# Patient Record
Sex: Male | Born: 1940
Health system: Southern US, Community
[De-identification: ages and names within clinical notes are randomized; demographics above are authoritative.]

## PROBLEM LIST (undated history)

## (undated) DIAGNOSIS — A389 Scarlet fever, uncomplicated: Secondary | ICD-10-CM

## (undated) DIAGNOSIS — I495 Sick sinus syndrome: Secondary | ICD-10-CM

## (undated) DIAGNOSIS — Z87442 Personal history of urinary calculi: Secondary | ICD-10-CM

## (undated) DIAGNOSIS — I4819 Other persistent atrial fibrillation: Secondary | ICD-10-CM

## (undated) DIAGNOSIS — H9190 Unspecified hearing loss, unspecified ear: Secondary | ICD-10-CM

## (undated) DIAGNOSIS — F419 Anxiety disorder, unspecified: Secondary | ICD-10-CM

## (undated) DIAGNOSIS — R413 Other amnesia: Secondary | ICD-10-CM

## (undated) DIAGNOSIS — Z889 Allergy status to unspecified drugs, medicaments and biological substances status: Secondary | ICD-10-CM

## (undated) DIAGNOSIS — K219 Gastro-esophageal reflux disease without esophagitis: Secondary | ICD-10-CM

## (undated) DIAGNOSIS — E0590 Thyrotoxicosis, unspecified without thyrotoxic crisis or storm: Secondary | ICD-10-CM

## (undated) DIAGNOSIS — K449 Diaphragmatic hernia without obstruction or gangrene: Secondary | ICD-10-CM

## (undated) DIAGNOSIS — E785 Hyperlipidemia, unspecified: Secondary | ICD-10-CM

## (undated) DIAGNOSIS — R0602 Shortness of breath: Secondary | ICD-10-CM

## (undated) DIAGNOSIS — Z8679 Personal history of other diseases of the circulatory system: Secondary | ICD-10-CM

## (undated) DIAGNOSIS — R319 Hematuria, unspecified: Secondary | ICD-10-CM

## (undated) DIAGNOSIS — R269 Unspecified abnormalities of gait and mobility: Secondary | ICD-10-CM

## (undated) DIAGNOSIS — J45909 Unspecified asthma, uncomplicated: Secondary | ICD-10-CM

## (undated) DIAGNOSIS — K4090 Unilateral inguinal hernia, without obstruction or gangrene, not specified as recurrent: Secondary | ICD-10-CM

## (undated) DIAGNOSIS — M1990 Unspecified osteoarthritis, unspecified site: Secondary | ICD-10-CM

## (undated) DIAGNOSIS — Z950 Presence of cardiac pacemaker: Secondary | ICD-10-CM

## (undated) DIAGNOSIS — I10 Essential (primary) hypertension: Secondary | ICD-10-CM

## (undated) DIAGNOSIS — I2510 Atherosclerotic heart disease of native coronary artery without angina pectoris: Secondary | ICD-10-CM

## (undated) DIAGNOSIS — I350 Nonrheumatic aortic (valve) stenosis: Secondary | ICD-10-CM

## (undated) HISTORY — DX: Sick sinus syndrome: I49.5

## (undated) HISTORY — PX: INSERT / REPLACE / REMOVE PACEMAKER: SUR710

## (undated) HISTORY — DX: Essential (primary) hypertension: I10

## (undated) HISTORY — DX: Unspecified hearing loss, unspecified ear: H91.90

## (undated) HISTORY — DX: Personal history of other diseases of the circulatory system: Z86.79

## (undated) HISTORY — DX: Unilateral inguinal hernia, without obstruction or gangrene, not specified as recurrent: K40.90

## (undated) HISTORY — DX: Hyperlipidemia, unspecified: E78.5

## (undated) HISTORY — DX: Atherosclerotic heart disease of native coronary artery without angina pectoris: I25.10

## (undated) HISTORY — DX: Nonrheumatic aortic (valve) stenosis: I35.0

## (undated) HISTORY — DX: Other amnesia: R41.3

## (undated) HISTORY — DX: Thyrotoxicosis, unspecified without thyrotoxic crisis or storm: E05.90

## (undated) HISTORY — PX: COLONOSCOPY W/ POLYPECTOMY: SHX1380

## (undated) HISTORY — DX: Other persistent atrial fibrillation: I48.19

## (undated) HISTORY — PX: DENTAL SURGERY: SHX609

## (undated) HISTORY — DX: Hematuria, unspecified: R31.9

## (undated) HISTORY — DX: Diaphragmatic hernia without obstruction or gangrene: K44.9

## (undated) HISTORY — DX: Unspecified abnormalities of gait and mobility: R26.9

---

## 1982-05-18 HISTORY — PX: KIDNEY STONE SURGERY: SHX686

## 2002-05-02 ENCOUNTER — Ambulatory Visit (HOSPITAL_COMMUNITY): Admission: RE | Admit: 2002-05-02 | Discharge: 2002-05-02 | Payer: Self-pay | Admitting: *Deleted

## 2004-04-28 ENCOUNTER — Encounter (INDEPENDENT_AMBULATORY_CARE_PROVIDER_SITE_OTHER): Payer: Self-pay | Admitting: *Deleted

## 2004-04-28 ENCOUNTER — Ambulatory Visit (HOSPITAL_COMMUNITY): Admission: RE | Admit: 2004-04-28 | Discharge: 2004-04-28 | Payer: Self-pay | Admitting: *Deleted

## 2004-05-18 HISTORY — PX: CORONARY ARTERY BYPASS GRAFT: SHX141

## 2004-05-18 HISTORY — PX: AORTIC VALVE REPLACEMENT: SHX41

## 2004-09-19 ENCOUNTER — Ambulatory Visit (HOSPITAL_COMMUNITY): Admission: RE | Admit: 2004-09-19 | Discharge: 2004-09-19 | Payer: Self-pay | Admitting: Cardiovascular Disease

## 2004-10-02 ENCOUNTER — Inpatient Hospital Stay (HOSPITAL_COMMUNITY): Admission: RE | Admit: 2004-10-02 | Discharge: 2004-10-15 | Payer: Self-pay | Admitting: Surgery

## 2004-10-02 ENCOUNTER — Encounter (INDEPENDENT_AMBULATORY_CARE_PROVIDER_SITE_OTHER): Payer: Self-pay | Admitting: Specialist

## 2004-11-03 ENCOUNTER — Encounter (HOSPITAL_COMMUNITY): Admission: RE | Admit: 2004-11-03 | Discharge: 2005-02-01 | Payer: Self-pay | Admitting: Cardiovascular Disease

## 2009-12-14 ENCOUNTER — Emergency Department (HOSPITAL_COMMUNITY): Admission: EM | Admit: 2009-12-14 | Discharge: 2009-12-14 | Payer: Self-pay | Admitting: Emergency Medicine

## 2009-12-23 ENCOUNTER — Ambulatory Visit: Payer: Self-pay | Admitting: Cardiovascular Disease

## 2009-12-23 ENCOUNTER — Emergency Department (HOSPITAL_COMMUNITY): Admission: EM | Admit: 2009-12-23 | Discharge: 2009-12-23 | Payer: Self-pay | Admitting: Emergency Medicine

## 2009-12-26 ENCOUNTER — Ambulatory Visit: Payer: Self-pay | Admitting: Cardiovascular Disease

## 2010-01-06 ENCOUNTER — Ambulatory Visit: Payer: Self-pay | Admitting: Cardiovascular Disease

## 2010-01-17 ENCOUNTER — Ambulatory Visit: Payer: Self-pay | Admitting: Cardiovascular Disease

## 2010-02-17 ENCOUNTER — Ambulatory Visit: Payer: Self-pay | Admitting: Cardiovascular Disease

## 2010-03-01 ENCOUNTER — Encounter: Payer: Self-pay | Admitting: Internal Medicine

## 2010-04-02 ENCOUNTER — Ambulatory Visit: Payer: Self-pay | Admitting: Cardiovascular Disease

## 2010-04-29 ENCOUNTER — Ambulatory Visit: Payer: Self-pay | Admitting: Cardiology

## 2010-06-04 DIAGNOSIS — K449 Diaphragmatic hernia without obstruction or gangrene: Secondary | ICD-10-CM | POA: Insufficient documentation

## 2010-06-04 DIAGNOSIS — E0590 Thyrotoxicosis, unspecified without thyrotoxic crisis or storm: Secondary | ICD-10-CM | POA: Insufficient documentation

## 2010-06-04 DIAGNOSIS — I495 Sick sinus syndrome: Secondary | ICD-10-CM | POA: Insufficient documentation

## 2010-06-04 DIAGNOSIS — R079 Chest pain, unspecified: Secondary | ICD-10-CM | POA: Insufficient documentation

## 2010-06-04 DIAGNOSIS — R0602 Shortness of breath: Secondary | ICD-10-CM | POA: Insufficient documentation

## 2010-06-04 DIAGNOSIS — I2510 Atherosclerotic heart disease of native coronary artery without angina pectoris: Secondary | ICD-10-CM | POA: Insufficient documentation

## 2010-06-04 DIAGNOSIS — B159 Hepatitis A without hepatic coma: Secondary | ICD-10-CM | POA: Insufficient documentation

## 2010-06-04 DIAGNOSIS — I359 Nonrheumatic aortic valve disorder, unspecified: Secondary | ICD-10-CM | POA: Insufficient documentation

## 2010-06-04 DIAGNOSIS — I10 Essential (primary) hypertension: Secondary | ICD-10-CM | POA: Insufficient documentation

## 2010-06-04 DIAGNOSIS — E785 Hyperlipidemia, unspecified: Secondary | ICD-10-CM | POA: Insufficient documentation

## 2010-06-05 ENCOUNTER — Encounter: Payer: Self-pay | Admitting: Internal Medicine

## 2010-06-05 ENCOUNTER — Ambulatory Visit
Admission: RE | Admit: 2010-06-05 | Discharge: 2010-06-05 | Payer: Self-pay | Source: Home / Self Care | Attending: Internal Medicine | Admitting: Internal Medicine

## 2010-06-08 ENCOUNTER — Encounter: Payer: Self-pay | Admitting: Surgery

## 2010-06-10 ENCOUNTER — Ambulatory Visit: Payer: Self-pay | Admitting: Cardiovascular Disease

## 2010-06-17 NOTE — Miscellaneous (Signed)
Summary: Device preload  Clinical Lists Changes  Observations: Added new observation of PPM INDICATN: Sick sinus syndrome (03/01/2010 16:10) Added new observation of MAGNET RTE: BOL 85 ERI 65 (03/01/2010 16:10) Added new observation of PPMLEADSTAT2: active (03/01/2010 16:10) Added new observation of PPMLEADSER2: [id number] (03/01/2010 16:10) Added new observation of PPMLEADMOD2: 5076  (03/01/2010 16:10) Added new observation of PPMLEADDOI2: 08/10/2004  (03/01/2010 16:10) Added new observation of PPMLEADLOC2: RV  (03/01/2010 16:10) Added new observation of PPMLEADSTAT1: active  (03/01/2010 16:10) Added new observation of PPMLEADSER1: [id number]  (03/01/2010 16:10) Added new observation of PPMLEADMOD1: 5076  (03/01/2010 16:10) Added new observation of PPMLEADDOI1: 08/10/2004  (03/01/2010 16:10) Added new observation of PPMLEADLOC1: RA  (03/01/2010 16:10) Added new observation of PPM IMP MD: Rexanne Mano, MD  (03/01/2010 16:10) Added new observation of PPM DOI: 08/10/2004  (03/01/2010 16:10) Added new observation of PPM SERL#: [id number]  (03/01/2010 16:10) Added new observation of PPM MODL#: [id number]  (03/01/2010 16:10) Added new observation of PACEMAKERMFG: Medtronic  (03/01/2010 16:10) Added new observation of PPM REFER MD: Shaune Pascal, MD  (03/01/2010 16:10) Added new observation of PACEMAKER MD: Hillis Range, MD  (03/01/2010 16:10)      PPM Specifications Following MD:  Hillis Range, MD     Referring MD:  Shaune Pascal, MD PPM Vendor:  Medtronic     PPM Model Number:  [id number]     PPM Serial Number:  [id number] PPM DOI:  08/10/2004     PPM Implanting MD:  Rexanne Mano, MD  Lead 1    Location: RA     DOI: 08/10/2004     Model #: [id number]     Serial #: [id number]     Status: active Lead 2    Location: RV     DOI: 08/10/2004     Model #: [id number]     Serial #: [id number]     Status: active  Magnet Response Rate:  BOL 85 ERI 65  Indications:  Sick sinus syndrome

## 2010-06-19 NOTE — Assessment & Plan Note (Signed)
Summary: pacer check/medtronic   Referring Provider:  Dr Elease Hashimoto   History of Present Illness: The patient presents today to establish care in the pacemaker clinic.  He is previously s/p PPM by Dr Laneta Simmers 2006 following aortic valve replacement for sinus node dysfunction at that time.  He reports doing very well recently.  He remains very active and continues to do his own yard work.  He has a R sided tremor chronically which is his primary concern.  The patient denies symptoms of palpitations, chest pain, shortness of breath, orthopnea, PND, lower extremity edema, dizziness, presyncope, syncope, or neurologic sequela. The patient is tolerating medications without difficulties and is otherwise without complaint today.   Current Medications (verified): 1)  Synthroid 125 Mcg Tabs (Levothyroxine Sodium) .Marland Kitchen.. 1 By Mouth Daily 2)  Coumadin 5 Mg Tabs (Warfarin Sodium) .... Uad 3)  Lipitor 20 Mg Tabs (Atorvastatin Calcium) .Marland Kitchen.. 1 By Mouth Daily 4)  Metoprolol Succinate 50 Mg Xr24h-Tab (Metoprolol Succinate) .Marland Kitchen.. 1 By Mouth Daily 5)  Aspirin 81 Mg  Tabs (Aspirin) .Marland Kitchen.. 1 By Mouth Dialy 6)  Primidone 50 Mg Tabs (Primidone) .Marland Kitchen.. 1 By Mouth Daily  Allergies (verified): No Known Drug Allergies  Past History:  Past Medical History: sick sinus syndrome s/p PPM 2006 (MDT) by Dr Laneta Simmers atrial fibrillation HYPERLIPIDEMIA  HYPERTENSION  AORTIC STENOSIS s/p AVR rheumatic fever CAD  HIATAL HERNIA HYPERTHYROIDISM History of right kidney stone. decreased hearing  Past Surgical History: CAD s/p CABG and AVR 2006 by Dr Laneta Simmers  Family History: CAD  Social History:  The patient lives in [place] with his spouse.  He does not smoke and does not drink alcohol.    Review of Systems       All systems are reviewed and negative except as listed in the HPI.   Vital Signs:  Patient profile:   70 year old male Height:      71 inches Weight:      206 pounds BMI:     28.84 Pulse rate:   60 /  minute Resp:     18 per minute BP sitting:   128 / 78  (right arm)  Vitals Entered By: Marrion Coy, CNA (June 05, 2010 9:51 AM)  Physical Exam  General:  Well developed, well nourished, in no acute distress. Head:  normocephalic and atraumatic Eyes:  PERRLA/EOM intact; conjunctiva and lids normal. Mouth:  Teeth, gums and palate normal. Oral mucosa normal. Neck:  supple Chest Wall:  pacemaker pocket well healed (R sided) Lungs:  Clear bilaterally to auscultation and percussion. Heart:  RRR, mechanical S2 Abdomen:  Bowel sounds positive; abdomen soft and non-tender without masses, organomegaly, or hernias noted. No hepatosplenomegaly. Msk:  Back normal, normal gait. Muscle strength and tone normal. Extremities:  No clubbing or cyanosis. Neurologic:  profound resting tremor of R arm > R leg Skin:  Intact without lesions or rashes. Cervical Nodes:  no significant adenopathy Psych:  Normal affect.   PPM Specifications Following MD:  Hillis Range, MD     Referring MD:  Shaune Pascal, MD PPM Vendor:  Medtronic     PPM Model Number:  [id number]     PPM Serial Number:  [id number] PPM DOI:  08/10/2004     PPM Implanting MD:  Rexanne Mano, MD  Lead 1    Location: RA     DOI: 08/10/2004     Model #: 1914     Serial #: [id number]     Status: active Lead 2  Location: RV     DOI: 08/10/2004     Model #: 1610     Serial #: [id number]     Status: active  Magnet Response Rate:  BOL 85 ERI 65  Indications:  Sick sinus syndrome   PPM Follow Up Battery Voltage:  2.99 V       PPM Device Measurements Atrium  Amplitude: 3.0 mV, Impedance: 504 ohms, Threshold: 1.0 V at 0.4 msec Right Ventricle  Amplitude: 8.0 mV, Impedance: 424 ohms, Threshold: 1.0 V at 0.4 msec  Episodes MS Episodes:  0     Ventricular High Rate:  0     Atrial Pacing:  61.8%     Ventricular Pacing:  0.2%  Parameters Mode:  MVP     Lower Rate Limit:  60     Upper Rate Limit:  130 Paced AV Delay:  200     Sensed AV  Delay:  150 Next Remote Date:  09/04/2010     Next Cardiology Appt Due:  05/19/2011 Tech Comments:  NORMAL DEVICE FUNCTION.  NO EPISODES SINCE LAST CHECK.  HISTOGRAM A LITTLE FLAT BUT PT FEELS GOOD W/EXERCISE.  NO FATIGUE OR SOB.  CHANGED RV OUTPUT FROM 2.0 TO 2.5 V.  PT TO BE ENROLLED IN CARELINK.  CARELINK TRANSMISSION 09-04-10 AND ROV W/JA IN 12 MTHS. Kristin Bankston  June 05, 2010 10:00 AM MD Comments:  agree  Impression & Recommendations:  Problem # 1:  SICK SINUS SYNDROME (ICD-427.81)  normal pacemaker function no changes  His updated medication list for this problem includes:    Coumadin 5 Mg Tabs (Warfarin sodium) ..... Uad    Metoprolol Succinate 50 Mg Xr24h-tab (Metoprolol succinate) .Marland Kitchen... 1 by mouth daily    Aspirin 81 Mg Tabs (Aspirin) .Marland Kitchen... 1 by mouth dialy  Problem # 2:  HYPERTENSION (ICD-401.9)  stable  His updated medication list for this problem includes:    Metoprolol Succinate 50 Mg Xr24h-tab (Metoprolol succinate) .Marland Kitchen... 1 by mouth daily    Aspirin 81 Mg Tabs (Aspirin) .Marland Kitchen... 1 by mouth dialy  Problem # 3:  HYPERLIPIDEMIA (ICD-272.4)  stable  His updated medication list for this problem includes:    Lipitor 20 Mg Tabs (Atorvastatin calcium) .Marland Kitchen... 1 by mouth daily  Problem # 4:  CAD (ICD-414.00) no ischemic symptoms no change  Patient Instructions: 1)  Your physician recommends that you schedule a follow-up appointment in: 12 months with Dr. Johney Frame 2)  Your physician recommends that you continue on your current medications as directed. Please refer to the Current Medication list given to you today.

## 2010-07-03 NOTE — Cardiovascular Report (Signed)
Summary: Office Visit   Office Visit   Imported By: Roderic Ovens 06/25/2010 10:17:16  _____________________________________________________________________  External Attachment:    Type:   Image     Comment:   External Document

## 2010-07-11 ENCOUNTER — Other Ambulatory Visit (INDEPENDENT_AMBULATORY_CARE_PROVIDER_SITE_OTHER): Payer: BC Managed Care – PPO

## 2010-07-11 DIAGNOSIS — Z7901 Long term (current) use of anticoagulants: Secondary | ICD-10-CM

## 2010-07-11 DIAGNOSIS — I495 Sick sinus syndrome: Secondary | ICD-10-CM

## 2010-07-21 ENCOUNTER — Telehealth (INDEPENDENT_AMBULATORY_CARE_PROVIDER_SITE_OTHER): Payer: Self-pay | Admitting: *Deleted

## 2010-07-29 NOTE — Progress Notes (Signed)
Summary: question transmitting over the phone  Phone Note Call from Patient Call back at Home Phone [phone]   Caller: Spouse Reason for Call: Talk to Nurse Summary of Call: pt wife has question re transmitting over the phone. Initial call taken by: Roe Coombs,  July 21, 2010 11:02 AM  Follow-up for Phone Call        Patient has not recieved transmitter.  Re-ordered  today. Follow-up by: Altha Harm, LPN,  July 21, 2010 4:59 PM

## 2010-08-08 ENCOUNTER — Other Ambulatory Visit (INDEPENDENT_AMBULATORY_CARE_PROVIDER_SITE_OTHER): Payer: BC Managed Care – PPO | Admitting: *Deleted

## 2010-08-08 DIAGNOSIS — Z7901 Long term (current) use of anticoagulants: Secondary | ICD-10-CM

## 2010-08-08 DIAGNOSIS — I495 Sick sinus syndrome: Secondary | ICD-10-CM

## 2010-08-08 NOTE — Patient Instructions (Signed)
CONT. SAME DOSE; RECK IN 4 WEEKS;  LEFT VM ON PTS HOME #

## 2010-08-11 ENCOUNTER — Encounter: Payer: BC Managed Care – PPO | Admitting: *Deleted

## 2010-09-04 ENCOUNTER — Other Ambulatory Visit: Payer: Self-pay | Admitting: Internal Medicine

## 2010-09-04 ENCOUNTER — Ambulatory Visit (INDEPENDENT_AMBULATORY_CARE_PROVIDER_SITE_OTHER): Payer: BC Managed Care – PPO | Admitting: *Deleted

## 2010-09-04 DIAGNOSIS — I495 Sick sinus syndrome: Secondary | ICD-10-CM

## 2010-09-08 ENCOUNTER — Encounter: Payer: BC Managed Care – PPO | Admitting: *Deleted

## 2010-09-09 ENCOUNTER — Ambulatory Visit (INDEPENDENT_AMBULATORY_CARE_PROVIDER_SITE_OTHER): Payer: BC Managed Care – PPO | Admitting: *Deleted

## 2010-09-09 DIAGNOSIS — I359 Nonrheumatic aortic valve disorder, unspecified: Secondary | ICD-10-CM

## 2010-09-09 DIAGNOSIS — I350 Nonrheumatic aortic (valve) stenosis: Secondary | ICD-10-CM

## 2010-09-12 NOTE — Progress Notes (Signed)
Pacer remote check  

## 2010-09-14 ENCOUNTER — Encounter: Payer: Self-pay | Admitting: *Deleted

## 2010-09-15 ENCOUNTER — Telehealth: Payer: Self-pay | Admitting: Cardiovascular Disease

## 2010-09-15 NOTE — Telephone Encounter (Signed)
Spoke with pt's wife.  Ropinirole (Requip) has the potential to increase INR.  Pt started medication on 4/28.  Appt made to recheck INR in 5/4.

## 2010-09-15 NOTE — Telephone Encounter (Signed)
Went to neurologist on Friday he was diagnosed with parkinson, they have started him on some new meds.  ropinirole hcl .25mg  1 tablet 3 times a day for 2 weeks, 2 tablets  3 times a day. Low dose bendryl 3 times a day When should he come back in for the INR check. He does have appointment on the 22  But they will need to change it.

## 2010-09-18 ENCOUNTER — Other Ambulatory Visit: Payer: Self-pay | Admitting: *Deleted

## 2010-09-18 DIAGNOSIS — I350 Nonrheumatic aortic (valve) stenosis: Secondary | ICD-10-CM

## 2010-09-19 ENCOUNTER — Telehealth: Payer: Self-pay | Admitting: *Deleted

## 2010-09-19 ENCOUNTER — Other Ambulatory Visit: Payer: Self-pay | Admitting: Cardiovascular Disease

## 2010-09-19 ENCOUNTER — Ambulatory Visit (INDEPENDENT_AMBULATORY_CARE_PROVIDER_SITE_OTHER): Payer: BC Managed Care – PPO | Admitting: *Deleted

## 2010-09-19 DIAGNOSIS — I350 Nonrheumatic aortic (valve) stenosis: Secondary | ICD-10-CM

## 2010-09-19 DIAGNOSIS — I359 Nonrheumatic aortic valve disorder, unspecified: Secondary | ICD-10-CM

## 2010-09-19 NOTE — Telephone Encounter (Signed)
LM to call back regarding INR

## 2010-10-02 ENCOUNTER — Ambulatory Visit (INDEPENDENT_AMBULATORY_CARE_PROVIDER_SITE_OTHER): Payer: BC Managed Care – PPO | Admitting: *Deleted

## 2010-10-02 DIAGNOSIS — I359 Nonrheumatic aortic valve disorder, unspecified: Secondary | ICD-10-CM

## 2010-10-02 DIAGNOSIS — I350 Nonrheumatic aortic (valve) stenosis: Secondary | ICD-10-CM

## 2010-10-03 NOTE — Op Note (Signed)
[name], Donald [name] NO.:  [id number]   MEDICAL RECORD NO.:  [id number]          PATIENT TYPE:  INP   LOCATION:  2303                         FACILITY:  [facility]   PHYSICIAN:  Bedelia Person, M.D.        DATE OF BIRTH:  November 22, 1940   DATE OF PROCEDURE:  10/02/2004  DATE OF DISCHARGE:                                 OPERATIVE REPORT   PROCEDURE:  Intraoperative transesophageal echocardiography.   [name] is a 70 year old male with known aortic stenosis and coronary  artery disease scheduled for aortic valve repair or replacement and coronary  artery bypass grafting.  The TEE will be used intraoperatively to assess the  extent of damage to the intrinsic aortic valve as well as the repair or  replacement.  The patient was induced with general anesthesia and oral  endotracheal tube was placed to secure the airway.  The transesophageal  probe was then placed in the sleeve, heavily lubricated and passed down the  oropharynx to the 45 cm mark that remained throughout the case obtaining  various Omniplane views.  The prebypass examination revealed left ventricle  to be slightly thickened.  Contractility was slightly hypokinetic overall.  There were no segmental defects.  Left atrium was normal size.  The  appendage was clean.  The mitral valve had two leaflets, opened and closed  appropriately.  Leaflets were thin.  No calcium was noted.  Color Doppler  showed 1+ central mitral regurgitant flow.  The aortic valve was heavily  calcified.  The noncoronary cusp appeared to be moving freely.  The right  and left coronary cusps appeared fused.  There was heavy annular  calcification.  There was an insignificant amount of aortic insufficiency.  The right heart exam was essentially normal with trace tricuspid  regurgitation with Swan-Ganz catheter across the valve.  Patient was placed  on cardiopulmonary bypass and underwent aortic valve replacement with  Carpentier-Edwards  mechanical valve and coronary artery bypass grafting x2.  At the completion of the bypass procedure, there were numerous air bubbles  which were cleared with the left ventricular vent.  The post bypass  examination revealed the left ventricle to be initially slightly sluggish.  This improved to a normal prebypass examination as time passed after bypass.  There were no segmental defects detected.  The mitral valve was again  exhibiting 1+ central mitral regurgitant flow, otherwise unchanged from  prebypass exam.  The prosthetic aortic valve appeared to be well seated in  the aortic position.  There was no significant aortic insufficiency.  Two  leaflets could be seen opening and closing appropriately.  There were no  paravalvular leaks detected on color Doppler.  The septum was intact.  Right  heart exam was unchanged from prebypass.      LK/MEDQ  D:  10/02/2004  T:  10/02/2004  Job:  [id number]

## 2010-10-03 NOTE — Op Note (Signed)
[name], [name] NO.:  [id number]   MEDICAL RECORD NO.:  [id number]          PATIENT TYPE:  INP   LOCATION:  2029                         FACILITY:  [facility]   PHYSICIAN:  Evelene Croon, M.D.     DATE OF BIRTH:  21-Jul-1940   DATE OF PROCEDURE:  10/10/2004  DATE OF DISCHARGE:                                 OPERATIVE REPORT   POSTOPERATIVE DIAGNOSIS:  Six sinus syndrome.   OPERATIVE PROCEDURE:  Insertion of permanent dual chamber pacemaker.   ATTENDING SURGEON:  Evelene Croon, M.D.   ANESTHESIA:  MAC with local.   CLINICAL HISTORY:  This patient is 70 year old gentleman who is about eight  days status post aortic valve replacement with mechanical valve and coronary  bypass surgery.  He has had sick sinus syndrome postoperatively with  junctional bradycardia as well as some atrial fibrillation, atrial flutter  treated with amiodarone.  We have been watching his rhythm and normal sinus  rhythm has not returned.  After a discussion with Dr. Ninfa Meeker, it was felt  that insertion of permanent dual-chamber pacemaker would be the best  treatment to prevent symptomatic bradycardia.  I discussed the operative  procedure with the patient and his wife including alternatives, benefits,  risks including bleeding, injury the lung or subclavian vessels, infection,  malfunction of the pacemaker, pneumothorax, and they understood and agreed  to proceed.   OPERATIVE PROCEDURE:  The patient was taken off the room, placed on the  table in the supine position. He had been given preoperative intravenous  Ancef.  Then after intravenous sedation by anesthesia the chest was prepped  with Betadine soap and solution and draped in the usual sterile manner.  A  transverse incision was made in the right infraclavicular  region and  carried down through the subcutaneous tissues with electrocautery.  Local  anesthesia we used was 1% lidocaine with epinephrine since the patient has  been on  Coumadin.  I thought this would decrease bleeding.  Then a  subcutaneous pocket was developed below this incision for the pacemaker  generator.  Then the right subclavian vein was cannulated with a needle.  The subclavian vein was somewhat difficult to localize.  Then a guidewire  was advanced to the through the needle under fluoroscopic guidance into the  right side the heart.  Then the subclavian vein was cannulated a second time  with a needle and another guidewire vein from the right side of the heart  under fluoroscopic guidance.  Then over the first wire a 7-French introducer  was inserted under fluoroscopic guidance.  Through this a Medtronic  ventricular pacing lead, model number 5076 -58, serial number [id number] was  inserted.  This was a 58 cm lead with an active fixation set.  It was  positioned with the tip right ventricular apex.  It was tested and had an  impedence of 72 ohms.  HMS. Voltage was 0.5 and a current of 1 mA.  R-wave  sensing was 12.1 mV.  This lead was then screwed into place and tested again  and was stable.  It  was fixed to the pectoralis fascia using the suture  sleeve. Then the second guidewire, another 7-French introducer was inserted  under fluoroscopic guidance and through this a Medtronic active fixation  atrial lead was inserted.  This had model number [id number], serial number  [id number] as in victor  This lead was positioned with the tip against the  lateral right temperature against the lateral right atrial wall. The patient  had done an atrial flutter wire while we were putting this lead in and  therefore we could not pace were previously an and therefore we could not  pace but the P-wave sensing was 3.0 mV and impedance was 638 also HMS. T his  lead was felt to satisfactory and was affixed to the pectoralis six the  pectoralis fascia using the suture sleeve.  Then both leads were connected  to a Medtronic pacemaker generator model number [id number],  serial number  [id number] and Renae Fickle and an ACT is a pulse [id number].  The generator was placed  in the subcutaneous pocket. This complete hemostasis.  The subcutaneous C  fascia was closed with continuous 2-0 Vicryl and skin with 3-0 Vicryl.   CUTICULAR:  Closure. The sponge, needles and counts correct corresponds. Dry  sterile dressing applied and the incisions , around the chest tubes which  were Pleur-Evac suction. The patient remained hemodynamically stable and was  transfer to post anesthesia care unit in good condition. The sling in the  fascia.       BB/MEDQ  D:  10/10/2004  T:  10/11/2004  Job:  [id number]   cc:   CVTS Office   Vesta Mixer, M.D.  [address]  [place]  [place] [address]  Fax: [phone]   CVTS office

## 2010-10-03 NOTE — Cardiovascular Report (Signed)
NAME:  Donald [name], Donald [name] NO.:  [id number]   MEDICAL RECORD NO.:  [id number]          PATIENT TYPE:  OIB   LOCATION:  2899                         FACILITY:  [facility]   PHYSICIAN:  Vesta Mixer, M.D. DATE OF BIRTH:  01/13/1941   DATE OF PROCEDURE:  09/19/2004  DATE OF DISCHARGE:                              CARDIAC CATHETERIZATION   Donald [name] is a 70 year old gentleman with a history of mild aortic stenosis  in the past.  He was referred to our office for progressive shortness breath  over the past several weeks.  He had a stress Cardiolite study which was  borderline positive.  He had deep ST segment depressions during the stress  test but the cineangiographic images revealed no evidence of ischemia.  He  had a mildly depressed left ventricular systolic function with an ejection  fraction of 49%.  He was referred for heart catheterization based on these  results.   PROCEDURE:  Right left heart catheterization with coronary angiography.   The right femoral artery and right femoral vein were easily cannulated using  a modified Seldinger technique.  A right heart catheter was placed.  We  performed Fick cardiac outputs and thermodilution cardiac outputs in the  standard fashion.   HEMODYNAMICS:  The RA pressure is 6.  RV pressure is 44/10.  Pulmonary  capillary wedge pressure is a mean of 13.  Pulmonary artery pressure is  36/10 with a mean of 24.  His LV pressure was 183/21 with an aortic pressure  of 140/69.  During the aortic valve pullback the LV pressure was 190/24 with  an aortic pressure of 141/71.   Cardiac output by Fick is 5.5 L/minute.  The cardiac output by  thermodilution is 7 L/minute.  The aortic valve area by Fick cardiac output  is 0.76.  Aortic valve area by thermodilution is 0.97.   ANGIOGRAPHY:   LEFT VENTRICULOGRAM:  The left ventriculogram was performed in the 30 [name]  position.  It reveals a the left ventricular systolic function that is  at  the lower limits of normal.  The EF is approximately 50%.  The aortic valve  is seen to be very heavily calcified.  It was fairly difficult to cross the  valve and the valve was crossed using an Amplatz left 2 catheter.   Left main:  The left main coronary artery is fairly normal.   Left anterior descending artery:  There is a 50% shelf-like stenosis in the  proximal LAD.  The remainder of the LAD has only minor luminal  irregularities.  There is several moderate sized diagonal diagonal branches  which are unremarkable.   The left circumflex artery is a large and dominant vessel.  There is a  napkin ring stenosis of about 60% in the mid/distal left circumflex artery  just after the takeoff of the first obtuse marginal artery.  This stenosis  is somewhat difficult to assess but it is at least 60%.   The posterior descending artery and the posterolateral segment artery have  only minor luminal irregularities.  The first obtuse marginal artery  has  minor stenosis at the origin, but is otherwise unremarkable.   The right coronary artery is small and nondominant.  It supplies RV marginal  branches only.   COMPLICATIONS:  None.   CONCLUSION:  1.  Moderate coronary artery disease.  His coronary arteries have stenoses      primarily in the proximal, left anterior descending, and circumflex      artery.  2.  Severe aortic stenosis with an estimated aortic valve area of between      0.76 and 0.97.  He will quite likely need aortic valve replacement and I      suspect that we will want to go ahead and placed bypass grafts to his      left anterior descending and left circumflex artery.  We will discuss      the case with the CVTS.      PJN/MEDQ  D:  09/19/2004  T:  09/19/2004  Job:  [id number]   cc:   Evelene Croon, M.D.  [address]  [place]  [place] [address]  Fax: [phone]   Gaspar Garbe, M.D.  [address]  [place]  [place] [address]  Fax: [phone]

## 2010-10-03 NOTE — Op Note (Signed)
NAMEDARIS, HARKINS NO.:  [id number]   MEDICAL RECORD NO.:  [id number]          PATIENT TYPE:  INP   LOCATION:  2303                         FACILITY:  [facility]   PHYSICIAN:  Evelene Croon, M.D.     DATE OF BIRTH:  1940-08-26   DATE OF PROCEDURE:  10/02/2004  DATE OF DISCHARGE:                                 OPERATIVE REPORT   PREOPERATIVE DIAGNOSES:  1.  Severe aortic stenosis.  2.  Two-vessel coronary disease.   POSTOPERATIVE DIAGNOSES:  1.  Severe aortic stenosis.  2.  Two-vessel coronary disease.   PROCEDURE:  Median sternotomy, extracorporeal circulation, coronary bypass  graft surgery x3 using a left internal mammary artery graft to left anterior  descending coronary, with a sequential saphenous vein graft to the obtuse  marginal and posterior descending branch of the left circumflex coronary.  Aortic valve replacement using a 25-mm St. Jude mechanical valve. Endoscopic  vein harvesting from the right leg.   ATTENDING SURGEON:  Evelene Croon, M.D.   ASSISTANT:  Salvatore Decent. Dorris Fetch, M.D.   SECOND ASSISTANT:  Ammie Ferrier, P.A.-C.   ANESTHESIA:  General endotracheal.   CLINICAL HISTORY:  This patient is a 70 year old gentleman who has been in  previously good health and recently presented with new onset of substernal  chest discomfort and shortness of breath. This typically occurred with heavy  exertion. He underwent Cardiolite stress test which showed 2-3 mm of ST  depression in the inferior and lateral leads with peak exercise. The  Cardiolite images showed attenuation of the anterior wall with normal uptake  in the remaining areas. Ejection fraction was 49%. He also had a known  history of mild aortic stenosis. He underwent cardiac catheterization on Sep 19, 2004 which showed severe aortic stenosis with an aortic valve area 0.76  to 0.97 cm2 and an aortic valve gradient of 49. Coronary angiography showed  a 50% shelf-like stenosis in the  proximal LAD. Left circumflex was a large  dominant vessel that had 60% mid vessel stenosis at the takeoff of the first  obtuse marginal branch. The right coronary was a small nondominant vessel.  Ejection fraction was 50% with a heavily calcified aortic valve. After  review of the angiogram and examination the patient, it was felt that  coronary bypass graft surgery and aortic valve replacement was the best  treatment. I discussed the operative procedure with the patient and his wife  including alternatives, benefits, and risks including bleeding, blood  transfusion, infection, stroke, myocardial infarction, graft failure, and  death. We also discussed the options for valve replacement including  mechanical and tissue valve. I have discussed the pros and cons of both. The  patient decided that he would rather have a mechanical valve which I think  is a good choice at his age. He understood the need for lifelong  anticoagulation with Coumadin and the slightly increased risk of bleeding  and thromboembolism with mechanical valve over his lifetime. He understood  all this and agreed to proceed.   OPERATIVE PROCEDURE:  The patient was taken to the operating room  and placed  on the table in supine position. After induction of general endotracheal  anesthesia, a Foley catheter was placed in the bladder using sterile  technique. Then, the chest, abdomen and both lower extremities were prepped  and draped in usual sterile manner. Transesophageal echocardiogram was  performed showed a functionally bicuspid valve. There was marked  calcification of the aortic valve and severe stenosis. There was no  significant mitral regurgitation. Left ventricular function appeared well-  preserved.   Then, the chest was opened through a median sternotomy incision and the  pericardial opened in midline. Examination of the heart showed good  ventricular contractility. The ascending aorta had no palpable plaques  in  it.   The left internal mammary artery was harvested from the chest wall as a  pedicle graft. This is a medium caliber vessel with excellent blood flow  through it. At the same time, a segment of greater saphenous vein was  harvested from the right leg using endoscopic vein harvest technique. This  vein was of large caliber and good quality.   Then the patient was heparinized, and when an adequate __________ was  achieved, the distal ascending aorta was cannulated using 20-French aortic  cannula for arterial inflow. Venous outflow was achieved using a two-stage  venous cannula through the right atrial appendage. Antegrade cardioplegia  and vent cannula was inserted in the aortic root. A left ventricular vent  was placed through the right superior pulmonary vein and a retrograde  cardioplegia cannula inserted through the right atrium.   The patient placed on cardiopulmonary bypass and distal coronaries  identified. The LAD was a large vessel. The first marginal branch was a  large vessel. The left circumflex terminated to two distal branches, one  being a posterolateral branch and the most distal one being the posterior  descending artery. The posterior descending was a medium size vessel. Both  of these branches had no significant disease in them. The right coronary  artery was a small nondominant vessel that really only supplied the right  ventricle and into the acute margin.   Then, the aorta was cross clamped and 500 cc of cold blood antegrade  cardioplegia was administered in the aortic root with quick arrest of the  heart. Systemic hypothermia to 20 degrees centigrade and topical __________  was used. Temperature probe placed in the septum and inside the pad in the  pericardium. The first distal anastomosis was performed to the obtuse  marginal branch. The internal diameter is 1.75 mm. Conduit used was a  segment of greater saphenous vein. Anastomosis performed in a  sequential side-to-side manner using continuous 7-0 Prolene suture. Flow was noted  through the graft and was excellent.   The second distal anastomosis was performed of the posterior descending  branch of the left circumflex coronary artery. The internal diameter was  about 1.6 mm. Conduit used was the same segment of greater saphenous vein,  anastomosis performed in a sequential end-to-side manner using continuous 7-  0 Prolene suture. Flow was noted through the graft and was excellent.  Another dose of cardioplegia given down the vein graft and aortic root.   The third distal anastomosis was performed in the mid portion of left  anterior descending coronary artery. The internal diameter was about 2 mm.  Conduit used was the left internal mammary graft and was brought through an  opening in the left pericardium and anterior to the phrenic nerve.  Anastomosed the LAD in end-to-side manner using  a continuous 8-0 Prolene  suture. The pedicle was sutured to the epicardium with 6-0 Prolene sutures.   Then, attention was turned to the aortic valve replacement. Throughout the  remainder of the procedure, doses of retrograde cardioplegia were given in  about 30-minute intervals to maintain myocardial temperature around 10  degrees centigrade. The aorta was then opened transversely valve about 1 cm  above the takeoff of the right coronary artery. Examination of the native  valve showed that there were three leaflets with complete fusion of the  commissure between the right and left cusp and partial fusion of the  commissure between the right and noncoronary cusp. There was severe  stenosis. The native valve was excised, taking care to remove all  particulate debris. Then, the annulus was decalcified using rongeurs. The  left ventricle and aortic root were irrigated with iced saline solution.  Care was taken to remove all particulate debris. Annulus was sized, and a 25-  mm St. Jude mechanical  valve was chosen. This had model #25AJ-501, serial  [id number]. Then a series of pledgeted 2-0 Ethibond horizontal mattress  sutures were placed around the annulus with the pledgets in a subannular  position. The sutures were placed through the sewing ring and the valve  lowered into place. The sutures were tied sequentially. The valve seated  nicely and in the leaflets moved normally without obstruction. Then the  patient rewarmed to 37 degrees centigrade. The right and left coronary ostia  were not obstructed. The aorta was then closed in two layers using  continuous 4-0 Prolene suture. Then the single proximal vein graft  anastomosis was performed of the aortic root in end-to-side manner using  continuous 6-0 Prolene suture. After de-airing maneuvers were performed on  the left heart, the head was placed in Trendelenburg position and the  crossclamp removed at a time of 112 minutes. There was spontaneous return of  sinus rhythm. Proximal and distal anastomosis appeared hemostatic while the graft satisfactory. Graft markers placed around the proximal anastomosis.  Two temporary right ventricular and right atrial pacing wires placed and  brought out through the skin.   When the patient had rewarmed to 37 degrees centigrade, he was weaned from  cardiopulmonary bypass __________. Total bypass time was 137 minutes.  Cardiac function appeared excellent with a cardiac output of 6 liters per  minute. A transesophageal echocardiogram showed normal functioning  mechanical aortic valve prosthesis without regurgitation or perivalvular  leak. There was no mitral regurgitation. Left ventricular function appeared  well-preserved. Protamine was given and then the venous and aortic cannulas  were removed without difficulty. Hemostasis was achieved. Three chest tubes  were placed, with tube in the post pericardium, one left pleural space, and  one in the anterior mediastinum. The pericardium was  reapproximated over the  heart. Sternum was closed with #6 stainless steel wires. Fascia was closed  with continuous #1 Vicryl suture. The subcutaneous tissue was closed with  continuous 2-0 Vicryl and the skin with 3-0 Vicryl subcuticular closure. The  lower extremity vein harvest site was closed in layers in a similar manner.  The sponge, needles and instrument counts were correct according to the  scrub nurse. Dry sterile dressing applied over the incision and around the  chest tubes which were hooked to Pleur-Evac suction. The patient remained  hemodynamically stable and transferred to the SICU in guarded but stable  condition.       BB/MEDQ  D:  10/02/2004  T:  10/02/2004  Job:  [id number]   cc:   Vesta Mixer, M.D.  [address]  [place]  [place] [address]  Fax: [phone]   Cardiac cath lab  [facility]

## 2010-10-03 NOTE — H&P (Signed)
NAME:  Donald [name], Donald [name] NO.:  [id number]   MEDICAL RECORD NO.:  [id number]          PATIENT TYPE:   LOCATION:                                 FACILITY:   PHYSICIAN:  Vesta Mixer, M.D. DATE OF BIRTH:  24-Dec-1940   DATE OF ADMISSION:  09/19/2004  DATE OF DISCHARGE:                                HISTORY & PHYSICAL   HISTORY OF PRESENT ILLNESS:  Donald [name] is a middle-aged gentleman with a  history of some chest pain and some shortness of breath. He has multiple  risk factors for coronary artery disease and he was referred for a stress  Cardiolite study. He is now referred for a heart catheterization because of  an abnormal stress Cardiolite study.   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 He has had some mild  episodes of chest pain and shortness of breath. He has a history of  hyperlipidemia and also has a history of hypertension.   Because of some mild symptoms of chest pain and shortness of breath, he was  referred for stress Cardiolite study. The Cardiolite revealed a mildly  depressed left ventricular systolic function with an ejection fraction of  49%. During the stress portion of the stress test, he had 2 mm of ST segment  depression. These ST segment changes resolved after several minutes into the  recovery phase. He had no clear cut evidence of ischemia but he had some  homogenous changes on his sinographic images.   He denies any syncope or presyncope. He denies any real chest pain-like  angina but he does have some shortness of breath.   CURRENT MEDICATIONS:  1.  Aspirin 81 mg daily.  2.  Ziac 2.5 mg/6.25 mg daily.  3.  Hydrochlorothiazide 1 daily.  4.  Synthroid 0.125 mg daily.  5.  Lipitor 10 mg daily.  6.  Doxycycline 100 mg daily.  7.  Nitroglycerin p.r.n.   ALLERGIES:  None.   PAST MEDICAL HISTORY:  1.  Hypertension.  2.  Hypothyroidism.  3.  Hyperlipidemia.   SOCIAL HISTORY:  The patient does not smoke and does  not drink alcohol. 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   FAMILY HISTORY:  Noncontributory.   REVIEW OF SYMPTOMS:  Reviewed and is essentially negative.   PHYSICAL EXAMINATION:  GENERAL:  He is a middle-aged gentleman in no acute  distress. He is alert and oriented x 3 and his mood and affect are normal.  VITAL SIGNS:  His weight is 226. His blood pressure is 120/90 with a heart  rate of 72.  HEENT:  There are 2+ carotids. He has no bruits. No JVD. No thyromegaly.  LUNGS:  Clear to auscultation.  HEART:  Regular rate. S1 and S2. He has no murmurs, gallops, or rubs.  ABDOMEN:  Good bowel sounds and is nontender.  EXTREMITIES:  No clubbing, cyanosis, or edema.  NEUROLOGICAL:  Nonfocal.   IMPRESSION:  Donald [name] is now admitted to the hospital for heart  catheterization. He has significant ST segment depression in the  inferolateral leads consistent with  ischemia. The fact that he does not have  any ischemia on the sinographic images does not exclude coronary artery  disease necessarily. He could have balance disease. This is also supported  by the fact that his left ventricular systolic function is globally  depressed. We discussed, the risks, benefits, and options of heart  catheterization. He understands and agrees to proceed.                                        ___________________________________________  Vesta Mixer, M.D.    PJN/MEDQ  D:  09/18/2004  T:  09/18/2004  Job:  [id number]   cc:   Donald [name], M.D.  [address]  [place]  [place] [address]  Fax: [phone]   Cardiac Catheter Lab at [facility]

## 2010-10-03 NOTE — Discharge Summary (Signed)
NAMEVERNARD, GRAM NO.:  [id number]   MEDICAL RECORD NO.:  [id number]          PATIENT TYPE:  INP   LOCATION:  2029                         FACILITY:  [facility]   PHYSICIAN:  Evelene Croon, M.D.     DATE OF BIRTH:  [birth year]   DATE OF ADMISSION:  10/02/2004  DATE OF DISCHARGE:                                 DISCHARGE SUMMARY   ANTICIPATED DATE OF DISCHARGE:  Oct 15, 2004.   ADMITTING DIAGNOSES:  1.  Coronary artery disease.  2.  Severe aortic stenosis.   DISCHARGE DIAGNOSES:  1.  Severe two-vessel coronary artery disease.  2.  Severe aortic stenosis.  3.  Hypertension.  4.  Hyperlipidemia.  5.  Hyperthyroidism.  6.  History of hiatal hernia.  7.  History of right kidney stone.  8.  Postoperative sick sinus syndrome.  9.  Postoperative atrial fibrillation.   OPERATION PERFORMED:  1.  Coronary artery bypass grafting times three (left internal mammary      artery to the left anterior descending, and sequential saphenous vein      graft to the obtuse marginal and posterior descending artery off the      left circumflex).  2.  Aortic valve replacement with a 25 millimeter St. Jude mechanical aortic      valve.  3.  Endoscopic vein harvest, right leg.  4.  Insertion of dual chamber pacemaker.   HISTORY:  The patient is a 70 year old white male who recently presented  with new onset substernal chest pain and shortness of breath usually  occurring with heavy exertion.  He was seen by Dr. Elease Hashimoto and underwent a  Cardiolite stress test on September 12, 2004, which showed 2-3 mm ST depression  in the inferior and lateral leads with exercise.  His ejection fraction was  49%.  Also, he has a newly diagnosed heart murmur and apparently has been  told he has mild aortic stenosis.  Because of his recent onset of symptoms  and his findings on Cardiolite he underwent subsequent cardiac  catheterization on Sep 19, 2004, which showed severe aorta stenosis with an  aortic  valve area of 0.76-0.97.  He also was noted to have a 50% stenosis in  the proximal LAD, a 60% midvessel stenosis of the left circumflex at the  takeoff of the first obtuse marginal branch and the right coronary was a  nondominant small vessel.  The ejection fraction was about 50% and his  aortic valve was noted to be heavily calcified.  Because of all these  findings he was referred to Dr. Evelene Croon for consideration of surgical  intervention at this time.   Dr. Laneta Simmers saw the patient as an outpatient, reviewed his films and agreed  that the best course of action would be to proceed with aortic valve  replacement and coronary artery bypass surgery at this time.  After  explanation of the risks, benefits and alternatives of the procedure the  patient agreed to proceed.   HOSPITAL COURSE:  Mr. Curci was brought in for outpatient admission to  [facility] on  Oct 02, 2004.  He was taken to the operating room and  underwent coronary artery bypass grafting times three and aortic valve  replacement as described in detail above.  He tolerated the procedures well  and was transferred to the SICU in stable condition postoperatively.  He was  able to be extubated shortly after surgery.  He was hemodynamically stable,  but required AV pacing initially due a junctional rhythm in the 50s.  He had  also had some hypertension, it was basilar, and he was not started on a beta  blocker.  He was started on Coumadin and his chest tubes and hemodynamic  monitoring devices were removed, and he was able to be transferred to the  floor late in the day on postop day one.   The patient was slowly mobilized with cardiac rehab phase I.  Postoperatively his course was complicated by rhythm issues.  He initially  had a junctional rhythm, which required AV pacing, but this was subsequently  weaned and discontinued.  He then developed rapid atrial fibrillation and  was started on IV amiodarone.  He  converted to sinus rhythm, but  subsequently went back into junctional rhythm requiring external pacing.  He  continued to have alternating sinus bradycardia and junctional rhythm, and  paced intermittently.  It was ultimately felt necessary to place a permanent  pacemaker.  This was performed in the operating room by Dr. Laneta Simmers on Oct 10, 2004.  He tolerated the procedure well and was returned to the floor in  stable condition.   Since that time is pacer has been functioning appropriately and his rhythm  has remained stable.  Otherwise postoperatively he has done very well.  He  has been ambulating in the halls without difficulty with cardiac rehab phase  I as well as independently with his family, and is doing very well.  He has  remained afebrile and all vital signs have been stable.  His surgical  incision sites were all healing well.   The patient had been somewhat volume overloaded and was started on Lasix,  and was diuresing well.  He was tolerating a regular diet, and has normal  bowel and bladder function.  He has been anticoagulated and his INR had been  somewhat low to reach therapeutic range.  At present he has a PT of 17.7  with an INR of 1.7.  The remainder of his recent labs showed a hemoglobin of  10.3, hematocrit 30.9, platelets 378,000 and white count 10.2.  Sodium 139,  potassium 4.0, BUN 16, and creatinine 1.0.  It is anticipated that his INR  will reach therapeutic range by Oct 15, 2004.  Hopefully, if that is the  case and he is otherwise doing well he will be ready for discharge home at  that time pending evaluation on morning rounds.   DISCHARGE MEDICATIONS:  The discharge medications were as follows:  1.  Enteric coated aspirin 81 mg daily.  2.  Coumadin home dose will be determined by prothrombin time and INR drawn      on the day of discharge.  3.  Lipitor 10 mg q.h.s.  4.  Synthroid 137 mcg daily. 5.  Lasix 40 mg daily times five days.  6.  Potassium 20  mEq daily times five days.  7.  Amiodarone 200 mg b.i.d.  8.  Oxycodone 5 mg 1-2 q.4-6 hours p.r.n. for pain.   DISCHARGE INSTRUCTIONS:  The patient is asked to refrain from driving, heavy  lifting and strenuous activity.  He may continue ambulating daily and using  his incentive spirometer.  He will continue a low-fat, low-sodium diet.  He  may shower daily and clean his incisions with soap and water.   FOLLOW UP:  Discharge follow up:  The patient is asked to make an  appointment to see Dr. Elease Hashimoto in two weeks.  He will have a chest x-ray at  that visit.  He will then see Dr. Laneta Simmers on November 04, 2004 at 12:15 P.M.  He  was asked to  bring his x-ray to that appointment for Dr. Laneta Simmers to review.  He was also  asked to have a PT and INR drawn on Friday, June 02nd, in Dr. Harvie Bridge  office for further management of his Coumadin.  If he experiences any  problems or has questions following his discharge he is asked to contact our  office immediately.       GC/MEDQ  D:  10/14/2004  T:  10/15/2004  Job:  [id number]   cc:   Vesta Mixer, M.D.  [address]  [place]  [place] [address]  Fax: [phone]   Gaspar Garbe, M.D.  [address]  [place]  [place] [address]  Fax: [phone]

## 2010-10-03 NOTE — Discharge Summary (Signed)
NAMETEDD, COTTRILL NO.:  [id number]   MEDICAL RECORD NO.:  [id number]          PATIENT TYPE:  INP   LOCATION:  2029                         FACILITY:  [facility]   PHYSICIAN:  Evelene Croon, M.D.     DATE OF BIRTH:  02-14-1941   DATE OF ADMISSION:  10/02/2004  DATE OF DISCHARGE:  10/10/2004                                 DISCHARGE SUMMARY   PRIMARY DIAGNOSES:  1.  Coronary artery disease.  2.  Aortic stenosis.   IN-HOSPITAL DIAGNOSES:  1.  Postoperative atrial fibrillation/junctional rhythm.  2.  Sick sinus syndrome.  3.  Bilateral forearm IV infiltrates.   SECONDARY DIAGNOSES:  1.  Hypertension.  2.  Hypothyroidism.  3.  Hyperlipidemia.  4.  History of hepatitis A after eating at a contaminated food restaurant.   IN-HOSPITAL OPERATIONS AND PROCEDURES:  1.  Coronary artery bypass grafting x3 with a left internal mammary artery      to the left anterior descending, saphenous vein graft to the obtuse      marginal and posterior descending branch of the left circumflex coronary      artery.  2.  Aortic valve replacement using a 25-mm St. Jude mechanical valve.  Noted      endoscopic vein harvesting from the right leg was done.  3.  Intraoperative transesophageal echocardiogram.   Dictation ended at this point.       KMD/MEDQ  D:  10/10/2004  T:  10/10/2004  Job:  [id number]

## 2010-10-07 ENCOUNTER — Encounter: Payer: BC Managed Care – PPO | Admitting: *Deleted

## 2010-10-17 ENCOUNTER — Ambulatory Visit (INDEPENDENT_AMBULATORY_CARE_PROVIDER_SITE_OTHER): Payer: BC Managed Care – PPO | Admitting: *Deleted

## 2010-10-17 ENCOUNTER — Encounter: Payer: BC Managed Care – PPO | Admitting: *Deleted

## 2010-10-17 DIAGNOSIS — R0989 Other specified symptoms and signs involving the circulatory and respiratory systems: Secondary | ICD-10-CM

## 2010-10-17 DIAGNOSIS — I359 Nonrheumatic aortic valve disorder, unspecified: Secondary | ICD-10-CM

## 2010-10-17 DIAGNOSIS — I350 Nonrheumatic aortic (valve) stenosis: Secondary | ICD-10-CM

## 2010-10-17 LAB — POCT INR: INR: 3.1

## 2010-11-18 ENCOUNTER — Ambulatory Visit (INDEPENDENT_AMBULATORY_CARE_PROVIDER_SITE_OTHER): Payer: BC Managed Care – PPO | Admitting: *Deleted

## 2010-11-18 DIAGNOSIS — I350 Nonrheumatic aortic (valve) stenosis: Secondary | ICD-10-CM

## 2010-11-18 DIAGNOSIS — I359 Nonrheumatic aortic valve disorder, unspecified: Secondary | ICD-10-CM

## 2010-11-18 LAB — POCT INR: INR: 3.1

## 2010-12-04 ENCOUNTER — Ambulatory Visit (INDEPENDENT_AMBULATORY_CARE_PROVIDER_SITE_OTHER): Payer: Medicare Other | Admitting: *Deleted

## 2010-12-04 ENCOUNTER — Other Ambulatory Visit: Payer: Self-pay

## 2010-12-04 DIAGNOSIS — I495 Sick sinus syndrome: Secondary | ICD-10-CM

## 2010-12-05 LAB — REMOTE PACEMAKER DEVICE
AL AMPLITUDE: 4.3 mv
AL IMPEDENCE PM: 552 Ohm
ATRIAL PACING PM: 32.69
RV LEAD IMPEDENCE PM: 416 Ohm
VENTRICULAR PACING PM: 16.78

## 2010-12-10 ENCOUNTER — Encounter: Payer: Self-pay | Admitting: Internal Medicine

## 2010-12-10 NOTE — Progress Notes (Signed)
Pacer remote check  

## 2010-12-11 ENCOUNTER — Encounter: Payer: Self-pay | Admitting: Internal Medicine

## 2010-12-11 ENCOUNTER — Encounter: Payer: Self-pay | Admitting: *Deleted

## 2010-12-11 ENCOUNTER — Ambulatory Visit (INDEPENDENT_AMBULATORY_CARE_PROVIDER_SITE_OTHER): Payer: Medicare Other | Admitting: Internal Medicine

## 2010-12-11 ENCOUNTER — Ambulatory Visit (INDEPENDENT_AMBULATORY_CARE_PROVIDER_SITE_OTHER): Payer: Medicare Other | Admitting: *Deleted

## 2010-12-11 DIAGNOSIS — I359 Nonrheumatic aortic valve disorder, unspecified: Secondary | ICD-10-CM

## 2010-12-11 DIAGNOSIS — I495 Sick sinus syndrome: Secondary | ICD-10-CM

## 2010-12-11 DIAGNOSIS — I10 Essential (primary) hypertension: Secondary | ICD-10-CM

## 2010-12-11 DIAGNOSIS — I350 Nonrheumatic aortic (valve) stenosis: Secondary | ICD-10-CM

## 2010-12-11 DIAGNOSIS — I2510 Atherosclerotic heart disease of native coronary artery without angina pectoris: Secondary | ICD-10-CM

## 2010-12-11 NOTE — Assessment & Plan Note (Signed)
Doing well No complaints   On coumadin for AVR.  No changes today

## 2010-12-11 NOTE — Assessment & Plan Note (Signed)
Stable No change required today  

## 2010-12-11 NOTE — Assessment & Plan Note (Signed)
He has reached ERI battery status and reverted to VVI He is reprogrammed to DDD today. See Arita Miss Art report  R/B/A to pacemaker pulse generator replacement were discussed at length today.  He understands the risks and wishes to proceed.  We will therefore plan PPM generator change at the next available time.

## 2010-12-11 NOTE — Progress Notes (Signed)
The patient presents today for routine electrophysiology followup.  Since last being seen in our clinic, the patient reports doing very well.  Recent TTM reveals that he has reached ERI battery status.  He continues to do well.  Today, he denies symptoms of palpitations, chest pain, shortness of breath, orthopnea, PND, lower extremity edema, dizziness, presyncope, syncope, or neurologic sequela.  The patient feels that he is tolerating medications without difficulties and is otherwise without complaint today.   Past Medical History  Diagnosis Date  . Hyperlipidemia   . Hypertension   . Aortic stenosis     s/p AVR by Dr Laneta Simmers  . Hyperthyroidism   . Hiatal hernia   . Right kidney stone     hx of  . Sick sinus syndrome     s/p PPM (MDT) 2006  . Atrial fibrillation   . H/O: rheumatic fever   . CAD (coronary artery disease)     s/p CABG 2006   Past Surgical History  Procedure Date  . Insert / replace / remove pacemaker 2006    MDT for sick sinus syndrome  . Coronary artery bypass graft 2006  . Aortic valve replacement 2006    Current Outpatient Prescriptions  Medication Sig Dispense Refill  . aspirin 81 MG tablet Take 81 mg by mouth daily.        Marland Kitchen atorvastatin (LIPITOR) 20 MG tablet Take 20 mg by mouth daily.        Marland Kitchen levothyroxine (SYNTHROID, LEVOTHROID) 125 MCG tablet Take 125 mcg by mouth daily.        . metoprolol (TOPROL-XL) 50 MG 24 hr tablet Take 50 mg by mouth daily.        Marland Kitchen rOPINIRole (REQUIP) 0.25 MG tablet Take 2 tabs 3 times a day  90 tablet  11  . trihexyphenidyl (ARTANE) 2 MG tablet Take 1 tablet by mouth Twice daily.      Marland Kitchen warfarin (COUMADIN) 5 MG tablet Take 5 mg by mouth daily.          No Known Allergies  History   Social History  . Marital Status: Single    Spouse Name: N/A    Number of Children: N/A  . Years of Education: N/A   Occupational History  . Not on file.   Social History Main Topics  . Smoking status: Never Smoker   . Smokeless  tobacco: Not on file  . Alcohol Use: No  . Drug Use: No  . Sexually Active: Not on file   Other Topics Concern  . Not on file   Social History Narrative  . No narrative on file    Family History  Problem Relation Age of Onset  . Coronary artery disease      family hx of    ROS-  All systems are reviewed and are negative except as outlined in the HPI above    Physical Exam: Filed Vitals:   12/11/10 1640  BP: 130/68  Height: 5\' 11"  (1.803 m)  Weight: 201 lb (91.173 kg)    GEN- The patient is well appearing, alert and oriented x 3 today.   Head- normocephalic, atraumatic Eyes-  Sclera clear, conjunctiva pink Ears- hearing intact Oropharynx- clear Neck- supple, no JVP Lymph- no cervical lymphadenopathy Lungs- Clear to ausculation bilaterally, normal work of breathing Chest- R sided pacemaker pocket is well healed Heart- Regular rate and rhythm, mechanical S2 GI- soft, NT, ND, + BS Extremities- no clubbing, cyanosis, or edema MS- no significant deformity  or atrophy Skin- no rash or lesion Psych- euthymic mood, full affect Neuro- strength and sensation are intact  Pacemaker interrogation- reviewed in detail today,  See PACEART report  Assessment and Plan:

## 2010-12-23 ENCOUNTER — Encounter: Payer: BC Managed Care – PPO | Admitting: *Deleted

## 2010-12-24 ENCOUNTER — Telehealth: Payer: Self-pay | Admitting: Internal Medicine

## 2010-12-24 DIAGNOSIS — I495 Sick sinus syndrome: Secondary | ICD-10-CM

## 2010-12-24 NOTE — Telephone Encounter (Signed)
I talked with pt's wife. Pt to come for lab 12/26/10, generator change scheduled for 01/02/11.

## 2010-12-24 NOTE — Telephone Encounter (Signed)
Pt needs to know what time to come in for blood work and when dose he need to be at the hospital.

## 2010-12-26 ENCOUNTER — Ambulatory Visit (INDEPENDENT_AMBULATORY_CARE_PROVIDER_SITE_OTHER): Payer: Self-pay

## 2010-12-26 ENCOUNTER — Other Ambulatory Visit (INDEPENDENT_AMBULATORY_CARE_PROVIDER_SITE_OTHER): Payer: Medicare Other | Admitting: *Deleted

## 2010-12-26 DIAGNOSIS — R0989 Other specified symptoms and signs involving the circulatory and respiratory systems: Secondary | ICD-10-CM

## 2010-12-26 DIAGNOSIS — I495 Sick sinus syndrome: Secondary | ICD-10-CM

## 2010-12-26 LAB — CBC WITH DIFFERENTIAL/PLATELET
Eosinophils Relative: 1.4 % (ref 0.0–5.0)
HCT: 42.2 % (ref 39.0–52.0)
Hemoglobin: 13.6 g/dL (ref 13.0–17.0)
MCHC: 32.2 g/dL (ref 30.0–36.0)
MCV: 84.2 fl (ref 78.0–100.0)
WBC: 7.2 10*3/uL (ref 4.5–10.5)

## 2010-12-26 LAB — BASIC METABOLIC PANEL
Calcium: 9 mg/dL (ref 8.4–10.5)
Creatinine, Ser: 0.7 mg/dL (ref 0.4–1.5)
GFR: 116.77 mL/min (ref >60.00)
Glucose, Bld: 90 mg/dL (ref 70–99)
Potassium: 4.1 mEq/L (ref 3.5–5.1)

## 2010-12-26 LAB — PROTIME-INR: INR: 3.2 ratio — ABNORMAL HIGH (ref 0.8–1.0)

## 2011-01-02 ENCOUNTER — Ambulatory Visit (HOSPITAL_COMMUNITY)
Admission: RE | Admit: 2011-01-02 | Discharge: 2011-01-02 | Disposition: A | Discharge status: Home / Self Care | Payer: Medicare Other | Source: Ambulatory Visit | Attending: Internal Medicine | Admitting: Internal Medicine

## 2011-01-02 ENCOUNTER — Ambulatory Visit (HOSPITAL_COMMUNITY): Payer: Medicare Other

## 2011-01-02 DIAGNOSIS — I10 Essential (primary) hypertension: Secondary | ICD-10-CM | POA: Insufficient documentation

## 2011-01-02 DIAGNOSIS — Z954 Presence of other heart-valve replacement: Secondary | ICD-10-CM | POA: Insufficient documentation

## 2011-01-02 DIAGNOSIS — Z01818 Encounter for other preprocedural examination: Secondary | ICD-10-CM | POA: Insufficient documentation

## 2011-01-02 DIAGNOSIS — I2510 Atherosclerotic heart disease of native coronary artery without angina pectoris: Secondary | ICD-10-CM | POA: Insufficient documentation

## 2011-01-02 DIAGNOSIS — I495 Sick sinus syndrome: Secondary | ICD-10-CM | POA: Insufficient documentation

## 2011-01-02 DIAGNOSIS — Z45018 Encounter for adjustment and management of other part of cardiac pacemaker: Secondary | ICD-10-CM | POA: Insufficient documentation

## 2011-01-02 DIAGNOSIS — Z951 Presence of aortocoronary bypass graft: Secondary | ICD-10-CM | POA: Insufficient documentation

## 2011-01-02 DIAGNOSIS — E785 Hyperlipidemia, unspecified: Secondary | ICD-10-CM | POA: Insufficient documentation

## 2011-01-02 DIAGNOSIS — K449 Diaphragmatic hernia without obstruction or gangrene: Secondary | ICD-10-CM | POA: Insufficient documentation

## 2011-01-02 LAB — SURGICAL PCR SCREEN: MRSA, PCR: NEGATIVE

## 2011-01-02 LAB — PROTIME-INR: INR: 1.77 — ABNORMAL HIGH (ref 0.00–1.49)

## 2011-01-05 ENCOUNTER — Ambulatory Visit (INDEPENDENT_AMBULATORY_CARE_PROVIDER_SITE_OTHER): Payer: Medicare Other | Admitting: *Deleted

## 2011-01-05 DIAGNOSIS — I350 Nonrheumatic aortic (valve) stenosis: Secondary | ICD-10-CM

## 2011-01-05 DIAGNOSIS — I359 Nonrheumatic aortic valve disorder, unspecified: Secondary | ICD-10-CM

## 2011-01-06 ENCOUNTER — Encounter: Payer: Medicare Other | Admitting: *Deleted

## 2011-01-08 NOTE — Op Note (Signed)
  NAME:  YANI, LAL NO.:  [id number]  MEDICAL RECORD NO.:  [id number]  LOCATION:  [facility]                         FACILITY:  [facility]  PHYSICIAN:  Hillis Range, MD       DATE OF BIRTH:  05-02-1941  DATE OF PROCEDURE: DATE OF DISCHARGE:  01/02/2011                              OPERATIVE REPORT   PREPROCEDURE DIAGNOSES: 1. Sick sinus syndrome. 2. Pacemaker and elective replacement indicator.  POSTPROCEDURE DIAGNOSES: 1. Sick sinus syndrome. 2. Pacemaker and elective replacement indicator.  PROCEDURES:  Pacemaker pulse generator replacement with skin pocket revision.  INTRODUCTION:  Mr. Doering is a pleasant 70 year old gentleman with a history of sick sinus syndrome status post prior pacemaker implantation in 2006.  He now presents with ERI battery status.  He presents today for pacemaker pulse generator replacement.  DESCRIPTION OF PROCEDURE:  Informed written consent was obtained and the patient was brought to the Electrophysiology Lab in the fasting state. He was adequately sedated with intravenous Versed as outlined in the nursing report.  The patient's right chest was prepped and draped in the usual sterile fashion by the EP lab staff.  The skin overlying his existing pacemaker was infiltrated with lidocaine for local analgesia. A 4-cm incision was made over the pacemaker pocket.  Using a combination of sharp and blunt dissection, the pacemaker was exposed and carefully removed from the body.  There was no foreign matter or debris within the pocket.  The pacemaker was disconnected from the leads.  The atrial and ventricular leads were examined and their integrity was confirmed to be intact.  The atrial lead was confirmed to be a Medtronic model [id number] (serial number [id number]) lead implanted Oct 10, 2004.  The right ventricular lead was confirmed to be a Medtronic model [id number] (serial number [id number]) lead implanted Oct 10, 2004.  Atrial lead  P-waves measured 7.2 mV with impedance of 467 ohms and a threshold of 0.3 volts at 0.5 milliseconds.  Right ventricular lead R-waves measured 12 mV with impedance of 451 ohms and a threshold of 0.3 volts at 0.5 milliseconds. Both leads were connected to a Medtronic Adapta  model [id number] (serial number [id number]) pacemaker.  The pocket was then revised to accommodate this new and bigger device.  The pocket was then irrigated with copious gentamicin solution.  The pacemaker was placed into the pocket.  The pocket was then closed in 2 layers with 2.0 Vicryl suture for the subcutaneous and subcuticular layers.  Steri-Strips and a sterile dressing were then applied.  There were no early apparent complications.  CONCLUSIONS: 1. Successful pacemaker pulse generator replacement with an Adapta     dual-chamber pacemaker for ERI battery status. 2. No early apparent complications.     Hillis Range, MD     JA/MEDQ  D:  01/02/2011  T:  01/02/2011  Job:  [id number]  cc:   Gaspar Garbe, M.D.  Electronically Signed by Hillis Range MD on 01/08/2011 09:44:42 AM

## 2011-01-12 ENCOUNTER — Encounter: Payer: Self-pay | Admitting: Internal Medicine

## 2011-01-12 ENCOUNTER — Ambulatory Visit (INDEPENDENT_AMBULATORY_CARE_PROVIDER_SITE_OTHER): Payer: Medicare Other | Admitting: *Deleted

## 2011-01-12 DIAGNOSIS — I495 Sick sinus syndrome: Secondary | ICD-10-CM

## 2011-01-12 LAB — PACEMAKER DEVICE OBSERVATION
ATRIAL PACING PM: 84
BAMS-0001: 175 {beats}/min
BATTERY VOLTAGE: 2.79 V
RV LEAD THRESHOLD: 0.75 V
VENTRICULAR PACING PM: 2

## 2011-01-12 NOTE — Progress Notes (Signed)
Pacer check in clinic  

## 2011-01-23 ENCOUNTER — Encounter: Payer: Medicare Other | Admitting: *Deleted

## 2011-01-30 ENCOUNTER — Ambulatory Visit (INDEPENDENT_AMBULATORY_CARE_PROVIDER_SITE_OTHER): Payer: Medicare Other | Admitting: *Deleted

## 2011-01-30 DIAGNOSIS — I350 Nonrheumatic aortic (valve) stenosis: Secondary | ICD-10-CM

## 2011-01-30 DIAGNOSIS — I359 Nonrheumatic aortic valve disorder, unspecified: Secondary | ICD-10-CM

## 2011-01-30 LAB — POCT INR: INR: 3.2

## 2011-02-27 ENCOUNTER — Ambulatory Visit (INDEPENDENT_AMBULATORY_CARE_PROVIDER_SITE_OTHER): Payer: Medicare Other | Admitting: *Deleted

## 2011-02-27 DIAGNOSIS — I350 Nonrheumatic aortic (valve) stenosis: Secondary | ICD-10-CM

## 2011-02-27 DIAGNOSIS — I359 Nonrheumatic aortic valve disorder, unspecified: Secondary | ICD-10-CM

## 2011-02-27 LAB — POCT INR: INR: 3.2

## 2011-03-12 ENCOUNTER — Encounter: Payer: BC Managed Care – PPO | Admitting: *Deleted

## 2011-03-27 ENCOUNTER — Ambulatory Visit (INDEPENDENT_AMBULATORY_CARE_PROVIDER_SITE_OTHER): Payer: Medicare Other | Admitting: *Deleted

## 2011-03-27 DIAGNOSIS — I359 Nonrheumatic aortic valve disorder, unspecified: Secondary | ICD-10-CM

## 2011-03-27 DIAGNOSIS — I350 Nonrheumatic aortic (valve) stenosis: Secondary | ICD-10-CM

## 2011-04-02 ENCOUNTER — Encounter: Payer: Self-pay | Admitting: Internal Medicine

## 2011-04-02 ENCOUNTER — Ambulatory Visit (INDEPENDENT_AMBULATORY_CARE_PROVIDER_SITE_OTHER): Payer: Medicare Other | Admitting: Internal Medicine

## 2011-04-02 DIAGNOSIS — I359 Nonrheumatic aortic valve disorder, unspecified: Secondary | ICD-10-CM

## 2011-04-02 DIAGNOSIS — I495 Sick sinus syndrome: Secondary | ICD-10-CM

## 2011-04-02 DIAGNOSIS — I10 Essential (primary) hypertension: Secondary | ICD-10-CM

## 2011-04-02 LAB — PACEMAKER DEVICE OBSERVATION
AL AMPLITUDE: 4 mv
AL IMPEDENCE PM: 478 Ohm
BATTERY VOLTAGE: 2.79 V
RV LEAD IMPEDENCE PM: 438 Ohm
VENTRICULAR PACING PM: 1

## 2011-04-02 NOTE — Assessment & Plan Note (Signed)
Doing well s/p AVR Continue coumadin

## 2011-04-02 NOTE — Assessment & Plan Note (Signed)
Stable No change required today  

## 2011-04-02 NOTE — Assessment & Plan Note (Signed)
Normal pacemaker function See Arita Miss Art report No changes today   TTMs every 3 months, return in 12 months

## 2011-04-02 NOTE — Progress Notes (Signed)
The patient presents today for routine electrophysiology followup.  Since last being seen in our clinic, the patient reports doing very well. He continues to do well.  He has had no issues sp PPM gen change.  Today, he denies symptoms of palpitations, chest pain, shortness of breath, orthopnea, PND, lower extremity edema, dizziness, presyncope, syncope, or neurologic sequela.  The patient feels that he is tolerating medications without difficulties and is otherwise without complaint today.   Past Medical History  Diagnosis Date  . Hyperlipidemia   . Hypertension   . Aortic stenosis     s/p AVR by Dr Laneta Simmers  . Hyperthyroidism   . Hiatal hernia   . Right kidney stone     hx of  . Sick sinus syndrome     s/p PPM (MDT) 2006  . Atrial fibrillation     paroxysmal  . H/O: rheumatic fever   . CAD (coronary artery disease)     s/p CABG 2006  . Parkinson's disease    Past Surgical History  Procedure Date  . Insert / replace / remove pacemaker 2006, 2012    MDT for sick sinus syndrome, gen change 8/12 by JA  . Coronary artery bypass graft 2006  . Aortic valve replacement 2006    Current Outpatient Prescriptions  Medication Sig Dispense Refill  . aspirin 81 MG tablet Take 81 mg by mouth daily.        Marland Kitchen atorvastatin (LIPITOR) 20 MG tablet Take 20 mg by mouth daily.        Marland Kitchen levothyroxine (SYNTHROID, LEVOTHROID) 125 MCG tablet Take 125 mcg by mouth daily.        . metoprolol (TOPROL-XL) 50 MG 24 hr tablet Take 25 mg by mouth daily.       Marland Kitchen rOPINIRole (REQUIP) 0.25 MG tablet 1 mg 3 (three) times daily.   90 tablet  11  . trihexyphenidyl (ARTANE) 2 MG tablet Take 1 tablet by mouth Twice daily.      Marland Kitchen warfarin (COUMADIN) 5 MG tablet Take 5 mg by mouth daily.          No Known Allergies  History   Social History  . Marital Status: Single    Spouse Name: N/A    Number of Children: N/A  . Years of Education: N/A   Occupational History  . Not on file.   Social History Main Topics  .  Smoking status: Never Smoker   . Smokeless tobacco: Not on file  . Alcohol Use: No  . Drug Use: No  . Sexually Active: Not on file   Other Topics Concern  . Not on file   Social History Narrative  . No narrative on file    Family History  Problem Relation Age of Onset  . Coronary artery disease      family hx of   Physical Exam: Filed Vitals:   04/02/11 0909  BP: 139/81  Pulse: 71  Height: 5\' 11"  (1.803 m)  Weight: 200 lb 12.8 oz (91.082 kg)    GEN- The patient is well appearing, alert and oriented x 3 today.   Head- normocephalic, atraumatic Eyes-  Sclera clear, conjunctiva pink Ears- hearing intact Oropharynx- clear Neck- supple, no JVP Lymph- no cervical lymphadenopathy Lungs- Clear to ausculation bilaterally, normal work of breathing Chest- R sided pacemaker pocket is well healed Heart- Regular rate and rhythm, mechanical S2 GI- soft, NT, ND, + BS Extremities- no clubbing, cyanosis, or edema MS- no significant deformity or atrophy Skin-  no rash or lesion Psych- euthymic mood, full affect Neuro- strength and sensation are intact, significant resting tremor of upper extremities  Pacemaker interrogation- reviewed in detail today,  See PACEART report  Assessment and Plan:

## 2011-04-28 ENCOUNTER — Telehealth: Payer: Self-pay | Admitting: Internal Medicine

## 2011-04-28 NOTE — Telephone Encounter (Signed)
Pt has colonoscopy on 12/20 and he is on warfarin and they want to know if pt needs to stop he warfarin and when should he stop it. Does he need lab work prior to procedure or is the appt he has after is ok

## 2011-04-29 NOTE — Telephone Encounter (Signed)
Dr Johney Frame spoke with Kennon Rounds and patient will need a Lovenox bridge

## 2011-04-29 NOTE — Telephone Encounter (Signed)
New problem:  Per [name] , colonoscopy on 1/03/ 2013 . Stop coumadin 5 days prior. pls advise on lovenox.

## 2011-04-30 NOTE — Telephone Encounter (Signed)
Pt has Coumadin clinic appt on 12/21.  Will discuss Lovenox bridge instructions at that time.  Pt's wife aware.

## 2011-05-06 ENCOUNTER — Telehealth: Payer: Self-pay | Admitting: Internal Medicine

## 2011-05-06 NOTE — Telephone Encounter (Signed)
New problem:  Procedure on 05-27-11. Pt need to stop coumadin 5 days prior.

## 2011-05-06 NOTE — Telephone Encounter (Signed)
I spoke with Donald [name], the patient will be having a colonoscopy for history of polyps. He does have a history of a-fib and AVR. I explained he will probably need lovenox bridging. Per Donald [name], she thinks he did that last time. I advised we will review with Dr. Johney [name] and let her know. She will be out of town most of next week and was trying to find out prior to leaving town. I explained if we need to start lovenox, then we could manage that for the patient. I will forward to [facility] and Dr. Johney [name].

## 2011-05-07 NOTE — Telephone Encounter (Signed)
He will need lovenox bridge. I will ask our coumadin clinic to arrange.

## 2011-05-08 ENCOUNTER — Ambulatory Visit (INDEPENDENT_AMBULATORY_CARE_PROVIDER_SITE_OTHER): Payer: Medicare Other | Admitting: *Deleted

## 2011-05-08 DIAGNOSIS — I350 Nonrheumatic aortic (valve) stenosis: Secondary | ICD-10-CM

## 2011-05-08 DIAGNOSIS — I359 Nonrheumatic aortic valve disorder, unspecified: Secondary | ICD-10-CM

## 2011-05-08 MED ORDER — ENOXAPARIN SODIUM 100 MG/ML ~~LOC~~ SOLN: 100.0000 mg | Freq: Two times a day (BID) | SUBCUTANEOUS | Status: DC

## 2011-05-08 NOTE — Telephone Encounter (Signed)
Set up Lovenox bridge for patient at visit this morning.

## 2011-05-08 NOTE — Patient Instructions (Signed)
Continue same dose of 1 1/2 tablets every day except 1 tablet on Sunday, Tuesday and Thursday until 05/22/11.   05/22/11- Take last dose of Coumadin (1 1/2 tablets) 05/23/11- No coumadin or Lovenox 05/24/11- Start Lovenox 100mg  in AM and PM 05/25/11- Lovenox 100mg  in AM and PM 05/26/11- Lovenox in AM only 05/27/11- Day of procedure.  When okay with MD, restart Coumadin and Lovenox.  Take 2 tablets of Coumadin for the first 2 days then resume previous dose of 1 1/2 tablets every day except 1 tablet on Sunday, Tuesday and Thursday.  Continue Lovenox 100mg  in AM and PM until INR >2 06/01/11- Recheck INR.

## 2011-05-11 ENCOUNTER — Other Ambulatory Visit: Payer: Self-pay | Admitting: Internal Medicine

## 2011-05-11 NOTE — Telephone Encounter (Signed)
Coumadin clinic has order but not placed since doesn't start until Jan 6. Lm for wife to call coumadin clinic with drug store.

## 2011-05-11 NOTE — Telephone Encounter (Signed)
New msg Pt's wife wants to know about levonox prescription She says pharmacy doesn't have it

## 2011-05-13 MED ORDER — ENOXAPARIN SODIUM 100 MG/ML ~~LOC~~ SOLN: 100.0000 mg | Freq: Two times a day (BID) | SUBCUTANEOUS | Status: DC

## 2011-05-13 NOTE — Telephone Encounter (Signed)
Spoke to wife today she is requesting Lovenox be sent to [facility] in [place].

## 2011-05-15 ENCOUNTER — Other Ambulatory Visit: Payer: Self-pay | Admitting: Cardiovascular Disease

## 2011-05-22 ENCOUNTER — Telehealth: Payer: Self-pay | Admitting: Internal Medicine

## 2011-05-22 MED ORDER — METOPROLOL SUCCINATE ER 50 MG PO TB24: 50.0000 mg | ORAL_TABLET | Freq: Every day | ORAL | Status: DC

## 2011-05-22 MED ORDER — WARFARIN SODIUM 5 MG PO TABS: 5.0000 mg | ORAL_TABLET | Freq: Every day | ORAL | Status: DC

## 2011-05-22 MED ORDER — ATORVASTATIN CALCIUM 20 MG PO TABS: 20.0000 mg | ORAL_TABLET | Freq: Every day | ORAL | Status: DC

## 2011-05-22 NOTE — Telephone Encounter (Signed)
New msg Pt needs new written rx of Metoprolol, warfarin, atorvastatin 90 day supply Please mail to pt so they can send to a their pharmacy. He has new ins and it requires new written rx

## 2011-05-22 NOTE — Telephone Encounter (Signed)
escribed to new pharmacy, [facility]. Pt aware. Coumadin checks been kept.

## 2011-06-01 ENCOUNTER — Ambulatory Visit (INDEPENDENT_AMBULATORY_CARE_PROVIDER_SITE_OTHER): Payer: Medicare Other | Admitting: *Deleted

## 2011-06-01 DIAGNOSIS — I350 Nonrheumatic aortic (valve) stenosis: Secondary | ICD-10-CM

## 2011-06-01 DIAGNOSIS — I359 Nonrheumatic aortic valve disorder, unspecified: Secondary | ICD-10-CM

## 2011-06-01 LAB — POCT INR: INR: 2

## 2011-06-19 ENCOUNTER — Ambulatory Visit (INDEPENDENT_AMBULATORY_CARE_PROVIDER_SITE_OTHER): Payer: Medicare Other | Admitting: *Deleted

## 2011-06-19 DIAGNOSIS — I350 Nonrheumatic aortic (valve) stenosis: Secondary | ICD-10-CM

## 2011-06-19 DIAGNOSIS — I359 Nonrheumatic aortic valve disorder, unspecified: Secondary | ICD-10-CM

## 2011-06-19 LAB — POCT INR: INR: 2.2

## 2011-07-02 ENCOUNTER — Encounter: Payer: Medicare Other | Admitting: *Deleted

## 2011-07-03 ENCOUNTER — Ambulatory Visit (INDEPENDENT_AMBULATORY_CARE_PROVIDER_SITE_OTHER): Payer: Medicare Other | Admitting: *Deleted

## 2011-07-03 DIAGNOSIS — I359 Nonrheumatic aortic valve disorder, unspecified: Secondary | ICD-10-CM

## 2011-07-03 DIAGNOSIS — I350 Nonrheumatic aortic (valve) stenosis: Secondary | ICD-10-CM

## 2011-07-03 LAB — POCT INR: INR: 2.5

## 2011-07-06 ENCOUNTER — Encounter: Payer: Self-pay | Admitting: *Deleted

## 2011-07-09 ENCOUNTER — Encounter: Payer: Self-pay | Admitting: Cardiology

## 2011-07-09 ENCOUNTER — Ambulatory Visit (INDEPENDENT_AMBULATORY_CARE_PROVIDER_SITE_OTHER): Payer: Medicare Other | Admitting: *Deleted

## 2011-07-09 DIAGNOSIS — I495 Sick sinus syndrome: Secondary | ICD-10-CM

## 2011-07-13 LAB — REMOTE PACEMAKER DEVICE
AL IMPEDENCE PM: 479 Ohm
AL THRESHOLD: 0.625 V
ATRIAL PACING PM: 67
RV LEAD AMPLITUDE: 22.4 mv
RV LEAD IMPEDENCE PM: 434 Ohm

## 2011-07-16 NOTE — Progress Notes (Signed)
Remote pacer check  

## 2011-07-21 ENCOUNTER — Encounter: Payer: Self-pay | Admitting: *Deleted

## 2011-07-24 ENCOUNTER — Ambulatory Visit (INDEPENDENT_AMBULATORY_CARE_PROVIDER_SITE_OTHER): Payer: Medicare Other

## 2011-07-24 DIAGNOSIS — I350 Nonrheumatic aortic (valve) stenosis: Secondary | ICD-10-CM

## 2011-07-24 DIAGNOSIS — I359 Nonrheumatic aortic valve disorder, unspecified: Secondary | ICD-10-CM

## 2011-07-24 LAB — POCT INR: INR: 2.7

## 2011-08-18 ENCOUNTER — Ambulatory Visit (INDEPENDENT_AMBULATORY_CARE_PROVIDER_SITE_OTHER): Payer: Medicare Other

## 2011-08-18 DIAGNOSIS — I359 Nonrheumatic aortic valve disorder, unspecified: Secondary | ICD-10-CM

## 2011-08-18 DIAGNOSIS — I350 Nonrheumatic aortic (valve) stenosis: Secondary | ICD-10-CM

## 2011-09-01 ENCOUNTER — Other Ambulatory Visit: Payer: Self-pay

## 2011-09-01 MED ORDER — WARFARIN SODIUM 5 MG PO TABS: ORAL_TABLET | ORAL | Status: DC

## 2011-09-15 ENCOUNTER — Ambulatory Visit (INDEPENDENT_AMBULATORY_CARE_PROVIDER_SITE_OTHER): Payer: Medicare Other | Admitting: Pharmacist

## 2011-09-15 DIAGNOSIS — I359 Nonrheumatic aortic valve disorder, unspecified: Secondary | ICD-10-CM

## 2011-09-15 DIAGNOSIS — I350 Nonrheumatic aortic (valve) stenosis: Secondary | ICD-10-CM

## 2011-10-13 ENCOUNTER — Ambulatory Visit (INDEPENDENT_AMBULATORY_CARE_PROVIDER_SITE_OTHER): Payer: Medicare Other | Admitting: *Deleted

## 2011-10-13 ENCOUNTER — Ambulatory Visit (INDEPENDENT_AMBULATORY_CARE_PROVIDER_SITE_OTHER): Payer: Medicare Other

## 2011-10-13 ENCOUNTER — Encounter: Payer: Self-pay | Admitting: Internal Medicine

## 2011-10-13 DIAGNOSIS — I350 Nonrheumatic aortic (valve) stenosis: Secondary | ICD-10-CM

## 2011-10-13 DIAGNOSIS — I359 Nonrheumatic aortic valve disorder, unspecified: Secondary | ICD-10-CM

## 2011-10-13 DIAGNOSIS — I495 Sick sinus syndrome: Secondary | ICD-10-CM

## 2011-10-13 LAB — POCT INR: INR: 3.6

## 2011-10-14 LAB — REMOTE PACEMAKER DEVICE
AL AMPLITUDE: 2.8 mv
BAMS-0001: 175 {beats}/min
BATTERY VOLTAGE: 2.79 V
RV LEAD AMPLITUDE: 11.2 mv

## 2011-10-15 NOTE — Progress Notes (Signed)
PPM remote 

## 2011-10-28 ENCOUNTER — Encounter: Payer: Self-pay | Admitting: *Deleted

## 2011-11-10 ENCOUNTER — Ambulatory Visit (INDEPENDENT_AMBULATORY_CARE_PROVIDER_SITE_OTHER): Payer: Medicare Other | Admitting: *Deleted

## 2011-11-10 DIAGNOSIS — I359 Nonrheumatic aortic valve disorder, unspecified: Secondary | ICD-10-CM

## 2011-11-10 DIAGNOSIS — I350 Nonrheumatic aortic (valve) stenosis: Secondary | ICD-10-CM

## 2011-11-25 ENCOUNTER — Other Ambulatory Visit: Payer: Self-pay

## 2011-11-25 MED ORDER — ATORVASTATIN CALCIUM 20 MG PO TABS: 20.0000 mg | ORAL_TABLET | Freq: Every day | ORAL | Status: DC

## 2011-12-08 ENCOUNTER — Ambulatory Visit (INDEPENDENT_AMBULATORY_CARE_PROVIDER_SITE_OTHER): Payer: Medicare Other | Admitting: *Deleted

## 2011-12-08 DIAGNOSIS — I359 Nonrheumatic aortic valve disorder, unspecified: Secondary | ICD-10-CM

## 2011-12-08 DIAGNOSIS — I350 Nonrheumatic aortic (valve) stenosis: Secondary | ICD-10-CM

## 2012-01-05 ENCOUNTER — Ambulatory Visit (INDEPENDENT_AMBULATORY_CARE_PROVIDER_SITE_OTHER): Payer: Medicare Other

## 2012-01-05 DIAGNOSIS — I350 Nonrheumatic aortic (valve) stenosis: Secondary | ICD-10-CM

## 2012-01-05 DIAGNOSIS — I359 Nonrheumatic aortic valve disorder, unspecified: Secondary | ICD-10-CM

## 2012-01-05 LAB — POCT INR: INR: 2.8

## 2012-01-21 ENCOUNTER — Encounter: Payer: Self-pay | Admitting: Internal Medicine

## 2012-01-21 ENCOUNTER — Ambulatory Visit (INDEPENDENT_AMBULATORY_CARE_PROVIDER_SITE_OTHER): Payer: Medicare Other | Admitting: *Deleted

## 2012-01-21 DIAGNOSIS — I495 Sick sinus syndrome: Secondary | ICD-10-CM

## 2012-01-26 LAB — REMOTE PACEMAKER DEVICE
AL AMPLITUDE: 2.8 mv
AL THRESHOLD: 0.75 V
BAMS-0001: 175 {beats}/min
RV LEAD AMPLITUDE: 22.4 mv
RV LEAD THRESHOLD: 0.625 V

## 2012-02-02 ENCOUNTER — Ambulatory Visit (INDEPENDENT_AMBULATORY_CARE_PROVIDER_SITE_OTHER): Payer: Medicare Other | Admitting: *Deleted

## 2012-02-02 DIAGNOSIS — I359 Nonrheumatic aortic valve disorder, unspecified: Secondary | ICD-10-CM

## 2012-02-02 DIAGNOSIS — I350 Nonrheumatic aortic (valve) stenosis: Secondary | ICD-10-CM

## 2012-02-03 ENCOUNTER — Other Ambulatory Visit: Payer: Self-pay | Admitting: Cardiovascular Disease

## 2012-02-03 NOTE — Telephone Encounter (Signed)
Fax Received. Refill Completed. Donald [name] (R.M.A)   

## 2012-02-19 ENCOUNTER — Encounter: Payer: Self-pay | Admitting: *Deleted

## 2012-03-15 ENCOUNTER — Ambulatory Visit (INDEPENDENT_AMBULATORY_CARE_PROVIDER_SITE_OTHER): Payer: Medicare Other | Admitting: *Deleted

## 2012-03-15 DIAGNOSIS — I359 Nonrheumatic aortic valve disorder, unspecified: Secondary | ICD-10-CM

## 2012-03-15 DIAGNOSIS — I350 Nonrheumatic aortic (valve) stenosis: Secondary | ICD-10-CM

## 2012-03-23 ENCOUNTER — Other Ambulatory Visit: Payer: Self-pay | Admitting: *Deleted

## 2012-03-23 MED ORDER — WARFARIN SODIUM 5 MG PO TABS: ORAL_TABLET | ORAL | Status: DC

## 2012-03-29 ENCOUNTER — Encounter: Payer: Self-pay | Admitting: *Deleted

## 2012-03-29 DIAGNOSIS — Z950 Presence of cardiac pacemaker: Secondary | ICD-10-CM | POA: Insufficient documentation

## 2012-04-01 DIAGNOSIS — I951 Orthostatic hypotension: Secondary | ICD-10-CM | POA: Insufficient documentation

## 2012-04-07 ENCOUNTER — Encounter: Payer: Self-pay | Admitting: Internal Medicine

## 2012-04-07 ENCOUNTER — Ambulatory Visit (INDEPENDENT_AMBULATORY_CARE_PROVIDER_SITE_OTHER): Payer: Medicare Other | Admitting: Internal Medicine

## 2012-04-07 VITALS — BP 133/80 | HR 65 | Ht 70.0 in | Wt 181.0 lb

## 2012-04-07 DIAGNOSIS — Z950 Presence of cardiac pacemaker: Secondary | ICD-10-CM

## 2012-04-07 DIAGNOSIS — I495 Sick sinus syndrome: Secondary | ICD-10-CM

## 2012-04-07 DIAGNOSIS — I10 Essential (primary) hypertension: Secondary | ICD-10-CM

## 2012-04-07 LAB — PACEMAKER DEVICE OBSERVATION
AL IMPEDENCE PM: 486 Ohm
ATRIAL PACING PM: 66
BAMS-0001: 175 {beats}/min
BATTERY VOLTAGE: 2.79 V

## 2012-04-07 NOTE — Progress Notes (Signed)
PCP: Gaspar Garbe, MD  Donald [name] is a 71 y.o. male who presents today for routine electrophysiology followup.  Since last being seen in our clinic, the patient reports doing very well.  Today, he denies symptoms of palpitations, chest pain, shortness of breath,  lower extremity edema, dizziness, presyncope, or syncope. He feels that his Parkinsons is stable.  The patient is otherwise without complaint today.   Past Medical History  Diagnosis Date  . Hyperlipidemia   . Hypertension   . Aortic stenosis     s/p AVR by Dr Laneta Simmers  . Hyperthyroidism   . Hiatal hernia   . Right kidney stone     hx of  . Sick sinus syndrome     s/p PPM (MDT) 2006  . Atrial fibrillation     paroxysmal  . H/O: rheumatic fever   . CAD (coronary artery disease)     s/p CABG 2006  . Parkinson's disease    Past Surgical History  Procedure Date  . Insert / replace / remove pacemaker 2006, 2012    MDT for sick sinus syndrome, gen change 8/12 by JA  . Coronary artery bypass graft 2006  . Aortic valve replacement 2006    Current Outpatient Prescriptions  Medication Sig Dispense Refill  . aspirin 81 MG tablet Take 81 mg by mouth daily.        Marland Kitchen atorvastatin (LIPITOR) 20 MG tablet Take 1 tablet (20 mg total) by mouth daily.  90 tablet  1  . levothyroxine (SYNTHROID, LEVOTHROID) 125 MCG tablet Take 125 mcg by mouth daily.        . metoprolol (TOPROL-XL) 50 MG 24 hr tablet Take 1 tablet (50 mg total) by mouth daily.  90 tablet  1  . NITROSTAT 0.4 MG SL tablet PLACE 1 TABLET UNDER THE TONGUE EVERY 5 MINUTES UP TO 3 DOSES AS NEEDED FOR CHEST PAIN.  25 each  4  . rOPINIRole (REQUIP) 0.25 MG tablet 1 mg 3 (three) times daily.   90 tablet  11  . trihexyphenidyl (ARTANE) 5 MG tablet Take 5 mg by mouth 2 (two) times daily with a meal.        . warfarin (COUMADIN) 5 MG tablet Take as directed by anticoagulation clinic  135 tablet  1    Physical Exam: Filed Vitals:   04/07/12 0948  BP: 133/80  Pulse: 65    Height: 5\' 10"  (1.778 m)  Weight: 181 lb (82.101 kg)    GEN- The patient is well appearing, alert and oriented x 3 today.   Head- normocephalic, atraumatic Eyes-  Sclera clear, conjunctiva pink Ears- hearing intact Oropharynx- clear Lungs- Clear to ausculation bilaterally, normal work of breathing Chest- pacemaker pocket is well healed Heart- Regular rate and rhythm, mechanical S2 GI- soft, NT, ND, + BS Extremities- no clubbing, cyanosis, or edema  Pacemaker interrogation- reviewed in detail today,  See PACEART report  Assessment and Plan: SICK SINUS SYNDROME  Normal pacemaker function  See Pace Art report  No changes today  TTMs every 3 months, return in 12 months  AORTIC STENOSIS  Doing well s/p AVR  Continue coumadin  HYPERTENSION   Stable  No change required today   Follow-up with Donald [name] in 6 months I will see again in 1year

## 2012-04-07 NOTE — Patient Instructions (Addendum)
Remote monitoring is used to monitor your Pacemaker of ICD from home. This monitoring reduces the number of office visits required to check your device to one time per year. It allows [place] to keep an eye on the functioning of your device to ensure it is working properly. You are scheduled for a device check from home on July 11, 2012. You may send your transmission at any time that day. If you have a wireless device, the transmission will be sent automatically. After your physician reviews your transmission, you will receive a postcard with your next transmission date.  Your physician wants you to follow-up in: 6 months with [name] and 1 year with Dr Johney Frame.  You will receive a reminder letter in the mail two months in advance. If you don't receive a letter, please call our office to schedule the follow-up appointment.

## 2012-04-26 ENCOUNTER — Ambulatory Visit (INDEPENDENT_AMBULATORY_CARE_PROVIDER_SITE_OTHER): Payer: Medicare Other | Admitting: *Deleted

## 2012-04-26 DIAGNOSIS — I359 Nonrheumatic aortic valve disorder, unspecified: Secondary | ICD-10-CM

## 2012-04-26 DIAGNOSIS — I350 Nonrheumatic aortic (valve) stenosis: Secondary | ICD-10-CM

## 2012-04-26 LAB — POCT INR: INR: 3

## 2012-05-26 ENCOUNTER — Other Ambulatory Visit: Payer: Self-pay | Admitting: Cardiology

## 2012-05-26 MED ORDER — METOPROLOL SUCCINATE ER 50 MG PO TB24: 50.0000 mg | ORAL_TABLET | Freq: Every day | ORAL | Status: DC

## 2012-05-27 ENCOUNTER — Other Ambulatory Visit: Payer: Self-pay

## 2012-05-27 MED ORDER — ATORVASTATIN CALCIUM 20 MG PO TABS: 20.0000 mg | ORAL_TABLET | Freq: Every day | ORAL | Status: DC

## 2012-06-01 ENCOUNTER — Encounter: Payer: Self-pay | Admitting: Internal Medicine

## 2012-06-07 ENCOUNTER — Ambulatory Visit (INDEPENDENT_AMBULATORY_CARE_PROVIDER_SITE_OTHER): Payer: Medicare Other

## 2012-06-07 DIAGNOSIS — I350 Nonrheumatic aortic (valve) stenosis: Secondary | ICD-10-CM

## 2012-06-07 DIAGNOSIS — I359 Nonrheumatic aortic valve disorder, unspecified: Secondary | ICD-10-CM

## 2012-07-11 ENCOUNTER — Other Ambulatory Visit: Payer: Self-pay | Admitting: Internal Medicine

## 2012-07-11 ENCOUNTER — Ambulatory Visit (INDEPENDENT_AMBULATORY_CARE_PROVIDER_SITE_OTHER): Payer: Medicare Other | Admitting: *Deleted

## 2012-07-11 DIAGNOSIS — Z950 Presence of cardiac pacemaker: Secondary | ICD-10-CM

## 2012-07-11 DIAGNOSIS — I495 Sick sinus syndrome: Secondary | ICD-10-CM

## 2012-07-19 LAB — REMOTE PACEMAKER DEVICE
AL AMPLITUDE: 2.8 mv
AL IMPEDENCE PM: 486 Ohm
AL THRESHOLD: 0.625 v
ATRIAL PACING PM: 70
BAMS-0001: 175 {beats}/min
BATTERY VOLTAGE: 2.79 v
RV LEAD AMPLITUDE: 22.4 mv
RV LEAD IMPEDENCE PM: 437 Ohm
RV LEAD THRESHOLD: 0.625 v
VENTRICULAR PACING PM: 2

## 2012-07-21 ENCOUNTER — Ambulatory Visit (INDEPENDENT_AMBULATORY_CARE_PROVIDER_SITE_OTHER): Payer: Medicare Other

## 2012-07-21 DIAGNOSIS — I359 Nonrheumatic aortic valve disorder, unspecified: Secondary | ICD-10-CM

## 2012-07-21 DIAGNOSIS — I350 Nonrheumatic aortic (valve) stenosis: Secondary | ICD-10-CM

## 2012-07-29 ENCOUNTER — Encounter: Payer: Self-pay | Admitting: *Deleted

## 2012-08-01 ENCOUNTER — Encounter: Payer: Self-pay | Admitting: Internal Medicine

## 2012-08-30 ENCOUNTER — Ambulatory Visit (INDEPENDENT_AMBULATORY_CARE_PROVIDER_SITE_OTHER): Payer: Medicare Other | Admitting: *Deleted

## 2012-08-30 DIAGNOSIS — I350 Nonrheumatic aortic (valve) stenosis: Secondary | ICD-10-CM

## 2012-08-30 DIAGNOSIS — I359 Nonrheumatic aortic valve disorder, unspecified: Secondary | ICD-10-CM

## 2012-09-20 ENCOUNTER — Other Ambulatory Visit: Payer: Self-pay | Admitting: *Deleted

## 2012-09-20 MED ORDER — WARFARIN SODIUM 5 MG PO TABS: ORAL_TABLET | ORAL | Status: DC

## 2012-09-26 ENCOUNTER — Encounter: Payer: Self-pay | Admitting: Neurology

## 2012-09-26 DIAGNOSIS — I951 Orthostatic hypotension: Secondary | ICD-10-CM

## 2012-09-27 ENCOUNTER — Ambulatory Visit (INDEPENDENT_AMBULATORY_CARE_PROVIDER_SITE_OTHER): Payer: Medicare Other | Admitting: Neurology

## 2012-09-27 ENCOUNTER — Encounter: Payer: Self-pay | Admitting: Neurology

## 2012-09-27 VITALS — BP 121/72 | HR 66 | Ht 68.5 in | Wt 182.0 lb

## 2012-09-27 DIAGNOSIS — G20A1 Parkinson's disease without dyskinesia, without mention of fluctuations: Secondary | ICD-10-CM

## 2012-09-27 DIAGNOSIS — G20 Parkinson's disease: Secondary | ICD-10-CM

## 2012-09-27 NOTE — Progress Notes (Signed)
Reason for visit: Parkinson's disease  Donald [name] is an 72 y.o. male  History of present illness:  Donald [name] is a 72 year old left-handed white male with a history of Parkinson's disease primarily with right-sided features. The patient has a very prominent tremor that affects the right upper extremity, jaw, and right leg. The patient is remaining quite active, and he walks on a regular basis. The patient denies any falls. The patient is not having problems with chewing or swallowing. The patient denies any new medical issues that have come up since last seen. The patient is on a maximum dose of Artane, without benefit with the tremor. The patient is on low-dose Requip, taking 1 mg 3 times daily. The patient is tolerating these medications well.  Past Medical History  Diagnosis Date  . Hyperlipidemia   . Hypertension   . Aortic stenosis     s/p AVR by Dr Laneta Simmers  . Hyperthyroidism   . Hiatal hernia   . Right kidney stone     hx of  . Sick sinus syndrome     s/p PPM (MDT) 2006  . Atrial fibrillation     paroxysmal  . H/O: rheumatic fever   . CAD (coronary artery disease)     s/p CABG 2006  . Parkinson's disease   . Hearing deficit     Bilateral hearing aids    Past Surgical History  Procedure Laterality Date  . Insert / replace / remove pacemaker  2006, 2012    MDT for sick sinus syndrome, gen change 8/12 by Donald [name]  . Coronary artery bypass graft  2006  . Aortic valve replacement  2006    Family History  Problem Relation Age of Onset  . Coronary artery disease      family hx of  . Dementia Mother   . Parkinsonism Mother   . Heart disease Father   . Heart disease Sister   . Heart disease Brother   . Tremor Brother     Unilateral tremor    Social history:  reports that he has never smoked. He does not have any smokeless tobacco history on file. He reports that he does not drink alcohol or use illicit drugs.  Allergies: No Known Allergies  Medications:  Current  Outpatient Prescriptions on File Prior to Visit  Medication Sig Dispense Refill  . aspirin 81 MG tablet Take 81 mg by mouth daily.        Marland Kitchen atorvastatin (LIPITOR) 20 MG tablet Take 1 tablet (20 mg total) by mouth daily.  90 tablet  1  . levothyroxine (SYNTHROID, LEVOTHROID) 125 MCG tablet Take 125 mcg by mouth daily.        Marland Kitchen NITROSTAT 0.4 MG SL tablet PLACE 1 TABLET UNDER THE TONGUE EVERY 5 MINUTES UP TO 3 DOSES AS NEEDED FOR CHEST PAIN.  25 each  4  . trihexyphenidyl (ARTANE) 5 MG tablet Take 5 mg by mouth 3 (three) times daily with meals.       . warfarin (COUMADIN) 5 MG tablet Take as directed by anticoagulation clinic  135 tablet  1   No current facility-administered medications on file prior to visit.    ROS:  Out of a complete 14 system review of symptoms, the patient complains only of the following symptoms, and all other reviewed systems are negative.  Heart murmur Hearing loss Snoring Tremor  Blood pressure 121/72, pulse 66, height 5' 8.5" (1.74 m), weight 182 lb (82.555 kg).  Physical Exam  General: The patient is alert and cooperative at the time of the examination. Mild masking of the face is seen.  Skin: No significant peripheral edema is noted.   Neurologic Exam  Cranial nerves: Facial symmetry is present. Speech is normal, no aphasia or dysarthria is noted. Extraocular movements are full, with the exception of mild restriction of superior gaze. Visual fields are full. A jaw tremor is seen.  Motor: The patient has good strength in all 4 extremities.  Coordination: The patient has good finger-nose-finger and heel-to-shin bilaterally. The patient has prominent resting tremors involving the right arm, right leg.  Gait and station: The patient has a normal gait. With ambulation, the patient has decreased arm swing on the right, and a prominent resting tremor on the right. The patient does not use a cane or a walker for ambulation. Tandem gait is normal. Romberg is  negative. No drift is seen.  Reflexes: Deep tendon reflexes are symmetric.   Assessment/Plan:  One. Parkinson's disease with right-sided features  The patient is doing fairly well with his mobility, and he is on a very low-dose Requip. The patient mainly has a very prominent resting tremor involving the entire right side of the body. I discussed the possibility of a deep brain stimulator, as I suspect this is the only treatment option that would significantly impact the tremor. The patient is on a maximum dose of Artane. The patient will contact me if he desires a referral for evaluation of a deep brain stimulator. The patient will followup in 6 months.  Donald Palau MD 09/27/2012 7:58 PM  [facility] [address] [place], [place] [address]  Phone [phone] Fax [phone]

## 2012-10-03 ENCOUNTER — Encounter: Payer: Self-pay | Admitting: Internal Medicine

## 2012-10-11 ENCOUNTER — Ambulatory Visit (INDEPENDENT_AMBULATORY_CARE_PROVIDER_SITE_OTHER): Payer: Medicare Other | Admitting: *Deleted

## 2012-10-11 DIAGNOSIS — I359 Nonrheumatic aortic valve disorder, unspecified: Secondary | ICD-10-CM

## 2012-10-11 DIAGNOSIS — I495 Sick sinus syndrome: Secondary | ICD-10-CM

## 2012-10-11 DIAGNOSIS — Z950 Presence of cardiac pacemaker: Secondary | ICD-10-CM

## 2012-10-11 DIAGNOSIS — I350 Nonrheumatic aortic (valve) stenosis: Secondary | ICD-10-CM

## 2012-10-11 LAB — POCT INR: INR: 3.4

## 2012-10-17 LAB — REMOTE PACEMAKER DEVICE
AL IMPEDENCE PM: 472 Ohm
AL THRESHOLD: 0.625 V
RV LEAD AMPLITUDE: 22.4 mv
RV LEAD IMPEDENCE PM: 448 Ohm
RV LEAD THRESHOLD: 0.625 V

## 2012-10-26 ENCOUNTER — Encounter: Payer: Self-pay | Admitting: *Deleted

## 2012-11-22 ENCOUNTER — Ambulatory Visit (INDEPENDENT_AMBULATORY_CARE_PROVIDER_SITE_OTHER): Payer: Medicare Other | Admitting: *Deleted

## 2012-11-22 DIAGNOSIS — I359 Nonrheumatic aortic valve disorder, unspecified: Secondary | ICD-10-CM

## 2012-11-22 DIAGNOSIS — I350 Nonrheumatic aortic (valve) stenosis: Secondary | ICD-10-CM

## 2012-12-28 ENCOUNTER — Other Ambulatory Visit: Payer: Self-pay

## 2012-12-28 MED ORDER — ATORVASTATIN CALCIUM 20 MG PO TABS: 20.0000 mg | ORAL_TABLET | Freq: Every day | ORAL | Status: DC

## 2013-01-03 ENCOUNTER — Ambulatory Visit (INDEPENDENT_AMBULATORY_CARE_PROVIDER_SITE_OTHER): Payer: Medicare Other | Admitting: *Deleted

## 2013-01-03 DIAGNOSIS — I359 Nonrheumatic aortic valve disorder, unspecified: Secondary | ICD-10-CM

## 2013-01-03 DIAGNOSIS — I350 Nonrheumatic aortic (valve) stenosis: Secondary | ICD-10-CM

## 2013-01-09 ENCOUNTER — Other Ambulatory Visit: Payer: Self-pay

## 2013-01-09 MED ORDER — ATORVASTATIN CALCIUM 20 MG PO TABS: 20.0000 mg | ORAL_TABLET | Freq: Every day | ORAL | Status: DC

## 2013-01-17 ENCOUNTER — Ambulatory Visit (INDEPENDENT_AMBULATORY_CARE_PROVIDER_SITE_OTHER): Payer: Medicare Other | Admitting: *Deleted

## 2013-01-17 DIAGNOSIS — I495 Sick sinus syndrome: Secondary | ICD-10-CM

## 2013-01-17 DIAGNOSIS — Z950 Presence of cardiac pacemaker: Secondary | ICD-10-CM

## 2013-01-20 ENCOUNTER — Ambulatory Visit (INDEPENDENT_AMBULATORY_CARE_PROVIDER_SITE_OTHER): Payer: Medicare Other | Admitting: *Deleted

## 2013-01-20 DIAGNOSIS — I350 Nonrheumatic aortic (valve) stenosis: Secondary | ICD-10-CM

## 2013-01-20 DIAGNOSIS — I359 Nonrheumatic aortic valve disorder, unspecified: Secondary | ICD-10-CM

## 2013-01-24 ENCOUNTER — Telehealth: Payer: Self-pay | Admitting: Internal Medicine

## 2013-01-24 NOTE — Telephone Encounter (Signed)
error 

## 2013-01-26 ENCOUNTER — Telehealth: Payer: Self-pay | Admitting: Internal Medicine

## 2013-01-26 NOTE — Telephone Encounter (Signed)
Spoke w/Tamar at Dr. Theora Gianotti office.  Advised that Dr. Johney Frame will not be back in office till next week and will fax form at that time. Will forward note to [facility]

## 2013-01-26 NOTE — Telephone Encounter (Signed)
New problem    Status of form that was fax over on  9/9 .

## 2013-01-27 LAB — REMOTE PACEMAKER DEVICE
AL THRESHOLD: 0.625 V
BAMS-0001: 175 {beats}/min
RV LEAD AMPLITUDE: 22.4 mv
RV LEAD THRESHOLD: 0.75 V

## 2013-01-30 NOTE — Telephone Encounter (Signed)
Was originally faxed on 01/24/13 and will refax today

## 2013-02-03 ENCOUNTER — Ambulatory Visit (INDEPENDENT_AMBULATORY_CARE_PROVIDER_SITE_OTHER): Payer: Medicare Other | Admitting: *Deleted

## 2013-02-03 DIAGNOSIS — I359 Nonrheumatic aortic valve disorder, unspecified: Secondary | ICD-10-CM

## 2013-02-03 DIAGNOSIS — I350 Nonrheumatic aortic (valve) stenosis: Secondary | ICD-10-CM

## 2013-02-07 ENCOUNTER — Ambulatory Visit (INDEPENDENT_AMBULATORY_CARE_PROVIDER_SITE_OTHER): Payer: Medicare Other

## 2013-02-07 DIAGNOSIS — I359 Nonrheumatic aortic valve disorder, unspecified: Secondary | ICD-10-CM

## 2013-02-07 DIAGNOSIS — I350 Nonrheumatic aortic (valve) stenosis: Secondary | ICD-10-CM

## 2013-02-08 ENCOUNTER — Encounter: Payer: Self-pay | Admitting: *Deleted

## 2013-02-15 ENCOUNTER — Other Ambulatory Visit: Payer: Self-pay | Admitting: Cardiovascular Disease

## 2013-02-17 ENCOUNTER — Encounter: Payer: Self-pay | Admitting: Internal Medicine

## 2013-02-21 ENCOUNTER — Ambulatory Visit (INDEPENDENT_AMBULATORY_CARE_PROVIDER_SITE_OTHER): Payer: Medicare Other | Admitting: General Practice

## 2013-02-21 DIAGNOSIS — I359 Nonrheumatic aortic valve disorder, unspecified: Secondary | ICD-10-CM

## 2013-02-21 DIAGNOSIS — I350 Nonrheumatic aortic (valve) stenosis: Secondary | ICD-10-CM

## 2013-02-21 LAB — POCT INR: INR: 3.7

## 2013-03-10 ENCOUNTER — Ambulatory Visit (INDEPENDENT_AMBULATORY_CARE_PROVIDER_SITE_OTHER): Payer: Medicare Other | Admitting: *Deleted

## 2013-03-10 DIAGNOSIS — I350 Nonrheumatic aortic (valve) stenosis: Secondary | ICD-10-CM

## 2013-03-10 DIAGNOSIS — I359 Nonrheumatic aortic valve disorder, unspecified: Secondary | ICD-10-CM

## 2013-03-10 LAB — POCT INR: INR: 3.5

## 2013-03-30 ENCOUNTER — Encounter (INDEPENDENT_AMBULATORY_CARE_PROVIDER_SITE_OTHER): Payer: Self-pay

## 2013-03-30 ENCOUNTER — Encounter: Payer: Self-pay | Admitting: Neurology

## 2013-03-30 ENCOUNTER — Ambulatory Visit (INDEPENDENT_AMBULATORY_CARE_PROVIDER_SITE_OTHER): Payer: Medicare Other | Admitting: Neurology

## 2013-03-30 VITALS — BP 133/76 | HR 69 | Ht 68.0 in | Wt 183.0 lb

## 2013-03-30 DIAGNOSIS — G20 Parkinson's disease: Secondary | ICD-10-CM

## 2013-03-30 NOTE — Patient Instructions (Signed)
Will consider Azilect in the future.   Cut back on the Trihexyphenidyl to 5 mg twice a day for 3 weeks, if no change in the tremor, cut back to one tablet a day.    Parkinson Disease Parkinson disease is a disorder of the central nervous system, which includes the brain and spinal cord. A person with this disease slowly loses the ability to completely control body movements. Within the brain, there is a group of nerve cells (basal ganglia) that help control movement. The basal ganglia are damaged and do not work properly in a person with Parkinson disease. In addition, the basal ganglia produce and use a brain chemical called dopamine. The dopamine chemical sends messages to other parts of the body to control and coordinate body movements. Dopamine levels are low in a person with Parkinson disease. If the dopamine levels are low, then the body does not receive the correct messages it needs to move normally.  CAUSES  The exact reason why the basal ganglia get damaged is not known. Some medical researchers have thought that infection, genes, environment, and certain medicines may contribute to the cause.  SYMPTOMS   An early symptom of Parkinson disease is often an uncontrolled shaking (tremor) of the hands. The tremor will often disappear when the affected hand is consciously used.  As the disease progresses, walking, talking, getting out of a chair, and new movements become more difficult.  Muscles get stiff and movements become slower.  Balance and coordination become harder.  Depression, trouble swallowing, urinary problems, constipation, and sleep problems can occur.  Later in the disease, memory and thought processes may deteriorate. DIAGNOSIS  There are no specific tests to diagnose Parkinson disease. You may be referred to a neurologist for evaluation. Your caregiver will ask about your medical history, symptoms, and perform a physical exam. Blood tests and imaging tests of your brain  may be performed to rule out other diseases. The imaging tests may include an MRI or a CT scan. TREATMENT  The goal of treatment is to relieve symptoms. Medicines may be prescribed once the symptoms become troublesome. Medicine will not stop the progression of the disease, but medicine can make movement and balance better and help control tremors. Speech and occupational therapy may also be prescribed. Sometimes, surgical treatment of the brain can be done in young people. HOME CARE INSTRUCTIONS  Get regular exercise and rest periods during the day to help prevent exhaustion and depression.  If getting dressed becomes difficult, replace buttons and zippers with Velcro and elastic on your clothing.  Take all medicine as directed by your caregiver.  Install grab bars or railings in your home to prevent falls.  Go to speech or occupational therapy as directed.  Keep all follow-up visits as directed by your caregiver. SEEK MEDICAL CARE IF:  Your symptoms are not controlled with your medicine.  You fall.  You have trouble swallowing or choke on your food. MAKE SURE YOU:  Understand these instructions.  Will watch your condition.  Will get help right away if you are not doing well or get worse. Document Released: 05/01/2000 Document Revised: 08/29/2012 Document Reviewed: 06/03/2011 [facility] Patient Information 2014 [place], [place].

## 2013-03-30 NOTE — Progress Notes (Signed)
Reason for visit: Parkinson's disease  Donald [name] is an 72 y.o. male  History of present illness:  Donald [name] is a 72 year old left-handed white male with a history of Parkinson's disease primarily with right-sided features. The patient indicates that he remains active, walking 5-6 miles a day, and he does yard work on a regular basis. The patient has been on maximum dose of Artane, taking 5 mg 3 times daily. The patient has not sure that the tremor has been benefited whatsoever with this medication. The patient is having some issues with memory on the medication. The patient sleeps well at night, and he denies vivid dreams or hallucinations. The patient has not had any falls. His mobility remains excellent. The patient denies any problems with swallowing or choking. No other new medical issues have come up since last seen.  Past Medical History  Diagnosis Date  . Hyperlipidemia   . Hypertension   . Aortic stenosis     s/p AVR by Dr Laneta Simmers  . Hyperthyroidism   . Hiatal hernia   . Right kidney stone     hx of  . Sick sinus syndrome     s/p PPM (MDT) 2006  . Atrial fibrillation     paroxysmal  . H/O: rheumatic fever   . CAD (coronary artery disease)     s/p CABG 2006  . Parkinson's disease   . Hearing deficit     Bilateral hearing aids    Past Surgical History  Procedure Laterality Date  . Insert / replace / remove pacemaker  2006, 2012    MDT for sick sinus syndrome, gen change 8/12 by JA  . Coronary artery bypass graft  2006  . Aortic valve replacement  2006    Family History  Problem Relation Age of Onset  . Coronary artery disease      family hx of  . Dementia Mother   . Parkinsonism Mother   . Heart disease Father   . Heart disease Sister   . Heart disease Brother   . Tremor Brother     Unilateral tremor    Social history:  reports that he has never smoked. He does not have any smokeless tobacco history on file. He reports that he does not drink alcohol  or use illicit drugs.   No Known Allergies  Medications:  Current Outpatient Prescriptions on File Prior to Visit  Medication Sig Dispense Refill  . aspirin 81 MG tablet Take 81 mg by mouth daily.        Marland Kitchen atorvastatin (LIPITOR) 20 MG tablet Take 1 tablet (20 mg total) by mouth daily.  90 tablet  3  . levothyroxine (SYNTHROID, LEVOTHROID) 125 MCG tablet Take 125 mcg by mouth daily.        Marland Kitchen NITROSTAT 0.4 MG SL tablet PLACE 1 TABLET UNDER THE TONGUE EVERY 5 MINUTES UP TO 3 DOSES AS NEEDED FOR CHEST PAIN  25 tablet  1  . rOPINIRole (REQUIP) 1 MG tablet Take 1 mg by mouth 3 (three) times daily.      . trihexyphenidyl (ARTANE) 5 MG tablet Take 5 mg by mouth 3 (three) times daily with meals.       . warfarin (COUMADIN) 5 MG tablet Take as directed by anticoagulation clinic  135 tablet  1   No current facility-administered medications on file prior to visit.    ROS:  Out of a complete 14 system review of symptoms, the patient complains only of the following  symptoms, and all other reviewed systems are negative.  Heart murmur Hearing loss Snoring Easy bleeding Memory loss, tremor  Blood pressure 133/76, pulse 69, height 5\' 8"  (1.727 m), weight 183 lb (83.008 kg).  Physical Exam  General: The patient is alert and cooperative at the time of the examination.  Skin: No significant peripheral edema is noted.   Neurologic Exam  Mental status: The Mini-Mental status examination done today shows a total score 24/30.  Cranial nerves: Facial symmetry is present. Speech is normal, no aphasia or dysarthria is noted. Extraocular movements are full, with the exception that there is some restriction of superior gaze. Visual fields are full. Masking of the face is seen.  Motor: The patient has good strength in all 4 extremities.  Sensory examination: Soft touch sensation on the face, arms, and legs is normal.  Coordination: The patient has good finger-nose-finger and heel-to-shin bilaterally.  A prominent resting tremor seen on the right upper and right lower extremities.  Gait and station: The patient has a normal gait. The patient is able to arise from a seated position with arms crossed. The patient has good arm swing on the left side. While walking, the patient has decreased arm swing on the right, with tremor. Tandem gait is slightly unsteady. Romberg is negative. No drift is seen.  Reflexes: Deep tendon reflexes are symmetric.   Assessment/Plan:  One. Parkinson's disease, right-sided features  The patient is on Requip in low dose taking 1 mg 3 times daily. I discussed the possibility of adding Azilect, but the patient may not be able to afford this medication. The patient is not definitely gaining any benefit from the Artane, and we may reduce the dose to 5 mg twice daily to see if there is any worsening of tremor. If not, the patient will go down to 1 tablet a day for 3 weeks, and then stop the medication. The patient will followup in 5 months. I have once again discussed the possibility of a deep brain stimulator, as the patient clearly has unilateral symptoms with a prominent tremor, and he likely would benefit from this procedure significantly. The memory issues will need to be followed, hopefully they will improve off of the Artane.  Marlan Palau MD 03/30/2013 8:38 AM  [facility] [address] [place], [place] [address]  Phone [phone] Fax [phone]

## 2013-04-05 ENCOUNTER — Encounter: Payer: Self-pay | Admitting: Internal Medicine

## 2013-04-05 ENCOUNTER — Ambulatory Visit (INDEPENDENT_AMBULATORY_CARE_PROVIDER_SITE_OTHER): Payer: Medicare Other | Admitting: Internal Medicine

## 2013-04-05 ENCOUNTER — Ambulatory Visit (INDEPENDENT_AMBULATORY_CARE_PROVIDER_SITE_OTHER): Payer: Medicare Other | Admitting: *Deleted

## 2013-04-05 VITALS — BP 138/78 | HR 62 | Ht 68.0 in | Wt 184.0 lb

## 2013-04-05 DIAGNOSIS — Z950 Presence of cardiac pacemaker: Secondary | ICD-10-CM

## 2013-04-05 DIAGNOSIS — I10 Essential (primary) hypertension: Secondary | ICD-10-CM

## 2013-04-05 DIAGNOSIS — I350 Nonrheumatic aortic (valve) stenosis: Secondary | ICD-10-CM

## 2013-04-05 DIAGNOSIS — I2510 Atherosclerotic heart disease of native coronary artery without angina pectoris: Secondary | ICD-10-CM

## 2013-04-05 DIAGNOSIS — I495 Sick sinus syndrome: Secondary | ICD-10-CM

## 2013-04-05 DIAGNOSIS — Z954 Presence of other heart-valve replacement: Secondary | ICD-10-CM

## 2013-04-05 DIAGNOSIS — Z952 Presence of prosthetic heart valve: Secondary | ICD-10-CM | POA: Insufficient documentation

## 2013-04-05 DIAGNOSIS — I359 Nonrheumatic aortic valve disorder, unspecified: Secondary | ICD-10-CM

## 2013-04-05 LAB — MDC_IDC_ENUM_SESS_TYPE_INCLINIC
Battery Voltage: 2.78 V
Brady Statistic AP VS Percent: 70.4 %
Brady Statistic AS VS Percent: 28 %
Lead Channel Impedance Value: 457 Ohm
Lead Channel Pacing Threshold Amplitude: 0.625 V
Lead Channel Pacing Threshold Amplitude: 0.625 V
Lead Channel Pacing Threshold Pulse Width: 0.4 ms
Lead Channel Sensing Intrinsic Amplitude: 11.2 mV
Lead Channel Sensing Intrinsic Amplitude: 2.8 mV
Lead Channel Setting Pacing Amplitude: 2 V
Lead Channel Setting Pacing Pulse Width: 0.4 ms

## 2013-04-05 LAB — POCT INR: INR: 3.7

## 2013-04-05 NOTE — Progress Notes (Signed)
PCP: Gaspar Garbe, MD Neurologist: Brydon Spahr is a 72 y.o. male who presents today for routine electrophysiology followup.  Since last being seen in our clinic, the patient reports doing very well.  The patient is followed by Dr Anne Hahn for his Parkinson's disease. There have been discussions about deep brain stimulator. He walks 3 miles per day.  Today, he denies symptoms of palpitations, chest pain, shortness of breath,  lower extremity edema, dizziness, presyncope, or syncope.  The patient is otherwise without complaint today.   Past Medical History  Diagnosis Date  . Hyperlipidemia   . Hypertension   . Aortic stenosis     s/p AVR by Dr Laneta Simmers  . Hyperthyroidism   . Hiatal hernia   . Right kidney stone     hx of  . Sick sinus syndrome     s/p PPM (MDT) 2006  . Atrial fibrillation     paroxysmal  . H/O: rheumatic fever   . CAD (coronary artery disease)     s/p CABG 2006  . Parkinson's disease   . Hearing deficit     Bilateral hearing aids   Past Surgical History  Procedure Laterality Date  . Insert / replace / remove pacemaker  2006, 2012    MDT for sick sinus syndrome, gen change 8/12 by JA  . Coronary artery bypass graft  2006  . Aortic valve replacement  2006    Current Outpatient Prescriptions  Medication Sig Dispense Refill  . aspirin 81 MG tablet Take 81 mg by mouth daily.        Marland Kitchen atorvastatin (LIPITOR) 20 MG tablet Take 1 tablet (20 mg total) by mouth daily.  90 tablet  3  . levothyroxine (SYNTHROID, LEVOTHROID) 125 MCG tablet Take 125 mcg by mouth daily.        . metoprolol succinate (TOPROL-XL) 50 MG 24 hr tablet Take 50 mg by mouth daily.      Marland Kitchen NITROSTAT 0.4 MG SL tablet PLACE 1 TABLET UNDER THE TONGUE EVERY 5 MINUTES UP TO 3 DOSES AS NEEDED FOR CHEST PAIN  25 tablet  1  . rOPINIRole (REQUIP) 1 MG tablet Take 1 mg by mouth 3 (three) times daily.      . trihexyphenidyl (ARTANE) 5 MG tablet Take 5 mg by mouth 2 (two) times daily with a meal.        . warfarin (COUMADIN) 5 MG tablet Take as directed by anticoagulation clinic  135 tablet  1   No current facility-administered medications for this visit.    Physical Exam: Filed Vitals:   04/05/13 0844  BP: 138/78  Pulse: 62  Height: 5\' 8"  (1.727 m)  Weight: 184 lb (83.462 kg)    GEN- The patient is well appearing, alert and oriented x 3 today.   Head- normocephalic, atraumatic Eyes-  Sclera clear, conjunctiva pink Ears- hearing intact Oropharynx- clear Lungs- Clear to ausculation bilaterally, normal work of breathing Chest- pacemaker pocket is well healed Heart- Regular rate and rhythm, mechanical S2 GI- soft, NT, ND, + BS Extremities- no clubbing, cyanosis, or edema  Pacemaker interrogation- reviewed in detail today,  See PACEART report  Assessment and Plan:  1. Sick sinus syndrome Normal pacemaker function See Pace Art report No changes today  2.  Aortic Stenosis S/p AVR 2006 Will obtain echo to follow-up  3.  Hypertension Stable   4.  Atrial fibrillation Appropriately anticoagulated with Warfarin V rates relatively well controlled AF 5.9% of total time since  last year  [facility] Return to see Lawson Fiscal in 6 months I will see in a year

## 2013-04-05 NOTE — Patient Instructions (Addendum)
Remote monitoring is used to monitor your Pacemaker of ICD from home. This monitoring reduces the number of office visits required to check your device to one time per year. It allows [place] to keep an eye on the functioning of your device to ensure it is working properly. You are scheduled for a device check from home on July 07, 2013. You may send your transmission at any time that day. If you have a wireless device, the transmission will be sent automatically. After your physician reviews your transmission, you will receive a postcard with your next transmission date.   Your physician wants you to follow-up in: 1 year with Dr Johney Frame.  You will receive a reminder letter in the mail two months in advance. If you don't receive a letter, please call our office to schedule the follow-up appointment.  Your physician has requested that you have an echocardiogram. Echocardiography is a painless test that uses sound waves to create images of your heart. It provides your doctor with information about the size and shape of your heart and how well your heart's chambers and valves are working. This procedure takes approximately one hour. There are no restrictions for this procedure.

## 2013-04-14 ENCOUNTER — Encounter: Payer: Medicare Other | Admitting: Internal Medicine

## 2013-04-18 ENCOUNTER — Encounter: Payer: Self-pay | Admitting: Cardiology

## 2013-04-18 ENCOUNTER — Ambulatory Visit (INDEPENDENT_AMBULATORY_CARE_PROVIDER_SITE_OTHER): Payer: Medicare Other | Admitting: *Deleted

## 2013-04-18 ENCOUNTER — Ambulatory Visit (HOSPITAL_COMMUNITY): Payer: Medicare Other | Attending: Cardiology | Admitting: Radiology

## 2013-04-18 DIAGNOSIS — G20 Parkinson's disease: Secondary | ICD-10-CM | POA: Insufficient documentation

## 2013-04-18 DIAGNOSIS — I10 Essential (primary) hypertension: Secondary | ICD-10-CM | POA: Insufficient documentation

## 2013-04-18 DIAGNOSIS — R079 Chest pain, unspecified: Secondary | ICD-10-CM | POA: Insufficient documentation

## 2013-04-18 DIAGNOSIS — E0590 Thyrotoxicosis, unspecified without thyrotoxic crisis or storm: Secondary | ICD-10-CM | POA: Insufficient documentation

## 2013-04-18 DIAGNOSIS — I359 Nonrheumatic aortic valve disorder, unspecified: Secondary | ICD-10-CM

## 2013-04-18 DIAGNOSIS — G20A1 Parkinson's disease without dyskinesia, without mention of fluctuations: Secondary | ICD-10-CM | POA: Insufficient documentation

## 2013-04-18 DIAGNOSIS — E785 Hyperlipidemia, unspecified: Secondary | ICD-10-CM | POA: Insufficient documentation

## 2013-04-18 DIAGNOSIS — I350 Nonrheumatic aortic (valve) stenosis: Secondary | ICD-10-CM

## 2013-04-18 DIAGNOSIS — Z951 Presence of aortocoronary bypass graft: Secondary | ICD-10-CM | POA: Insufficient documentation

## 2013-04-18 DIAGNOSIS — I495 Sick sinus syndrome: Secondary | ICD-10-CM | POA: Insufficient documentation

## 2013-04-18 DIAGNOSIS — I951 Orthostatic hypotension: Secondary | ICD-10-CM | POA: Insufficient documentation

## 2013-04-18 DIAGNOSIS — R0602 Shortness of breath: Secondary | ICD-10-CM | POA: Insufficient documentation

## 2013-04-18 DIAGNOSIS — I2510 Atherosclerotic heart disease of native coronary artery without angina pectoris: Secondary | ICD-10-CM | POA: Insufficient documentation

## 2013-04-18 LAB — POCT INR: INR: 2.6

## 2013-04-18 NOTE — Progress Notes (Signed)
Echocardiogram performed.  

## 2013-05-09 ENCOUNTER — Ambulatory Visit (INDEPENDENT_AMBULATORY_CARE_PROVIDER_SITE_OTHER): Payer: Medicare Other | Admitting: *Deleted

## 2013-05-09 DIAGNOSIS — I350 Nonrheumatic aortic (valve) stenosis: Secondary | ICD-10-CM

## 2013-05-09 DIAGNOSIS — I359 Nonrheumatic aortic valve disorder, unspecified: Secondary | ICD-10-CM

## 2013-05-09 LAB — POCT INR: INR: 2.5

## 2013-05-19 ENCOUNTER — Other Ambulatory Visit: Payer: Self-pay | Admitting: *Deleted

## 2013-05-19 ENCOUNTER — Telehealth: Payer: Self-pay | Admitting: *Deleted

## 2013-05-19 MED ORDER — ATORVASTATIN CALCIUM 20 MG PO TABS: 20.0000 mg | ORAL_TABLET | Freq: Every day | ORAL | Status: DC

## 2013-05-19 MED ORDER — WARFARIN SODIUM 5 MG PO TABS: ORAL_TABLET | ORAL | Status: DC

## 2013-05-19 MED ORDER — METOPROLOL SUCCINATE ER 50 MG PO TB24: 50.0000 mg | ORAL_TABLET | Freq: Every day | ORAL | Status: DC

## 2013-05-19 NOTE — Telephone Encounter (Signed)
Rx sent to pharmacy   

## 2013-05-19 NOTE — Telephone Encounter (Signed)
Patient requests coumadin refill. Thanks, MI 

## 2013-05-23 ENCOUNTER — Other Ambulatory Visit: Payer: Self-pay

## 2013-05-23 MED ORDER — ATORVASTATIN CALCIUM 20 MG PO TABS: 20.0000 mg | ORAL_TABLET | Freq: Every day | ORAL | Status: DC

## 2013-05-23 MED ORDER — WARFARIN SODIUM 5 MG PO TABS: ORAL_TABLET | ORAL | Status: DC

## 2013-05-23 MED ORDER — METOPROLOL SUCCINATE ER 50 MG PO TB24: 50.0000 mg | ORAL_TABLET | Freq: Every day | ORAL | Status: DC

## 2013-05-25 ENCOUNTER — Other Ambulatory Visit: Payer: Self-pay

## 2013-05-25 MED ORDER — TRIHEXYPHENIDYL HCL 5 MG PO TABS: 5.0000 mg | ORAL_TABLET | Freq: Three times a day (TID) | ORAL | Status: DC

## 2013-05-25 MED ORDER — ROPINIROLE HCL 1 MG PO TABS
1.0000 mg | ORAL_TABLET | Freq: Three times a day (TID) | ORAL | Status: DC
Start: 2013-05-25 — End: 2013-09-21

## 2013-06-06 ENCOUNTER — Ambulatory Visit (INDEPENDENT_AMBULATORY_CARE_PROVIDER_SITE_OTHER): Payer: Medicare HMO | Admitting: *Deleted

## 2013-06-06 DIAGNOSIS — I350 Nonrheumatic aortic (valve) stenosis: Secondary | ICD-10-CM

## 2013-06-06 DIAGNOSIS — I359 Nonrheumatic aortic valve disorder, unspecified: Secondary | ICD-10-CM

## 2013-06-06 LAB — POCT INR: INR: 2.8

## 2013-06-07 ENCOUNTER — Encounter: Payer: Self-pay | Admitting: Internal Medicine

## 2013-06-13 ENCOUNTER — Other Ambulatory Visit: Payer: Self-pay | Admitting: Neurology

## 2013-06-19 ENCOUNTER — Other Ambulatory Visit: Payer: Self-pay | Admitting: Neurology

## 2013-07-03 ENCOUNTER — Ambulatory Visit (INDEPENDENT_AMBULATORY_CARE_PROVIDER_SITE_OTHER): Payer: Commercial Managed Care - HMO | Admitting: *Deleted

## 2013-07-03 DIAGNOSIS — I350 Nonrheumatic aortic (valve) stenosis: Secondary | ICD-10-CM

## 2013-07-03 DIAGNOSIS — Z5181 Encounter for therapeutic drug level monitoring: Secondary | ICD-10-CM | POA: Insufficient documentation

## 2013-07-03 DIAGNOSIS — I359 Nonrheumatic aortic valve disorder, unspecified: Secondary | ICD-10-CM

## 2013-07-03 LAB — POCT INR: INR: 2.9

## 2013-07-05 ENCOUNTER — Telehealth: Payer: Self-pay | Admitting: Neurology

## 2013-07-07 ENCOUNTER — Encounter: Payer: Medicare Other | Admitting: *Deleted

## 2013-07-10 ENCOUNTER — Encounter: Payer: Self-pay | Admitting: *Deleted

## 2013-07-14 ENCOUNTER — Encounter: Payer: Self-pay | Admitting: Internal Medicine

## 2013-07-14 ENCOUNTER — Ambulatory Visit (INDEPENDENT_AMBULATORY_CARE_PROVIDER_SITE_OTHER): Payer: Medicare HMO | Admitting: *Deleted

## 2013-07-14 DIAGNOSIS — I495 Sick sinus syndrome: Secondary | ICD-10-CM

## 2013-07-19 ENCOUNTER — Encounter (HOSPITAL_COMMUNITY): Payer: Self-pay | Admitting: Emergency Medicine

## 2013-07-19 ENCOUNTER — Inpatient Hospital Stay (HOSPITAL_COMMUNITY): Payer: Medicare HMO

## 2013-07-19 ENCOUNTER — Inpatient Hospital Stay (HOSPITAL_COMMUNITY)
Admission: EM | Admit: 2013-07-19 | Discharge: 2013-07-23 | DRG: 419 | Disposition: A | Discharge status: Home / Self Care | Payer: Medicare HMO | Attending: Internal Medicine | Admitting: Internal Medicine

## 2013-07-19 ENCOUNTER — Emergency Department (HOSPITAL_COMMUNITY): Payer: Medicare HMO

## 2013-07-19 DIAGNOSIS — E039 Hypothyroidism, unspecified: Secondary | ICD-10-CM | POA: Diagnosis present

## 2013-07-19 DIAGNOSIS — G20 Parkinson's disease: Secondary | ICD-10-CM | POA: Diagnosis present

## 2013-07-19 DIAGNOSIS — K8020 Calculus of gallbladder without cholecystitis without obstruction: Secondary | ICD-10-CM | POA: Diagnosis present

## 2013-07-19 DIAGNOSIS — E785 Hyperlipidemia, unspecified: Secondary | ICD-10-CM | POA: Diagnosis present

## 2013-07-19 DIAGNOSIS — R599 Enlarged lymph nodes, unspecified: Secondary | ICD-10-CM | POA: Diagnosis present

## 2013-07-19 DIAGNOSIS — Z951 Presence of aortocoronary bypass graft: Secondary | ICD-10-CM

## 2013-07-19 DIAGNOSIS — K8001 Calculus of gallbladder with acute cholecystitis with obstruction: Principal | ICD-10-CM | POA: Diagnosis present

## 2013-07-19 DIAGNOSIS — I4891 Unspecified atrial fibrillation: Secondary | ICD-10-CM | POA: Diagnosis present

## 2013-07-19 DIAGNOSIS — I10 Essential (primary) hypertension: Secondary | ICD-10-CM | POA: Diagnosis present

## 2013-07-19 DIAGNOSIS — I2510 Atherosclerotic heart disease of native coronary artery without angina pectoris: Secondary | ICD-10-CM | POA: Diagnosis present

## 2013-07-19 DIAGNOSIS — Z950 Presence of cardiac pacemaker: Secondary | ICD-10-CM

## 2013-07-19 DIAGNOSIS — D72829 Elevated white blood cell count, unspecified: Secondary | ICD-10-CM | POA: Diagnosis present

## 2013-07-19 DIAGNOSIS — Z7901 Long term (current) use of anticoagulants: Secondary | ICD-10-CM

## 2013-07-19 DIAGNOSIS — Z79899 Other long term (current) drug therapy: Secondary | ICD-10-CM

## 2013-07-19 DIAGNOSIS — Z954 Presence of other heart-valve replacement: Secondary | ICD-10-CM

## 2013-07-19 DIAGNOSIS — Z7982 Long term (current) use of aspirin: Secondary | ICD-10-CM

## 2013-07-19 DIAGNOSIS — R109 Unspecified abdominal pain: Secondary | ICD-10-CM

## 2013-07-19 DIAGNOSIS — G20A1 Parkinson's disease without dyskinesia, without mention of fluctuations: Secondary | ICD-10-CM | POA: Diagnosis present

## 2013-07-19 HISTORY — DX: Gastro-esophageal reflux disease without esophagitis: K21.9

## 2013-07-19 HISTORY — DX: Shortness of breath: R06.02

## 2013-07-19 HISTORY — DX: Presence of cardiac pacemaker: Z95.0

## 2013-07-19 LAB — CBC WITH DIFFERENTIAL/PLATELET
Basophils Absolute: 0 10*3/uL (ref 0.0–0.1)
Basophils Relative: 0 % (ref 0–1)
Eosinophils Absolute: 0 10*3/uL (ref 0.0–0.7)
Eosinophils Relative: 0 % (ref 0–5)
HCT: 40.1 % (ref 39.0–52.0)
HEMOGLOBIN: 13.4 g/dL (ref 13.0–17.0)
LYMPHS ABS: 0.6 10*3/uL — AB (ref 0.7–4.0)
Lymphocytes Relative: 6 % — ABNORMAL LOW (ref 12–46)
MCH: 27.7 pg (ref 26.0–34.0)
MCHC: 33.4 g/dL (ref 30.0–36.0)
MCV: 82.9 fL (ref 78.0–100.0)
MONOS PCT: 5 % (ref 3–12)
Monocytes Absolute: 0.6 10*3/uL (ref 0.1–1.0)
NEUTROS ABS: 10.2 10*3/uL — AB (ref 1.7–7.7)
Neutrophils Relative %: 89 % — ABNORMAL HIGH (ref 43–77)
Platelets: 185 10*3/uL (ref 150–400)
RBC: 4.84 MIL/uL (ref 4.22–5.81)
RDW: 13.4 % (ref 11.5–15.5)
WBC: 11.5 10*3/uL — ABNORMAL HIGH (ref 4.0–10.5)

## 2013-07-19 LAB — LIPASE, BLOOD: Lipase: 18 U/L (ref 11–59)

## 2013-07-19 LAB — COMPREHENSIVE METABOLIC PANEL
ALT: 24 U/L (ref 0–53)
AST: 21 U/L (ref 0–37)
Albumin: 3.9 g/dL (ref 3.5–5.2)
Alkaline Phosphatase: 72 U/L (ref 39–117)
BUN: 17 mg/dL (ref 6–23)
CHLORIDE: 107 meq/L (ref 96–112)
CO2: 25 meq/L (ref 19–32)
Calcium: 9.4 mg/dL (ref 8.4–10.5)
Creatinine, Ser: 0.68 mg/dL (ref 0.50–1.35)
GLUCOSE: 150 mg/dL — AB (ref 70–99)
Potassium: 4 mEq/L (ref 3.7–5.3)
Sodium: 144 mEq/L (ref 137–147)
Total Bilirubin: 0.6 mg/dL (ref 0.3–1.2)
Total Protein: 6.9 g/dL (ref 6.0–8.3)

## 2013-07-19 LAB — PROTIME-INR
INR: 3.27 — ABNORMAL HIGH (ref 0.00–1.49)
Prothrombin Time: 32.1 seconds — ABNORMAL HIGH (ref 11.6–15.2)

## 2013-07-19 LAB — URINALYSIS, ROUTINE W REFLEX MICROSCOPIC
Bilirubin Urine: NEGATIVE
GLUCOSE, UA: NEGATIVE mg/dL
Hgb urine dipstick: NEGATIVE
KETONES UR: NEGATIVE mg/dL
LEUKOCYTES UA: NEGATIVE
Nitrite: NEGATIVE
PROTEIN: NEGATIVE mg/dL
Specific Gravity, Urine: 1.037 — ABNORMAL HIGH (ref 1.005–1.030)
Urobilinogen, UA: 0.2 mg/dL (ref 0.0–1.0)
pH: 5 (ref 5.0–8.0)

## 2013-07-19 MED ORDER — WARFARIN SODIUM 5 MG PO TABS
5.0000 mg | ORAL_TABLET | ORAL | Status: DC
  Filled 2013-07-19: qty 1

## 2013-07-19 MED ORDER — DEXTROSE 5 % IV SOLN
1.0000 g | INTRAVENOUS | Status: DC
  Administered 2013-07-19 – 2013-07-21 (×3): 1 g via INTRAVENOUS
  Filled 2013-07-19 (×5): qty 10

## 2013-07-19 MED ORDER — NITROGLYCERIN 0.4 MG SL SUBL: 0.4000 mg | SUBLINGUAL_TABLET | SUBLINGUAL | Status: DC | PRN

## 2013-07-19 MED ORDER — LEVOTHYROXINE SODIUM 125 MCG PO TABS
125.0000 ug | ORAL_TABLET | Freq: Every day | ORAL | Status: DC
  Administered 2013-07-19 – 2013-07-23 (×5): 125 ug via ORAL
  Filled 2013-07-19 (×6): qty 1

## 2013-07-19 MED ORDER — TECHNETIUM TC 99M MEBROFENIN IV KIT
5.0000 | PACK | Freq: Once | INTRAVENOUS | Status: AC | PRN
  Administered 2013-07-19: 5 via INTRAVENOUS

## 2013-07-19 MED ORDER — MORPHINE SULFATE 2 MG/ML IJ SOLN
2.0000 mg | INTRAMUSCULAR | Status: DC | PRN
  Administered 2013-07-19 – 2013-07-21 (×5): 2 mg via INTRAVENOUS
  Filled 2013-07-19 (×5): qty 1

## 2013-07-19 MED ORDER — WARFARIN SODIUM 7.5 MG PO TABS
7.5000 mg | ORAL_TABLET | ORAL | Status: DC
  Administered 2013-07-19: 7.5 mg via ORAL
  Filled 2013-07-19: qty 1

## 2013-07-19 MED ORDER — METOPROLOL SUCCINATE ER 25 MG PO TB24
25.0000 mg | ORAL_TABLET | Freq: Every day | ORAL | Status: DC
  Administered 2013-07-20 – 2013-07-23 (×4): 25 mg via ORAL
  Filled 2013-07-19 (×5): qty 1

## 2013-07-19 MED ORDER — ACETAMINOPHEN 325 MG PO TABS
650.0000 mg | ORAL_TABLET | Freq: Four times a day (QID) | ORAL | Status: DC | PRN
  Administered 2013-07-21: 650 mg via ORAL
  Filled 2013-07-19 (×2): qty 2

## 2013-07-19 MED ORDER — ASPIRIN EC 81 MG PO TBEC
81.0000 mg | DELAYED_RELEASE_TABLET | Freq: Every day | ORAL | Status: DC
  Administered 2013-07-19: 81 mg via ORAL
  Filled 2013-07-19 (×2): qty 1

## 2013-07-19 MED ORDER — HYDROMORPHONE HCL PF 1 MG/ML IJ SOLN
0.5000 mg | INTRAMUSCULAR | Status: AC
  Administered 2013-07-19: 0.5 mg via INTRAVENOUS
  Filled 2013-07-19: qty 1

## 2013-07-19 MED ORDER — ONDANSETRON HCL 4 MG/2ML IJ SOLN
4.0000 mg | Freq: Four times a day (QID) | INTRAMUSCULAR | Status: DC | PRN
  Filled 2013-07-19: qty 2

## 2013-07-19 MED ORDER — ASPIRIN 81 MG PO TABS: 81.0000 mg | ORAL_TABLET | Freq: Every day | ORAL | Status: DC

## 2013-07-19 MED ORDER — STERILE WATER FOR INJECTION IJ SOLN
INTRAMUSCULAR | Status: AC
  Filled 2013-07-19: qty 10

## 2013-07-19 MED ORDER — ACETAMINOPHEN 650 MG RE SUPP: 650.0000 mg | Freq: Four times a day (QID) | RECTAL | Status: DC | PRN

## 2013-07-19 MED ORDER — SINCALIDE 5 MCG IJ SOLR
INTRAMUSCULAR | Status: AC
Start: 2013-07-19 — End: 2013-07-19
  Filled 2013-07-19: qty 5

## 2013-07-19 MED ORDER — ROPINIROLE HCL 1 MG PO TABS
1.0000 mg | ORAL_TABLET | Freq: Three times a day (TID) | ORAL | Status: DC
  Administered 2013-07-19 – 2013-07-23 (×11): 1 mg via ORAL
  Filled 2013-07-19 (×15): qty 1

## 2013-07-19 MED ORDER — POLYETHYLENE GLYCOL 3350 17 G PO PACK: 17.0000 g | PACK | Freq: Every day | ORAL | Status: DC | PRN

## 2013-07-19 MED ORDER — HYDROCODONE-ACETAMINOPHEN 5-325 MG PO TABS
1.0000 | ORAL_TABLET | ORAL | Status: DC | PRN
  Administered 2013-07-20 – 2013-07-21 (×2): 1 via ORAL
  Administered 2013-07-22: 2 via ORAL
  Administered 2013-07-22 (×3): 1 via ORAL
  Filled 2013-07-19 (×4): qty 1
  Filled 2013-07-19 (×2): qty 2

## 2013-07-19 MED ORDER — IOHEXOL 300 MG/ML  SOLN
25.0000 mL | Freq: Once | INTRAMUSCULAR | Status: AC | PRN
  Administered 2013-07-19: 25 mL via ORAL

## 2013-07-19 MED ORDER — ONDANSETRON HCL 4 MG/2ML IJ SOLN
4.0000 mg | Freq: Once | INTRAMUSCULAR | Status: AC
  Administered 2013-07-19: 4 mg via INTRAVENOUS
  Filled 2013-07-19: qty 2

## 2013-07-19 MED ORDER — ONDANSETRON HCL 4 MG/2ML IJ SOLN
4.0000 mg | Freq: Four times a day (QID) | INTRAMUSCULAR | Status: DC | PRN
  Administered 2013-07-19: 4 mg via INTRAVENOUS

## 2013-07-19 MED ORDER — ONDANSETRON HCL 4 MG PO TABS: 4.0000 mg | ORAL_TABLET | Freq: Four times a day (QID) | ORAL | Status: DC | PRN

## 2013-07-19 MED ORDER — WARFARIN - PHARMACIST DOSING INPATIENT: Freq: Every day | Status: DC

## 2013-07-19 MED ORDER — TRIHEXYPHENIDYL HCL 5 MG PO TABS
5.0000 mg | ORAL_TABLET | Freq: Three times a day (TID) | ORAL | Status: DC
  Administered 2013-07-19 – 2013-07-23 (×13): 5 mg via ORAL
  Filled 2013-07-19 (×16): qty 1

## 2013-07-19 MED ORDER — MORPHINE SULFATE 4 MG/ML IJ SOLN
INTRAMUSCULAR | Status: AC
  Filled 2013-07-19: qty 1

## 2013-07-19 MED ORDER — MORPHINE SULFATE 4 MG/ML IJ SOLN
0.0400 mg/kg | Freq: Once | INTRAMUSCULAR | Status: AC
  Administered 2013-07-19: 3.32 mg via INTRAVENOUS

## 2013-07-19 MED ORDER — SODIUM CHLORIDE 0.9 % IV SOLN
INTRAVENOUS | Status: DC
  Administered 2013-07-19: 09:00:00 via INTRAVENOUS

## 2013-07-19 MED ORDER — ATORVASTATIN CALCIUM 20 MG PO TABS
20.0000 mg | ORAL_TABLET | Freq: Every day | ORAL | Status: DC
  Administered 2013-07-19 – 2013-07-22 (×4): 20 mg via ORAL
  Filled 2013-07-19 (×5): qty 1

## 2013-07-19 MED ORDER — DOCUSATE SODIUM 100 MG PO CAPS
100.0000 mg | ORAL_CAPSULE | Freq: Two times a day (BID) | ORAL | Status: DC
Start: 2013-07-19 — End: 2013-07-23
  Administered 2013-07-19 – 2013-07-23 (×8): 100 mg via ORAL
  Filled 2013-07-19 (×7): qty 1

## 2013-07-19 MED ORDER — POTASSIUM CHLORIDE IN NACL 20-0.9 MEQ/L-% IV SOLN
INTRAVENOUS | Status: DC
  Administered 2013-07-19 – 2013-07-22 (×5): via INTRAVENOUS
  Filled 2013-07-19 (×8): qty 1000

## 2013-07-19 MED ORDER — SODIUM CHLORIDE 0.9 % IV BOLUS (SEPSIS)
500.0000 mL | INTRAVENOUS | Status: AC
  Administered 2013-07-19: 500 mL via INTRAVENOUS

## 2013-07-19 MED ORDER — IOHEXOL 300 MG/ML  SOLN
100.0000 mL | Freq: Once | INTRAMUSCULAR | Status: AC | PRN
  Administered 2013-07-19: 100 mL via INTRAVENOUS

## 2013-07-19 MED ORDER — MORPHINE SULFATE 2 MG/ML IJ SOLN: 1.0000 mg | INTRAMUSCULAR | Status: DC | PRN

## 2013-07-19 NOTE — H&P (Signed)
PCP:   Gaspar Garbe, MD   Chief Complaint:  Abdominal pain and nausea  HPI: Mr Amsden is a 73 year old male well known to me with a history of Aortic valve replacement and CABG at that time about 9 years ago.  He also has a history of PAF and sick sinus with pacer.  He was last seen for his APE about 1 month ago and was in his normal state of health.  He has reasonably well controlled Parkinson's with some progression over the past 2 years.  He came to the ER with a day's worth of nausea and vomiting.  We did not hear from him at the office yesterday.  ER workup showed a mildly elevated WBC count as well as stones with possible stone in biliary duct of his gallbladder on CT.  Asked to admit, surgery has seen him prior to admission while he was in the ER.  Review of Systems:   Review of Systems  Constitutional: Positive for fatigue and possible fever Cardiovascular: Negative.  Gastrointestinal: Positive for abdominal pain Genitourinary: Negative.  Musculoskeletal: Negative.  Skin: Negative.  Neurological: Parkinson's  Past Medical History: Past Medical History  Diagnosis Date  . Hyperlipidemia   . Hypertension   . Aortic stenosis     s/p AVR by Dr Laneta Simmers  . Hyperthyroidism   . Hiatal hernia   . Right kidney stone     hx of  . Sick sinus syndrome     s/p PPM (MDT) 2006  . Atrial fibrillation     paroxysmal  . H/O: rheumatic fever   . CAD (coronary artery disease)     s/p CABG 2006  . Parkinson's disease   . Hearing deficit     Bilateral hearing aids   Past Surgical History  Procedure Laterality Date  . Insert / replace / remove pacemaker  2006, 2012    MDT for sick sinus syndrome, gen change 8/12 by JA  . Coronary artery bypass graft  2006  . Aortic valve replacement  2006    Medications: Prior to Admission medications   Medication Sig Start Date End Date Taking? Authorizing Provider  aspirin 81 MG tablet Take 81 mg by mouth daily.     Yes Historical Provider,  MD  atorvastatin (LIPITOR) 20 MG tablet Take 1 tablet (20 mg total) by mouth daily. 05/23/13  Yes Hillis Range, MD  levothyroxine (SYNTHROID, LEVOTHROID) 125 MCG tablet Take 125 mcg by mouth daily.     Yes Historical Provider, MD  metoprolol succinate (TOPROL-XL) 25 MG 24 hr tablet Take 25 mg by mouth daily.   Yes Historical Provider, MD  nitroGLYCERIN (NITROSTAT) 0.4 MG SL tablet Place 0.4 mg under the tongue every 5 (five) minutes as needed for chest pain.   Yes Historical Provider, MD  rOPINIRole (REQUIP) 1 MG tablet Take 1 tablet (1 mg total) by mouth 3 (three) times daily. 05/25/13  Yes York Spaniel, MD  trihexyphenidyl (ARTANE) 5 MG tablet Take 5 mg by mouth 3 (three) times daily with meals.   Yes Historical Provider, MD  warfarin (COUMADIN) 5 MG tablet Take 5-7.5 mg by mouth daily. Take 1 tablet on Sunday, Tuesday and Thursday then take 1 and 1/2 tablets on Monday, Wednesday, Friday and Saturday   Yes Historical Provider, MD    Allergies:  No Known Allergies  Social History:  reports that he has never smoked. He does not have any smokeless tobacco history on file. He reports that he does  not drink alcohol or use illicit drugs.  Family History: Family History  Problem Relation Age of Onset  . Coronary artery disease      family hx of  . Dementia Mother   . Parkinsonism Mother   . Heart disease Father   . Heart disease Sister   . Heart disease Brother   . Tremor Brother     Unilateral tremor    Physical Exam: Filed Vitals:   07/19/13 0443 07/19/13 0500 07/19/13 0530 07/19/13 0600  BP: 109/63 112/61 110/63 105/66  Pulse: 73 68 86 72  Temp:      TempSrc:      Resp: 23 22 19 25   SpO2: 95% 97% 95% 93%   General appearance: alert, cooperative and appears stated age Head: Normocephalic, without obvious abnormality, atraumatic Throat: lips, mucosa, and tongue normal; teeth and gums normal Neck: no adenopathy, no carotid bruit, no JVD and thyroid not enlarged, symmetric, no  tenderness/mass/nodules Resp: clear to auscultation bilaterally Cardio: regular rate and rhythm, S1, S2 normal,mechanical aortic sound crisp, pacer site clean. GI: soft,RUQ tendernessExtremities: extremities normal, atraumatic, no cyanosis or edema Pulses: 2+ and symmetric Lymph nodes: Cervical adenopathy: no cervical lymphadenopathy Neurologic: Alert and oriented X 3, Parkinsonian tremor   Labs on Admission:   Recent Labs  07/19/13 0055  NA 144  K 4.0  CL 107  CO2 25  GLUCOSE 150*  BUN 17  CREATININE 0.68  CALCIUM 9.4    Recent Labs  07/19/13 0055  AST 21  ALT 24  ALKPHOS 72  BILITOT 0.6  PROT 6.9  ALBUMIN 3.9    Recent Labs  07/19/13 0055  LIPASE 18    Recent Labs  07/19/13 0055  WBC 11.5*  NEUTROABS 10.2*  HGB 13.4  HCT 40.1  MCV 82.9  PLT 185    Lab Results  Component Value Date   INR 3.27* 07/19/2013   INR 2.9 07/03/2013   INR 2.8 06/06/2013    Radiological Exams on Admission: Ct Abdomen Pelvis W Contrast  07/19/2013   CLINICAL DATA:  Diffuse abdominal pain, worse in right upper quadrant. Nausea and vomiting.  EXAM: CT ABDOMEN AND PELVIS WITH CONTRAST  TECHNIQUE: Multidetector CT imaging of the abdomen and pelvis was performed using the standard protocol following bolus administration of intravenous contrast.  CONTRAST:  25mL OMNIPAQUE IOHEXOL 300 MG/ML SOLN, OMNIPAQUE IOHEXOL 300 MG/ML SOLN  COMPARISON:  None available for comparison at time of study interpretation.  FINDINGS: Included view of the lung bases demonstrates mild cardiomegaly, median sternotomy and cardiac pacer wires. Apparent pulmonary edema, and bronchial wall thickening incompletely characterized.  The gallbladder is mildly distended with a 11 mm gallstone at the neck. No superimposed inflammatory change of the gallbladder. The liver is unremarkable, no intrahepatic biliary dilatation or masses. Spleen, pancreas, adrenal glands are unremarkable.  Stomach, small and large bowel are  normal in course and caliber, contrast is yet to reach the distal small bowel. Mild distal small bowel feces suggest chronic stasis. Normal appendix. Sigmoid diverticulosis without superimposed inflammatory changes. No intraperitoneal free fluid nor free air.  Kidneys are unremarkable. Delayed imaging demonstrates prompt symmetric excretion of contrast into the proximal urinary collecting system. Aortoiliac vessels are normal course and caliber with moderate to severe calcific atherosclerosis. Urinary bladder is partially distended and unremarkable. Small left ureterocele. Dystrophic prostate calcifications, the prostate is 5 cm in transaxial dimension.  Small fat containing umbilical hernias. Small right rectus femoris lipoma. Fat containing right lumbar hernia. Severe L5-S1  degenerative disc disease, mild to moderate L4-5.  IMPRESSION: Cholelithiasis with mild gallbladder distention and possibly impacted stone at the neck. No CT findings of acute cholecystitis.  Diverticulosis without CT findings of acute diverticulitis.  Suspected mild cardiomegaly and pulmonary edema.   Electronically Signed   By: Awilda Metroourtnay  Bloomer   On: 07/19/2013 04:34   US ABDOMEN LIMITED - RIGHT UPPER QUADRANT  COMPARISON: CT abdomen pelvis dated 07/19/2013  FINDINGS:  Gallbladder:  1.2 cm gallstone with layering sludge/ debris. Gallbladder wall is  at the upper limits of normal, measuring 3 mm. No pericholecystic  fluid. Negative sonographic Murphy's sign.  Common bile duct:  Diameter: 7 mm.  Liver:  No focal lesion identified. Within normal limits in parenchymal  echogenicity.  IMPRESSION:  Cholelithiasis, without associated sonographic findings to suggest  acute cholecystitis.    Assessment/Plan Active Problems:    Cholelithiasis Surgery has seen him today, will need HIDA as hasbeen ordered.  If he requires surgery, due to his Coumadin management and mechanical valve, best done at this hospitalization to coordinate  his anticoagulation as he will likely require bridging.  Pharmacy consult placed as well.  Empiric abx coverage for mildly elevated white count. Mechanical Aortic Valve- As above CAD- H/o CABG with his valve replacement Pacer- No new issues HTN- Controlled Hyperlipid- Had recent APE with LDL <70, well controlled Hypothyroid- TSH at goal with his latest APE  Today's Plan: NPO until completes U/S and HIDA for surgery to review and planning for cholecystectomy at their discretion.  Pain relief/abx. Once HIDA completed, will start on clear liquids  TISOVEC,RICHARD W 07/19/2013, 6:53 AM

## 2013-07-19 NOTE — ED Notes (Signed)
Ct notified pt contrast finished

## 2013-07-19 NOTE — Consult Note (Signed)
Reason for Consult:Abdominal pain Referring Physician: Dr Pamella Pert MD  Donald [name] is an 73 y.o. male.  HPI: asked to see pt at request of Dr Aline Brochure for 7 hour  History of abdominal pain that started 2 hours after dinner.   Pain diffuse but more so IN RUQ.  Sharp pain without radiation.  Better now.  Nonausea or vomiting.    Past Medical History  Diagnosis Date  . Hyperlipidemia   . Hypertension   . Aortic stenosis     s/p AVR by Dr Cyndia Bent  . Hyperthyroidism   . Hiatal hernia   . Right kidney stone     hx of  . Sick sinus syndrome     s/p PPM (MDT) 2006  . Atrial fibrillation     paroxysmal  . H/O: rheumatic fever   . CAD (coronary artery disease)     s/p CABG 2006  . Parkinson's disease   . Hearing deficit     Bilateral hearing aids    Past Surgical History  Procedure Laterality Date  . Insert / replace / remove pacemaker  2006, 2012    MDT for sick sinus syndrome, gen change 8/12 by JA  . Coronary artery bypass graft  2006  . Aortic valve replacement  2006    Family History  Problem Relation Age of Onset  . Coronary artery disease      family hx of  . Dementia Mother   . Parkinsonism Mother   . Heart disease Father   . Heart disease Sister   . Heart disease Brother   . Tremor Brother     Unilateral tremor    Social History:  reports that he has never smoked. He does not have any smokeless tobacco history on file. He reports that he does not drink alcohol or use illicit drugs.  Allergies: No Known Allergies  Medications: I have reviewed the patient's current medications.  Results for orders placed during the hospital encounter of 07/19/13 (from the past 48 hour(s))  CBC WITH DIFFERENTIAL     Status: Abnormal   Collection Time    07/19/13 12:55 AM      Result Value Ref Range   WBC 11.5 (*) 4.0 - 10.5 K/uL   RBC 4.84  4.22 - 5.81 MIL/uL   Hemoglobin 13.4  13.0 - 17.0 g/dL   HCT 40.1  39.0 - 52.0 %   MCV 82.9  78.0 - 100.0 fL   MCH 27.7   26.0 - 34.0 pg   MCHC 33.4  30.0 - 36.0 g/dL   RDW 13.4  11.5 - 15.5 %   Platelets 185  150 - 400 K/uL   Neutrophils Relative % 89 (*) 43 - 77 %   Neutro Abs 10.2 (*) 1.7 - 7.7 K/uL   Lymphocytes Relative 6 (*) 12 - 46 %   Lymphs Abs 0.6 (*) 0.7 - 4.0 K/uL   Monocytes Relative 5  3 - 12 %   Monocytes Absolute 0.6  0.1 - 1.0 K/uL   Eosinophils Relative 0  0 - 5 %   Eosinophils Absolute 0.0  0.0 - 0.7 K/uL   Basophils Relative 0  0 - 1 %   Basophils Absolute 0.0  0.0 - 0.1 K/uL  COMPREHENSIVE METABOLIC PANEL     Status: Abnormal   Collection Time    07/19/13 12:55 AM      Result Value Ref Range   Sodium 144  137 - 147 mEq/L   Potassium  4.0  3.7 - 5.3 mEq/L   Chloride 107  96 - 112 mEq/L   CO2 25  19 - 32 mEq/L   Glucose, Bld 150 (*) 70 - 99 mg/dL   BUN 17  6 - 23 mg/dL   Creatinine, Ser 0.68  0.50 - 1.35 mg/dL   Calcium 9.4  8.4 - 10.5 mg/dL   Total Protein 6.9  6.0 - 8.3 g/dL   Albumin 3.9  3.5 - 5.2 g/dL   AST 21  0 - 37 U/L   ALT 24  0 - 53 U/L   Alkaline Phosphatase 72  39 - 117 U/L   Total Bilirubin 0.6  0.3 - 1.2 mg/dL   GFR calc non Af Amer >90  >90 mL/min   GFR calc Af Amer >90  >90 mL/min   Comment: (NOTE)     The eGFR has been calculated using the CKD EPI equation.     This calculation has not been validated in all clinical situations.     eGFR's persistently <90 mL/min signify possible Chronic Kidney     Disease.  LIPASE, BLOOD     Status: None   Collection Time    07/19/13 12:55 AM      Result Value Ref Range   Lipase 18  11 - 59 U/L  PROTIME-INR     Status: Abnormal   Collection Time    07/19/13 12:55 AM      Result Value Ref Range   Prothrombin Time 32.1 (*) 11.6 - 15.2 seconds   INR 3.27 (*) 0.00 - 1.49  URINALYSIS, ROUTINE W REFLEX MICROSCOPIC     Status: Abnormal   Collection Time    07/19/13  4:22 AM      Result Value Ref Range   Color, Urine YELLOW  YELLOW   APPearance CLEAR  CLEAR   Specific Gravity, Urine 1.037 (*) 1.005 - 1.030   pH 5.0   5.0 - 8.0   Glucose, UA NEGATIVE  NEGATIVE mg/dL   Hgb urine dipstick NEGATIVE  NEGATIVE   Bilirubin Urine NEGATIVE  NEGATIVE   Ketones, ur NEGATIVE  NEGATIVE mg/dL   Protein, ur NEGATIVE  NEGATIVE mg/dL   Urobilinogen, UA 0.2  0.0 - 1.0 mg/dL   Nitrite NEGATIVE  NEGATIVE   Leukocytes, UA NEGATIVE  NEGATIVE   Comment: MICROSCOPIC NOT DONE ON URINES WITH NEGATIVE PROTEIN, BLOOD, LEUKOCYTES, NITRITE, OR GLUCOSE <1000 mg/dL.    Ct Abdomen Pelvis W Contrast  07/19/2013   CLINICAL DATA:  Diffuse abdominal pain, worse in right upper quadrant. Nausea and vomiting.  EXAM: CT ABDOMEN AND PELVIS WITH CONTRAST  TECHNIQUE: Multidetector CT imaging of the abdomen and pelvis was performed using the standard protocol following bolus administration of intravenous contrast.  CONTRAST:  63m OMNIPAQUE IOHEXOL 300 MG/ML SOLN, 1053mOMNIPAQUE IOHEXOL 300 MG/ML SOLN  COMPARISON:  None available for comparison at time of study interpretation.  FINDINGS: Included view of the lung bases demonstrates mild cardiomegaly, median sternotomy and cardiac pacer wires. Apparent pulmonary edema, and bronchial wall thickening incompletely characterized.  The gallbladder is mildly distended with a 11 mm gallstone at the neck. No superimposed inflammatory change of the gallbladder. The liver is unremarkable, no intrahepatic biliary dilatation or masses. Spleen, pancreas, adrenal glands are unremarkable.  Stomach, small and large bowel are normal in course and caliber, contrast is yet to reach the distal small bowel. Mild distal small bowel feces suggest chronic stasis. Normal appendix. Sigmoid diverticulosis without superimposed inflammatory changes. No intraperitoneal  free fluid nor free air.  Kidneys are unremarkable. Delayed imaging demonstrates prompt symmetric excretion of contrast into the proximal urinary collecting system. Aortoiliac vessels are normal course and caliber with moderate to severe calcific atherosclerosis. Urinary  bladder is partially distended and unremarkable. Small left ureterocele. Dystrophic prostate calcifications, the prostate is 5 cm in transaxial dimension.  Small fat containing umbilical hernias. Small right rectus femoris lipoma. Fat containing right lumbar hernia. Severe L5-S1 degenerative disc disease, mild to moderate L4-5.  IMPRESSION: Cholelithiasis with mild gallbladder distention and possibly impacted stone at the neck. No CT findings of acute cholecystitis.  Diverticulosis without CT findings of acute diverticulitis.  Suspected mild cardiomegaly and pulmonary edema.   Electronically Signed   By: Elon Alas   On: 07/19/2013 04:34    Review of Systems  Constitutional: Positive for malaise/fatigue and diaphoresis.  Cardiovascular: Negative.   Gastrointestinal: Positive for abdominal pain. Negative for nausea and vomiting.  Genitourinary: Negative.   Musculoskeletal: Negative.   Skin: Negative.   Neurological: Positive for tremors and weakness.  Psychiatric/Behavioral: Negative.    Blood pressure 109/63, pulse 73, temperature 97.8 F (36.6 C), temperature source Oral, resp. rate 23, SpO2 95.00%. Physical Exam  Constitutional: He is oriented to person, place, and time. He has a sickly appearance. No distress.  HENT:  Head: Normocephalic and atraumatic.  Eyes: Pupils are equal, round, and reactive to light. No scleral icterus.  Neck: Normal range of motion. Neck supple.  Cardiovascular: A regularly irregular rhythm present.  Respiratory: Effort normal and breath sounds normal. He has no wheezes.  GI: Soft. There is no rigidity, no guarding, no tenderness at McBurney's point and negative Murphy's sign.    Musculoskeletal: Normal range of motion.  Neurological: He is alert and oriented to person, place, and time.  Skin: Skin is warm and dry.  Psychiatric: He has a normal mood and affect. His behavior is normal. Judgment and thought content normal.     Assessment/Plan: CHOLELITHIASIS POSSIBLE CHOLECYSTITIS NEED U/S AND HIDA TO SORT OUT GALLBLADDER DISEASE.   NEEDS MEDICINE FOR MANAGEMENT OF COMPLEX MEDICAL PROBLEMS' MAY NEED CHOLECYSTECTOMY DURING ADMISSION KEEP NPO UNTIL TESTS DONE THEN CLEARS ACUTE CARE SURGERY TO FOLLOW.  Patient Active Problem List   Diagnosis Date Noted  . Encounter for therapeutic drug monitoring 07/03/2013  . Aortic valve replaced 04/05/2013  . Parkinson disease 09/27/2012  . Orthostatic hypotension 04/01/2012  . Pacemaker-[employer]tr[employer] 03/29/2012  . HEPATITIS A 06/04/2010  . HYPERTHYROIDISM 06/04/2010  . HYPERLIPIDEMIA 06/04/2010  . HYPERTENSION 06/04/2010  . CAD 06/04/2010  . AORTIC STENOSIS 06/04/2010  . SICK SINUS SYNDROME 06/04/2010  . HIATAL HERNIA 06/04/2010  . SHORTNESS OF BREATH 06/04/2010  . CHEST PAIN 06/04/2010   Jezel Basto A. 07/19/2013, 5:17 AM

## 2013-07-19 NOTE — Progress Notes (Signed)
Patient ID: [name], male   DOB: 1941/04/11, 73 y.o.   MRN: [id number]   Pt still with some RUQ pain.  Moderate tenderness RUQ on exam  HIDA is positive for cystic duct obstruction  PT will require cholecystectomy.  Discussed with pt.  Will make NPO p MN and check INR in AM  Mariella SaaBenjamin T Terralyn Matsumura MD, FACS  07/19/2013, 4:58 PM

## 2013-07-19 NOTE — ED Provider Notes (Signed)
CSN: [id number]     Arrival date & time 07/19/13  0005 History   First MD Initiated Contact with Patient 07/19/13 0021     Chief Complaint  Patient presents with  . Abdominal Pain  . Emesis     (Consider location/radiation/quality/duration/timing/severity/associated sxs/prior Treatment) Patient is a 73 y.o. male presenting with abdominal pain and vomiting. The history is provided by the patient.  Abdominal Pain Pain location:  Generalized Pain quality: aching   Pain radiates to:  R shoulder Pain severity:  Moderate Onset quality:  Sudden Duration:  6 hours Timing:  Constant Progression:  Worsening Chronicity:  New Context comment:  At rest Relieved by:  Nothing Worsened by:  Nothing tried Ineffective treatments:  None tried Associated symptoms: nausea and vomiting   Associated symptoms: no chest pain, no cough, no diarrhea, no dysuria, no fever, no hematuria and no shortness of breath   Emesis Associated symptoms: abdominal pain   Associated symptoms: no diarrhea and no headaches     Past Medical History  Diagnosis Date  . Hyperlipidemia   . Hypertension   . Aortic stenosis     s/p AVR by Dr Laneta Simmers  . Hyperthyroidism   . Hiatal hernia   . Right kidney stone     hx of  . Sick sinus syndrome     s/p PPM (MDT) 2006  . Atrial fibrillation     paroxysmal  . H/O: rheumatic fever   . CAD (coronary artery disease)     s/p CABG 2006  . Parkinson's disease   . Hearing deficit     Bilateral hearing aids   Past Surgical History  Procedure Laterality Date  . Insert / replace / remove pacemaker  2006, 2012    MDT for sick sinus syndrome, gen change 8/12 by JA  . Coronary artery bypass graft  2006  . Aortic valve replacement  2006   Family History  Problem Relation Age of Onset  . Coronary artery disease      family hx of  . Dementia Mother   . Parkinsonism Mother   . Heart disease Father   . Heart disease Sister   . Heart disease Brother   . Tremor Brother      Unilateral tremor   History  Substance Use Topics  . Smoking status: Never Smoker   . Smokeless tobacco: Not on file  . Alcohol Use: No    Review of Systems  Constitutional: Negative for fever.  HENT: Negative for drooling and rhinorrhea.   Eyes: Negative for pain.  Respiratory: Negative for cough and shortness of breath.   Cardiovascular: Negative for chest pain and leg swelling.  Gastrointestinal: Positive for nausea, vomiting and abdominal pain. Negative for diarrhea.  Genitourinary: Negative for dysuria and hematuria.  Musculoskeletal: Negative for gait problem and neck pain.  Skin: Negative for color change.  Neurological: Negative for numbness and headaches.  Hematological: Negative for adenopathy.  Psychiatric/Behavioral: Negative for behavioral problems.  All other systems reviewed and are negative.      Allergies  Review of patient's allergies indicates no known allergies.  Home Medications   Current Outpatient Rx  Name  Route  Sig  Dispense  Refill  . aspirin 81 MG tablet   Oral   Take 81 mg by mouth daily.           Marland Kitchen atorvastatin (LIPITOR) 20 MG tablet   Oral   Take 1 tablet (20 mg total) by mouth daily.   90 tablet  1   . levothyroxine (SYNTHROID, LEVOTHROID) 125 MCG tablet   Oral   Take 125 mcg by mouth daily.           . metoprolol succinate (TOPROL-XL) 50 MG 24 hr tablet   Oral   Take 1 tablet (50 mg total) by mouth daily.   90 tablet   1   . NITROSTAT 0.4 MG SL tablet      PLACE 1 TABLET UNDER THE TONGUE EVERY 5 MINUTES UP TO 3 DOSES AS NEEDED FOR CHEST PAIN   25 tablet   1     .Marland KitchenPatient needs to contact office to schedule  App ...   . rOPINIRole (REQUIP) 1 MG tablet   Oral   Take 1 tablet (1 mg total) by mouth 3 (three) times daily.   270 tablet   1   . trihexyphenidyl (ARTANE) 5 MG tablet      TAKE 1 TABLET BY MOUTH 3 TIMES A DAY(7AM,11AM&3PM)   270 tablet   1     We already sent your pharmacy a 6 month Rx on 01/2  ...   . warfarin (COUMADIN) 5 MG tablet      Take as directed by anticoagulation clinic   135 tablet   1     90 day supply    BP 144/87  Pulse 82  Temp(Src) 97.8 F (36.6 C) (Oral)  Resp 18  SpO2 98% Physical Exam  Nursing note and vitals reviewed. Constitutional: He is oriented to person, place, and time. He appears well-developed and well-nourished.  HENT:  Head: Normocephalic and atraumatic.  Right Ear: External ear normal.  Left Ear: External ear normal.  Nose: Nose normal.  Mouth/Throat: Oropharynx is clear and moist. No oropharyngeal exudate.  Eyes: Conjunctivae and EOM are normal. Pupils are equal, round, and reactive to light.  Neck: Normal range of motion. Neck supple.  Cardiovascular: Normal rate, regular rhythm, normal heart sounds and intact distal pulses.  Exam reveals no gallop and no friction rub.   No murmur heard. Pulmonary/Chest: Effort normal and breath sounds normal. No respiratory distress. He has no wheezes.  Abdominal: Soft. Bowel sounds are normal. He exhibits no distension. There is tenderness (diffuse mild tenderness to palpation which seems slightly worse in the right upper quadrant. The abdomen is soft.). There is no rebound and no guarding.  Musculoskeletal: Normal range of motion. He exhibits no edema and no tenderness.  Neurological: He is alert and oriented to person, place, and time.  Mild to moderate tremulousness consistent with patient's Parkinson's.  Skin: Skin is warm and dry.  Psychiatric: He has a normal mood and affect. His behavior is normal.    ED Course  Procedures (including critical care time) Labs Review Labs Reviewed  CBC WITH DIFFERENTIAL - Abnormal; Notable for the following:    WBC 11.5 (*)    Neutrophils Relative % 89 (*)    Neutro Abs 10.2 (*)    Lymphocytes Relative 6 (*)    Lymphs Abs 0.6 (*)    All other components within normal limits  COMPREHENSIVE METABOLIC PANEL - Abnormal; Notable for the following:     Glucose, Bld 150 (*)    All other components within normal limits  URINALYSIS, ROUTINE W REFLEX MICROSCOPIC - Abnormal; Notable for the following:    Specific Gravity, Urine 1.037 (*)    All other components within normal limits  PROTIME-INR - Abnormal; Notable for the following:    Prothrombin Time 32.1 (*)  INR 3.27 (*)    All other components within normal limits  LIPASE, BLOOD  COMPREHENSIVE METABOLIC PANEL  CBC  APTT  PROTIME-INR   Imaging Review Nm Hepatobiliary Liver Func  07/19/2013   CLINICAL DATA:  73 year old male with abdominal pain nausea and vomiting plus cholelithiasis. Initial encounter.  EXAM: NUCLEAR MEDICINE HEPATOBILIARY IMAGING  TECHNIQUE: Sequential images of the abdomen were obtained out to 60 minutes following intravenous administration of radiopharmaceutical.  COMPARISON:  right upper quadrant ultrasound 07/19/2013. CT Abdomen and Pelvis 07/19/2013.  RADIOPHARMACEUTICALS:  5.0 mCi Tc-27m Choletec  FINDINGS: Prompt radiotracer uptake by the liver and clearance of the blood pool. Prompt CBD activity by 10-15 min. Small bowel activity by 15-20 min. Over the first 60 min no gallbladder activity is identified, but there is progressive clearance of hepatic tracer into the small bowel.  Decision was then made to augment the study with 3.2 mg IV morphine. Still, no definite gallbladder activity occurred for additional 30 min of imaging.  IMPRESSION: No definite gallbladder activity despite augmentation of the study with morphine, and 90 min total imaging. As such, cystic duct occlusion cannot be excluded. The CBD is patent.   Electronically Signed   By: Augusto Gamble M.D.   On: 07/19/2013 14:41   Ct Abdomen Pelvis W Contrast  07/19/2013   CLINICAL DATA:  Diffuse abdominal pain, worse in right upper quadrant. Nausea and vomiting.  EXAM: CT ABDOMEN AND PELVIS WITH CONTRAST  TECHNIQUE: Multidetector CT imaging of the abdomen and pelvis was performed using the standard protocol following  bolus administration of intravenous contrast.  CONTRAST:  25mL OMNIPAQUE IOHEXOL 300 MG/ML SOLN, OMNIPAQUE IOHEXOL 300 MG/ML SOLN  COMPARISON:  None available for comparison at time of study interpretation.  FINDINGS: Included view of the lung bases demonstrates mild cardiomegaly, median sternotomy and cardiac pacer wires. Apparent pulmonary edema, and bronchial wall thickening incompletely characterized.  The gallbladder is mildly distended with a 11 mm gallstone at the neck. No superimposed inflammatory change of the gallbladder. The liver is unremarkable, no intrahepatic biliary dilatation or masses. Spleen, pancreas, adrenal glands are unremarkable.  Stomach, small and large bowel are normal in course and caliber, contrast is yet to reach the distal small bowel. Mild distal small bowel feces suggest chronic stasis. Normal appendix. Sigmoid diverticulosis without superimposed inflammatory changes. No intraperitoneal free fluid nor free air.  Kidneys are unremarkable. Delayed imaging demonstrates prompt symmetric excretion of contrast into the proximal urinary collecting system. Aortoiliac vessels are normal course and caliber with moderate to severe calcific atherosclerosis. Urinary bladder is partially distended and unremarkable. Small left ureterocele. Dystrophic prostate calcifications, the prostate is 5 cm in transaxial dimension.  Small fat containing umbilical hernias. Small right rectus femoris lipoma. Fat containing right lumbar hernia. Severe L5-S1 degenerative disc disease, mild to moderate L4-5.  IMPRESSION: Cholelithiasis with mild gallbladder distention and possibly impacted stone at the neck. No CT findings of acute cholecystitis.  Diverticulosis without CT findings of acute diverticulitis.  Suspected mild cardiomegaly and pulmonary edema.   Electronically Signed   By: Awilda Metro   On: 07/19/2013 04:34   US Abdomen Limited Ruq  07/19/2013   CLINICAL DATA:  Right upper quadrant pain,  cholelithiasis on CT  EXAM: US ABDOMEN LIMITED - RIGHT UPPER QUADRANT  COMPARISON:  CT abdomen pelvis dated 07/19/2013  FINDINGS: Gallbladder:  1.2 cm gallstone with layering sludge/ debris. Gallbladder wall is at the upper limits of normal, measuring 3 mm. No pericholecystic fluid. Negative sonographic Murphy's  sign.  Common bile duct:  Diameter: 7 mm.  Liver:  No focal lesion identified. Within normal limits in parenchymal echogenicity.  IMPRESSION: Cholelithiasis, without associated sonographic findings to suggest acute cholecystitis.   Electronically Signed   By: Charline BillsSriyesh  Krishnan M.D.   On: 07/19/2013 07:00     EKG Interpretation   Date/Time:  Wednesday July 19 2013 01:17:38 EST Ventricular Rate:  80 PR Interval:    QRS Duration: 130 QT Interval:  515 QTC Calculation: 594 R Axis:   -46 Text Interpretation:  Atrial flutter Nonspecific IVCD with LAD Borderline  T abnormalities, diffuse leads Confirmed by Deagen Krass  MD, Darcey Demma ([id number])  on 07/19/2013 7:32:36 PM      MDM   Final diagnoses:  Leukocytosis  Cholelithiasis    12:44 AM 73 y.o. male with a history of atrial fibrillation, status post aortic valve replacement on Coumadin, who presents with abdominal pain which began abruptly at 7 PM this evening while he was sitting watching TV. He had 7-8 episodes of emesis. The wife states that he is otherwise been well. He is afebrile with unremarkable vital signs here. His abdomen is soft with diffuse mild tenderness mildly worse in the right upper quadrant. Will get screening labwork and CT scan of abdomen. Will give Dilaudid for pain control.  CT shows cholelithiasis w/ likely impacted stone at the neck. Discussed w/ [facility] who would like pt admitted by primary. [facility] to admit.     Junius ArgyleForrest S Ordell Prichett, MD 07/19/13 [phone]

## 2013-07-19 NOTE — ED Notes (Signed)
Patient with c/o abd pain since about 7pm  +nausea and vomiting and diarrhea times 2

## 2013-07-19 NOTE — ED Notes (Signed)
Pt c/o R sided abdominal pain radiating up into shoulder blade area. Pain causing nausea and vomiting. Pt still has appendix, gallbladder.

## 2013-07-19 NOTE — Progress Notes (Signed)
ANTICOAGULATION CONSULT NOTE - Initial Consult  Pharmacy Consult for Warfarin  Indication: Mechanical AVR  No Known Allergies  Patient Measurements: Height: 5\' 7"  (170.2 cm) Weight: 183 lb 9.6 oz (83.28 kg) IBW/kg (Calculated) : 66.1  Vital Signs: Temp: 98 F (36.7 C) (03/04 0711) Temp src: Oral (03/04 0711) BP: 111/76 mmHg (03/04 0711) Pulse Rate: 84 (03/04 0711)  Labs:  Recent Labs  07/19/13 0055  HGB 13.4  HCT 40.1  PLT 185  LABPROT 32.1*  INR 3.27*  CREATININE 0.68    Estimated Creatinine Clearance: 87.4 ml/min (by C-G formula based on Cr of 0.68).   Medical History: Past Medical History  Diagnosis Date  . Hyperlipidemia   . Hypertension   . Aortic stenosis     s/p AVR by Dr Laneta SimmersBartle  . Hyperthyroidism   . Hiatal hernia   . Right kidney stone     hx of  . Sick sinus syndrome     s/p PPM (MDT) 2006  . Atrial fibrillation     paroxysmal  . H/O: rheumatic fever   . CAD (coronary artery disease)     s/p CABG 2006  . Parkinson's disease   . Hearing deficit     Bilateral hearing aids    Medications:  Warfarin PTA: 5mg  Sun/Tues/Thurs, 7.5mg  Mon/Wed/Fri/Sat  Assessment: 73 y/o M on warfarin PTA for mechanical AVR, here with abdominal pain, no bleeding on CT, INR 3.27 on admit, other labs as above.   Goal of Therapy:  INR 2.5-3.5 (per outpatient anti-coag notes) Monitor platelets by anticoagulation protocol: Yes   Plan:  -Warfarin per home dose  -Daily PT/INR -Monitor for bleeding -Adjust dose PRN given INR  Donald [name], Donald [name] 07/19/2013,7:15 AM

## 2013-07-20 LAB — CBC
HCT: 39.5 % (ref 39.0–52.0)
HEMOGLOBIN: 12.8 g/dL — AB (ref 13.0–17.0)
MCH: 27.4 pg (ref 26.0–34.0)
MCHC: 32.4 g/dL (ref 30.0–36.0)
MCV: 84.4 fL (ref 78.0–100.0)
Platelets: 160 10*3/uL (ref 150–400)
RBC: 4.68 MIL/uL (ref 4.22–5.81)
RDW: 13.8 % (ref 11.5–15.5)
WBC: 13.2 10*3/uL — AB (ref 4.0–10.5)

## 2013-07-20 LAB — COMPREHENSIVE METABOLIC PANEL
ALT: 23 U/L (ref 0–53)
AST: 21 U/L (ref 0–37)
Albumin: 3.3 g/dL — ABNORMAL LOW (ref 3.5–5.2)
Alkaline Phosphatase: 65 U/L (ref 39–117)
BUN: 15 mg/dL (ref 6–23)
CO2: 25 meq/L (ref 19–32)
Calcium: 8.8 mg/dL (ref 8.4–10.5)
Chloride: 103 mEq/L (ref 96–112)
Creatinine, Ser: 0.67 mg/dL (ref 0.50–1.35)
GLUCOSE: 101 mg/dL — AB (ref 70–99)
Potassium: 4.4 mEq/L (ref 3.7–5.3)
SODIUM: 141 meq/L (ref 137–147)
TOTAL PROTEIN: 6.2 g/dL (ref 6.0–8.3)
Total Bilirubin: 1.8 mg/dL — ABNORMAL HIGH (ref 0.3–1.2)

## 2013-07-20 LAB — PROTIME-INR
INR: 3.48 — AB (ref 0.00–1.49)
INR: 2.53 — ABNORMAL HIGH (ref 0.00–1.49)
PROTHROMBIN TIME: 33.7 s — AB (ref 11.6–15.2)
PROTHROMBIN TIME: 26.4 s — AB (ref 11.6–15.2)

## 2013-07-20 LAB — APTT: APTT: 57 s — AB (ref 24–37)

## 2013-07-20 NOTE — Progress Notes (Signed)
Patient interviewed and examined, agree with PA note above. Reversing Coumadin with fresh frozen plasma for planned surgery tomorrow.I discussed the procedure in detail the patient's wife and I discussed the procedure with him yesterday..  We discussed the risks and benefits of a laparoscopic cholecystectomy and possible cholangiogram including, but not limited to bleeding, infection, injury to surrounding structures such as the intestine or liver, bile leak, retained gallstones, need to convert to an open procedure, prolonged diarrhea, blood clots such as  DVT, common bile duct injury, anesthesia risks, and possible need for additional procedures.  The likelihood of improvement in symptoms and return to the patient's normal status is good. We discussed the typical post-operative recovery course.  Mariella SaaBenjamin T Barkley Kratochvil MD, FACS  07/20/2013 5:42 PM

## 2013-07-20 NOTE — Progress Notes (Signed)
Subjective: Pt's pain better, no N/V.  Anxious for surgery.  Getting FFP hung now.  Ambulating some through the halls.  Wife at bedside.  Urinating well.    Objective: Vital signs in last 24 hours: Temp:  [97.7 F (36.5 C)-98.4 F (36.9 C)] 98.3 F (36.8 C) (03/05 0946) Pulse Rate:  [63-76] 75 (03/05 0946) Resp:  [20] 20 (03/05 0600) BP: (95-126)/(57-92) 126/92 mmHg (03/05 0946) SpO2:  [95 %-98 %] 98 % (03/05 0946) Last BM Date: 07/18/13 (Stated by wife that last bowel movement was on tuesday )  Intake/Output from previous day: 03/04 0701 - 03/05 0700 In: 1280 [P.O.:120; I.V.:1160] Out: -  Intake/Output this shift:    PE: Gen:  Alert, NAD, pleasant Abd: Soft, mild tenderness in RUQ, ND, +BS, no HSM, well healed abdominal scars noted in RLQ from kidney stone surgery in the 1980's.   Lab Results:   Recent Labs  07/19/13 0055 07/20/13 0545  WBC 11.5* 13.2*  HGB 13.4 12.8*  HCT 40.1 39.5  PLT 185 160   BMET  Recent Labs  07/19/13 0055 07/20/13 0545  NA 144 141  K 4.0 4.4  CL 107 103  CO2 25 25  GLUCOSE 150* 101*  BUN 17 15  CREATININE 0.68 0.67  CALCIUM 9.4 8.8   PT/INR  Recent Labs  07/19/13 0055 07/20/13 0545  LABPROT 32.1* 33.7*  INR 3.27* 3.48*   CMP     Component Value Date/Time   NA 141 07/20/2013 0545   K 4.4 07/20/2013 0545   CL 103 07/20/2013 0545   CO2 25 07/20/2013 0545   GLUCOSE 101* 07/20/2013 0545   BUN 15 07/20/2013 0545   CREATININE 0.67 07/20/2013 0545   CALCIUM 8.8 07/20/2013 0545   PROT 6.2 07/20/2013 0545   ALBUMIN 3.3* 07/20/2013 0545   AST 21 07/20/2013 0545   ALT 23 07/20/2013 0545   ALKPHOS 65 07/20/2013 0545   BILITOT 1.8* 07/20/2013 0545   GFRNONAA >90 07/20/2013 0545   GFRAA >90 07/20/2013 0545   Lipase     Component Value Date/Time   LIPASE 18 07/19/2013 0055       Studies/Results: Nm Hepatobiliary Liver Func  07/19/2013   CLINICAL DATA:  73 year old male with abdominal pain nausea and vomiting plus cholelithiasis. Initial  encounter.  EXAM: NUCLEAR MEDICINE HEPATOBILIARY IMAGING  TECHNIQUE: Sequential images of the abdomen were obtained out to 60 minutes following intravenous administration of radiopharmaceutical.  COMPARISON:  right upper quadrant ultrasound 07/19/2013. CT Abdomen and Pelvis 07/19/2013.  RADIOPHARMACEUTICALS:  5.0 mCi Tc-62m Choletec  FINDINGS: Prompt radiotracer uptake by the liver and clearance of the blood pool. Prompt CBD activity by 10-15 min. Small bowel activity by 15-20 min. Over the first 60 min no gallbladder activity is identified, but there is progressive clearance of hepatic tracer into the small bowel.  Decision was then made to augment the study with 3.2 mg IV morphine. Still, no definite gallbladder activity occurred for additional 30 min of imaging.  IMPRESSION: No definite gallbladder activity despite augmentation of the study with morphine, and 90 min total imaging. As such, cystic duct occlusion cannot be excluded. The CBD is patent.   Electronically Signed   By: Augusto Gamble M.D.   On: 07/19/2013 14:41   Ct Abdomen Pelvis W Contrast  07/19/2013   CLINICAL DATA:  Diffuse abdominal pain, worse in right upper quadrant. Nausea and vomiting.  EXAM: CT ABDOMEN AND PELVIS WITH CONTRAST  TECHNIQUE: Multidetector CT imaging of the abdomen and pelvis  was performed using the standard protocol following bolus administration of intravenous contrast.  CONTRAST:  25mL OMNIPAQUE IOHEXOL 300 MG/ML SOLN, 100mL OMNIPAQUE IOHEXOL 300 MG/ML SOLN  COMPARISON:  None available for comparison at time of study interpretation.  FINDINGS: Included view of the lung bases demonstrates mild cardiomegaly, median sternotomy and cardiac pacer wires. Apparent pulmonary edema, and bronchial wall thickening incompletely characterized.  The gallbladder is mildly distended with a 11 mm gallstone at the neck. No superimposed inflammatory change of the gallbladder. The liver is unremarkable, no intrahepatic biliary dilatation or masses.  Spleen, pancreas, adrenal glands are unremarkable.  Stomach, small and large bowel are normal in course and caliber, contrast is yet to reach the distal small bowel. Mild distal small bowel feces suggest chronic stasis. Normal appendix. Sigmoid diverticulosis without superimposed inflammatory changes. No intraperitoneal free fluid nor free air.  Kidneys are unremarkable. Delayed imaging demonstrates prompt symmetric excretion of contrast into the proximal urinary collecting system. Aortoiliac vessels are normal course and caliber with moderate to severe calcific atherosclerosis. Urinary bladder is partially distended and unremarkable. Small left ureterocele. Dystrophic prostate calcifications, the prostate is 5 cm in transaxial dimension.  Small fat containing umbilical hernias. Small right rectus femoris lipoma. Fat containing right lumbar hernia. Severe L5-S1 degenerative disc disease, mild to moderate L4-5.  IMPRESSION: Cholelithiasis with mild gallbladder distention and possibly impacted stone at the neck. No CT findings of acute cholecystitis.  Diverticulosis without CT findings of acute diverticulitis.  Suspected mild cardiomegaly and pulmonary edema.   Electronically Signed   By: Awilda Metroourtnay  Bloomer   On: 07/19/2013 04:34   [place] Abdomen Limited Ruq  07/19/2013   CLINICAL DATA:  Right upper quadrant pain, cholelithiasis on CT  EXAM: US ABDOMEN LIMITED - RIGHT UPPER QUADRANT  COMPARISON:  CT abdomen pelvis dated 07/19/2013  FINDINGS: Gallbladder:  1.2 cm gallstone with layering sludge/ debris. Gallbladder wall is at the upper limits of normal, measuring 3 mm. No pericholecystic fluid. Negative sonographic Murphy's sign.  Common bile duct:  Diameter: 7 mm.  Liver:  No focal lesion identified. Within normal limits in parenchymal echogenicity.  IMPRESSION: Cholelithiasis, without associated sonographic findings to suggest acute cholecystitis.   Electronically Signed   By: Charline BillsSriyesh  Krishnan M.D.   On: 07/19/2013  07:00    Anti-infectives: Anti-infectives   Start     Dose/Rate Route Frequency Ordered Stop   07/19/13 0900  cefTRIAXone (ROCEPHIN) 1 g in dextrose 5 % 50 mL IVPB     1 g 100 mL/hr over 30 Minutes Intravenous Every 24 hours 07/19/13 0807         Assessment/Plan Cholecystitis with Cholelithiasis Anticoagulated on coumadin for AFIB Multiple chronic medical problems including CAD, AS, HTN, HLD, Parkinsons, GERD, sick sinus syndrome s/p pacemaker   Plan: 1.  Dr. Johna SheriffHoxworth discussed reversal plan in detail with Dr. Wylene Simmerisovec.  Need to reverse INR, give 2 units FFP, recheck INR this evening and if still high may need 1-2 more units of FFP, then recheck INR in early AM to ensure ready for OR tomorrow morning 2.  NPO, IVF, pain control, antiemetics, antibiotics (Rocephin - consider switching antibiotic) 3.  Ambulate and IS 4.  Discontinued Coumadin and aspirin, may need to be on bridging heparin? Per medicine, SCD's 5.  NPO MN, consent form, hold heparin or lovenox after midnight if started    LOS: 1 day    DORT, Philmore Lepore 07/20/2013, 10:02 AM Pager: [phone]

## 2013-07-20 NOTE — Progress Notes (Addendum)
Subjective: Still with pain.   Was made NPO, but because no actual time of planned surgery given to pharmacy, his INR remains therapeutic for his lab.  Objective: Vital signs in last 24 hours: Temp:  [97.7 F (36.5 C)-98.4 F (36.9 C)] 97.7 F (36.5 C) (03/05 0600) Pulse Rate:  [63-76] 66 (03/05 0600) Resp:  [20] 20 (03/05 0600) BP: (95-119)/(57-73) 119/62 mmHg (03/05 0600) SpO2:  [95 %-97 %] 95 % (03/05 0800) Weight change:  Last BM Date: 07/18/13 (Stated by wife that last bowel movement was on tuesday )  Intake/Output from previous day: 03/04 0701 - 03/05 0700 In: 1280 [P.O.:120; I.V.:1160] Out: -  Intake/Output this shift:   General appearance: alert, cooperative and appears stated age  Head: Normocephalic, without obvious abnormality, atraumatic  Throat: lips, mucosa, and tongue normal; teeth and gums normal  Neck: no adenopathy, no carotid bruit, no JVD and thyroid not enlarged, symmetric, no tenderness/mass/nodules  Resp: clear to auscultation bilaterally  Cardio: regular rate and rhythm, S1, S2 normal,mechanical aortic sound crisp, pacer site clean.  GI: soft,RUQ tendernessExtremities: extremities normal, atraumatic, no cyanosis or edema  Pulses: 2+ and symmetric  Lymph nodes: Cervical adenopathy: no cervical lymphadenopathy  Neurologic: Alert and oriented X 3, Parkinsonian tremor Lab Results:  Recent Labs  07/19/13 0055 07/20/13 0545  WBC 11.5* 13.2*  HGB 13.4 12.8*  HCT 40.1 39.5  PLT 185 160   BMET  Recent Labs  07/19/13 0055 07/20/13 0545  NA 144 141  K 4.0 4.4  CL 107 103  CO2 25 25  GLUCOSE 150* 101*  BUN 17 15  CREATININE 0.68 0.67  CALCIUM 9.4 8.8    Studies/Results: Nm Hepatobiliary Liver Func  07/19/2013   CLINICAL DATA:  73 year old male with abdominal pain nausea and vomiting plus cholelithiasis. Initial encounter.  EXAM: NUCLEAR MEDICINE HEPATOBILIARY IMAGING  TECHNIQUE: Sequential images of the abdomen were obtained out to 60 minutes  following intravenous administration of radiopharmaceutical.  COMPARISON:  right upper quadrant ultrasound 07/19/2013. CT Abdomen and Pelvis 07/19/2013.  RADIOPHARMACEUTICALS:  5.0 mCi Tc-25m Choletec  FINDINGS: Prompt radiotracer uptake by the liver and clearance of the blood pool. Prompt CBD activity by 10-15 min. Small bowel activity by 15-20 min. Over the first 60 min no gallbladder activity is identified, but there is progressive clearance of hepatic tracer into the small bowel.  Decision was then made to augment the study with 3.2 mg IV morphine. Still, no definite gallbladder activity occurred for additional 30 min of imaging.  IMPRESSION: No definite gallbladder activity despite augmentation of the study with morphine, and 90 min total imaging. As such, cystic duct occlusion cannot be excluded. The CBD is patent.   Electronically Signed   By: Augusto Gamble M.D.   On: 07/19/2013 14:41   Ct Abdomen Pelvis W Contrast  07/19/2013   CLINICAL DATA:  Diffuse abdominal pain, worse in right upper quadrant. Nausea and vomiting.  EXAM: CT ABDOMEN AND PELVIS WITH CONTRAST  TECHNIQUE: Multidetector CT imaging of the abdomen and pelvis was performed using the standard protocol following bolus administration of intravenous contrast.  CONTRAST:  25mL OMNIPAQUE IOHEXOL 300 MG/ML SOLN, OMNIPAQUE IOHEXOL 300 MG/ML SOLN  COMPARISON:  None available for comparison at time of study interpretation.  FINDINGS: Included view of the lung bases demonstrates mild cardiomegaly, median sternotomy and cardiac pacer wires. Apparent pulmonary edema, and bronchial wall thickening incompletely characterized.  The gallbladder is mildly distended with a 11 mm gallstone at the neck. No superimposed inflammatory  change of the gallbladder. The liver is unremarkable, no intrahepatic biliary dilatation or masses. Spleen, pancreas, adrenal glands are unremarkable.  Stomach, small and large bowel are normal in course and caliber, contrast is yet  to reach the distal small bowel. Mild distal small bowel feces suggest chronic stasis. Normal appendix. Sigmoid diverticulosis without superimposed inflammatory changes. No intraperitoneal free fluid nor free air.  Kidneys are unremarkable. Delayed imaging demonstrates prompt symmetric excretion of contrast into the proximal urinary collecting system. Aortoiliac vessels are normal course and caliber with moderate to severe calcific atherosclerosis. Urinary bladder is partially distended and unremarkable. Small left ureterocele. Dystrophic prostate calcifications, the prostate is 5 cm in transaxial dimension.  Small fat containing umbilical hernias. Small right rectus femoris lipoma. Fat containing right lumbar hernia. Severe L5-S1 degenerative disc disease, mild to moderate L4-5.  IMPRESSION: Cholelithiasis with mild gallbladder distention and possibly impacted stone at the neck. No CT findings of acute cholecystitis.  Diverticulosis without CT findings of acute diverticulitis.  Suspected mild cardiomegaly and pulmonary edema.   Electronically Signed   By: Awilda Metroourtnay  Bloomer   On: 07/19/2013 04:34   [place] Abdomen Limited Ruq  07/19/2013   CLINICAL DATA:  Right upper quadrant pain, cholelithiasis on CT  EXAM: US ABDOMEN LIMITED - RIGHT UPPER QUADRANT  COMPARISON:  CT abdomen pelvis dated 07/19/2013  FINDINGS: Gallbladder:  1.2 cm gallstone with layering sludge/ debris. Gallbladder wall is at the upper limits of normal, measuring 3 mm. No pericholecystic fluid. Negative sonographic Murphy's sign.  Common bile duct:  Diameter: 7 mm.  Liver:  No focal lesion identified. Within normal limits in parenchymal echogenicity.  IMPRESSION: Cholelithiasis, without associated sonographic findings to suggest acute cholecystitis.   Electronically Signed   By: Charline BillsSriyesh  Krishnan M.D.   On: 07/19/2013 07:00    Medications:  I have reviewed the patient's current medications. Scheduled: . aspirin EC  81 mg Oral Daily  .  atorvastatin  20 mg Oral q1800  . cefTRIAXone (ROCEPHIN)  IV  1 g Intravenous Q24H  . docusate sodium  100 mg Oral BID  . levothyroxine  125 mcg Oral QAC breakfast  . metoprolol succinate  25 mg Oral Daily  . rOPINIRole  1 mg Oral TID  . trihexyphenidyl  5 mg Oral TID WC  . warfarin  5 mg Oral Once per day on Sun Tue Thu  . warfarin  7.5 mg Oral Once per day on Mon Wed Fri Sat  . Warfarin - Pharmacist Dosing Inpatient   Does not apply q1800   Continuous: . sodium chloride 75 mL/hr at 07/19/13 0905  . 0.9 % NaCl with KCl 20 mEq / L 75 mL/hr at 07/20/13 0345   GMW:[id number]:acetaminophen, acetaminophen, HYDROcodone-acetaminophen, morphine injection, morphine injection, nitroGLYCERIN, ondansetron (ZOFRAN) IV, ondansetron (ZOFRAN) IV, ondansetron, ondansetron, polyethylene glycol, polyethylene glycol  Assessment/Plan: Active Problems:  Cholelithiasis Seen on hepatobiliary scan./  Has been made NPO in anticipation of surgery.  Will need to give us a time of planned surgery to coordinate reversal with Vit K.  THis can be done either for later this afternoon, or tomorrow morning.  I have put in a page to Dr. Johna SheriffHoxworth since his last note lacks this information.  Postop, will need to go on Heprain and resume Coumadin with bridging until INR 2.5 or greater.  Continue Rocephin as WBC is up and also provides valve SBE coverage. Mechanical Aortic Valve- As above  CAD- H/o CABG with his valve replacement  Pacer- No new issues  HTN-  Controlled  Hyperlipid- Had recent APE with LDL <70, well controlled  Hypothyroid- TSH at goal with his latest APE  Today's Plan: NPO  Currently, attempting to get ahold of surgery for timing so we can deal with his anticoagulation appropriately.  Addendum:  Spoke with Hoxworth, goal INR 1.6 or lower by tomorrow AM.  LOS: 1 day   Zimir Kittleson W 07/20/2013, 8:24 AM

## 2013-07-21 ENCOUNTER — Inpatient Hospital Stay (HOSPITAL_COMMUNITY): Payer: Medicare HMO | Admitting: Certified Registered Nurse Anesthetist

## 2013-07-21 ENCOUNTER — Encounter (HOSPITAL_COMMUNITY)
Admission: EM | Disposition: A | Discharge status: Home / Self Care | Payer: Self-pay | Source: Home / Self Care | Attending: Internal Medicine

## 2013-07-21 ENCOUNTER — Encounter (HOSPITAL_COMMUNITY): Payer: Medicare HMO | Admitting: Certified Registered Nurse Anesthetist

## 2013-07-21 DIAGNOSIS — K750 Abscess of liver: Secondary | ICD-10-CM

## 2013-07-21 DIAGNOSIS — K8000 Calculus of gallbladder with acute cholecystitis without obstruction: Secondary | ICD-10-CM

## 2013-07-21 HISTORY — PX: CHOLECYSTECTOMY: SHX55

## 2013-07-21 LAB — COMPREHENSIVE METABOLIC PANEL
ALBUMIN: 3.3 g/dL — AB (ref 3.5–5.2)
ALT: 20 U/L (ref 0–53)
AST: 21 U/L (ref 0–37)
Alkaline Phosphatase: 78 U/L (ref 39–117)
BUN: 14 mg/dL (ref 6–23)
CO2: 27 meq/L (ref 19–32)
CREATININE: 0.72 mg/dL (ref 0.50–1.35)
Calcium: 9.2 mg/dL (ref 8.4–10.5)
Chloride: 100 mEq/L (ref 96–112)
GFR calc Af Amer: >90 mL/min (ref >90)
Glucose, Bld: 103 mg/dL — ABNORMAL HIGH (ref 70–99)
Potassium: 4.5 mEq/L (ref 3.7–5.3)
Sodium: 139 mEq/L (ref 137–147)
Total Bilirubin: 2.1 mg/dL — ABNORMAL HIGH (ref 0.3–1.2)
Total Protein: 6.9 g/dL (ref 6.0–8.3)

## 2013-07-21 LAB — PREPARE FRESH FROZEN PLASMA
UNIT DIVISION: 0
Unit division: 0
Unit division: 0
Unit division: 0
Unit division: 0

## 2013-07-21 LAB — MDC_IDC_ENUM_SESS_TYPE_REMOTE
Brady Statistic AP VS Percent: 61 %
Brady Statistic AS VS Percent: 38 %
Lead Channel Impedance Value: 431 Ohm
Lead Channel Pacing Threshold Amplitude: 0.625 V
Lead Channel Pacing Threshold Pulse Width: 0.4 ms
Lead Channel Sensing Intrinsic Amplitude: 1.4 mV
Lead Channel Sensing Intrinsic Amplitude: 11.2 mV
Lead Channel Setting Pacing Amplitude: 2 V
Lead Channel Setting Pacing Pulse Width: 0.4 ms
MDC IDC MSMT BATTERY IMPEDANCE: 160 Ohm
MDC IDC MSMT BATTERY REMAINING LONGEVITY: 127 mo
MDC IDC MSMT BATTERY VOLTAGE: 2.78 V
MDC IDC MSMT LEADCHNL RA IMPEDANCE VALUE: 486 Ohm
MDC IDC SESS DTM: 20150227131703
MDC IDC SET LEADCHNL RV PACING AMPLITUDE: 2.5 V
MDC IDC SET LEADCHNL RV SENSING SENSITIVITY: 4 mV
MDC IDC STAT BRADY AP VP PERCENT: 1 %
MDC IDC STAT BRADY AS VP PERCENT: 0 %

## 2013-07-21 LAB — CBC
HCT: 36.2 % — ABNORMAL LOW (ref 39.0–52.0)
Hemoglobin: 11.9 g/dL — ABNORMAL LOW (ref 13.0–17.0)
MCH: 27.5 pg (ref 26.0–34.0)
MCHC: 32.9 g/dL (ref 30.0–36.0)
MCV: 83.8 fL (ref 78.0–100.0)
PLATELETS: 161 10*3/uL (ref 150–400)
RBC: 4.32 MIL/uL (ref 4.22–5.81)
RDW: 13.4 % (ref 11.5–15.5)
WBC: 13 10*3/uL — ABNORMAL HIGH (ref 4.0–10.5)

## 2013-07-21 LAB — PROTIME-INR
INR: 2.2 — ABNORMAL HIGH (ref 0.00–1.49)
INR: 1.94 — AB (ref 0.00–1.49)
Prothrombin Time: 23.7 seconds — ABNORMAL HIGH (ref 11.6–15.2)
Prothrombin Time: 21.6 seconds — ABNORMAL HIGH (ref 11.6–15.2)

## 2013-07-21 LAB — SURGICAL PCR SCREEN
MRSA, PCR: NEGATIVE
Staphylococcus aureus: NEGATIVE

## 2013-07-21 SURGERY — LAPAROSCOPIC CHOLECYSTECTOMY
Anesthesia: General | Site: Abdomen

## 2013-07-21 MED ORDER — FENTANYL CITRATE 0.05 MG/ML IJ SOLN: 25.0000 ug | INTRAMUSCULAR | Status: DC | PRN

## 2013-07-21 MED ORDER — MORPHINE SULFATE 2 MG/ML IJ SOLN: 1.0000 mg | INTRAMUSCULAR | Status: DC | PRN

## 2013-07-21 MED ORDER — ROCURONIUM BROMIDE 100 MG/10ML IV SOLN
INTRAVENOUS | Status: DC | PRN
  Administered 2013-07-21: 20 mg via INTRAVENOUS
  Administered 2013-07-21: 30 mg via INTRAVENOUS

## 2013-07-21 MED ORDER — HEMOSTATIC AGENTS (NO CHARGE) OPTIME
TOPICAL | Status: DC | PRN
  Administered 2013-07-21: 1 via TOPICAL

## 2013-07-21 MED ORDER — 0.9 % SODIUM CHLORIDE (POUR BTL) OPTIME
TOPICAL | Status: DC | PRN
  Administered 2013-07-21: 1000 mL

## 2013-07-21 MED ORDER — GLYCOPYRROLATE 0.2 MG/ML IJ SOLN
INTRAMUSCULAR | Status: AC
  Filled 2013-07-21: qty 3

## 2013-07-21 MED ORDER — FENTANYL CITRATE 0.05 MG/ML IJ SOLN
INTRAMUSCULAR | Status: AC
  Filled 2013-07-21: qty 5

## 2013-07-21 MED ORDER — GLYCOPYRROLATE 0.2 MG/ML IJ SOLN
INTRAMUSCULAR | Status: DC | PRN
  Administered 2013-07-21: .8 mg via INTRAVENOUS

## 2013-07-21 MED ORDER — NEOSTIGMINE METHYLSULFATE 1 MG/ML IJ SOLN
INTRAMUSCULAR | Status: DC | PRN
  Administered 2013-07-21: 5 mg via INTRAVENOUS

## 2013-07-21 MED ORDER — FENTANYL CITRATE 0.05 MG/ML IJ SOLN
INTRAMUSCULAR | Status: DC | PRN
  Administered 2013-07-21: 50 ug via INTRAVENOUS
  Administered 2013-07-21: 150 ug via INTRAVENOUS

## 2013-07-21 MED ORDER — PHENYLEPHRINE HCL 10 MG/ML IJ SOLN
10.0000 mg | INTRAMUSCULAR | Status: DC | PRN
  Administered 2013-07-21: 50 ug/min via INTRAVENOUS

## 2013-07-21 MED ORDER — LACTATED RINGERS IV SOLN
INTRAVENOUS | Status: DC | PRN
  Administered 2013-07-21: 14:00:00 via INTRAVENOUS

## 2013-07-21 MED ORDER — LIDOCAINE HCL (CARDIAC) 20 MG/ML IV SOLN
INTRAVENOUS | Status: DC | PRN
  Administered 2013-07-21: 50 mg via INTRAVENOUS

## 2013-07-21 MED ORDER — PROPOFOL 10 MG/ML IV BOLUS
INTRAVENOUS | Status: DC | PRN
  Administered 2013-07-21: 160 mg via INTRAVENOUS

## 2013-07-21 MED ORDER — LACTATED RINGERS IV SOLN
INTRAVENOUS | Status: DC
  Administered 2013-07-21: 14:00:00 via INTRAVENOUS

## 2013-07-21 MED ORDER — PROPOFOL 10 MG/ML IV BOLUS
INTRAVENOUS | Status: AC
  Filled 2013-07-21: qty 20

## 2013-07-21 MED ORDER — SUCCINYLCHOLINE CHLORIDE 20 MG/ML IJ SOLN
INTRAMUSCULAR | Status: DC | PRN
  Administered 2013-07-21: 100 mg via INTRAVENOUS

## 2013-07-21 MED ORDER — ONDANSETRON HCL 4 MG/2ML IJ SOLN
INTRAMUSCULAR | Status: DC | PRN
  Administered 2013-07-21: 4 mg via INTRAVENOUS

## 2013-07-21 MED ORDER — PHENYLEPHRINE HCL 10 MG/ML IJ SOLN
INTRAMUSCULAR | Status: DC | PRN
  Administered 2013-07-21: 200 ug via INTRAVENOUS

## 2013-07-21 MED ORDER — MIDAZOLAM HCL 2 MG/2ML IJ SOLN
INTRAMUSCULAR | Status: AC
  Filled 2013-07-21: qty 2

## 2013-07-21 MED ORDER — LIDOCAINE HCL (CARDIAC) 20 MG/ML IV SOLN
INTRAVENOUS | Status: AC
  Filled 2013-07-21: qty 5

## 2013-07-21 SURGICAL SUPPLY — 55 items
APPLIER CLIP ROT 10 11.4 M/L (STAPLE) ×3
BLADE SURG ROTATE 9660 (MISCELLANEOUS) ×3 IMPLANT
CANISTER SUCTION 2500CC (MISCELLANEOUS) ×3 IMPLANT
CHLORAPREP W/TINT 26ML (MISCELLANEOUS) ×3 IMPLANT
CLIP APPLIE ROT 10 11.4 M/L (STAPLE) ×2 IMPLANT
COVER MAYO STAND STRL (DRAPES) ×3 IMPLANT
COVER SURGICAL LIGHT HANDLE (MISCELLANEOUS) ×3 IMPLANT
DECANTER SPIKE VIAL GLASS SM (MISCELLANEOUS) IMPLANT
DERMABOND ADVANCED (GAUZE/BANDAGES/DRESSINGS) ×1
DERMABOND ADVANCED .7 DNX12 (GAUZE/BANDAGES/DRESSINGS) ×2 IMPLANT
DRAIN CHANNEL 19F RND (DRAIN) ×3 IMPLANT
DRAPE C-ARM 42X72 X-RAY (DRAPES) ×3 IMPLANT
DRAPE UTILITY 15X26 W/TAPE STR (DRAPE) ×6 IMPLANT
ELECT REM PT RETURN 9FT ADLT (ELECTROSURGICAL) ×3
ELECTRODE REM PT RTRN 9FT ADLT (ELECTROSURGICAL) ×2 IMPLANT
ENDOLOOP SUT PDS II  0 18 (SUTURE) ×1
ENDOLOOP SUT PDS II 0 18 (SUTURE) ×2 IMPLANT
EVACUATOR SILICONE 100CC (DRAIN) ×3 IMPLANT
GLOVE BIO SURGEON STRL SZ7.5 (GLOVE) ×3 IMPLANT
GLOVE BIO SURGEON STRL SZ8.5 (GLOVE) ×3 IMPLANT
GLOVE BIOGEL PI IND STRL 7.0 (GLOVE) ×2 IMPLANT
GLOVE BIOGEL PI IND STRL 7.5 (GLOVE) ×4 IMPLANT
GLOVE BIOGEL PI IND STRL 8 (GLOVE) ×4 IMPLANT
GLOVE BIOGEL PI INDICATOR 7.0 (GLOVE) ×1
GLOVE BIOGEL PI INDICATOR 7.5 (GLOVE) ×2
GLOVE BIOGEL PI INDICATOR 8 (GLOVE) ×2
GLOVE ECLIPSE 6.5 STRL STRAW (GLOVE) ×3 IMPLANT
GLOVE ECLIPSE 7.5 STRL STRAW (GLOVE) ×3 IMPLANT
GLOVE SS BIOGEL STRL SZ 7.5 (GLOVE) ×2 IMPLANT
GLOVE SUPERSENSE BIOGEL SZ 7.5 (GLOVE) ×1
GLOVE SURG SS PI 6.5 STRL IVOR (GLOVE) ×3 IMPLANT
GLOVE SURG SS PI 7.0 STRL IVOR (GLOVE) ×3 IMPLANT
GOWN STRL NON-REIN LRG LVL3 (GOWN DISPOSABLE) ×6 IMPLANT
GOWN STRL REIN XL XLG (GOWN DISPOSABLE) ×6 IMPLANT
HEMOSTAT SNOW SURGICEL 2X4 (HEMOSTASIS) ×3 IMPLANT
KIT BASIN OR (CUSTOM PROCEDURE TRAY) ×3 IMPLANT
KIT ROOM TURNOVER OR (KITS) ×3 IMPLANT
NS IRRIG 1000ML POUR BTL (IV SOLUTION) ×3 IMPLANT
PAD ARMBOARD 7.5X6 YLW CONV (MISCELLANEOUS) ×3 IMPLANT
POUCH RETRIEVAL ECOSAC 10 (ENDOMECHANICALS) ×2 IMPLANT
POUCH RETRIEVAL ECOSAC 10MM (ENDOMECHANICALS) ×1
SCISSORS LAP 5X35 DISP (ENDOMECHANICALS) ×3 IMPLANT
SET CHOLANGIOGRAPH 5 50 .035 (SET/KITS/TRAYS/PACK) ×3 IMPLANT
SET IRRIG TUBING LAPAROSCOPIC (IRRIGATION / IRRIGATOR) ×3 IMPLANT
SLEEVE ENDOPATH XCEL 5M (ENDOMECHANICALS) ×3 IMPLANT
SPECIMEN JAR SMALL (MISCELLANEOUS) ×3 IMPLANT
SUT ETHILON 3 0 FSL (SUTURE) ×3 IMPLANT
SUT MON AB 5-0 PS2 18 (SUTURE) ×3 IMPLANT
TOWEL OR 17X24 6PK STRL BLUE (TOWEL DISPOSABLE) ×3 IMPLANT
TOWEL OR 17X26 10 PK STRL BLUE (TOWEL DISPOSABLE) ×3 IMPLANT
TOWEL OR NON WOVEN STRL DISP B (DISPOSABLE) ×3 IMPLANT
TRAY LAPAROSCOPIC (CUSTOM PROCEDURE TRAY) ×3 IMPLANT
TROCAR XCEL BLUNT TIP 100MML (ENDOMECHANICALS) ×3 IMPLANT
TROCAR XCEL NON-BLD 11X100MML (ENDOMECHANICALS) ×3 IMPLANT
TROCAR XCEL NON-BLD 5MMX100MML (ENDOMECHANICALS) ×3 IMPLANT

## 2013-07-21 NOTE — Anesthesia Procedure Notes (Signed)
Procedure Name: Intubation Date/Time: 07/21/2013 2:45 PM Performed by: Orvilla FusATO, Milee Qualls A Pre-anesthesia Checklist: Patient identified, Timeout performed, Emergency Drugs available, Suction available and Patient being monitored Patient Re-evaluated:Patient Re-evaluated prior to inductionOxygen Delivery Method: Circle system utilized Preoxygenation: Pre-oxygenation with 100% oxygen Intubation Type: IV induction Ventilation: Mask ventilation without difficulty Laryngoscope Size: Mac and 4 Grade View: Grade I Tube type: Oral Tube size: 7.5 mm Number of attempts: 1 Airway Equipment and Method: Stylet Placement Confirmation: ETT inserted through vocal cords under direct vision,  breath sounds checked- equal and bilateral and positive ETCO2 Secured at: 23 cm Tube secured with: Tape Dental Injury: Teeth and Oropharynx as per pre-operative assessment

## 2013-07-21 NOTE — Anesthesia Postprocedure Evaluation (Signed)
  Anesthesia Post-op Note  Patient: Wonda HornerFrank R Najarian  Procedure(s) Performed: Procedure(s): LAPAROSCOPIC CHOLECYSTECTOMY (N/A)  Patient Location: PACU  Anesthesia Type:General  Level of Consciousness: awake  Airway and Oxygen Therapy: Patient Spontanous Breathing  Post-op Pain: mild  Post-op Assessment: Post-op Vital signs reviewed  Post-op Vital Signs: Reviewed  Complications: No apparent anesthesia complications

## 2013-07-21 NOTE — Preoperative (Signed)
Beta Blockers   Reason not to administer Beta Blockers:Not Applicable 

## 2013-07-21 NOTE — Progress Notes (Signed)
Subjective: Despite my orders, they neglected to draw his INR with his other labs this AM, so I have added the order on for a STAT draw this AM.  Received 3 units FFP total, INR dropped from 3.4 to 2.5 after 2 units, so his level should be adequate.  Uneventful 24hrs otherwise.  Objective: Vital signs in last 24 hours: Temp:  [97.8 F (36.6 C)-100 F (37.8 C)] 100 F (37.8 C) (03/06 0534) Pulse Rate:  [61-94] 71 (03/06 0534) Resp:  [17-24] 18 (03/06 0534) BP: (101-141)/(60-94) 101/62 mmHg (03/06 0534) SpO2:  [90 %-98 %] 90 % (03/06 0534) Weight change:  Last BM Date: 07/18/13  Intake/Output from previous day: 03/05 0701 - 03/06 0700 In: 2266.8 [P.O.:120; I.V.:618.8; ZOXWR:6045Blood:1478; IV Piggyback:50] Out: -  Intake/Output this shift:   General appearance: alert, cooperative and appears stated age  Resp: clear to auscultation bilaterally  Cardio: regular rate and rhythm, S1, S2 normal,mechanical aortic sound crisp, pacer site clean.  GI: soft,RUQ tenderness, unchanged Extremities: extremities normal, atraumatic, no cyanosis or edema  Pulses: 2+ and symmetric  Lymph nodes: Cervical adenopathy: no cervical lymphadenopathy  Neurologic: Alert and oriented X 3, Parkinsonian tremor   Lab Results:  Recent Labs  07/20/13 0545 07/21/13 0635  WBC 13.2* 13.0*  HGB 12.8* 11.9*  HCT 39.5 36.2*  PLT 160 161   BMET  Recent Labs  07/19/13 0055 07/20/13 0545  NA 144 141  K 4.0 4.4  CL 107 103  CO2 25 25  GLUCOSE 150* 101*  BUN 17 15  CREATININE 0.68 0.67  CALCIUM 9.4 8.8    Studies/Results: Nm Hepatobiliary Liver Func  07/19/2013   CLINICAL DATA:  73 year old male with abdominal pain nausea and vomiting plus cholelithiasis. Initial encounter.  EXAM: NUCLEAR MEDICINE HEPATOBILIARY IMAGING  TECHNIQUE: Sequential images of the abdomen were obtained out to 60 minutes following intravenous administration of radiopharmaceutical.  COMPARISON:  right upper quadrant ultrasound 07/19/2013.  CT Abdomen and Pelvis 07/19/2013.  RADIOPHARMACEUTICALS:  5.0 mCi Tc-3452m Choletec  FINDINGS: Prompt radiotracer uptake by the liver and clearance of the blood pool. Prompt CBD activity by 10-15 min. Small bowel activity by 15-20 min. Over the first 60 min no gallbladder activity is identified, but there is progressive clearance of hepatic tracer into the small bowel.  Decision was then made to augment the study with 3.2 mg IV morphine. Still, no definite gallbladder activity occurred for additional 30 min of imaging.  IMPRESSION: No definite gallbladder activity despite augmentation of the study with morphine, and 90 min total imaging. As such, cystic duct occlusion cannot be excluded. The CBD is patent.   Electronically Signed   By: Augusto GambleLee  Hall M.D.   On: 07/19/2013 14:41    Medications:  I have reviewed the patient's current medications. Scheduled: . atorvastatin  20 mg Oral q1800  . cefTRIAXone (ROCEPHIN)  IV  1 g Intravenous Q24H  . docusate sodium  100 mg Oral BID  . levothyroxine  125 mcg Oral QAC breakfast  . metoprolol succinate  25 mg Oral Daily  . rOPINIRole  1 mg Oral TID  . trihexyphenidyl  5 mg Oral TID WC   Continuous: . sodium chloride 75 mL/hr at 07/19/13 0905  . 0.9 % NaCl with KCl 20 mEq / L 75 mL/hr at 07/21/13 [id number]   JXB:JYNWGNFAOZHYQPRN:acetaminophen, acetaminophen, HYDROcodone-acetaminophen, morphine injection, nitroGLYCERIN, ondansetron (ZOFRAN) IV, ondansetron (ZOFRAN) IV, ondansetron, ondansetron, polyethylene glycol, polyethylene glycol  Assessment/Plan: Active Problems:  Cholelithiasis Seen on hepatobiliary scan./ Has been made NPO  in anticipation of surgery at 11 today, Stat INR drawn as needs result preop since was missed this AM.  Will give FFP if still a bit high, per conversation, 1.6 ideal, but below 2 is acceptable.  Will need to resume Coumadin with Lovenox bridge postoperatively as long as his surgery is uneventful. SBE coverage with Rocephin.  Mechanical Aortic Valve- As  above  CAD- H/o CABG with his valve replacement  Pacer- No new issues  HTN- Controlled  Hyperlipid- Had recent APE with LDL <70, well controlled  Hypothyroid- TSH at goal with his latest APE    LOS: 2 days   Donald [name] W 07/21/2013, 7:52 AM

## 2013-07-21 NOTE — Transfer of Care (Signed)
Immediate Anesthesia Transfer of Care Note  Patient: Donald HornerFrank R [name]  Procedure(s) Performed: Procedure(s): LAPAROSCOPIC CHOLECYSTECTOMY (N/A)  Patient Location: PACU  Anesthesia Type:General  Level of Consciousness: awake  Airway & Oxygen Therapy: Patient Spontanous Breathing and Patient connected to nasal cannula oxygen  Post-op Assessment: Report given to PACU RN and Post -op Vital signs reviewed and stable  Post vital signs: Reviewed and stable  Complications: No apparent anesthesia complications

## 2013-07-21 NOTE — Progress Notes (Signed)
Subjective: Pt still c/o abdominal pain.  No N/V.  NPO.  Anxious for surgery.  PT/INR not drawn with the rest of the labs for some reason even though ordered.  They came back up to re-stick him so its now pending.  Given 5 units of FFP so far.  Objective: Vital signs in last 24 hours: Temp:  [97.8 F (36.6 C)-100 F (37.8 C)] 100 F (37.8 C) (03/06 0534) Pulse Rate:  [61-94] 71 (03/06 0534) Resp:  [17-24] 18 (03/06 0534) BP: (101-141)/(60-94) 101/62 mmHg (03/06 0534) SpO2:  [90 %-98 %] 90 % (03/06 0534) Last BM Date: 07/18/13  Intake/Output from previous day: 03/05 0701 - 03/06 0700 In: 2266.8 [P.O.:120; I.V.:618.8; ZOXWR:6045Blood:1478; IV Piggyback:50] Out: -  Intake/Output this shift:    PE: Gen:  Alert, NAD, pleasant Abd: Soft, mild tenderness in RUQ, mild distension, +BS, no HSM, well healed abdominal scars noted in RLQ from kidney stone surgery in the 1980's.   Lab Results:   Recent Labs  07/20/13 0545 07/21/13 0635  WBC 13.2* 13.0*  HGB 12.8* 11.9*  HCT 39.5 36.2*  PLT 160 161   BMET  Recent Labs  07/20/13 0545 07/21/13 0635  NA 141 139  K 4.4 4.5  CL 103 100  CO2 25 27  GLUCOSE 101* 103*  BUN 15 14  CREATININE 0.67 0.72  CALCIUM 8.8 9.2   PT/INR  Recent Labs  07/20/13 0545 07/20/13 1440  LABPROT 33.7* 26.4*  INR 3.48* 2.53*   CMP     Component Value Date/Time   NA 139 07/21/2013 0635   K 4.5 07/21/2013 0635   CL 100 07/21/2013 0635   CO2 27 07/21/2013 0635   GLUCOSE 103* 07/21/2013 0635   BUN 14 07/21/2013 0635   CREATININE 0.72 07/21/2013 0635   CALCIUM 9.2 07/21/2013 0635   PROT 6.9 07/21/2013 0635   ALBUMIN 3.3* 07/21/2013 0635   AST 21 07/21/2013 0635   ALT 20 07/21/2013 0635   ALKPHOS 78 07/21/2013 0635   BILITOT 2.1* 07/21/2013 0635   GFRNONAA >90 07/21/2013 0635   GFRAA >90 07/21/2013 0635   Lipase     Component Value Date/Time   LIPASE 18 07/19/2013 0055       Studies/Results: Nm Hepatobiliary Liver Func  07/19/2013   CLINICAL DATA:  73 year old  male with abdominal pain nausea and vomiting plus cholelithiasis. Initial encounter.  EXAM: NUCLEAR MEDICINE HEPATOBILIARY IMAGING  TECHNIQUE: Sequential images of the abdomen were obtained out to 60 minutes following intravenous administration of radiopharmaceutical.  COMPARISON:  right upper quadrant ultrasound 07/19/2013. CT Abdomen and Pelvis 07/19/2013.  RADIOPHARMACEUTICALS:  5.0 mCi Tc-1976m Choletec  FINDINGS: Prompt radiotracer uptake by the liver and clearance of the blood pool. Prompt CBD activity by 10-15 min. Small bowel activity by 15-20 min. Over the first 60 min no gallbladder activity is identified, but there is progressive clearance of hepatic tracer into the small bowel.  Decision was then made to augment the study with 3.2 mg IV morphine. Still, no definite gallbladder activity occurred for additional 30 min of imaging.  IMPRESSION: No definite gallbladder activity despite augmentation of the study with morphine, and 90 min total imaging. As such, cystic duct occlusion cannot be excluded. The CBD is patent.   Electronically Signed   By: Augusto GambleLee  Hall M.D.   On: 07/19/2013 14:41    Anti-infectives: Anti-infectives   Start     Dose/Rate Route Frequency Ordered Stop   07/19/13 0900  cefTRIAXone (ROCEPHIN) 1 g in dextrose 5 %  50 mL IVPB     1 g 100 mL/hr over 30 Minutes Intravenous Every 24 hours 07/19/13 0807         Assessment/Plan Cholecystitis with Cholelithiasis  Anticoagulated on coumadin for AFIB  Multiple chronic medical problems including CAD, AS, HTN, HLD, Parkinsons, GERD, sick sinus syndrome s/p pacemaker   Plan:  1. Still attempting to reverse INR, has been given 5 units of FFP, PT/INR pending, INR at 1400 was 2.53 2. NPO, IVF, pain control, antiemetics, antibiotics (Rocephin - consider switching antibiotic)  3. Ambulate and IS  4. Discontinued Coumadin and aspirin,  SCD's  5. NPO, consent form 6. Scheduled for 1100 if INR better     LOS: 2 days    DORT,  Fionnuala Hemmerich 07/21/2013, 8:51 AM Pager: [phone]

## 2013-07-21 NOTE — Anesthesia Preprocedure Evaluation (Addendum)
Anesthesia Evaluation  Patient identified by MRN, date of birth, ID band Patient awake  General Assessment Comment:Hearing aid on right   History of Anesthesia Complications Negative for: history of anesthetic complications  Airway Mallampati: II TM Distance: >3 FB Neck ROM: Full    Dental  (+) Dental Advisory Given, Teeth Intact,    Pulmonary shortness of breath,          Cardiovascular hypertension, Pt. on home beta blockers + CAD + dysrhythmias + pacemaker Rhythm:Regular Rate:Normal     Neuro/Psych Parkinson's Disease  Neuromuscular disease    GI/Hepatic hiatal hernia, GERD-  Medicated and Controlled,(+) Hepatitis -, A  Endo/Other  Hyperthyroidism   Renal/GU Renal disease     Musculoskeletal   Abdominal   Peds  Hematology   Anesthesia Other Findings   Reproductive/Obstetrics                          Anesthesia Physical Anesthesia Plan  ASA: III  Anesthesia Plan: General   Post-op Pain Management:    Induction: Intravenous  Airway Management Planned: Oral ETT  Additional Equipment:   Intra-op Plan:   Post-operative Plan: Possible Post-op intubation/ventilation  Informed Consent: I have reviewed the patients History and Physical, chart, labs and discussed the procedure including the risks, benefits and alternatives for the proposed anesthesia with the patient or authorized representative who has indicated his/her understanding and acceptance.   Dental advisory given  Plan Discussed with: CRNA, Anesthesiologist and Surgeon  Anesthesia Plan Comments:         Anesthesia Quick Evaluation

## 2013-07-21 NOTE — Op Note (Signed)
Preoperative Diagnosis:  Cholelithiasis and acute cholecystitis  Postoprative Diagnosis:   Same  Procedure: Procedure(s): LAPAROSCOPIC CHOLECYSTECTOMY   Surgeon: Glenna FellowsHoxworth, Gaelle Adriance T   Assistants: Barnetta ChapelKelly Osborne PA  Anesthesia:  General endotracheal anesthesia  Indications: patient is a 73 year old male with multiple medical problems including severe Parkinson's who presents with upper abdominal pain. Workup has included a gallbladder ultrasound showing cholelithiasis and a HIHA scan showing nonvisualization of the gallbladder.  He has persistent tenderness over his gallbladder. Preoperatively his Coumadin anticoagulation has been reversed with fresh frozen plasma and we have elected to proceed with laparoscopic cholecystectomy. I discussed the surgery and risks with the patient and his wife detailed elsewhere  Procedure Detail:  Patient was brought to the operating room, placed in the supine position on the operating table, and general endotracheal anesthesia induced. The abdomen was widely sterilely prepped and draped. He was already on broad-spectrum IV antibiotics.  PAS port in place. Patient timeout was performed and correct procedure verified. Trocar sites were infiltrated with local anesthesia. Access was obtained with a 1 cm incision at the umbilicus to just carried down through the midline fascia and the peritoneum entered under direct vision and the Hassan trocar placed through a mattress suture of 0 Vicryl. Under direct vision a 11 mm trocar is placed in the subxiphoid area. There were some omental adhesions to the anterior abdominal wall in the right upper quadrant which were chronic of uncertain etiology and these were taken down sharply with scissor dissection. There was extensive inflammatory adhesions of the omentum up to the gallbladder which were taken down with blunt dissection exposing a tensely distended acutely inflamed gallbladder with gangrenous changes. The gallbladder was  aspirated and decompressed. The fundus was then grasped and elevated above the liver. The infundibulum was exposed and grasped and elevated. Using mostly blunt and some cautery dissection staying on the gallbladder wall the dissection was carried down toward the porta hepatis. The cystic artery was clearly seen coursing up onto the gallbladder wall and was divided between 2 proximal one distal clip. Further dissection of the gallbladder wall progressed until it tapered down to the cystic duct. The cystic duct gallbladder junction was dissected 360 and the cystic duct dissected out over about a centimeter. A good critical view was obtained. I elected not to do a cholangiogram to 2 a fairly short cystic duct and the quality of the tissue and good anatomic view and normal liver function tests except for mildly elevated bilirubin. The cystic duct was then doubly clipped proximally, clipped distally and divided. The gallbladder was dissected out of its bed using cautery and blunt dissection and placed in a Endo Catch bag and brought up to the umbilicus. There was some mild bleeding from the gallbladder bed controlled with cautery. The right upper quadrant was thoroughly irrigated. A Surgicel pack was left in the gallbladder bed. A 19 Blake closed suction drain was left in the gallbladder bed and brought out through the lateral trocar site. All CO2 was evacuated and trochars removed. The mattress suture was secured the umbilicus. Sponge needle and instrument counts were correct.  Findings: Acute gangrenous cholecystitis  Estimated Blood Loss:  less than 100 mL         Drains: 19 Blake drain in subhepatic space  Blood Given: none          Specimens: gallbladder and contents        Complications:  * No complications entered in OR log *  Disposition: PACU - hemodynamically stable.         Condition: stable

## 2013-07-22 LAB — COMPREHENSIVE METABOLIC PANEL
ALT: 25 U/L (ref 0–53)
AST: 29 U/L (ref 0–37)
Albumin: 2.8 g/dL — ABNORMAL LOW (ref 3.5–5.2)
Alkaline Phosphatase: 70 U/L (ref 39–117)
BILIRUBIN TOTAL: 1.5 mg/dL — AB (ref 0.3–1.2)
BUN: 17 mg/dL (ref 6–23)
CO2: 26 meq/L (ref 19–32)
CREATININE: 0.68 mg/dL (ref 0.50–1.35)
Calcium: 8.6 mg/dL (ref 8.4–10.5)
Chloride: 101 mEq/L (ref 96–112)
GFR calc Af Amer: >90 mL/min (ref >90)
Glucose, Bld: 109 mg/dL — ABNORMAL HIGH (ref 70–99)
Potassium: 4 mEq/L (ref 3.7–5.3)
Sodium: 138 mEq/L (ref 137–147)
Total Protein: 6.3 g/dL (ref 6.0–8.3)

## 2013-07-22 LAB — CBC
HCT: 33.3 % — ABNORMAL LOW (ref 39.0–52.0)
Hemoglobin: 11.1 g/dL — ABNORMAL LOW (ref 13.0–17.0)
MCH: 28 pg (ref 26.0–34.0)
MCHC: 33.3 g/dL (ref 30.0–36.0)
MCV: 84.1 fL (ref 78.0–100.0)
Platelets: 158 10*3/uL (ref 150–400)
RBC: 3.96 MIL/uL — ABNORMAL LOW (ref 4.22–5.81)
RDW: 13.5 % (ref 11.5–15.5)
WBC: 7.7 10*3/uL (ref 4.0–10.5)

## 2013-07-22 LAB — PREPARE FRESH FROZEN PLASMA
UNIT DIVISION: 0
UNIT DIVISION: 0
Unit division: 0
Unit division: 0

## 2013-07-22 LAB — PROTIME-INR
INR: 2.23 — ABNORMAL HIGH (ref 0.00–1.49)
Prothrombin Time: 24 seconds — ABNORMAL HIGH (ref 11.6–15.2)

## 2013-07-22 MED ORDER — ENOXAPARIN SODIUM 80 MG/0.8ML ~~LOC~~ SOLN
80.0000 mg | Freq: Two times a day (BID) | SUBCUTANEOUS | Status: DC
  Administered 2013-07-22 – 2013-07-23 (×3): 80 mg via SUBCUTANEOUS
  Filled 2013-07-22 (×4): qty 0.8

## 2013-07-22 MED ORDER — DEXTROSE 5 % IV SOLN
1.0000 g | INTRAVENOUS | Status: DC
  Administered 2013-07-22: 1 g via INTRAVENOUS
  Filled 2013-07-22 (×2): qty 10

## 2013-07-22 MED ORDER — METRONIDAZOLE IN NACL 5-0.79 MG/ML-% IV SOLN
500.0000 mg | Freq: Three times a day (TID) | INTRAVENOUS | Status: DC
  Administered 2013-07-22 – 2013-07-23 (×3): 500 mg via INTRAVENOUS
  Filled 2013-07-22 (×5): qty 100

## 2013-07-22 MED ORDER — WARFARIN SODIUM 7.5 MG PO TABS
7.5000 mg | ORAL_TABLET | Freq: Once | ORAL | Status: DC
  Filled 2013-07-22: qty 1

## 2013-07-22 MED ORDER — WARFARIN - PHARMACIST DOSING INPATIENT: Freq: Every day | Status: DC

## 2013-07-22 NOTE — Progress Notes (Addendum)
1 Day Post-Op  Subjective: Feeling better s/p lap chole.  Passing regular gas but has not had a BM yet.  Advanced to regular diet for breakfast, but hasn't eaten yet.  Able to walk around regularly and overall feeling better.   Objective: Vital signs in last 24 hours: Temp:  [97.4 F (36.3 C)-99.2 F (37.3 C)] 98 F (36.7 C) (03/07 1021) Pulse Rate:  [87-110] 110 (03/07 1021) Resp:  [19-24] 20 (03/07 0542) BP: (90-174)/(60-156) 114/72 mmHg (03/07 1021) SpO2:  [92 %-100 %] 93 % (03/07 1021) Last BM Date: 07/18/13  Intake/Output from previous day: 03/06 0701 - 03/07 0700 In: 1799 [P.O.:360; I.V.:1230; Blood:209] Out: 265 [Urine:200; Drains:65] Intake/Output this shift:    PE: Gen:  Alert, NAD, pleasant, seated in recliner Card:  RRR, no M/G/R heard  To me he has an irregular rythm (DN) Pulm:  CTA, no W/R/R. IS to 1000 with minimal coaching.  Abd: Soft, Tender to RLQ. Non distended. Lap incisions C/D/I sealed with dermabond.  JP drain with minimal amount of serosanguinous drainage.  Slight brown tinge, but most likely due to surgical snow placed on liver.  7765ml/24hr. Ext:  No erythema, edema, or tenderness. Resting tremors.  Lab Results:   Recent Labs  07/21/13 0635 07/22/13 0350  WBC 13.0* 7.7  HGB 11.9* 11.1*  HCT 36.2* 33.3*  PLT 161 158   BMET  Recent Labs  07/21/13 0635 07/22/13 0350  NA 139 138  K 4.5 4.0  CL 100 101  CO2 27 26  GLUCOSE 103* 109*  BUN 14 17  CREATININE 0.72 0.68  CALCIUM 9.2 8.6   PT/INR  Recent Labs  07/21/13 1230 07/22/13 0350  LABPROT 21.6* 24.0*  INR 1.94* 2.23*   CMP     Component Value Date/Time   NA 138 07/22/2013 0350   K 4.0 07/22/2013 0350   CL 101 07/22/2013 0350   CO2 26 07/22/2013 0350   GLUCOSE 109* 07/22/2013 0350   BUN 17 07/22/2013 0350   CREATININE 0.68 07/22/2013 0350   CALCIUM 8.6 07/22/2013 0350   PROT 6.3 07/22/2013 0350   ALBUMIN 2.8* 07/22/2013 0350   AST 29 07/22/2013 0350   ALT 25 07/22/2013 0350   ALKPHOS 70  07/22/2013 0350   BILITOT 1.5* 07/22/2013 0350   GFRNONAA >90 07/22/2013 0350   GFRAA >90 07/22/2013 0350   Lipase     Component Value Date/Time   LIPASE 18 07/19/2013 0055       Studies/Results: No results found.  Anti-infectives: Anti-infectives   Start     Dose/Rate Route Frequency Ordered Stop   07/22/13 1200  cefTRIAXone (ROCEPHIN) 1 g in dextrose 5 % 50 mL IVPB     1 g 100 mL/hr over 30 Minutes Intravenous Every 24 hours 07/22/13 1039     07/22/13 1200  metroNIDAZOLE (FLAGYL) IVPB 500 mg     500 mg 100 mL/hr over 60 Minutes Intravenous Every 8 hours 07/22/13 1039     07/19/13 0900  cefTRIAXone (ROCEPHIN) 1 g in dextrose 5 % 50 mL IVPB  Status:  Discontinued     1 g 100 mL/hr over 30 Minutes Intravenous Every 24 hours 07/19/13 0807 07/22/13 0829     Flagyl 07/22/13 >>> Rocephin 07/19/13 >>> Present  Assessment/Plan Cholecystitis with Cholelithiasis  Anticoagulated on coumadin for AFIB  Multiple chronic medical problems including CAD, AS, HTN, HLD, Parkinsons, GERD, sick sinus syndrome s/p pacemaker  History of aortic valve replacement/CABG 2006  Plan:  1. Regular Diet, pain  control 2. Ambulate and IS  3. Lovenox and SCDs for VTE proph.  OK to resume coumadin morning of 07/23/13  Note - INR already - 2.4 on 07/22/2013. 4. Will discuss discharge time frame with surgeon 5. Re-started on Rocephin and flagyl added.   LOS: 3 days   [name] 07/22/2013, 10:48 AM [facility] Phone #: [phone]  Agree with above. Has tremor. Wife in room.  Ovidio Kin, MD, [facility] Pager: [phone] Office phone:  [phone]

## 2013-07-22 NOTE — Progress Notes (Signed)
Subjective: Feels good this AM.  Feels like he is ready for more calories and wants to get out of bed and move around.  Objective: Vital signs in last 24 hours: Temp:  [97.4 F (36.3 C)-101.2 F (38.4 C)] 97.9 F (36.6 C) (03/07 0542) Pulse Rate:  [87-106] 87 (03/07 0542) Resp:  [18-24] 20 (03/07 0542) BP: (90-174)/(60-156) 90/60 mmHg (03/07 0542) SpO2:  [92 %-100 %] 96 % (03/07 0542) Weight change:  Last BM Date: 07/18/13  Intake/Output from previous day: 03/06 0701 - 03/07 0700 In: 1799 [P.O.:360; I.V.:1230; Blood:209] Out: 265 [Urine:200; Drains:65] Intake/Output this shift:   General appearance: alert, cooperative and appears stated age  Resp: clear to auscultation bilaterally  Cardio: regular rate and rhythm, S1, S2 normal,mechanical aortic sound crisp, pacer site clean.  GI: soft,mild tenderness without rebound, trocar sites okExtremities: extremities normal, atraumatic, no cyanosis or edema  Pulses: 2+ and symmetric  Lymph nodes: Cervical adenopathy: no cervical lymphadenopathy  Neurologic: Alert and oriented X 3, Parkinsonian tremor  Lab Results:  Recent Labs  07/21/13 0635 07/22/13 0350  WBC 13.0* 7.7  HGB 11.9* 11.1*  HCT 36.2* 33.3*  PLT 161 158   BMET  Recent Labs  07/21/13 0635 07/22/13 0350  NA 139 138  K 4.5 4.0  CL 100 101  CO2 27 26  GLUCOSE 103* 109*  BUN 14 17  CREATININE 0.72 0.68  CALCIUM 9.2 8.6    Studies/Results: No results found.  Medications:  I have reviewed the patient's current medications. Scheduled: . atorvastatin  20 mg Oral q1800  . cefTRIAXone (ROCEPHIN)  IV  1 g Intravenous Q24H  . docusate sodium  100 mg Oral BID  . levothyroxine  125 mcg Oral QAC breakfast  . metoprolol succinate  25 mg Oral Daily  . rOPINIRole  1 mg Oral TID  . trihexyphenidyl  5 mg Oral TID WC   Continuous: . 0.9 % NaCl with KCl 20 mEq / L 75 mL/hr at 07/22/13 0544   AVW:[id number]:acetaminophen, acetaminophen, HYDROcodone-acetaminophen, morphine  injection, nitroGLYCERIN, ondansetron (ZOFRAN) IV, ondansetron (ZOFRAN) IV, ondansetron, ondansetron, polyethylene glycol  Assessment/Plan: Active Problems:  Cholelithiasis S/P Laparoscopic cholecystectomy.  Will complete liquids this AM and get on regular food by lunch.  Ambulate, D/C IVF.  SBE coverage with Rocephin ok to d/c postop Mechanical Aortic Valve- Coumadin reinitiation with what will likely be a day or 2 of Lovenox bridging as he is only down to 2.2 today with goal >2.5.  Ifgoing home tomorrow and not at goal can get Lovenox pre d/c to carry him through Monday. CAD- H/o CABG with his valve replacement  Pacer- No new issues  HTN- Controlled  Hyperlipid- Had recent APE with LDL <70, well controlled  Hypothyroid- TSH at goal with his latest APE   Plan today:  Mobilize, refeed, laxative if needed with goal of home tomorrow.   LOS: 3 days   Keidra Withers W 07/22/2013, 8:24 AM

## 2013-07-22 NOTE — Progress Notes (Addendum)
ANTICOAGULATION CONSULT NOTE - Follow Up Consult  Pharmacy Consult:  Lovenox / Coumadin Indication:  History of mechanical AVR  No Known Allergies  Patient Measurements: Height: 5\' 7"  (170.2 cm) Weight: 183 lb 9.6 oz (83.28 kg) IBW/kg (Calculated) : 66.1  Vital Signs: Temp: 97.9 F (36.6 C) (03/07 0542) Temp src: Oral (03/07 0542) BP: 90/60 mmHg (03/07 0542) Pulse Rate: 87 (03/07 0542)  Labs:  Recent Labs  07/20/13 0545  07/21/13 0635 07/21/13 0825 07/21/13 1230 07/22/13 0350  HGB 12.8*  --  11.9*  --   --  11.1*  HCT 39.5  --  36.2*  --   --  33.3*  PLT 160  --  161  --   --  158  APTT 57*  --   --   --   --   --   LABPROT 33.7*  < >  --  23.7* 21.6* 24.0*  INR 3.48*  < >  --  2.20* 1.94* 2.23*  CREATININE 0.67  --  0.72  --   --  0.68  < > = values in this interval not displayed.  Estimated Creatinine Clearance: 87.4 ml/min (by C-G formula based on Cr of 0.68).     Assessment: 72 YOM on Coumadin PTA for history of mechanical AVR.  Last Coumadin dose was on 07/19/13 and INR was reversed with FFP for cholecystectomy, now to resume Coumadin and add Lovenox bridge post-op.  INR slightly below goal and patient's renal function is appropriate for BID dosing of Lovenox.  No bleeding reported.   Goal of Therapy:  Anti-Xa level 0.6-1 units/ml 4hrs after LMWH dose given INR 2.5 - 3.5 per outpatient record Monitor platelets by anticoagulation protocol: Yes    Plan:  - Lovenox 80mg  SQ Q12H.  D/C when INR >/= 2.5 per MD order.  If need to go home on Lovenox, consider changing dose to 120mg  SQ Q24H for ease of administration. - Coumadin 7.5mg  PO today - Daily PT / INR - CBC daily per MD    Chelsea Aushuy D. Laney Potashang, PharmD, BCPS Pager:  [phone] 07/22/2013, 8:35 AM   =====================================  Addendum: - resume Coumadin tomorrow per Surgery.    Lilibeth Opie D. Laney Potashang, PharmD, BCPS Pager:  [phone] 07/22/2013, 10:07 AM

## 2013-07-23 LAB — CBC
HEMATOCRIT: 33.5 % — AB (ref 39.0–52.0)
Hemoglobin: 10.8 g/dL — ABNORMAL LOW (ref 13.0–17.0)
MCH: 26.9 pg (ref 26.0–34.0)
MCHC: 32.2 g/dL (ref 30.0–36.0)
MCV: 83.3 fL (ref 78.0–100.0)
Platelets: 184 10*3/uL (ref 150–400)
RBC: 4.02 MIL/uL — ABNORMAL LOW (ref 4.22–5.81)
RDW: 13.5 % (ref 11.5–15.5)
WBC: 7.5 10*3/uL (ref 4.0–10.5)

## 2013-07-23 LAB — PROTIME-INR
INR: 2.03 — ABNORMAL HIGH (ref 0.00–1.49)
Prothrombin Time: 22.3 seconds — ABNORMAL HIGH (ref 11.6–15.2)

## 2013-07-23 MED ORDER — DOCUSATE SODIUM 100 MG PO CAPS: 100.0000 mg | ORAL_CAPSULE | Freq: Two times a day (BID) | ORAL | Status: DC

## 2013-07-23 MED ORDER — AMOXICILLIN-POT CLAVULANATE 875-125 MG PO TABS: 1.0000 | ORAL_TABLET | Freq: Two times a day (BID) | ORAL | Status: DC

## 2013-07-23 MED ORDER — HYDROCODONE-ACETAMINOPHEN 5-325 MG PO TABS: 1.0000 | ORAL_TABLET | Freq: Four times a day (QID) | ORAL | Status: DC | PRN

## 2013-07-23 MED ORDER — ENOXAPARIN SODIUM 80 MG/0.8ML ~~LOC~~ SOLN: SUBCUTANEOUS | Status: DC

## 2013-07-23 MED ORDER — ENOXAPARIN SODIUM 40 MG/0.4ML ~~LOC~~ SOLN
40.0000 mg | Freq: Once | SUBCUTANEOUS | Status: AC
  Administered 2013-07-23: 40 mg via SUBCUTANEOUS
  Filled 2013-07-23: qty 0.4

## 2013-07-23 MED ORDER — WARFARIN SODIUM 3 MG PO TABS
3.0000 mg | ORAL_TABLET | Freq: Once | ORAL | Status: DC
  Filled 2013-07-23: qty 1

## 2013-07-23 NOTE — Progress Notes (Signed)
General Surgery Note  LOS: 4 days  POD -  2 Days Post-Op  Assessment/Plan: 1.   LAPAROSCOPIC CHOLECYSTECTOMY  Drainage is thin serous fluid - I removed drain  Discussed with Dr. Wylene Simmerisovec - he will plan to send him home on Augmentin  [name] will see Dr. Johna SheriffHoxworth in 2 or 3 weeks.  2.  Anticoagulated on coumadin for AFIB    Will bridge Lovenox for this 3.  Multiple chronic medical problems including CAD, AS, HTN, HLD, Parkinsons, GERD, sick sinus syndrome s/p pacemaker  4.  History of aortic valve replacement/CABG 2006  5.  DVT prophylaxis - Lovenox  Active Problems:   Cholelithiases   Cholelithiasis  Subjective:  Doing well.  Wife in room.  Discussed discharge plan. Objective:   Filed Vitals:   07/23/13 0529  BP: 108/70  Pulse: 85  Temp: 98 F (36.7 C)  Resp: 16     Intake/Output from previous day:  03/07 0701 - 03/08 0700 In: 630 [P.O.:480; IV Piggyback:150] Out: 75 [Drains:75]  Intake/Output this shift:  Total I/O In: 240 [P.O.:240] Out: 10 [Drains:10]   Physical Exam:   General: Older WM who is alert and oriented.  Has tremor   HEENT: Normal. Pupils equal. .   Lungs: Clear.   Abdomen: Soft.  BS present   Wound: Clean. Drain - fluid straw colored.  I removed the drain.   Lab Results:    Recent Labs  07/22/13 0350 07/23/13 0645  WBC 7.7 7.5  HGB 11.1* 10.8*  HCT 33.3* 33.5*  PLT 158 184    BMET   Recent Labs  07/21/13 0635 07/22/13 0350  NA 139 138  K 4.5 4.0  CL 100 101  CO2 27 26  GLUCOSE 103* 109*  BUN 14 17  CREATININE 0.72 0.68  CALCIUM 9.2 8.6    PT/INR   Recent Labs  07/22/13 0350 07/23/13 0645  LABPROT 24.0* 22.3*  INR 2.23* 2.03*    ABG  No results found for this basename: PHART, PCO2, PO2, HCO3,  in the last 72 hours   Studies/Results:  No results found.   Anti-infectives:   Anti-infectives   Start     Dose/Rate Route Frequency Ordered Stop   07/22/13 1200  cefTRIAXone (ROCEPHIN) 1 g in dextrose 5 % 50 mL IVPB      1 g 100 mL/hr over 30 Minutes Intravenous Every 24 hours 07/22/13 1039     07/22/13 1200  metroNIDAZOLE (FLAGYL) IVPB 500 mg     500 mg 100 mL/hr over 60 Minutes Intravenous Every 8 hours 07/22/13 1039     07/19/13 0900  cefTRIAXone (ROCEPHIN) 1 g in dextrose 5 % 50 mL IVPB  Status:  Discontinued     1 g 100 mL/hr over 30 Minutes Intravenous Every 24 hours 07/19/13 0807 07/22/13 0829      Ovidio Kinavid Karita Dralle, MD, FACS Pager: [phone] [facility] Office: [phone] 07/23/2013

## 2013-07-23 NOTE — Progress Notes (Signed)
ANTICOAGULATION CONSULT NOTE - Follow Up Consult  Pharmacy Consult:  Lovenox / Coumadin Indication:  History of mechanical AVR  No Known Allergies  Patient Measurements: Height: 5\' 7"  (170.2 cm) Weight: 183 lb 9.6 oz (83.28 kg) IBW/kg (Calculated) : 66.1  Vital Signs: Temp: 98 F (36.7 C) (03/08 0529) Temp src: Axillary (03/08 0529) BP: 108/70 mmHg (03/08 0529) Pulse Rate: 85 (03/08 0529)  Labs:  Recent Labs  07/21/13 0635  07/21/13 1230 07/22/13 0350 07/23/13 0645  HGB 11.9*  --   --  11.1* 10.8*  HCT 36.2*  --   --  33.3* 33.5*  PLT 161  --   --  158 184  LABPROT  --   < > 21.6* 24.0* 22.3*  INR  --   < > 1.94* 2.23* 2.03*  CREATININE 0.72  --   --  0.68  --   < > = values in this interval not displayed.  Estimated Creatinine Clearance: 87.4 ml/min (by C-G formula based on Cr of 0.68).     Assessment: 72 YOM on Coumadin PTA for history of mechanical AVR.  Last Coumadin dose was on 07/19/13 and INR was reversed with FFP for cholecystectomy.  Patient started on Lovenox 07/22/13 and to resume Coumadin today 07/23/13.  INR decreased to 2.03 and his renal function remains stable.  Noted hemoglobin drifted down but platelets remain WNL.  No bleeding reported.  Aware patient is on Flagyl and could increase INR; therefore, will reduce Coumadin dose instead of resuming patient's home dose.   Goal of Therapy:  Anti-Xa level 0.6-1 units/ml 4hrs after LMWH dose given INR 2.5 - 3.5 per outpatient record Monitor platelets by anticoagulation protocol: Yes    Plan:  - Lovenox 80mg  SQ Q12H.  D/C when INR >/= 2.5 per MD order.  If need to go home on Lovenox, consider changing dose to 120mg  SQ Q24H for ease of administration. - Coumadin 3mg  PO today - Daily PT / INR - CBC daily per MD - F/U abx LOT, ?change Rocephin/Flagyl to Zosyn    Karia Ehresman D. Laney Potashang, PharmD, BCPS Pager:  [phone] 07/23/2013, 8:29 AM

## 2013-07-23 NOTE — Progress Notes (Signed)
2 Days Post-Op  Subjective: Pt doing well, having some soreness.  Ambulating well.  Tolerating diet, no BM yet, but has flatus.  Wants to go home.  Objective: Vital signs in last 24 hours: Temp:  [98 F (36.7 C)-98.5 F (36.9 C)] 98 F (36.7 C) (03/08 0529) Pulse Rate:  [85-110] 85 (03/08 0529) Resp:  [16-18] 16 (03/08 0529) BP: (100-114)/(70-75) 108/70 mmHg (03/08 0529) SpO2:  [90 %-94 %] 93 % (03/08 0529) Last BM Date: 07/19/13  Intake/Output from previous day: 03/07 0701 - 03/08 0700 In: 630 [P.O.:480; IV Piggyback:150] Out: 75 [Drains:75] Intake/Output this shift:    PE: Gen:  Alert, NAD, pleasant Abd: Soft, mild tenderness, +BS, no HSM, incisions C/D/I, drain with minimal serosanguinous drainage (65ml/24hours).  I removed drain at bedside.   Lab Results:   Recent Labs  07/22/13 0350 07/23/13 0645  WBC 7.7 7.5  HGB 11.1* 10.8*  HCT 33.3* 33.5*  PLT 158 184   BMET  Recent Labs  07/21/13 0635 07/22/13 0350  NA 139 138  K 4.5 4.0  CL 100 101  CO2 27 26  GLUCOSE 103* 109*  BUN 14 17  CREATININE 0.72 0.68  CALCIUM 9.2 8.6   PT/INR  Recent Labs  07/22/13 0350 07/23/13 0645  LABPROT 24.0* 22.3*  INR 2.23* 2.03*   CMP     Component Value Date/Time   NA 138 07/22/2013 0350   K 4.0 07/22/2013 0350   CL 101 07/22/2013 0350   CO2 26 07/22/2013 0350   GLUCOSE 109* 07/22/2013 0350   BUN 17 07/22/2013 0350   CREATININE 0.68 07/22/2013 0350   CALCIUM 8.6 07/22/2013 0350   PROT 6.3 07/22/2013 0350   ALBUMIN 2.8* 07/22/2013 0350   AST 29 07/22/2013 0350   ALT 25 07/22/2013 0350   ALKPHOS 70 07/22/2013 0350   BILITOT 1.5* 07/22/2013 0350   GFRNONAA >90 07/22/2013 0350   GFRAA >90 07/22/2013 0350   Lipase     Component Value Date/Time   LIPASE 18 07/19/2013 0055    Studies/Results: No results found.  Anti-infectives: Anti-infectives   Start     Dose/Rate Route Frequency Ordered Stop   07/22/13 1200  cefTRIAXone (ROCEPHIN) 1 g in dextrose 5 % 50 mL IVPB     1 g 100 mL/hr  over 30 Minutes Intravenous Every 24 hours 07/22/13 1039     07/22/13 1200  metroNIDAZOLE (FLAGYL) IVPB 500 mg     500 mg 100 mL/hr over 60 Minutes Intravenous Every 8 hours 07/22/13 1039     07/19/13 0900  cefTRIAXone (ROCEPHIN) 1 g in dextrose 5 % 50 mL IVPB  Status:  Discontinued     1 g 100 mL/hr over 30 Minutes Intravenous Every 24 hours 07/19/13 0807 07/22/13 0829     Assessment/Plan Cholecystitis with Cholelithiasis  Anticoagulated on coumadin for AFIB  Multiple chronic medical problems including CAD, AS, HTN, HLD, Parkinsons, GERD, sick sinus syndrome s/p pacemaker, aortic valve replacement/CABG 2006   Plan: 1. Regular Diet, pain control  2. Ambulate and IS  3. Lovenox and SCDs for VTE proph. OK to resume coumadin today (07/23/13) Note - INR already - 2.03 on 07/23/2013.  4. Okay to d/c from surgical perspective when medically stable.  JP drain removed. ,   Follow up appointment with Dr. Johna Sheriff in 2-3 weeks. 5. Continue Rocephin and flagyl for 7 days post-op   LOS: 4 days    DORT, MEGAN 07/23/2013, 9:32 AM Pager: [phone]  Agree with above. Wife in  room during discharge instruction. I spoke with Dr. Wylene Simmerisovec regarding plans.  Ovidio Kinavid Anaiz Qazi, MD, [facility] Pager: [phone] Office phone:  [phone]

## 2013-07-23 NOTE — Discharge Summary (Signed)
DISCHARGE SUMMARY  Donald [name]  MR#: [id number]  DOB:08-20-1940  Date of Admission: 07/19/2013 Date of Discharge: 07/23/2013  Attending Physician:Naseem Adler W  Patient's WUJ:WJXBJYN,WGNFAOZ W, MD  Consults:Treatment Team:  Md Ccs, MD  Discharge Diagnoses: Active Problems:   Cholelithiases   Cholelithiasis AFib on Coumadin Mechanical Aortic Valve, Coumadin Parkinson's Hypothyroidism Hyperlipidemia HTN  Discharge Medications:   Medication List         amoxicillin-clavulanate 875-125 MG per tablet  Commonly known as:  AUGMENTIN  Take 1 tablet by mouth 2 (two) times daily.     aspirin 81 MG tablet  Take 81 mg by mouth daily.     atorvastatin 20 MG tablet  Commonly known as:  LIPITOR  Take 1 tablet (20 mg total) by mouth daily.     docusate sodium 100 MG capsule  Commonly known as:  COLACE  Take 1 capsule (100 mg total) by mouth 2 (two) times daily.     enoxaparin 80 MG/0.8ML injection  Commonly known as:  LOVENOX  120mg  subq daily     HYDROcodone-acetaminophen 5-325 MG per tablet  Commonly known as:  NORCO/VICODIN  Take 1-2 tablets by mouth every 6 (six) hours as needed for moderate pain.     levothyroxine 125 MCG tablet  Commonly known as:  SYNTHROID, LEVOTHROID  Take 125 mcg by mouth daily.     metoprolol succinate 25 MG 24 hr tablet  Commonly known as:  TOPROL-XL  Take 25 mg by mouth daily.     nitroGLYCERIN 0.4 MG SL tablet  Commonly known as:  NITROSTAT  Place 0.4 mg under the tongue every 5 (five) minutes as needed for chest pain.     rOPINIRole 1 MG tablet  Commonly known as:  REQUIP  Take 1 tablet (1 mg total) by mouth 3 (three) times daily.     trihexyphenidyl 5 MG tablet  Commonly known as:  ARTANE  Take 5 mg by mouth 3 (three) times daily with meals.     warfarin 5 MG tablet  Commonly known as:  COUMADIN  Take 5-7.5 mg by mouth daily. Take 1 tablet on Sunday, Tuesday and Thursday then take 1 and 1/2 tablets on Monday, Wednesday,  Friday and Saturday        Hospital Procedures: Nm Hepatobiliary Liver Func  07/19/2013   CLINICAL DATA:  73 year old male with abdominal pain nausea and vomiting plus cholelithiasis. Initial encounter.  EXAM: NUCLEAR MEDICINE HEPATOBILIARY IMAGING  TECHNIQUE: Sequential images of the abdomen were obtained out to 60 minutes following intravenous administration of radiopharmaceutical.  COMPARISON:  right upper quadrant ultrasound 07/19/2013. CT Abdomen and Pelvis 07/19/2013.  RADIOPHARMACEUTICALS:  5.0 mCi Tc-39m Choletec  FINDINGS: Prompt radiotracer uptake by the liver and clearance of the blood pool. Prompt CBD activity by 10-15 min. Small bowel activity by 15-20 min. Over the first 60 min no gallbladder activity is identified, but there is progressive clearance of hepatic tracer into the small bowel.  Decision was then made to augment the study with 3.2 mg IV morphine. Still, no definite gallbladder activity occurred for additional 30 min of imaging.  IMPRESSION: No definite gallbladder activity despite augmentation of the study with morphine, and 90 min total imaging. As such, cystic duct occlusion cannot be excluded. The CBD is patent.   Electronically Signed   By: Augusto Gamble M.D.   On: 07/19/2013 14:41   Ct Abdomen Pelvis W Contrast  07/19/2013   CLINICAL DATA:  Diffuse abdominal pain, worse in right upper quadrant. Nausea  and vomiting.  EXAM: CT ABDOMEN AND PELVIS WITH CONTRAST  TECHNIQUE: Multidetector CT imaging of the abdomen and pelvis was performed using the standard protocol following bolus administration of intravenous contrast.  CONTRAST:  25mL OMNIPAQUE IOHEXOL 300 MG/ML SOLN, OMNIPAQUE IOHEXOL 300 MG/ML SOLN  COMPARISON:  None available for comparison at time of study interpretation.  FINDINGS: Included view of the lung bases demonstrates mild cardiomegaly, median sternotomy and cardiac pacer wires. Apparent pulmonary edema, and bronchial wall thickening incompletely characterized.  The  gallbladder is mildly distended with a 11 mm gallstone at the neck. No superimposed inflammatory change of the gallbladder. The liver is unremarkable, no intrahepatic biliary dilatation or masses. Spleen, pancreas, adrenal glands are unremarkable.  Stomach, small and large bowel are normal in course and caliber, contrast is yet to reach the distal small bowel. Mild distal small bowel feces suggest chronic stasis. Normal appendix. Sigmoid diverticulosis without superimposed inflammatory changes. No intraperitoneal free fluid nor free air.  Kidneys are unremarkable. Delayed imaging demonstrates prompt symmetric excretion of contrast into the proximal urinary collecting system. Aortoiliac vessels are normal course and caliber with moderate to severe calcific atherosclerosis. Urinary bladder is partially distended and unremarkable. Small left ureterocele. Dystrophic prostate calcifications, the prostate is 5 cm in transaxial dimension.  Small fat containing umbilical hernias. Small right rectus femoris lipoma. Fat containing right lumbar hernia. Severe L5-S1 degenerative disc disease, mild to moderate L4-5.  IMPRESSION: Cholelithiasis with mild gallbladder distention and possibly impacted stone at the neck. No CT findings of acute cholecystitis.  Diverticulosis without CT findings of acute diverticulitis.  Suspected mild cardiomegaly and pulmonary edema.   Electronically Signed   By: Awilda Metro   On: 07/19/2013 04:34   US Abdomen Limited Ruq  07/19/2013   CLINICAL DATA:  Right upper quadrant pain, cholelithiasis on CT  EXAM: US ABDOMEN LIMITED - RIGHT UPPER QUADRANT  COMPARISON:  CT abdomen pelvis dated 07/19/2013  FINDINGS: Gallbladder:  1.2 cm gallstone with layering sludge/ debris. Gallbladder wall is at the upper limits of normal, measuring 3 mm. No pericholecystic fluid. Negative sonographic Murphy's sign.  Common bile duct:  Diameter: 7 mm.  Liver:  No focal lesion identified. Within normal limits in  parenchymal echogenicity.  IMPRESSION: Cholelithiasis, without associated sonographic findings to suggest acute cholecystitis.   Electronically Signed   By: Charline Bills M.D.   On: 07/19/2013 07:00    History of Present Illness: Patient is a 73 year old male with mechanical aortic valve, afib and pacer who came to the ER with RUQ pain, N/V following his dinner.  Hospital Course: Donald [name] was admitted and had a CT in the ER and labs suggesting biliary pathology.  He underwent U.S and HIDA scan sowing blockage of the cystic duct and some inflammation.  [facility]S was consulted for surgical need.  He was given 5 units FFP preop and his INR was <2, allowing surgery.  He underwent laparoscopic cholecystectomy which was uncomplicated.  He remained on Rocephin during his hospitalization for valve prophylaxis and Flagyl was added postop by the surgical team.  He had a JP drain in place but by day 2 postop, it had minimal drainage and was pulled prior to discharge.  He remained on Lovenox postoperatively and will use as bridging until his anticoagulation with Coumadin INR is 2.5 or higher.  Discharged home with his wife in good condition with minimal pain, but was eating and ambulating prior to discharge.  Day of Discharge Exam BP 108/70  Pulse  85  Temp(Src) 98 F (36.7 C) (Axillary)  Resp 16  Ht 5\' 7"  (1.702 m)  Wt 83.28 kg (183 lb 9.6 oz)  BMI 28.75 kg/m2  SpO2 93%  Physical Exam: General appearance: alert, cooperative and appears stated age  Resp: clear to auscultation bilaterally  Cardio: regular rate and rhythm, S1, S2 normal,mechanical aortic sound crisp, pacer site clean.  GI: soft,mild tenderness without rebound, trocar sites okExtremities: extremities normal, atraumatic, no cyanosis or edema  Pulses: 2+ and symmetric  Lymph nodes: Cervical adenopathy: no cervical lymphadenopathy  Neurologic: Alert and oriented X 3, Parkinsonian tremor   Discharge Labs:  Recent Labs  07/21/13 0635  07/22/13 0350  NA 139 138  K 4.5 4.0  CL 100 101  CO2 27 26  GLUCOSE 103* 109*  BUN 14 17  CREATININE 0.72 0.68  CALCIUM 9.2 8.6    Recent Labs  07/21/13 0635 07/22/13 0350  AST 21 29  ALT 20 25  ALKPHOS 78 70  BILITOT 2.1* 1.5*  PROT 6.9 6.3  ALBUMIN 3.3* 2.8*    Recent Labs  07/22/13 0350 07/23/13 0645  WBC 7.7 7.5  HGB 11.1* 10.8*  HCT 33.3* 33.5*  MCV 84.1 83.3  PLT 158 184   Lab Results  Component Value Date   INR 2.03* 07/23/2013   INR 2.23* 07/22/2013   INR 1.94* 07/21/2013    Discharge instructions:      Future Appointments Provider Department Dept Phone   08/01/2013 7:45 AM [facility] [address] Office [phone]   09/21/2013 8:00 AM [place] Spanielharles K Willis, MD [facility] [phone]   10/16/2013 10:20 AM [facility] [address] Office [phone]      Disposition: Home  Follow-up Appts: [facility], 2013 at 8:30am for transition of care visit and PT/INR check.  Shanon will call on Monday to check on him and confirm appt with he and his wife. Condition on Discharge: Improved  Tests Needing Follow-up: PT/INR  Total time of Discharge: 60 minutes  Signed: Tritia Endo W 07/23/2013, 11:09 AM

## 2013-07-25 ENCOUNTER — Telehealth (INDEPENDENT_AMBULATORY_CARE_PROVIDER_SITE_OTHER): Payer: Self-pay

## 2013-07-25 ENCOUNTER — Encounter (HOSPITAL_COMMUNITY): Payer: Self-pay | Admitting: General Surgery

## 2013-07-25 NOTE — Telephone Encounter (Signed)
Called and spoke to patient, at his request he ask that I call back and leave a message on the voice mail since he's hard of hearing.  I called and left a message with follow up appointment date & time for 07/31/13 @ 2:30pm with Dr. Johna SheriffHoxworth.  Also, advised to arrive @ 2:15 for registration.

## 2013-07-25 NOTE — Telephone Encounter (Signed)
Message copied by Maryan PulsMOORE, Truth Barot on Tue Jul 25, 2013  9:57 AM ------      Message from: Ancil BoozerKEELER, [name] B      Created: Mon Jul 24, 2013  1:13 PM      Regarding: 1st po gb follow-up       Contact: [phone]       Veverly FellsHey, Chris:      This gentleman needs his first po gb recheck around 3/20 & nothing is available until 4/10. Could you please call his wife, Britta Mccreedybarbara, and work him into Dr. Jamse MeadHoxworth's schedule.      Her numbers are [phone] or [phone].            Thanks Asher Muirbunches, [name] ------

## 2013-07-26 NOTE — Telephone Encounter (Signed)
Called patient and spoke with patient's wife Britta MccreedyBarbara to schedule an appt on the 07/28/13 with Dr. Anne HahnWillis. I advised the patient that if he has any other problems, questions or concerns to call the office. Patient's wife verbalized understanding.

## 2013-07-28 ENCOUNTER — Ambulatory Visit: Payer: Self-pay | Admitting: Neurology

## 2013-07-31 ENCOUNTER — Telehealth: Payer: Self-pay | Admitting: *Deleted

## 2013-07-31 ENCOUNTER — Ambulatory Visit (INDEPENDENT_AMBULATORY_CARE_PROVIDER_SITE_OTHER): Payer: Medicare HMO | Admitting: General Surgery

## 2013-07-31 ENCOUNTER — Encounter (INDEPENDENT_AMBULATORY_CARE_PROVIDER_SITE_OTHER): Payer: Self-pay | Admitting: General Surgery

## 2013-07-31 VITALS — BP 162/78 | HR 80 | Temp 98.6°F | Resp 14 | Ht 67.0 in | Wt 181.4 lb

## 2013-07-31 DIAGNOSIS — Z09 Encounter for follow-up examination after completed treatment for conditions other than malignant neoplasm: Secondary | ICD-10-CM

## 2013-07-31 NOTE — Progress Notes (Signed)
History: Patient returns 10 days following emergency laparoscopic cholecystectomy for severe acute cholecystitis and cholelithiasis. He has been unremarkable he well postoperatively. He has no complaints in the office today. 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 Denies abdominal pain or fever or nausea.  Exam:. BP 162/78  Pulse 80  Temp(Src) 98.6 F (37 C) (Temporal)  Resp 14  Ht 5\' 7"  (1.702 m)  Wt 181 lb 6.4 oz (82.283 kg)  BMI 28.40 kg/m2 General: Alert and appears well Abdomen: Moderate ecchymosis across his lower abdomen. Incisions healed without evidence of infection. Soft and nontender.  Assessment and plan:

## 2013-07-31 NOTE — Telephone Encounter (Signed)
Spoke with wife, [name] is asymptomatic with his A-fib.  Appointment scheduled 4/24 with Dr. Johney FrameAllred.

## 2013-08-01 ENCOUNTER — Ambulatory Visit (INDEPENDENT_AMBULATORY_CARE_PROVIDER_SITE_OTHER): Payer: Commercial Managed Care - HMO | Admitting: *Deleted

## 2013-08-01 DIAGNOSIS — Z5181 Encounter for therapeutic drug level monitoring: Secondary | ICD-10-CM

## 2013-08-01 DIAGNOSIS — I350 Nonrheumatic aortic (valve) stenosis: Secondary | ICD-10-CM

## 2013-08-01 DIAGNOSIS — I359 Nonrheumatic aortic valve disorder, unspecified: Secondary | ICD-10-CM

## 2013-08-01 LAB — POCT INR: INR: 2.3

## 2013-08-15 ENCOUNTER — Ambulatory Visit (INDEPENDENT_AMBULATORY_CARE_PROVIDER_SITE_OTHER): Payer: Commercial Managed Care - HMO | Admitting: *Deleted

## 2013-08-15 DIAGNOSIS — I350 Nonrheumatic aortic (valve) stenosis: Secondary | ICD-10-CM

## 2013-08-15 DIAGNOSIS — I359 Nonrheumatic aortic valve disorder, unspecified: Secondary | ICD-10-CM

## 2013-08-15 DIAGNOSIS — Z5181 Encounter for therapeutic drug level monitoring: Secondary | ICD-10-CM

## 2013-08-15 LAB — POCT INR: INR: 2.8

## 2013-08-24 ENCOUNTER — Encounter (INDEPENDENT_AMBULATORY_CARE_PROVIDER_SITE_OTHER): Payer: Medicare HMO | Admitting: General Surgery

## 2013-09-08 ENCOUNTER — Ambulatory Visit (INDEPENDENT_AMBULATORY_CARE_PROVIDER_SITE_OTHER): Payer: Medicare HMO | Admitting: Internal Medicine

## 2013-09-08 ENCOUNTER — Encounter: Payer: Self-pay | Admitting: Internal Medicine

## 2013-09-08 ENCOUNTER — Ambulatory Visit (INDEPENDENT_AMBULATORY_CARE_PROVIDER_SITE_OTHER): Payer: Medicare HMO | Admitting: *Deleted

## 2013-09-08 VITALS — BP 125/78 | HR 68 | Ht 71.0 in | Wt 181.0 lb

## 2013-09-08 DIAGNOSIS — I495 Sick sinus syndrome: Secondary | ICD-10-CM

## 2013-09-08 DIAGNOSIS — Z950 Presence of cardiac pacemaker: Secondary | ICD-10-CM

## 2013-09-08 DIAGNOSIS — I350 Nonrheumatic aortic (valve) stenosis: Secondary | ICD-10-CM

## 2013-09-08 DIAGNOSIS — I10 Essential (primary) hypertension: Secondary | ICD-10-CM

## 2013-09-08 DIAGNOSIS — Z952 Presence of prosthetic heart valve: Secondary | ICD-10-CM

## 2013-09-08 DIAGNOSIS — I359 Nonrheumatic aortic valve disorder, unspecified: Secondary | ICD-10-CM

## 2013-09-08 DIAGNOSIS — I4821 Permanent atrial fibrillation: Secondary | ICD-10-CM | POA: Insufficient documentation

## 2013-09-08 DIAGNOSIS — Z01812 Encounter for preprocedural laboratory examination: Secondary | ICD-10-CM

## 2013-09-08 DIAGNOSIS — Z5181 Encounter for therapeutic drug level monitoring: Secondary | ICD-10-CM

## 2013-09-08 DIAGNOSIS — I4891 Unspecified atrial fibrillation: Secondary | ICD-10-CM | POA: Insufficient documentation

## 2013-09-08 DIAGNOSIS — Z954 Presence of other heart-valve replacement: Secondary | ICD-10-CM

## 2013-09-08 LAB — CBC WITH DIFFERENTIAL/PLATELET
BASOS ABS: 0 10*3/uL (ref 0.0–0.1)
Basophils Relative: 0.4 % (ref 0.0–3.0)
EOS ABS: 0.1 10*3/uL (ref 0.0–0.7)
EOS PCT: 1.6 % (ref 0.0–5.0)
HEMATOCRIT: 39.6 % (ref 39.0–52.0)
Hemoglobin: 12.9 g/dL — ABNORMAL LOW (ref 13.0–17.0)
LYMPHS ABS: 1.4 10*3/uL (ref 0.7–4.0)
Lymphocytes Relative: 34 % (ref 12.0–46.0)
MCHC: 32.7 g/dL (ref 30.0–36.0)
MCV: 82.8 fl (ref 78.0–100.0)
Monocytes Absolute: 0.3 10*3/uL (ref 0.1–1.0)
Monocytes Relative: 8.5 % (ref 3.0–12.0)
NEUTROS PCT: 55.5 % (ref 43.0–77.0)
Neutro Abs: 2.3 10*3/uL (ref 1.4–7.7)
PLATELETS: 181 10*3/uL (ref 150.0–400.0)
RBC: 4.78 Mil/uL (ref 4.22–5.81)
RDW: 14.8 % — AB (ref 11.5–14.6)
WBC: 4.1 10*3/uL — AB (ref 4.5–10.5)

## 2013-09-08 LAB — BASIC METABOLIC PANEL
BUN: 19 mg/dL (ref 6–23)
CHLORIDE: 108 meq/L (ref 96–112)
CO2: 26 mEq/L (ref 19–32)
CREATININE: 0.7 mg/dL (ref 0.4–1.5)
Calcium: 9.5 mg/dL (ref 8.4–10.5)
GFR: 115.87 mL/min (ref >60.00)
GLUCOSE: 89 mg/dL (ref 70–99)
Potassium: 3.9 mEq/L (ref 3.5–5.1)
Sodium: 140 mEq/L (ref 135–145)

## 2013-09-08 LAB — MDC_IDC_ENUM_SESS_TYPE_INCLINIC
Battery Remaining Longevity: 130 mo
Battery Voltage: 2.78 V
Brady Statistic AP VP Percent: 1 %
Brady Statistic AP VS Percent: 48 %
Brady Statistic AS VP Percent: 0 %
Date Time Interrogation Session: 20150424100632
Lead Channel Setting Pacing Amplitude: 2 V
Lead Channel Setting Pacing Pulse Width: 0.4 ms
Lead Channel Setting Sensing Sensitivity: 5.6 mV
MDC IDC MSMT BATTERY IMPEDANCE: 136 Ohm
MDC IDC MSMT LEADCHNL RA IMPEDANCE VALUE: 473 Ohm
MDC IDC MSMT LEADCHNL RV IMPEDANCE VALUE: 423 Ohm
MDC IDC SET LEADCHNL RV PACING AMPLITUDE: 2.5 V
MDC IDC STAT BRADY AS VS PERCENT: 51 %

## 2013-09-08 LAB — POCT INR: INR: 4.5

## 2013-09-08 LAB — PROTIME-INR
INR: 4.6 ratio — ABNORMAL HIGH (ref 0.8–1.0)
PROTHROMBIN TIME: 49 s — AB (ref 9.6–13.1)

## 2013-09-08 NOTE — Addendum Note (Signed)
Addended by: Baird LyonsPRICE, Levi Crass L on: 09/08/2013 10:12 AM   Modules accepted: Orders

## 2013-09-08 NOTE — Patient Instructions (Addendum)
Your physician recommends that you continue on your current medications as directed. Please refer to the Current Medication list given to you today.  Your physician has recommended that you have a Cardioversion (DCCV). Electrical Cardioversion uses a jolt of electricity to your heart either through paddles or wired patches attached to your chest. This is a controlled, usually prescheduled, procedure. Defibrillation is done under light anesthesia in the hospital, and you usually go home the day of the procedure. This is done to get your heart back into a normal rhythm. You are not awake for the procedure. Please see the instruction sheet given to you today.  We will call you to schedule this procedure  Your physician recommends that you return for pre-procedure lab work today: BMET/CBCD/INR  Your physician recommends that you schedule a follow-up appointment in: 6 weeks with Norma FredricksonLori Gerhardt, NP.

## 2013-09-08 NOTE — Progress Notes (Signed)
PCP: Gaspar GarbeISOVEC,RICHARD W, MD Neurologist: Jenetta DownerWillis   Donald [name] is a 73 y.o. male who presents today for electrophysiology followup. Recent pacemaker interrogation revealed that Donald [name] has been in afib since early January.  Donald [name] is unaware of his afib.  Donald [name] had gall bladder surgery in early march and is recovering from this.  Donald [name] does have some fatigue.  Donald [name] was unaware of afib.  INRs have been therapeutic for more than 4 weeks.  Today, Donald [name] denies symptoms of palpitations, chest pain, shortness of breath,  lower extremity edema, dizziness, presyncope, or syncope.  The patient is otherwise without complaint today.   Past Medical History  Diagnosis Date  . Hyperlipidemia   . Hypertension   . Aortic stenosis     s/p AVR by Dr Laneta SimmersBartle  . Hyperthyroidism   . Hiatal hernia   . Right kidney stone     hx of  . Sick sinus syndrome     s/p PPM (MDT) 2006  . Atrial fibrillation     paroxysmal  . H/O: rheumatic fever   . CAD (coronary artery disease)     s/p CABG 2006  . Parkinson's disease   . Hearing deficit     Bilateral hearing aids  . Shortness of breath   . Dysrhythmia     ATRIAL FIBRILATION  . Pacemaker   . GERD (gastroesophageal reflux disease)    Past Surgical History  Procedure Laterality Date  . Insert / replace / remove pacemaker  2006, 2012    MDT for sick sinus syndrome, gen change 8/12 by JA  . Coronary artery bypass graft  2006  . Aortic valve replacement  2006  . Kidney stone surgery    . Cholecystectomy N/A 07/21/2013    Procedure: LAPAROSCOPIC CHOLECYSTECTOMY;  Surgeon: Mariella SaaBenjamin T Hoxworth, MD;  Location: [facility] OR;  Service: General;  Laterality: N/A;    Current Outpatient Prescriptions  Medication Sig Dispense Refill  . aspirin 81 MG tablet Take 81 mg by mouth daily.        Marland Kitchen. atorvastatin (LIPITOR) 20 MG tablet Take 1 tablet (20 mg total) by mouth daily.  90 tablet  1  . levothyroxine (SYNTHROID, LEVOTHROID) 125 MCG tablet Take 125 mcg by mouth daily.        . metoprolol  succinate (TOPROL-XL) 25 MG 24 hr tablet Take 25 mg by mouth daily.      . nitroGLYCERIN (NITROSTAT) 0.4 MG SL tablet Place 0.4 mg under the tongue every 5 (five) minutes as needed for chest pain.      Marland Kitchen. rOPINIRole (REQUIP) 1 MG tablet Take 1 tablet (1 mg total) by mouth 3 (three) times daily.  270 tablet  1  . trihexyphenidyl (ARTANE) 5 MG tablet Take 5 mg by mouth 3 (three) times daily with meals.      . warfarin (COUMADIN) 5 MG tablet Take 5-7.5 mg by mouth daily. Take 1 tablet on Sunday, Tuesday and Thursday then take 1 and 1/2 tablets on Monday, Wednesday, Friday and Saturday       No current facility-administered medications for this visit.    Physical Exam: Filed Vitals:   09/08/13 0916  BP: 125/78  Pulse: 68  Height: 5\' 11"  (1.803 m)  Weight: 181 lb (82.101 kg)    GEN- The patient is well appearing, alert and oriented x 3 today.   Head- normocephalic, atraumatic Eyes-  Sclera clear, conjunctiva pink Ears- hearing intact Oropharynx- clear Lungs- Clear to ausculation bilaterally, normal work of breathing Chest-  pacemaker pocket is well healed Heart- irregular rate and rhythm, mechanical S2 GI- soft, NT, ND, + BS Extremities- no clubbing, cyanosis, or edema  Pacemaker interrogation- reviewed in detail today,  See PACEART report Echo 2014 is reviewed  Assessment and Plan:  1.   Increased afib burden Donald [name] has been in persistent afib since early January.  Though symptoms are mild, I think that we should try to achieve sinus rhythm and then re-evaluate his symptoms.  If Donald [name] does not improve clinically with cardioversion then I would anticipate rate control longterm as a reasonable option.  If Donald [name] feels much better then Donald [name] may require AAD down the road. Continue coumadin  2. Sick sinus syndrome Normal pacemaker function See Pace Art report No changes today  3.  Aortic Stenosis S/p AVR 2006 2014 echo is reviewed  3.  Hypertension Stable   Cardioversion next  week Follow-up with [name] in 6 weeks to assess state post cardioversion  Carelink I will see in a year

## 2013-09-11 ENCOUNTER — Telehealth: Payer: Self-pay | Admitting: Internal Medicine

## 2013-09-11 NOTE — Telephone Encounter (Signed)
Spoke with patient's wife and he is scheduled for a DCCV.  He will need pre-certification.  Do not eat or drink after midnight and okay to take medications with a sip of water.

## 2013-09-11 NOTE — Telephone Encounter (Signed)
New Message:  Pt's wife states she is calling with questions about her husbands cardioversion. Pt is requesting to speak to a nurse.

## 2013-09-14 ENCOUNTER — Encounter (HOSPITAL_COMMUNITY): Payer: Self-pay | Admitting: *Deleted

## 2013-09-14 ENCOUNTER — Encounter (HOSPITAL_COMMUNITY)
Admission: RE | Disposition: A | Discharge status: Home / Self Care | Payer: Self-pay | Source: Ambulatory Visit | Attending: Cardiovascular Disease

## 2013-09-14 ENCOUNTER — Encounter (HOSPITAL_COMMUNITY): Payer: Medicare HMO | Admitting: Certified Registered"

## 2013-09-14 ENCOUNTER — Ambulatory Visit (HOSPITAL_COMMUNITY): Payer: Medicare HMO | Admitting: Certified Registered"

## 2013-09-14 ENCOUNTER — Ambulatory Visit (HOSPITAL_COMMUNITY)
Admission: RE | Admit: 2013-09-14 | Discharge: 2013-09-14 | Disposition: A | Discharge status: Home / Self Care | Payer: Medicare HMO | Source: Ambulatory Visit | Attending: Cardiovascular Disease | Admitting: Cardiovascular Disease

## 2013-09-14 DIAGNOSIS — G20 Parkinson's disease: Secondary | ICD-10-CM | POA: Insufficient documentation

## 2013-09-14 DIAGNOSIS — Z951 Presence of aortocoronary bypass graft: Secondary | ICD-10-CM | POA: Insufficient documentation

## 2013-09-14 DIAGNOSIS — I4891 Unspecified atrial fibrillation: Secondary | ICD-10-CM | POA: Insufficient documentation

## 2013-09-14 DIAGNOSIS — K219 Gastro-esophageal reflux disease without esophagitis: Secondary | ICD-10-CM | POA: Insufficient documentation

## 2013-09-14 DIAGNOSIS — I2510 Atherosclerotic heart disease of native coronary artery without angina pectoris: Secondary | ICD-10-CM | POA: Insufficient documentation

## 2013-09-14 DIAGNOSIS — I10 Essential (primary) hypertension: Secondary | ICD-10-CM | POA: Insufficient documentation

## 2013-09-14 DIAGNOSIS — G20A1 Parkinson's disease without dyskinesia, without mention of fluctuations: Secondary | ICD-10-CM | POA: Insufficient documentation

## 2013-09-14 DIAGNOSIS — E785 Hyperlipidemia, unspecified: Secondary | ICD-10-CM | POA: Insufficient documentation

## 2013-09-14 DIAGNOSIS — Z950 Presence of cardiac pacemaker: Secondary | ICD-10-CM | POA: Insufficient documentation

## 2013-09-14 DIAGNOSIS — E0590 Thyrotoxicosis, unspecified without thyrotoxic crisis or storm: Secondary | ICD-10-CM | POA: Insufficient documentation

## 2013-09-14 DIAGNOSIS — Z7901 Long term (current) use of anticoagulants: Secondary | ICD-10-CM | POA: Insufficient documentation

## 2013-09-14 DIAGNOSIS — H9190 Unspecified hearing loss, unspecified ear: Secondary | ICD-10-CM | POA: Insufficient documentation

## 2013-09-14 DIAGNOSIS — Z7982 Long term (current) use of aspirin: Secondary | ICD-10-CM | POA: Insufficient documentation

## 2013-09-14 DIAGNOSIS — Z954 Presence of other heart-valve replacement: Secondary | ICD-10-CM | POA: Insufficient documentation

## 2013-09-14 DIAGNOSIS — K449 Diaphragmatic hernia without obstruction or gangrene: Secondary | ICD-10-CM | POA: Insufficient documentation

## 2013-09-14 HISTORY — PX: CARDIOVERSION: SHX1299

## 2013-09-14 SURGERY — CARDIOVERSION
Anesthesia: General

## 2013-09-14 MED ORDER — PROPOFOL 10 MG/ML IV BOLUS
INTRAVENOUS | Status: DC | PRN
  Administered 2013-09-14: 80 mg via INTRAVENOUS

## 2013-09-14 MED ORDER — SODIUM CHLORIDE 0.9 % IV SOLN
INTRAVENOUS | Status: DC
  Administered 2013-09-14: 500 mL via INTRAVENOUS

## 2013-09-14 MED ORDER — LIDOCAINE HCL (CARDIAC) 20 MG/ML IV SOLN
INTRAVENOUS | Status: DC | PRN
Start: 2013-09-14 — End: 2013-09-14
  Administered 2013-09-14: 40 mg via INTRAVENOUS

## 2013-09-14 NOTE — Anesthesia Preprocedure Evaluation (Addendum)
Anesthesia Evaluation  Patient identified by MRN, date of birth, ID band Patient awake    Reviewed: Allergy & Precautions, H&P , Patient's Chart, lab work & pertinent test results  Airway Mallampati: I TM Distance: <3 FB   Mouth opening: Limited Mouth Opening  Dental  (+) Dental Advisory Given   Pulmonary shortness of breath,  breath sounds clear to auscultation        Cardiovascular hypertension, + CAD + dysrhythmias + pacemaker + Valvular Problems/Murmurs Rhythm:Irregular Rate:Normal     Neuro/Psych  Neuromuscular disease    GI/Hepatic hiatal hernia, GERD-  ,(+) Hepatitis -  Endo/Other  Hyperthyroidism   Renal/GU      Musculoskeletal   Abdominal   Peds  Hematology   Anesthesia Other Findings   Reproductive/Obstetrics                         Anesthesia Physical Anesthesia Plan  ASA: III  Anesthesia Plan: General   Post-op Pain Management:    Induction: Intravenous  Airway Management Planned: Mask  Additional Equipment:   Intra-op Plan:   Post-operative Plan:   Informed Consent: I have reviewed the patients History and Physical, chart, labs and discussed the procedure including the risks, benefits and alternatives for the proposed anesthesia with the patient or authorized representative who has indicated his/her understanding and acceptance.     Plan Discussed with: CRNA and Surgeon  Anesthesia Plan Comments:         Anesthesia Quick Evaluation

## 2013-09-14 NOTE — H&P (View-Only) (Signed)
PCP: TISOVEC,RICHARD W, MD Neurologist: Willis   Donald [name] is a 73 y.o. male who presents today for electrophysiology followup. Recent pacemaker interrogation revealed that he has been in afib since early January.  He is unaware of his afib.  He had gall bladder surgery in early march and is recovering from this.  He does have some fatigue.  He was unaware of afib.  INRs have been therapeutic for more than 4 weeks.  Today, he denies symptoms of palpitations, chest pain, shortness of breath,  lower extremity edema, dizziness, presyncope, or syncope.  The patient is otherwise without complaint today.   Past Medical History  Diagnosis Date  . Hyperlipidemia   . Hypertension   . Aortic stenosis     s/p AVR by Dr Bartle  . Hyperthyroidism   . Hiatal hernia   . Right kidney stone     hx of  . Sick sinus syndrome     s/p PPM (MDT) 2006  . Atrial fibrillation     paroxysmal  . H/O: rheumatic fever   . CAD (coronary artery disease)     s/p CABG 2006  . Parkinson's disease   . Hearing deficit     Bilateral hearing aids  . Shortness of breath   . Dysrhythmia     ATRIAL FIBRILATION  . Pacemaker   . GERD (gastroesophageal reflux disease)    Past Surgical History  Procedure Laterality Date  . Insert / replace / remove pacemaker  2006, 2012    MDT for sick sinus syndrome, gen change 8/12 by JA  . Coronary artery bypass graft  2006  . Aortic valve replacement  2006  . Kidney stone surgery    . Cholecystectomy N/A 07/21/2013    Procedure: LAPAROSCOPIC CHOLECYSTECTOMY;  Surgeon: Benjamin T Hoxworth, MD;  Location: [facility] OR;  Service: General;  Laterality: N/A;    Current Outpatient Prescriptions  Medication Sig Dispense Refill  . aspirin 81 MG tablet Take 81 mg by mouth daily.        . atorvastatin (LIPITOR) 20 MG tablet Take 1 tablet (20 mg total) by mouth daily.  90 tablet  1  . levothyroxine (SYNTHROID, LEVOTHROID) 125 MCG tablet Take 125 mcg by mouth daily.        . metoprolol  succinate (TOPROL-XL) 25 MG 24 hr tablet Take 25 mg by mouth daily.      . nitroGLYCERIN (NITROSTAT) 0.4 MG SL tablet Place 0.4 mg under the tongue every 5 (five) minutes as needed for chest pain.      . rOPINIRole (REQUIP) 1 MG tablet Take 1 tablet (1 mg total) by mouth 3 (three) times daily.  270 tablet  1  . trihexyphenidyl (ARTANE) 5 MG tablet Take 5 mg by mouth 3 (three) times daily with meals.      . warfarin (COUMADIN) 5 MG tablet Take 5-7.5 mg by mouth daily. Take 1 tablet on Sunday, Tuesday and Thursday then take 1 and 1/2 tablets on Monday, Wednesday, Friday and Saturday       No current facility-administered medications for this visit.    Physical Exam: Filed Vitals:   09/08/13 0916  BP: 125/78  Pulse: 68  Height: 5' 11" (1.803 m)  Weight: 181 lb (82.101 kg)    GEN- The patient is well appearing, alert and oriented x 3 today.   Head- normocephalic, atraumatic Eyes-  Sclera clear, conjunctiva pink Ears- hearing intact Oropharynx- clear Lungs- Clear to ausculation bilaterally, normal work of breathing Chest-   pacemaker pocket is well healed Heart- irregular rate and rhythm, mechanical S2 GI- soft, NT, ND, + BS Extremities- no clubbing, cyanosis, or edema  Pacemaker interrogation- reviewed in detail today,  See PACEART report Echo 2014 is reviewed  Assessment and Plan:  1.   Increased afib burden He has been in persistent afib since early January.  Though symptoms are mild, I think that we should try to achieve sinus rhythm and then re-evaluate his symptoms.  If he does not improve clinically with cardioversion then I would anticipate rate control longterm as a reasonable option.  If he feels much better then he may require AAD down the road. Continue coumadin  2. Sick sinus syndrome Normal pacemaker function See Pace Art report No changes today  3.  Aortic Stenosis S/p AVR 2006 2014 echo is reviewed  3.  Hypertension Stable   Cardioversion next  week Follow-up with [name] in 6 weeks to assess state post cardioversion  [facility] I will see in a year

## 2013-09-14 NOTE — Anesthesia Postprocedure Evaluation (Signed)
  Anesthesia Post-op Note  Patient: Donald [name]  Procedure(s) Performed: Procedure(s): CARDIOVERSION (N/A)  Patient Location: PACU and Endoscopy Unit  Anesthesia Type:MAC  Level of Consciousness: awake  Airway and Oxygen Therapy: Patient Spontanous Breathing and Patient connected to nasal cannula oxygen  Post-op Pain: none  Post-op Assessment: Post-op Vital signs reviewed and Patient's Cardiovascular Status Stable  Post-op Vital Signs: Reviewed and stable  Last Vitals:  Filed Vitals:   09/14/13 0954  BP: 138/74  Pulse:   Temp:   Resp: 19    Complications: No apparent anesthesia complications

## 2013-09-14 NOTE — Transfer of Care (Signed)
Immediate Anesthesia Transfer of Care Note  Patient: Wonda HornerFrank R Scholze  Procedure(s) Performed: Procedure(s): CARDIOVERSION (N/A)  Patient Location: PACU and Endoscopy Unit  Anesthesia Type:MAC  Level of Consciousness: sedated  Airway & Oxygen Therapy: Patient Spontanous Breathing and Patient connected to nasal cannula oxygen  Post-op Assessment: Report given to PACU RN and Post -op Vital signs reviewed and stable  Post vital signs: Reviewed and stable  Complications: No apparent anesthesia complications

## 2013-09-14 NOTE — Op Note (Signed)
Procedure: Electrical Cardioversion Indications:  Atrial Fibrillation  Procedure Details:  Consent: Risks of procedure as well as the alternatives and risks of each were explained to the (patient/caregiver).  Consent for procedure obtained.  Time Out: Verified patient identification, verified procedure, site/side was marked, verified correct patient position, special equipment/implants available, medications/allergies/relevent history reviewed, required imaging and test results available.  Performed  Patient placed on cardiac monitor, pulse oximetry, supplemental oxygen as necessary.  Sedation given: Propofol 80 mg IV Dr. Jacklynn BueMassagee Pacer pads placed anterior and posterior chest.  Cardioverted 1 time(s).  Cardioverted at 120J.  Evaluation: Findings: Post procedure EKG shows: Atrial paced-ventricular sensed with long AV delay Complications: None Patient did tolerate procedure well.  Post procedure pacemaker check shows normal function. Post mode switch overdrive pacing and atrial preference pacing turned "on".  Time Spent Directly with the Patient:  45 minutes   Donald [name] 09/14/2013, 10:13 AM

## 2013-09-14 NOTE — Discharge Instructions (Signed)
Electrical Cardioversion, Care After °Refer to this sheet in the next few weeks. These instructions provide you with information on caring for yourself after your procedure. Your health care provider may also give you more specific instructions. Your treatment has been planned according to current medical practices, but problems sometimes occur. Call your health care provider if you have any problems or questions after your procedure. °WHAT TO EXPECT AFTER THE PROCEDURE °After your procedure, it is typical to have the following sensations: °· Some redness on the skin where the shocks were delivered. If this is tender, a sunburn lotion or hydrocortisone cream may help. °· Possible return of an abnormal heart rhythm within hours or days after the procedure. °HOME CARE INSTRUCTIONS °· Only take medicine as directed by your health care provider. Be sure you understand how and when to take your medicine. °· Learn how to feel your pulse and check it often. °· Limit your activity for 48 hours after the procedure or as directed. °· Avoid or minimize caffeine and other stimulants as directed. °SEEK MEDICAL CARE IF: °· You feel like your heart is beating too fast or your pulse is not regular. °· You have any questions about your medicines. °· You have bleeding that will not stop. °SEEK IMMEDIATE MEDICAL CARE IF: °· You are dizzy or feel faint. °· It is hard to breathe or you feel short of breath. °· There is a change in discomfort in your chest. °· Your speech is slurred or you have trouble moving an arm or leg on one side of your body. °· You get a serious muscle cramp that does not go away. °· Your fingers or toes turn cold or blue. °MAKE SURE YOU:  °· Understand these instructions.   °· Will watch your condition.   °· Will get help right away if you are not doing well or get worse. °Document Released: 02/22/2013 Document Reviewed: 11/16/2012 °ExitCare® Patient Information ©2014 [employer]. ° °

## 2013-09-14 NOTE — Interval H&P Note (Signed)
History and Physical Interval Note:  09/14/2013 9:22 AM  Donald [name]  has presented today for surgery, with the diagnosis of afib  The various methods of treatment have been discussed with the patient and family. After consideration of risks, benefits and other options for treatment, the patient has consented to  Procedure(s): CARDIOVERSION (N/A) as a surgical intervention .  The patient's history has been reviewed, patient examined, no change in status, stable for surgery.  I have reviewed the patient's chart and labs.  Questions were answered to the patient's satisfaction.     [name]

## 2013-09-15 ENCOUNTER — Encounter (HOSPITAL_COMMUNITY): Payer: Self-pay | Admitting: Cardiovascular Disease

## 2013-09-21 ENCOUNTER — Ambulatory Visit (INDEPENDENT_AMBULATORY_CARE_PROVIDER_SITE_OTHER): Payer: Commercial Managed Care - HMO | Admitting: Neurology

## 2013-09-21 ENCOUNTER — Encounter: Payer: Self-pay | Admitting: Neurology

## 2013-09-21 VITALS — BP 142/87 | HR 67 | Temp 97.8°F | Ht 68.0 in | Wt 180.0 lb

## 2013-09-21 DIAGNOSIS — G20 Parkinson's disease: Secondary | ICD-10-CM

## 2013-09-21 MED ORDER — TRIHEXYPHENIDYL HCL 5 MG PO TABS: 5.0000 mg | ORAL_TABLET | Freq: Two times a day (BID) | ORAL | Status: DC

## 2013-09-21 MED ORDER — ROPINIROLE HCL 1 MG PO TABS: 1.0000 mg | ORAL_TABLET | Freq: Three times a day (TID) | ORAL | Status: DC

## 2013-09-21 NOTE — Progress Notes (Addendum)
PATIENT: Donald HornerFrank R Langille DOB: February 14, 1941  REASON FOR VISIT: follow up HISTORY FROM: patient  HISTORY OF PRESENT ILLNESS: [name] is a 73 year old left-handed white male with a history of Parkinson's disease primarily with right-sided features. He returns today for following. The tremor is still very prominent. The patient tried to wean off the Artane but tremor became worse. The patient was able to decrease it from 3 times a day to 2 times a day. His wife reports that his memory improve a little with the decrease in the dose. The patient continues to stay active. He walks daily. He denies any falls. He denies any trouble swallowing. Since the last visit patient had his gall bladder removed. He also had a cardioversion for atrial fibrillation.   REVIEW OF SYSTEMS: Full 14 system review of systems performed and notable only for:  Constitutional: N/A  Eyes: N/A Ear/Nose/Throat: hearing loss, drooling Skin: N/A  Cardiovascular: murmur  Respiratory: N/A  Gastrointestinal: N/A  Genitourinary: N/A Hematology/Lymphatic: bruise/bleed easily Endocrine: N/A Musculoskeletal:N/A  Allergy/Immunology: N/A  Neurological: tremors Psychiatric: N/A Sleep: snoring   ALLERGIES: No Known Allergies  HOME MEDICATIONS: Outpatient Prescriptions Prior to Visit  Medication Sig Dispense Refill  . aspirin 81 MG tablet Take 81 mg by mouth daily.        Marland Kitchen. atorvastatin (LIPITOR) 20 MG tablet Take 1 tablet (20 mg total) by mouth daily.  90 tablet  1  . levothyroxine (SYNTHROID, LEVOTHROID) 125 MCG tablet Take 125 mcg by mouth daily.        . metoprolol succinate (TOPROL-XL) 25 MG 24 hr tablet Take 25 mg by mouth daily.      . nitroGLYCERIN (NITROSTAT) 0.4 MG SL tablet Place 0.4 mg under the tongue every 5 (five) minutes as needed for chest pain.      Marland Kitchen. rOPINIRole (REQUIP) 1 MG tablet Take 1 tablet (1 mg total) by mouth 3 (three) times daily.  270 tablet  1  . trihexyphenidyl (ARTANE) 5 MG tablet Take 5  mg by mouth 3 (three) times daily with meals.      . warfarin (COUMADIN) 5 MG tablet Take 5-7.5 mg by mouth daily. Take 1 tablet on Sunday, Tuesday and Thursday then take 1 and 1/2 tablets on Monday, Wednesday, Friday and Saturday       No facility-administered medications prior to visit.    PAST MEDICAL HISTORY: Past Medical History  Diagnosis Date  . Hyperlipidemia   . Hypertension   . Aortic stenosis     s/p AVR by Dr Laneta SimmersBartle  . Hyperthyroidism   . Hiatal hernia   . Right kidney stone     hx of  . Sick sinus syndrome     s/p PPM (MDT) 2006  . Atrial fibrillation     paroxysmal  . H/O: rheumatic fever   . CAD (coronary artery disease)     s/p CABG 2006  . Parkinson's disease   . Hearing deficit     Bilateral hearing aids  . Shortness of breath   . Dysrhythmia     ATRIAL FIBRILATION  . Pacemaker   . GERD (gastroesophageal reflux disease)     PAST SURGICAL HISTORY: Past Surgical History  Procedure Laterality Date  . Insert / replace / remove pacemaker  2006, 2012    MDT for sick sinus syndrome, gen change 8/12 by JA  . Coronary artery bypass graft  2006  . Aortic valve replacement  2006  . Kidney stone surgery    .  Cholecystectomy N/A 07/21/2013    Procedure: LAPAROSCOPIC CHOLECYSTECTOMY;  Surgeon: Mariella Saa, MD;  Location: [facility] OR;  Service: General;  Laterality: N/A;  . Cardioversion N/A 09/14/2013    Procedure: CARDIOVERSION;  Surgeon: Thurmon Fair, MD;  Location: [facility] ENDOSCOPY;  Service: Cardiovascular;  Laterality: N/A;  . Cardioversion  09-14-13  . Cholescystectomy  09-14-13    FAMILY HISTORY: Family History  Problem Relation Age of Onset  . Coronary artery disease      family hx of  . Dementia Mother   . Parkinsonism Mother   . Heart disease Father   . Heart disease Sister   . Heart disease Brother   . Tremor Brother     Unilateral tremor    SOCIAL HISTORY: History   Social History  . Marital Status: Married    Spouse Name: barbars     Number of Children: 2  . Years of Education: HS   Occupational History  . Not on file.   Social History Main Topics  . Smoking status: Never Smoker   . Smokeless tobacco: Never Used  . Alcohol Use: No  . Drug Use: No  . Sexual Activity: Yes   Other Topics Concern  . Not on file   Social History Narrative  . No narrative on file    PHYSICAL EXAM  Filed Vitals:   09/21/13 0802  BP: 142/87  Pulse: 67  Temp: 97.8 F (36.6 C)  TempSrc: Oral  Height: 5\' 8"  (1.727 m)  Weight: 180 lb (81.647 kg)   Body mass index is 27.38 kg/(m^2).  Generalized: Well developed, in no acute distress   Neurological examination  Mentation: Alert oriented to time, place, history taking. Follows all commands speech and language fluent. Masking of face. MMSE 28/30. Cranial nerve II-XII:  Extraocular movements were full, visual field were full on confrontational test.  Motor: The motor testing reveals 5 over 5 strength of all 4 extremities. Good symmetric motor tone is noted throughout. Rigidity with ROM exercises noted in the right arm Sensory: Sensory testing is intact to soft touch on all 4 extremities. No evidence of extinction is noted.  Coordination: Cerebellar testing reveals good finger-nose-finger and heel-to-shin bilaterally.  Gait and station: Able to stand from a sitting position with arms crossed. Gait is normal. Decreased arm swing, with tremor in the right arm while ambulating. Tandem gait is normal. Romberg is negative. No drift is seen.  Reflexes: Deep tendon reflexes are symmetric and normal bilaterally.    DIAGNOSTIC DATA (LABS, IMAGING, TESTING) - I reviewed patient records, labs, notes, testing and imaging myself where available.  Lab Results  Component Value Date   WBC 4.1* 09/08/2013   HGB 12.9* 09/08/2013   HCT 39.6 09/08/2013   MCV 82.8 09/08/2013   PLT 181.0 09/08/2013      Component Value Date/Time   NA 140 09/08/2013 1008   K 3.9 09/08/2013 1008   CL 108 09/08/2013  1008   CO2 26 09/08/2013 1008   GLUCOSE 89 09/08/2013 1008   BUN 19 09/08/2013 1008   CREATININE 0.7 09/08/2013 1008   CALCIUM 9.5 09/08/2013 1008   PROT 6.3 07/22/2013 0350   ALBUMIN 2.8* 07/22/2013 0350   AST 29 07/22/2013 0350   ALT 25 07/22/2013 0350   ALKPHOS 70 07/22/2013 0350   BILITOT 1.5* 07/22/2013 0350   GFRNONAA >90 07/22/2013 0350   GFRAA >90 07/22/2013 0350    ASSESSMENT AND PLAN 73 y.o. year old male  has a past medical history  of Hyperlipidemia; Hypertension; Aortic stenosis; Hyperthyroidism; Hiatal hernia; Right kidney stone; Sick sinus syndrome; Atrial fibrillation; H/O: rheumatic fever; CAD (coronary artery disease); Parkinson's disease; Hearing deficit; Shortness of breath; Dysrhythmia; Pacemaker; and GERD (gastroesophageal reflux disease). here with  1. Parkinson disease  The patient was not able to wean off the artane as the tremor got worse. He was able to reduce the dose to twice a day and his memory improved a little. At this point patient is not interested in a deep brain stimulator.  Continue to remain active and exercise daily.  Continue Artane 5 mg BID Continue Requip 1 mg TID  Follow-up in 6 months or sooner if needed.    Butch PennyMegan Deven Furia, MSN, NP-C 09/21/2013, 8:07 AM [facility] [address], [address] [place], [place] [address] [phone]  Note: This document was prepared with digital dictation and possible smart phrase technology. Any transcriptional errors that result from this process are unintentional. York Spanielharles K Willis

## 2013-09-21 NOTE — Patient Instructions (Signed)
Parkinson Disease Parkinson disease is a disorder of the brain and spinal cord (central nervous system). The person will slowly lose the ability to control his or her body movements. This happens due to:  Damaged nerve cells.  Low levels of a certain brain chemical. HOME CARE  Exercise often.  Make time to rest during the day.  Take all medicine as told by your doctor.  Replace buttons and zippers with elastic and Velcro if getting dressed is difficult.  Put grab bars or rails in your home. This helps you to not fall.  Go to speech therapy or therapy to help you with daily activities (occupational therapy). Do this as told by your doctor.  Keep all doctor visits as told. GET HELP IF:  Your medicine does not help your symptoms.  You fall.  You have trouble swallowing or choke on your food. MAKE SURE YOU:  Understand these instructions.  Will watch your condition.  Will get help right away if you are not doing well or get worse. Document Released: 07/27/2011 Document Revised: 08/29/2012 Document Reviewed: 07/27/2011 ExitCare Patient Information 2014 [employer].  

## 2013-09-22 ENCOUNTER — Telehealth: Payer: Self-pay | Admitting: Nurse Practitioner

## 2013-09-22 ENCOUNTER — Ambulatory Visit (INDEPENDENT_AMBULATORY_CARE_PROVIDER_SITE_OTHER): Payer: Medicare HMO

## 2013-09-22 DIAGNOSIS — I359 Nonrheumatic aortic valve disorder, unspecified: Secondary | ICD-10-CM

## 2013-09-22 DIAGNOSIS — I350 Nonrheumatic aortic (valve) stenosis: Secondary | ICD-10-CM

## 2013-09-22 DIAGNOSIS — Z5181 Encounter for therapeutic drug level monitoring: Secondary | ICD-10-CM

## 2013-09-22 LAB — POCT INR: INR: 3.1

## 2013-09-22 NOTE — Telephone Encounter (Signed)
Patients wife has questions about his coumadin level. Please call and advise.

## 2013-09-25 NOTE — Telephone Encounter (Signed)
Spoke with pt's wife pt has been taking 7.5mg  MWFSa/5mg  daily.  Advised pt was in range last visit so to have pt continue on same dosage.

## 2013-10-06 ENCOUNTER — Ambulatory Visit (INDEPENDENT_AMBULATORY_CARE_PROVIDER_SITE_OTHER): Payer: Commercial Managed Care - HMO | Admitting: *Deleted

## 2013-10-06 DIAGNOSIS — I359 Nonrheumatic aortic valve disorder, unspecified: Secondary | ICD-10-CM

## 2013-10-06 DIAGNOSIS — I350 Nonrheumatic aortic (valve) stenosis: Secondary | ICD-10-CM

## 2013-10-06 DIAGNOSIS — Z5181 Encounter for therapeutic drug level monitoring: Secondary | ICD-10-CM

## 2013-10-06 LAB — POCT INR: INR: 3

## 2013-10-16 ENCOUNTER — Ambulatory Visit (INDEPENDENT_AMBULATORY_CARE_PROVIDER_SITE_OTHER): Payer: Commercial Managed Care - HMO | Admitting: *Deleted

## 2013-10-16 ENCOUNTER — Encounter: Payer: Self-pay | Admitting: Internal Medicine

## 2013-10-16 ENCOUNTER — Other Ambulatory Visit: Payer: Self-pay

## 2013-10-16 DIAGNOSIS — I495 Sick sinus syndrome: Secondary | ICD-10-CM

## 2013-10-16 DIAGNOSIS — I4891 Unspecified atrial fibrillation: Secondary | ICD-10-CM | POA: Diagnosis not present

## 2013-10-16 LAB — MDC_IDC_ENUM_SESS_TYPE_REMOTE
Battery Voltage: 2.78 V
Brady Statistic AP VP Percent: 33 %
Brady Statistic AP VS Percent: 58 %
Brady Statistic AS VP Percent: 2 %
Brady Statistic AS VS Percent: 7 %
Date Time Interrogation Session: 20150601094431
Lead Channel Impedance Value: 425 Ohm
Lead Channel Pacing Threshold Amplitude: 0.625 V
Lead Channel Pacing Threshold Amplitude: 0.625 V
Lead Channel Pacing Threshold Pulse Width: 0.4 ms
Lead Channel Pacing Threshold Pulse Width: 0.4 ms
Lead Channel Setting Pacing Pulse Width: 0.4 ms
MDC IDC MSMT BATTERY IMPEDANCE: 161 Ohm
MDC IDC MSMT BATTERY REMAINING LONGEVITY: 119 mo
MDC IDC MSMT LEADCHNL RA IMPEDANCE VALUE: 515 Ohm
MDC IDC MSMT LEADCHNL RV SENSING INTR AMPL: 8 mV
MDC IDC SET LEADCHNL RA PACING AMPLITUDE: 2 V
MDC IDC SET LEADCHNL RV PACING AMPLITUDE: 2.5 V
MDC IDC SET LEADCHNL RV SENSING SENSITIVITY: 4 mV

## 2013-10-16 MED ORDER — TRIHEXYPHENIDYL HCL 5 MG PO TABS: 5.0000 mg | ORAL_TABLET | Freq: Two times a day (BID) | ORAL | Status: DC

## 2013-10-16 NOTE — Progress Notes (Signed)
Remote pacemaker transmission.   

## 2013-10-17 ENCOUNTER — Telehealth: Payer: Self-pay | Admitting: *Deleted

## 2013-10-17 NOTE — Telephone Encounter (Signed)
Rx was sent yesterday.  It usually takes them 24-48 hours to enter the info in their system once it has been sent.

## 2013-10-24 ENCOUNTER — Ambulatory Visit (INDEPENDENT_AMBULATORY_CARE_PROVIDER_SITE_OTHER): Payer: Commercial Managed Care - HMO | Admitting: Nurse Practitioner

## 2013-10-24 ENCOUNTER — Encounter: Payer: Self-pay | Admitting: Nurse Practitioner

## 2013-10-24 ENCOUNTER — Ambulatory Visit (INDEPENDENT_AMBULATORY_CARE_PROVIDER_SITE_OTHER): Payer: Commercial Managed Care - HMO | Admitting: Pharmacist

## 2013-10-24 VITALS — BP 130/80 | HR 61 | Ht 71.0 in | Wt 176.8 lb

## 2013-10-24 DIAGNOSIS — Z5181 Encounter for therapeutic drug level monitoring: Secondary | ICD-10-CM

## 2013-10-24 DIAGNOSIS — I359 Nonrheumatic aortic valve disorder, unspecified: Secondary | ICD-10-CM

## 2013-10-24 DIAGNOSIS — I4891 Unspecified atrial fibrillation: Secondary | ICD-10-CM | POA: Diagnosis not present

## 2013-10-24 DIAGNOSIS — Z950 Presence of cardiac pacemaker: Secondary | ICD-10-CM

## 2013-10-24 DIAGNOSIS — I350 Nonrheumatic aortic (valve) stenosis: Secondary | ICD-10-CM

## 2013-10-24 LAB — POCT INR: INR: 2.7

## 2013-10-24 NOTE — Patient Instructions (Signed)
Stay on your current medicines  See Dr. Johney Frame back as planned  We will check your Coumadin level today  Call the [facility] office at [phone] if you have any questions, problems or concerns.

## 2013-10-24 NOTE — Progress Notes (Signed)
Donald [name] Date of Birth: 1941/05/04 Medical Record #[id number]  History of Present Illness: Donald [name] comes in today for a six-week followup visit.  He is for Dr. Johney FrameAllred. He has hypertension, hyperlipidemia, aortic stenosis with prior aortic valvel replacement in 2006, hyperthyroidism, sick sinus syndrome with pacemaker implant in 2006, paroxysmal atrial fibrillation, history of rheumatic fever, coronary disease with previous bypass surgery in 2006, Parkinson's, and GERD.   He was seen here 6 weeks. He had had a pacemaker interrogation in January which showed that he was atrial fibrillation. He was unaware and with mild symptoms at best. He was referred for cardioversion.  He comes in today. He is here alone. He feels good. Very active. Walking 3 miles each day - cutting grass - fixing cars, etc. Very happy with how he is doing. No chest pain, SOB, palpitations. Had a remote pacer check earlier this month - 28% AF noted.  Current Outpatient Prescriptions  Medication Sig Dispense Refill  . aspirin 81 MG tablet Take 81 mg by mouth daily.        Marland Kitchen. atorvastatin (LIPITOR) 20 MG tablet Take 1 tablet (20 mg total) by mouth daily.  90 tablet  1  . levothyroxine (SYNTHROID, LEVOTHROID) 125 MCG tablet Take 125 mcg by mouth daily.        . metoprolol succinate (TOPROL-XL) 25 MG 24 hr tablet Take 25 mg by mouth daily.      . nitroGLYCERIN (NITROSTAT) 0.4 MG SL tablet Place 0.4 mg under the tongue every 5 (five) minutes as needed for chest pain.      Marland Kitchen. rOPINIRole (REQUIP) 1 MG tablet Take 1 tablet (1 mg total) by mouth 3 (three) times daily.  270 tablet  1  . trihexyphenidyl (ARTANE) 5 MG tablet Take 1 tablet (5 mg total) by mouth 2 (two) times daily with a meal.  180 tablet  2  . warfarin (COUMADIN) 5 MG tablet Take 5-7.5 mg by mouth daily. Take 1 tablet on Sunday, Tuesday and Thursday then take 1 and 1/2 tablets on Monday, Wednesday, Friday and Saturday       No current facility-administered  medications for this visit.    No Known Allergies  Past Medical History  Diagnosis Date  . Hyperlipidemia   . Hypertension   . Aortic stenosis     s/p AVR by Dr Laneta SimmersBartle  . Hyperthyroidism   . Hiatal hernia   . Right kidney stone     hx of  . Sick sinus syndrome     s/p PPM (MDT) 2006  . Atrial fibrillation     paroxysmal  . H/O: rheumatic fever   . CAD (coronary artery disease)     s/p CABG 2006  . Parkinson's disease   . Hearing deficit     Bilateral hearing aids  . Shortness of breath   . Dysrhythmia     ATRIAL FIBRILATION  . Pacemaker   . GERD (gastroesophageal reflux disease)     Past Surgical History  Procedure Laterality Date  . Insert / replace / remove pacemaker  2006, 2012    MDT for sick sinus syndrome, gen change 8/12 by JA  . Coronary artery bypass graft  2006  . Aortic valve replacement  2006  . Kidney stone surgery    . Cholecystectomy N/A 07/21/2013    Procedure: LAPAROSCOPIC CHOLECYSTECTOMY;  Surgeon: Mariella SaaBenjamin T Hoxworth, MD;  Location: [facility];  Service: General;  Laterality: N/A;  . Cardioversion N/A 09/14/2013  Procedure: CARDIOVERSION;  Surgeon: Thurmon Fair, MD;  Location: [facility];  Service: Cardiovascular;  Laterality: N/A;  . Cardioversion  09-14-13  . Cholescystectomy  09-14-13    History  Smoking status  . Never Smoker   Smokeless tobacco  . Never Used    History  Alcohol Use No    Family History  Problem Relation Age of Onset  . Coronary artery disease      family hx of  . Dementia Mother   . Parkinsonism Mother   . Heart disease Father   . Heart disease Sister   . Heart disease Brother   . Tremor Brother     Unilateral tremor    Review of Systems: The review of systems is per the HPI.  All other systems were reviewed and are negative.  Physical Exam: BP 130/80  Pulse 61  Ht 5\' 11"  (1.803 m)  Wt 176 lb 12.8 oz (80.196 kg)  BMI 24.67 kg/m2 Patient is very pleasant and in no acute distress. Skin is warm and dry.  Color is normal.  HEENT is unremarkable. Normocephalic/atraumatic. PERRL. Sclera are nonicteric. Neck is supple. No masses. No JVD. Lungs are clear. Cardiac exam shows a fairly regular rate and rhythm. Valve crisp.  Abdomen is soft. Extremities are without edema. Gait and ROM are intact. No gross neurologic deficits noted.  LABORATORY DATA: Lab Results  Component Value Date   WBC 4.1* 09/08/2013   HGB 12.9* 09/08/2013   HCT 39.6 09/08/2013   PLT 181.0 09/08/2013   GLUCOSE 89 09/08/2013   ALT 25 07/22/2013   AST 29 07/22/2013   NA 140 09/08/2013   K 3.9 09/08/2013   CL 108 09/08/2013   CREATININE 0.7 09/08/2013   BUN 19 09/08/2013   CO2 26 09/08/2013   INR 3.0 10/06/2013   Procedure: Electrical Cardioversion  Indications: Atrial Fibrillation  Procedure Details:  Consent: Risks of procedure as well as the alternatives and risks of each were explained to the (patient/caregiver). Consent for procedure obtained.  Time Out: Verified patient identification, verified procedure, site/side was marked, verified correct patient position, special equipment/implants available, medications/allergies/relevent history reviewed, required imaging and test results available. Performed  Patient placed on cardiac monitor, pulse oximetry, supplemental oxygen as necessary.  Sedation given: Propofol 80 mg IV Dr. Jacklynn Bue  Pacer pads placed anterior and posterior chest.  Cardioverted 1 time(s).  Cardioverted at 120J.  Evaluation:  Findings: Post procedure EKG shows: Atrial paced-ventricular sensed with long AV delay  Complications: None  Patient did tolerate procedure well.  Post procedure pacemaker check shows normal function. Post mode switch overdrive pacing and atrial preference pacing turned "on".  Time Spent Directly with the Patient:  45 minutes  Mihai Croitoru  09/14/2013, 10:13 AM    Echo Study Conclusions from December 2014  - Left ventricle: The cavity size was normal. Wall thickness was increased in a  pattern of mild LVH. Systolic function was normal. The estimated ejection fraction was in the range of 55% to 60%. Wall motion was normal; there were no regional wall motion abnormalities. Doppler parameters are consistent with abnormal left ventricular relaxation (grade 1 diastolic dysfunction). - Aortic valve: A mechanical prosthesis was present. - Mitral valve: Calcified annulus. Mildly thickened leaflets . - Left atrium: The atrium was mildly dilated. - Right ventricle: The cavity size was mildly dilated. - Right atrium: The atrium was mildly dilated. Impressions:  - Mechanical aortic valve with mildly elevated mean gradient of 25 mmHg; no old study for comparison.  Assessment / Plan:  #1. Atrial fibrillation status post recent cardioversion - EKG hard to discern - pacemaker is checked today - he has had some AF but in sinus today - nothing sustained - he is totally asymptomatic - I have left him on his current regimen - see back as planned.  #2. CAD with remote coronary artery bypass grafting - no symptoms  #3. History of aortic valve placement  #4. Chronic anti-coagulation - checking today  #5. Underlying pacemaker - followed by Dr. Johney Frame     Patient is agreeable to this plan and will call if any problems develop in the interim.   Rosalio Macadamia, RN, ANP-C [facility] [address] [place], [place]  [address] [phone]

## 2013-10-27 ENCOUNTER — Encounter: Payer: Self-pay | Admitting: Cardiology

## 2013-11-21 ENCOUNTER — Ambulatory Visit (INDEPENDENT_AMBULATORY_CARE_PROVIDER_SITE_OTHER): Payer: Commercial Managed Care - HMO | Admitting: *Deleted

## 2013-11-21 DIAGNOSIS — Z5181 Encounter for therapeutic drug level monitoring: Secondary | ICD-10-CM

## 2013-11-21 DIAGNOSIS — I359 Nonrheumatic aortic valve disorder, unspecified: Secondary | ICD-10-CM

## 2013-11-21 DIAGNOSIS — I350 Nonrheumatic aortic (valve) stenosis: Secondary | ICD-10-CM

## 2013-11-21 LAB — POCT INR: INR: 2.5

## 2013-11-28 ENCOUNTER — Other Ambulatory Visit: Payer: Self-pay | Admitting: Cardiovascular Disease

## 2013-11-28 ENCOUNTER — Other Ambulatory Visit: Payer: Self-pay

## 2013-11-28 ENCOUNTER — Other Ambulatory Visit: Payer: Self-pay | Admitting: Internal Medicine

## 2013-11-28 MED ORDER — ROPINIROLE HCL 1 MG PO TABS: 1.0000 mg | ORAL_TABLET | Freq: Three times a day (TID) | ORAL | Status: DC

## 2013-12-26 ENCOUNTER — Ambulatory Visit (INDEPENDENT_AMBULATORY_CARE_PROVIDER_SITE_OTHER): Payer: Commercial Managed Care - HMO | Admitting: *Deleted

## 2013-12-26 DIAGNOSIS — Z5181 Encounter for therapeutic drug level monitoring: Secondary | ICD-10-CM

## 2013-12-26 DIAGNOSIS — I359 Nonrheumatic aortic valve disorder, unspecified: Secondary | ICD-10-CM

## 2013-12-26 DIAGNOSIS — I350 Nonrheumatic aortic (valve) stenosis: Secondary | ICD-10-CM

## 2013-12-26 LAB — POCT INR: INR: 2.8

## 2014-01-17 ENCOUNTER — Ambulatory Visit (INDEPENDENT_AMBULATORY_CARE_PROVIDER_SITE_OTHER): Payer: Commercial Managed Care - HMO | Admitting: *Deleted

## 2014-01-17 DIAGNOSIS — I495 Sick sinus syndrome: Secondary | ICD-10-CM

## 2014-01-17 NOTE — Progress Notes (Signed)
Remote pacemaker transmission.   

## 2014-01-19 LAB — MDC_IDC_ENUM_SESS_TYPE_REMOTE
Battery Impedance: 185 Ohm
Battery Remaining Longevity: 122 mo
Battery Voltage: 2.79 V
Brady Statistic AP VP Percent: 3 %
Brady Statistic AS VP Percent: 1 %
Date Time Interrogation Session: 20150902104334
Lead Channel Setting Pacing Amplitude: 2 V
Lead Channel Setting Pacing Amplitude: 2.5 V
Lead Channel Setting Sensing Sensitivity: 5.6 mV
MDC IDC MSMT LEADCHNL RA IMPEDANCE VALUE: 546 Ohm
MDC IDC MSMT LEADCHNL RV IMPEDANCE VALUE: 443 Ohm
MDC IDC SET LEADCHNL RV PACING PULSEWIDTH: 0.4 ms
MDC IDC STAT BRADY AP VS PERCENT: 86 %
MDC IDC STAT BRADY AS VS PERCENT: 9 %

## 2014-01-25 ENCOUNTER — Encounter: Payer: Self-pay | Admitting: Cardiology

## 2014-02-06 ENCOUNTER — Ambulatory Visit (INDEPENDENT_AMBULATORY_CARE_PROVIDER_SITE_OTHER): Payer: Commercial Managed Care - HMO | Admitting: *Deleted

## 2014-02-06 DIAGNOSIS — I350 Nonrheumatic aortic (valve) stenosis: Secondary | ICD-10-CM

## 2014-02-06 DIAGNOSIS — Z5181 Encounter for therapeutic drug level monitoring: Secondary | ICD-10-CM

## 2014-02-06 DIAGNOSIS — I359 Nonrheumatic aortic valve disorder, unspecified: Secondary | ICD-10-CM

## 2014-02-06 LAB — POCT INR: INR: 3

## 2014-02-07 ENCOUNTER — Encounter: Payer: Self-pay | Admitting: Internal Medicine

## 2014-02-17 ENCOUNTER — Other Ambulatory Visit: Payer: Self-pay | Admitting: Cardiovascular Disease

## 2014-02-17 ENCOUNTER — Other Ambulatory Visit: Payer: Self-pay | Admitting: Internal Medicine

## 2014-02-19 ENCOUNTER — Other Ambulatory Visit: Payer: Self-pay

## 2014-02-19 MED ORDER — ROPINIROLE HCL 1 MG PO TABS: 1.0000 mg | ORAL_TABLET | Freq: Three times a day (TID) | ORAL | Status: DC

## 2014-03-22 ENCOUNTER — Other Ambulatory Visit: Payer: Self-pay | Admitting: Cardiovascular Disease

## 2014-03-22 ENCOUNTER — Other Ambulatory Visit: Payer: Self-pay | Admitting: Internal Medicine

## 2014-03-23 ENCOUNTER — Ambulatory Visit (INDEPENDENT_AMBULATORY_CARE_PROVIDER_SITE_OTHER): Payer: Commercial Managed Care - HMO | Admitting: *Deleted

## 2014-03-23 DIAGNOSIS — Z5181 Encounter for therapeutic drug level monitoring: Secondary | ICD-10-CM

## 2014-03-23 DIAGNOSIS — I359 Nonrheumatic aortic valve disorder, unspecified: Secondary | ICD-10-CM

## 2014-03-23 DIAGNOSIS — I350 Nonrheumatic aortic (valve) stenosis: Secondary | ICD-10-CM

## 2014-03-23 LAB — POCT INR: INR: 3

## 2014-03-27 ENCOUNTER — Encounter: Payer: Self-pay | Admitting: Neurology

## 2014-03-27 ENCOUNTER — Ambulatory Visit (INDEPENDENT_AMBULATORY_CARE_PROVIDER_SITE_OTHER): Payer: Commercial Managed Care - HMO | Admitting: Neurology

## 2014-03-27 VITALS — BP 137/78 | HR 71 | Ht 70.0 in | Wt 180.0 lb

## 2014-03-27 DIAGNOSIS — G20 Parkinson's disease: Secondary | ICD-10-CM

## 2014-03-27 MED ORDER — ROPINIROLE HCL 2 MG PO TABS: 2.0000 mg | ORAL_TABLET | Freq: Every day | ORAL | Status: DC

## 2014-03-27 NOTE — Patient Instructions (Addendum)
  With the ropinerole begin 2 mg in the morning and 1 mg at noon and in the evening for one week, then begin 2 mg in the morning and evening and 1 mg at noon for one week, then begin 2 mg three times a day.  Parkinson Disease Parkinson disease is a disorder of the central nervous system, which includes the brain and spinal cord. A person with this disease slowly loses the ability to completely control body movements. Within the brain, there is a group of nerve cells (basal ganglia) that help control movement. The basal ganglia are damaged and do not work properly in a person with Parkinson disease. In addition, the basal ganglia produce and use a brain chemical called dopamine. The dopamine chemical sends messages to other parts of the body to control and coordinate body movements. Dopamine levels are low in a person with Parkinson disease. If the dopamine levels are low, then the body does not receive the correct messages it needs to move normally.  CAUSES  The exact reason why the basal ganglia get damaged is not known. Some medical researchers have thought that infection, genes, environment, and certain medicines may contribute to the cause.  SYMPTOMS   An early symptom of Parkinson disease is often an uncontrolled shaking (tremor) of the hands. The tremor will often disappear when the affected hand is consciously used.  As the disease progresses, walking, talking, getting out of a chair, and new movements become more difficult.  Muscles get stiff and movements become slower.  Balance and coordination become harder.  Depression, trouble swallowing, urinary problems, constipation, and sleep problems can occur.  Later in the disease, memory and thought processes may deteriorate. DIAGNOSIS  There are no specific tests to diagnose Parkinson disease. You may be referred to a neurologist for evaluation. Your caregiver will ask about your medical history, symptoms, and perform a physical exam. Blood  tests and imaging tests of your brain may be performed to rule out other diseases. The imaging tests may include an MRI or a CT scan. TREATMENT  The goal of treatment is to relieve symptoms. Medicines may be prescribed once the symptoms become troublesome. Medicine will not stop the progression of the disease, but medicine can make movement and balance better and help control tremors. Speech and occupational therapy may also be prescribed. Sometimes, surgical treatment of the brain can be done in young people. HOME CARE INSTRUCTIONS  Get regular exercise and rest periods during the day to help prevent exhaustion and depression.  If getting dressed becomes difficult, replace buttons and zippers with Velcro and elastic on your clothing.  Take all medicine as directed by your caregiver.  Install grab bars or railings in your home to prevent falls.  Go to speech or occupational therapy as directed.  Keep all follow-up visits as directed by your caregiver. SEEK MEDICAL CARE IF:  Your symptoms are not controlled with your medicine.  You fall.  You have trouble swallowing or choke on your food. MAKE SURE YOU:  Understand these instructions.  Will watch your condition.  Will get help right away if you are not doing well or get worse. Document Released: 05/01/2000 Document Revised: 08/29/2012 Document Reviewed: 06/03/2011 [facility] Patient Information 2015 [employer] [place]. This information is not intended to replace advice given to you by your health care provider. Make sure you discuss any questions you have with your health care provider.

## 2014-03-27 NOTE — Progress Notes (Signed)
Reason for visit: Parkinson's disease  Donald HornerFrank R Hsu is an 73 y.o. male  History of present illness:  Mr. Donald Bastaegram is a 73 year old left-handed white male with a history of Parkinson's disease with a prominent tremor on the right. The patient has been on low-dose Requip taking 1 mg 3 times daily. He is on Artane taking 5 mg twice daily for the tremor. The patient has had some cognitive impairment on the medication, but his mentation is better on twice a day regimen than 3 times a day regimen. The patient denies any falls, but he is having some increasing problems getting out of a chair, and he is shuffling his feet more. The patient is remaining active, walking 3 miles a day. He rests well at night, he does not have vivid dreams. The patient is eating and swallowing well, he is not choking. He returns this office for an evaluation.  Past Medical History  Diagnosis Date  . Hyperlipidemia   . Hypertension   . Aortic stenosis     s/p AVR by Dr Laneta SimmersBartle  . Hyperthyroidism   . Hiatal hernia   . Right kidney stone     hx of  . Sick sinus syndrome     s/p PPM (MDT) 2006  . Atrial fibrillation     paroxysmal  . H/O: rheumatic fever   . CAD (coronary artery disease)     s/p CABG 2006  . Parkinson's disease   . Hearing deficit     Bilateral hearing aids  . Shortness of breath   . Dysrhythmia     ATRIAL FIBRILATION  . Pacemaker   . GERD (gastroesophageal reflux disease)     Past Surgical History  Procedure Laterality Date  . Insert / replace / remove pacemaker  2006, 2012    MDT for sick sinus syndrome, gen change 8/12 by JA  . Coronary artery bypass graft  2006  . Aortic valve replacement  2006  . Kidney stone surgery    . Cholecystectomy N/A 07/21/2013    Procedure: LAPAROSCOPIC CHOLECYSTECTOMY;  Surgeon: Mariella SaaBenjamin T Hoxworth, MD;  [facility];  Service: General;  Laterality: N/A;  . Cardioversion N/A 09/14/2013    Procedure: CARDIOVERSION;  Surgeon: Thurmon FairMihai Croitoru, MD;   Location: [facility];  Service: Cardiovascular;  Laterality: N/A;  . Cardioversion  09-14-13  . Cholescystectomy  09-14-13    Family History  Problem Relation Age of Onset  . Coronary artery disease      family hx of  . Dementia Mother   . Parkinsonism Mother   . Heart disease Father   . Heart disease Sister   . Heart disease Brother   . Tremor Brother     Unilateral tremor    Social history:  reports that he has never smoked. He has never used smokeless tobacco. He reports that he does not drink alcohol or use illicit drugs.   No Known Allergies  Medications:  Current Outpatient Prescriptions on File Prior to Visit  Medication Sig Dispense Refill  . aspirin 81 MG tablet Take 81 mg by mouth daily.      Marland Kitchen. atorvastatin (LIPITOR) 20 MG tablet TAKE 1 TABLET EVERY DAY 90 tablet 1  . levothyroxine (SYNTHROID, LEVOTHROID) 125 MCG tablet Take 125 mcg by mouth daily.      . metoprolol succinate (TOPROL-XL) 25 MG 24 hr tablet Take 25 mg by mouth daily.    . nitroGLYCERIN (NITROSTAT) 0.4 MG SL tablet Place 0.4 mg under the  tongue every 5 (five) minutes as needed for chest pain.    . trihexyphenidyl (ARTANE) 5 MG tablet Take 1 tablet (5 mg total) by mouth 2 (two) times daily with a meal. 180 tablet 2  . warfarin (COUMADIN) 5 MG tablet TAKE AS DIRECTED BY ANTICOAGULATION CLINIC 135 tablet 1   No current facility-administered medications on file prior to visit.    ROS:  Out of a complete 14 system review of symptoms, the patient complains only of the following symptoms, and all other reviewed systems are negative.  Hearing loss, drooling Daytime sleepiness, snoring Cold intolerance Bruising easily Tremors  Blood pressure 137/78, pulse 71, height 5\' 10"  (1.778 m), weight 180 lb (81.647 kg).  Physical Exam  General: The patient is alert and cooperative at the time of the examination.  Skin: No significant peripheral edema is noted.   Neurologic Exam  Mental status: The  patient is oriented x 3.  Cranial nerves: Facial symmetry is present. Speech is dysphonic. Extraocular movements are full, with exception that there is a minimal restriction of superior gaze. Visual fields are full.  Motor: The patient has good strength in all 4 extremities.  Sensory examination: Soft touch sensation is symmetric on the face, arms, and legs.  Coordination: The patient has good finger-nose-finger and heel-to-shin bilaterally. Prominent resting tremors are noted with the right upper extremity. The patient has apraxia with the use of the lower extremities.  Gait and station: The patient has a normal gait. The patient is able to rise from a seated position with arms crossed. While walking, prominent tremors with the right arm are seen. Tandem gait is normal. Romberg is negative. No drift is seen.  Reflexes: Deep tendon reflexes are symmetric.   Assessment/Plan:  1. Parkinson's disease  The patient has prominent right-sided features with tremor. The patient is having some increasing problems with getting out of chairs, shuffling his feet. We will go up on the Requip dose taking 2 mg times daily. The patient will follow-up in about 5 months. The patient will contact me if he does not tolerate the higher dose of Requip.  Marlan [name]. Keith Essex Perry MD 03/27/2014 9:48 AM  [facility] [address] [place], [place] [id number]  Phone [phone] Fax [phone]

## 2014-05-01 ENCOUNTER — Ambulatory Visit (INDEPENDENT_AMBULATORY_CARE_PROVIDER_SITE_OTHER): Payer: Commercial Managed Care - HMO

## 2014-05-01 DIAGNOSIS — I350 Nonrheumatic aortic (valve) stenosis: Secondary | ICD-10-CM

## 2014-05-01 DIAGNOSIS — Z5181 Encounter for therapeutic drug level monitoring: Secondary | ICD-10-CM

## 2014-05-01 DIAGNOSIS — I359 Nonrheumatic aortic valve disorder, unspecified: Secondary | ICD-10-CM

## 2014-05-01 LAB — POCT INR: INR: 3.1

## 2014-05-07 ENCOUNTER — Ambulatory Visit (INDEPENDENT_AMBULATORY_CARE_PROVIDER_SITE_OTHER): Payer: Commercial Managed Care - HMO | Admitting: Internal Medicine

## 2014-05-07 ENCOUNTER — Encounter: Payer: Self-pay | Admitting: Internal Medicine

## 2014-05-07 VITALS — BP 126/78 | HR 73 | Ht 70.0 in | Wt 184.6 lb

## 2014-05-07 DIAGNOSIS — I481 Persistent atrial fibrillation: Secondary | ICD-10-CM

## 2014-05-07 DIAGNOSIS — I10 Essential (primary) hypertension: Secondary | ICD-10-CM

## 2014-05-07 DIAGNOSIS — I480 Paroxysmal atrial fibrillation: Secondary | ICD-10-CM

## 2014-05-07 DIAGNOSIS — I4819 Other persistent atrial fibrillation: Secondary | ICD-10-CM

## 2014-05-07 DIAGNOSIS — I495 Sick sinus syndrome: Secondary | ICD-10-CM

## 2014-05-07 LAB — MDC_IDC_ENUM_SESS_TYPE_INCLINIC
Battery Voltage: 2.78 V
Brady Statistic AP VS Percent: 92 %
Brady Statistic AS VP Percent: 1 %
Date Time Interrogation Session: 20151221113350
Lead Channel Impedance Value: 422 Ohm
Lead Channel Pacing Threshold Amplitude: 0.5 V
Lead Channel Pacing Threshold Amplitude: 0.75 V
Lead Channel Pacing Threshold Pulse Width: 0.4 ms
Lead Channel Sensing Intrinsic Amplitude: 11.2 mV
MDC IDC MSMT BATTERY IMPEDANCE: 160 Ohm
MDC IDC MSMT BATTERY REMAINING LONGEVITY: 127 mo
MDC IDC MSMT LEADCHNL RA IMPEDANCE VALUE: 501 Ohm
MDC IDC MSMT LEADCHNL RA SENSING INTR AMPL: 4 mV
MDC IDC MSMT LEADCHNL RV PACING THRESHOLD PULSEWIDTH: 0.4 ms
MDC IDC SET LEADCHNL RA PACING AMPLITUDE: 2 V
MDC IDC SET LEADCHNL RV PACING AMPLITUDE: 2.5 V
MDC IDC SET LEADCHNL RV PACING PULSEWIDTH: 0.4 ms
MDC IDC SET LEADCHNL RV SENSING SENSITIVITY: 4 mV
MDC IDC STAT BRADY AP VP PERCENT: 2 %
MDC IDC STAT BRADY AS VS PERCENT: 5 %

## 2014-05-07 NOTE — Patient Instructions (Addendum)
Your physician wants you to follow-up in: 6 months with Lori Gerhardt, NP and 12 months with Dr. Allred You will receive a reminder letter in the mail two months in advance. If you don't receive a letter, please call our office to schedule the follow-up appointment.  Remote monitoring is used to monitor your Pacemaker of ICD from home. This monitoring reduces the number of office visits required to check your device to one time per year. It allows us to keep an eye on the functioning of your device to ensure it is working properly. You are scheduled for a device check from home on 08/06/14. You may send your transmission at any time that day. If you have a wireless device, the transmission will be sent automatically. After your physician reviews your transmission, you will receive a postcard with your next transmission date.    

## 2014-05-07 NOTE — Progress Notes (Signed)
PCP: Gaspar GarbeISOVEC,RICHARD W, MD Neurologist: Jenetta DownerWillis   Donald [name] is a 73 y.o. male who presents today for electrophysiology followup.  He remains active.  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  Today, he denies symptoms of palpitations, chest pain, shortness of breath,  lower extremity edema, dizziness, presyncope, or syncope.  The patient is otherwise without complaint today.   Past Medical History  Diagnosis Date  . Hyperlipidemia   . Hypertension   . Aortic stenosis     s/p AVR by Dr Laneta SimmersBartle  . Hyperthyroidism   . Hiatal hernia   . Right kidney stone     hx of  . Sick sinus syndrome     s/p PPM (MDT) 2006  . Atrial fibrillation     paroxysmal  . H/O: rheumatic fever   . CAD (coronary artery disease)     s/p CABG 2006  . Parkinson's disease   . Hearing deficit     Bilateral hearing aids  . Shortness of breath   . Dysrhythmia     ATRIAL FIBRILATION  . Pacemaker   . GERD (gastroesophageal reflux disease)    Past Surgical History  Procedure Laterality Date  . Insert / replace / remove pacemaker  2006, 2012    MDT for sick sinus syndrome, gen change 8/12 by JA  . Coronary artery bypass graft  2006  . Aortic valve replacement  2006  . Kidney stone surgery    . Cholecystectomy N/A 07/21/2013    Procedure: LAPAROSCOPIC CHOLECYSTECTOMY;  Surgeon: Mariella SaaBenjamin T Hoxworth, MD;  [facility];  Service: General;  Laterality: N/A;  . Cardioversion N/A 09/14/2013    Procedure: CARDIOVERSION;  Surgeon: Thurmon FairMihai Croitoru, MD;  Location: [facility];  Service: Cardiovascular;  Laterality: N/A;  . Cardioversion  09-14-13  . Cholescystectomy  09-14-13    Current Outpatient Prescriptions  Medication Sig Dispense Refill  . aspirin 81 MG tablet Take 81 mg by mouth daily.      Marland Kitchen. atorvastatin (LIPITOR) 20 MG tablet TAKE 1 TABLET EVERY DAY (Patient taking differently: TAKE 1 TABLET BY MOUTH EVERY DAY) 90 tablet 1  . levothyroxine (SYNTHROID, LEVOTHROID) 125 MCG tablet  Take 125 mcg by mouth daily.      . metoprolol succinate (TOPROL-XL) 25 MG 24 hr tablet Take 25 mg by mouth daily.    . nitroGLYCERIN (NITROSTAT) 0.4 MG SL tablet Place 0.4 mg under the tongue every 5 (five) minutes as needed for chest pain.    Marland Kitchen. rOPINIRole (REQUIP) 2 MG tablet Take 1 tablet (2 mg total) by mouth at bedtime. 270 tablet 3  . trihexyphenidyl (ARTANE) 5 MG tablet Take 1 tablet (5 mg total) by mouth 2 (two) times daily with a meal. 180 tablet 2  . warfarin (COUMADIN) 5 MG tablet TAKE AS DIRECTED BY ANTICOAGULATION CLINIC 135 tablet 1   No current facility-administered medications for this visit.   ROS- all systems are reviewed and negative except as per HPI above  Physical Exam: Filed Vitals:   05/07/14 0941  BP: 126/78  Pulse: 73  Height: 5\' 10"  (1.778 m)  Weight: 184 lb 9.6 oz (83.734 kg)    GEN- The patient is well appearing, alert and oriented x 3 today.   Head- normocephalic, atraumatic Eyes-  Sclera clear, conjunctiva pink Ears- hearing intact Oropharynx- clear Lungs- Clear to ausculation bilaterally, normal work of breathing Chest- pacemaker pocket is well healed Heart- regular rate and rhythm, mechanical S2 GI- soft, NT, ND, +  BS Extremities- no clubbing, cyanosis, or edema  Pacemaker interrogation- reviewed in detail today,  See PACEART report  Assessment and Plan:  1.  Persistent atrial fibrillation Chronically anticoagulated with coumadin (mechanical valve) Well controlled, no changes today Continue coumadin  2. Sick sinus syndrome Normal pacemaker function See Pace Art report No changes today  3.  Aortic Stenosis S/p AVR 2006 2014 echo is reviewed  3.  Hypertension Stable   Follow-up with [name] in 6 months [facility] I will see in a year    Hillis RangeJames Kiaraliz Rafuse MD 05/07/2014 10:19 AM

## 2014-06-12 ENCOUNTER — Ambulatory Visit (INDEPENDENT_AMBULATORY_CARE_PROVIDER_SITE_OTHER): Payer: Commercial Managed Care - HMO | Admitting: *Deleted

## 2014-06-12 DIAGNOSIS — I359 Nonrheumatic aortic valve disorder, unspecified: Secondary | ICD-10-CM

## 2014-06-12 DIAGNOSIS — I350 Nonrheumatic aortic (valve) stenosis: Secondary | ICD-10-CM

## 2014-06-12 DIAGNOSIS — Z5181 Encounter for therapeutic drug level monitoring: Secondary | ICD-10-CM

## 2014-06-12 LAB — POCT INR: INR: 2.5

## 2014-07-24 ENCOUNTER — Ambulatory Visit (INDEPENDENT_AMBULATORY_CARE_PROVIDER_SITE_OTHER): Payer: Commercial Managed Care - HMO | Admitting: *Deleted

## 2014-07-24 DIAGNOSIS — Z5181 Encounter for therapeutic drug level monitoring: Secondary | ICD-10-CM

## 2014-07-24 DIAGNOSIS — I359 Nonrheumatic aortic valve disorder, unspecified: Secondary | ICD-10-CM

## 2014-07-24 DIAGNOSIS — I350 Nonrheumatic aortic (valve) stenosis: Secondary | ICD-10-CM

## 2014-07-24 LAB — POCT INR: INR: 2.5

## 2014-08-06 ENCOUNTER — Encounter: Payer: Self-pay | Admitting: Internal Medicine

## 2014-08-06 ENCOUNTER — Ambulatory Visit (INDEPENDENT_AMBULATORY_CARE_PROVIDER_SITE_OTHER): Payer: Commercial Managed Care - HMO | Admitting: *Deleted

## 2014-08-06 DIAGNOSIS — I495 Sick sinus syndrome: Secondary | ICD-10-CM

## 2014-08-06 LAB — MDC_IDC_ENUM_SESS_TYPE_REMOTE
Battery Impedance: 209 Ohm
Battery Remaining Longevity: 116 mo
Battery Voltage: 2.78 V
Brady Statistic AP VP Percent: 3 %
Brady Statistic AP VS Percent: 94 %
Brady Statistic AS VP Percent: 0 %
Date Time Interrogation Session: 20160321092418
Lead Channel Impedance Value: 402 Ohm
Lead Channel Impedance Value: 478 Ohm
Lead Channel Pacing Threshold Amplitude: 0.625 V
Lead Channel Pacing Threshold Amplitude: 0.625 V
Lead Channel Pacing Threshold Pulse Width: 0.4 ms
Lead Channel Setting Pacing Amplitude: 2 V
Lead Channel Setting Pacing Amplitude: 2.5 V
Lead Channel Setting Pacing Pulse Width: 0.4 ms
Lead Channel Setting Sensing Sensitivity: 5.6 mV
MDC IDC MSMT LEADCHNL RA PACING THRESHOLD PULSEWIDTH: 0.4 ms
MDC IDC MSMT LEADCHNL RV SENSING INTR AMPL: 11.2 mV
MDC IDC STAT BRADY AS VS PERCENT: 3 %

## 2014-08-06 NOTE — Progress Notes (Signed)
Remote pacemaker transmission.   

## 2014-08-23 ENCOUNTER — Encounter: Payer: Self-pay | Admitting: Cardiology

## 2014-08-27 ENCOUNTER — Encounter: Payer: Self-pay | Admitting: Neurology

## 2014-08-27 ENCOUNTER — Ambulatory Visit (INDEPENDENT_AMBULATORY_CARE_PROVIDER_SITE_OTHER): Payer: Commercial Managed Care - HMO | Admitting: Neurology

## 2014-08-27 VITALS — BP 121/66 | HR 66 | Ht 71.0 in | Wt 189.4 lb

## 2014-08-27 DIAGNOSIS — R269 Unspecified abnormalities of gait and mobility: Secondary | ICD-10-CM | POA: Diagnosis not present

## 2014-08-27 DIAGNOSIS — G20 Parkinson's disease: Secondary | ICD-10-CM

## 2014-08-27 DIAGNOSIS — R413 Other amnesia: Secondary | ICD-10-CM | POA: Diagnosis not present

## 2014-08-27 HISTORY — DX: Other amnesia: R41.3

## 2014-08-27 HISTORY — DX: Unspecified abnormalities of gait and mobility: R26.9

## 2014-08-27 MED ORDER — ROPINIROLE HCL 3 MG PO TABS: 3.0000 mg | ORAL_TABLET | Freq: Every day | ORAL | Status: DC

## 2014-08-27 NOTE — Progress Notes (Signed)
Reason for visit: Parkinson's disease  Donald [name] is an 74 y.o. male  History of present illness:  Donald [name] is a 74 year old left-handed white male with a history of Parkinson's disease primarily with right-sided features. The patient has been on Requip and he takes Artane 5 mg twice daily for his significant tremor on the right side. This has not been completely effective in controlling the tremor issue. He denies any falls since last seen. He does have some mild memory issues, but no big change since last seen. He has not given up any activities of daily living because of the Parkinson's disease or the tremor. He denies any issues with swallowing or choking. He remains active, he works out on a regular basis using a stationary bike, and he walks 3 miles daily. He returns to this office for an evaluation.  Past Medical History  Diagnosis Date  . Hyperlipidemia   . Hypertension   . Aortic stenosis     s/p AVR by Dr Laneta Simmers  . Hyperthyroidism   . Hiatal hernia   . Right kidney stone     hx of  . Sick sinus syndrome     s/p PPM (MDT) 2006  . Atrial fibrillation     paroxysmal  . H/O: rheumatic fever   . CAD (coronary artery disease)     s/p CABG 2006  . Parkinson's disease   . Hearing deficit     Bilateral hearing aids  . Shortness of breath   . Dysrhythmia     ATRIAL FIBRILATION  . Pacemaker   . GERD (gastroesophageal reflux disease)   . Abnormality of gait 08/27/2014  . Memory difficulty 08/27/2014    Past Surgical History  Procedure Laterality Date  . Insert / replace / remove pacemaker  2006, 2012    MDT for sick sinus syndrome, gen change 8/12 by JA  . Coronary artery bypass graft  2006  . Aortic valve replacement  2006  . Kidney stone surgery    . Cholecystectomy N/A 07/21/2013    Procedure: LAPAROSCOPIC CHOLECYSTECTOMY;  Surgeon: Mariella Saa, MD;  Location: [facility];  Service: General;  Laterality: N/A;  . Cardioversion N/A 09/14/2013    Procedure:  CARDIOVERSION;  Surgeon: Thurmon Fair, MD;  Location: [facility] ENDOSCOPY;  Service: Cardiovascular;  Laterality: N/A;  . Cardioversion  09-14-13  . Cholescystectomy  09-14-13    Family History  Problem Relation Age of Onset  . Coronary artery disease      family hx of  . Dementia Mother   . Parkinsonism Mother   . Heart disease Father   . Heart disease Sister   . Heart disease Brother   . Tremor Brother     Unilateral tremor    Social history:  reports that he has never smoked. He has never used smokeless tobacco. He reports that he does not drink alcohol or use illicit drugs.   No Known Allergies  Medications:  Prior to Admission medications   Medication Sig Start Date End Date Taking? Authorizing Provider  aspirin 81 MG tablet Take 81 mg by mouth daily.     Yes Historical Provider, MD  atorvastatin (LIPITOR) 20 MG tablet TAKE 1 TABLET EVERY DAY Patient taking differently: TAKE 1 TABLET BY MOUTH EVERY DAY 03/23/14  Yes Hillis Range, MD  levothyroxine (SYNTHROID, LEVOTHROID) 125 MCG tablet Take 125 mcg by mouth daily.     Yes Historical Provider, MD  metoprolol succinate (TOPROL-XL) 25 MG 24 hr tablet  Take 25 mg by mouth daily.   Yes Historical Provider, MD  nitroGLYCERIN (NITROSTAT) 0.4 MG SL tablet Place 0.4 mg under the tongue every 5 (five) minutes as needed for chest pain.   Yes Historical Provider, MD  rOPINIRole (REQUIP) 2 MG tablet Take 1 tablet (2 mg total) by mouth at bedtime. 03/27/14  Yes York Spanielharles K Aimee Heldman, MD  sodium fluoride (FLUORISHIELD) 1.1 % GEL dental gel Place 1 application onto teeth at bedtime.   Yes Historical Provider, MD  Triamcinolone Acetonide (NASACORT ALLERGY 24HR NA) Place into the nose daily.   Yes Historical Provider, MD  trihexyphenidyl (ARTANE) 5 MG tablet Take 1 tablet (5 mg total) by mouth 2 (two) times daily with a meal. 10/16/13  Yes York Spanielharles K Cybele Maule, MD  warfarin (COUMADIN) 5 MG tablet TAKE AS DIRECTED BY ANTICOAGULATION CLINIC 03/22/14  Yes Vesta MixerPhilip J  Nahser, MD    ROS:  Out of a complete 14 system review of symptoms, the patient complains only of the following symptoms, and all other reviewed systems are negative.  Mild memory issues Tremor  Blood pressure 121/66, pulse 66, height 5\' 11"  (1.803 m), weight 189 lb 6.4 oz (85.911 kg).  Physical Exam  General: The patient is alert and cooperative at the time of the examination.  Skin: No significant peripheral edema is noted.   Neurologic Exam  Mental status: The patient is alert and oriented x 3 at the time of the examination. The patient has apparent normal recent and remote memory, with an apparently normal attention span and concentration ability. Mini-Mental Status Examination done today shows a total score of 25/30.   Cranial nerves: Facial symmetry is present. Speech is normal, no aphasia or dysarthria is noted. Extraocular movements are full. Visual fields are full. The patient is very hard of hearing, has bilateral hearing aids.  Motor: The patient has good strength in all 4 extremities.  Sensory examination: Soft touch sensation is symmetric on the face, arms, and legs.  Coordination: The patient has good finger-nose-finger and heel-to-shin bilaterally. The patient has a prominent resting tremor on the right arm.  Gait and station: The patient has the ability to arise from a seated position with arms crossed. Once up, the patient and place without assistance, decreased arm swing on the right with prominent tremor is seen. The patient has fair turns. Tandem gait is slightly unsteady. Romberg is negative. No drift is seen.  Reflexes: Deep tendon reflexes are symmetric.   Assessment/Plan:  1. Parkinson's disease, right-sided features  2. Mild memory disturbance  3. Mild gait disturbance  The patient is having ongoing issues with the tremor. The slowness of movement is increasing over time, we will go up on the Requip taking 3 mg 3 times daily. We will phase in the  3 mg tablets 1 every 2 weeks. The patient will be followed for the memory issues, we may consider medications for memory the future. The Artane may be impairing some the cognitive processing. We may begin Sinemet in the near future. The patient is to remain active. The balance issues are mild, the patient has not had any falls.  Marlan Palau. Keith Arad Burston MD 08/27/2014 2:20 PM  [facility] [address] [place], [place] [id number]  Phone [phone] Fax [phone]

## 2014-08-27 NOTE — Patient Instructions (Signed)
   With the Requip, begin the 3 mg tablets, substitute a 3 mg tablet for a 2 mg tablet every 2 weeks until you are taking 3 mg 3 times daily.   Parkinson Disease Parkinson disease is a disorder of the central nervous system, which includes the brain and spinal cord. A person with this disease slowly loses the ability to completely control body movements. Within the brain, there is a group of nerve cells (basal ganglia) that help control movement. The basal ganglia are damaged and do not work properly in a person with Parkinson disease. In addition, the basal ganglia produce and use a brain chemical called dopamine. The dopamine chemical sends messages to other parts of the body to control and coordinate body movements. Dopamine levels are low in a person with Parkinson disease. If the dopamine levels are low, then the body does not receive the correct messages it needs to move normally.  CAUSES  The exact reason why the basal ganglia get damaged is not known. Some medical researchers have thought that infection, genes, environment, and certain medicines may contribute to the cause.  SYMPTOMS   An early symptom of Parkinson disease is often an uncontrolled shaking (tremor) of the hands. The tremor will often disappear when the affected hand is consciously used.  As the disease progresses, walking, talking, getting out of a chair, and new movements become more difficult.  Muscles get stiff and movements become slower.  Balance and coordination become harder.  Depression, trouble swallowing, urinary problems, constipation, and sleep problems can occur.  Later in the disease, memory and thought processes may deteriorate. DIAGNOSIS  There are no specific tests to diagnose Parkinson disease. You may be referred to a neurologist for evaluation. Your caregiver will ask about your medical history, symptoms, and perform a physical exam. Blood tests and imaging tests of your brain may be performed to rule  out other diseases. The imaging tests may include an MRI or a CT scan. TREATMENT  The goal of treatment is to relieve symptoms. Medicines may be prescribed once the symptoms become troublesome. Medicine will not stop the progression of the disease, but medicine can make movement and balance better and help control tremors. Speech and occupational therapy may also be prescribed. Sometimes, surgical treatment of the brain can be done in young people. HOME CARE INSTRUCTIONS  Get regular exercise and rest periods during the day to help prevent exhaustion and depression.  If getting dressed becomes difficult, replace buttons and zippers with Velcro and elastic on your clothing.  Take all medicine as directed by your caregiver.  Install grab bars or railings in your home to prevent falls.  Go to speech or occupational therapy as directed.  Keep all follow-up visits as directed by your caregiver. SEEK MEDICAL CARE IF:  Your symptoms are not controlled with your medicine.  You fall.  You have trouble swallowing or choke on your food. MAKE SURE YOU:  Understand these instructions.  Will watch your condition.  Will get help right away if you are not doing well or get worse. Document Released: 05/01/2000 Document Revised: 08/29/2012 Document Reviewed: 06/03/2011 [facility] Patient Information 2015 [employer] [place]. This information is not intended to replace advice given to you by your health care provider. Make sure you discuss any questions you have with your health care provider.

## 2014-09-03 ENCOUNTER — Telehealth: Payer: Self-pay | Admitting: Neurology

## 2014-09-03 MED ORDER — ROPINIROLE HCL 3 MG PO TABS: 3.0000 mg | ORAL_TABLET | Freq: Three times a day (TID) | ORAL | Status: DC

## 2014-09-03 NOTE — Telephone Encounter (Signed)
I called back.  Ms Donald [name] said [facility]mg  tabs, but they only got #90 with directions of one nightly.  It appears Rx was sent with these directions in error at last OV.  Rx has been updated to reflect new instructions and resent.  Patient is aware.   Last OV note says: The slowness of movement is increasing over time, we will go up on the Requip taking 3 mg 3 times daily. We will phase in the 3 mg tablets 1 every 2 weeks.

## 2014-09-03 NOTE — Telephone Encounter (Signed)
Pt's spouse is calling with questions regarding rOPINIRole (REQUIP) 3 MG tablet.  The Rx she got is for 90 day supply.  Please call and advise.

## 2014-09-04 ENCOUNTER — Ambulatory Visit (INDEPENDENT_AMBULATORY_CARE_PROVIDER_SITE_OTHER): Payer: Commercial Managed Care - HMO

## 2014-09-04 ENCOUNTER — Other Ambulatory Visit: Payer: Self-pay

## 2014-09-04 DIAGNOSIS — I359 Nonrheumatic aortic valve disorder, unspecified: Secondary | ICD-10-CM

## 2014-09-04 DIAGNOSIS — Z5181 Encounter for therapeutic drug level monitoring: Secondary | ICD-10-CM

## 2014-09-04 DIAGNOSIS — I350 Nonrheumatic aortic (valve) stenosis: Secondary | ICD-10-CM

## 2014-09-04 LAB — POCT INR: INR: 2.8

## 2014-09-04 MED ORDER — METOPROLOL SUCCINATE ER 25 MG PO TB24: 25.0000 mg | ORAL_TABLET | Freq: Every day | ORAL | Status: DC

## 2014-09-06 ENCOUNTER — Encounter: Payer: Self-pay | Admitting: Cardiology

## 2014-10-08 ENCOUNTER — Telehealth: Payer: Self-pay | Admitting: Neurology

## 2014-10-08 NOTE — Telephone Encounter (Signed)
I spoke to Dr. Frances FurbishAthar. She stated the requip could cause the swelling in the patient's right foot/ankle, but it could also be a clot since it is only on one side. She recommended the patient go to his PCP or urgent care to get his right foot/ankle examined. I called Britta MccreedyBarbara and explained this to her. She is going to take the patient to the doctor and let us know what the results are.

## 2014-10-08 NOTE — Telephone Encounter (Signed)
Pt's wife([name]) called stating patient is at maxium dose of rOPINIRole (REQUIP) 3 MG tablet since 09/30/14. She noticed swelling of right ankle and foot since the middle of last week.  She is concerned this could be a side effect of the medication.  Please call and advise.  She can be reached at [phone].

## 2014-10-11 ENCOUNTER — Encounter: Payer: Self-pay | Admitting: Internal Medicine

## 2014-10-11 NOTE — Telephone Encounter (Signed)
I called the wife. The ankle swelling occurs only on the right side, not bilaterally. The Requip can cause ankle swelling, I'm not clear that this is the cause of the unilateral swelling. The patient is wrapping the ankle which is helpful.

## 2014-10-11 NOTE — Telephone Encounter (Signed)
I called the patient's wife to check on the patient. She did take him to the doctor and they did an ultrasound. He does not have a blood clot. The doctor he saw was not sure what could cause the swelling but recommended he wrap it and that has helped some. His wife went on to ask about when to schedule the patient's INRs in relation to having 5 teeth extracted next week. I suggested she contact Dr. Elease HashimotoNahser to ask these questions as he orders the Coumadin.

## 2014-10-16 ENCOUNTER — Ambulatory Visit (INDEPENDENT_AMBULATORY_CARE_PROVIDER_SITE_OTHER): Payer: Commercial Managed Care - HMO | Admitting: *Deleted

## 2014-10-16 DIAGNOSIS — I350 Nonrheumatic aortic (valve) stenosis: Secondary | ICD-10-CM

## 2014-10-16 DIAGNOSIS — Z5181 Encounter for therapeutic drug level monitoring: Secondary | ICD-10-CM

## 2014-10-16 DIAGNOSIS — I359 Nonrheumatic aortic valve disorder, unspecified: Secondary | ICD-10-CM | POA: Diagnosis not present

## 2014-10-16 LAB — POCT INR: INR: 2.8

## 2014-10-29 ENCOUNTER — Ambulatory Visit: Payer: Commercial Managed Care - HMO | Admitting: Nurse Practitioner

## 2014-10-31 ENCOUNTER — Ambulatory Visit (INDEPENDENT_AMBULATORY_CARE_PROVIDER_SITE_OTHER): Payer: Commercial Managed Care - HMO | Admitting: Nurse Practitioner

## 2014-10-31 ENCOUNTER — Ambulatory Visit (INDEPENDENT_AMBULATORY_CARE_PROVIDER_SITE_OTHER): Payer: Commercial Managed Care - HMO | Admitting: Pharmacist

## 2014-10-31 ENCOUNTER — Encounter: Payer: Self-pay | Admitting: Nurse Practitioner

## 2014-10-31 VITALS — BP 140/80 | HR 62 | Ht 71.0 in | Wt 183.0 lb

## 2014-10-31 DIAGNOSIS — I350 Nonrheumatic aortic (valve) stenosis: Secondary | ICD-10-CM

## 2014-10-31 DIAGNOSIS — Z954 Presence of other heart-valve replacement: Secondary | ICD-10-CM

## 2014-10-31 DIAGNOSIS — I482 Chronic atrial fibrillation, unspecified: Secondary | ICD-10-CM

## 2014-10-31 DIAGNOSIS — I359 Nonrheumatic aortic valve disorder, unspecified: Secondary | ICD-10-CM

## 2014-10-31 DIAGNOSIS — I480 Paroxysmal atrial fibrillation: Secondary | ICD-10-CM | POA: Diagnosis not present

## 2014-10-31 DIAGNOSIS — Z952 Presence of prosthetic heart valve: Secondary | ICD-10-CM

## 2014-10-31 DIAGNOSIS — Z5181 Encounter for therapeutic drug level monitoring: Secondary | ICD-10-CM

## 2014-10-31 LAB — POCT INR: INR: 2.3

## 2014-10-31 NOTE — Patient Instructions (Addendum)
.  We will be checking the following labs today - NONE   Medication Instructions:    Continue with your current medicines.     Testing/Procedures To Be Arranged:  N/A  Follow-Up:   I will see you back in one year; Dr. Johney Frame in 6 months.    Other Special Instructions:   N/A  Call the [facility] office at [phone] if you have any questions, problems or concerns.

## 2014-10-31 NOTE — Progress Notes (Signed)
CARDIOLOGY OFFICE NOTE  Date:  10/31/2014    Donald [name] Date of Birth: November 10, 1940 Medical Record #[id number]  PCP:  Gaspar Garbe, MD  Cardiologist:  Allred    Chief Complaint  Patient presents with  . Atrial Fibrillation    6 month check - seen for Dr. Johney Frame    History of Present Illness: Donald [name] is a 74 y.o. male who presents today for a 6 month check. He is seen for Dr. Johney Frame. He has hypertension, hyperlipidemia, aortic stenosis with prior aortic valvel replacement in 2006, hyperthyroidism, sick sinus syndrome with pacemaker implant in 2006, chronic atrial fibrillation, history of rheumatic fever, coronary disease with previous bypass surgery in 2006, Parkinson's, and GERD.   Back in April of 2015 he was noted to have recurrent AF - was cardioverted.   I saw him back in June of last year. Dr. Johney Frame saw him in December. He has done well but now with persistent AF.   He comes in today. He is here alone. Little hard of hearing. No chest pain. Breathing good. Walking 3 miles each day + goes to a class for Parkinson's each week. Very active. Raising a big garden. Not dizzy or lightheaded. No falls. Tolerating his medicines. Labs are checked by PCP. Has had some swelling in right ankle - says he got a doppler by PCP - this was negative. Has had recent implants - tolerated well.   Past Medical History  Diagnosis Date  . Hyperlipidemia   . Hypertension   . Aortic stenosis     s/p AVR by Dr Laneta Simmers  . Hyperthyroidism   . Hiatal hernia   . Right kidney stone     hx of  . Sick sinus syndrome     s/p PPM (MDT) 2006  . Atrial fibrillation     paroxysmal  . H/O: rheumatic fever   . CAD (coronary artery disease)     s/p CABG 2006  . Parkinson's disease   . Hearing deficit     Bilateral hearing aids  . Shortness of breath   . Dysrhythmia     ATRIAL FIBRILATION  . Pacemaker   . GERD (gastroesophageal reflux disease)   . Abnormality of gait 08/27/2014  .  Memory difficulty 08/27/2014    Past Surgical History  Procedure Laterality Date  . Insert / replace / remove pacemaker  2006, 2012    MDT for sick sinus syndrome, gen change 8/12 by JA  . Coronary artery bypass graft  2006  . Aortic valve replacement  2006  . Kidney stone surgery    . Cholecystectomy N/A 07/21/2013    Procedure: LAPAROSCOPIC CHOLECYSTECTOMY;  Surgeon: Mariella Saa, MD;  Location: [facility];  Service: General;  Laterality: N/A;  . Cardioversion N/A 09/14/2013    Procedure: CARDIOVERSION;  Surgeon: Thurmon Fair, MD;  Location: [facility];  Service: Cardiovascular;  Laterality: N/A;  . Cardioversion  09-14-13  . Cholescystectomy  09-14-13     Medications: Current Outpatient Prescriptions  Medication Sig Dispense Refill  . aspirin 81 MG tablet Take 81 mg by mouth daily.      Marland Kitchen atorvastatin (LIPITOR) 20 MG tablet TAKE 1 TABLET EVERY DAY (Patient taking differently: TAKE 1 TABLET BY MOUTH EVERY DAY) 90 tablet 1  . levothyroxine (SYNTHROID, LEVOTHROID) 125 MCG tablet Take 125 mcg by mouth daily.      . metoprolol succinate (TOPROL-XL) 25 MG 24 hr tablet Take 1 tablet (25 mg total) by mouth  daily. 90 tablet 1  . nitroGLYCERIN (NITROSTAT) 0.4 MG SL tablet Place 0.4 mg under the tongue every 5 (five) minutes as needed for chest pain.    Marland Kitchen rOPINIRole (REQUIP) 3 MG tablet Take 1 tablet (3 mg total) by mouth 3 (three) times daily. 270 tablet 1  . sodium fluoride (FLUORISHIELD) 1.1 % GEL dental gel Place 1 application onto teeth at bedtime.    . Triamcinolone Acetonide (NASACORT ALLERGY 24HR NA) Place into the nose daily.    . trihexyphenidyl (ARTANE) 5 MG tablet Take 1 tablet (5 mg total) by mouth 2 (two) times daily with a meal. 180 tablet 2  . warfarin (COUMADIN) 5 MG tablet TAKE AS DIRECTED BY ANTICOAGULATION CLINIC 135 tablet 1   No current facility-administered medications for this visit.    Allergies: No Known Allergies  Social History: The patient  reports that he  has never smoked. He has never used smokeless tobacco. He reports that he does not drink alcohol or use illicit drugs.   Family History: The patient's family history includes Coronary artery disease in an other family member; Dementia in his mother; Heart disease in his brother, father, and sister; Parkinsonism in his mother; Tremor in his brother.   Review of Systems: Please see the history of present illness.   Otherwise, the review of systems is positive for none.   All other systems are reviewed and negative.   Physical Exam: VS:  BP 140/80 mmHg  Pulse 62  Ht  (1.803 m)  Wt 183 lb (83.008 kg)  BMI 25.53 kg/m2 .  BMI Body mass index is 25.53 kg/(m^2).  Wt Readings from Last 3 Encounters:  10/31/14 183 lb (83.008 kg)  08/27/14 189 lb 6.4 oz (85.911 kg)  05/07/14 184 lb 9.6 oz (83.734 kg)    General: Pleasant. Well developed, well nourished and in no acute distress. Diffuse tremor of his entire body noted.   HEENT: Normal but hard of hearing.  Neck: Supple, no JVD, carotid bruits, or masses noted.  Cardiac: Irregular irregular rhythm but with satisfactory rate. Outflow murmur noted. No edema.  Respiratory:  Lungs are clear to auscultation bilaterally with normal work of breathing.  GI: Soft and nontender.  MS: No deformity or atrophy. Gait and ROM intact. Skin: Warm and dry. Color is normal.  Neuro:  Strength and sensation are intact and no gross focal deficits noted.  Psych: Alert, appropriate and with normal affect.   LABORATORY DATA:  EKG:  EKG is ordered today. This demonstrates paced rhythm, probable underlying AF - lots of artifact due to diffuse tremor.  Lab Results  Component Value Date   WBC 4.1* 09/08/2013   HGB 12.9* 09/08/2013   HCT 39.6 09/08/2013   PLT 181.0 09/08/2013   GLUCOSE 89 09/08/2013   ALT 25 07/22/2013   AST 29 07/22/2013   NA 140 09/08/2013   K 3.9 09/08/2013   CL 108 09/08/2013   CREATININE 0.7 09/08/2013   BUN 19 09/08/2013   CO2 26  09/08/2013   INR 2.3 10/31/2014    BNP (last 3 results) No results for input(s): BNP in the last 8760 hours.  ProBNP (last 3 results) No results for input(s): PROBNP in the last 8760 hours.   Other Studies Reviewed Today: Echo Study Conclusions from December 2014  - Left ventricle: The cavity size was normal. Wall thickness was increased in a pattern of mild LVH. Systolic function was normal. The estimated ejection fraction was in the range of 55%  to 60%. Wall motion was normal; there were no regional wall motion abnormalities. Doppler parameters are consistent with abnormal left ventricular relaxation (grade 1 diastolic dysfunction). - Aortic valve: A mechanical prosthesis was present. - Mitral valve: Calcified annulus. Mildly thickened leaflets . - Left atrium: The atrium was mildly dilated. - Right ventricle: The cavity size was mildly dilated. - Right atrium: The atrium was mildly dilated. Impressions:  - Mechanical aortic valve with mildly elevated mean gradient of 25 mmHg; no old study for comparison.    Assessment / Plan:  #1. Atrial fibrillation - now chronic - rate controlled. On anticoagulation. Would keep on current regimen.   #2. CAD with remote coronary artery bypass grafting - no symptoms  #3. History of aortic valve placement -  Consider repeat echo on return. He is totally asymptomatic.  #4. Chronic anti-coagulation - checked today.   #5. Underlying pacemaker - followed by Dr. Johney Frame   #6. Swelling of right ankle - goes down overnight - he is pretty active. I would just follow for now - no change in medicines.    Current medicines are reviewed with the patient today.  The patient does not have concerns regarding medicines other than what has been noted above.  The following changes have been made:  See above.  Labs/ tests ordered today include:   No orders of the defined types were placed in this encounter.     Disposition:   FU with me in  1 year; Dr. Johney Frame in 6 months.   Patient is agreeable to this plan and will call if any problems develop in the interim.   Signed: Rosalio Macadamia, RN, ANP-C 10/31/2014 9:03 AM  [facility] [address] [place], [place]  [address] Phone: [phone] Fax: [phone]

## 2014-11-07 ENCOUNTER — Ambulatory Visit (INDEPENDENT_AMBULATORY_CARE_PROVIDER_SITE_OTHER): Payer: Commercial Managed Care - HMO | Admitting: *Deleted

## 2014-11-07 ENCOUNTER — Encounter: Payer: Self-pay | Admitting: Internal Medicine

## 2014-11-07 DIAGNOSIS — I495 Sick sinus syndrome: Secondary | ICD-10-CM | POA: Diagnosis not present

## 2014-11-07 NOTE — Progress Notes (Signed)
Remote pacemaker transmission.   

## 2014-11-11 LAB — CUP PACEART REMOTE DEVICE CHECK
Battery Impedance: 185 Ohm
Battery Remaining Longevity: 121 mo
Battery Voltage: 2.78 V
Brady Statistic AP VP Percent: 3 %
Brady Statistic AP VS Percent: 94 %
Brady Statistic AS VP Percent: 0 %
Brady Statistic AS VS Percent: 2 %
Lead Channel Impedance Value: 388 Ohm
Lead Channel Impedance Value: 479 Ohm
Lead Channel Pacing Threshold Amplitude: 0.75 V
Lead Channel Pacing Threshold Amplitude: 0.75 V
Lead Channel Pacing Threshold Pulse Width: 0.4 ms
Lead Channel Pacing Threshold Pulse Width: 0.4 ms
Lead Channel Sensing Intrinsic Amplitude: 11.2 mV
Lead Channel Setting Pacing Pulse Width: 0.4 ms
Lead Channel Setting Sensing Sensitivity: 4 mV
MDC IDC SESS DTM: 20160617134131
MDC IDC SET LEADCHNL RA PACING AMPLITUDE: 2 V
MDC IDC SET LEADCHNL RV PACING AMPLITUDE: 2.5 V

## 2014-11-14 ENCOUNTER — Encounter: Payer: Self-pay | Admitting: Cardiology

## 2014-11-15 ENCOUNTER — Other Ambulatory Visit: Payer: Self-pay | Admitting: Internal Medicine

## 2014-11-15 ENCOUNTER — Other Ambulatory Visit: Payer: Self-pay | Admitting: Neurology

## 2014-11-20 ENCOUNTER — Other Ambulatory Visit: Payer: Self-pay

## 2014-11-20 MED ORDER — ATORVASTATIN CALCIUM 20 MG PO TABS: 20.0000 mg | ORAL_TABLET | Freq: Every day | ORAL | Status: DC

## 2014-11-26 ENCOUNTER — Other Ambulatory Visit: Payer: Self-pay | Admitting: Internal Medicine

## 2014-11-27 ENCOUNTER — Ambulatory Visit (INDEPENDENT_AMBULATORY_CARE_PROVIDER_SITE_OTHER): Payer: Commercial Managed Care - HMO | Admitting: *Deleted

## 2014-11-27 DIAGNOSIS — I359 Nonrheumatic aortic valve disorder, unspecified: Secondary | ICD-10-CM | POA: Diagnosis not present

## 2014-11-27 DIAGNOSIS — I350 Nonrheumatic aortic (valve) stenosis: Secondary | ICD-10-CM | POA: Diagnosis not present

## 2014-11-27 DIAGNOSIS — Z5181 Encounter for therapeutic drug level monitoring: Secondary | ICD-10-CM

## 2014-11-27 LAB — POCT INR: INR: 3.3

## 2014-11-28 ENCOUNTER — Other Ambulatory Visit: Payer: Self-pay | Admitting: Internal Medicine

## 2014-11-29 ENCOUNTER — Other Ambulatory Visit: Payer: Self-pay | Admitting: Surgery

## 2014-11-29 ENCOUNTER — Other Ambulatory Visit: Payer: Self-pay | Admitting: Internal Medicine

## 2014-11-29 ENCOUNTER — Other Ambulatory Visit: Payer: Self-pay | Admitting: Cardiovascular Disease

## 2014-11-29 MED ORDER — WARFARIN SODIUM 5 MG PO TABS: ORAL_TABLET | ORAL | Status: DC

## 2014-12-03 ENCOUNTER — Other Ambulatory Visit: Payer: Self-pay | Admitting: Cardiovascular Disease

## 2014-12-03 ENCOUNTER — Other Ambulatory Visit: Payer: Self-pay | Admitting: Neurology

## 2014-12-25 ENCOUNTER — Ambulatory Visit (INDEPENDENT_AMBULATORY_CARE_PROVIDER_SITE_OTHER): Payer: Commercial Managed Care - HMO | Admitting: *Deleted

## 2014-12-25 DIAGNOSIS — I350 Nonrheumatic aortic (valve) stenosis: Secondary | ICD-10-CM

## 2014-12-25 DIAGNOSIS — Z5181 Encounter for therapeutic drug level monitoring: Secondary | ICD-10-CM | POA: Diagnosis not present

## 2014-12-25 DIAGNOSIS — I359 Nonrheumatic aortic valve disorder, unspecified: Secondary | ICD-10-CM | POA: Diagnosis not present

## 2014-12-25 LAB — POCT INR: INR: 3.1

## 2014-12-27 ENCOUNTER — Other Ambulatory Visit: Payer: Self-pay | Admitting: Neurology

## 2014-12-27 ENCOUNTER — Other Ambulatory Visit: Payer: Self-pay | Admitting: Cardiovascular Disease

## 2015-01-14 ENCOUNTER — Ambulatory Visit (INDEPENDENT_AMBULATORY_CARE_PROVIDER_SITE_OTHER): Payer: Commercial Managed Care - HMO | Admitting: Neurology

## 2015-01-14 ENCOUNTER — Encounter: Payer: Self-pay | Admitting: Neurology

## 2015-01-14 VITALS — BP 126/80 | HR 67 | Ht 71.0 in | Wt 182.5 lb

## 2015-01-14 DIAGNOSIS — G20 Parkinson's disease: Secondary | ICD-10-CM

## 2015-01-14 DIAGNOSIS — R413 Other amnesia: Secondary | ICD-10-CM | POA: Diagnosis not present

## 2015-01-14 DIAGNOSIS — R269 Unspecified abnormalities of gait and mobility: Secondary | ICD-10-CM

## 2015-01-14 MED ORDER — TRIHEXYPHENIDYL HCL 2 MG PO TABS: ORAL_TABLET | ORAL | Status: DC

## 2015-01-14 MED ORDER — CARBIDOPA-LEVODOPA 25-100 MG PO TABS: 0.5000 | ORAL_TABLET | Freq: Two times a day (BID) | ORAL | Status: DC

## 2015-01-14 NOTE — Patient Instructions (Addendum)
We will taper off of Artane (trihexyphenidyl), we will convert to the 2 mg tablets, taking 1 tablet 3 times daily for 2 weeks, 1 tablet twice daily for 2 weeks, then one tablet daily for 2 weeks, then stop. 2 weeks from now, start the Sinemet (carbidopa) taking the 25/100 milligrams tablet, one half tablet twice daily. Please call if any side effects occur, look out for drowsiness, confusion, dizziness, or nausea.  Parkinson Disease Parkinson disease is a disorder of the central nervous system, which includes the brain and spinal cord. A person with this disease slowly loses the ability to completely control body movements. Within the brain, there is a group of nerve cells (basal ganglia) that help control movement. The basal ganglia are damaged and do not work properly in a person with Parkinson disease. In addition, the basal ganglia produce and use a brain chemical called dopamine. The dopamine chemical sends messages to other parts of the body to control and coordinate body movements. Dopamine levels are low in a person with Parkinson disease. If the dopamine levels are low, then the body does not receive the correct messages it needs to move normally.  CAUSES  The exact reason why the basal ganglia get damaged is not known. Some medical researchers have thought that infection, genes, environment, and certain medicines may contribute to the cause.  SYMPTOMS   An early symptom of Parkinson disease is often an uncontrolled shaking (tremor) of the hands. The tremor will often disappear when the affected hand is consciously used.  As the disease progresses, walking, talking, getting out of a chair, and new movements become more difficult.  Muscles get stiff and movements become slower.  Balance and coordination become harder.  Depression, trouble swallowing, urinary problems, constipation, and sleep problems can occur.  Later in the disease, memory and thought processes may  deteriorate. DIAGNOSIS  There are no specific tests to diagnose Parkinson disease. You may be referred to a neurologist for evaluation. Your caregiver will ask about your medical history, symptoms, and perform a physical exam. Blood tests and imaging tests of your brain may be performed to rule out other diseases. The imaging tests may include an MRI or a CT scan. TREATMENT  The goal of treatment is to relieve symptoms. Medicines may be prescribed once the symptoms become troublesome. Medicine will not stop the progression of the disease, but medicine can make movement and balance better and help control tremors. Speech and occupational therapy may also be prescribed. Sometimes, surgical treatment of the brain can be done in young people. HOME CARE INSTRUCTIONS  Get regular exercise and rest periods during the day to help prevent exhaustion and depression.  If getting dressed becomes difficult, replace buttons and zippers with Velcro and elastic on your clothing.  Take all medicine as directed by your caregiver.  Install grab bars or railings in your home to prevent falls.  Go to speech or occupational therapy as directed.  Keep all follow-up visits as directed by your caregiver. SEEK MEDICAL CARE IF:  Your symptoms are not controlled with your medicine.  You fall.  You have trouble swallowing or choke on your food. MAKE SURE YOU:  Understand these instructions.  Will watch your condition.  Will get help right away if you are not doing well or get worse. Document Released: 05/01/2000 Document Revised: 08/29/2012 Document Reviewed: 06/03/2011 [facility] Patient Information 2015 [place], [place]. This information is not intended to replace advice given to you by your health care provider.  Make sure you discuss any questions you have with your health care provider.  

## 2015-01-14 NOTE — Progress Notes (Signed)
Reason for visit: Parkinson's disease  Donald [name] is an 74 y.o. male  History of present illness:  Donald [name] is a 74 year old left-handed white male with a history of Parkinson's disease primarily with right-sided features. The patient has also developed a memory issue, and since last seen, the wife indicates that his speech has been somewhat hypophonic, whispery at times. He has had occasional episode of choking when eating. He has been quite mobile, he walks regularly 2 or 3 miles a day. He goes to the [facility] and takes Parkinson's movement classes. He denies any falls. He has no significant issues getting up out of a chair, or walking across a room. The patient denies any dyskinesias or freezing episodes. He sleeps well at night, he denies any vivid dreams. He does remain hard of hearing. He returns this office for an evaluation.  Past Medical History  Diagnosis Date  . Hyperlipidemia   . Hypertension   . Aortic stenosis     s/p AVR by Dr Laneta Simmers  . Hyperthyroidism   . Hiatal hernia   . Right kidney stone     hx of  . Sick sinus syndrome     s/p PPM (MDT) 2006  . Atrial fibrillation     paroxysmal  . H/O: rheumatic fever   . CAD (coronary artery disease)     s/p CABG 2006  . Parkinson's disease   . Hearing deficit     Bilateral hearing aids  . Shortness of breath   . Dysrhythmia     ATRIAL FIBRILATION  . Pacemaker   . GERD (gastroesophageal reflux disease)   . Abnormality of gait 08/27/2014  . Memory difficulty 08/27/2014    Past Surgical History  Procedure Laterality Date  . Insert / replace / remove pacemaker  2006, 2012    MDT for sick sinus syndrome, gen change 8/12 by JA  . Coronary artery bypass graft  2006  . Aortic valve replacement  2006  . Kidney stone surgery    . Cholecystectomy N/A 07/21/2013    Procedure: LAPAROSCOPIC CHOLECYSTECTOMY;  Surgeon: Mariella Saa, MD;  Location: [facility] OR;  Service: General;  Laterality: N/A;  . Cardioversion N/A  09/14/2013    Procedure: CARDIOVERSION;  Surgeon: Thurmon Fair, MD;  Location: [facility] ENDOSCOPY;  Service: Cardiovascular;  Laterality: N/A;  . Cardioversion  09-14-13  . Cholescystectomy  09-14-13  . Dental surgery      bridge    Family History  Problem Relation Age of Onset  . Coronary artery disease      family hx of  . Dementia Mother   . Parkinsonism Mother   . Heart disease Father   . Heart disease Sister   . Heart disease Brother   . Tremor Brother     Unilateral tremor    Social history:  reports that he has never smoked. He has never used smokeless tobacco. He reports that he does not drink alcohol or use illicit drugs.    Allergies  Allergen Reactions  . Sulfa Antibiotics     Medications:  Prior to Admission medications   Medication Sig Start Date End Date Taking? Authorizing Provider  aspirin 81 MG tablet Take 81 mg by mouth daily.     Yes Historical Provider, MD  atorvastatin (LIPITOR) 20 MG tablet TAKE 1 TABLET EVERY DAY 11/28/14  Yes Hillis Range, MD  levothyroxine (SYNTHROID, LEVOTHROID) 125 MCG tablet Take 125 mcg by mouth daily.     Yes Historical Provider,  MD  metoprolol succinate (TOPROL-XL) 25 MG 24 hr tablet TAKE 1 TABLET EVERY DAY 12/28/14  Yes Hillis Range, MD  nitroGLYCERIN (NITROSTAT) 0.4 MG SL tablet Place 0.4 mg under the tongue every 5 (five) minutes as needed for chest pain.   Yes Historical Provider, MD  rOPINIRole (REQUIP) 3 MG tablet TAKE 1 TABLET THREE TIMES DAILY (DOSE INCREASE) 12/27/14  Yes York Spaniel, MD  sodium fluoride (FLUORISHIELD) 1.1 % GEL dental gel Place 1 application onto teeth at bedtime.   Yes Historical Provider, MD  Triamcinolone Acetonide (NASACORT ALLERGY 24HR NA) Place into the nose daily.   Yes Historical Provider, MD  warfarin (COUMADIN) 5 MG tablet TAKE AS DIRECTED BY ANTICOAGULATION CLINIC 12/28/14  Yes Vesta Mixer, MD  carbidopa-levodopa (SINEMET) 25-100 MG per tablet Take 0.5 tablets by mouth 2 (two) times daily.  01/14/15   York Spaniel, MD  trihexyphenidyl (ARTANE) 2 MG tablet One tablet 3 times a day for 2 weeks, then take 1 tablet twice daily for 2 weeks, then take 1 tablet daily for 2 weeks, then stop 01/14/15   York Spaniel, MD    ROS:  Out of a complete 14 system review of symptoms, the patient complains only of the following symptoms, and all other reviewed systems are negative.  Tremor Memory disturbance  Blood pressure 126/80, pulse 67, height 5\' 11"  (1.803 m), weight 182 lb 8 oz (82.781 kg).  Physical Exam  General: The patient is alert and cooperative at the time of the examination.  Skin: No significant peripheral edema is noted.   Neurologic Exam  Mental status: The patient is alert and oriented x 2 at the time of the examination (not oriented to date). The Mini-Mental Status Examination done today shows a total score 23/30. The patient is able to name 14 animals in 30 seconds.   Cranial nerves: Facial symmetry is present. Speech is normal, no aphasia or dysarthria is noted. Extraocular movements are full. Visual fields are full. Masking of the face is seen. The patient is hard of hearing.  Motor: The patient has good strength in all 4 extremities.  Sensory examination: Soft touch sensation is symmetric on the face, arms, and legs.  Coordination: The patient has good finger-nose-finger and heel-to-shin bilaterally. Prominent resting tremors are noted on the right upper extremity.  Gait and station: The patient has the ability to arise from a seated position with arms crossed. Once up, the patient has decreased arm swing on the right, on the tremor with walking on the right. Some arm swing is seen on the left. Tandem gait is normal. Romberg is negative. No drift is seen.  Reflexes: Deep tendon reflexes are symmetric.   Assessment/Plan:  1. Parkinson's disease  2. Right sided resting tremor  3. Memory disturbance  The patient is having some issues with memory.  For this reason, the Artane will be tapered off, we will give a prescription for 2 mg tablet, tapering by 2 mg every 2 weeks until off the medication. We will start with 6 mg daily. He will have Sinemet added to the regimen taking the 25/100 mg tablets taking one half tablet twice daily, he will follow-up in 4 months. The family is to contact me if he is having issues with medication side effects. The tremor may worsen coming off of the Artane.  Marlan Palau MD 01/14/2015 1:24 PM  [facility] [address] [place], [place] [address]  Phone [phone] Fax [phone]

## 2015-01-29 ENCOUNTER — Ambulatory Visit (INDEPENDENT_AMBULATORY_CARE_PROVIDER_SITE_OTHER): Payer: Commercial Managed Care - HMO | Admitting: *Deleted

## 2015-01-29 DIAGNOSIS — I350 Nonrheumatic aortic (valve) stenosis: Secondary | ICD-10-CM | POA: Diagnosis not present

## 2015-01-29 DIAGNOSIS — I359 Nonrheumatic aortic valve disorder, unspecified: Secondary | ICD-10-CM

## 2015-01-29 DIAGNOSIS — Z5181 Encounter for therapeutic drug level monitoring: Secondary | ICD-10-CM

## 2015-01-29 LAB — POCT INR: INR: 3.3

## 2015-02-01 ENCOUNTER — Telehealth: Payer: Self-pay | Admitting: Neurology

## 2015-02-01 NOTE — Telephone Encounter (Signed)
I spoke with patient's wife. Patient started carbidopa/levodopa on past Tuesday and yesterday noticed dark/red colored urine. No pain or lightheadedness. Patient is still on Artane 2 mg twice a day. Dark urine can be a side effect of carbidopa/levodopa, but other causes must be considered as well.   I advised patient to stop carbidopa/levodopa for now and wait until urine color returns to normal. Also encouraged patient to follow-up with PCP or urgent care if dark/red color urine continues or increases as there may be other causes for this.  Patient and wife for monitor through the weekend. On Monday or Tuesday if urine has cleared back to normal, may consider restarting carbidopa/levodopa. I will for this message to Dr. Anne Hahn for review as well.  Suanne Marker, MD 02/01/2015, 1:14 PM Certified in Neurology, Neurophysiology and Neuroimaging  [facility] [address] [place], [place] [address] [phone]

## 2015-02-01 NOTE — Telephone Encounter (Signed)
Spoke with patient's wife and gave her Dr Richrd Humbles recommendations. She has other medication questions regarding a medication Mr Darco is tapering off of. Transferred her call to Dr Marjory Lies.

## 2015-02-01 NOTE — Telephone Encounter (Signed)
Patient's husband is calling regarding medication carbidopa-levodopa (SINEMET) 25-100 MG per tablet for the patient. The patient started taking the medication this past Tuesday and yesterday he noticed his urine is red. Is this normal?  Please call to discuss. Thank you.

## 2015-02-01 NOTE — Telephone Encounter (Signed)
Sometimes urine can get dark with carb/levo. He should stop carb/levo and see if urine clears up. Also, he should go to PCP or urgent care to see if there is a different cause of red urine. -VRP

## 2015-02-05 NOTE — Telephone Encounter (Signed)
I called patient, talk with the wife. The patient indicates that he urine has cleared up. The patient had a brownish discoloration to the urine, likely related to blood in the urine not the medication. The patient is on Coumadin, he is getting his pro time checked which is appropriate. He is to go back on his Parkinson's medications.

## 2015-02-05 NOTE — Telephone Encounter (Signed)
Patients wife called today with update which was requested by Dr Marjory Lies. Urine test on Friday and last dose of carbidopa/levodopa Friday am.Urine has cleared up currently, maybe a tinge of color. Found out yesterday might have a little blood in it, will go back to PCP tomorrow morning for urine test and INR.  Does she keep him off medication the carbidopa/levodopa? Also he is on last tapering of  trihexyphenidyl (ARTANE) 2 MG tablet  (down to 1 tab today)does he continue with that? Any changes at all with that medication. Please call and advise. She can be reached at [phone]

## 2015-02-08 ENCOUNTER — Other Ambulatory Visit: Payer: Self-pay | Admitting: Internal Medicine

## 2015-02-11 ENCOUNTER — Encounter: Payer: Self-pay | Admitting: Internal Medicine

## 2015-02-11 ENCOUNTER — Ambulatory Visit (INDEPENDENT_AMBULATORY_CARE_PROVIDER_SITE_OTHER): Payer: Commercial Managed Care - HMO | Admitting: *Deleted

## 2015-02-11 DIAGNOSIS — I495 Sick sinus syndrome: Secondary | ICD-10-CM

## 2015-02-15 ENCOUNTER — Telehealth: Payer: Self-pay | Admitting: Internal Medicine

## 2015-02-15 ENCOUNTER — Other Ambulatory Visit: Payer: Self-pay | Admitting: Neurology

## 2015-02-15 MED ORDER — CARBIDOPA-LEVODOPA 25-100 MG PO TABS: 0.5000 | ORAL_TABLET | Freq: Two times a day (BID) | ORAL | Status: DC

## 2015-02-15 NOTE — Telephone Encounter (Signed)
New Message  Pt wife calling to speak w/ Device concerning remote taht was sched for 9/26- pt wife stated they received message that transmission was unsuccessful. Please call back and assist

## 2015-02-15 NOTE — Progress Notes (Signed)
Remote pacemaker transmission.   

## 2015-02-15 NOTE — Telephone Encounter (Signed)
Informed pt wife that pt remote transmission was received. Pt wife verbalized understanding.

## 2015-02-19 ENCOUNTER — Other Ambulatory Visit: Payer: Self-pay | Admitting: Internal Medicine

## 2015-02-19 ENCOUNTER — Other Ambulatory Visit: Payer: Self-pay | Admitting: Cardiovascular Disease

## 2015-03-01 ENCOUNTER — Telehealth: Payer: Self-pay | Admitting: Neurology

## 2015-03-01 LAB — CUP PACEART REMOTE DEVICE CHECK
Battery Remaining Longevity: 117 mo
Brady Statistic AS VS Percent: 2 %
Implantable Lead Implant Date: 20060326
Implantable Lead Location: 753859
Implantable Lead Location: 753860
Implantable Lead Model: 5076
Implantable Lead Model: 5076
Lead Channel Pacing Threshold Amplitude: 0.625 V
Lead Channel Pacing Threshold Pulse Width: 0.4 ms
Lead Channel Sensing Intrinsic Amplitude: 11.2 mV
Lead Channel Setting Pacing Amplitude: 2 V
Lead Channel Setting Pacing Pulse Width: 0.4 ms
Lead Channel Setting Sensing Sensitivity: 5.6 mV
MDC IDC LEAD IMPLANT DT: 20060326
MDC IDC MSMT BATTERY IMPEDANCE: 209 Ohm
MDC IDC MSMT BATTERY VOLTAGE: 2.78 V
MDC IDC MSMT LEADCHNL RA IMPEDANCE VALUE: 501 Ohm
MDC IDC MSMT LEADCHNL RA PACING THRESHOLD AMPLITUDE: 0.625 V
MDC IDC MSMT LEADCHNL RA PACING THRESHOLD PULSEWIDTH: 0.4 ms
MDC IDC MSMT LEADCHNL RV IMPEDANCE VALUE: 434 Ohm
MDC IDC SESS DTM: 20160926110803
MDC IDC SET LEADCHNL RV PACING AMPLITUDE: 2.5 V
MDC IDC STAT BRADY AP VP PERCENT: 3 %
MDC IDC STAT BRADY AP VS PERCENT: 94 %
MDC IDC STAT BRADY AS VP PERCENT: 0 %

## 2015-03-01 MED ORDER — CLONAZEPAM 0.5 MG PO TABS
0.5000 mg | ORAL_TABLET | Freq: Every day | ORAL | Status: DC
Start: 2015-03-01 — End: 2015-03-05

## 2015-03-01 NOTE — Telephone Encounter (Signed)
I called the patient, talk with the wife. The tremors have gotten much more severe off of the Artane, but Artane cause confusion. I will try low-dose clonazepam to see if this is helpful, either watch out for drowsiness or increased gait instability.

## 2015-03-01 NOTE — Telephone Encounter (Signed)
Wife called to advise that since Dr. Anne HahnWillis took her husband off of trihexyphenidyl (ARTANE) 2 MG tablet and ever since stopping the medication his tremors have gotten worse and it's affecting his walking. He's having a hard time with it. Is there something else that Dr. Anne HahnWillis can prescribe? Please call to advise.

## 2015-03-05 NOTE — Addendum Note (Signed)
Addended by: Stephanie AcreWILLIS, CHARLES on: 03/05/2015 01:03 PM   Modules accepted: Orders, Medications

## 2015-03-05 NOTE — Telephone Encounter (Signed)
I called the patient's wife. She stated that the patient has been taking Klonopin since it was prescribed on 10/14 and has been staggering. He has not fallen. She would like to know if he can try another medication for his tremors.

## 2015-03-05 NOTE — Telephone Encounter (Signed)
I called patient. The clonazepam is not well tolerated, the patient is to try Benadryl 25 mg tablets, taking one half tablet 3 times daily, look out for drowsiness.

## 2015-03-05 NOTE — Telephone Encounter (Signed)
Wife called regarding medication clonazePAM (KLONOPIN) 0.5 MG tablet that Dr. Anne HahnWillis put him on. Wife states that husband is unable to take this medication. "It's messing with his balance. Is there something else he can try for tremors?" Please call wife on her cell phone [phone].

## 2015-03-12 ENCOUNTER — Ambulatory Visit (INDEPENDENT_AMBULATORY_CARE_PROVIDER_SITE_OTHER): Payer: Commercial Managed Care - HMO | Admitting: *Deleted

## 2015-03-12 DIAGNOSIS — Z5181 Encounter for therapeutic drug level monitoring: Secondary | ICD-10-CM | POA: Diagnosis not present

## 2015-03-12 DIAGNOSIS — I350 Nonrheumatic aortic (valve) stenosis: Secondary | ICD-10-CM

## 2015-03-12 DIAGNOSIS — I359 Nonrheumatic aortic valve disorder, unspecified: Secondary | ICD-10-CM | POA: Diagnosis not present

## 2015-03-12 LAB — POCT INR: INR: 3.2

## 2015-03-16 ENCOUNTER — Other Ambulatory Visit: Payer: Self-pay | Admitting: Internal Medicine

## 2015-03-16 ENCOUNTER — Other Ambulatory Visit: Payer: Self-pay | Admitting: Cardiovascular Disease

## 2015-03-18 NOTE — Telephone Encounter (Signed)
Patient' s wife called in requesting that I call [employer] to correct the Rx on file.  The pharm tech stated that the medication was just shipped out 03/15/15 with the correct dosage.

## 2015-03-28 ENCOUNTER — Other Ambulatory Visit: Payer: Self-pay | Admitting: Internal Medicine

## 2015-03-28 ENCOUNTER — Telehealth: Payer: Self-pay | Admitting: Internal Medicine

## 2015-03-28 MED ORDER — ATORVASTATIN CALCIUM 20 MG PO TABS: 20.0000 mg | ORAL_TABLET | Freq: Every day | ORAL | Status: DC

## 2015-03-28 MED ORDER — METOPROLOL SUCCINATE ER 25 MG PO TB24: 25.0000 mg | ORAL_TABLET | Freq: Every day | ORAL | Status: DC

## 2015-03-28 NOTE — Telephone Encounter (Signed)
Pt's medication for Lipitor was sent to pt's pharmacy, confirmation received.

## 2015-03-28 NOTE — Telephone Encounter (Signed)
Pt's medication for lipitor was sent to his pharmacy confirmation received.

## 2015-03-28 NOTE — Telephone Encounter (Signed)
°*  STAT* If patient is at the pharmacy, call can be transferred to refill team.   1. Which medications need to be refilled? (please list name of each medication and dose if known) Atorvastatin 20 mg  2. Which pharmacy/location (including street and city if local pharmacy) is medication to be sent to? [facility]   3. Do they need a 30 day or 90 day supply? 90 day

## 2015-03-29 ENCOUNTER — Encounter: Payer: Self-pay | Admitting: Cardiology

## 2015-04-01 MED ORDER — CARBIDOPA-LEVODOPA 25-100 MG PO TABS: 1.0000 | ORAL_TABLET | Freq: Two times a day (BID) | ORAL | Status: DC

## 2015-04-01 NOTE — Telephone Encounter (Signed)
I called the patient, talk with the wife. The Benadryl was not helpful. We will try going up on Sinemet taking 25/100 mg tablets twice daily, treating the tremor without an anti-cold medication may be difficult, but this was going against the memory.

## 2015-04-01 NOTE — Telephone Encounter (Signed)
Wife called to advise Benadryl is not helping, wants to know if there is another route to go? Please call [phone].

## 2015-04-01 NOTE — Addendum Note (Signed)
Addended by: Stephanie AcreWILLIS, Jazmyne Beauchesne on: 04/01/2015 05:07 PM   Modules accepted: Orders, Medications

## 2015-04-02 ENCOUNTER — Telehealth: Payer: Self-pay | Admitting: Neurology

## 2015-04-02 NOTE — Telephone Encounter (Signed)
error 

## 2015-04-10 ENCOUNTER — Other Ambulatory Visit: Payer: Self-pay | Admitting: Internal Medicine

## 2015-04-19 ENCOUNTER — Telehealth: Payer: Self-pay | Admitting: Neurology

## 2015-04-19 NOTE — Telephone Encounter (Signed)
Wife called to advise recent increase in carbidopa-levodopa (SINEMET) 25-100 MG tablet has nothelped tremors. Wife is aware Dr. Anne HahnWillis is out of the office today, will return on Monday.

## 2015-04-19 NOTE — Telephone Encounter (Signed)
Wife wanted to cell number as alternate number to reach her at on Monday Cell [phone]

## 2015-04-22 NOTE — Telephone Encounter (Signed)
I called the wife. Unfortunately, Sinemet usually does not improve tremors with Parkinson's disease. He has a memory issue, can no longer take anti-cholinergic medications. He has not responded to Benadryl previously.

## 2015-04-23 ENCOUNTER — Ambulatory Visit (INDEPENDENT_AMBULATORY_CARE_PROVIDER_SITE_OTHER): Payer: Commercial Managed Care - HMO

## 2015-04-23 DIAGNOSIS — Z5181 Encounter for therapeutic drug level monitoring: Secondary | ICD-10-CM | POA: Diagnosis not present

## 2015-04-23 DIAGNOSIS — I359 Nonrheumatic aortic valve disorder, unspecified: Secondary | ICD-10-CM

## 2015-04-23 DIAGNOSIS — I350 Nonrheumatic aortic (valve) stenosis: Secondary | ICD-10-CM

## 2015-04-23 LAB — POCT INR: INR: 3.4

## 2015-04-23 MED ORDER — TRIHEXYPHENIDYL HCL 2 MG PO TABS: 2.0000 mg | ORAL_TABLET | Freq: Two times a day (BID) | ORAL | Status: DC

## 2015-04-23 NOTE — Telephone Encounter (Signed)
Wife returned call.

## 2015-04-23 NOTE — Addendum Note (Signed)
Addended by: Stephanie AcreWILLIS, Medardo Hassing on: 04/23/2015 05:28 PM   Modules accepted: Orders, Medications

## 2015-04-23 NOTE — Telephone Encounter (Signed)
I called the wife. The Sinemet did not help the tremor, this is not unusual. The anti-cholinergic medications as Artane may worsen memory, but may help the tremor. The wife once to try this again, we also discussed potentially getting a deep brain stimulator. The patient is on Coumadin, however. I will see them back in January. I will call in a small prescription for the Artane.

## 2015-05-23 ENCOUNTER — Encounter: Payer: Self-pay | Admitting: Neurology

## 2015-05-23 ENCOUNTER — Ambulatory Visit (INDEPENDENT_AMBULATORY_CARE_PROVIDER_SITE_OTHER): Payer: PPO | Admitting: Neurology

## 2015-05-23 VITALS — BP 121/68 | HR 65 | Resp 16 | Ht 71.0 in | Wt 193.0 lb

## 2015-05-23 DIAGNOSIS — R269 Unspecified abnormalities of gait and mobility: Secondary | ICD-10-CM

## 2015-05-23 DIAGNOSIS — R413 Other amnesia: Secondary | ICD-10-CM | POA: Diagnosis not present

## 2015-05-23 DIAGNOSIS — G20 Parkinson's disease: Secondary | ICD-10-CM

## 2015-05-23 MED ORDER — ROPINIROLE HCL 3 MG PO TABS: ORAL_TABLET | ORAL | Status: DC

## 2015-05-23 MED ORDER — TRIHEXYPHENIDYL HCL 2 MG PO TABS: 2.0000 mg | ORAL_TABLET | Freq: Two times a day (BID) | ORAL | Status: DC

## 2015-05-23 MED ORDER — CARBIDOPA-LEVODOPA 25-100 MG PO TABS: 1.0000 | ORAL_TABLET | Freq: Two times a day (BID) | ORAL | Status: DC

## 2015-05-23 NOTE — Progress Notes (Signed)
Reason for visit: Parkinson's disease  Donald [name] is an 75 y.o. male  History of present illness:  Donald [name] is a 75 year old left-handed white male with a history of Parkinson's disease primarily with right-sided features. The patient has prominent tremors involving the right arm and right leg. He has been on Artane taking 2 mg 3 times daily, but this was impairing his cognitive functioning. He was taken off the medication, but the tremor significantly worsened, and the tremor began affecting the jaw as well. He has been placed back on 2 mg twice daily, he has done better with the tremors and his memory has not been affected. The patient remains quite active, he works out on a regular basis, he denies any falls. He has some dysphonia with speech, he denies any problems with swallowing. He denies any significant balance issues. His memory has been stable. He returns to this office for an evaluation.  Past Medical History  Diagnosis Date  . Hyperlipidemia   . Hypertension   . Aortic stenosis     s/p AVR by Dr Laneta Simmers  . Hyperthyroidism   . Hiatal hernia   . Right kidney stone     hx of  . Sick sinus syndrome (HCC)     s/p PPM (MDT) 2006  . Atrial fibrillation (HCC)     paroxysmal  . H/O: rheumatic fever   . CAD (coronary artery disease)     s/p CABG 2006  . Parkinson's disease   . Hearing deficit     Bilateral hearing aids  . Shortness of breath   . Dysrhythmia     ATRIAL FIBRILATION  . Pacemaker   . GERD (gastroesophageal reflux disease)   . Abnormality of gait 08/27/2014  . Memory difficulty 08/27/2014    Past Surgical History  Procedure Laterality Date  . Insert / replace / remove pacemaker  2006, 2012    MDT for sick sinus syndrome, gen change 8/12 by JA  . Coronary artery bypass graft  2006  . Aortic valve replacement  2006  . Kidney stone surgery    . Cholecystectomy N/A 07/21/2013    Procedure: LAPAROSCOPIC CHOLECYSTECTOMY;  Surgeon: Mariella Saa, MD;   Location: [facility] OR;  Service: General;  Laterality: N/A;  . Cardioversion N/A 09/14/2013    Procedure: CARDIOVERSION;  Surgeon: Thurmon Fair, MD;  Location: [facility] ENDOSCOPY;  Service: Cardiovascular;  Laterality: N/A;  . Cardioversion  09-14-13  . Cholescystectomy  09-14-13  . Dental surgery      bridge    Family History  Problem Relation Age of Onset  . Coronary artery disease      family hx of  . Dementia Mother   . Parkinsonism Mother   . Heart disease Father   . Heart disease Sister   . Heart disease Brother   . Tremor Brother     Unilateral tremor    Social history:  reports that he has never smoked. He has never used smokeless tobacco. He reports that he does not drink alcohol or use illicit drugs.    Allergies  Allergen Reactions  . Sulfa Antibiotics     Medications:  Prior to Admission medications   Medication Sig Start Date End Date Taking? Authorizing Provider  aspirin 81 MG tablet Take 81 mg by mouth daily.      Historical Provider, MD  atorvastatin (LIPITOR) 20 MG tablet TAKE 1 TABLET EVERY DAY 04/10/15   Hillis Range, MD  carbidopa-levodopa (SINEMET) 25-100 MG tablet  Take 1 tablet by mouth 2 (two) times daily. 04/01/15   York Spanielharles K Willis, MD  esomeprazole (NEXIUM) 20 MG capsule Take 20 mg by mouth daily at 12 noon.    Historical Provider, MD  levothyroxine (SYNTHROID, LEVOTHROID) 125 MCG tablet Take 125 mcg by mouth daily.      Historical Provider, MD  metoprolol succinate (TOPROL-XL) 25 MG 24 hr tablet Take 1 tablet (25 mg total) by mouth daily. 03/28/15   Hillis RangeJames Allred, MD  nitroGLYCERIN (NITROSTAT) 0.4 MG SL tablet Place 0.4 mg under the tongue every 5 (five) minutes as needed for chest pain.    Historical Provider, MD  rOPINIRole (REQUIP) 3 MG tablet TAKE 1 TABLET THREE TIMES DAILY (DOSE INCREASE) 12/27/14   York Spanielharles K Willis, MD  sodium fluoride (FLUORISHIELD) 1.1 % GEL dental gel Place 1 application onto teeth at bedtime.    Historical Provider, MD  Triamcinolone  Acetonide (NASACORT ALLERGY 24HR NA) Place into the nose daily.    Historical Provider, MD  trihexyphenidyl (ARTANE) 2 MG tablet Take 1 tablet (2 mg total) by mouth 2 (two) times daily with a meal. 04/23/15   York Spanielharles K Willis, MD  warfarin (COUMADIN) 5 MG tablet TAKE AS DIRECTED BY ANTICOAGULATION CLINIC 12/28/14   Vesta MixerPhilip J Nahser, MD    ROS:  Out of a complete 14 system review of symptoms, the patient complains only of the following symptoms, and all other reviewed systems are negative.  Cough Heart murmur Tremor   Blood pressure 121/68, pulse 65, resp. rate 16, height 5\' 11"  (1.803 m), weight 193 lb (87.544 kg).  Physical Exam  General: The patient is alert and cooperative at the time of the examination.  Skin: No significant peripheral edema is noted.   Neurologic Exam  Mental status: The patient is alert and oriented x 3 at the time of the examination. The patient has apparent normal recent and remote memory, with an apparently normal attention span and concentration ability. Mini-Mental Status Examination done today shows a total score of 30/30. The patient is able to name 16 animals in 30 seconds.    Cranial nerves: Facial symmetry is present. Speech is dysphonic, not aphasic. Extraocular movements are full. The patient appears to have lid lag. Visual fields are full. Masking of the face is noted. The patient is hard of hearing.   Motor: The patient has good strength in all 4 extremities.  Sensory examination: Soft touch sensation is symmetric on the face, arms, and legs.  Coordination: The patient has good finger-nose-finger and heel-to-shin bilaterally. The patient appears to have some apraxia with the use of the extremities.   Gait and station: The patient is able to arise from a seated position with arms crossed. Once up, the patient walks independently, prominent tremors of the right arm are noted while walking, decreased arm swing on the right.  Tandem gait is slightly  unsteady. Romberg is negative. No drift is seen.  Reflexes: Deep tendon reflexes are symmetric.   Assessment/Plan:   1. Parkinson's disease  2. Mild memory disturbance  3. Right sided tremors  The patient is concerned about the ongoing chronic use of the Artane. The patient may be a candidate for a deep brain stimulator for treatment of the tremor and Parkinson's symptoms. The patient is amenable to getting an opinion through Dr. Arbutus Leasat for this purpose. I will make the referral. The patient otherwise will follow-up in 5 months. Prescriptions were sent in for the Requip, Sinemet, and Artane.  Donald Hamman. Keith  Anne Hahn MD 05/23/2015 6:24 PM  [facility] [address] [place], [place] [address]  Phone [phone] Fax [phone]

## 2015-05-23 NOTE — Patient Instructions (Signed)
Parkinson Disease Parkinson disease is a disorder of the central nervous system, which includes the brain and spinal cord. A person with this disease slowly loses the ability to completely control body movements. Within the brain, there is a group of nerve cells (basal ganglia) that help control movement. The basal ganglia are damaged and do not work properly in a person with Parkinson disease. In addition, the basal ganglia produce and use a brain chemical called dopamine. The dopamine chemical sends messages to other parts of the body to control and coordinate body movements. Dopamine levels are low in a person with Parkinson disease. If the dopamine levels are low, then the body does not receive the correct messages it needs to move normally.  CAUSES  The exact reason why the basal ganglia get damaged is not known. Some medical researchers have thought that infection, genes, environment, and certain medicines may contribute to the cause.  SYMPTOMS   An early symptom of Parkinson disease is often an uncontrolled shaking (tremor) of the hands. The tremor will often disappear when the affected hand is consciously used.  As the disease progresses, walking, talking, getting out of a chair, and new movements become more difficult.  Muscles get stiff and movements become slower.  Balance and coordination become harder.  Depression, trouble swallowing, urinary problems, constipation, and sleep problems can occur.  Later in the disease, memory and thought processes may deteriorate. DIAGNOSIS  There are no specific tests to diagnose Parkinson disease. You may be referred to a neurologist for evaluation. Your caregiver will ask about your medical history, symptoms, and perform a physical exam. Blood tests and imaging tests of your brain may be performed to rule out other diseases. The imaging tests may include an MRI or a CT scan. TREATMENT  The goal of treatment is to relieve symptoms. Medicines may be  prescribed once the symptoms become troublesome. Medicine will not stop the progression of the disease, but medicine can make movement and balance better and help control tremors. Speech and occupational therapy may also be prescribed. Sometimes, surgical treatment of the brain can be done in young people. HOME CARE INSTRUCTIONS  Get regular exercise and rest periods during the day to help prevent exhaustion and depression.  If getting dressed becomes difficult, replace buttons and zippers with Velcro and elastic on your clothing.  Take all medicine as directed by your caregiver.  Install grab bars or railings in your home to prevent falls.  Go to speech or occupational therapy as directed.  Keep all follow-up visits as directed by your caregiver. SEEK MEDICAL CARE IF:  Your symptoms are not controlled with your medicine.  You fall.  You have trouble swallowing or choke on your food. MAKE SURE YOU:  Understand these instructions.  Will watch your condition.  Will get help right away if you are not doing well or get worse.   This information is not intended to replace advice given to you by your health care provider. Make sure you discuss any questions you have with your health care provider.   Document Released: 05/01/2000 Document Revised: 08/29/2012 Document Reviewed: 06/03/2011 Elsevier Interactive Patient Education 2016 [employer]  

## 2015-05-29 ENCOUNTER — Encounter: Payer: Self-pay | Admitting: Internal Medicine

## 2015-05-29 ENCOUNTER — Ambulatory Visit (INDEPENDENT_AMBULATORY_CARE_PROVIDER_SITE_OTHER): Payer: PPO | Admitting: *Deleted

## 2015-05-29 ENCOUNTER — Ambulatory Visit (INDEPENDENT_AMBULATORY_CARE_PROVIDER_SITE_OTHER): Payer: PPO | Admitting: Internal Medicine

## 2015-05-29 VITALS — BP 136/82 | HR 82 | Ht 71.0 in | Wt 193.1 lb

## 2015-05-29 DIAGNOSIS — Z5181 Encounter for therapeutic drug level monitoring: Secondary | ICD-10-CM

## 2015-05-29 DIAGNOSIS — I350 Nonrheumatic aortic (valve) stenosis: Secondary | ICD-10-CM | POA: Diagnosis not present

## 2015-05-29 DIAGNOSIS — I359 Nonrheumatic aortic valve disorder, unspecified: Secondary | ICD-10-CM

## 2015-05-29 DIAGNOSIS — I480 Paroxysmal atrial fibrillation: Secondary | ICD-10-CM | POA: Diagnosis not present

## 2015-05-29 DIAGNOSIS — I495 Sick sinus syndrome: Secondary | ICD-10-CM | POA: Diagnosis not present

## 2015-05-29 DIAGNOSIS — Z950 Presence of cardiac pacemaker: Secondary | ICD-10-CM

## 2015-05-29 LAB — CUP PACEART INCLINIC DEVICE CHECK
Battery Remaining Longevity: 113 mo
Brady Statistic AP VS Percent: 95 %
Brady Statistic AS VP Percent: 0 %
Implantable Lead Implant Date: 20060326
Lead Channel Impedance Value: 427 Ohm
Lead Channel Pacing Threshold Amplitude: 0.75 V
Lead Channel Pacing Threshold Pulse Width: 0.4 ms
Lead Channel Pacing Threshold Pulse Width: 0.4 ms
Lead Channel Setting Pacing Amplitude: 2 V
Lead Channel Setting Pacing Amplitude: 2.5 V
Lead Channel Setting Pacing Pulse Width: 0.4 ms
Lead Channel Setting Sensing Sensitivity: 5.6 mV
MDC IDC LEAD IMPLANT DT: 20060326
MDC IDC LEAD LOCATION: 753859
MDC IDC LEAD LOCATION: 753860
MDC IDC MSMT BATTERY IMPEDANCE: 233 Ohm
MDC IDC MSMT BATTERY VOLTAGE: 2.78 V
MDC IDC MSMT LEADCHNL RA IMPEDANCE VALUE: 486 Ohm
MDC IDC MSMT LEADCHNL RA PACING THRESHOLD AMPLITUDE: 0.5 V
MDC IDC MSMT LEADCHNL RA SENSING INTR AMPL: 2.8 mV
MDC IDC MSMT LEADCHNL RV SENSING INTR AMPL: 15.67 mV
MDC IDC SESS DTM: 20170111083742
MDC IDC STAT BRADY AP VP PERCENT: 3 %
MDC IDC STAT BRADY AS VS PERCENT: 2 %

## 2015-05-29 LAB — POCT INR: INR: 4.6

## 2015-05-29 MED ORDER — NITROGLYCERIN 0.4 MG SL SUBL: 0.4000 mg | SUBLINGUAL_TABLET | SUBLINGUAL | Status: DC | PRN

## 2015-05-29 MED ORDER — ATORVASTATIN CALCIUM 20 MG PO TABS: 20.0000 mg | ORAL_TABLET | Freq: Every day | ORAL | Status: DC

## 2015-05-29 NOTE — Patient Instructions (Signed)
Medication Instructions:  Your physician recommends that you continue on your current medications as directed. Please refer to the Current Medication list given to you today.   Labwork: None ordered   Testing/Procedures: None ordered   Follow-Up: Remote monitoring is used to monitor your Pacemaker  from home. This monitoring reduces the number of office visits required to check your device to one time per year. It allows us to keep an eye on the functioning of your device to ensure it is working properly. You are scheduled for a device check from home on 08/28/15. You may send your transmission at any time that day. If you have a wireless device, the transmission will be sent automatically. After your physician reviews your transmission, you will receive a postcard with your next transmission date.   Your physician wants you to follow-up in: 6 months with Norma FredricksonLori Gerhardt, NP and 12 months with Dr Jacquiline DoeAllred You will receive a reminder letter in the mail two months in advance. If you don't receive a letter, please call our office to schedule the follow-up appointment.   Any Other Special Instructions Will Be Listed Below (If Applicable).     If you need a refill on your cardiac medications before your next appointment, please call your pharmacy.

## 2015-05-29 NOTE — Progress Notes (Signed)
PCP: Donald Garbe, MD Neurologist: Donald [name] is a 75 y.o. male who presents today for electrophysiology followup.  He remains active.  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  His parkinson's has advanced some.  He is seeing Donald [name] about a neuro stimulator soon.  Today, he denies symptoms of palpitations, chest pain, shortness of breath,  dizziness, presyncope, or syncope.  + chronic mild R leg swelling.  The patient is otherwise without complaint today.   Past Medical History  Diagnosis Date  . Hyperlipidemia   . Hypertension   . Aortic stenosis     s/p AVR by Donald Donald [name]  . Hyperthyroidism   . Hiatal hernia   . Right kidney stone     hx of  . Sick sinus syndrome (HCC)     s/p PPM (Donald [name]) 2006  . Atrial fibrillation (HCC)     paroxysmal  . H/O: rheumatic fever   . CAD (coronary artery disease)     s/p CABG 2006  . Parkinson's disease   . Hearing deficit     Bilateral hearing aids  . Shortness of breath   . Dysrhythmia     ATRIAL FIBRILATION  . Pacemaker   . GERD (gastroesophageal reflux disease)   . Abnormality of gait 08/27/2014  . Memory difficulty 08/27/2014   Past Surgical History  Procedure Laterality Date  . Insert / replace / remove pacemaker  2006, 2012    Donald [name] for sick sinus syndrome, gen change 8/12 by Donald [name]  . Coronary artery bypass graft  2006  . Aortic valve replacement  2006  . Kidney stone surgery    . Cholecystectomy N/A 07/21/2013    Procedure: LAPAROSCOPIC CHOLECYSTECTOMY;  Surgeon: Donald Saa, MD;  Location: [facility] OR;  Service: General;  Laterality: N/A;  . Cardioversion N/A 09/14/2013    Procedure: CARDIOVERSION;  Surgeon: Donald Fair, MD;  Location: [facility];  Service: Cardiovascular;  Laterality: N/A;  . Cardioversion  09-14-13  . Cholescystectomy  09-14-13  . Dental surgery      bridge    Current Outpatient Prescriptions  Medication Sig Dispense Refill  . aspirin 81 MG tablet Take 81 mg by mouth  daily.      Marland Kitchen atorvastatin (LIPITOR) 20 MG tablet Take 20 mg by mouth daily.    . carbidopa-levodopa (SINEMET) 25-100 MG tablet Take 1 tablet by mouth 2 (two) times daily. 180 tablet 1  . esomeprazole (NEXIUM) 20 MG capsule Take 20 mg by mouth daily at 12 noon.    Marland Kitchen levothyroxine (SYNTHROID, LEVOTHROID) 125 MCG tablet Take 125 mcg by mouth daily.      . metoprolol succinate (TOPROL-XL) 25 MG 24 hr tablet Take 1 tablet (25 mg total) by mouth daily. 90 tablet 1  . nitroGLYCERIN (NITROSTAT) 0.4 MG SL tablet Place 1 tablet (0.4 mg total) under the tongue every 5 (five) minutes x 3 doses as needed for chest pain. 25 tablet 6  . rOPINIRole (REQUIP) 3 MG tablet Take 3 mg by mouth 3 (three) times daily. (DOSE INCREASE)    . Triamcinolone Acetonide (NASACORT ALLERGY 24HR NA) Place 2 sprays into the nose daily.     . trihexyphenidyl (ARTANE) 2 MG tablet Take 1 tablet (2 mg total) by mouth 2 (two) times daily with a meal. 180 tablet 1  . warfarin (COUMADIN) 5 MG tablet TAKE AS DIRECTED BY ANTICOAGULATION CLINIC 135 tablet 1   No current facility-administered medications for this visit.  ROS- all systems are reviewed and negative except as per HPI above  Physical Exam: Filed Vitals:   05/29/15 0745  BP: 136/82  Pulse: 82  Height: 5\' 11"  (1.803 m)  Weight: 193 lb 1.9 oz (87.599 kg)    GEN- The patient is well appearing, alert and oriented x 3 today.   Head- normocephalic, atraumatic Eyes-  Sclera clear, conjunctiva pink Ears- hearing intact Oropharynx- clear Lungs- Clear to ausculation bilaterally, normal work of breathing Chest- pacemaker pocket is well healed Heart- regular rate and rhythm, mechanical S2 GI- soft, NT, ND, + BS Extremities- no clubbing, cyanosis, 1+ R leg edema  Pacemaker interrogation- reviewed in detail today,  See PACEART report  Assessment and Plan:  1.  Paroxysmal atrial fibrillation Chronically anticoagulated with coumadin (mechanical valve) Well controlled, (AF  burden by PPM is 1.7%),  no changes today Continue coumadin  2. Sick sinus syndrome Normal pacemaker function See Pace Art report No changes today  3.  Aortic Stenosis S/p AVR 2006  3.  Hypertension Stable  No changes today  Follow-up with Donald [name] in 6 months [facility] I will see in a year   Donald RangeJames Lilja Soland MD 05/29/2015 8:23 AM

## 2015-05-30 ENCOUNTER — Ambulatory Visit: Payer: Commercial Managed Care - HMO | Admitting: Neurology

## 2015-06-03 ENCOUNTER — Telehealth: Payer: Self-pay | Admitting: Neurology

## 2015-06-03 NOTE — Telephone Encounter (Signed)
Donald LappingBarbara Orzel called to advise patient received [facility] from [facility] notifying patient of temporary 30 day supply of trihexyphenidyl (ARTANE) 2 MG tablet due to needing Prior Authorization.

## 2015-06-03 NOTE — Telephone Encounter (Signed)
Ins has been contacted and provided with clinical info.  Request is under review Ref # [id number]  [employer] [facility]) has approved the request for coverage on Trihexyphenadyl effective until 05/17/2016

## 2015-06-11 ENCOUNTER — Ambulatory Visit (INDEPENDENT_AMBULATORY_CARE_PROVIDER_SITE_OTHER): Payer: PPO | Admitting: *Deleted

## 2015-06-11 DIAGNOSIS — I359 Nonrheumatic aortic valve disorder, unspecified: Secondary | ICD-10-CM | POA: Diagnosis not present

## 2015-06-11 DIAGNOSIS — I350 Nonrheumatic aortic (valve) stenosis: Secondary | ICD-10-CM

## 2015-06-11 DIAGNOSIS — Z5181 Encounter for therapeutic drug level monitoring: Secondary | ICD-10-CM

## 2015-06-11 LAB — POCT INR: INR: 3.9

## 2015-06-12 ENCOUNTER — Ambulatory Visit (INDEPENDENT_AMBULATORY_CARE_PROVIDER_SITE_OTHER): Payer: PPO | Admitting: Neurology

## 2015-06-12 ENCOUNTER — Encounter: Payer: Self-pay | Admitting: Neurology

## 2015-06-12 VITALS — BP 118/74 | HR 54 | Ht 71.0 in | Wt 196.0 lb

## 2015-06-12 DIAGNOSIS — G20 Parkinson's disease: Secondary | ICD-10-CM | POA: Diagnosis not present

## 2015-06-12 DIAGNOSIS — G20A1 Parkinson's disease without dyskinesia, without mention of fluctuations: Secondary | ICD-10-CM

## 2015-06-12 DIAGNOSIS — R413 Other amnesia: Secondary | ICD-10-CM

## 2015-06-12 DIAGNOSIS — H9193 Unspecified hearing loss, bilateral: Secondary | ICD-10-CM

## 2015-06-12 NOTE — Progress Notes (Signed)
Donald [name] was seen today in the movement disorders clinic for neurologic consultation at the request of Dr. Anne Hahn.  His PCP is Gaspar Garbe, MD. The patient presents today for consultation to see if he is a DBS candidate for Parkinson's disease.  I have reviewed numerous records made available to me.  The first neurology records that I have available to me are from May, 2014, but the patient has been seeing neurology since approximately 08/2010.  The patient notes that his first symptom was right sided tremor and tremor has persisted and been very medication resistant.   Tremor initially started in the R hand and then involved the R leg and now the jaw.  Never has tremor been on the L.   He has been on and off and back on Artane for this.  It was felt that Artane affected his memory and that is why it was discontinued, but the patient felt that this was really the only thing that helped his tremor and so he went back on it and is currently on 2 mg bid.  He has also tried clonazepam for tremor, but this caused staggering and he felt "helpless" on it.  This was at the end of 2016.  He called back and was told to try Benadryl but this did not help his tremor.  Ropinirole was started in approximately 2014 and he is currently on 3mg  tid.  Carbidopa/levodopa was added to his medication regimen on 01/14/2015.  The patient does not think that this medication has helped his tremor.  He is on 1 tablet bid (6-7am, 2pm).  He has never been on a higher dosage than this.  The patient has a long and extensive medical history.  In addition to Parkinson's disease, the patient has a history of aortic stenosis with aortic valve replacement, sick sinus syndrome with permanent pacemaker placement, atrial fibrillation, status post coronary artery bypass graft and history of rheumatic fever.  He is currently anticoagulated with Coumadin.  His wife does state that they talk to his cardiologist in preparation for his  appointment today.  He was told that his pacemaker wires are MRI compatible, although the pacemaker itself is not.  They were told that it could be replaced with a MRI compatible pacemaker, if we felt that that was absolutely necessary.   Specific Symptoms:  Tremor: Yes.   Voice: hypophonic Sleep: sleeps well  Vivid Dreams:  No.  Acting out dreams:  No. (some mumbling) Wet Pillows: Yes.   Postural symptoms:  Yes.    Falls?  No. Bradykinesia symptoms: shuffling gait, drooling while awake and difficulty getting out of a chair (drooling better with artane) Loss of smell:  No. Loss of taste:  No. Urinary Incontinence:  No. Difficulty Swallowing:  No. (rarely) Handwriting, micrographia: Yes.   (L hand dominant) Trouble with ADL's:  No.  Trouble buttoning clothing: No. Depression:  No. Memory changes:  Yes.   (attributes to medication - better when off artane but they don't think that got worse when recently restarted the medication) Hallucinations:  No.  visual distortions: No. N/V:  No. Lightheaded:  No.  Syncope: No. Diplopia:  No. Dyskinesia:  No.  Neuroimaging has not previously been performed.     ALLERGIES:   Allergies  Allergen Reactions  . Sulfa Antibiotics     unknown    CURRENT MEDICATIONS:  Outpatient Encounter Prescriptions as of 06/12/2015  Medication Sig  . aspirin 81 MG tablet Take 81 mg  by mouth daily.    Marland Kitchen atorvastatin (LIPITOR) 20 MG tablet Take 1 tablet (20 mg total) by mouth daily.  . carbidopa-levodopa (SINEMET) 25-100 MG tablet Take 1 tablet by mouth 2 (two) times daily.  Marland Kitchen esomeprazole (NEXIUM) 20 MG capsule Take 20 mg by mouth daily at 12 noon.  Marland Kitchen levothyroxine (SYNTHROID, LEVOTHROID) 125 MCG tablet Take 125 mcg by mouth daily.    . metoprolol succinate (TOPROL-XL) 25 MG 24 hr tablet Take 1 tablet (25 mg total) by mouth daily.  . nitroGLYCERIN (NITROSTAT) 0.4 MG SL tablet Place 1 tablet (0.4 mg total) under the tongue every 5 (five) minutes x 3 doses  as needed for chest pain.  Marland Kitchen rOPINIRole (REQUIP) 3 MG tablet Take 3 mg by mouth 3 (three) times daily. (DOSE INCREASE)  . Triamcinolone Acetonide (NASACORT ALLERGY 24HR NA) Place 2 sprays into the nose daily.   . trihexyphenidyl (ARTANE) 2 MG tablet Take 1 tablet (2 mg total) by mouth 2 (two) times daily with a meal.  . warfarin (COUMADIN) 5 MG tablet TAKE AS DIRECTED BY ANTICOAGULATION CLINIC   No facility-administered encounter medications on file as of 06/12/2015.    PAST MEDICAL HISTORY:   Past Medical History  Diagnosis Date  . Hyperlipidemia   . Hypertension   . Aortic stenosis     s/p AVR by Dr Laneta Simmers  . Hyperthyroidism   . Hiatal hernia   . Right kidney stone     hx of  . Sick sinus syndrome (HCC)     s/p PPM (MDT) 2006  . Atrial fibrillation (HCC)     paroxysmal  . H/O: rheumatic fever   . CAD (coronary artery disease)     s/p CABG 2006  . Parkinson's disease   . Hearing deficit     Bilateral hearing aids  . Shortness of breath   . Dysrhythmia     ATRIAL FIBRILATION  . Pacemaker   . GERD (gastroesophageal reflux disease)   . Abnormality of gait 08/27/2014  . Memory difficulty 08/27/2014    PAST SURGICAL HISTORY:   Past Surgical History  Procedure Laterality Date  . Insert / replace / remove pacemaker  2006, 2012    MDT for sick sinus syndrome, gen change 8/12 by JA  . Coronary artery bypass graft  2006  . Aortic valve replacement  2006  . Kidney stone surgery    . Cholecystectomy N/A 07/21/2013    Procedure: LAPAROSCOPIC CHOLECYSTECTOMY;  Surgeon: Mariella Saa, MD;  Location: [facility] OR;  Service: General;  Laterality: N/A;  . Cardioversion N/A 09/14/2013    Procedure: CARDIOVERSION;  Surgeon: Thurmon Fair, MD;  Location: [facility] ENDOSCOPY;  Service: Cardiovascular;  Laterality: N/A;  . Cardioversion  09-14-13  . Cholescystectomy  09-14-13  . Dental surgery      bridge    SOCIAL HISTORY:   Social History   Social History  . Marital Status: Married     Spouse Name: barbars  . Number of Children: 2  . Years of Education: HS   Occupational History  . Not on file.   Social History Main Topics  . Smoking status: Never Smoker   . Smokeless tobacco: Never Used  . Alcohol Use: No  . Drug Use: No  . Sexual Activity: Yes   Other Topics Concern  . Not on file   Social History Narrative   Patient is left handed.   Patient does not drink caffeine.    FAMILY HISTORY:   Family Status  Relation Status Death Age  . Mother Deceased 46  . Father Deceased 87  . Sister Deceased   . Brother Deceased   . Sister Alive   . Brother Deceased     Complications of alcoholism  . Brother Alive   . Brother Alive     ROS:  A complete 10 system review of systems was obtained and was unremarkable apart from what is mentioned above.  PHYSICAL EXAMINATION:    VITALS:   Filed Vitals:   06/12/15 1232  BP: 118/74  Pulse: 54  Height:  (1.803 m)  Weight: 196 lb (88.905 kg)  SpO2: 96%    GEN:  The patient appears stated age and is in NAD. HEENT:  Normocephalic, atraumatic.  The mucous membranes are moist. The superficial temporal arteries are without ropiness or tenderness. CV:  RRR Lungs:  CTAB Neck/HEME:  There are no carotid bruits bilaterally.  Neurological examination:  Orientation: MoCA done today and pt scored a 19/30.  Didn't even initially attempt clock as he was so frustrated with inability to do cube.  Went back after visit completed and had him draw clock and he was able to do it and that would make his MoCA score 22/30.   Unable to do trail making.  Is oriented to person, place, time. Cranial nerves: There is good facial symmetry.  There is significant facial hypomimia.  Pupils are equal round and reactive to light bilaterally. Fundoscopic exam is attempted but the disc margins are not well visualized bilaterally.  Extraocular muscles are intact. The visual fields are full to confrontational testing. The speech is fluent and  clear but it is hypophonic. Soft palate rises symmetrically and there is no tongue deviation. Hearing is markedly decreased to conversational tone, despite hearing aids. Sensation: Sensation is intact to light and pinprick throughout (facial, trunk, extremities). Vibration is decreased at the bilateral big toe. There is no extinction with double simultaneous stimulation. There is no sensory dermatomal level identified. Motor: Strength is 5/5 in the bilateral upper and lower extremities.   Shoulder shrug is equal and symmetric.  There is no pronator drift. Deep tendon reflexes: Deep tendon reflexes are 1/4 at the bilateral biceps, triceps, brachioradialis, patella and achilles. Plantar response is up on the left and down on the right.  Movement examination: Tone: There is mild to moderate increased tone in the right upper and lower extremity.  Tone on the left is normal. Abnormal movements: There is a moderate to severe right upper extremity resting tremor.  There is a mild intermittent right lower extremity resting tremor.  With distraction procedures only, a mild to moderate left upper extremity resting tremor becomes quite prominent. Coordination:  There is decremation with RAM's, seen with virtually all forms of rapid alternating movements, worse on the right than the left. Gait and Station: The patient has no difficulty arising out of a deep-seated chair without the use of the hands. The patient's stride length is minimally decreased with decreased arm swing on the right and an increase in the right upper extremity resting tremor.  The patient has a negative pull test.      ASSESSMENT/PLAN:  1.  Idiopathic tremor predominant Parkinson's disease, diagnosed in April, 2012.  -Long discussion with the patient and his wife.  I am not sure if he is a DBS candidate and there are several factors that need to be considered.  The first is his memory.  I am not sure if he has the onset of  dementia, or if  there is a significant influence from his medications (trihexyphenidyl and ropinirole) in addition to the fact that his hearing is so poor that this could could also play a role in memory testing.  He and I had a long discussion about this.  In addition, we would need to prove that levodopa was effective at least for rigidity and bradykinesia, even if it was not effective for tremor.  He would need a levodopa challenge test for this.  Finally, he has multiple medical problems that may prevent him from having surgery, primarily the fact that he is on Coumadin and has a permanent pacemaker and has had sick sinus syndrome as well as an aortic valve replacement.  In the end, he decided that he would at least like to proceed with the levodopa challenge, which I think is reasonable.  I will schedule him for that.  I made it clear to the patient that his regular care will continue to come from Dr. Anne Hahn and that this is purely a surgical evaluation.  He and his wife agreed with this.  They asked multiple questions and I answered them to the best of my ability.  I will have him hold his PD meds, including artane, prior to the levodopa challenge.  Much greater than 50% of this visit was spent in counseling with the patient and the family.  Total face to face time:  60 min

## 2015-06-12 NOTE — Patient Instructions (Signed)
1. We have you scheduled for your on/off test on 06/28/2015. Please do not take your Carbidopa Levodopa, Requip or Artane on this day.

## 2015-06-24 DIAGNOSIS — E038 Other specified hypothyroidism: Secondary | ICD-10-CM | POA: Diagnosis not present

## 2015-06-24 DIAGNOSIS — Z125 Encounter for screening for malignant neoplasm of prostate: Secondary | ICD-10-CM | POA: Diagnosis not present

## 2015-06-24 DIAGNOSIS — E7800 Pure hypercholesterolemia, unspecified: Secondary | ICD-10-CM | POA: Diagnosis not present

## 2015-06-25 ENCOUNTER — Ambulatory Visit (INDEPENDENT_AMBULATORY_CARE_PROVIDER_SITE_OTHER): Payer: PPO | Admitting: *Deleted

## 2015-06-25 DIAGNOSIS — I359 Nonrheumatic aortic valve disorder, unspecified: Secondary | ICD-10-CM | POA: Diagnosis not present

## 2015-06-25 DIAGNOSIS — Z5181 Encounter for therapeutic drug level monitoring: Secondary | ICD-10-CM | POA: Diagnosis not present

## 2015-06-25 DIAGNOSIS — I350 Nonrheumatic aortic (valve) stenosis: Secondary | ICD-10-CM | POA: Diagnosis not present

## 2015-06-25 LAB — POCT INR: INR: 3.2

## 2015-06-28 ENCOUNTER — Encounter: Payer: Self-pay | Admitting: Neurology

## 2015-06-28 ENCOUNTER — Ambulatory Visit (INDEPENDENT_AMBULATORY_CARE_PROVIDER_SITE_OTHER): Payer: PPO | Admitting: Neurology

## 2015-06-28 VITALS — BP 128/74 | HR 70 | Ht 71.0 in | Wt 195.0 lb

## 2015-06-28 DIAGNOSIS — G20 Parkinson's disease: Secondary | ICD-10-CM

## 2015-06-28 MED ORDER — CARBIDOPA-LEVODOPA 25-100 MG PO TABS
2.5000 | ORAL_TABLET | Freq: Once | ORAL | Status: AC
  Administered 2015-06-28: 2.5 via ORAL

## 2015-06-28 NOTE — Progress Notes (Signed)
Donald [name] was seen today in the movement disorders clinic for neurologic consultation at the request of Dr. Anne Hahn.  His PCP is Gaspar Garbe, MD. The patient presents today for consultation to see if he is a DBS candidate for Parkinson's disease.  I have reviewed numerous records made available to me.  The first neurology records that I have available to me are from May, 2014, but the patient has been seeing neurology since approximately 08/2010.  The patient notes that his first symptom was right sided tremor and tremor has persisted and been very medication resistant.   Tremor initially started in the R hand and then involved the R leg and now the jaw.  Never has tremor been on the L.   He has been on and off and back on Artane for this.  It was felt that Artane affected his memory and that is why it was discontinued, but the patient felt that this was really the only thing that helped his tremor and so he went back on it and is currently on 2 mg bid.  He has also tried clonazepam for tremor, but this caused staggering and he felt "helpless" on it.  This was at the end of 2016.  He called back and was told to try Benadryl but this did not help his tremor.  Ropinirole was started in approximately 2014 and he is currently on  tid.  Carbidopa/levodopa was added to his medication regimen on 01/14/2015.  The patient does not think that this medication has helped his tremor.  He is on 1 tablet bid (6-7am, 2pm).  He has never been on a higher dosage than this.  The patient has a long and extensive medical history.  In addition to Parkinson's disease, the patient has a history of aortic stenosis with aortic valve replacement, sick sinus syndrome with permanent pacemaker placement, atrial fibrillation, status post coronary artery bypass graft and history of rheumatic fever.  He is currently anticoagulated with Coumadin.  His wife does state that they talk to his cardiologist in preparation for his  appointment today.  He was told that his pacemaker wires are MRI compatible, although the pacemaker itself is not.  They were told that it could be replaced with a MRI compatible pacemaker, if we felt that that was absolutely necessary.   Specific Symptoms:  Tremor: Yes.   Voice: hypophonic Sleep: sleeps well  Vivid Dreams:  No.  Acting out dreams:  No. (some mumbling) Wet Pillows: Yes.   Postural symptoms:  Yes.    Falls?  No. Bradykinesia symptoms: shuffling gait, drooling while awake and difficulty getting out of a chair (drooling better with artane) Loss of smell:  No. Loss of taste:  No. Urinary Incontinence:  No. Difficulty Swallowing:  No. (rarely) Handwriting, micrographia: Yes.   (L hand dominant) Trouble with ADL's:  No.  Trouble buttoning clothing: No. Depression:  No. Memory changes:  Yes.   (attributes to medication - better when off artane but they don't think that got worse when recently restarted the medication) Hallucinations:  No.  visual distortions: No. N/V:  No. Lightheaded:  No.  Syncope: No. Diplopia:  No. Dyskinesia:  No.  Neuroimaging has not previously been performed.    06/28/15 update:  The patient presents today for UPDRS motor on/off testing.  He reports that he has been off of medication since about 9 PM yesterday.  He really feels no different.  He states that he may be just  a little more tremulous, but otherwise feels about the same.   ALLERGIES:   Allergies  Allergen Reactions  . Sulfa Antibiotics     unknown    CURRENT MEDICATIONS:  Outpatient Encounter Prescriptions as of 06/28/2015  Medication Sig  . aspirin 81 MG tablet Take 81 mg by mouth daily.    Marland Kitchen atorvastatin (LIPITOR) 20 MG tablet Take 1 tablet (20 mg total) by mouth daily.  . carbidopa-levodopa (SINEMET) 25-100 MG tablet Take 1 tablet by mouth 2 (two) times daily.  Marland Kitchen esomeprazole (NEXIUM) 20 MG capsule Take 20 mg by mouth daily at 12 noon.  Marland Kitchen levothyroxine (SYNTHROID,  LEVOTHROID) 125 MCG tablet Take 125 mcg by mouth daily.    . metoprolol succinate (TOPROL-XL) 25 MG 24 hr tablet Take 1 tablet (25 mg total) by mouth daily.  Marland Kitchen rOPINIRole (REQUIP) 3 MG tablet Take 3 mg by mouth 3 (three) times daily. (DOSE INCREASE)  . Triamcinolone Acetonide (NASACORT ALLERGY 24HR NA) Place 2 sprays into the nose daily.   . trihexyphenidyl (ARTANE) 2 MG tablet Take 1 tablet (2 mg total) by mouth 2 (two) times daily with a meal.  . warfarin (COUMADIN) 5 MG tablet TAKE AS DIRECTED BY ANTICOAGULATION CLINIC  . nitroGLYCERIN (NITROSTAT) 0.4 MG SL tablet Place 1 tablet (0.4 mg total) under the tongue every 5 (five) minutes x 3 doses as needed for chest pain. (Patient not taking: Reported on 06/28/2015)   No facility-administered encounter medications on file as of 06/28/2015.    PAST MEDICAL HISTORY:   Past Medical History  Diagnosis Date  . Hyperlipidemia   . Hypertension   . Aortic stenosis     s/p AVR by Dr Laneta Simmers  . Hyperthyroidism   . Hiatal hernia   . Right kidney stone     hx of  . Sick sinus syndrome (HCC)     s/p PPM (MDT) 2006  . Atrial fibrillation (HCC)     paroxysmal  . H/O: rheumatic fever   . CAD (coronary artery disease)     s/p CABG 2006  . Parkinson's disease   . Hearing deficit     Bilateral hearing aids  . Shortness of breath   . Dysrhythmia     ATRIAL FIBRILATION  . Pacemaker   . GERD (gastroesophageal reflux disease)   . Abnormality of gait 08/27/2014  . Memory difficulty 08/27/2014    PAST SURGICAL HISTORY:   Past Surgical History  Procedure Laterality Date  . Insert / replace / remove pacemaker  2006, 2012    MDT for sick sinus syndrome, gen change 8/12 by JA  . Coronary artery bypass graft  2006  . Aortic valve replacement  2006  . Kidney stone surgery    . Cholecystectomy N/A 07/21/2013    Procedure: LAPAROSCOPIC CHOLECYSTECTOMY;  Surgeon: Mariella Saa, MD;  Location: [facility] OR;  Service: General;  Laterality: N/A;  .  Cardioversion N/A 09/14/2013    Procedure: CARDIOVERSION;  Surgeon: Thurmon Fair, MD;  Location: [facility];  Service: Cardiovascular;  Laterality: N/A;  . Cardioversion  09-14-13  . Cholescystectomy  09-14-13  . Dental surgery      bridge    SOCIAL HISTORY:   Social History   Social History  . Marital Status: Married    Spouse Name: barbars  . Number of Children: 2  . Years of Education: HS   Occupational History  . Not on file.   Social History Main Topics  . Smoking status: Never Smoker   .  Smokeless tobacco: Never Used  . Alcohol Use: No  . Drug Use: No  . Sexual Activity: Yes   Other Topics Concern  . Not on file   Social History Narrative   Patient is left handed.   Patient does not drink caffeine.    FAMILY HISTORY:   Family Status  Relation Status Death Age  . Mother Deceased 21  . Father Deceased 42  . Sister Deceased   . Brother Deceased   . Sister Alive   . Brother Deceased     Complications of alcoholism  . Brother Alive   . Brother Alive     ROS:  A complete 10 system review of systems was obtained and was unremarkable apart from what is mentioned above.  PHYSICAL EXAMINATION:    VITALS:   Filed Vitals:   06/28/15 1408  BP: 128/74  Pulse: 70  Height: 5\' 11"  (1.803 m)  Weight: 195 lb (88.451 kg)    GEN:  The patient appears stated age and is in NAD. HEENT:  Normocephalic, atraumatic.  The mucous membranes are moist. The superficial temporal arteries are without ropiness or tenderness. CV:  RRR Lungs:  CTAB Neck/HEME:  There are no carotid bruits bilaterally.  Neurological examination:  Orientation: MoCA done last visit and pt scored a 19/30.  Didn't even initially attempt clock as he was so frustrated with inability to do cube.  Went back after visit completed and had him draw clock and he was able to do it and that would make his MoCA score 22/30.   Unable to do trail making.  Is oriented to person, place, time. Cranial nerves:  There is good facial symmetry.  There is significant facial hypomimia.  The visual fields are full to confrontational testing. The speech is fluent and clear but it is hypophonic. Soft palate rises symmetrically and there is no tongue deviation. Hearing is markedly decreased to conversational tone, despite hearing aids. Sensation: Sensation is intact to light touch throughout Motor: Strength is 5/5 in the bilateral upper and lower extremities.   Shoulder shrug is equal and symmetric.  There is no pronator drift.  A complete UPDRS motor off/on this one.  UPDRS motor off score was 22 and UPDRS motor on score was 17.  Tremor did not change.  It is important to note that the patient was not particularly rigid today, even off of medication.  ASSESSMENT/PLAN:  1.  Idiopathic tremor predominant Parkinson's disease, diagnosed in April, 2012.  -Long discussion with the patient and his wife.  The patient's tremor is levodopa nonresponsive.  That alone does not necessarily make him not a candidate for DBS, but the remainder of his symptoms did not improve significantly with levodopa.  This, along with the fact that he has numerous other medical problems, likely preclude him from DBS candidacy.  I talked to him and his wife about this today.  I think that he will be limited to medical therapy, unless focused ultrasound becomes approved in the future for Parkinson's disease, as it has for essential tremor.  They asked me several questions about his current Parkinson's medications, but I told them that I would leave these decisions between them and Dr. Anne Hahn.  Much greater than 50% of this visit was spent in counseling with the patient and the family.  Total face to face time:  60 min which did not include the 30 min "wait time" waiting for the levodopa to kick in.

## 2015-07-01 DIAGNOSIS — I119 Hypertensive heart disease without heart failure: Secondary | ICD-10-CM | POA: Diagnosis not present

## 2015-07-01 DIAGNOSIS — Z0000 Encounter for general adult medical examination without abnormal findings: Secondary | ICD-10-CM | POA: Diagnosis not present

## 2015-07-01 DIAGNOSIS — I25810 Atherosclerosis of coronary artery bypass graft(s) without angina pectoris: Secondary | ICD-10-CM | POA: Diagnosis not present

## 2015-07-01 DIAGNOSIS — R635 Abnormal weight gain: Secondary | ICD-10-CM | POA: Diagnosis not present

## 2015-07-01 DIAGNOSIS — L03039 Cellulitis of unspecified toe: Secondary | ICD-10-CM | POA: Diagnosis not present

## 2015-07-01 DIAGNOSIS — D692 Other nonthrombocytopenic purpura: Secondary | ICD-10-CM | POA: Diagnosis not present

## 2015-07-01 DIAGNOSIS — Z1389 Encounter for screening for other disorder: Secondary | ICD-10-CM | POA: Diagnosis not present

## 2015-07-01 DIAGNOSIS — N401 Enlarged prostate with lower urinary tract symptoms: Secondary | ICD-10-CM | POA: Diagnosis not present

## 2015-07-01 DIAGNOSIS — H900 Conductive hearing loss, bilateral: Secondary | ICD-10-CM | POA: Diagnosis not present

## 2015-07-01 DIAGNOSIS — I482 Chronic atrial fibrillation: Secondary | ICD-10-CM | POA: Diagnosis not present

## 2015-07-01 DIAGNOSIS — Z7901 Long term (current) use of anticoagulants: Secondary | ICD-10-CM | POA: Diagnosis not present

## 2015-07-01 DIAGNOSIS — Z950 Presence of cardiac pacemaker: Secondary | ICD-10-CM | POA: Diagnosis not present

## 2015-07-09 ENCOUNTER — Ambulatory Visit (INDEPENDENT_AMBULATORY_CARE_PROVIDER_SITE_OTHER): Payer: PPO

## 2015-07-09 DIAGNOSIS — Z5181 Encounter for therapeutic drug level monitoring: Secondary | ICD-10-CM

## 2015-07-09 DIAGNOSIS — I350 Nonrheumatic aortic (valve) stenosis: Secondary | ICD-10-CM

## 2015-07-09 DIAGNOSIS — I359 Nonrheumatic aortic valve disorder, unspecified: Secondary | ICD-10-CM | POA: Diagnosis not present

## 2015-07-09 LAB — POCT INR: INR: 2.5

## 2015-07-12 DIAGNOSIS — Z1212 Encounter for screening for malignant neoplasm of rectum: Secondary | ICD-10-CM | POA: Diagnosis not present

## 2015-07-30 ENCOUNTER — Ambulatory Visit (INDEPENDENT_AMBULATORY_CARE_PROVIDER_SITE_OTHER): Payer: PPO

## 2015-07-30 DIAGNOSIS — I350 Nonrheumatic aortic (valve) stenosis: Secondary | ICD-10-CM | POA: Diagnosis not present

## 2015-07-30 DIAGNOSIS — I359 Nonrheumatic aortic valve disorder, unspecified: Secondary | ICD-10-CM | POA: Diagnosis not present

## 2015-07-30 DIAGNOSIS — Z5181 Encounter for therapeutic drug level monitoring: Secondary | ICD-10-CM

## 2015-07-30 LAB — POCT INR: INR: 2.6

## 2015-08-29 ENCOUNTER — Ambulatory Visit (INDEPENDENT_AMBULATORY_CARE_PROVIDER_SITE_OTHER): Payer: PPO | Admitting: *Deleted

## 2015-08-29 DIAGNOSIS — I495 Sick sinus syndrome: Secondary | ICD-10-CM | POA: Diagnosis not present

## 2015-08-30 ENCOUNTER — Ambulatory Visit (INDEPENDENT_AMBULATORY_CARE_PROVIDER_SITE_OTHER): Payer: PPO | Admitting: *Deleted

## 2015-08-30 DIAGNOSIS — Z5181 Encounter for therapeutic drug level monitoring: Secondary | ICD-10-CM | POA: Diagnosis not present

## 2015-08-30 DIAGNOSIS — I350 Nonrheumatic aortic (valve) stenosis: Secondary | ICD-10-CM

## 2015-08-30 DIAGNOSIS — I359 Nonrheumatic aortic valve disorder, unspecified: Secondary | ICD-10-CM

## 2015-08-30 LAB — POCT INR: INR: 4.1

## 2015-09-02 NOTE — Progress Notes (Signed)
Remote pacemaker transmission.   

## 2015-09-03 ENCOUNTER — Telehealth: Payer: Self-pay | Admitting: Neurology

## 2015-09-03 MED ORDER — TRIHEXYPHENIDYL HCL 2 MG PO TABS: 2.0000 mg | ORAL_TABLET | Freq: Two times a day (BID) | ORAL | Status: DC

## 2015-09-03 MED ORDER — ROPINIROLE HCL 3 MG PO TABS: 3.0000 mg | ORAL_TABLET | Freq: Three times a day (TID) | ORAL | Status: DC

## 2015-09-03 MED ORDER — CARBIDOPA-LEVODOPA 25-100 MG PO TABS: 1.0000 | ORAL_TABLET | Freq: Two times a day (BID) | ORAL | Status: DC

## 2015-09-03 NOTE — Telephone Encounter (Signed)
Pt's wife called  requesting refill on trihexyphenidyl (ARTANE) 2 MG tablet, rOPINIRole (REQUIP) 3 MG tablet, carbidopa-levodopa (SINEMET) 25-100 MG tablet. [facility]

## 2015-09-03 NOTE — Telephone Encounter (Signed)
Refills sent in to [facility] as requested.

## 2015-09-13 ENCOUNTER — Ambulatory Visit (INDEPENDENT_AMBULATORY_CARE_PROVIDER_SITE_OTHER): Payer: PPO | Admitting: Pharmacist

## 2015-09-13 DIAGNOSIS — Z5181 Encounter for therapeutic drug level monitoring: Secondary | ICD-10-CM

## 2015-09-13 DIAGNOSIS — I350 Nonrheumatic aortic (valve) stenosis: Secondary | ICD-10-CM

## 2015-09-13 DIAGNOSIS — I359 Nonrheumatic aortic valve disorder, unspecified: Secondary | ICD-10-CM

## 2015-09-13 LAB — POCT INR: INR: 3.1

## 2015-10-08 ENCOUNTER — Encounter: Payer: Self-pay | Admitting: Cardiology

## 2015-10-08 LAB — CUP PACEART REMOTE DEVICE CHECK
Battery Remaining Longevity: 109 mo
Battery Voltage: 2.78 V
Date Time Interrogation Session: 20170413095619
Implantable Lead Implant Date: 20060326
Implantable Lead Model: 5076
Implantable Lead Model: 5076
Lead Channel Pacing Threshold Pulse Width: 0.4 ms
Lead Channel Setting Pacing Amplitude: 2 V
Lead Channel Setting Pacing Amplitude: 2.5 V
Lead Channel Setting Pacing Pulse Width: 0.4 ms
Lead Channel Setting Sensing Sensitivity: 5.6 mV
MDC IDC LEAD IMPLANT DT: 20060326
MDC IDC LEAD LOCATION: 753859
MDC IDC LEAD LOCATION: 753860
MDC IDC MSMT BATTERY IMPEDANCE: 258 Ohm
MDC IDC MSMT LEADCHNL RA IMPEDANCE VALUE: 479 Ohm
MDC IDC MSMT LEADCHNL RA PACING THRESHOLD AMPLITUDE: 0.625 V
MDC IDC MSMT LEADCHNL RA PACING THRESHOLD PULSEWIDTH: 0.4 ms
MDC IDC MSMT LEADCHNL RV IMPEDANCE VALUE: 448 Ohm
MDC IDC MSMT LEADCHNL RV PACING THRESHOLD AMPLITUDE: 0.5 V
MDC IDC MSMT LEADCHNL RV SENSING INTR AMPL: 11.2 mV
MDC IDC STAT BRADY AP VP PERCENT: 2 %
MDC IDC STAT BRADY AP VS PERCENT: 91 %
MDC IDC STAT BRADY AS VP PERCENT: 0 %
MDC IDC STAT BRADY AS VS PERCENT: 7 %

## 2015-10-11 ENCOUNTER — Ambulatory Visit (INDEPENDENT_AMBULATORY_CARE_PROVIDER_SITE_OTHER): Payer: PPO | Admitting: *Deleted

## 2015-10-11 DIAGNOSIS — I359 Nonrheumatic aortic valve disorder, unspecified: Secondary | ICD-10-CM | POA: Diagnosis not present

## 2015-10-11 DIAGNOSIS — I350 Nonrheumatic aortic (valve) stenosis: Secondary | ICD-10-CM

## 2015-10-11 DIAGNOSIS — Z5181 Encounter for therapeutic drug level monitoring: Secondary | ICD-10-CM

## 2015-10-11 LAB — POCT INR: INR: 3.1

## 2015-10-16 ENCOUNTER — Other Ambulatory Visit: Payer: Self-pay | Admitting: *Deleted

## 2015-10-16 MED ORDER — METOPROLOL SUCCINATE ER 25 MG PO TB24: 25.0000 mg | ORAL_TABLET | Freq: Every day | ORAL | Status: DC

## 2015-10-21 ENCOUNTER — Encounter: Payer: Self-pay | Admitting: Neurology

## 2015-10-21 ENCOUNTER — Ambulatory Visit (INDEPENDENT_AMBULATORY_CARE_PROVIDER_SITE_OTHER): Payer: PPO | Admitting: Neurology

## 2015-10-21 VITALS — BP 132/70 | HR 60 | Ht 70.5 in | Wt 198.5 lb

## 2015-10-21 DIAGNOSIS — R413 Other amnesia: Secondary | ICD-10-CM | POA: Diagnosis not present

## 2015-10-21 DIAGNOSIS — R269 Unspecified abnormalities of gait and mobility: Secondary | ICD-10-CM | POA: Diagnosis not present

## 2015-10-21 DIAGNOSIS — G20 Parkinson's disease: Secondary | ICD-10-CM | POA: Diagnosis not present

## 2015-10-21 MED ORDER — ROPINIROLE HCL 1 MG PO TABS: ORAL_TABLET | ORAL | Status: DC

## 2015-10-21 MED ORDER — CARBIDOPA-LEVODOPA 25-100 MG PO TABS: 1.0000 | ORAL_TABLET | Freq: Three times a day (TID) | ORAL | Status: DC

## 2015-10-21 NOTE — Progress Notes (Signed)
Reason for visit: Parkinson's disease  Donald [name] is an 75 y.o. male  History of present illness:  Donald [name] is a 75 year old left-handed white male with a history of Parkinson's disease associated with a tremor that is primarily right-sided. The patient has had some mild confusion on the Artane taking 2 mg 3 times daily, he was cut back to 2 mg twice daily, and this seemed to help the confusion. The tremor significantly worsened off of the medication. The patient is on Sinemet taking the 25/100 mg tablets twice daily, and he is on Requip taking 3 mg 3 times daily. The patient remains active, he has not had any falls his last seen. He was seen and evaluated by Dr. Arbutus Leas for a possible deep brain stimulator. He was not felt to be a candidate as he did not respond with the tremor to a dopamine challenge. He has not had any overt hallucinations. He is sleeping well at night, he denies any issues with swallowing. He returns to the office today for an evaluation.  Past Medical History  Diagnosis Date  . Hyperlipidemia   . Hypertension   . Aortic stenosis     s/p AVR by Dr Laneta Simmers  . Hyperthyroidism   . Hiatal hernia   . Right kidney stone     hx of  . Sick sinus syndrome (HCC)     s/p PPM (MDT) 2006  . Atrial fibrillation (HCC)     paroxysmal  . H/O: rheumatic fever   . CAD (coronary artery disease)     s/p CABG 2006  . Parkinson's disease   . Hearing deficit     Bilateral hearing aids  . Shortness of breath   . Dysrhythmia     ATRIAL FIBRILATION  . Pacemaker   . GERD (gastroesophageal reflux disease)   . Abnormality of gait 08/27/2014  . Memory difficulty 08/27/2014    Past Surgical History  Procedure Laterality Date  . Insert / replace / remove pacemaker  2006, 2012    MDT for sick sinus syndrome, gen change 8/12 by JA  . Coronary artery bypass graft  2006  . Aortic valve replacement  2006  . Kidney stone surgery    . Cholecystectomy N/A 07/21/2013    Procedure:  LAPAROSCOPIC CHOLECYSTECTOMY;  Surgeon: Mariella Saa, MD;  Location: [facility] OR;  Service: General;  Laterality: N/A;  . Cardioversion N/A 09/14/2013    Procedure: CARDIOVERSION;  Surgeon: Thurmon Fair, MD;  Location: [facility] ENDOSCOPY;  Service: Cardiovascular;  Laterality: N/A;  . Cardioversion  09-14-13  . Cholescystectomy  09-14-13  . Dental surgery      bridge    Family History  Problem Relation Age of Onset  . Coronary artery disease      family hx of  . Dementia Mother   . Parkinsonism Mother   . Heart disease Father   . Heart disease Sister   . Heart disease Brother   . Tremor Brother     Unilateral tremor    Social history:  reports that he has never smoked. He has never used smokeless tobacco. He reports that he does not drink alcohol or use illicit drugs.    Allergies  Allergen Reactions  . Sulfa Antibiotics     unknown    Medications:  Prior to Admission medications   Medication Sig Start Date End Date Taking? Authorizing Provider  aspirin 81 MG tablet Take 81 mg by mouth daily.     Yes Historical  Provider, MD  atorvastatin (LIPITOR) 20 MG tablet Take 1 tablet (20 mg total) by mouth daily. 05/29/15  Yes Hillis RangeJames Allred, MD  carbidopa-levodopa (SINEMET) 25-100 MG tablet Take 1 tablet by mouth 3 (three) times daily. 10/21/15  Yes York Spanielharles K Nasia Cannan, MD  esomeprazole (NEXIUM) 20 MG capsule Take 20 mg by mouth daily at 12 noon.   Yes Historical Provider, MD  levothyroxine (SYNTHROID, LEVOTHROID) 125 MCG tablet Take 125 mcg by mouth daily.     Yes Historical Provider, MD  metoprolol succinate (TOPROL-XL) 25 MG 24 hr tablet Take 1 tablet (25 mg total) by mouth daily. 10/16/15  Yes Hillis RangeJames Allred, MD  nitroGLYCERIN (NITROSTAT) 0.4 MG SL tablet Place 1 tablet (0.4 mg total) under the tongue every 5 (five) minutes x 3 doses as needed for chest pain. 05/29/15  Yes Hillis RangeJames Allred, MD  Triamcinolone Acetonide (NASACORT ALLERGY 24HR NA) Place 2 sprays into the nose daily.    Yes Historical  Provider, MD  trihexyphenidyl (ARTANE) 2 MG tablet Take 1 tablet (2 mg total) by mouth 2 (two) times daily with a meal. 09/03/15  Yes York Spanielharles K Tell Rozelle, MD  warfarin (COUMADIN) 5 MG tablet TAKE AS DIRECTED BY ANTICOAGULATION CLINIC 12/28/14  Yes Vesta MixerPhilip J Nahser, MD  rOPINIRole (REQUIP) 1 MG tablet 2 tablets three times a day for 6 weeks, then take 1 tablet three times a day 10/21/15   York Spanielharles K Keylani Perlstein, MD    ROS:  Out of a complete 14 system review of symptoms, the patient complains only of the following symptoms, and all other reviewed systems are negative.  Bruising easily Tremor  Blood pressure 132/70, pulse 60, height 5' 10.5" (1.791 m), weight 198 lb 8 oz (90.039 kg).  Physical Exam  General: The patient is alert and cooperative at the time of the examination.  Skin: No significant peripheral edema is noted.   Neurologic Exam  Mental status: The patient is alert and oriented x 3 at the time of the examination. The patient has apparent normal recent and remote memory, with an apparently normal attention span and concentration ability. Mini-Mental Status Examination reveals a score 29/30.   Cranial nerves: Facial symmetry is present. Speech is normal, no aphasia or dysarthria is noted. Occasional garbling of the speech is noted. Extraocular movements are full. Visual fields are full. Masking of the face is seen.  Motor: The patient has good strength in all 4 extremities.  Sensory examination: Soft touch sensation is symmetric on the face, arms, and legs.  Coordination: The patient has good finger-nose-finger and heel-to-shin bilaterally. A prominent resting tremors noted on the right upper extremity. The patient has significant apraxia with the use of the lower extremities.  Gait and station: The patient is able to arise from a seated position with arms crossed. Onset, he is able to ambulate without assistance, decreased arm swing is seen, prominent tremor is noted on the right upper  extremity with walking. Tandem gait is normal. Romberg is negative. No drift is seen.  Reflexes: Deep tendon reflexes are symmetric.   Assessment/Plan:  1. Parkinson's disease  2. Resting tremor, right upper extremity  The patient likely is developing a mild memory disorder. He has significant apraxia is on clinical examination. We will reduce the ropinirole taking 2 mg 3 times daily for 6 weeks, and then go to 1 mg 3 times daily. The Sinemet will be increased taking the 25/100 mg tablet 3 times daily. He will follow-up in 5 months, sooner if needed. Artane will  be kept at 2 mg twice daily.  Marlan Palau MD 10/21/2015 9:14 PM  [facility] [address] [place], [place] [address]  Phone [phone] Fax [phone]

## 2015-10-21 NOTE — Patient Instructions (Signed)
Parkinson Disease Parkinson disease is a disorder of the central nervous system, which includes the brain and spinal cord. A person with this disease slowly loses the ability to completely control body movements. Within the brain, there is a group of nerve cells (basal ganglia) that help control movement. The basal ganglia are damaged and do not work properly in a person with Parkinson disease. In addition, the basal ganglia produce and use a brain chemical called dopamine. The dopamine chemical sends messages to other parts of the body to control and coordinate body movements. Dopamine levels are low in a person with Parkinson disease. If the dopamine levels are low, then the body does not receive the correct messages it needs to move normally.  CAUSES  The exact reason why the basal ganglia get damaged is not known. Some medical researchers have thought that infection, genes, environment, and certain medicines may contribute to the cause.  SYMPTOMS   An early symptom of Parkinson disease is often an uncontrolled shaking (tremor) of the hands. The tremor will often disappear when the affected hand is consciously used.  As the disease progresses, walking, talking, getting out of a chair, and new movements become more difficult.  Muscles get stiff and movements become slower.  Balance and coordination become harder.  Depression, trouble swallowing, urinary problems, constipation, and sleep problems can occur.  Later in the disease, memory and thought processes may deteriorate. DIAGNOSIS  There are no specific tests to diagnose Parkinson disease. You may be referred to a neurologist for evaluation. Your caregiver will ask about your medical history, symptoms, and perform a physical exam. Blood tests and imaging tests of your brain may be performed to rule out other diseases. The imaging tests may include an MRI or a CT scan. TREATMENT  The goal of treatment is to relieve symptoms. Medicines may be  prescribed once the symptoms become troublesome. Medicine will not stop the progression of the disease, but medicine can make movement and balance better and help control tremors. Speech and occupational therapy may also be prescribed. Sometimes, surgical treatment of the brain can be done in young people. HOME CARE INSTRUCTIONS  Get regular exercise and rest periods during the day to help prevent exhaustion and depression.  If getting dressed becomes difficult, replace buttons and zippers with Velcro and elastic on your clothing.  Take all medicine as directed by your caregiver.  Install grab bars or railings in your home to prevent falls.  Go to speech or occupational therapy as directed.  Keep all follow-up visits as directed by your caregiver. SEEK MEDICAL CARE IF:  Your symptoms are not controlled with your medicine.  You fall.  You have trouble swallowing or choke on your food. MAKE SURE YOU:  Understand these instructions.  Will watch your condition.  Will get help right away if you are not doing well or get worse.   This information is not intended to replace advice given to you by your health care provider. Make sure you discuss any questions you have with your health care provider.   Document Released: 05/01/2000 Document Revised: 08/29/2012 Document Reviewed: 06/03/2011 Elsevier Interactive Patient Education 2016 [employer]  

## 2015-10-25 ENCOUNTER — Encounter: Payer: Self-pay | Admitting: Nurse Practitioner

## 2015-10-25 ENCOUNTER — Ambulatory Visit (INDEPENDENT_AMBULATORY_CARE_PROVIDER_SITE_OTHER): Payer: PPO | Admitting: Nurse Practitioner

## 2015-10-25 VITALS — BP 130/90 | HR 60 | Ht 71.0 in | Wt 199.4 lb

## 2015-10-25 DIAGNOSIS — Z950 Presence of cardiac pacemaker: Secondary | ICD-10-CM

## 2015-10-25 DIAGNOSIS — I495 Sick sinus syndrome: Secondary | ICD-10-CM

## 2015-10-25 DIAGNOSIS — I10 Essential (primary) hypertension: Secondary | ICD-10-CM

## 2015-10-25 DIAGNOSIS — I480 Paroxysmal atrial fibrillation: Secondary | ICD-10-CM | POA: Diagnosis not present

## 2015-10-25 MED ORDER — NITROGLYCERIN 0.4 MG SL SUBL: 0.4000 mg | SUBLINGUAL_TABLET | SUBLINGUAL | Status: DC | PRN

## 2015-10-25 NOTE — Patient Instructions (Addendum)
We will be checking the following labs today - NONE   Medication Instructions:    Continue with your current medicines.   I refilled your NTG today.     Testing/Procedures To Be Arranged:  N/A  Follow-Up:   See Dr. Johney FrameAllred in January    Other Special Instructions:   N/A    If you need a refill on your cardiac medications before your next appointment, please call your pharmacy.   Call the [facility] office at [phone] if you have any questions, problems or concerns.

## 2015-10-25 NOTE — Progress Notes (Signed)
CARDIOLOGY OFFICE NOTE  Date:  10/25/2015    Donald [name] Date of Birth: [birth year] Medical Record #[id number]  PCP:  Gaspar Garbe, MD  Cardiologist:  Allred  Chief Complaint  Patient presents with  . Hypertension  . Hyperlipidemia  . Cardiac Valve Problem  . Atrial Fibrillation    5 month check - seen for Dr. Johney Frame    History of Present Illness: Donald [name] is a 75 y.o. male who presents today for a follow up visit. He is seen for Dr. Johney Frame.   He has hypertension, hyperlipidemia, aortic stenosis with prior aortic valve replacement in 2006, hyperthyroidism, sick sinus syndrome with pacemaker implant in 2006, chronic atrial fibrillation, history of rheumatic fever, coronary disease with previous bypass surgery in 2006, Parkinson's (followed by Dr. Anne Hahn), and GERD.   Back in April of 2015 he was noted to have recurrent AF - was cardioverted but has had some recurrence. He remains on chronic coumadin therapy.   I saw him back in June of last year. Dr. Johney Frame saw him in January. He has done well - AF burden about 1.7%. Labs are checked by PCP.  He comes in today. He is here alone. He says he is doing well. Stays active "from sun up to sun down". No chest pain. Not short of breath. Coumadin going ok. Labs are checked by PCP. He has a one acre garden - he is pretty proud of this. BP looks good. He is happy with how he is doing. His Parkinson's medicines have recently been changed.   Past Medical History  Diagnosis Date  . Hyperlipidemia   . Hypertension   . Aortic stenosis     s/p AVR by Dr Laneta Simmers  . Hyperthyroidism   . Hiatal hernia   . Right kidney stone     hx of  . Sick sinus syndrome (HCC)     s/p PPM (MDT) 2006  . Atrial fibrillation (HCC)     paroxysmal  . H/O: rheumatic fever   . CAD (coronary artery disease)     s/p CABG 2006  . Parkinson's disease   . Hearing deficit     Bilateral hearing aids  . Shortness of breath   . Dysrhythmia    ATRIAL FIBRILATION  . Pacemaker   . GERD (gastroesophageal reflux disease)   . Abnormality of gait 08/27/2014  . Memory difficulty 08/27/2014    Past Surgical History  Procedure Laterality Date  . Insert / replace / remove pacemaker  2006, 2012    MDT for sick sinus syndrome, gen change 8/12 by JA  . Coronary artery bypass graft  2006  . Aortic valve replacement  2006  . Kidney stone surgery    . Cholecystectomy N/A 07/21/2013    Procedure: LAPAROSCOPIC CHOLECYSTECTOMY;  Surgeon: Mariella Saa, MD;  Location: [facility] OR;  Service: General;  Laterality: N/A;  . Cardioversion N/A 09/14/2013    Procedure: CARDIOVERSION;  Surgeon: Thurmon Fair, MD;  Location: [facility];  Service: Cardiovascular;  Laterality: N/A;  . Cardioversion  09-14-13  . Cholescystectomy  09-14-13  . Dental surgery      bridge     Medications: Current Outpatient Prescriptions  Medication Sig Dispense Refill  . aspirin 81 MG tablet Take 81 mg by mouth daily.      Marland Kitchen atorvastatin (LIPITOR) 20 MG tablet Take 1 tablet (20 mg total) by mouth daily. 90 tablet 3  . carbidopa-levodopa (SINEMET) 25-100 MG tablet Take 1 tablet  by mouth 3 (three) times daily. 270 tablet 3  . esomeprazole (NEXIUM) 20 MG capsule Take 20 mg by mouth daily at 12 noon.    Marland Kitchen levothyroxine (SYNTHROID, LEVOTHROID) 125 MCG tablet Take 125 mcg by mouth daily.      . metoprolol succinate (TOPROL-XL) 25 MG 24 hr tablet Take 1 tablet (25 mg total) by mouth daily. 90 tablet 1  . nitroGLYCERIN (NITROSTAT) 0.4 MG SL tablet Place 1 tablet (0.4 mg total) under the tongue every 5 (five) minutes x 3 doses as needed for chest pain. 25 tablet 6  . rOPINIRole (REQUIP) 1 MG tablet 2 tablets three times a day for 6 weeks, then take 1 tablet three times a day 180 tablet 2  . Triamcinolone Acetonide (NASACORT ALLERGY 24HR NA) Place 2 sprays into the nose daily.     . trihexyphenidyl (ARTANE) 2 MG tablet Take 1 tablet (2 mg total) by mouth 2 (two) times daily with a  meal. 180 tablet 3  . warfarin (COUMADIN) 5 MG tablet TAKE AS DIRECTED BY ANTICOAGULATION CLINIC 135 tablet 1   No current facility-administered medications for this visit.    Allergies: Allergies  Allergen Reactions  . Sulfa Antibiotics     unknown    Social History: The patient  reports that he has never smoked. He has never used smokeless tobacco. He reports that he does not drink alcohol or use illicit drugs.   Family History: The patient's family history includes Dementia in his mother; Heart disease in his brother, father, and sister; Parkinsonism in his mother; Tremor in his brother.   Review of Systems: Please see the history of present illness.   Otherwise, the review of systems is positive for none.   All other systems are reviewed and negative.   Physical Exam: VS:  BP 130/90 mmHg  Pulse 60  Ht  (1.803 m)  Wt 199 lb 6.4 oz (90.447 kg)  BMI 27.82 kg/m2 .  BMI Body mass index is 27.82 kg/(m^2).  Wt Readings from Last 3 Encounters:  10/25/15 199 lb 6.4 oz (90.447 kg)  10/21/15 198 lb 8 oz (90.039 kg)  06/28/15 195 lb (88.451 kg)    General: Pleasant. He is hard of hearing. He is alert and in no acute distress. Resting tremor of the right arm noted. He shuffles when he walks.  HEENT: Normal. Neck: Supple, no JVD, carotid bruits, or masses noted.  Cardiac: Regular rate and rhythm. Outflow murmur noted. No edema.  Respiratory:  Lungs are clear to auscultation bilaterally with normal work of breathing.  GI: Soft and nontender.  MS: No deformity or atrophy. Gait and ROM intact. Skin: Warm and dry. Color is normal.  Neuro:  Strength and sensation are intact and no gross focal deficits noted.  Psych: Alert, appropriate and with normal affect.   LABORATORY DATA:  EKG:  EKG is not ordered today.  Lab Results  Component Value Date   WBC 4.1* 09/08/2013   HGB 12.9* 09/08/2013   HCT 39.6 09/08/2013   PLT 181.0 09/08/2013   GLUCOSE 89 09/08/2013   ALT 25  07/22/2013   AST 29 07/22/2013   NA 140 09/08/2013   K 3.9 09/08/2013   CL 108 09/08/2013   CREATININE 0.7 09/08/2013   BUN 19 09/08/2013   CO2 26 09/08/2013   INR 3.1 10/11/2015    Lab Results  Component Value Date   INR 3.1 10/11/2015   INR 3.1 09/13/2015   INR 4.1 08/30/2015  BNP (last 3 results) No results for input(s): BNP in the last 8760 hours.  ProBNP (last 3 results) No results for input(s): PROBNP in the last 8760 hours.   Other Studies Reviewed Today:  ECHO Study Conclusions from 04/2013  - Left ventricle: The cavity size was normal. Wall thickness was increased in a pattern of mild LVH. Systolic function was normal. The estimated ejection fraction was in the range of 55% to 60%. Wall motion was normal; there were no regional wall motion abnormalities. Doppler parameters are consistent with abnormal left ventricular relaxation (grade 1 diastolic dysfunction). - Aortic valve: A mechanical prosthesis was present. - Mitral valve: Calcified annulus. Mildly thickened leaflets . - Left atrium: The atrium was mildly dilated. - Right ventricle: The cavity size was mildly dilated. - Right atrium: The atrium was mildly dilated. Impressions:  - Mechanical aortic valve with mildly elevated mean gradient of 25 mmHg; no old study for comparison.  Assessment/Plan: 1. Paroxysmal atrial fibrillation - managed with underlying PPM, rate control and remains on anticoagulation. He is in sinus by exam today. Labs are followed by his PCP. No problems with his coumadin noted.   2. Sick sinus syndrome - with underlying PPM in place - managed by Dr. Johney FrameAllred.   3. Aortic Stenosis - with prior AVR back in 2006 - doing well without issue.   4. HTN - BP ok on current regimen.  5. CAD with remote CABG - no symptoms noted.   6. Parkinson's  Current medicines are reviewed with the patient today.  The patient does not have concerns regarding medicines other  than what has been noted above.  The following changes have been made:  See above.  Labs/ tests ordered today include:   No orders of the defined types were placed in this encounter.     Disposition:   FU with Dr. Johney FrameAllred as planned in January.   Patient is agreeable to this plan and will call if any problems develop in the interim.   Signed: Rosalio MacadamiaLori C. Glyn Gerads, RN, ANP-C 10/25/2015 8:19 AM  [facility] [address] [place], [place]  [address] Phone: [phone] Fax: [phone]

## 2015-11-08 ENCOUNTER — Ambulatory Visit (INDEPENDENT_AMBULATORY_CARE_PROVIDER_SITE_OTHER): Payer: PPO | Admitting: *Deleted

## 2015-11-08 DIAGNOSIS — I350 Nonrheumatic aortic (valve) stenosis: Secondary | ICD-10-CM | POA: Diagnosis not present

## 2015-11-08 DIAGNOSIS — I359 Nonrheumatic aortic valve disorder, unspecified: Secondary | ICD-10-CM | POA: Diagnosis not present

## 2015-11-08 DIAGNOSIS — Z5181 Encounter for therapeutic drug level monitoring: Secondary | ICD-10-CM

## 2015-11-08 LAB — POCT INR: INR: 3.1

## 2015-11-15 ENCOUNTER — Telehealth: Payer: Self-pay | Admitting: Neurology

## 2015-11-15 MED ORDER — ROPINIROLE HCL 1 MG PO TABS: ORAL_TABLET | ORAL | Status: DC

## 2015-11-15 NOTE — Telephone Encounter (Signed)
Returned call to [facility]. Left mssg on secure voicemail verifying new MD instructions for ropinirole. Pt is to "reduce the ropinirole taking 2 mg 3 times daily for 6 weeks, and then go to 1 mg 3 times daily," please fill 1 mg rx. Qty changed to 90 day supply.

## 2015-11-15 NOTE — Telephone Encounter (Signed)
[facility] called in with a question aboutrOPINIRole (REQUIP) 1 MG tablet. They have another rx with dosage of 3mg  which one do they use. If pt is on requip 1 mg can the rx be adjusted to 90 day supply. Please call [phone]

## 2015-11-28 ENCOUNTER — Telehealth: Payer: Self-pay | Admitting: Cardiology

## 2015-11-28 ENCOUNTER — Ambulatory Visit (INDEPENDENT_AMBULATORY_CARE_PROVIDER_SITE_OTHER): Payer: PPO | Admitting: *Deleted

## 2015-11-28 DIAGNOSIS — I495 Sick sinus syndrome: Secondary | ICD-10-CM

## 2015-11-28 NOTE — Telephone Encounter (Signed)
Confirmed remote transmission w/ pt wife.   

## 2015-11-28 NOTE — Progress Notes (Signed)
Remote pacemaker transmission.   

## 2015-12-03 LAB — CUP PACEART REMOTE DEVICE CHECK
Battery Impedance: 258 Ohm
Brady Statistic AP VP Percent: 3 %
Brady Statistic AP VS Percent: 91 %
Brady Statistic AS VP Percent: 0 %
Brady Statistic AS VS Percent: 6 %
Implantable Lead Implant Date: 20060326
Implantable Lead Implant Date: 20060326
Implantable Lead Location: 753860
Implantable Lead Model: 5076
Implantable Lead Model: 5076
Lead Channel Impedance Value: 390 Ohm
Lead Channel Impedance Value: 480 Ohm
Lead Channel Pacing Threshold Amplitude: 0.5 V
Lead Channel Pacing Threshold Amplitude: 0.625 V
Lead Channel Pacing Threshold Pulse Width: 0.4 ms
Lead Channel Setting Pacing Amplitude: 2 V
Lead Channel Setting Pacing Amplitude: 2.5 V
MDC IDC LEAD LOCATION: 753859
MDC IDC MSMT BATTERY REMAINING LONGEVITY: 109 mo
MDC IDC MSMT BATTERY VOLTAGE: 2.79 V
MDC IDC MSMT LEADCHNL RV PACING THRESHOLD PULSEWIDTH: 0.4 ms
MDC IDC SESS DTM: 20170713152218
MDC IDC SET LEADCHNL RV PACING PULSEWIDTH: 0.4 ms
MDC IDC SET LEADCHNL RV SENSING SENSITIVITY: 4 mV

## 2015-12-04 ENCOUNTER — Encounter: Payer: Self-pay | Admitting: Cardiology

## 2015-12-06 ENCOUNTER — Ambulatory Visit (INDEPENDENT_AMBULATORY_CARE_PROVIDER_SITE_OTHER): Payer: PPO | Admitting: *Deleted

## 2015-12-06 DIAGNOSIS — Z5181 Encounter for therapeutic drug level monitoring: Secondary | ICD-10-CM | POA: Diagnosis not present

## 2015-12-06 DIAGNOSIS — I359 Nonrheumatic aortic valve disorder, unspecified: Secondary | ICD-10-CM

## 2015-12-06 DIAGNOSIS — I350 Nonrheumatic aortic (valve) stenosis: Secondary | ICD-10-CM

## 2015-12-06 LAB — POCT INR: INR: 3.5

## 2015-12-06 MED ORDER — WARFARIN SODIUM 5 MG PO TABS: ORAL_TABLET | ORAL | Status: DC

## 2015-12-11 ENCOUNTER — Telehealth: Payer: Self-pay | Admitting: Internal Medicine

## 2015-12-11 MED ORDER — WARFARIN SODIUM 5 MG PO TABS: ORAL_TABLET | ORAL | 1 refills | Status: DC

## 2015-12-11 NOTE — Telephone Encounter (Signed)
New message    Pharmacy calling to about the warfarin 5. Wants to know how many per day (90 days based on the directions). Insurance won't pay with the current way it is set-up. Please call.

## 2015-12-11 NOTE — Telephone Encounter (Signed)
New Rx sent to [facility] with more detailed instructions.

## 2016-01-01 DIAGNOSIS — H6123 Impacted cerumen, bilateral: Secondary | ICD-10-CM | POA: Diagnosis not present

## 2016-01-01 DIAGNOSIS — Z950 Presence of cardiac pacemaker: Secondary | ICD-10-CM | POA: Diagnosis not present

## 2016-01-01 DIAGNOSIS — E038 Other specified hypothyroidism: Secondary | ICD-10-CM | POA: Diagnosis not present

## 2016-01-01 DIAGNOSIS — Z952 Presence of prosthetic heart valve: Secondary | ICD-10-CM | POA: Diagnosis not present

## 2016-01-01 DIAGNOSIS — N401 Enlarged prostate with lower urinary tract symptoms: Secondary | ICD-10-CM | POA: Diagnosis not present

## 2016-01-01 DIAGNOSIS — D692 Other nonthrombocytopenic purpura: Secondary | ICD-10-CM | POA: Diagnosis not present

## 2016-01-01 DIAGNOSIS — G20 Parkinson's disease: Secondary | ICD-10-CM | POA: Diagnosis not present

## 2016-01-01 DIAGNOSIS — Z23 Encounter for immunization: Secondary | ICD-10-CM | POA: Diagnosis not present

## 2016-01-01 DIAGNOSIS — Z6828 Body mass index (BMI) 28.0-28.9, adult: Secondary | ICD-10-CM | POA: Diagnosis not present

## 2016-01-01 DIAGNOSIS — I482 Chronic atrial fibrillation: Secondary | ICD-10-CM | POA: Diagnosis not present

## 2016-01-01 DIAGNOSIS — I25810 Atherosclerosis of coronary artery bypass graft(s) without angina pectoris: Secondary | ICD-10-CM | POA: Diagnosis not present

## 2016-01-01 DIAGNOSIS — E7800 Pure hypercholesterolemia, unspecified: Secondary | ICD-10-CM | POA: Diagnosis not present

## 2016-01-10 ENCOUNTER — Ambulatory Visit (INDEPENDENT_AMBULATORY_CARE_PROVIDER_SITE_OTHER): Payer: PPO | Admitting: Pharmacist

## 2016-01-10 DIAGNOSIS — Z5181 Encounter for therapeutic drug level monitoring: Secondary | ICD-10-CM

## 2016-01-10 DIAGNOSIS — I350 Nonrheumatic aortic (valve) stenosis: Secondary | ICD-10-CM

## 2016-01-10 DIAGNOSIS — I359 Nonrheumatic aortic valve disorder, unspecified: Secondary | ICD-10-CM

## 2016-01-10 LAB — POCT INR: INR: 4.2

## 2016-01-21 ENCOUNTER — Ambulatory Visit (INDEPENDENT_AMBULATORY_CARE_PROVIDER_SITE_OTHER): Payer: PPO | Admitting: *Deleted

## 2016-01-21 DIAGNOSIS — I359 Nonrheumatic aortic valve disorder, unspecified: Secondary | ICD-10-CM

## 2016-01-21 DIAGNOSIS — I350 Nonrheumatic aortic (valve) stenosis: Secondary | ICD-10-CM

## 2016-01-21 DIAGNOSIS — Z5181 Encounter for therapeutic drug level monitoring: Secondary | ICD-10-CM | POA: Diagnosis not present

## 2016-01-21 LAB — POCT INR: INR: 3

## 2016-02-03 ENCOUNTER — Telehealth: Payer: Self-pay | Admitting: Neurology

## 2016-02-03 NOTE — Telephone Encounter (Signed)
Patient's wife is calling. She states the patient has memory problems, acting differently. He takes carbidopa-levodopa (SINEMET) 25-100 MG tablet and rOPINIRole (REQUIP) 1 MG tablet. Please call to discuss.

## 2016-02-03 NOTE — Telephone Encounter (Signed)
Returned call to pt's wife. She reports that pt seems worse since Sinemet was increased (25-100 TID) and Requip was decreased (1 mg TID). Has noticed a little more memory difficulties and a difference in pt's facial movements/expressions. Says that he seems to be holding his mouth open more but feels that his walking is about the same. Sooner appt scheduled for Thursday morning.

## 2016-02-03 NOTE — Telephone Encounter (Signed)
I called the patient. I talk with the wife. The patient has had some alteration in memory, he is now holding his mouth open a bit. I'm not clear that there has been a change in his mobility per se, the patient likely has underlying dementia, we will need to follow this over time. He had significant apraxia on examination in June. We will see him Thursday of this week.

## 2016-02-06 ENCOUNTER — Encounter: Payer: Self-pay | Admitting: Neurology

## 2016-02-06 ENCOUNTER — Ambulatory Visit (INDEPENDENT_AMBULATORY_CARE_PROVIDER_SITE_OTHER): Payer: PPO | Admitting: Neurology

## 2016-02-06 VITALS — BP 114/72 | HR 58 | Ht 71.0 in | Wt 192.5 lb

## 2016-02-06 DIAGNOSIS — G20 Parkinson's disease: Secondary | ICD-10-CM

## 2016-02-06 DIAGNOSIS — R269 Unspecified abnormalities of gait and mobility: Secondary | ICD-10-CM

## 2016-02-06 DIAGNOSIS — R413 Other amnesia: Secondary | ICD-10-CM

## 2016-02-06 MED ORDER — ROPINIROLE HCL 1 MG PO TABS: ORAL_TABLET | ORAL | Status: DC

## 2016-02-06 MED ORDER — CARBIDOPA-LEVODOPA 25-100 MG PO TABS: ORAL_TABLET | ORAL | 3 refills | Status: DC

## 2016-02-06 NOTE — Patient Instructions (Signed)
   We will go up on the Sinemet (carbidopa) 25/100 mg tablet to 1.5 in the morning and evening and one at midday.

## 2016-02-06 NOTE — Progress Notes (Signed)
Reason for visit: Parkinson's disease  Donald HornerFrank R [name] is an 75 y.o. male  History of present illness:  [name] is a 75 year old left-handed white male with a history of Parkinson's disease. The patient has prominent tremors on the right upper extremity, he is now developing some tremors as well the left side. Both legs are involved with tremors. The patient is on Sinemet, the patient may be developing a mild memory disorder. He has been reduced on the Requip going from 3 mg 3 times daily down to 1 mg 3 times daily. The patient is on Artane taking 2 mg twice daily for the tremor. Higher doses have resulted in increased confusion. The patient is now having some increased shuffling, he is holding his mouth open, he is drooling. The patient is having some mild progression of memory according to the wife. The patient is trying to remain active, he denies any falls, he does not use a cane for ambulation.  Past Medical History:  Diagnosis Date  . Abnormality of gait 08/27/2014  . Aortic stenosis    s/p AVR by Dr Laneta SimmersBartle  . Atrial fibrillation (HCC)    paroxysmal  . CAD (coronary artery disease)    s/p CABG 2006  . Dysrhythmia    ATRIAL FIBRILATION  . GERD (gastroesophageal reflux disease)   . H/O: rheumatic fever   . Hearing deficit    Bilateral hearing aids  . Hiatal hernia   . Hyperlipidemia   . Hypertension   . Hyperthyroidism   . Memory difficulty 08/27/2014  . Pacemaker   . Parkinson's disease   . Right kidney stone    hx of  . Shortness of breath   . Sick sinus syndrome (HCC)    s/p PPM (MDT) 2006    Past Surgical History:  Procedure Laterality Date  . AORTIC VALVE REPLACEMENT  2006  . CARDIOVERSION N/A 09/14/2013   Procedure: CARDIOVERSION;  Surgeon: Thurmon FairMihai Croitoru, MD;  Location: [facility];  Service: Cardiovascular;  Laterality: N/A;  . CARDIOVERSION  09-14-13  . CHOLECYSTECTOMY N/A 07/21/2013   Procedure: LAPAROSCOPIC CHOLECYSTECTOMY;  Surgeon: Mariella SaaBenjamin T Hoxworth,  MD;  Location: [facility];  Service: General;  Laterality: N/A;  . cholescystectomy  09-14-13  . CORONARY ARTERY BYPASS GRAFT  2006  . DENTAL SURGERY     [facility]  . INSERT / REPLACE / REMOVE PACEMAKER  2006, 2012   MDT for sick sinus syndrome, gen change 8/12 by JA  . KIDNEY STONE SURGERY      Family History  Problem Relation Age of Onset  . Dementia Mother   . Parkinsonism Mother   . Heart disease Father   . Heart disease Sister   . Heart disease Brother   . Tremor Brother     Unilateral tremor  . Coronary artery disease      family hx of    Social history:  reports that he has never smoked. He has never used smokeless tobacco. He reports that he does not drink alcohol or use drugs.    Allergies  Allergen Reactions  . Sulfa Antibiotics     unknown    Medications:  Prior to Admission medications   Medication Sig Start Date End Date Taking? Authorizing Provider  aspirin 81 MG tablet Take 81 mg by mouth daily.     Yes Historical Provider, MD  atorvastatin (LIPITOR) 20 MG tablet Take 1 tablet (20 mg total) by mouth daily. 05/29/15  Yes Hillis RangeJames Allred, MD  carbidopa-levodopa (SINEMET) 25-100  MG tablet Take 1 tablet by mouth 3 (three) times daily. 10/21/15  Yes York Spaniel, MD  esomeprazole (NEXIUM) 20 MG capsule Take 20 mg by mouth daily at 12 noon.   Yes Historical Provider, MD  levothyroxine (SYNTHROID, LEVOTHROID) 125 MCG tablet Take 125 mcg by mouth daily.     Yes Historical Provider, MD  metoprolol succinate (TOPROL-XL) 25 MG 24 hr tablet Take 1 tablet (25 mg total) by mouth daily. 10/16/15  Yes Hillis Range, MD  nitroGLYCERIN (NITROSTAT) 0.4 MG SL tablet Place 1 tablet (0.4 mg total) under the tongue every 5 (five) minutes x 3 doses as needed for chest pain. 10/25/15  Yes Rosalio Macadamia, NP  rOPINIRole (REQUIP) 1 MG tablet 2 tablets three times a day for 6 weeks, then take 1 tablet three times a day Patient taking differently: Take 1 mg by mouth 3 (three) times daily. 2 tablets  three times a day for 6 weeks, then take 1 tablet three times a day 11/15/15  Yes York Spaniel, MD  Triamcinolone Acetonide (NASACORT ALLERGY 24HR NA) Place 2 sprays into the nose daily.    Yes Historical Provider, MD  trihexyphenidyl (ARTANE) 2 MG tablet Take 1 tablet (2 mg total) by mouth 2 (two) times daily with a meal. 09/03/15  Yes York Spaniel, MD  warfarin (COUMADIN) 5 MG tablet TAKE 1- 1 1/2 TABLETS BY MOUTH DAILY AS DIRECTED BY ANTICOAGULATION CLINIC 12/11/15  Yes Vesta Mixer, MD    ROS:  Out of a complete 14 system review of symptoms, the patient complains only of the following symptoms, and all other reviewed systems are negative.  Drooling Memory loss Tremors  Blood pressure 114/72, pulse (!) 58, height 5\' 11"  (1.803 m), weight 192 lb 8 oz (87.3 kg).  Physical Exam  General: The patient is alert and cooperative at the time of the examination.  Skin: No significant peripheral edema is noted.   Neurologic Exam  Mental status: The patient is alert and oriented x 3 at the time of the examination. The patient has apparent normal recent and remote memory, with an apparently normal attention span and concentration ability. Mini-Mental Status Examination done today shows a total score 27/29. The patient is hard of hearing.   Cranial nerves: Facial symmetry is present. Speech is normal, no aphasia or dysarthria is noted. Extraocular movements are full, with exception of some restriction of superior gaze. Visual fields are full. Masking of the face is seen.  Motor: The patient has good strength in all 4 extremities.  Sensory examination: Soft touch sensation is symmetric on the face, arms, and legs.  Coordination: The patient has good finger-nose-finger and heel-to-shin bilaterally. There appears to be some apraxia with the use of the lower extremities. Tremors are seen on both legs, right arm.  Gait and station: The patient has some difficulty arising from a seated  position to a standing position. The patient is able to and leg independently, prominent tremors in the right arm are noted with walking. Romberg is negative. Tandem gait is slightly unsteady.  Reflexes: Deep tendon reflexes are symmetric.   Assessment/Plan:  1. Parkinson's disease  2. Mild memory disorder  3. Mild gait disorder  The patient will be increased on Sinemet taking 1.5 tablets in the morning and evening and 1 tablet at midday. The Requip will remain at 1 mg 3 times daily. At some point in the future we may need to reduce the Artane given the memory issues. He  will follow-up in 4 months.  Marlan Palau MD 02/06/2016 9:53 AM  [facility] [address] [place], [place] [address]  Phone [phone] Fax [phone]

## 2016-02-10 ENCOUNTER — Telehealth: Payer: Self-pay | Admitting: Neurology

## 2016-02-10 NOTE — Telephone Encounter (Signed)
Returned call and left rx clarification on secured VM for [facility]. May call back w/ additional questions.

## 2016-02-10 NOTE — Telephone Encounter (Signed)
[facility] needs clarification on medication carbidopa-levodopa (SINEMET) 25-100 MG tablet. Please call [phone]

## 2016-02-11 ENCOUNTER — Ambulatory Visit (INDEPENDENT_AMBULATORY_CARE_PROVIDER_SITE_OTHER): Payer: PPO | Admitting: *Deleted

## 2016-02-11 DIAGNOSIS — I350 Nonrheumatic aortic (valve) stenosis: Secondary | ICD-10-CM

## 2016-02-11 DIAGNOSIS — I359 Nonrheumatic aortic valve disorder, unspecified: Secondary | ICD-10-CM

## 2016-02-11 DIAGNOSIS — Z5181 Encounter for therapeutic drug level monitoring: Secondary | ICD-10-CM | POA: Diagnosis not present

## 2016-02-11 LAB — POCT INR: INR: 2.8

## 2016-02-27 ENCOUNTER — Encounter: Payer: PPO | Admitting: *Deleted

## 2016-02-28 ENCOUNTER — Encounter: Payer: Self-pay | Admitting: Cardiology

## 2016-03-03 ENCOUNTER — Ambulatory Visit (INDEPENDENT_AMBULATORY_CARE_PROVIDER_SITE_OTHER): Payer: PPO | Admitting: *Deleted

## 2016-03-03 DIAGNOSIS — I350 Nonrheumatic aortic (valve) stenosis: Secondary | ICD-10-CM | POA: Diagnosis not present

## 2016-03-03 DIAGNOSIS — Z5181 Encounter for therapeutic drug level monitoring: Secondary | ICD-10-CM | POA: Diagnosis not present

## 2016-03-03 DIAGNOSIS — I359 Nonrheumatic aortic valve disorder, unspecified: Secondary | ICD-10-CM | POA: Diagnosis not present

## 2016-03-03 LAB — POCT INR: INR: 3.4

## 2016-03-12 ENCOUNTER — Other Ambulatory Visit: Payer: Self-pay | Admitting: Internal Medicine

## 2016-03-16 ENCOUNTER — Other Ambulatory Visit: Payer: Self-pay | Admitting: Cardiology

## 2016-03-16 NOTE — Progress Notes (Signed)
Opened in error

## 2016-03-24 ENCOUNTER — Encounter: Payer: Self-pay | Admitting: Neurology

## 2016-03-24 ENCOUNTER — Ambulatory Visit (INDEPENDENT_AMBULATORY_CARE_PROVIDER_SITE_OTHER): Payer: PPO | Admitting: Neurology

## 2016-03-24 VITALS — BP 127/75 | HR 65 | Ht 71.0 in | Wt 193.0 lb

## 2016-03-24 DIAGNOSIS — G20 Parkinson's disease: Secondary | ICD-10-CM

## 2016-03-24 DIAGNOSIS — R269 Unspecified abnormalities of gait and mobility: Secondary | ICD-10-CM | POA: Diagnosis not present

## 2016-03-24 DIAGNOSIS — R413 Other amnesia: Secondary | ICD-10-CM | POA: Diagnosis not present

## 2016-03-24 MED ORDER — TRIHEXYPHENIDYL HCL 2 MG PO TABS: 1.0000 mg | ORAL_TABLET | Freq: Two times a day (BID) | ORAL | Status: DC

## 2016-03-24 MED ORDER — CARBIDOPA-LEVODOPA 25-100 MG PO TABS: ORAL_TABLET | ORAL | Status: DC

## 2016-03-24 NOTE — Patient Instructions (Signed)
   We will reduce the Artane 2 mg tablet to 1/2 tablet twice a day and increase the Sinemet (carbidopa) to 1.5 tablets three times a day.

## 2016-03-24 NOTE — Progress Notes (Signed)
Reason for visit: Parkinson's disease  Donald [name] is an [age] y.o. male  History of present illness:  Donald [name] is a 75 year old left-handed white male with a history of Parkinson's disease and a mild memory disturbance. The patient has had no falls since last seen. He has had some increase in the Sinemet which seemed to help transiently for several weeks, but the patient has not gotten any long-lasting benefit from the medication. The patient continues to have significant tremors involving the right upper extremity. He is on Artane taking 2 mg twice daily. The patient reports no significant change in memory since last seen. 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 He indicates that he is sleeping well at night. He returns for an evaluation.  Past Medical History:  Diagnosis Date  . Abnormality of gait 08/27/2014  . Aortic stenosis    s/p AVR by Dr Laneta SimmersBartle  . Atrial fibrillation (HCC)    paroxysmal  . CAD (coronary artery disease)    s/p CABG 2006  . Dysrhythmia    ATRIAL FIBRILATION  . GERD (gastroesophageal reflux disease)   . H/O: rheumatic fever   . Hearing deficit    Bilateral hearing aids  . Hiatal hernia   . Hyperlipidemia   . Hypertension   . Hyperthyroidism   . Memory difficulty 08/27/2014  . Pacemaker   . Parkinson's disease   . Right kidney stone    hx of  . Shortness of breath   . Sick sinus syndrome (HCC)    s/p PPM (MDT) 2006    Past Surgical History:  Procedure Laterality Date  . AORTIC VALVE REPLACEMENT  2006  . CARDIOVERSION N/A 09/14/2013   Procedure: CARDIOVERSION;  Surgeon: Thurmon FairMihai Croitoru, MD;  Location: [facility];  Service: Cardiovascular;  Laterality: N/A;  . CARDIOVERSION  09-14-13  . CHOLECYSTECTOMY N/A 07/21/2013   Procedure: LAPAROSCOPIC CHOLECYSTECTOMY;  Surgeon: Mariella SaaBenjamin T Hoxworth, MD;  Location: [facility];  Service: General;  Laterality: N/A;  . cholescystectomy  09-14-13  . CORONARY ARTERY BYPASS GRAFT  2006    . DENTAL SURGERY     [facility]  . INSERT / REPLACE / REMOVE PACEMAKER  2006, 2012   MDT for sick sinus syndrome, gen change 8/12 by JA  . KIDNEY STONE SURGERY      Family History  Problem Relation Age of Onset  . Dementia Mother   . Parkinsonism Mother   . Heart disease Father   . Heart disease Sister   . Heart disease Brother   . Tremor Brother     Unilateral tremor  . Coronary artery disease      family hx of    Social history:  reports that he has never smoked. He has never used smokeless tobacco. He reports that he does not drink alcohol or use drugs.    Allergies  Allergen Reactions  . Sulfa Antibiotics     unknown    Medications:  Prior to Admission medications   Medication Sig Start Date End Date Taking? Authorizing Provider  aspirin 81 MG tablet Take 81 mg by mouth daily.     Yes Historical Provider, MD  atorvastatin (LIPITOR) 20 MG tablet Take 1 tablet (20 mg total) by mouth daily. 05/29/15  Yes Hillis RangeJames Allred, MD  carbidopa-levodopa (SINEMET) 25-100 MG tablet 1.5 tablets in the morning, midday, and evening 03/24/16  Yes York Spanielharles K Ilyse Tremain, MD  esomeprazole (NEXIUM) 20 MG capsule Take 20 mg by mouth daily  at 12 noon.   Yes Historical Provider, MD  levothyroxine (SYNTHROID, LEVOTHROID) 125 MCG tablet Take 125 mcg by mouth daily.     Yes Historical Provider, MD  metoprolol succinate (TOPROL-XL) 25 MG 24 hr tablet Take 1 tablet by mouth daily 03/12/16  Yes Hillis RangeJames Allred, MD  nitroGLYCERIN (NITROSTAT) 0.4 MG SL tablet Place 1 tablet (0.4 mg total) under the tongue every 5 (five) minutes x 3 doses as needed for chest pain. 10/25/15  Yes Rosalio MacadamiaLori C Gerhardt, NP  rOPINIRole (REQUIP) 1 MG tablet take 1 tablet three times a day 02/06/16  Yes York Spanielharles K Diella Gillingham, MD  Triamcinolone Acetonide (NASACORT ALLERGY 24HR NA) Place 2 sprays into the nose daily.    Yes Historical Provider, MD  warfarin (COUMADIN) 5 MG tablet TAKE 1- 1 1/2 TABLETS BY MOUTH DAILY AS DIRECTED BY ANTICOAGULATION CLINIC  12/11/15  Yes Vesta MixerPhilip J Nahser, MD  trihexyphenidyl (ARTANE) 2 MG tablet Take 0.5 tablets (1 mg total) by mouth 2 (two) times daily with a meal. 03/24/16   York Spanielharles K Mclane Arora, MD    ROS:  Out of a complete 14 system review of symptoms, the patient complains only of the following symptoms, and all other reviewed systems are negative.  Tremor  Blood pressure 127/75, pulse 65, height 5\' 11"  (1.803 m), weight 193 lb (87.5 kg).  Physical Exam  General: The patient is alert and cooperative at the time of the examination. The patient is hard of hearing.  Skin: No significant peripheral edema is noted.   Neurologic Exam  Mental status: The patient is alert and oriented x 3 at the time of the examination. The Mini-Mental Status Examination done today shows a total score of 25/29.   Cranial nerves: Facial symmetry is present. Speech is normal, no aphasia or dysarthria is noted. Extraocular movements are full. Visual fields are full. Masking of the face is seen.  Motor: The patient has good strength in all 4 extremities.  Sensory examination: Soft touch sensation is symmetric on the face, arms, and legs.  Coordination: The patient has good finger-nose-finger and heel-to-shin bilaterally. The patient has a prominent resting tremor of the right upper extremity.  Gait and station: The patient has the ability to stand from a seated position with arms crossed. Once up, the patient ambulates relatively well with good stride, good turns. The patient has a prominent tremor in the right arm with walking. Tandem gait was not attempted. Romberg is negative. No drift is seen.  Reflexes: Deep tendon reflexes are symmetric.   Assessment/Plan:  1. Parkinson's disease  2. Right upper extremity tremor  3. Memory disturbance  The patient has had some decline on the Mini-Mental Status Examination. He is on Artane, we will need to reduce the dose to help preserve cognitive functioning. The patient will go  to 1 mg of Artane twice daily, we will eventually discontinue this medication. The Sinemet will be increased taking 1.5 tablets of the 25/100 mg tablet 3 times daily. He will follow-up for his next scheduled revisit in January.  Marlan Palau. Keith Makaia Rappa MD 03/24/2016 8:39 AM  [facility] [address] [place], [place] [id number]  Phone [phone] Fax [phone]

## 2016-03-31 ENCOUNTER — Ambulatory Visit (INDEPENDENT_AMBULATORY_CARE_PROVIDER_SITE_OTHER): Payer: PPO | Admitting: *Deleted

## 2016-03-31 DIAGNOSIS — I359 Nonrheumatic aortic valve disorder, unspecified: Secondary | ICD-10-CM | POA: Diagnosis not present

## 2016-03-31 DIAGNOSIS — I350 Nonrheumatic aortic (valve) stenosis: Secondary | ICD-10-CM

## 2016-03-31 DIAGNOSIS — Z5181 Encounter for therapeutic drug level monitoring: Secondary | ICD-10-CM

## 2016-03-31 LAB — POCT INR: INR: 4.5

## 2016-04-14 ENCOUNTER — Ambulatory Visit (INDEPENDENT_AMBULATORY_CARE_PROVIDER_SITE_OTHER): Payer: PPO

## 2016-04-14 DIAGNOSIS — I350 Nonrheumatic aortic (valve) stenosis: Secondary | ICD-10-CM

## 2016-04-14 DIAGNOSIS — I359 Nonrheumatic aortic valve disorder, unspecified: Secondary | ICD-10-CM | POA: Diagnosis not present

## 2016-04-14 DIAGNOSIS — Z5181 Encounter for therapeutic drug level monitoring: Secondary | ICD-10-CM

## 2016-04-14 LAB — POCT INR: INR: 3.5

## 2016-04-16 ENCOUNTER — Other Ambulatory Visit: Payer: Self-pay | Admitting: Neurology

## 2016-05-05 ENCOUNTER — Ambulatory Visit (INDEPENDENT_AMBULATORY_CARE_PROVIDER_SITE_OTHER): Payer: PPO | Admitting: *Deleted

## 2016-05-05 DIAGNOSIS — I350 Nonrheumatic aortic (valve) stenosis: Secondary | ICD-10-CM

## 2016-05-05 DIAGNOSIS — I359 Nonrheumatic aortic valve disorder, unspecified: Secondary | ICD-10-CM

## 2016-05-05 DIAGNOSIS — Z5181 Encounter for therapeutic drug level monitoring: Secondary | ICD-10-CM

## 2016-05-05 LAB — POCT INR: INR: 3.6

## 2016-05-08 ENCOUNTER — Encounter: Payer: Self-pay | Admitting: Internal Medicine

## 2016-05-29 ENCOUNTER — Encounter: Payer: PPO | Admitting: Internal Medicine

## 2016-05-29 ENCOUNTER — Ambulatory Visit (INDEPENDENT_AMBULATORY_CARE_PROVIDER_SITE_OTHER): Payer: PPO

## 2016-05-29 DIAGNOSIS — I350 Nonrheumatic aortic (valve) stenosis: Secondary | ICD-10-CM

## 2016-05-29 DIAGNOSIS — Z5181 Encounter for therapeutic drug level monitoring: Secondary | ICD-10-CM

## 2016-05-29 DIAGNOSIS — I359 Nonrheumatic aortic valve disorder, unspecified: Secondary | ICD-10-CM

## 2016-05-29 LAB — POCT INR: INR: 4.3

## 2016-06-01 ENCOUNTER — Encounter: Payer: Self-pay | Admitting: *Deleted

## 2016-06-08 ENCOUNTER — Encounter: Payer: Self-pay | Admitting: Neurology

## 2016-06-08 ENCOUNTER — Ambulatory Visit (INDEPENDENT_AMBULATORY_CARE_PROVIDER_SITE_OTHER): Payer: PPO | Admitting: Neurology

## 2016-06-08 VITALS — BP 126/80 | HR 61 | Ht 71.0 in | Wt 192.0 lb

## 2016-06-08 DIAGNOSIS — R269 Unspecified abnormalities of gait and mobility: Secondary | ICD-10-CM | POA: Diagnosis not present

## 2016-06-08 DIAGNOSIS — G20 Parkinson's disease: Secondary | ICD-10-CM

## 2016-06-08 DIAGNOSIS — R413 Other amnesia: Secondary | ICD-10-CM | POA: Diagnosis not present

## 2016-06-08 DIAGNOSIS — G20A1 Parkinson's disease without dyskinesia, without mention of fluctuations: Secondary | ICD-10-CM

## 2016-06-08 NOTE — Progress Notes (Signed)
Reason for visit: Parkinson's disease  Donald HornerFrank R [name] is an 76 y.o. male  History of present illness:  Donald [name] is a 76 year old left-handed white male with a history of Parkinson's disease primarily with right-sided features. The patient has prominent tremors of the right arm, but the tremors are now affecting the left arm as well. The patient has tremors as well on the right leg and with the jaw. The patient has a mild memory disturbance, he is on Artane for the tremor, but the patient does not believe that the memory has changed much since last seen. He is drooling some more, he is more active in his sleep. The patient denies any falls, he remains active in the day. If he is inactive such as watching TV, he will fall sleep. The patient denies any choking with swallowing. He returns for an evaluation.  Past Medical History:  Diagnosis Date  . Abnormality of gait 08/27/2014  . Aortic stenosis    s/p AVR by Dr Laneta SimmersBartle  . Atrial fibrillation (HCC)    paroxysmal  . CAD (coronary artery disease)    s/p CABG 2006  . Dysrhythmia    ATRIAL FIBRILATION  . GERD (gastroesophageal reflux disease)   . H/O: rheumatic fever   . Hearing deficit    Bilateral hearing aids  . Hiatal hernia   . Hyperlipidemia   . Hypertension   . Hyperthyroidism   . Memory difficulty 08/27/2014  . Pacemaker   . Parkinson's disease   . Right kidney stone    hx of  . Shortness of breath   . Sick sinus syndrome (HCC)    s/p PPM (MDT) 2006    Past Surgical History:  Procedure Laterality Date  . AORTIC VALVE REPLACEMENT  2006  . CARDIOVERSION N/A 09/14/2013   Procedure: CARDIOVERSION;  Surgeon: Thurmon FairMihai Croitoru, MD;  Location: [facility];  Service: Cardiovascular;  Laterality: N/A;  . CARDIOVERSION  09-14-13  . CHOLECYSTECTOMY N/A 07/21/2013   Procedure: LAPAROSCOPIC CHOLECYSTECTOMY;  Surgeon: Mariella SaaBenjamin T Hoxworth, MD;  Location: [facility];  Service: General;  Laterality: N/A;  . cholescystectomy  09-14-13  .  CORONARY ARTERY BYPASS GRAFT  2006  . DENTAL SURGERY     [facility]  . INSERT / REPLACE / REMOVE PACEMAKER  2006, 2012   MDT for sick sinus syndrome, gen change 8/12 by JA  . KIDNEY STONE SURGERY      Family History  Problem Relation Age of Onset  . Dementia Mother   . Parkinsonism Mother   . Heart disease Father   . Heart disease Sister   . Heart disease Brother   . Tremor Brother     Unilateral tremor  . Coronary artery disease      family hx of    Social history:  reports that he has never smoked. He has never used smokeless tobacco. He reports that he does not drink alcohol or use drugs.    Allergies  Allergen Reactions  . Sulfa Antibiotics     unknown    Medications:  Prior to Admission medications   Medication Sig Start Date End Date Taking? Authorizing Provider  aspirin 81 MG tablet Take 81 mg by mouth daily.     Yes Historical Provider, MD  atorvastatin (LIPITOR) 20 MG tablet Take 1 tablet (20 mg total) by mouth daily. 05/29/15  Yes Hillis RangeJames Allred, MD  carbidopa-levodopa (SINEMET) 25-100 MG tablet 1.5 tablets in the morning, midday, and evening 03/24/16  Yes York Spanielharles K Willis, MD  esomeprazole (NEXIUM) 20 MG capsule Take 20 mg by mouth daily at 12 noon.   Yes Historical Provider, MD  levothyroxine (SYNTHROID, LEVOTHROID) 125 MCG tablet Take 125 mcg by mouth daily.     Yes Historical Provider, MD  metoprolol succinate (TOPROL-XL) 25 MG 24 hr tablet Take 1 tablet by mouth daily 03/12/16  Yes Hillis Range, MD  nitroGLYCERIN (NITROSTAT) 0.4 MG SL tablet Place 1 tablet (0.4 mg total) under the tongue every 5 (five) minutes x 3 doses as needed for chest pain. 10/25/15  Yes Rosalio Macadamia, NP  rOPINIRole (REQUIP) 1 MG tablet Take 1 tablet by mouth 3 times a day 04/16/16  Yes York Spaniel, MD  Triamcinolone Acetonide (NASACORT ALLERGY 24HR NA) Place 2 sprays into the nose daily.    Yes Historical Provider, MD  trihexyphenidyl (ARTANE) 2 MG tablet Take 0.5 tablets (1 mg total) by  mouth 2 (two) times daily with a meal. 03/24/16  Yes York Spaniel, MD  warfarin (COUMADIN) 5 MG tablet TAKE 1- 1 1/2 TABLETS BY MOUTH DAILY AS DIRECTED BY ANTICOAGULATION CLINIC 12/11/15  Yes Vesta Mixer, MD    ROS:  Out of a complete 14 system review of symptoms, the patient complains only of the following symptoms, and all other reviewed systems are negative.  Drooling Snoring, sleep talking Passing out  Blood pressure 126/80, pulse 61, height 5\' 11"  (1.803 m), weight 192 lb (87.1 kg).  Physical Exam  General: The patient is alert and cooperative at the time of the examination.  Skin: No significant peripheral edema is noted.   Neurologic Exam  Mental status: The patient is alert and oriented x 3 at the time of the examination. The patient has apparent normal recent and remote memory, with an apparently normal attention span and concentration ability. Mini-Mental Status Examination done today shows a total score 28/30.   Cranial nerves: Facial symmetry is present. Speech is normal, no aphasia or dysarthria is noted. Extraocular movements are full. Visual fields are full. The patient is hard of hearing. Masking of the face is seen. A jaw tremor is noted.  Motor: The patient has good strength in all 4 extremities.  Sensory examination: Soft touch sensation is symmetric on the face, arms, and legs.  Coordination: The patient has good finger-nose-finger and heel-to-shin bilaterally. Prominent resting tremors are noted with the right arm, slightly with the left arm, and with the right leg.  Gait and station: The patient has a normal gait. The patient is able to arise from a seated position with arms crossed. The patient takes good stride, has good turns, with walking there is a prominent tremor on the right arm, the right arm has decreased arm swing, slightly in flexion. Tandem gait is normal. Romberg is negative. No drift is seen.  Reflexes: Deep tendon reflexes are  symmetric.   Assessment/Plan:  1. Parkinson's disease  2. Mild memory disturbance  The patient is doing well with his mobility, the patient seems to exercise on a regular basis by going to the [facility]. The patient is on Artane for the tremor, this will need to be reduced and discontinued if the memory worsens in the future. This will need to be followed closely. No change in Sinemet dosing or Requip today. He will follow-up in 4 or 5 months.  Marlan Palau MD 06/08/2016 10:49 AM  [facility] [address] [place], [place] [address]  Phone [phone] Fax [phone]

## 2016-06-12 ENCOUNTER — Encounter: Payer: PPO | Admitting: Internal Medicine

## 2016-06-15 ENCOUNTER — Ambulatory Visit (INDEPENDENT_AMBULATORY_CARE_PROVIDER_SITE_OTHER): Payer: PPO | Admitting: Internal Medicine

## 2016-06-15 ENCOUNTER — Encounter: Payer: Self-pay | Admitting: Internal Medicine

## 2016-06-15 ENCOUNTER — Ambulatory Visit (INDEPENDENT_AMBULATORY_CARE_PROVIDER_SITE_OTHER): Payer: PPO

## 2016-06-15 VITALS — BP 148/82 | HR 76 | Ht 71.0 in | Wt 197.6 lb

## 2016-06-15 DIAGNOSIS — Z950 Presence of cardiac pacemaker: Secondary | ICD-10-CM

## 2016-06-15 DIAGNOSIS — I481 Persistent atrial fibrillation: Secondary | ICD-10-CM | POA: Diagnosis not present

## 2016-06-15 DIAGNOSIS — I495 Sick sinus syndrome: Secondary | ICD-10-CM | POA: Diagnosis not present

## 2016-06-15 DIAGNOSIS — Z5181 Encounter for therapeutic drug level monitoring: Secondary | ICD-10-CM

## 2016-06-15 DIAGNOSIS — I359 Nonrheumatic aortic valve disorder, unspecified: Secondary | ICD-10-CM | POA: Diagnosis not present

## 2016-06-15 DIAGNOSIS — I350 Nonrheumatic aortic (valve) stenosis: Secondary | ICD-10-CM | POA: Diagnosis not present

## 2016-06-15 DIAGNOSIS — I4819 Other persistent atrial fibrillation: Secondary | ICD-10-CM

## 2016-06-15 LAB — POCT INR: INR: 3.5

## 2016-06-15 NOTE — Progress Notes (Signed)
PCP: Gaspar Garbeichard W Tisovec, MD Neurologist: Jenetta DownerWillis  Donald [name] is a 76 y.o. male who presents today for electrophysiology followup.  He remains active.  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  His parkinson's has advanced some.  He did not qualify for neuro stimulator.  He has been in afib since mid September but is unaware.  Today, he denies symptoms of palpitations, chest pain, shortness of breath,  dizziness, presyncope, or syncope.  + chronic mild R leg swelling.  The patient is otherwise without complaint today.   Past Medical History:  Diagnosis Date  . Abnormality of gait 08/27/2014  . Aortic stenosis    s/p AVR by Dr Laneta SimmersBartle  . Atrial fibrillation (HCC)    paroxysmal  . CAD (coronary artery disease)    s/p CABG 2006  . Dysrhythmia    ATRIAL FIBRILATION  . GERD (gastroesophageal reflux disease)   . H/O: rheumatic fever   . Hearing deficit    Bilateral hearing aids  . Hiatal hernia   . Hyperlipidemia   . Hypertension   . Hyperthyroidism   . Memory difficulty 08/27/2014  . Pacemaker   . Parkinson's disease   . Right kidney stone    hx of  . Shortness of breath   . Sick sinus syndrome (HCC)    s/p PPM (MDT) 2006   Past Surgical History:  Procedure Laterality Date  . AORTIC VALVE REPLACEMENT  2006  . CARDIOVERSION N/A 09/14/2013   Procedure: CARDIOVERSION;  Surgeon: Thurmon FairMihai Croitoru, MD;  Location: [facility];  Service: Cardiovascular;  Laterality: N/A;  . CARDIOVERSION  09-14-13  . CHOLECYSTECTOMY N/A 07/21/2013   Procedure: LAPAROSCOPIC CHOLECYSTECTOMY;  Surgeon: Mariella SaaBenjamin T Hoxworth, MD;  Location: [facility];  Service: General;  Laterality: N/A;  . cholescystectomy  09-14-13  . CORONARY ARTERY BYPASS GRAFT  2006  . DENTAL SURGERY     bridge  . INSERT / REPLACE / REMOVE PACEMAKER  2006, 2012   MDT for sick sinus syndrome, gen change 8/12 by JA  . KIDNEY STONE SURGERY      Current Outpatient Prescriptions  Medication Sig Dispense Refill  .  aspirin 81 MG tablet Take 81 mg by mouth daily.      Marland Kitchen. atorvastatin (LIPITOR) 20 MG tablet Take 1 tablet (20 mg total) by mouth daily. 90 tablet 3  . carbidopa-levodopa (SINEMET) 25-100 MG tablet 1.5 tablets in the morning, midday, and evening    . esomeprazole (NEXIUM) 20 MG capsule Take 20 mg by mouth daily at 12 noon.    Marland Kitchen. levothyroxine (SYNTHROID, LEVOTHROID) 125 MCG tablet Take 125 mcg by mouth daily.      . metoprolol succinate (TOPROL-XL) 25 MG 24 hr tablet Take 1 tablet by mouth daily 90 tablet 1  . nitroGLYCERIN (NITROSTAT) 0.4 MG SL tablet Place 1 tablet (0.4 mg total) under the tongue every 5 (five) minutes x 3 doses as needed for chest pain. 25 tablet 6  . rOPINIRole (REQUIP) 1 MG tablet Take 1 tablet by mouth 3 times a day 270 tablet 3  . Triamcinolone Acetonide (NASACORT ALLERGY 24HR NA) Place 2 sprays into the nose daily.     . trihexyphenidyl (ARTANE) 2 MG tablet Take 0.5 tablets (1 mg total) by mouth 2 (two) times daily with a meal.    . warfarin (COUMADIN) 5 MG tablet TAKE 1- 1 1/2 TABLETS BY MOUTH DAILY AS DIRECTED BY ANTICOAGULATION CLINIC 135 tablet 1   No current facility-administered medications for  this visit.    ROS- all systems are reviewed and negative except as per HPI above  Physical Exam: Vitals:   06/15/16 1449  BP: (!) 148/82  Pulse: 76  Weight: 197 lb 9.6 oz (89.6 kg)  Height: 5\' 11"  (1.803 m)    GEN- The patient is well appearing, alert and oriented x 3 today.   Head- normocephalic, atraumatic Eyes-  Sclera clear, conjunctiva pink Ears- hearing intact Oropharynx- clear Lungs- Clear to ausculation bilaterally, normal work of breathing Chest- pacemaker pocket is well healed Heart- regular rate and rhythm, mechanical S2 GI- soft, NT, ND, + BS Extremities- no clubbing, cyanosis, 1+ R leg edema Tremor is stable  Pacemaker interrogation- reviewed in detail today,  See PACEART report  Assessment and Plan:  1.  Persistent atrial  fibrillation Chronically anticoagulated with coumadin (mechanical valve) Previously well controlled afi 1.7% but now has been in persistent afib since September. He is unaware of any symptoms.  Not currently interested in cardioversion Continue coumadin  2. Sick sinus syndrome Normal pacemaker function See Pace Art report No changes today  3.  Aortic Stenosis S/p AVR 2006  3.  Hypertension Stable  No changes today  [facility] Return to see EP NP in 3 months.  Will consider cardioversion if more symptoms then.  If he remains asymptomatic, rate control will be a reasonable long term strategy I will see in a year   Hillis Range MD 06/15/2016 3:11 PM

## 2016-06-15 NOTE — Patient Instructions (Signed)
Medication Instructions:  Your physician recommends that you continue on your current medications as directed. Please refer to the Current Medication list given to you today.   Labwork: None ordered   Testing/Procedures: None ordered   Follow-Up: Your physician recommends that you schedule a follow-up appointment in: 3 months with Penne LashAmber Seiler,NP   Your physician wants you to follow-up in: 12 months with Dr Johney FrameAllred Bonita QuinYou will receive a reminder letter in the mail two months in advance. If you don't receive a letter, please call our office to schedule the follow-up appointment.    Any Other Special Instructions Will Be Listed Below (If Applicable).     If you need a refill on your cardiac medications before your next appointment, please call your pharmacy.

## 2016-06-16 LAB — CUP PACEART INCLINIC DEVICE CHECK
Battery Impedance: 306 Ohm
Battery Remaining Longevity: 100 mo
Battery Voltage: 2.78 V
Brady Statistic AP VP Percent: 4 %
Date Time Interrogation Session: 20180129210731
Implantable Lead Implant Date: 20060326
Implantable Lead Location: 753860
Implantable Lead Model: 5076
Implantable Pulse Generator Implant Date: 20120817
Lead Channel Impedance Value: 465 Ohm
Lead Channel Sensing Intrinsic Amplitude: 15.67 mV
Lead Channel Setting Pacing Amplitude: 2 V
Lead Channel Setting Pacing Amplitude: 2.5 V
Lead Channel Setting Pacing Pulse Width: 0.4 ms
Lead Channel Setting Sensing Sensitivity: 5.6 mV
MDC IDC LEAD IMPLANT DT: 20060326
MDC IDC LEAD LOCATION: 753859
MDC IDC MSMT LEADCHNL RA SENSING INTR AMPL: 0.7 mV
MDC IDC MSMT LEADCHNL RV IMPEDANCE VALUE: 394 Ohm
MDC IDC MSMT LEADCHNL RV PACING THRESHOLD AMPLITUDE: 0.5 V
MDC IDC MSMT LEADCHNL RV PACING THRESHOLD PULSEWIDTH: 0.4 ms
MDC IDC STAT BRADY AP VS PERCENT: 84 %
MDC IDC STAT BRADY AS VP PERCENT: 0 %
MDC IDC STAT BRADY AS VS PERCENT: 12 %

## 2016-06-19 DIAGNOSIS — H1013 Acute atopic conjunctivitis, bilateral: Secondary | ICD-10-CM | POA: Diagnosis not present

## 2016-06-30 DIAGNOSIS — Z0000 Encounter for general adult medical examination without abnormal findings: Secondary | ICD-10-CM | POA: Diagnosis not present

## 2016-06-30 DIAGNOSIS — E7800 Pure hypercholesterolemia, unspecified: Secondary | ICD-10-CM | POA: Diagnosis not present

## 2016-06-30 DIAGNOSIS — E038 Other specified hypothyroidism: Secondary | ICD-10-CM | POA: Diagnosis not present

## 2016-06-30 DIAGNOSIS — Z125 Encounter for screening for malignant neoplasm of prostate: Secondary | ICD-10-CM | POA: Diagnosis not present

## 2016-07-06 ENCOUNTER — Telehealth: Payer: Self-pay | Admitting: Internal Medicine

## 2016-07-06 NOTE — Telephone Encounter (Signed)
I reviewed with Dr Katrinka BlazingSmith- No new recommendations at this time, observe, call if any more symptoms.  I discussed with pt's wife, she was good with this plan.

## 2016-07-06 NOTE — Telephone Encounter (Signed)
Pt states he woke up about midnight last night with a sharp chest pain that lasted about 2 minutes.  Pt denies any other symptoms at that time including lightheadedness, shortness of breath, nausea, tachycardia, pr palpitations.  Pt states he feel fine this morning, no symptoms.  Pt states he has never had chest pain like this in the past.  Pt states he ate about 5 PM yesterday and does not have a history of reflux. Pt is going to send in a remote transmission, device will review transmission. I will review with Dr Katrinka BlazingSmith (DOD) once transmission has been received and reviewed.

## 2016-07-06 NOTE — Telephone Encounter (Signed)
New message  Pt c/o of Chest Pain: STAT if CP now or developed within 24 hours  1. Are you having CP right now? No   2. Are you experiencing any other symptoms (ex. SOB, nausea, vomiting, sweating)? No   3. How long have you been experiencing CP? Last night   4. Is your CP continuous or coming and going? Coming and going   5. Have you taken Nitroglycerin? No  ?

## 2016-07-06 NOTE — Telephone Encounter (Signed)
I spoke with Lanora ManisElizabeth in [facility], remote transmission okay.  I will review with Dr Katrinka BlazingSmith.

## 2016-07-07 ENCOUNTER — Ambulatory Visit (INDEPENDENT_AMBULATORY_CARE_PROVIDER_SITE_OTHER): Payer: PPO | Admitting: *Deleted

## 2016-07-07 DIAGNOSIS — I119 Hypertensive heart disease without heart failure: Secondary | ICD-10-CM | POA: Diagnosis not present

## 2016-07-07 DIAGNOSIS — I359 Nonrheumatic aortic valve disorder, unspecified: Secondary | ICD-10-CM

## 2016-07-07 DIAGNOSIS — Z0000 Encounter for general adult medical examination without abnormal findings: Secondary | ICD-10-CM | POA: Diagnosis not present

## 2016-07-07 DIAGNOSIS — I25810 Atherosclerosis of coronary artery bypass graft(s) without angina pectoris: Secondary | ICD-10-CM | POA: Diagnosis not present

## 2016-07-07 DIAGNOSIS — E038 Other specified hypothyroidism: Secondary | ICD-10-CM | POA: Diagnosis not present

## 2016-07-07 DIAGNOSIS — Z6829 Body mass index (BMI) 29.0-29.9, adult: Secondary | ICD-10-CM | POA: Diagnosis not present

## 2016-07-07 DIAGNOSIS — N401 Enlarged prostate with lower urinary tract symptoms: Secondary | ICD-10-CM | POA: Diagnosis not present

## 2016-07-07 DIAGNOSIS — Z5181 Encounter for therapeutic drug level monitoring: Secondary | ICD-10-CM | POA: Diagnosis not present

## 2016-07-07 DIAGNOSIS — I350 Nonrheumatic aortic (valve) stenosis: Secondary | ICD-10-CM

## 2016-07-07 DIAGNOSIS — G20 Parkinson's disease: Secondary | ICD-10-CM | POA: Diagnosis not present

## 2016-07-07 DIAGNOSIS — E7800 Pure hypercholesterolemia, unspecified: Secondary | ICD-10-CM | POA: Diagnosis not present

## 2016-07-07 DIAGNOSIS — Z1389 Encounter for screening for other disorder: Secondary | ICD-10-CM | POA: Diagnosis not present

## 2016-07-07 DIAGNOSIS — D692 Other nonthrombocytopenic purpura: Secondary | ICD-10-CM | POA: Diagnosis not present

## 2016-07-07 DIAGNOSIS — I482 Chronic atrial fibrillation: Secondary | ICD-10-CM | POA: Diagnosis not present

## 2016-07-07 DIAGNOSIS — Z950 Presence of cardiac pacemaker: Secondary | ICD-10-CM | POA: Diagnosis not present

## 2016-07-07 LAB — POCT INR: INR: 3.9

## 2016-07-14 ENCOUNTER — Other Ambulatory Visit: Payer: Self-pay | Admitting: Internal Medicine

## 2016-07-14 ENCOUNTER — Other Ambulatory Visit: Payer: Self-pay | Admitting: Cardiovascular Disease

## 2016-07-16 ENCOUNTER — Encounter: Payer: Self-pay | Admitting: Nurse Practitioner

## 2016-07-20 DIAGNOSIS — Z86010 Personal history of colonic polyps: Secondary | ICD-10-CM | POA: Diagnosis not present

## 2016-07-21 ENCOUNTER — Ambulatory Visit (INDEPENDENT_AMBULATORY_CARE_PROVIDER_SITE_OTHER): Payer: PPO | Admitting: *Deleted

## 2016-07-21 DIAGNOSIS — I359 Nonrheumatic aortic valve disorder, unspecified: Secondary | ICD-10-CM | POA: Diagnosis not present

## 2016-07-21 DIAGNOSIS — I350 Nonrheumatic aortic (valve) stenosis: Secondary | ICD-10-CM | POA: Diagnosis not present

## 2016-07-21 DIAGNOSIS — Z5181 Encounter for therapeutic drug level monitoring: Secondary | ICD-10-CM

## 2016-07-21 LAB — POCT INR: INR: 4

## 2016-07-30 ENCOUNTER — Telehealth: Payer: Self-pay | Admitting: Pharmacist

## 2016-07-30 NOTE — Telephone Encounter (Signed)
Received fax from [facility] that patient is scheduled for a colonoscopy on 08/31/2016. Patient on Coumadin for atrial fibrillation with mechanical valve. Recommended to hold Coumadin 5 days prior to procedure with Lovenox bridge. Note added to upcoming INR check to coordinate bridge.   Faxed back to [facility] - [phone].

## 2016-08-04 ENCOUNTER — Ambulatory Visit (INDEPENDENT_AMBULATORY_CARE_PROVIDER_SITE_OTHER): Payer: PPO | Admitting: *Deleted

## 2016-08-04 DIAGNOSIS — Z5181 Encounter for therapeutic drug level monitoring: Secondary | ICD-10-CM | POA: Diagnosis not present

## 2016-08-04 DIAGNOSIS — I350 Nonrheumatic aortic (valve) stenosis: Secondary | ICD-10-CM | POA: Diagnosis not present

## 2016-08-04 DIAGNOSIS — I359 Nonrheumatic aortic valve disorder, unspecified: Secondary | ICD-10-CM

## 2016-08-04 LAB — POCT INR: INR: 3.7

## 2016-08-23 NOTE — Progress Notes (Signed)
Cardiology Office Note Date:  08/25/2016  Patient ID:  Donald [name], Donald [name] 10-Jun-1940, MRN [id number] PCP:  Haywood Pao, MD  Electrophysiologist: Dr. Rayann Heman   Chief Complaint:  pre-colonoscopy   History of Present Illness: Donald [name] is a 76 y.o. male with history of persistent AFib, SSSx w/PPM, VHD w/mechanical AVR, CAD s/p CABG 2006, Parkinson's, GERD, HTN   He comes to the office today to be seen for Allred, last seen by him in January, at that time unaware of his AF and rate control strategy planned going forward unless he became symptomatic.  He is planned for colonoscopy, coumadin clinic has arranged 5 days off coumadin with lovenox bridging via [place].    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  He denies any kind of CP, palpitations, no dizziness, near syncope or syncope.  He follows with the coumadin clinic, denies any bleeding or signs of bleeding.  He is pending a colonoscopy, had his lovenox bridge kit and has been instructed/educated already with the clinic on how to bridge off/back on coumadin.  This is also nt his first time doing this.  The patient/wife are copfortable with the process.  This is a routine 10 year colonoscopy.   DEVICE information: MDT dual chamber PPM, implanted 08/10/04, gen change 01/02/11, Dr. Rayann Heman.  SIck sInus syndrome   Past Medical History:  Diagnosis Date  . Abnormality of gait 08/27/2014  . Aortic stenosis    s/p AVR by Dr Cyndia Bent  . CAD (coronary artery disease)    s/p CABG 2006  . GERD (gastroesophageal reflux disease)   . H/O: rheumatic fever   . Hearing deficit    Bilateral hearing aids  . Hiatal hernia   . Hyperlipidemia   . Hypertension   .  Hyperthyroidism   . Memory difficulty 08/27/2014  . Pacemaker   . Parkinson's disease   . Persistent atrial fibrillation ([place])   . Right kidney stone    hx of  . Shortness of breath   . Sick sinus syndrome ([place])    s/p PPM (MDT) 2006    Past Surgical History:  Procedure Laterality Date  . AORTIC VALVE REPLACEMENT  2006  . CARDIOVERSION N/A 09/14/2013   Procedure: CARDIOVERSION;  Surgeon: Sanda Klein, MD;  Location: [facility];  Service: Cardiovascular;  Laterality: N/A;  . CARDIOVERSION  09-14-13  . CHOLECYSTECTOMY N/A 07/21/2013   Procedure: LAPAROSCOPIC CHOLECYSTECTOMY;  Surgeon: Edward Jolly, MD;  Location: [facility];  Service: General;  Laterality: N/A;  . cholescystectomy  09-14-13  . CORONARY ARTERY BYPASS GRAFT  2006  . DENTAL SURGERY     bridge  . INSERT / REPLACE / REMOVE PACEMAKER  2006, 2012   MDT for sick sinus syndrome, gen change 8/12 by JA  . KIDNEY STONE SURGERY      Current Outpatient Prescriptions  Medication Sig Dispense Refill  . aspirin 81 MG tablet Take 81 mg by mouth daily.      Marland Kitchen atorvastatin (LIPITOR) 20 MG tablet Take 1 tablet by mouth daily 90 tablet 3  . carbidopa-levodopa (SINEMET) 25-100 MG tablet 1.5 tablets in the morning, midday, and evening    .  esomeprazole (NEXIUM) 20 MG capsule Take 20 mg by mouth daily at 12 noon.    Marland Kitchen levothyroxine (SYNTHROID, LEVOTHROID) 125 MCG tablet Take 125 mcg by mouth daily.      . metoprolol succinate (TOPROL-XL) 25 MG 24 hr tablet Take 1 tablet by mouth once daily 90 tablet 3  . nitroGLYCERIN (NITROSTAT) 0.4 MG SL tablet Place 1 tablet (0.4 mg total) under the tongue every 5 (five) minutes x 3 doses as needed for chest pain. 25 tablet 6  . rOPINIRole (REQUIP) 1 MG tablet Take 1 tablet by mouth 3 times a day 270 tablet 3  . Triamcinolone Acetonide (NASACORT ALLERGY 24HR NA) Place 2 sprays into the nose daily.     . trihexyphenidyl (ARTANE) 2 MG tablet Take 0.5 tablets (1 mg total) by mouth 2 (two) times daily  with a meal.    . warfarin (COUMADIN) 5 MG tablet Take as directed by coumadin clinic 135 tablet 0  . enoxaparin (LOVENOX) 80 MG/0.8ML injection Inject 0.8 mLs (80 mg total) into the skin every 12 (twelve) hours. 20 Syringe 1   No current facility-administered medications for this visit.     Allergies:   Sulfa antibiotics   Social History:  The patient  reports that he has never smoked. He has never used smokeless tobacco. He reports that he does not drink alcohol or use drugs.   Family History:  The patient's family history includes Dementia in his mother; Heart disease in his brother, father, and sister; Parkinsonism in his mother; Tremor in his brother.  ROS:  Please see the history of present illness.    All other systems are reviewed and otherwise negative.   PHYSICAL EXAM:  VS:  BP 120/72   Pulse 71   Ht _0  (1.803 m)   Wt 196 lb (88.9 kg)   BMI 27.34 kg/m  BMI: Body mass index is 27.34 kg/m. Well nourished, well developed, in no acute distress  HEENT: normocephalic, atraumatic  Neck: no JVD, carotid bruits or masses Cardiac:  RRR; 2/6 SM, valve is appreciated,  no rubs, or gallops Lungs:  CTA b/l, no wheezing, rhonchi or rales  Abd: soft, nontender MS: no deformity or atrophy Ext: no edema  Skin: warm and dry, no rash Neuro:  No gross deficits appreciated, resting tremor R>L hand Psych: euthymic mood, full affect  PPM site is stable, no tethering or discomfort   EKG:  Done today shows AF w/V paced rhythm Pacemaker interrogation done today with industry, reviewed by myself: stable lead and battery measurements, he has been in AF since his last interrogation, no other observations  04/18/13: TTE Study Conclusions - Left ventricle: The cavity size was normal. Wall thickness was increased in a pattern of mild LVH. Systolic function was normal. The estimated ejection fraction was in the range of 55% to 60%. Wall motion was normal; there were no regional  wall motion abnormalities. Doppler parameters are consistent with abnormal left ventricular relaxation (grade 1 diastolic dysfunction). - Aortic valve: A mechanical prosthesis was present. - Mitral valve: Calcified annulus. Mildly thickened leaflets - Left atrium: The atrium was mildly dilated. - Right ventricle: The cavity size was mildly dilated. - Right atrium: The atrium was mildly dilated. Impressions: - Mechanical aortic valve with mildly elevated mean gradient of 25 mmHg; no old study for comparison.   Recent Labs: No results found for requested labs within last 8760 hours.  No results found for requested labs within last 8760 hours.   CrCl  cannot be calculated (Patient's most recent lab result is older than the maximum 21 days allowed.).   Wt Readings from Last 3 Encounters:  08/25/16 196 lb (88.9 kg)  06/15/16 197 lb 9.6 oz (89.6 kg)  06/08/16 192 lb (87.1 kg)     Other studies reviewed: Additional studies/records reviewed today include: summarized above  ASSESSMENT AND PLAN:  1. SSSx w/PPM     normal device function, no changes made  2. VHD w/mechanical AVR     Chronically on coumadin, monitored and managed by coumadin clinic     He They are aware of SBE prophylaxis, (not needed for GI procedure)  3. Persistent AFib     CHA2DS2Vasc is 4, on warfarin     He is unaware of his AF, no physical limitations or c/o, will not plan for rhythm control strategy, especially given his upcoming GI procedure     They are aware of procedure for bridge process, coumadin clinic has provided lovenox bridge kit, and instructions  4. CAD     He is very active physically and without any kind iof symptomatology or intolerances.     On ASA, statin, BB  5. HTN     Stable, no need for change   Disposition: I have reviewed the case and EKG with dr. Tamala Julian (DOD), given low risk procedure and physically active patient without symptoms, no need for ischemic w/u or further  evaluation prior to colonoscopy.  F/u with 3 month remote pacer check, 6 months with Dr. Rayann Heman, sooner if needed.  they are instructed to reach out to GI and ask for instructions regarding his ASA pre-procedure as well.    Current medicines are reviewed at length with the patient today.  The patient did not have any concerns regarding medicines.  Haywood Lasso, PA-C 08/25/2016 8:14 AM     [facility] [address] [place] [address] [phone] (office)  [phone] (fax)

## 2016-08-24 ENCOUNTER — Encounter: Payer: Self-pay | Admitting: Physician Assistant

## 2016-08-25 ENCOUNTER — Ambulatory Visit (INDEPENDENT_AMBULATORY_CARE_PROVIDER_SITE_OTHER): Payer: PPO | Admitting: *Deleted

## 2016-08-25 ENCOUNTER — Ambulatory Visit (INDEPENDENT_AMBULATORY_CARE_PROVIDER_SITE_OTHER): Payer: PPO | Admitting: Physician Assistant

## 2016-08-25 VITALS — BP 120/72 | HR 71 | Ht 71.0 in | Wt 196.0 lb

## 2016-08-25 DIAGNOSIS — I350 Nonrheumatic aortic (valve) stenosis: Secondary | ICD-10-CM | POA: Diagnosis not present

## 2016-08-25 DIAGNOSIS — Z5181 Encounter for therapeutic drug level monitoring: Secondary | ICD-10-CM

## 2016-08-25 DIAGNOSIS — I2510 Atherosclerotic heart disease of native coronary artery without angina pectoris: Secondary | ICD-10-CM | POA: Diagnosis not present

## 2016-08-25 DIAGNOSIS — Z952 Presence of prosthetic heart valve: Secondary | ICD-10-CM | POA: Diagnosis not present

## 2016-08-25 DIAGNOSIS — I481 Persistent atrial fibrillation: Secondary | ICD-10-CM

## 2016-08-25 DIAGNOSIS — I359 Nonrheumatic aortic valve disorder, unspecified: Secondary | ICD-10-CM

## 2016-08-25 DIAGNOSIS — I10 Essential (primary) hypertension: Secondary | ICD-10-CM | POA: Diagnosis not present

## 2016-08-25 DIAGNOSIS — Z950 Presence of cardiac pacemaker: Secondary | ICD-10-CM

## 2016-08-25 DIAGNOSIS — I4819 Other persistent atrial fibrillation: Secondary | ICD-10-CM

## 2016-08-25 LAB — POCT INR: INR: 2.8

## 2016-08-25 MED ORDER — ENOXAPARIN SODIUM 80 MG/0.8ML ~~LOC~~ SOLN: 80.0000 mg | Freq: Two times a day (BID) | SUBCUTANEOUS | 1 refills | Status: DC

## 2016-08-25 NOTE — Patient Instructions (Addendum)
08/25/16: Last dose of Coumadin.  08/26/16: No Coumadin or Lovenox.  08/27/16: Inject Lovenox  in the fatty abdominal tissue at least 2 inches from the belly button twice a day about 12 hours apart, 8am and 8pm rotate sites. No Coumadin.  08/28/16: Inject Lovenox in the fatty tissue every 12 hours, 8am and 8pm. No Coumadin.  08/29/16: Inject Lovenox in the fatty tissue every 12 hours, 8am and 8pm. No Coumadin.  08/30/16: Inject Lovenox in the fatty tissue in the morning at 8 am (No PM dose). No Coumadin.  08/31/16: Procedure Day - No Lovenox - Resume Coumadin in the evening or as directed by doctor (take an extra half tablet with usual dose for 2 days then resume normal dose).  09/01/16: Resume Lovenox inject in the fatty tissue every 12 hours and take Coumadin.  09/02/16: Inject Lovenox in the fatty tissue every 12 hours and take Coumadin.  09/03/16: Inject Lovenox in the fatty tissue every 12 hours and take Coumadin.  09/04/16: Inject Lovenox in the fatty tissue every 12 hours and take Coumadin.  09/05/16: Inject Lovenox in the fatty tissue every 12 hours and take Coumadin.  09/06/16: Inject Lovenox in the fatty tissue every 12 hours and take Coumadin.  09/07/16: Coumadin appt to check INR.

## 2016-08-25 NOTE — Patient Instructions (Addendum)
Medication Instructions:   Your physician recommends that you continue on your current medications as directed. Please refer to the Current Medication list given to you today.   If you need a refill on your cardiac medications before your next appointment, please call your pharmacy.  Labwork: NONE ORDERED  TODAY    Testing/Procedures: NONE ORDERED  TODAY    Follow-Up:  Your physician wants you to follow-up in:  IN  6  MONTHS WITH DR Johney Frame  You will receive a reminder letter in the mail two months in advance. If you don't receive a letter, please call our office to schedule the follow-up appointment.  Remote monitoring is used to monitor your Pacemaker of ICD from home. This monitoring reduces the number of office visits required to check your device to one time per year. It allows [place] to keep an eye on the functioning of your device to ensure it is working properly. You are scheduled for a device check from home on 7*10*18 . You may send your transmission at any time that day. If you have a wireless device, the transmission will be sent automatically. After your physician reviews your transmission, you will receive a postcard with your next transmission date.      Any Other Special Instructions Will Be Listed Below (If Applicable).

## 2016-08-27 ENCOUNTER — Encounter (HOSPITAL_COMMUNITY): Payer: Self-pay | Admitting: *Deleted

## 2016-08-28 ENCOUNTER — Other Ambulatory Visit: Payer: Self-pay | Admitting: Gastroenterology

## 2016-08-31 ENCOUNTER — Ambulatory Visit (HOSPITAL_COMMUNITY): Payer: PPO | Admitting: Certified Registered Nurse Anesthetist

## 2016-08-31 ENCOUNTER — Encounter (HOSPITAL_COMMUNITY)
Admission: RE | Disposition: A | Discharge status: Home / Self Care | Payer: Self-pay | Source: Ambulatory Visit | Attending: Gastroenterology

## 2016-08-31 ENCOUNTER — Encounter (HOSPITAL_COMMUNITY): Payer: Self-pay | Admitting: Certified Registered Nurse Anesthetist

## 2016-08-31 ENCOUNTER — Ambulatory Visit (HOSPITAL_COMMUNITY)
Admission: RE | Admit: 2016-08-31 | Discharge: 2016-08-31 | Disposition: A | Discharge status: Home / Self Care | Payer: PPO | Source: Ambulatory Visit | Attending: Gastroenterology | Admitting: Gastroenterology

## 2016-08-31 DIAGNOSIS — Z860100 Personal history of colon polyps, unspecified: Secondary | ICD-10-CM

## 2016-08-31 DIAGNOSIS — K648 Other hemorrhoids: Secondary | ICD-10-CM | POA: Insufficient documentation

## 2016-08-31 DIAGNOSIS — E785 Hyperlipidemia, unspecified: Secondary | ICD-10-CM | POA: Diagnosis not present

## 2016-08-31 DIAGNOSIS — K5730 Diverticulosis of large intestine without perforation or abscess without bleeding: Secondary | ICD-10-CM | POA: Diagnosis not present

## 2016-08-31 DIAGNOSIS — Z1211 Encounter for screening for malignant neoplasm of colon: Secondary | ICD-10-CM | POA: Insufficient documentation

## 2016-08-31 DIAGNOSIS — K640 First degree hemorrhoids: Secondary | ICD-10-CM | POA: Diagnosis not present

## 2016-08-31 HISTORY — DX: Unspecified asthma, uncomplicated: J45.909

## 2016-08-31 HISTORY — DX: Anxiety disorder, unspecified: F41.9

## 2016-08-31 HISTORY — DX: Unspecified hearing loss, unspecified ear: H91.90

## 2016-08-31 HISTORY — PX: COLONOSCOPY WITH PROPOFOL: SHX5780

## 2016-08-31 HISTORY — DX: Allergy status to unspecified drugs, medicaments and biological substances: Z88.9

## 2016-08-31 HISTORY — DX: Personal history of urinary calculi: Z87.442

## 2016-08-31 HISTORY — DX: Unspecified osteoarthritis, unspecified site: M19.90

## 2016-08-31 HISTORY — DX: Scarlet fever, uncomplicated: A38.9

## 2016-08-31 LAB — POCT I-STAT, CHEM 8
BUN: 15 mg/dL (ref 6–20)
CALCIUM ION: 1.19 mmol/L (ref 1.15–1.40)
CREATININE: 0.7 mg/dL (ref 0.61–1.24)
Chloride: 105 mmol/L (ref 101–111)
GLUCOSE: 87 mg/dL (ref 65–99)
HCT: 39 % (ref 39.0–52.0)
HEMOGLOBIN: 13.3 g/dL (ref 13.0–17.0)
Potassium: 4.1 mmol/L (ref 3.5–5.1)
Sodium: 140 mmol/L (ref 135–145)
TCO2: 27 mmol/L (ref 0–100)

## 2016-08-31 LAB — PROTIME-INR
INR: 1.17
PROTHROMBIN TIME: 15 s (ref 11.4–15.2)

## 2016-08-31 SURGERY — COLONOSCOPY WITH PROPOFOL
Anesthesia: Monitor Anesthesia Care

## 2016-08-31 MED ORDER — LACTATED RINGERS IV SOLN
INTRAVENOUS | Status: DC
  Administered 2016-08-31: 1000 mL via INTRAVENOUS

## 2016-08-31 MED ORDER — SODIUM CHLORIDE 0.9 % IV SOLN: INTRAVENOUS | Status: DC

## 2016-08-31 MED ORDER — SODIUM CHLORIDE 0.9 % IV SOLN
INTRAVENOUS | Status: DC | PRN
  Administered 2016-08-31: 09:00:00 via INTRAVENOUS

## 2016-08-31 MED ORDER — PROPOFOL 500 MG/50ML IV EMUL
INTRAVENOUS | Status: DC | PRN
  Administered 2016-08-31: 50 ug/kg/min via INTRAVENOUS

## 2016-08-31 SURGICAL SUPPLY — 21 items

## 2016-08-31 NOTE — Anesthesia Procedure Notes (Signed)
Procedure Name: MAC Date/Time: 08/31/2016 8:31 AM Performed by: Tressia Miners LEFFEW Pre-anesthesia Checklist: Patient identified, Emergency Drugs available, Suction available, Timeout performed and Patient being monitored Patient Re-evaluated:Patient Re-evaluated prior to inductionOxygen Delivery Method: Simple face mask Placement Confirmation: positive ETCO2

## 2016-08-31 NOTE — Discharge Instructions (Signed)
Resume Coumadin as previously directed.

## 2016-08-31 NOTE — Transfer of Care (Signed)
Immediate Anesthesia Transfer of Care Note  Patient: Donald [name]  Procedure(s) Performed: Procedure(s): COLONOSCOPY WITH PROPOFOL (N/A)  Patient Location: Endoscopy Unit  Anesthesia Type:MAC  Level of Consciousness: awake, patient cooperative and responds to stimulation  Airway & Oxygen Therapy: Patient Spontanous Breathing and Patient connected to face mask oxygen  Post-op Assessment: Report given to RN, Post -op Vital signs reviewed and stable and Patient moving all extremities X 4  Post vital signs: Reviewed and stable  Last Vitals:  Vitals:   08/31/16 0727  BP: 131/69  Pulse: 64  Resp: 12  Temp: 36.6 C    Last Pain:  Vitals:   08/31/16 0727  TempSrc: Oral         Complications: No apparent anesthesia complications

## 2016-08-31 NOTE — Anesthesia Preprocedure Evaluation (Signed)
Anesthesia Evaluation  Patient identified by MRN, date of birth, ID band  Reviewed: Allergy & Precautions, NPO status , Patient's Chart, lab work & pertinent test results  Airway Mallampati: II   Neck ROM: Full    Dental  (+) Teeth Intact, Caps   Pulmonary shortness of breath, asthma ,    breath sounds clear to auscultation       Cardiovascular hypertension, + CAD and + CABG  + pacemaker + Valvular Problems/Murmurs  Rhythm:Irregular Rate:Normal + Systolic murmurs    Neuro/Psych  Neuromuscular disease    GI/Hepatic hiatal hernia, GERD  ,(+) Hepatitis -  Endo/Other    Renal/GU      Musculoskeletal   Abdominal   Peds  Hematology   Anesthesia Other Findings   Reproductive/Obstetrics                             Anesthesia Physical Anesthesia Plan  ASA: III  Anesthesia Plan: MAC   Post-op Pain Management:    Induction: Intravenous  Airway Management Planned: Natural Airway and Simple Face Mask  Additional Equipment:   Intra-op Plan:   Post-operative Plan:   Informed Consent: I have reviewed the patients History and Physical, chart, labs and discussed the procedure including the risks, benefits and alternatives for the proposed anesthesia with the patient or authorized representative who has indicated his/her understanding and acceptance.   Dental advisory given  Plan Discussed with: CRNA  Anesthesia Plan Comments:         Anesthesia Quick Evaluation

## 2016-08-31 NOTE — Addendum Note (Signed)
Addendum  created 08/31/16 1303 by Margarita Rana, CRNA   Charge Capture section accepted

## 2016-08-31 NOTE — Anesthesia Postprocedure Evaluation (Addendum)
Anesthesia Post Note  Patient: Donald [name]  Procedure(s) Performed: Procedure(s) (LRB): COLONOSCOPY WITH PROPOFOL (N/A)  Patient location during evaluation: Endoscopy Anesthesia Type: MAC Level of consciousness: awake and alert Pain management: pain level controlled Vital Signs Assessment: post-procedure vital signs reviewed and stable Respiratory status: spontaneous breathing, nonlabored ventilation, respiratory function stable and patient connected to nasal cannula oxygen Cardiovascular status: stable and blood pressure returned to baseline Anesthetic complications: no       Last Vitals:  Vitals:   08/31/16 0920 08/31/16 0923  BP: (!) 143/111 (!) 143/111  Pulse: 78 70  Resp: (!) 25 (!) 23  Temp:      Last Pain:  Vitals:   08/31/16 0901  TempSrc: Oral                 Jayline Kilburg,JAMES TERRILL

## 2016-08-31 NOTE — Interval H&P Note (Signed)
History and Physical Interval Note:  08/31/2016 8:30 AM  Donald [name]  has presented today for surgery, with the diagnosis of polyps  The various methods of treatment have been discussed with the patient and family. After consideration of risks, benefits and other options for treatment, the patient has consented to  Procedure(s): COLONOSCOPY WITH PROPOFOL (N/A) as a surgical intervention .  The patient's history has been reviewed, patient examined, no change in status, stable for surgery.  I have reviewed the patient's chart and labs.  Questions were answered to the patient's satisfaction.     Belicia Difatta C.

## 2016-08-31 NOTE — H&P (Signed)
Date of Initial H&P: 08/21/16  History reviewed, patient examined, no change in status, stable for surgery.   

## 2016-08-31 NOTE — Op Note (Signed)
[facility] Patient Name: Donald [name] Procedure Date : 08/31/2016 MRN: [id number] Attending MD: Lear Ng , MD Date of Birth: 1941-01-21 CSN: [id number] Age: 76 Admit Type: Outpatient Procedure:                Colonoscopy Indications:              High risk colon cancer surveillance: Personal                            history of colonic polyps, Last colonoscopy:                            January 2013 Providers:                Lear Ng, MD, Jobe Igo, RN,                            Corliss Parish, Technician Referring MD:             Haywood Pao Medicines:                Propofol per Anesthesia, Monitored Anesthesia Care Complications:            No immediate complications. Estimated Blood Loss:     Estimated blood loss: none. Procedure:                Pre-Anesthesia Assessment:                           - Prior to the procedure, a History and Physical                            was performed, and patient medications and                            allergies were reviewed. The patient's tolerance of                            previous anesthesia was also reviewed. The risks                            and benefits of the procedure and the sedation                            options and risks were discussed with the patient.                            All questions were answered, and informed consent                            was obtained. Prior Anticoagulants: The patient has                            taken Coumadin (warfarin), last dose was 5 days  prior to procedure. ASA Grade Assessment: III - A                            patient with severe systemic disease. After                            reviewing the risks and benefits, the patient was                            deemed in satisfactory condition to undergo the                            procedure.                           After obtaining informed  consent, the colonoscope                            was passed under direct vision. Throughout the                            procedure, the patient's blood pressure, pulse, and                            oxygen saturations were monitored continuously. The                            EC-[id number] ([id number]) scope was introduced through                            the anus and advanced to the the cecum, identified                            by appendiceal orifice and ileocecal valve. The                            colonoscopy was performed without difficulty. The                            patient tolerated the procedure well. The quality                            of the bowel preparation was adequate and good. The                            ileocecal valve, appendiceal orifice, and rectum                            were photographed. Scope In: 8:45:47 AM Scope Out: 8:54:08 AM Scope Withdrawal Time: 0 hours 6 minutes 7 seconds  Total Procedure Duration: 0 hours 8 minutes 21 seconds  Findings:      The perianal and digital rectal examinations were normal.      Multiple small and large-mouthed diverticula were found in the entire  colon.      Internal hemorrhoids were found during retroflexion. The hemorrhoids       were medium-sized and Grade I (internal hemorrhoids that do not       prolapse). Impression:               - Diverticulosis in the entire examined colon.                           - Internal hemorrhoids.                           - No specimens collected. Moderate Sedation:      N/A - MAC procedure Recommendation:           - Patient has a contact number available for                            emergencies. The signs and symptoms of potential                            delayed complications were discussed with the                            patient. Return to normal activities tomorrow.                            Written discharge instructions were provided to the                             patient.                           - High fiber diet.                           - Repeat colonoscopy is not recommended for                            surveillance.                           - Resume Coumadin (warfarin) at prior dose today.                            Refer to managing physician for further adjustment                            of therapy. Procedure Code(s):        --- Professional ---                           [phone], Colonoscopy, flexible; diagnostic, including                            collection of specimen(s) by brushing or washing,  when performed (separate procedure) Diagnosis Code(s):        --- Professional ---                           Z86.010, Personal history of colonic polyps                           K64.0, First degree hemorrhoids                           K57.30, Diverticulosis of large intestine without                            perforation or abscess without bleeding CPT copyright 2016 American Medical Association. All rights reserved. The codes documented in this report are preliminary and upon coder review may  be revised to meet current compliance requirements. Lear Ng, MD 08/31/2016 9:03:19 AM This report has been signed electronically. Number of Addenda: 0

## 2016-09-03 ENCOUNTER — Encounter: Payer: PPO | Admitting: Nurse Practitioner

## 2016-09-07 ENCOUNTER — Ambulatory Visit (INDEPENDENT_AMBULATORY_CARE_PROVIDER_SITE_OTHER): Payer: PPO | Admitting: *Deleted

## 2016-09-07 DIAGNOSIS — I359 Nonrheumatic aortic valve disorder, unspecified: Secondary | ICD-10-CM | POA: Diagnosis not present

## 2016-09-07 DIAGNOSIS — I350 Nonrheumatic aortic (valve) stenosis: Secondary | ICD-10-CM | POA: Diagnosis not present

## 2016-09-07 DIAGNOSIS — Z5181 Encounter for therapeutic drug level monitoring: Secondary | ICD-10-CM | POA: Diagnosis not present

## 2016-09-07 LAB — POCT INR: INR: 2.2

## 2016-09-14 DIAGNOSIS — H6123 Impacted cerumen, bilateral: Secondary | ICD-10-CM | POA: Diagnosis not present

## 2016-09-14 DIAGNOSIS — Z6829 Body mass index (BMI) 29.0-29.9, adult: Secondary | ICD-10-CM | POA: Diagnosis not present

## 2016-09-14 DIAGNOSIS — I482 Chronic atrial fibrillation: Secondary | ICD-10-CM | POA: Diagnosis not present

## 2016-09-14 DIAGNOSIS — H900 Conductive hearing loss, bilateral: Secondary | ICD-10-CM | POA: Diagnosis not present

## 2016-09-14 DIAGNOSIS — L729 Follicular cyst of the skin and subcutaneous tissue, unspecified: Secondary | ICD-10-CM | POA: Diagnosis not present

## 2016-09-16 ENCOUNTER — Encounter: Payer: PPO | Admitting: Nurse Practitioner

## 2016-09-18 ENCOUNTER — Ambulatory Visit (INDEPENDENT_AMBULATORY_CARE_PROVIDER_SITE_OTHER): Payer: PPO | Admitting: Pharmacist

## 2016-09-18 DIAGNOSIS — I350 Nonrheumatic aortic (valve) stenosis: Secondary | ICD-10-CM

## 2016-09-18 DIAGNOSIS — I359 Nonrheumatic aortic valve disorder, unspecified: Secondary | ICD-10-CM | POA: Diagnosis not present

## 2016-09-18 DIAGNOSIS — Z5181 Encounter for therapeutic drug level monitoring: Secondary | ICD-10-CM | POA: Diagnosis not present

## 2016-09-18 LAB — POCT INR: INR: 3.2

## 2016-09-22 ENCOUNTER — Telehealth: Payer: Self-pay | Admitting: Neurology

## 2016-09-22 MED ORDER — ALPRAZOLAM 0.25 MG PO TABS: 0.2500 mg | ORAL_TABLET | Freq: Two times a day (BID) | ORAL | 1 refills | Status: DC

## 2016-09-22 NOTE — Telephone Encounter (Signed)
Faxed printed/signed rx alprazolam to pt pharmacy. Fax: [phone]. Received confirmation.  

## 2016-09-22 NOTE — Addendum Note (Signed)
Addended by: York SpanielWILLIS, CHARLES K on: 09/22/2016 11:38 AM   Modules accepted: Orders

## 2016-09-22 NOTE — Telephone Encounter (Signed)
Pt wife called stating pt asked her to call to request an increase in his medication for tremors  trihexyphenidyl (ARTANE) 2 MG tablet.  They have worsen to a point that he has complained and pt is not a complainer, please contact.

## 2016-09-22 NOTE — Telephone Encounter (Signed)
I called the patient, talk with the wife. There has been some increase in the tremors recently. He is on Artane taking one half of a 2 mg tablet twice daily. He has developed mild memory issues, I do not wish to go up on Artane for this reason. We may have to stop Artane in the future.  The patient is already on a beta blocker, and may try a low dose of alprazolam, 0.25 mg twice daily.

## 2016-09-24 DIAGNOSIS — M79661 Pain in right lower leg: Secondary | ICD-10-CM | POA: Diagnosis not present

## 2016-09-24 DIAGNOSIS — R2241 Localized swelling, mass and lump, right lower limb: Secondary | ICD-10-CM | POA: Diagnosis not present

## 2016-09-28 ENCOUNTER — Telehealth: Payer: Self-pay | Admitting: Neurology

## 2016-09-28 NOTE — Addendum Note (Signed)
Addended by: York SpanielWILLIS, Tela Kotecki K on: 09/28/2016 01:32 PM   Modules accepted: Orders

## 2016-09-28 NOTE — Telephone Encounter (Signed)
Patients wife called in reference to ALPRAZolam Prudy Feeler(XANAX) 0.25 MG tablet been taking medication since Thursday.  Seems medication has slowed his body down patients wife states patient made the comment yesterday "he just wants to crawl in a hole".  Wife would like to speak with Dr. Anne HahnWillis' about medication and if she should stop it.  Please call

## 2016-09-28 NOTE — Telephone Encounter (Signed)
The patient does not feel good on the alprazolam, he will stop the drug. The wife asked about CBD oil, okay to use this if the patient desires. This is an over-the-counter product.

## 2016-10-06 ENCOUNTER — Ambulatory Visit (INDEPENDENT_AMBULATORY_CARE_PROVIDER_SITE_OTHER): Payer: PPO | Admitting: *Deleted

## 2016-10-06 DIAGNOSIS — I350 Nonrheumatic aortic (valve) stenosis: Secondary | ICD-10-CM

## 2016-10-06 DIAGNOSIS — Z5181 Encounter for therapeutic drug level monitoring: Secondary | ICD-10-CM

## 2016-10-06 DIAGNOSIS — I359 Nonrheumatic aortic valve disorder, unspecified: Secondary | ICD-10-CM | POA: Diagnosis not present

## 2016-10-06 LAB — POCT INR: INR: 2.7

## 2016-10-07 DIAGNOSIS — R2241 Localized swelling, mass and lump, right lower limb: Secondary | ICD-10-CM | POA: Diagnosis not present

## 2016-10-16 NOTE — Addendum Note (Signed)
Addendum  created 10/16/16 1352 by Keriana Sarsfield, MD   Sign clinical note    

## 2016-10-22 ENCOUNTER — Telehealth: Payer: Self-pay | Admitting: *Deleted

## 2016-10-22 NOTE — Telephone Encounter (Signed)
Gave completed/signed DM[employer] parking placard application to medical records to process.

## 2016-10-27 ENCOUNTER — Ambulatory Visit (INDEPENDENT_AMBULATORY_CARE_PROVIDER_SITE_OTHER): Payer: PPO | Admitting: *Deleted

## 2016-10-27 DIAGNOSIS — I359 Nonrheumatic aortic valve disorder, unspecified: Secondary | ICD-10-CM | POA: Diagnosis not present

## 2016-10-27 DIAGNOSIS — I350 Nonrheumatic aortic (valve) stenosis: Secondary | ICD-10-CM | POA: Diagnosis not present

## 2016-10-27 DIAGNOSIS — Z5181 Encounter for therapeutic drug level monitoring: Secondary | ICD-10-CM

## 2016-10-27 LAB — POCT INR: INR: 3.5

## 2016-11-02 ENCOUNTER — Ambulatory Visit (INDEPENDENT_AMBULATORY_CARE_PROVIDER_SITE_OTHER): Payer: PPO | Admitting: Neurology

## 2016-11-02 ENCOUNTER — Encounter: Payer: Self-pay | Admitting: Neurology

## 2016-11-02 VITALS — BP 135/70 | HR 71 | Wt 190.5 lb

## 2016-11-02 DIAGNOSIS — G20 Parkinson's disease: Secondary | ICD-10-CM | POA: Diagnosis not present

## 2016-11-02 MED ORDER — CARBIDOPA-LEVODOPA 25-100 MG PO TABS: ORAL_TABLET | ORAL | 2 refills | Status: DC

## 2016-11-02 NOTE — Patient Instructions (Signed)
   We will go up on the Sinemet 25/100 taking 2 tablets in the morning and 1.5 tablets at midday and evening.  Sinemet (carbidopa) may result in confusion or hallucinations, drowsiness, nausea, or dizziness. If any significant side effects are noted, please contact our office. Sinemet may not be well absorbed when taken with high protein meals, if tolerated it is best to take 30-45 minutes before you eat.

## 2016-11-02 NOTE — Progress Notes (Signed)
Reason for visit: Parkinson's disease Donald [name] is an 76 y.o. male  History of present illness:  Donald [name] is a 76 year old left-handed white male with a history of Parkinson's disease associated with prominent right greater than left upper extremity tremors. The patient also has tremors involving the lower extremities. Since last seen the wife indicates that the patient has become a bit less mobile, he is having some trouble getting up out of chairs and he is slowing down with his activities of daily living. He tries to remain active, he walks about 2 miles a day. The patient has worsening tremors, he is on Artane taking 1 mg twice daily, but he also reports some troubles with memory. He is on ropinirole taking 1 mg 3 times daily. He returns to this office for an evaluation. He has not had any falls, he denies any problems with swallowing or drooling. The wife believes that the memory is about the same as it was on the last revisit.  Past Medical History:  Diagnosis Date  . Abnormality of gait 08/27/2014  . Anxiety   . Aortic stenosis    s/p AVR by Dr Laneta SimmersBartle  . Arthritis    left wrist- probable- not diagonised  . Asthma    in the past  . CAD (coronary artery disease)    s/p CABG 2006  . GERD (gastroesophageal reflux disease)   . H/O seasonal allergies   . H/O: rheumatic fever   . Hearing deficit    Bilateral hearing aids  . Hiatal hernia   . History of kidney stones   . HOH (hard of hearing)   . Hyperlipidemia   . Hypertension   . Hyperthyroidism   . Memory difficulty 08/27/2014  . Pacemaker   . Parkinson's disease   . Persistent atrial fibrillation (HCC)   . Scarlet fever    as a child  . Shortness of breath   . Sick sinus syndrome (HCC)    s/p PPM (MDT) 2006    Past Surgical History:  Procedure Laterality Date  . AORTIC VALVE REPLACEMENT  2006  . CARDIOVERSION N/A 09/14/2013   Procedure: CARDIOVERSION;  Surgeon: Thurmon FairMihai Croitoru, MD;  Location: [facility] ENDOSCOPY;   Service: Cardiovascular;  Laterality: N/A;  . CHOLECYSTECTOMY N/A 07/21/2013   Procedure: LAPAROSCOPIC CHOLECYSTECTOMY;  Surgeon: Mariella SaaBenjamin T Hoxworth, MD;  Location: [facility] OR;  Service: General;  Laterality: N/A;  . COLONOSCOPY W/ POLYPECTOMY    . COLONOSCOPY WITH PROPOFOL N/A 08/31/2016   Procedure: COLONOSCOPY WITH PROPOFOL;  Surgeon: Charlott RakesVincent Schooler, MD;  [facility];  Service: Endoscopy;  Laterality: N/A;  . CORONARY ARTERY BYPASS GRAFT  2006  . DENTAL SURGERY     implants  . INSERT / REPLACE / REMOVE PACEMAKER  2006, 2012   MDT for sick sinus syndrome, gen change 8/12 by JA  . KIDNEY STONE SURGERY  1984    Family History  Problem Relation Age of Onset  . Dementia Mother   . Parkinsonism Mother   . Heart disease Father   . Heart disease Sister   . Heart disease Brother   . Tremor Brother        Unilateral tremor  . Coronary artery disease Unknown        family hx of    Social history:  reports that he has never smoked. He has never used smokeless tobacco. He reports that he does not drink alcohol or use drugs.    Allergies  Allergen Reactions  .  Sulfa Antibiotics     unknown    Medications:  Prior to Admission medications   Medication Sig Start Date End Date Taking? Authorizing Provider  aspirin 81 MG tablet Take 81 mg by mouth daily.     Yes [provider]  atorvastatin (LIPITOR) 20 MG tablet Take 1 tablet by mouth daily 07/15/16  Yes Allred, Fayrene Fearing, MD  carbidopa-levodopa (SINEMET) 25-100 MG tablet 1.5 tablets in the morning, midday, and evening Patient taking differently: 1.5 tablets in the morning, and evening.  1 tablet at midday 03/24/16  Yes York Spaniel, MD  esomeprazole (NEXIUM) 20 MG capsule Take 20 mg by mouth daily at 12 noon.   Yes [provider]  levothyroxine (SYNTHROID, LEVOTHROID) 125 MCG tablet Take 125 mcg by mouth daily.     Yes [provider]  metoprolol succinate (TOPROL-XL) 25 MG 24 hr tablet Take 1 tablet  by mouth once daily 07/15/16  Yes Allred, Fayrene Fearing, MD  nitroGLYCERIN (NITROSTAT) 0.4 MG SL tablet Place 1 tablet (0.4 mg total) under the tongue every 5 (five) minutes x 3 doses as needed for chest pain. 10/25/15  Yes Rosalio Macadamia, NP  rOPINIRole (REQUIP) 1 MG tablet Take 1 tablet by mouth 3 times a day 04/16/16  Yes York Spaniel, MD  Triamcinolone Acetonide (NASACORT ALLERGY 24HR NA) Place 2 sprays into the nose daily.    Yes [provider]  trihexyphenidyl (ARTANE) 2 MG tablet Take 0.5 tablets (1 mg total) by mouth 2 (two) times daily with a meal. 03/24/16  Yes York Spaniel, MD  warfarin (COUMADIN) 5 MG tablet Take as directed by coumadin clinic 07/14/16  Yes Nahser, Deloris Ping, MD    ROS:  Out of a complete 14 system review of symptoms, the patient complains only of the following symptoms, and all other reviewed systems are negative.  Activity change Leg swelling, heart murmur Walking difficulty Tremors  Blood pressure 135/70, pulse 71, weight 190 lb 8 oz (86.4 kg).  Physical Exam  General: The patient is alert and cooperative at the time of the examination.  Skin: No significant peripheral edema is noted.   Neurologic Exam  Mental status: The patient is alert and oriented x 3 at the time of the examination. The patient has apparent normal recent and remote memory, with an apparently normal attention span and concentration ability. Mini-Mental Status Examination done today shows a total score 28/30.   Cranial nerves: Facial symmetry is present. Speech is normal, no aphasia or dysarthria is noted. Extraocular movements are full. Visual fields are full. Masking of the face is seen. The patient is quite hard of hearing, bilateral hearing aids are in.  Motor: The patient has good strength in all 4 extremities.  Sensory examination: Soft touch sensation is symmetric on the face, arms, and legs.  Coordination: The patient has good finger-nose-finger and heel-to-shin  bilaterally. Prominent resting tremors are seen with the right greater than left upper extremities and with both lower extremities.  Gait and station: The patient is able to arise from a seated position with arms crossed. Once up, he is able to ambulated without assistance, he has fairly good stride and good turns. Prominent resting tremors are seen with the right greater than left upper extremities. Romberg is negative.  Reflexes: Deep tendon reflexes are symmetric.    Assessment/Plan:   1. Parkinson's disease  2. Mild gait disorder  3. Mild memory disorder  4. Tremor  The patient is having prominent resting tremors,  I do not wish to go up on the Artane secondary to the memory issue. The Artane likely will need to be discontinued in the future. The patient will go up on the Sinemet taking 2 of the 25/100 mg tablets in the morning, 1.5 tablets at midday and evening. The patient was given a prescription for the Sinemet. He will remain on the same dose of the Requip. He will follow-up in 5 months.    Marlan Palau MD 11/02/2016 12:05 PM  [facility] [address] [place], [place] [address]  Phone [phone] Fax [phone]

## 2016-11-04 ENCOUNTER — Telehealth: Payer: Self-pay | Admitting: Neurology

## 2016-11-04 NOTE — Telephone Encounter (Signed)
Called [facility] back. [facility]. Advised CW,MD just saw patient in office.  Per CW,MD last note "The patient will go up on the Sinemet taking 2 of the 25/100 mg tablets in the morning, 1.5 tablets at midday and evening. The patient was given a prescription for the Sinemet."  Gave my first and last name per request of automated system. Asked her to call back if she has further questions.

## 2016-11-04 NOTE — Telephone Encounter (Signed)
Marquila/[facility] [phone] needs to verify dose change for carbidopa-levodopa (SINEMET) 25-100 MG tablet

## 2016-11-19 ENCOUNTER — Other Ambulatory Visit: Payer: Self-pay | Admitting: Cardiovascular Disease

## 2016-11-24 ENCOUNTER — Ambulatory Visit (INDEPENDENT_AMBULATORY_CARE_PROVIDER_SITE_OTHER): Payer: PPO

## 2016-11-24 DIAGNOSIS — Z5181 Encounter for therapeutic drug level monitoring: Secondary | ICD-10-CM

## 2016-11-24 DIAGNOSIS — I359 Nonrheumatic aortic valve disorder, unspecified: Secondary | ICD-10-CM | POA: Diagnosis not present

## 2016-11-24 DIAGNOSIS — I350 Nonrheumatic aortic (valve) stenosis: Secondary | ICD-10-CM

## 2016-11-24 LAB — POCT INR: INR: 3.4

## 2016-12-07 ENCOUNTER — Ambulatory Visit: Payer: PPO | Admitting: Neurology

## 2016-12-22 ENCOUNTER — Ambulatory Visit (INDEPENDENT_AMBULATORY_CARE_PROVIDER_SITE_OTHER): Payer: PPO | Admitting: *Deleted

## 2016-12-22 DIAGNOSIS — I359 Nonrheumatic aortic valve disorder, unspecified: Secondary | ICD-10-CM | POA: Diagnosis not present

## 2016-12-22 DIAGNOSIS — I350 Nonrheumatic aortic (valve) stenosis: Secondary | ICD-10-CM | POA: Diagnosis not present

## 2016-12-22 DIAGNOSIS — Z5181 Encounter for therapeutic drug level monitoring: Secondary | ICD-10-CM

## 2016-12-22 LAB — POCT INR: INR: 3.8

## 2017-01-12 ENCOUNTER — Ambulatory Visit (INDEPENDENT_AMBULATORY_CARE_PROVIDER_SITE_OTHER): Payer: PPO | Admitting: *Deleted

## 2017-01-12 DIAGNOSIS — I359 Nonrheumatic aortic valve disorder, unspecified: Secondary | ICD-10-CM | POA: Diagnosis not present

## 2017-01-12 DIAGNOSIS — Z5181 Encounter for therapeutic drug level monitoring: Secondary | ICD-10-CM

## 2017-01-12 DIAGNOSIS — I350 Nonrheumatic aortic (valve) stenosis: Secondary | ICD-10-CM

## 2017-01-12 LAB — POCT INR: INR: 4.3

## 2017-01-14 DIAGNOSIS — H900 Conductive hearing loss, bilateral: Secondary | ICD-10-CM | POA: Diagnosis not present

## 2017-01-14 DIAGNOSIS — Z6828 Body mass index (BMI) 28.0-28.9, adult: Secondary | ICD-10-CM | POA: Diagnosis not present

## 2017-01-14 DIAGNOSIS — Z23 Encounter for immunization: Secondary | ICD-10-CM | POA: Diagnosis not present

## 2017-01-14 DIAGNOSIS — Z950 Presence of cardiac pacemaker: Secondary | ICD-10-CM | POA: Diagnosis not present

## 2017-01-14 DIAGNOSIS — Z7901 Long term (current) use of anticoagulants: Secondary | ICD-10-CM | POA: Diagnosis not present

## 2017-01-14 DIAGNOSIS — E7800 Pure hypercholesterolemia, unspecified: Secondary | ICD-10-CM | POA: Diagnosis not present

## 2017-01-14 DIAGNOSIS — E038 Other specified hypothyroidism: Secondary | ICD-10-CM | POA: Diagnosis not present

## 2017-01-14 DIAGNOSIS — I119 Hypertensive heart disease without heart failure: Secondary | ICD-10-CM | POA: Diagnosis not present

## 2017-01-14 DIAGNOSIS — G20 Parkinson's disease: Secondary | ICD-10-CM | POA: Diagnosis not present

## 2017-01-14 DIAGNOSIS — I25810 Atherosclerosis of coronary artery bypass graft(s) without angina pectoris: Secondary | ICD-10-CM | POA: Diagnosis not present

## 2017-01-14 DIAGNOSIS — Z952 Presence of prosthetic heart valve: Secondary | ICD-10-CM | POA: Diagnosis not present

## 2017-01-14 DIAGNOSIS — I482 Chronic atrial fibrillation: Secondary | ICD-10-CM | POA: Diagnosis not present

## 2017-01-16 ENCOUNTER — Other Ambulatory Visit: Payer: Self-pay | Admitting: Neurology

## 2017-01-16 ENCOUNTER — Other Ambulatory Visit: Payer: Self-pay | Admitting: Nurse Practitioner

## 2017-01-26 ENCOUNTER — Ambulatory Visit (INDEPENDENT_AMBULATORY_CARE_PROVIDER_SITE_OTHER): Payer: PPO | Admitting: *Deleted

## 2017-01-26 DIAGNOSIS — I481 Persistent atrial fibrillation: Secondary | ICD-10-CM | POA: Diagnosis not present

## 2017-01-26 DIAGNOSIS — I359 Nonrheumatic aortic valve disorder, unspecified: Secondary | ICD-10-CM | POA: Diagnosis not present

## 2017-01-26 DIAGNOSIS — I350 Nonrheumatic aortic (valve) stenosis: Secondary | ICD-10-CM | POA: Diagnosis not present

## 2017-01-26 DIAGNOSIS — Z5181 Encounter for therapeutic drug level monitoring: Secondary | ICD-10-CM

## 2017-01-26 DIAGNOSIS — I4819 Other persistent atrial fibrillation: Secondary | ICD-10-CM

## 2017-01-26 LAB — POCT INR: INR: 3.8

## 2017-01-31 DIAGNOSIS — H60391 Other infective otitis externa, right ear: Secondary | ICD-10-CM | POA: Diagnosis not present

## 2017-02-09 ENCOUNTER — Ambulatory Visit (INDEPENDENT_AMBULATORY_CARE_PROVIDER_SITE_OTHER): Payer: PPO | Admitting: *Deleted

## 2017-02-09 DIAGNOSIS — I350 Nonrheumatic aortic (valve) stenosis: Secondary | ICD-10-CM | POA: Diagnosis not present

## 2017-02-09 DIAGNOSIS — I359 Nonrheumatic aortic valve disorder, unspecified: Secondary | ICD-10-CM | POA: Diagnosis not present

## 2017-02-09 DIAGNOSIS — Z5181 Encounter for therapeutic drug level monitoring: Secondary | ICD-10-CM

## 2017-02-09 LAB — POCT INR: INR: 3.2

## 2017-03-02 ENCOUNTER — Ambulatory Visit (INDEPENDENT_AMBULATORY_CARE_PROVIDER_SITE_OTHER): Payer: PPO

## 2017-03-02 DIAGNOSIS — I350 Nonrheumatic aortic (valve) stenosis: Secondary | ICD-10-CM | POA: Diagnosis not present

## 2017-03-02 DIAGNOSIS — Z5181 Encounter for therapeutic drug level monitoring: Secondary | ICD-10-CM | POA: Diagnosis not present

## 2017-03-02 DIAGNOSIS — I359 Nonrheumatic aortic valve disorder, unspecified: Secondary | ICD-10-CM | POA: Diagnosis not present

## 2017-03-02 LAB — POCT INR: INR: 2.8

## 2017-03-12 DIAGNOSIS — G20 Parkinson's disease: Secondary | ICD-10-CM | POA: Diagnosis not present

## 2017-03-12 DIAGNOSIS — Z6828 Body mass index (BMI) 28.0-28.9, adult: Secondary | ICD-10-CM | POA: Diagnosis not present

## 2017-03-12 DIAGNOSIS — H6123 Impacted cerumen, bilateral: Secondary | ICD-10-CM | POA: Diagnosis not present

## 2017-03-12 DIAGNOSIS — H9193 Unspecified hearing loss, bilateral: Secondary | ICD-10-CM | POA: Diagnosis not present

## 2017-03-23 ENCOUNTER — Other Ambulatory Visit: Payer: Self-pay | Admitting: Cardiovascular Disease

## 2017-03-30 ENCOUNTER — Ambulatory Visit (INDEPENDENT_AMBULATORY_CARE_PROVIDER_SITE_OTHER): Payer: PPO | Admitting: *Deleted

## 2017-03-30 DIAGNOSIS — I359 Nonrheumatic aortic valve disorder, unspecified: Secondary | ICD-10-CM

## 2017-03-30 DIAGNOSIS — I350 Nonrheumatic aortic (valve) stenosis: Secondary | ICD-10-CM

## 2017-03-30 DIAGNOSIS — Z5181 Encounter for therapeutic drug level monitoring: Secondary | ICD-10-CM | POA: Diagnosis not present

## 2017-03-30 LAB — POCT INR: INR: 2.6

## 2017-03-30 NOTE — Patient Instructions (Addendum)
Continue taking 1 tablet daily except 1/2 tablet on Sundays and Thursdays   Recheck INR in 4 weeks.  Call [phone] call with any new medications or if scheduled for any other procedures

## 2017-04-01 ENCOUNTER — Ambulatory Visit: Payer: PPO | Admitting: Neurology

## 2017-04-01 ENCOUNTER — Telehealth: Payer: Self-pay | Admitting: Neurology

## 2017-04-01 ENCOUNTER — Encounter: Payer: Self-pay | Admitting: Neurology

## 2017-04-01 VITALS — BP 130/78 | HR 81 | Ht 71.0 in | Wt 190.0 lb

## 2017-04-01 DIAGNOSIS — G20 Parkinson's disease: Secondary | ICD-10-CM | POA: Diagnosis not present

## 2017-04-01 DIAGNOSIS — R269 Unspecified abnormalities of gait and mobility: Secondary | ICD-10-CM | POA: Diagnosis not present

## 2017-04-01 DIAGNOSIS — R413 Other amnesia: Secondary | ICD-10-CM | POA: Diagnosis not present

## 2017-04-01 MED ORDER — ROPINIROLE HCL 2 MG PO TABS: 2.0000 mg | ORAL_TABLET | Freq: Three times a day (TID) | ORAL | 3 refills | Status: DC

## 2017-04-01 MED ORDER — ROPINIROLE HCL 2 MG PO TABS: 2.0000 mg | ORAL_TABLET | Freq: Every day | ORAL | 3 refills | Status: DC

## 2017-04-01 NOTE — Telephone Encounter (Signed)
Right now, pt. Is taking ropinrole 2 mg.  Pt. Wife has a question about how he should be taking his medicine. Medicine should be increased. Please call wife with directions.  Pt. Saw Dr. Anne HahnWillis today. Contact: [phone]

## 2017-04-01 NOTE — Telephone Encounter (Signed)
I called the patient.  The prescription for Requip was written improperly, he had indicated to take it one at night, the prescription should say take 1 tablet 3 times daily, I corrected the prescription and we sent it to the pharmacy.

## 2017-04-01 NOTE — Patient Instructions (Signed)
   With the ropinerole, we will change to a 2 mg tablet.    Week 1-2:  1mg / 1mg / 2mg   Week 3-4: 2mg / 1mg / 2mg   Then begin 2 mg three times a day.  Requip (ropinirole) may result in confusion or hallucinations, drowsiness, dizziness, or nausea. If any significant side effects are noted, please contact our office.

## 2017-04-01 NOTE — Progress Notes (Signed)
Reason for visit: Parkinson's disease  Donald [name] is an 76 y.o. male  History of present illness:  Donald [name] is a 76 year old left-handed white male with a history of Parkinson's disease.  The patient has prominent tremors on both upper extremities, he takes Artane for this.  He has noted some mild trouble with memory, but he has not had any progression since last seen.  The patient is able to operate a motor vehicle well without any issues, but he cannot drive at night secondary to vision problems.  The patient is staying active, he [facility].  He sleeps well at night, he denies any vivid dreams.  The patient is eating and swallowing well, he denies any significant drowsiness or sleepiness on the medication during the day.  The patient returns to the office today for an evaluation.  He is on Sinemet, Artane, and Requip for his Parkinson's.  Past Medical History:  Diagnosis Date  . Abnormality of gait 08/27/2014  . Anxiety   . Aortic stenosis    s/p AVR by Dr Laneta SimmersBartle  . Arthritis    left wrist- probable- not diagonised  . Asthma    in the past  . CAD (coronary artery disease)    s/p CABG 2006  . GERD (gastroesophageal reflux disease)   . H/O seasonal allergies   . H/O: rheumatic fever   . Hearing deficit    Bilateral hearing aids  . Hiatal hernia   . History of kidney stones   . HOH (hard of hearing)   . Hyperlipidemia   . Hypertension   . Hyperthyroidism   . Memory difficulty 08/27/2014  . Pacemaker   . Parkinson's disease   . Persistent atrial fibrillation (HCC)   . Scarlet fever    as a child  . Shortness of breath   . Sick sinus syndrome (HCC)    s/p PPM (MDT) 2006    Past Surgical History:  Procedure Laterality Date  . AORTIC VALVE REPLACEMENT  2006  . CARDIOVERSION N/A 09/14/2013   Procedure: CARDIOVERSION;  Surgeon: Thurmon FairMihai Croitoru, MD;  Location: [facility] ENDOSCOPY;  Service: Cardiovascular;  Laterality: N/A;  . CHOLECYSTECTOMY N/A  07/21/2013   Procedure: LAPAROSCOPIC CHOLECYSTECTOMY;  Surgeon: Mariella SaaBenjamin T Hoxworth, MD;  Location: [facility] OR;  Service: General;  Laterality: N/A;  . COLONOSCOPY W/ POLYPECTOMY    . COLONOSCOPY WITH PROPOFOL N/A 08/31/2016   Procedure: COLONOSCOPY WITH PROPOFOL;  Surgeon: Charlott RakesVincent Schooler, MD;  [facility];  Service: Endoscopy;  Laterality: N/A;  . CORONARY ARTERY BYPASS GRAFT  2006  . DENTAL SURGERY     implants  . INSERT / REPLACE / REMOVE PACEMAKER  2006, 2012   MDT for sick sinus syndrome, gen change 8/12 by JA  . KIDNEY STONE SURGERY  1984    Family History  Problem Relation Age of Onset  . Dementia Mother   . Parkinsonism Mother   . Heart disease Father   . Heart disease Sister   . Heart disease Brother   . Tremor Brother        Unilateral tremor  . Coronary artery disease Unknown        family hx of    Social history:  reports that  has never smoked. he has never used smokeless tobacco. He reports that he does not drink alcohol or use drugs.    Allergies  Allergen Reactions  . Sulfa Antibiotics     unknown  Medications:  Prior to Admission medications   Medication Sig Start Date End Date Taking? Authorizing Provider  aspirin 81 MG tablet Take 81 mg by mouth daily.     Yes [provider]  atorvastatin (LIPITOR) 20 MG tablet Take 1 tablet by mouth daily 07/15/16  Yes Allred, Fayrene Fearing, MD  carbidopa-levodopa (SINEMET) 25-100 MG tablet 2 tablets in the morning and 1.5 tablets at midday and evening 11/02/16  Yes York Spaniel, MD  esomeprazole (NEXIUM) 20 MG capsule Take 20 mg by mouth daily at 12 noon.   Yes [provider]  levothyroxine (SYNTHROID, LEVOTHROID) 125 MCG tablet Take 125 mcg by mouth daily.     Yes [provider]  metoprolol succinate (TOPROL-XL) 25 MG 24 hr tablet Take 1 tablet by mouth once daily 07/15/16  Yes Allred, Fayrene Fearing, MD  nitroGLYCERIN (NITROSTAT) 0.4 MG SL tablet PLACE 1 TABLET UNDER TONGUE EVERY 5 MINUTES FOR 3  DOSES AS NEEDED FOR CHEST PAIN 01/19/17  Yes Rosalio Macadamia, NP  rOPINIRole (REQUIP) 1 MG tablet Take 1 tablet by mouth 3 times a day 04/16/16  Yes York Spaniel, MD  Triamcinolone Acetonide (NASACORT ALLERGY 24HR NA) Place 2 sprays into the nose daily.    Yes [provider]  trihexyphenidyl (ARTANE) 2 MG tablet Take 0.5 tablets (1 mg total) by mouth 2 (two) times daily with a meal. 03/24/16  Yes York Spaniel, MD  warfarin (COUMADIN) 5 MG tablet Take as directed by coumadin clinic 03/23/17  Yes Nahser, Deloris Ping, MD    ROS:  Out of a complete 14 system review of symptoms, the patient complains only of the following symptoms, and all other reviewed systems are negative.  Tremors  Blood pressure 130/78, pulse 81, height 5\' 11"  (1.803 m), weight 190 lb (86.2 kg).  Physical Exam  General: The patient is alert and cooperative at the time of the examination.  Skin: No significant peripheral edema is noted.   Neurologic Exam  Mental status: The patient is alert and oriented x 3 at the time of the examination. The patient has apparent normal recent and remote memory, with an apparently normal attention span and concentration ability.  Mini-Mental status examination done today shows a total score 30/30.   Cranial nerves: Facial symmetry is present. Speech is normal, no aphasia or dysarthria is noted. Extraocular movements are full. Visual fields are full.  Masking of the face is seen.  Motor: The patient has good strength in all 4 extremities.  Sensory examination: Soft touch sensation is symmetric on the face, arms, and legs.  Coordination: The patient has good finger-nose-finger and heel-to-shin bilaterally.  The patient has prominent resting tremors on the right greater than left upper extremity.  Gait and station: The patient is able to rise from a seated position with arms crossed.  Once up, he is able to ambulate independently, he has decreased arm swing on the right  as compared to the left.  Tremors are seen with walking.  Tandem gait is slightly unsteady, the patient is able to perform.. Romberg is negative. No drift is seen.  Reflexes: Deep tendon reflexes are symmetric.   Assessment/Plan:  1.  Parkinson's disease  2.  Mild memory disturbance  3.  Mild gait disturbance  The patient is doing relatively well currently on his medications, we will go up on the Requip as he is on very low dose currently.  We will phase in a 2 mg dose over the next 1 month.  He will stay on the current dose of Sinemet and Artane, he will follow-up in 5 months.  Marlan Palau. Keith Jezreel Justiniano MD 04/01/2017 12:31 PM  [facility] [address] [place], [place] [address]  Phone [phone] Fax [phone]

## 2017-04-01 NOTE — Addendum Note (Signed)
Addended by: York SpanielWILLIS, CHARLES K on: 04/01/2017 06:41 PM   Modules accepted: Orders

## 2017-04-05 ENCOUNTER — Telehealth: Payer: Self-pay | Admitting: *Deleted

## 2017-04-05 NOTE — Telephone Encounter (Signed)
Took call from phone staff. Spoke with Donald [name] from [facility]. Verified rx requip 1mg  on file old rx, should be d/c'd. Requip 2mg  at bedtime incorrect, should also be d/c'd. Correct dosage per CW,MD phone note on 04/01/17 is Requip 2mg  1 tablet 3x/day. She verbalized understanding. Took my first and last name per [place] state law. Nothing further needed.

## 2017-04-23 ENCOUNTER — Ambulatory Visit (INDEPENDENT_AMBULATORY_CARE_PROVIDER_SITE_OTHER): Payer: PPO | Admitting: *Deleted

## 2017-04-23 DIAGNOSIS — I359 Nonrheumatic aortic valve disorder, unspecified: Secondary | ICD-10-CM | POA: Diagnosis not present

## 2017-04-23 DIAGNOSIS — I350 Nonrheumatic aortic (valve) stenosis: Secondary | ICD-10-CM | POA: Diagnosis not present

## 2017-04-23 DIAGNOSIS — Z5181 Encounter for therapeutic drug level monitoring: Secondary | ICD-10-CM

## 2017-04-23 LAB — POCT INR: INR: 3.2

## 2017-04-23 NOTE — Patient Instructions (Signed)
Continue taking 1 tablet daily except 1/2 tablet on Sundays and Thursdays   Recheck INR in 6 weeks.  Call [phone] call with any new medications or if scheduled for any other procedures.

## 2017-04-30 ENCOUNTER — Other Ambulatory Visit: Payer: Self-pay | Admitting: Cardiovascular Disease

## 2017-04-30 ENCOUNTER — Other Ambulatory Visit: Payer: Self-pay | Admitting: *Deleted

## 2017-04-30 MED ORDER — WARFARIN SODIUM 5 MG PO TABS: ORAL_TABLET | ORAL | 1 refills | Status: DC

## 2017-05-03 ENCOUNTER — Other Ambulatory Visit: Payer: Self-pay | Admitting: Cardiovascular Disease

## 2017-05-17 ENCOUNTER — Encounter: Payer: Self-pay | Admitting: *Deleted

## 2017-05-30 ENCOUNTER — Other Ambulatory Visit: Payer: Self-pay | Admitting: Internal Medicine

## 2017-05-30 ENCOUNTER — Other Ambulatory Visit: Payer: Self-pay | Admitting: Neurology

## 2017-06-01 ENCOUNTER — Ambulatory Visit (INDEPENDENT_AMBULATORY_CARE_PROVIDER_SITE_OTHER): Payer: PPO

## 2017-06-01 DIAGNOSIS — I350 Nonrheumatic aortic (valve) stenosis: Secondary | ICD-10-CM | POA: Diagnosis not present

## 2017-06-01 DIAGNOSIS — Z5181 Encounter for therapeutic drug level monitoring: Secondary | ICD-10-CM

## 2017-06-01 DIAGNOSIS — I359 Nonrheumatic aortic valve disorder, unspecified: Secondary | ICD-10-CM

## 2017-06-01 LAB — POCT INR: INR: 2.5

## 2017-06-01 NOTE — Patient Instructions (Signed)
Description   Continue taking 1 tablet daily except 1/2 tablet on Sundays and Thursdays. Recheck INR in 6 weeks. Call [phone] call with any new medications or if scheduled for any other procedures.       

## 2017-06-14 ENCOUNTER — Encounter: Payer: Self-pay | Admitting: Internal Medicine

## 2017-06-14 ENCOUNTER — Ambulatory Visit (INDEPENDENT_AMBULATORY_CARE_PROVIDER_SITE_OTHER): Payer: PPO | Admitting: Internal Medicine

## 2017-06-14 VITALS — BP 122/62 | HR 69 | Ht 71.0 in | Wt 192.0 lb

## 2017-06-14 DIAGNOSIS — I10 Essential (primary) hypertension: Secondary | ICD-10-CM

## 2017-06-14 DIAGNOSIS — Z950 Presence of cardiac pacemaker: Secondary | ICD-10-CM

## 2017-06-14 DIAGNOSIS — I359 Nonrheumatic aortic valve disorder, unspecified: Secondary | ICD-10-CM | POA: Diagnosis not present

## 2017-06-14 DIAGNOSIS — I4821 Permanent atrial fibrillation: Secondary | ICD-10-CM

## 2017-06-14 DIAGNOSIS — I495 Sick sinus syndrome: Secondary | ICD-10-CM

## 2017-06-14 DIAGNOSIS — Z952 Presence of prosthetic heart valve: Secondary | ICD-10-CM | POA: Diagnosis not present

## 2017-06-14 DIAGNOSIS — I482 Chronic atrial fibrillation: Secondary | ICD-10-CM

## 2017-06-14 LAB — CUP PACEART INCLINIC DEVICE CHECK
Battery Impedance: 479 Ohm
Brady Statistic RV Percent Paced: 59 %
Date Time Interrogation Session: 20190128112249
Implantable Lead Implant Date: 20060326
Implantable Lead Location: 753859
Implantable Lead Model: 5076
Lead Channel Impedance Value: 486 Ohm
Lead Channel Pacing Threshold Amplitude: 0.5 V
Lead Channel Pacing Threshold Amplitude: 2.5 V
Lead Channel Pacing Threshold Pulse Width: 0.09 ms
Lead Channel Sensing Intrinsic Amplitude: 15.67 mV
Lead Channel Setting Sensing Sensitivity: 5.6 mV
MDC IDC LEAD IMPLANT DT: 20060326
MDC IDC LEAD LOCATION: 753860
MDC IDC MSMT BATTERY REMAINING LONGEVITY: 80 mo
MDC IDC MSMT BATTERY VOLTAGE: 2.77 V
MDC IDC MSMT LEADCHNL RA SENSING INTR AMPL: 1 mV
MDC IDC MSMT LEADCHNL RV IMPEDANCE VALUE: 433 Ohm
MDC IDC MSMT LEADCHNL RV PACING THRESHOLD AMPLITUDE: 0.5 V
MDC IDC MSMT LEADCHNL RV PACING THRESHOLD PULSEWIDTH: 0.4 ms
MDC IDC MSMT LEADCHNL RV PACING THRESHOLD PULSEWIDTH: 0.4 ms
MDC IDC PG IMPLANT DT: 20120817
MDC IDC SET LEADCHNL RV PACING AMPLITUDE: 2.5 V
MDC IDC SET LEADCHNL RV PACING PULSEWIDTH: 0.4 ms

## 2017-06-14 NOTE — Patient Instructions (Signed)
Medication Instructions:  Your physician recommends that you continue on your current medications as directed. Please refer to the Current Medication list given to you today.   Labwork: None ordered   Testing/Procedures: None ordered   Follow-Up: Remote monitoring is used to monitor your Pacemaker  from home. This monitoring reduces the number of office visits required to check your device to one time per year. It allows us to keep an eye on the functioning of your device to ensure it is working properly. You are scheduled for a device check from home on 09/13/17. You may send your transmission at any time that day. If you have a wireless device, the transmission will be sent automatically. After your physician reviews your transmission, you will receive a postcard with your next transmission date.    Your physician wants you to follow-up in: 12 months with Dr Johney FrameAllred.   Please call our office to schedule the follow-up appointment.   Any Other Special Instructions Will Be Listed Below (If Applicable).     If you need a refill on your cardiac medications before your next appointment, please call your pharmacy.

## 2017-06-14 NOTE — Progress Notes (Signed)
PCP: Gaspar Garbeisovec, Richard W, MD Primary Neurologist:  Dr Anne HahnWillis Primary EP:  Dr Johney FrameAllred  Donald [name] Donald [name] is a [age] y.o. male who presents today for routine electrophysiology followup.  Since last being seen in our clinic, the patient reports doing very well.  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  His Parkinson's is advanced but stable. Today, he denies symptoms of palpitations, chest pain, shortness of breath,   dizziness, presyncope, or syncope.  Donald leg swelling is improved.  The patient is otherwise without complaint today.   Past Medical History:  Diagnosis Date  . Abnormality of gait 08/27/2014  . Anxiety   . Aortic stenosis    s/p AVR by Dr Laneta SimmersBartle  . Arthritis    left wrist- probable- not diagonised  . Asthma    in the past  . CAD (coronary artery disease)    s/p CABG 2006  . GERD (gastroesophageal reflux disease)   . H/O seasonal allergies   . H/O: rheumatic fever   . Hearing deficit    Bilateral hearing aids  . Hiatal hernia   . History of kidney stones   . HOH (hard of hearing)   . Hyperlipidemia   . Hypertension   . Hyperthyroidism   . Memory difficulty 08/27/2014  . Pacemaker   . Parkinson's disease   . Persistent atrial fibrillation (HCC)   . Scarlet fever    as a child  . Shortness of breath   . Sick sinus syndrome (HCC)    s/p PPM (MDT) 2006   Past Surgical History:  Procedure Laterality Date  . AORTIC VALVE REPLACEMENT  2006  . CARDIOVERSION N/A 09/14/2013   Procedure: CARDIOVERSION;  Surgeon: Thurmon FairMihai Croitoru, MD;  [facility];  Service: Cardiovascular;  Laterality: N/A;  . CHOLECYSTECTOMY N/A 07/21/2013   Procedure: LAPAROSCOPIC CHOLECYSTECTOMY;  Surgeon: Mariella SaaBenjamin T Hoxworth, MD;  Location: [facility] OR;  Service: General;  Laterality: N/A;  . COLONOSCOPY W/ POLYPECTOMY    . COLONOSCOPY WITH PROPOFOL N/A 08/31/2016   Procedure: COLONOSCOPY WITH PROPOFOL;  Surgeon: Charlott RakesVincent Schooler, MD;  [facility];  Service: Endoscopy;  Laterality: N/A;  .  CORONARY ARTERY BYPASS GRAFT  2006  . DENTAL SURGERY     implants  . INSERT / REPLACE / REMOVE PACEMAKER  2006, 2012   MDT for sick sinus syndrome, gen change 8/12 by JA  . KIDNEY STONE SURGERY  1984    ROS- all systems are reviewed and negative except as per HPI above  Current Outpatient Medications  Medication Sig Dispense Refill  . aspirin 81 MG tablet Take 81 mg by mouth daily.      Marland Kitchen. atorvastatin (LIPITOR) 20 MG tablet Take 1 tablet by mouth daily 90 tablet 0  . carbidopa-levodopa (SINEMET IR) 25-100 MG tablet Take 2 tablets by mouth in the morning & 1 and 1/2 tablets at midday & evening 450 tablet 1  . esomeprazole (NEXIUM) 20 MG capsule Take 20 mg by mouth daily at 12 noon.    Marland Kitchen. levothyroxine (SYNTHROID, LEVOTHROID) 125 MCG tablet Take 125 mcg by mouth daily.      . metoprolol succinate (TOPROL-XL) 25 MG 24 hr tablet Take 1 tablet by mouth once daily 90 tablet 0  . nitroGLYCERIN (NITROSTAT) 0.4 MG SL tablet PLACE 1 TABLET UNDER TONGUE EVERY 5 MINUTES FOR 3 DOSES AS NEEDED FOR CHEST PAIN 25 tablet 3  . rOPINIRole (REQUIP) 2 MG tablet Take 1 tablet (2 mg total) 3 (three) times daily by mouth.  270 tablet 3  . Triamcinolone Acetonide (NASACORT ALLERGY 24HR NA) Place 2 sprays into the nose daily.     . trihexyphenidyl (ARTANE) 2 MG tablet Take 0.5 tablets (1 mg total) by mouth 2 (two) times daily with a meal.    . warfarin (COUMADIN) 5 MG tablet Take as directed by coumadin clinic 90 tablet 1   No current facility-administered medications for this visit.     Physical Exam: Vitals:   06/14/17 1021  BP: 122/62  Pulse: 69  SpO2: 98%  Weight: 192 lb (87.1 kg)  Height: 5\' 11"  (1.803 m)    GEN- The patient is well appearing, alert and oriented x 3 today.   Head- normocephalic, atraumatic Eyes-  Sclera clear, conjunctiva pink Ears- hearing intact Oropharynx- clear Lungs- Clear to ausculation bilaterally, normal work of breathing Chest- pacemaker pocket is well healed Heart-  irregular rate and rhythm, mechanical S2 GI- soft, NT, ND, + BS Extremities- no clubbing, cyanosis, no edema  + stable tremor  Pacemaker interrogation- reviewed in detail today,  See PACEART report   Assessment and Plan:  1. Symptomatic sinus bradycardia  Normal pacemaker function See Pace Art report Reprogrammed VVIR today (see below) V paces 10%,  May need to reproam to VVIR 50 bpm if his V pacing increases  2. Permanent afib Asymptomatic Rate controlled On coumadin Will reprogram PPM VVIR today  3. Aortic stenosis S/p AVR 2006 On coumadin and ASA  4. HTN Stable No change required today  [facility] Return to see me in a year  Hillis Range MD, [facility] 06/14/2017 10:34 AM

## 2017-07-06 DIAGNOSIS — R82998 Other abnormal findings in urine: Secondary | ICD-10-CM | POA: Diagnosis not present

## 2017-07-06 DIAGNOSIS — E7800 Pure hypercholesterolemia, unspecified: Secondary | ICD-10-CM | POA: Diagnosis not present

## 2017-07-06 DIAGNOSIS — Z125 Encounter for screening for malignant neoplasm of prostate: Secondary | ICD-10-CM | POA: Diagnosis not present

## 2017-07-06 DIAGNOSIS — E038 Other specified hypothyroidism: Secondary | ICD-10-CM | POA: Diagnosis not present

## 2017-07-13 ENCOUNTER — Ambulatory Visit (INDEPENDENT_AMBULATORY_CARE_PROVIDER_SITE_OTHER): Payer: PPO | Admitting: Pharmacist

## 2017-07-13 DIAGNOSIS — I350 Nonrheumatic aortic (valve) stenosis: Secondary | ICD-10-CM

## 2017-07-13 DIAGNOSIS — Z5181 Encounter for therapeutic drug level monitoring: Secondary | ICD-10-CM

## 2017-07-13 DIAGNOSIS — D692 Other nonthrombocytopenic purpura: Secondary | ICD-10-CM | POA: Diagnosis not present

## 2017-07-13 DIAGNOSIS — I119 Hypertensive heart disease without heart failure: Secondary | ICD-10-CM | POA: Diagnosis not present

## 2017-07-13 DIAGNOSIS — H9193 Unspecified hearing loss, bilateral: Secondary | ICD-10-CM | POA: Diagnosis not present

## 2017-07-13 DIAGNOSIS — G20 Parkinson's disease: Secondary | ICD-10-CM | POA: Diagnosis not present

## 2017-07-13 DIAGNOSIS — I25810 Atherosclerosis of coronary artery bypass graft(s) without angina pectoris: Secondary | ICD-10-CM | POA: Diagnosis not present

## 2017-07-13 DIAGNOSIS — Z950 Presence of cardiac pacemaker: Secondary | ICD-10-CM | POA: Diagnosis not present

## 2017-07-13 DIAGNOSIS — Z1389 Encounter for screening for other disorder: Secondary | ICD-10-CM | POA: Diagnosis not present

## 2017-07-13 DIAGNOSIS — N401 Enlarged prostate with lower urinary tract symptoms: Secondary | ICD-10-CM | POA: Diagnosis not present

## 2017-07-13 DIAGNOSIS — Z7901 Long term (current) use of anticoagulants: Secondary | ICD-10-CM | POA: Diagnosis not present

## 2017-07-13 DIAGNOSIS — I359 Nonrheumatic aortic valve disorder, unspecified: Secondary | ICD-10-CM | POA: Diagnosis not present

## 2017-07-13 DIAGNOSIS — Z6829 Body mass index (BMI) 29.0-29.9, adult: Secondary | ICD-10-CM | POA: Diagnosis not present

## 2017-07-13 DIAGNOSIS — I482 Chronic atrial fibrillation: Secondary | ICD-10-CM | POA: Diagnosis not present

## 2017-07-13 DIAGNOSIS — Z0000 Encounter for general adult medical examination without abnormal findings: Secondary | ICD-10-CM | POA: Diagnosis not present

## 2017-07-13 LAB — POCT INR: INR: 2.3

## 2017-07-13 NOTE — Patient Instructions (Signed)
Description   Take an extra 1/2 tablet today when you get home, then continue taking 1 tablet daily except 1/2 tablet on Sundays and Thursdays   Recheck INR in 4 weeks.  Call [phone] call with any new medications or if scheduled for any other procedures.

## 2017-08-13 ENCOUNTER — Ambulatory Visit (INDEPENDENT_AMBULATORY_CARE_PROVIDER_SITE_OTHER): Payer: PPO | Admitting: Pharmacist

## 2017-08-13 DIAGNOSIS — Z5181 Encounter for therapeutic drug level monitoring: Secondary | ICD-10-CM | POA: Diagnosis not present

## 2017-08-13 DIAGNOSIS — I350 Nonrheumatic aortic (valve) stenosis: Secondary | ICD-10-CM

## 2017-08-13 DIAGNOSIS — I359 Nonrheumatic aortic valve disorder, unspecified: Secondary | ICD-10-CM

## 2017-08-13 LAB — POCT INR: INR: 2.3

## 2017-08-13 NOTE — Patient Instructions (Signed)
Description   Take 1.5 tablets today, then start taking 1 tablet daily except 1/2 tablet on Sundays.  Recheck INR in 3 weeks.  Call [phone] call with any new medications or if scheduled for any other procedures.

## 2017-09-01 ENCOUNTER — Ambulatory Visit (INDEPENDENT_AMBULATORY_CARE_PROVIDER_SITE_OTHER): Payer: PPO | Admitting: Neurology

## 2017-09-01 ENCOUNTER — Encounter: Payer: Self-pay | Admitting: Neurology

## 2017-09-01 VITALS — BP 118/74 | HR 80 | Ht 71.0 in | Wt 199.0 lb

## 2017-09-01 DIAGNOSIS — G20 Parkinson's disease: Secondary | ICD-10-CM

## 2017-09-01 MED ORDER — CARBIDOPA-LEVODOPA 25-100 MG PO TABS: 2.0000 | ORAL_TABLET | Freq: Three times a day (TID) | ORAL | 1 refills | Status: DC

## 2017-09-01 MED ORDER — TRIHEXYPHENIDYL HCL 2 MG PO TABS: 1.0000 mg | ORAL_TABLET | Freq: Every day | ORAL | Status: DC

## 2017-09-01 NOTE — Progress Notes (Signed)
Reason for visit: Parkinson's disease  Donald [name] is an [age] y.o. male  History of present illness:  Donald [name] is a 77 year old left-handed white male with a history of Parkinson's disease associated with mild gait instability.  The patient has not had any falls, he is shuffling his feet with walking, he has noted some increased stiffness with walking.  He remains on Sinemet taking 2 tablets in the morning and 1.5 tablets at midday and evening.  He is also on Requip 2 mg 3 times daily, he takes Artane 2 mg, 1/2 tablet daily.  The patient has had some mild memory problems but cutting back on the Artane has helped some.  The patient denies any problems with swallowing or choking.  He does exercise on a regular basis, he cuts grass regularly, and he works out at [employer].  The patient is hard of hearing, he has bilateral hearing aids.  He returns to this office for an evaluation.  Past Medical History:  Diagnosis Date  . Abnormality of gait 08/27/2014  . Anxiety   . Aortic stenosis    s/p AVR by Dr Laneta SimmersBartle  . Arthritis    left wrist- probable- not diagonised  . Asthma    in the past  . CAD (coronary artery disease)    s/p CABG 2006  . GERD (gastroesophageal reflux disease)   . H/O seasonal allergies   . H/O: rheumatic fever   . Hearing deficit    Bilateral hearing aids  . Hiatal hernia   . History of kidney stones   . HOH (hard of hearing)   . Hyperlipidemia   . Hypertension   . Hyperthyroidism   . Memory difficulty 08/27/2014  . Pacemaker   . Parkinson's disease   . Persistent atrial fibrillation (HCC)   . Scarlet fever    as a child  . Shortness of breath   . Sick sinus syndrome (HCC)    s/p PPM (MDT) 2006    Past Surgical History:  Procedure Laterality Date  . AORTIC VALVE REPLACEMENT  2006  . CARDIOVERSION N/A 09/14/2013   Procedure: CARDIOVERSION;  Surgeon: Thurmon FairMihai Croitoru, MD;  [facility];  Service: Cardiovascular;  Laterality: N/A;  . CHOLECYSTECTOMY  N/A 07/21/2013   Procedure: LAPAROSCOPIC CHOLECYSTECTOMY;  Surgeon: Mariella SaaBenjamin T Hoxworth, MD;  Location: [facility] OR;  Service: General;  Laterality: N/A;  . COLONOSCOPY W/ POLYPECTOMY    . COLONOSCOPY WITH PROPOFOL N/A 08/31/2016   Procedure: COLONOSCOPY WITH PROPOFOL;  Surgeon: Charlott RakesVincent Schooler, MD;  [facility];  Service: Endoscopy;  Laterality: N/A;  . CORONARY ARTERY BYPASS GRAFT  2006  . DENTAL SURGERY     implants  . INSERT / REPLACE / REMOVE PACEMAKER  2006, 2012   MDT for sick sinus syndrome, gen change 8/12 by JA  . KIDNEY STONE SURGERY  1984    Family History  Problem Relation Age of Onset  . Dementia Mother   . Parkinsonism Mother   . Heart disease Father   . Heart disease Sister   . Heart disease Brother   . Tremor Brother        Unilateral tremor  . Coronary artery disease Unknown        family hx of    Social history:  reports that he has never smoked. He has never used smokeless tobacco. He reports that he does not drink alcohol or use drugs.    Allergies  Allergen Reactions  . Sulfa Antibiotics  unknown    Medications:  Prior to Admission medications   Medication Sig Start Date End Date Taking? Authorizing Provider  aspirin 81 MG tablet Take 81 mg by mouth daily.     Yes [provider]  atorvastatin (LIPITOR) 20 MG tablet Take 1 tablet by mouth daily 05/31/17  Yes Allred, Fayrene Fearing, MD  carbidopa-levodopa (SINEMET IR) 25-100 MG tablet Take 2 tablets by mouth 3 (three) times daily. 09/01/17  Yes York Spaniel, MD  esomeprazole (NEXIUM) 20 MG capsule Take 20 mg by mouth daily at 12 noon.   Yes [provider]  levothyroxine (SYNTHROID, LEVOTHROID) 125 MCG tablet Take 125 mcg by mouth daily.     Yes [provider]  metoprolol succinate (TOPROL-XL) 25 MG 24 hr tablet Take 1 tablet by mouth once daily 05/31/17  Yes Allred, Fayrene Fearing, MD  nitroGLYCERIN (NITROSTAT) 0.4 MG SL tablet PLACE 1 TABLET UNDER TONGUE EVERY 5 MINUTES FOR 3 DOSES  AS NEEDED FOR CHEST PAIN 01/19/17  Yes Rosalio Macadamia, NP  rOPINIRole (REQUIP) 2 MG tablet Take 1 tablet (2 mg total) 3 (three) times daily by mouth. 04/01/17  Yes York Spaniel, MD  Triamcinolone Acetonide (NASACORT ALLERGY 24HR NA) Place 2 sprays into the nose daily.    Yes [provider]  trihexyphenidyl (ARTANE) 2 MG tablet Take 0.5 tablets (1 mg total) by mouth daily. 09/01/17  Yes York Spaniel, MD  UNABLE TO FIND Med Name: Hemp oil   Yes [provider]  warfarin (COUMADIN) 5 MG tablet Take as directed by coumadin clinic 05/03/17  Yes Nahser, Deloris Ping, MD    ROS:  Out of a complete 14 system review of symptoms, the patient complains only of the following symptoms, and all other reviewed systems are negative.  Mild memory problems Tremors  Blood pressure 118/74, pulse 80, height 5\' 11"  (1.803 m), weight 199 lb (90.3 kg).  Physical Exam  General: The patient is alert and cooperative at the time of the examination.  Skin: No significant peripheral edema is noted.   Neurologic Exam  Mental status: The patient is alert and oriented x 3 at the time of the examination. The patient has apparent normal recent and remote memory, with an apparently normal attention span and concentration ability.   Cranial nerves: Facial symmetry is present. Speech is normal, no aphasia or dysarthria is noted. Extraocular movements are full. Visual fields are full.  Masking of the face is seen.  Motor: The patient has good strength in all 4 extremities.  Sensory examination: Soft touch sensation is symmetric on the face, arms, and legs.  Coordination: The patient has good finger-nose-finger and heel-to-shin bilaterally.  A resting tremor is seen on the left upper extremity.  Gait and station: The patient is able to arise from a seated position with some difficulty, once up, he can walk independently, gait is somewhat shuffling, he has decreased arm swing, tremor on the left  arm. Tandem gait is normal. Romberg is negative. No drift is seen.  Reflexes: Deep tendon reflexes are symmetric.   Assessment/Plan:  1.  Parkinson's disease  2.  Gait disorder  3.  Mild memory disorder  The patient is having some increased stiffness, shuffling of the gait.  We will go up on the Sinemet taking 2 tablets 3 times daily.  A prescription was sent in for this.  He will remain on the same dose of Artane and Requip.  He will follow-up in 5 months.  Frederico Hamman  Anne Hahn MD 09/01/2017 8:21 AM  [facility] [address] [place], [place] [address]  Phone [phone] Fax [phone]

## 2017-09-01 NOTE — Patient Instructions (Signed)
  We will go up on the Sinement 25/100 mg tablet taking 2 tablets three times a day.

## 2017-09-02 ENCOUNTER — Telehealth: Payer: Self-pay | Admitting: Neurology

## 2017-09-02 NOTE — Telephone Encounter (Signed)
Lorene DyChristie wanted it added that should RN Kara Meadmma get her voicemail it is okay to leave a full detailed message in addition to RN Kara Meadmma just stating her 1st and last name

## 2017-09-02 NOTE — Telephone Encounter (Signed)
Christie from [facility] is asking if it was intentional that they received an order for carbidopa-levodopa (SINEMET IR) 25-100 MG tablet  for 2 tablets 3 times a day.  Please call Lorene Dyhristie

## 2017-09-02 NOTE — Telephone Encounter (Signed)
Called and LVM for Cadotthristie. Gave my first/last name per request. Advised we saw patient in office yesterday and verified Dr. Anne HahnWillis increased his Sinemet 25-100mg  tab dose to 2 tablets TID. This was a new rx sent in. Any other rx's on file should be cx for Sinemet. Gave [facility] phone number if she has further questions/concerns.

## 2017-09-03 ENCOUNTER — Ambulatory Visit: Payer: PPO

## 2017-09-03 DIAGNOSIS — I359 Nonrheumatic aortic valve disorder, unspecified: Secondary | ICD-10-CM

## 2017-09-03 DIAGNOSIS — I350 Nonrheumatic aortic (valve) stenosis: Secondary | ICD-10-CM | POA: Diagnosis not present

## 2017-09-03 DIAGNOSIS — Z5181 Encounter for therapeutic drug level monitoring: Secondary | ICD-10-CM

## 2017-09-03 LAB — POCT INR: INR: 3.1

## 2017-09-03 NOTE — Patient Instructions (Signed)
Description   Continue on same dosage 1 tablet daily except 1/2 tablet on Sundays.  Recheck INR in 4 weeks.  Call [phone] call with any new medications or if scheduled for any other procedures.

## 2017-09-13 ENCOUNTER — Ambulatory Visit (INDEPENDENT_AMBULATORY_CARE_PROVIDER_SITE_OTHER): Payer: PPO | Admitting: *Deleted

## 2017-09-13 DIAGNOSIS — I495 Sick sinus syndrome: Secondary | ICD-10-CM

## 2017-09-13 NOTE — Progress Notes (Signed)
Remote pacemaker transmission.   

## 2017-09-14 ENCOUNTER — Encounter: Payer: Self-pay | Admitting: Cardiology

## 2017-09-15 LAB — CUP PACEART REMOTE DEVICE CHECK
Battery Impedance: 580 Ohm
Battery Remaining Longevity: 85 mo
Brady Statistic RV Percent Paced: 69 %
Implantable Lead Model: 5076
Implantable Lead Model: 5076
Lead Channel Impedance Value: 387 Ohm
Lead Channel Impedance Value: 67 Ohm
Lead Channel Pacing Threshold Amplitude: 0.5 V
Lead Channel Setting Pacing Amplitude: 2.5 V
Lead Channel Setting Pacing Pulse Width: 0.4 ms
Lead Channel Setting Sensing Sensitivity: 5.6 mV
MDC IDC LEAD IMPLANT DT: 20060326
MDC IDC LEAD IMPLANT DT: 20060326
MDC IDC LEAD LOCATION: 753859
MDC IDC LEAD LOCATION: 753860
MDC IDC MSMT BATTERY VOLTAGE: 2.77 V
MDC IDC MSMT LEADCHNL RV PACING THRESHOLD PULSEWIDTH: 0.4 ms
MDC IDC PG IMPLANT DT: 20120817
MDC IDC SESS DTM: 20190429142131

## 2017-10-01 ENCOUNTER — Ambulatory Visit: Payer: PPO | Admitting: Pharmacist

## 2017-10-01 DIAGNOSIS — Z5181 Encounter for therapeutic drug level monitoring: Secondary | ICD-10-CM

## 2017-10-01 DIAGNOSIS — I350 Nonrheumatic aortic (valve) stenosis: Secondary | ICD-10-CM | POA: Diagnosis not present

## 2017-10-01 DIAGNOSIS — I359 Nonrheumatic aortic valve disorder, unspecified: Secondary | ICD-10-CM

## 2017-10-01 LAB — POCT INR: INR: 2.7

## 2017-10-29 ENCOUNTER — Other Ambulatory Visit: Payer: Self-pay | Admitting: Internal Medicine

## 2017-10-29 ENCOUNTER — Ambulatory Visit (INDEPENDENT_AMBULATORY_CARE_PROVIDER_SITE_OTHER): Payer: PPO | Admitting: *Deleted

## 2017-10-29 DIAGNOSIS — I359 Nonrheumatic aortic valve disorder, unspecified: Secondary | ICD-10-CM | POA: Diagnosis not present

## 2017-10-29 DIAGNOSIS — Z5181 Encounter for therapeutic drug level monitoring: Secondary | ICD-10-CM

## 2017-10-29 DIAGNOSIS — I350 Nonrheumatic aortic (valve) stenosis: Secondary | ICD-10-CM

## 2017-10-29 LAB — POCT INR: INR: 4.3 — AB (ref 2.0–3.0)

## 2017-10-29 NOTE — Patient Instructions (Signed)
Description   Skip today's dose, then Continue on same dosage 1 tablet daily except 1/2 tablet on Sundays.  Recheck INR in 2 weeks.  Call [phone] call with any new medications or if scheduled for any other procedures.

## 2017-11-12 ENCOUNTER — Ambulatory Visit: Payer: PPO | Admitting: *Deleted

## 2017-11-12 DIAGNOSIS — Z5181 Encounter for therapeutic drug level monitoring: Secondary | ICD-10-CM | POA: Diagnosis not present

## 2017-11-12 DIAGNOSIS — I350 Nonrheumatic aortic (valve) stenosis: Secondary | ICD-10-CM

## 2017-11-12 DIAGNOSIS — I359 Nonrheumatic aortic valve disorder, unspecified: Secondary | ICD-10-CM

## 2017-11-12 LAB — POCT INR: INR: 3.9 — AB (ref 2.0–3.0)

## 2017-11-12 NOTE — Patient Instructions (Signed)
Description   Skip today's dose, then Change your dosage to  1 tablet daily except 1/2 tablet on Sundays and Thursdays.   Recheck INR in 2 weeks.  Call [phone] call with any new medications or if scheduled for any other procedures.

## 2017-11-26 ENCOUNTER — Ambulatory Visit: Payer: PPO | Admitting: *Deleted

## 2017-11-26 DIAGNOSIS — I359 Nonrheumatic aortic valve disorder, unspecified: Secondary | ICD-10-CM | POA: Diagnosis not present

## 2017-11-26 DIAGNOSIS — Z5181 Encounter for therapeutic drug level monitoring: Secondary | ICD-10-CM

## 2017-11-26 DIAGNOSIS — I350 Nonrheumatic aortic (valve) stenosis: Secondary | ICD-10-CM

## 2017-11-26 LAB — POCT INR: INR: 3 (ref 2.0–3.0)

## 2017-11-26 NOTE — Patient Instructions (Signed)
Description   Continue same dose of coumadin   1 tablet daily except 1/2 tablet on Sundays and Thursdays.   Recheck INR in 3 weeks.  Call [phone] call with any new medications or if scheduled for any other procedures.

## 2017-12-09 DIAGNOSIS — L723 Sebaceous cyst: Secondary | ICD-10-CM | POA: Diagnosis not present

## 2017-12-13 ENCOUNTER — Ambulatory Visit (INDEPENDENT_AMBULATORY_CARE_PROVIDER_SITE_OTHER): Payer: PPO | Admitting: *Deleted

## 2017-12-13 DIAGNOSIS — I495 Sick sinus syndrome: Secondary | ICD-10-CM

## 2017-12-13 NOTE — Progress Notes (Signed)
Remote pacemaker transmission.   

## 2017-12-14 ENCOUNTER — Encounter: Payer: Self-pay | Admitting: Cardiology

## 2017-12-17 LAB — CUP PACEART REMOTE DEVICE CHECK
Battery Impedance: 631 Ohm
Battery Voltage: 2.78 V
Date Time Interrogation Session: 20190726114424
Implantable Lead Implant Date: 20060326
Implantable Lead Location: 753860
Implantable Lead Model: 5076
Lead Channel Impedance Value: 393 Ohm
Lead Channel Pacing Threshold Pulse Width: 0.4 ms
Lead Channel Setting Pacing Pulse Width: 0.4 ms
Lead Channel Setting Sensing Sensitivity: 5.6 mV
MDC IDC LEAD IMPLANT DT: 20060326
MDC IDC LEAD LOCATION: 753859
MDC IDC MSMT BATTERY REMAINING LONGEVITY: 82 mo
MDC IDC MSMT LEADCHNL RA IMPEDANCE VALUE: 67 Ohm
MDC IDC MSMT LEADCHNL RV PACING THRESHOLD AMPLITUDE: 0.625 V
MDC IDC MSMT LEADCHNL RV SENSING INTR AMPL: 16 mV
MDC IDC PG IMPLANT DT: 20120817
MDC IDC SET LEADCHNL RV PACING AMPLITUDE: 2.5 V
MDC IDC STAT BRADY RV PERCENT PACED: 66 %

## 2017-12-21 ENCOUNTER — Ambulatory Visit: Payer: PPO

## 2017-12-21 DIAGNOSIS — Z5181 Encounter for therapeutic drug level monitoring: Secondary | ICD-10-CM | POA: Diagnosis not present

## 2017-12-21 DIAGNOSIS — I350 Nonrheumatic aortic (valve) stenosis: Secondary | ICD-10-CM | POA: Diagnosis not present

## 2017-12-21 DIAGNOSIS — I359 Nonrheumatic aortic valve disorder, unspecified: Secondary | ICD-10-CM

## 2017-12-21 LAB — POCT INR: INR: 2 (ref 2.0–3.0)

## 2017-12-21 NOTE — Patient Instructions (Signed)
Description   Take 1.5 tablets today, then resume same dosage 1 tablet daily except 1/2 tablet on Sundays and Thursdays.   Recheck INR in 2 weeks.  Call [phone] call with any new medications or if scheduled for any other procedures.

## 2018-01-04 ENCOUNTER — Ambulatory Visit: Payer: PPO

## 2018-01-04 DIAGNOSIS — Z5181 Encounter for therapeutic drug level monitoring: Secondary | ICD-10-CM | POA: Diagnosis not present

## 2018-01-04 DIAGNOSIS — I359 Nonrheumatic aortic valve disorder, unspecified: Secondary | ICD-10-CM | POA: Diagnosis not present

## 2018-01-04 DIAGNOSIS — I350 Nonrheumatic aortic (valve) stenosis: Secondary | ICD-10-CM

## 2018-01-04 LAB — POCT INR: INR: 2.8 (ref 2.0–3.0)

## 2018-01-04 NOTE — Patient Instructions (Signed)
Please continue dosage 1 tablet daily except 1/2 tablet on Sundays and Thursdays.   Recheck INR in 3 weeks.  Call [phone] call with any new medications or if scheduled for any other procedures.

## 2018-02-01 ENCOUNTER — Ambulatory Visit: Payer: PPO

## 2018-02-01 DIAGNOSIS — I350 Nonrheumatic aortic (valve) stenosis: Secondary | ICD-10-CM | POA: Diagnosis not present

## 2018-02-01 DIAGNOSIS — Z5181 Encounter for therapeutic drug level monitoring: Secondary | ICD-10-CM | POA: Diagnosis not present

## 2018-02-01 DIAGNOSIS — I359 Nonrheumatic aortic valve disorder, unspecified: Secondary | ICD-10-CM

## 2018-02-01 LAB — POCT INR: INR: 2.9 (ref 2.0–3.0)

## 2018-02-01 NOTE — Patient Instructions (Signed)
Please continue dosage 1 tablet daily except 1/2 tablet on Sundays and Thursdays.   Recheck INR in 5 weeks.  Call [phone] call with any new medications or if scheduled for any other procedures.

## 2018-02-02 ENCOUNTER — Encounter: Payer: Self-pay | Admitting: Neurology

## 2018-02-02 ENCOUNTER — Ambulatory Visit: Payer: PPO | Admitting: Neurology

## 2018-02-02 VITALS — BP 125/85 | HR 71 | Ht 71.0 in | Wt 188.5 lb

## 2018-02-02 DIAGNOSIS — R413 Other amnesia: Secondary | ICD-10-CM

## 2018-02-02 DIAGNOSIS — G20 Parkinson's disease: Secondary | ICD-10-CM

## 2018-02-02 DIAGNOSIS — R269 Unspecified abnormalities of gait and mobility: Secondary | ICD-10-CM

## 2018-02-02 MED ORDER — CARBIDOPA-LEVODOPA 25-100 MG PO TABS: 2.0000 | ORAL_TABLET | Freq: Three times a day (TID) | ORAL | 1 refills | Status: DC

## 2018-02-02 MED ORDER — ENTACAPONE 200 MG PO TABS: 200.0000 mg | ORAL_TABLET | Freq: Three times a day (TID) | ORAL | 1 refills | Status: DC

## 2018-02-02 MED ORDER — TRIHEXYPHENIDYL HCL 2 MG PO TABS: 1.0000 mg | ORAL_TABLET | Freq: Every day | ORAL | 3 refills | Status: DC

## 2018-02-02 MED ORDER — ROPINIROLE HCL 2 MG PO TABS: 2.0000 mg | ORAL_TABLET | Freq: Three times a day (TID) | ORAL | 3 refills | Status: DC

## 2018-02-02 NOTE — Patient Instructions (Signed)
We will add Comtan 200 mg three times a day to be taken with the Sinemet (carbidopa).

## 2018-02-02 NOTE — Progress Notes (Signed)
Reason for visit: Parkinson's disease  Donald [name] is an 77 y.o. male  History of present illness:  [name] is a 77 year old left-handed white male with a history of Parkinson's disease associated with prominent tremors on all 4 extremities.  The patient also has a jaw tremor.  He is now noticing that his medications are wearing off.  He takes his first dose around 7 AM, and around 11 AM he begins to have increased tremors and more trouble with walking.  The same thing happens after his 12 noon dose, by 4 or 5 PM the symptoms worsen.  The patient has not had any falls, he is trying to stay active.  He denies issues with swallowing or choking.  He claims that he is sleeping fairly well at night.  He does have some mild memory problems, he has had to cut back on his Artane because of this.  The patient remains on the 25/100 mg Sinemet tablets taking 2 tablets 3 times daily.  He tolerates the drug fairly well.  He remains on Requip 2 mg 3 times daily.  Past Medical History:  Diagnosis Date  . Abnormality of gait 08/27/2014  . Anxiety   . Aortic stenosis    s/p AVR by Dr Laneta Simmers  . Arthritis    left wrist- probable- not diagonised  . Asthma    in the past  . CAD (coronary artery disease)    s/p CABG 2006  . GERD (gastroesophageal reflux disease)   . H/O seasonal allergies   . H/O: rheumatic fever   . Hearing deficit    Bilateral hearing aids  . Hiatal hernia   . History of kidney stones   . HOH (hard of hearing)   . Hyperlipidemia   . Hypertension   . Hyperthyroidism   . Memory difficulty 08/27/2014  . Pacemaker   . Parkinson's disease   . Persistent atrial fibrillation (HCC)   . Scarlet fever    as a child  . Shortness of breath   . Sick sinus syndrome (HCC)    s/p PPM (MDT) 2006    Past Surgical History:  Procedure Laterality Date  . AORTIC VALVE REPLACEMENT  2006  . CARDIOVERSION N/A 09/14/2013   Procedure: CARDIOVERSION;  Surgeon: Thurmon Fair, MD;  [facility];  Service: Cardiovascular;  Laterality: N/A;  . CHOLECYSTECTOMY N/A 07/21/2013   Procedure: LAPAROSCOPIC CHOLECYSTECTOMY;  Surgeon: Mariella Saa, MD;  Location: [facility] OR;  Service: General;  Laterality: N/A;  . COLONOSCOPY W/ POLYPECTOMY    . COLONOSCOPY WITH PROPOFOL N/A 08/31/2016   Procedure: COLONOSCOPY WITH PROPOFOL;  Surgeon: Charlott Rakes, MD;  Location: [facility]COPY;  Service: Endoscopy;  Laterality: N/A;  . CORONARY ARTERY BYPASS GRAFT  2006  . DENTAL SURGERY     implants  . INSERT / REPLACE / REMOVE PACEMAKER  2006, 2012   MDT for sick sinus syndrome, gen change 8/12 by JA  . KIDNEY STONE SURGERY  1984    Family History  Problem Relation Age of Onset  . Dementia Mother   . Parkinsonism Mother   . Heart disease Father   . Heart disease Sister   . Heart disease Brother   . Tremor Brother        Unilateral tremor  . Coronary artery disease Unknown        family hx of    Social history:  reports that he has never smoked. He has never used smokeless tobacco. He reports  that he does not drink alcohol or use drugs.    Allergies  Allergen Reactions  . Sulfa Antibiotics     unknown    Medications:  Prior to Admission medications   Medication Sig Start Date End Date Taking? Authorizing Provider  aspirin 81 MG tablet Take 81 mg by mouth daily.     Yes [provider]  atorvastatin (LIPITOR) 20 MG tablet Take 1 tablet by mouth daily 10/29/17  Yes Allred, Fayrene Fearing, MD  carbidopa-levodopa (SINEMET IR) 25-100 MG tablet Take 2 tablets by mouth 3 (three) times daily. 02/02/18  Yes York Spaniel, MD  esomeprazole (NEXIUM) 20 MG capsule Take 20 mg by mouth daily at 12 noon.   Yes [provider]  levothyroxine (SYNTHROID, LEVOTHROID) 125 MCG tablet Take 125 mcg by mouth daily.     Yes [provider]  metoprolol succinate (TOPROL-XL) 25 MG 24 hr tablet Take 1 tablet by mouth once daily 10/29/17  Yes Allred, Fayrene Fearing, MD  nitroGLYCERIN  (NITROSTAT) 0.4 MG SL tablet PLACE 1 TABLET UNDER TONGUE EVERY 5 MINUTES FOR 3 DOSES AS NEEDED FOR CHEST PAIN 01/19/17  Yes Rosalio Macadamia, NP  rOPINIRole (REQUIP) 2 MG tablet Take 1 tablet (2 mg total) by mouth 3 (three) times daily. 02/02/18  Yes York Spaniel, MD  Triamcinolone Acetonide (NASACORT ALLERGY 24HR NA) Place 2 sprays into the nose daily.    Yes [provider]  trihexyphenidyl (ARTANE) 2 MG tablet Take 0.5 tablets (1 mg total) by mouth daily. 02/02/18  Yes York Spaniel, MD  UNABLE TO FIND Med Name: Hemp oil   Yes [provider]  warfarin (COUMADIN) 5 MG tablet Take as directed by coumadin clinic 05/03/17  Yes Nahser, Deloris Ping, MD  entacapone (COMTAN) 200 MG tablet Take 1 tablet (200 mg total) by mouth 3 (three) times daily. 02/02/18   York Spaniel, MD    ROS:  Out of a complete 14 system review of symptoms, the patient complains only of the following symptoms, and all other reviewed systems are negative.  Tremors Walking problems  Blood pressure 125/85, pulse 71, height 5\' 11"  (1.803 m), weight 188 lb 8 oz (85.5 kg).  Physical Exam  General: The patient is alert and cooperative at the time of the examination.  Skin: No significant peripheral edema is noted.   Neurologic Exam  Mental status: The patient is alert and oriented x 3 at the time of the examination. The patient has apparent normal recent and remote memory, with an apparently normal attention span and concentration ability.   Cranial nerves: Facial symmetry is present. Speech is normal, no aphasia or dysarthria is noted. Extraocular movements are full. Visual fields are full.  Masking of the face is seen.  The patient is somewhat hard of hearing.  Motor: The patient has good strength in all 4 extremities.  Sensory examination: Soft touch sensation is symmetric on the face, arms, and legs.  Coordination: The patient has good finger-nose-finger and heel-to-shin bilaterally.   Prominent resting tremor seen with both upper and lower extremities.  Gait and station: The patient has the ability to arise from a seated position with arms crossed with some difficulty.  Once up, the patient walk independently, he has some decreased arm swing of the right arm more than the left, prominent tremors are seen in the arms with walking.  Tandem gait is slightly unsteady. Romberg is negative. No drift is seen.  Reflexes: Deep tendon reflexes are symmetric.  Assessment/Plan:  1.  Parkinson's disease  2.  Mild memory disturbance  The patient is wearing off from his medications about 4 hours after he takes his dose.  The patient will have Comtan added to the medication regimen, prescriptions were given for Comtan, Artane, Sinemet, and Requip.  He will follow-up in 5 months.  Marlan Palau. Keith Willis MD 02/02/2018 11:30 AM  [facility] [address] [place], [place] [address]  Phone [phone] Fax [phone]

## 2018-02-07 DIAGNOSIS — G20 Parkinson's disease: Secondary | ICD-10-CM | POA: Diagnosis not present

## 2018-02-07 DIAGNOSIS — I119 Hypertensive heart disease without heart failure: Secondary | ICD-10-CM | POA: Diagnosis not present

## 2018-02-07 DIAGNOSIS — Z952 Presence of prosthetic heart valve: Secondary | ICD-10-CM | POA: Diagnosis not present

## 2018-02-07 DIAGNOSIS — H900 Conductive hearing loss, bilateral: Secondary | ICD-10-CM | POA: Diagnosis not present

## 2018-02-07 DIAGNOSIS — N390 Urinary tract infection, site not specified: Secondary | ICD-10-CM | POA: Diagnosis not present

## 2018-02-07 DIAGNOSIS — R413 Other amnesia: Secondary | ICD-10-CM | POA: Diagnosis not present

## 2018-02-07 DIAGNOSIS — E038 Other specified hypothyroidism: Secondary | ICD-10-CM | POA: Diagnosis not present

## 2018-02-07 DIAGNOSIS — Z23 Encounter for immunization: Secondary | ICD-10-CM | POA: Diagnosis not present

## 2018-02-07 DIAGNOSIS — Z7901 Long term (current) use of anticoagulants: Secondary | ICD-10-CM | POA: Diagnosis not present

## 2018-02-07 DIAGNOSIS — R3129 Other microscopic hematuria: Secondary | ICD-10-CM | POA: Diagnosis not present

## 2018-02-07 DIAGNOSIS — Z6828 Body mass index (BMI) 28.0-28.9, adult: Secondary | ICD-10-CM | POA: Diagnosis not present

## 2018-02-07 DIAGNOSIS — E7800 Pure hypercholesterolemia, unspecified: Secondary | ICD-10-CM | POA: Diagnosis not present

## 2018-02-07 DIAGNOSIS — I25810 Atherosclerosis of coronary artery bypass graft(s) without angina pectoris: Secondary | ICD-10-CM | POA: Diagnosis not present

## 2018-02-07 DIAGNOSIS — Z950 Presence of cardiac pacemaker: Secondary | ICD-10-CM | POA: Diagnosis not present

## 2018-02-15 ENCOUNTER — Other Ambulatory Visit: Payer: Self-pay | Admitting: *Deleted

## 2018-02-15 MED ORDER — ROPINIROLE HCL 2 MG PO TABS: 2.0000 mg | ORAL_TABLET | Freq: Three times a day (TID) | ORAL | 1 refills | Status: DC

## 2018-02-15 MED ORDER — TRIHEXYPHENIDYL HCL 2 MG PO TABS: 1.0000 mg | ORAL_TABLET | Freq: Every day | ORAL | 1 refills | Status: DC

## 2018-02-15 MED ORDER — ENTACAPONE 200 MG PO TABS: 200.0000 mg | ORAL_TABLET | Freq: Three times a day (TID) | ORAL | 1 refills | Status: DC

## 2018-02-28 ENCOUNTER — Other Ambulatory Visit: Payer: Self-pay | Admitting: Cardiovascular Disease

## 2018-03-08 ENCOUNTER — Ambulatory Visit (INDEPENDENT_AMBULATORY_CARE_PROVIDER_SITE_OTHER): Payer: PPO

## 2018-03-08 DIAGNOSIS — I350 Nonrheumatic aortic (valve) stenosis: Secondary | ICD-10-CM | POA: Diagnosis not present

## 2018-03-08 DIAGNOSIS — I359 Nonrheumatic aortic valve disorder, unspecified: Secondary | ICD-10-CM | POA: Diagnosis not present

## 2018-03-08 DIAGNOSIS — Z5181 Encounter for therapeutic drug level monitoring: Secondary | ICD-10-CM

## 2018-03-08 LAB — POCT INR: INR: 3.6 — AB (ref 2.0–3.0)

## 2018-03-08 NOTE — Patient Instructions (Signed)
Please have a large serving of greens today & tomorrow, continue dosage 1 tablet daily except 1/2 tablet on Sundays and Thursdays.   Recheck INR in 4 weeks.  Call [phone] call with any new medications or if scheduled for any other procedures.

## 2018-03-14 ENCOUNTER — Ambulatory Visit (INDEPENDENT_AMBULATORY_CARE_PROVIDER_SITE_OTHER): Payer: PPO | Admitting: *Deleted

## 2018-03-14 DIAGNOSIS — I495 Sick sinus syndrome: Secondary | ICD-10-CM | POA: Diagnosis not present

## 2018-03-14 DIAGNOSIS — I10 Essential (primary) hypertension: Secondary | ICD-10-CM

## 2018-03-14 NOTE — Progress Notes (Signed)
Remote pacemaker transmission.   

## 2018-03-23 DIAGNOSIS — Z6828 Body mass index (BMI) 28.0-28.9, adult: Secondary | ICD-10-CM | POA: Diagnosis not present

## 2018-03-23 DIAGNOSIS — G20 Parkinson's disease: Secondary | ICD-10-CM | POA: Diagnosis not present

## 2018-03-23 DIAGNOSIS — I482 Chronic atrial fibrillation, unspecified: Secondary | ICD-10-CM | POA: Diagnosis not present

## 2018-03-23 DIAGNOSIS — H6122 Impacted cerumen, left ear: Secondary | ICD-10-CM | POA: Diagnosis not present

## 2018-03-31 ENCOUNTER — Other Ambulatory Visit: Payer: Self-pay | Admitting: Neurology

## 2018-04-05 ENCOUNTER — Ambulatory Visit: Payer: PPO | Admitting: Pharmacist

## 2018-04-05 DIAGNOSIS — Z5181 Encounter for therapeutic drug level monitoring: Secondary | ICD-10-CM | POA: Diagnosis not present

## 2018-04-05 DIAGNOSIS — I350 Nonrheumatic aortic (valve) stenosis: Secondary | ICD-10-CM

## 2018-04-05 DIAGNOSIS — I359 Nonrheumatic aortic valve disorder, unspecified: Secondary | ICD-10-CM | POA: Diagnosis not present

## 2018-04-05 LAB — POCT INR: INR: 4.1 — AB (ref 2.0–3.0)

## 2018-04-05 NOTE — Patient Instructions (Signed)
Description   Skip your Coumadin tomorrow, then continue dosage 1 tablet daily except 1/2 tablet on Sundays and Thursdays.   Recheck INR in 3 weeks.  Call [phone] call with any new medications or if scheduled for any other procedures.

## 2018-04-25 DIAGNOSIS — H2513 Age-related nuclear cataract, bilateral: Secondary | ICD-10-CM | POA: Diagnosis not present

## 2018-04-26 ENCOUNTER — Ambulatory Visit: Payer: PPO | Admitting: *Deleted

## 2018-04-26 DIAGNOSIS — I350 Nonrheumatic aortic (valve) stenosis: Secondary | ICD-10-CM | POA: Diagnosis not present

## 2018-04-26 DIAGNOSIS — I359 Nonrheumatic aortic valve disorder, unspecified: Secondary | ICD-10-CM

## 2018-04-26 DIAGNOSIS — Z5181 Encounter for therapeutic drug level monitoring: Secondary | ICD-10-CM

## 2018-04-26 LAB — POCT INR: INR: 4 — AB (ref 2.0–3.0)

## 2018-04-26 NOTE — Patient Instructions (Signed)
Description   Skip your Coumadin today then start taking 1 tablet daily except 1/2 tablet on Sundays, Tuesdays and Thursdays.   Recheck INR in 2 weeks.  Call [phone] call with any new medications or if scheduled for any other procedures.

## 2018-05-02 ENCOUNTER — Other Ambulatory Visit: Payer: Self-pay | Admitting: Nurse Practitioner

## 2018-05-09 LAB — CUP PACEART REMOTE DEVICE CHECK
Battery Impedance: 707 Ohm
Battery Remaining Longevity: 78 mo
Battery Voltage: 2.77 V
Brady Statistic RV Percent Paced: 66 %
Date Time Interrogation Session: 20191028131250
Implantable Lead Implant Date: 20060326
Implantable Lead Implant Date: 20060326
Implantable Lead Location: 753859
Implantable Lead Location: 753860
Implantable Lead Model: 5076
Implantable Lead Model: 5076
Implantable Pulse Generator Implant Date: 20120817
Lead Channel Impedance Value: 67 Ohm
Lead Channel Pacing Threshold Amplitude: 0.625 V
Lead Channel Pacing Threshold Pulse Width: 0.4 ms
Lead Channel Setting Pacing Amplitude: 2.5 V
Lead Channel Setting Pacing Pulse Width: 0.4 ms
Lead Channel Setting Sensing Sensitivity: 5.6 mV
MDC IDC MSMT LEADCHNL RV IMPEDANCE VALUE: 410 Ohm

## 2018-05-10 ENCOUNTER — Ambulatory Visit: Payer: PPO

## 2018-05-10 DIAGNOSIS — Z5181 Encounter for therapeutic drug level monitoring: Secondary | ICD-10-CM | POA: Diagnosis not present

## 2018-05-10 DIAGNOSIS — I350 Nonrheumatic aortic (valve) stenosis: Secondary | ICD-10-CM | POA: Diagnosis not present

## 2018-05-10 DIAGNOSIS — I359 Nonrheumatic aortic valve disorder, unspecified: Secondary | ICD-10-CM | POA: Diagnosis not present

## 2018-05-10 LAB — POCT INR: INR: 2.6 (ref 2.0–3.0)

## 2018-05-10 NOTE — Patient Instructions (Signed)
Description   Continue on same dosage 1 tablet daily except 1/2 tablet on Sundays, Tuesdays and Thursdays.   Recheck INR in 3 weeks.  Call [phone] call with any new medications or if scheduled for any other procedures.

## 2018-05-20 DIAGNOSIS — H25013 Cortical age-related cataract, bilateral: Secondary | ICD-10-CM | POA: Diagnosis not present

## 2018-05-20 DIAGNOSIS — H2513 Age-related nuclear cataract, bilateral: Secondary | ICD-10-CM | POA: Diagnosis not present

## 2018-05-20 DIAGNOSIS — H5203 Hypermetropia, bilateral: Secondary | ICD-10-CM | POA: Diagnosis not present

## 2018-05-24 ENCOUNTER — Other Ambulatory Visit: Payer: Self-pay | Admitting: Internal Medicine

## 2018-05-31 ENCOUNTER — Ambulatory Visit: Payer: PPO | Admitting: *Deleted

## 2018-05-31 DIAGNOSIS — I359 Nonrheumatic aortic valve disorder, unspecified: Secondary | ICD-10-CM | POA: Diagnosis not present

## 2018-05-31 DIAGNOSIS — I350 Nonrheumatic aortic (valve) stenosis: Secondary | ICD-10-CM | POA: Diagnosis not present

## 2018-05-31 DIAGNOSIS — Z5181 Encounter for therapeutic drug level monitoring: Secondary | ICD-10-CM | POA: Diagnosis not present

## 2018-05-31 LAB — POCT INR: INR: 2.7 (ref 2.0–3.0)

## 2018-05-31 NOTE — Patient Instructions (Signed)
Description   Continue on same dosage 1 tablet daily except 1/2 tablet on Sundays, Tuesdays and Thursdays. Recheck INR in 4 weeks. Call [phone] call with any new medications or if scheduled for any other procedures.      

## 2018-06-02 ENCOUNTER — Encounter: Payer: Self-pay | Admitting: Internal Medicine

## 2018-06-07 DIAGNOSIS — H25011 Cortical age-related cataract, right eye: Secondary | ICD-10-CM | POA: Diagnosis not present

## 2018-06-07 DIAGNOSIS — H2511 Age-related nuclear cataract, right eye: Secondary | ICD-10-CM | POA: Diagnosis not present

## 2018-06-07 DIAGNOSIS — H25811 Combined forms of age-related cataract, right eye: Secondary | ICD-10-CM | POA: Diagnosis not present

## 2018-06-09 ENCOUNTER — Other Ambulatory Visit: Payer: Self-pay | Admitting: Internal Medicine

## 2018-06-13 ENCOUNTER — Ambulatory Visit (INDEPENDENT_AMBULATORY_CARE_PROVIDER_SITE_OTHER): Payer: PPO

## 2018-06-13 DIAGNOSIS — I495 Sick sinus syndrome: Secondary | ICD-10-CM | POA: Diagnosis not present

## 2018-06-14 NOTE — Progress Notes (Signed)
Remote pacemaker transmission.   

## 2018-06-15 ENCOUNTER — Encounter: Payer: Self-pay | Admitting: Internal Medicine

## 2018-06-15 ENCOUNTER — Ambulatory Visit: Payer: PPO | Admitting: Internal Medicine

## 2018-06-15 VITALS — BP 122/78 | HR 78 | Ht 71.0 in | Wt 186.0 lb

## 2018-06-15 DIAGNOSIS — I495 Sick sinus syndrome: Secondary | ICD-10-CM

## 2018-06-15 DIAGNOSIS — I10 Essential (primary) hypertension: Secondary | ICD-10-CM | POA: Diagnosis not present

## 2018-06-15 DIAGNOSIS — Z950 Presence of cardiac pacemaker: Secondary | ICD-10-CM | POA: Diagnosis not present

## 2018-06-15 DIAGNOSIS — I4821 Permanent atrial fibrillation: Secondary | ICD-10-CM

## 2018-06-15 DIAGNOSIS — I350 Nonrheumatic aortic (valve) stenosis: Secondary | ICD-10-CM

## 2018-06-15 NOTE — Patient Instructions (Signed)
Medication Instructions:  Your physician recommends that you continue on your current medications as directed. Please refer to the Current Medication list given to you today.  Labwork: None ordered.  Testing/Procedures: None ordered.  Follow-Up: Your physician wants you to follow-up in: one year with Dr. Allred.   You will receive a reminder letter in the mail two months in advance. If you don't receive a letter, please call our office to schedule the follow-up appointment.  Remote monitoring is used to monitor your Pacemaker from home. This monitoring reduces the number of office visits required to check your device to one time per year. It allows us to keep an eye on the functioning of your device to ensure it is working properly. You are scheduled for a device check from home on 09/12/2018. You may send your transmission at any time that day. If you have a wireless device, the transmission will be sent automatically. After your physician reviews your transmission, you will receive a postcard with your next transmission date.  Any Other Special Instructions Will Be Listed Below (If Applicable).  If you need a refill on your cardiac medications before your next appointment, please call your pharmacy.   

## 2018-06-15 NOTE — Progress Notes (Signed)
PCP: Gaspar Garbe, MD   Primary EP:  Donald [name] is a 78 y.o. male who presents today for routine electrophysiology followup.  Since last being seen in our clinic, the patient reports doing very well.  Today, he denies symptoms of palpitations, chest pain, shortness of breath,  lower extremity edema, dizziness, presyncope, or syncope.  The patient is otherwise without complaint today.   Past Medical History:  Diagnosis Date  . Abnormality of gait 08/27/2014  . Anxiety   . Aortic stenosis    s/p AVR by Donald Laneta Simmers  . Arthritis    left wrist- probable- not diagonised  . Asthma    in the past  . CAD (coronary artery disease)    s/p CABG 2006  . GERD (gastroesophageal reflux disease)   . H/O seasonal allergies   . H/O: rheumatic fever   . Hearing deficit    Bilateral hearing aids  . Hiatal hernia   . History of kidney stones   . HOH (hard of hearing)   . Hyperlipidemia   . Hypertension   . Hyperthyroidism   . Memory difficulty 08/27/2014  . Pacemaker   . Parkinson's disease   . Persistent atrial fibrillation   . Scarlet fever    as a child  . Shortness of breath   . Sick sinus syndrome (HCC)    s/p PPM (MDT) 2006   Past Surgical History:  Procedure Laterality Date  . AORTIC VALVE REPLACEMENT  2006  . CARDIOVERSION N/A 09/14/2013   Procedure: CARDIOVERSION;  Surgeon: Thurmon Fair, MD;  Location: [facility] END[facility]COPY;  Service: Cardiovascular;  Laterality: N/A;  . CHOLECYSTECTOMY N/A 07/21/2013   Procedure: LAPAROSCOPIC CHOLECYSTECTOMY;  Surgeon: Mariella Saa, MD;  Location: [facility] OR;  Service: General;  Laterality: N/A;  . COLONOSCOPY W/ POLYPECTOMY    . COLONOSCOPY WITH PROPOFOL N/A 08/31/2016   Procedure: COLONOSCOPY WITH PROPOFOL;  Surgeon: Charlott Rakes, MD;  Location: [facility] ENDOSCOPY;  Service: Endoscopy;  Laterality: N/A;  . CORONARY ARTERY BYPASS GRAFT  2006  . DENTAL SURGERY     implants  . INSERT / REPLACE / REMOVE PACEMAKER  2006, 2012   MDT  for sick sinus syndrome, gen change 8/12 by JA  . KIDNEY STONE SURGERY  1984    ROS- all systems are reviewed and negative except as per HPI above  Current Outpatient Medications  Medication Sig Dispense Refill  . aspirin 81 MG tablet Take 81 mg by mouth daily.      Marland Kitchen atorvastatin (LIPITOR) 20 MG tablet Take 1 tablet by mouth daily 90 tablet 2  . carbidopa-levodopa (SINEMET IR) 25-100 MG tablet Take 2 tablets by mouth 3 times a day 540 tablet 1  . entacapone (COMTAN) 200 MG tablet Take 1 tablet (200 mg total) by mouth 3 (three) times daily. 270 tablet 1  . esomeprazole (NEXIUM) 20 MG capsule Take 20 mg by mouth daily at 12 noon.    Marland Kitchen levothyroxine (SYNTHROID, LEVOTHROID) 125 MCG tablet Take 125 mcg by mouth daily.      . metoprolol succinate (TOPROL-XL) 25 MG 24 hr tablet Take 1 tablet (25 mg total) by mouth daily. Please keep upcoming appt for future refills. Thank you 90 tablet 0  . nitroGLYCERIN (NITROSTAT) 0.4 MG SL tablet PLACE 1 TABLET UNDER TONGUE EVERY 5 MINUTES FOR 3 DOSES AS NEEDED FOR CHEST PAIN 25 tablet 3  . rOPINIRole (REQUIP) 2 MG tablet Take 1 tablet (2 mg total) by mouth 3 (  three) times daily. 270 tablet 1  . Triamcinolone Acetonide (NASACORT ALLERGY 24HR NA) Place 2 sprays into the nose daily.     . trihexyphenidyl (ARTANE) 2 MG tablet Take 0.5 tablets (1 mg total) by mouth daily. 45 tablet 1  . UNABLE TO FIND Med Name: Hemp oil    . warfarin (COUMADIN) 5 MG tablet Take as directed by coumadin clinic 90 tablet 1   No current facility-administered medications for this visit.     Physical Exam: Vitals:   06/15/18 1505  BP: 122/78  Pulse: 78  SpO2: 96%  Weight: 186 lb (84.4 kg)  Height: 5\' 11"  (1.803 m)    GEN- The patient is well appearing, alert and oriented x 3 today.   Head- normocephalic, atraumatic, wearing a [employer] hat Eyes-  Sclera clear, conjunctiva pink Ears- hearing intact Oropharynx- clear Lungs- Clear to ausculation bilaterally, normal work of  breathing Chest- pacemaker pocket is well healed Heart- Regular rate and rhythm (V paced) GI- soft, NT, ND, + BS Extremities- no clubbing, cyanosis, or edema Stable tremor  Pacemaker interrogation- reviewed in detail today,  See PACEART report  ekg tracing ordered today is personally reviewed and shows afib with V pacing  Assessment and Plan:  1. Symptomatic bradycardia  Normal pacemaker function See Pace Art report Reduce lower rate to 50 bpm to minimize V pacing (currently V pacing 66%)  2. Permanent afib Rate controlled Asymptomatic On coumadin  3. HTN Stable No change required today  4. S/p AVR 2006 On coumadin and ASA  [facility] Return in a year  Donald RangeJames Leonard Feigel MD, [facility] Systems[facility]ACC 06/15/2018 3:12 PM

## 2018-06-16 LAB — CUP PACEART REMOTE DEVICE CHECK
Battery Impedance: 784 Ohm
Battery Remaining Longevity: 74 mo
Brady Statistic RV Percent Paced: 66 %
Date Time Interrogation Session: 20200127133933
Implantable Lead Implant Date: 20060326
Implantable Lead Implant Date: 20060326
Implantable Lead Location: 753860
Implantable Lead Model: 5076
Implantable Lead Model: 5076
Lead Channel Impedance Value: 419 Ohm
Lead Channel Impedance Value: 67 Ohm
Lead Channel Pacing Threshold Pulse Width: 0.4 ms
Lead Channel Setting Pacing Amplitude: 2.5 V
Lead Channel Setting Pacing Pulse Width: 0.4 ms
Lead Channel Setting Sensing Sensitivity: 5.6 mV
MDC IDC LEAD LOCATION: 753859
MDC IDC MSMT BATTERY VOLTAGE: 2.77 V
MDC IDC MSMT LEADCHNL RV PACING THRESHOLD AMPLITUDE: 0.625 V
MDC IDC PG IMPLANT DT: 20120817

## 2018-06-16 LAB — CUP PACEART INCLINIC DEVICE CHECK
Battery Impedance: 732 Ohm
Battery Remaining Longevity: 76 mo
Battery Voltage: 2.77 V
Brady Statistic RV Percent Paced: 66 %
Date Time Interrogation Session: 20200129202000
Implantable Lead Implant Date: 20060326
Implantable Lead Implant Date: 20060326
Implantable Lead Location: 753859
Implantable Lead Location: 753860
Implantable Lead Model: 5076
Implantable Lead Model: 5076
Implantable Pulse Generator Implant Date: 20120817
Lead Channel Impedance Value: 395 Ohm
Lead Channel Impedance Value: 67 Ohm
Lead Channel Pacing Threshold Amplitude: 0.625 V
Lead Channel Pacing Threshold Pulse Width: 0.4 ms
Lead Channel Setting Pacing Amplitude: 2.5 V
Lead Channel Setting Pacing Pulse Width: 0.4 ms
Lead Channel Setting Sensing Sensitivity: 5.6 mV

## 2018-06-24 ENCOUNTER — Ambulatory Visit (INDEPENDENT_AMBULATORY_CARE_PROVIDER_SITE_OTHER): Payer: PPO | Admitting: *Deleted

## 2018-06-24 DIAGNOSIS — I359 Nonrheumatic aortic valve disorder, unspecified: Secondary | ICD-10-CM

## 2018-06-24 DIAGNOSIS — I350 Nonrheumatic aortic (valve) stenosis: Secondary | ICD-10-CM | POA: Diagnosis not present

## 2018-06-24 DIAGNOSIS — Z5181 Encounter for therapeutic drug level monitoring: Secondary | ICD-10-CM

## 2018-06-24 LAB — POCT INR: INR: 2.9 (ref 2.0–3.0)

## 2018-06-24 NOTE — Patient Instructions (Signed)
Description   Continue on same dosage 1 tablet daily except 1/2 tablet on Sundays, Tuesdays and Thursdays. Recheck INR in 6 weeks. Call [phone] call with any new medications or if scheduled for any other procedures.      

## 2018-07-13 DIAGNOSIS — E038 Other specified hypothyroidism: Secondary | ICD-10-CM | POA: Diagnosis not present

## 2018-07-13 DIAGNOSIS — Z125 Encounter for screening for malignant neoplasm of prostate: Secondary | ICD-10-CM | POA: Diagnosis not present

## 2018-07-13 DIAGNOSIS — E7800 Pure hypercholesterolemia, unspecified: Secondary | ICD-10-CM | POA: Diagnosis not present

## 2018-07-13 DIAGNOSIS — I119 Hypertensive heart disease without heart failure: Secondary | ICD-10-CM | POA: Diagnosis not present

## 2018-07-13 DIAGNOSIS — R82998 Other abnormal findings in urine: Secondary | ICD-10-CM | POA: Diagnosis not present

## 2018-07-18 DIAGNOSIS — Z1212 Encounter for screening for malignant neoplasm of rectum: Secondary | ICD-10-CM | POA: Diagnosis not present

## 2018-07-19 ENCOUNTER — Ambulatory Visit: Payer: PPO | Admitting: Neurology

## 2018-07-19 DIAGNOSIS — H2512 Age-related nuclear cataract, left eye: Secondary | ICD-10-CM | POA: Diagnosis not present

## 2018-07-19 DIAGNOSIS — H25012 Cortical age-related cataract, left eye: Secondary | ICD-10-CM | POA: Diagnosis not present

## 2018-07-19 DIAGNOSIS — H25812 Combined forms of age-related cataract, left eye: Secondary | ICD-10-CM | POA: Diagnosis not present

## 2018-07-20 DIAGNOSIS — Z952 Presence of prosthetic heart valve: Secondary | ICD-10-CM | POA: Diagnosis not present

## 2018-07-20 DIAGNOSIS — G20 Parkinson's disease: Secondary | ICD-10-CM | POA: Diagnosis not present

## 2018-07-20 DIAGNOSIS — I4821 Permanent atrial fibrillation: Secondary | ICD-10-CM | POA: Diagnosis not present

## 2018-07-20 DIAGNOSIS — Z7901 Long term (current) use of anticoagulants: Secondary | ICD-10-CM | POA: Diagnosis not present

## 2018-07-20 DIAGNOSIS — Z0000 Encounter for general adult medical examination without abnormal findings: Secondary | ICD-10-CM | POA: Diagnosis not present

## 2018-07-20 DIAGNOSIS — E7800 Pure hypercholesterolemia, unspecified: Secondary | ICD-10-CM | POA: Diagnosis not present

## 2018-07-20 DIAGNOSIS — E038 Other specified hypothyroidism: Secondary | ICD-10-CM | POA: Diagnosis not present

## 2018-07-20 DIAGNOSIS — Z1331 Encounter for screening for depression: Secondary | ICD-10-CM | POA: Diagnosis not present

## 2018-07-20 DIAGNOSIS — I25810 Atherosclerosis of coronary artery bypass graft(s) without angina pectoris: Secondary | ICD-10-CM | POA: Diagnosis not present

## 2018-07-20 DIAGNOSIS — R413 Other amnesia: Secondary | ICD-10-CM | POA: Diagnosis not present

## 2018-07-20 DIAGNOSIS — K5730 Diverticulosis of large intestine without perforation or abscess without bleeding: Secondary | ICD-10-CM | POA: Diagnosis not present

## 2018-07-20 DIAGNOSIS — I119 Hypertensive heart disease without heart failure: Secondary | ICD-10-CM | POA: Diagnosis not present

## 2018-08-04 ENCOUNTER — Other Ambulatory Visit: Payer: Self-pay | Admitting: Internal Medicine

## 2018-08-08 ENCOUNTER — Telehealth: Payer: Self-pay

## 2018-08-08 NOTE — Telephone Encounter (Signed)

## 2018-08-09 ENCOUNTER — Other Ambulatory Visit: Payer: Self-pay

## 2018-08-09 ENCOUNTER — Ambulatory Visit (INDEPENDENT_AMBULATORY_CARE_PROVIDER_SITE_OTHER): Payer: PPO | Admitting: Pharmacist

## 2018-08-09 DIAGNOSIS — I359 Nonrheumatic aortic valve disorder, unspecified: Secondary | ICD-10-CM

## 2018-08-09 DIAGNOSIS — Z5181 Encounter for therapeutic drug level monitoring: Secondary | ICD-10-CM | POA: Diagnosis not present

## 2018-08-09 DIAGNOSIS — I350 Nonrheumatic aortic (valve) stenosis: Secondary | ICD-10-CM | POA: Diagnosis not present

## 2018-08-09 LAB — POCT INR: INR: 3.6 — AB (ref 2.0–3.0)

## 2018-08-29 ENCOUNTER — Other Ambulatory Visit: Payer: Self-pay | Admitting: Internal Medicine

## 2018-08-29 ENCOUNTER — Other Ambulatory Visit: Payer: Self-pay | Admitting: Neurology

## 2018-08-29 ENCOUNTER — Other Ambulatory Visit: Payer: Self-pay | Admitting: Cardiovascular Disease

## 2018-09-12 ENCOUNTER — Other Ambulatory Visit: Payer: Self-pay

## 2018-09-12 ENCOUNTER — Ambulatory Visit (INDEPENDENT_AMBULATORY_CARE_PROVIDER_SITE_OTHER): Payer: PPO | Admitting: *Deleted

## 2018-09-12 DIAGNOSIS — I495 Sick sinus syndrome: Secondary | ICD-10-CM

## 2018-09-12 DIAGNOSIS — I4821 Permanent atrial fibrillation: Secondary | ICD-10-CM

## 2018-09-13 LAB — CUP PACEART REMOTE DEVICE CHECK
Battery Impedance: 887 Ohm
Battery Remaining Longevity: 74 mo
Battery Voltage: 2.77 V
Brady Statistic RV Percent Paced: 46 %
Date Time Interrogation Session: 20200428084415
Implantable Lead Implant Date: 20060326
Implantable Lead Implant Date: 20060326
Implantable Lead Location: 753859
Implantable Lead Location: 753860
Implantable Lead Model: 5076
Implantable Lead Model: 5076
Implantable Pulse Generator Implant Date: 20120817
Lead Channel Impedance Value: 440 Ohm
Lead Channel Impedance Value: 67 Ohm
Lead Channel Pacing Threshold Amplitude: 0.5 V
Lead Channel Pacing Threshold Pulse Width: 0.4 ms
Lead Channel Setting Pacing Amplitude: 2.5 V
Lead Channel Setting Pacing Pulse Width: 0.4 ms
Lead Channel Setting Sensing Sensitivity: 5.6 mV

## 2018-09-20 NOTE — Progress Notes (Signed)
Remote pacemaker transmission.   

## 2018-09-30 ENCOUNTER — Telehealth: Payer: Self-pay

## 2018-09-30 ENCOUNTER — Telehealth: Payer: Self-pay | Admitting: Pharmacist

## 2018-09-30 NOTE — Telephone Encounter (Signed)

## 2018-09-30 NOTE — Telephone Encounter (Signed)
LMOM FOR PRESCREEN  

## 2018-10-03 ENCOUNTER — Ambulatory Visit (INDEPENDENT_AMBULATORY_CARE_PROVIDER_SITE_OTHER): Payer: PPO | Admitting: Pharmacist

## 2018-10-03 ENCOUNTER — Other Ambulatory Visit: Payer: Self-pay

## 2018-10-03 DIAGNOSIS — Z5181 Encounter for therapeutic drug level monitoring: Secondary | ICD-10-CM | POA: Diagnosis not present

## 2018-10-03 DIAGNOSIS — I359 Nonrheumatic aortic valve disorder, unspecified: Secondary | ICD-10-CM

## 2018-10-03 DIAGNOSIS — I350 Nonrheumatic aortic (valve) stenosis: Secondary | ICD-10-CM | POA: Diagnosis not present

## 2018-10-03 LAB — POCT INR: INR: 3.3 — AB (ref 2.0–3.0)

## 2018-10-19 ENCOUNTER — Other Ambulatory Visit: Payer: Self-pay | Admitting: Cardiovascular Disease

## 2018-10-31 ENCOUNTER — Other Ambulatory Visit: Payer: Self-pay | Admitting: Pharmacist

## 2018-10-31 ENCOUNTER — Other Ambulatory Visit: Payer: Self-pay | Admitting: Internal Medicine

## 2018-10-31 MED ORDER — WARFARIN SODIUM 5 MG PO TABS: ORAL_TABLET | ORAL | 0 refills | Status: DC

## 2018-10-31 NOTE — Telephone Encounter (Signed)
New message:    Donald [name] from mail in pharmacy is calling to get a clarification on Warfarin 5 mg. Please call Donald [name]. Also if she do not pick she states a message  can be left on vm. She need to know the instructions on how he should be taken this medication.

## 2018-10-31 NOTE — Telephone Encounter (Signed)
New rx sent in with clarifying instructions.

## 2018-11-22 ENCOUNTER — Telehealth: Payer: Self-pay

## 2018-11-22 NOTE — Telephone Encounter (Signed)
lmom for prescreen  

## 2018-11-25 ENCOUNTER — Telehealth: Payer: Self-pay | Admitting: *Deleted

## 2018-11-25 ENCOUNTER — Telehealth (INDEPENDENT_AMBULATORY_CARE_PROVIDER_SITE_OTHER): Payer: PPO | Admitting: Neurology

## 2018-11-25 ENCOUNTER — Encounter: Payer: Self-pay | Admitting: Neurology

## 2018-11-25 DIAGNOSIS — R413 Other amnesia: Secondary | ICD-10-CM

## 2018-11-25 DIAGNOSIS — R269 Unspecified abnormalities of gait and mobility: Secondary | ICD-10-CM

## 2018-11-25 DIAGNOSIS — R1312 Dysphagia, oropharyngeal phase: Secondary | ICD-10-CM

## 2018-11-25 DIAGNOSIS — G20 Parkinson's disease: Secondary | ICD-10-CM

## 2018-11-25 MED ORDER — CARBIDOPA-LEVODOPA 25-250 MG PO TABS: 1.0000 | ORAL_TABLET | Freq: Three times a day (TID) | ORAL | 2 refills | Status: DC

## 2018-11-25 MED ORDER — ROPINIROLE HCL 2 MG PO TABS: 2.0000 mg | ORAL_TABLET | Freq: Three times a day (TID) | ORAL | 3 refills | Status: DC

## 2018-11-25 MED ORDER — ENTACAPONE 200 MG PO TABS: 200.0000 mg | ORAL_TABLET | Freq: Three times a day (TID) | ORAL | 3 refills | Status: DC

## 2018-11-25 MED ORDER — TRIHEXYPHENIDYL HCL 2 MG PO TABS: 1.0000 mg | ORAL_TABLET | Freq: Every day | ORAL | 3 refills | Status: DC

## 2018-11-25 NOTE — Progress Notes (Signed)
Virtual Visit via Video Note  I connected with Donald [name] on 11/25/18 at  9:00 AM EDT by a video enabled telemedicine application and verified that I am speaking with the correct person using two identifiers.  Location: Patient: The patient is at home. Provider: Physician at home.   I discussed the limitations of evaluation and management by telemedicine and the availability of in person appointments. The patient expressed understanding and agreed to proceed.  History of Present Illness: Donald [name] is a 78 year old left-handed white male with a history of Parkinson's disease.  The patient has tremors involving all 4 extremities, he has a gait disorder associated with the Parkinson's.  The patient is trying to remain active, he walks about a mile and a quarter daily, in the past he has walked 3 miles a day.  He has not been seen since September 2019.  The patient was increased on the Sinemet at that time taking the 25/200 mg tablets, 2 tablets 3 times daily and Comtan was added to the regimen.  He remains on Requip.  The patient generally functions better in the morning, later in the afternoon he does less well.  He does not usually nap in the afternoon but if he sits down and is inactive, he may fall asleep.  The patient has not had any falls, but he is having more issues with try to get up out of a chair, occasionally he might freeze when he tries to walk.  The patient has not fallen.  The patient does not use a cane or a walker for ambulation.  He is starting to have problems with drooling, his family has noted that almost every time that he eats he has problems with choking, he seemed to have more troubles with solids greater than liquids.  The patient is hard of hearing.  There have been no issues with confusion or hallucinations on the medication.   Observations/Objective: On the video evaluation the patient is alert and cooperative.  He is hard of hearing.  Speech is well  enunciated, not aphasic or dysarthric.  He is drooling.  Prominent masking of the face is seen.  Extraocular movements are relatively full.  He has a symmetric face, he is able to protrude the tongue in the midline with good lateral movement of the tongue.  He has good finger-nose-finger and heel shin bilaterally.  He does have some difficulty arising from a seated position, he has to push off with his arms.  Once up, he is able to ambulate independently but has a shuffling gait and decreased arm swing bilaterally.  He is able to perform tandem gait but has slight instability.  Romberg is negative.  Assessment and Plan: 1.  Parkinson's disease  2.  Reported dysphagia  3.  Gait disorder  4.  Tremor  5.  Memory disorder  The patient is having some problems with functioning in the afternoon, the Sinemet dose will be increased to the 25/250 mg tablets taking 1 tablet 3 times a day with the Comtan, we will continue the Requip at the current dose.  We may consider physical therapy for his walking in the future but he is doing a good job on his own by staying active.  Given the recent reports of dysphagia, I will set up a swallowing evaluation.  He will follow-up in 4 months.  The patient does have a mild memory issue, this will need to be followed.  The patient is on  Artane for tremor, this may need to be discontinued.  Prescriptions for Sinemet, Requip, Artane, and Comtan were sent in.  Follow Up Instructions: 30-month follow-up with me.   I discussed the assessment and treatment plan with the patient. The patient was provided an opportunity to ask questions and all were answered. The patient agreed with the plan and demonstrated an understanding of the instructions.   The patient was advised to call back or seek an in-person evaluation if the symptoms worsen or if the condition fails to improve as anticipated.  I provided 25 minutes of non-face-to-face time during this encounter.   Kathrynn Ducking, MD

## 2018-11-25 NOTE — Telephone Encounter (Signed)
1. COVID-19 Pre-Screening Questions:  . In the past 7 to 10 days have you had a cough,  shortness of breath, headache, congestion, fever (100 or greater) body aches, chills, sore throat, or sudden loss of taste or sense of smell? No . Have you been around anyone with known Covid 1? No . Have you been around anyone who is awaiting Covid 19 test results in the past 7 to 10 days? No . Have you been around anyone who has been exposed to Covid 19, or has mentioned symptoms of Covid 19 within the past 7 to 10 days? No    2. Pt advised of visitor restrictions (no visitors allowed except if needed to conduct the visit). Also advised to arrive at appointment time and wear a mask.

## 2018-11-28 ENCOUNTER — Other Ambulatory Visit (HOSPITAL_COMMUNITY): Payer: Self-pay | Admitting: *Deleted

## 2018-11-28 DIAGNOSIS — R1310 Dysphagia, unspecified: Secondary | ICD-10-CM

## 2018-11-30 ENCOUNTER — Ambulatory Visit (INDEPENDENT_AMBULATORY_CARE_PROVIDER_SITE_OTHER): Payer: PPO | Admitting: *Deleted

## 2018-11-30 ENCOUNTER — Other Ambulatory Visit: Payer: Self-pay

## 2018-11-30 DIAGNOSIS — I359 Nonrheumatic aortic valve disorder, unspecified: Secondary | ICD-10-CM | POA: Diagnosis not present

## 2018-11-30 DIAGNOSIS — I350 Nonrheumatic aortic (valve) stenosis: Secondary | ICD-10-CM | POA: Diagnosis not present

## 2018-11-30 DIAGNOSIS — Z5181 Encounter for therapeutic drug level monitoring: Secondary | ICD-10-CM | POA: Diagnosis not present

## 2018-11-30 LAB — POCT INR: INR: 3 (ref 2.0–3.0)

## 2018-11-30 NOTE — Patient Instructions (Signed)
Description   Continue on same dosage 1 tablet daily except 1/2 tablet on Sundays, Tuesdays and Thursdays. Recheck INR in 8 weeks. Call [phone] call with any new medications or if scheduled for any other procedures.      

## 2018-12-07 ENCOUNTER — Ambulatory Visit (HOSPITAL_COMMUNITY)
Admission: RE | Admit: 2018-12-07 | Discharge: 2018-12-07 | Disposition: A | Discharge status: Home / Self Care | Payer: PPO | Source: Ambulatory Visit | Attending: Neurology | Admitting: Neurology

## 2018-12-07 ENCOUNTER — Other Ambulatory Visit: Payer: Self-pay

## 2018-12-07 DIAGNOSIS — G20 Parkinson's disease: Secondary | ICD-10-CM | POA: Diagnosis not present

## 2018-12-07 DIAGNOSIS — R1312 Dysphagia, oropharyngeal phase: Secondary | ICD-10-CM

## 2018-12-12 ENCOUNTER — Ambulatory Visit (INDEPENDENT_AMBULATORY_CARE_PROVIDER_SITE_OTHER): Payer: PPO | Admitting: *Deleted

## 2018-12-12 DIAGNOSIS — I495 Sick sinus syndrome: Secondary | ICD-10-CM | POA: Diagnosis not present

## 2018-12-12 LAB — CUP PACEART REMOTE DEVICE CHECK
Battery Impedance: 941 Ohm
Battery Remaining Longevity: 72 mo
Battery Voltage: 2.77 V
Brady Statistic RV Percent Paced: 40 %
Date Time Interrogation Session: 20200727125032
Implantable Lead Implant Date: 20060326
Implantable Lead Implant Date: 20060326
Implantable Lead Location: 753859
Implantable Lead Location: 753860
Implantable Lead Model: 5076
Implantable Lead Model: 5076
Implantable Pulse Generator Implant Date: 20120817
Lead Channel Impedance Value: 412 Ohm
Lead Channel Impedance Value: 67 Ohm
Lead Channel Pacing Threshold Amplitude: 0.625 V
Lead Channel Pacing Threshold Pulse Width: 0.4 ms
Lead Channel Setting Pacing Amplitude: 2.5 V
Lead Channel Setting Pacing Pulse Width: 0.4 ms
Lead Channel Setting Sensing Sensitivity: 5.6 mV

## 2018-12-19 ENCOUNTER — Telehealth: Payer: Self-pay | Admitting: Neurology

## 2018-12-19 MED ORDER — CARBIDOPA-LEVODOPA 25-250 MG PO TABS: 1.0000 | ORAL_TABLET | Freq: Three times a day (TID) | ORAL | 2 refills | Status: DC

## 2018-12-19 NOTE — Telephone Encounter (Signed)
Pt wife has called asking for the results to the swallow test and wife is asking if a 90 day script for the carbidopa-levodopa (SINEMET) 25-250 MG tablet can be called into [facility]  Since pt seems to be tolerating the increased dosge

## 2018-12-19 NOTE — Telephone Encounter (Signed)
I called and talk with the wife, they already had the results of the speech therapy evaluation, they recommend dysphagia 3 with thin liquid diet, the patient is a moderate risk for aspiration.  No head position or chin tuck seemed to benefit.  The Sinemet prescription has been sent in.

## 2018-12-19 NOTE — Telephone Encounter (Addendum)
Rx submitted for a 90 day supply. I could not find the results for the swallowing test, will fwd to MD to review further.

## 2018-12-19 NOTE — Addendum Note (Signed)
Addended by: Verlin Grills T on: 12/19/2018 09:39 AM   Modules accepted: Orders

## 2018-12-22 ENCOUNTER — Telehealth: Payer: Self-pay | Admitting: Neurology

## 2018-12-22 NOTE — Telephone Encounter (Addendum)
Received call back from [facility] who stated they have both carb/levo 25/100 mg and carb/levo 25/250 mg on file. She's asking for clarification. I advised her Dr Jannifer Franklin discontinued the 25/100 mg on 02/02/18. Per his office note 11/25/18 the carb/levo was increased to 25/250 mg take one tab three times daily. [name] stated she would correct their record, verbalized understanding, appreciation.

## 2018-12-22 NOTE — Telephone Encounter (Signed)
LVM for Donald [name] requesting call back.

## 2018-12-22 NOTE — Telephone Encounter (Signed)
[name] from [facility] called wanting RN to call back to confirm current dose and directions on the pt's carbidopa-levodopa. The want to know if there was an intended change. [name] states that when RN calls back and she is on the other line please leave a message with pts first and last name and date of birth and also the RN's first and last name with call back number. Please advise.

## 2018-12-30 ENCOUNTER — Encounter: Payer: Self-pay | Admitting: Cardiology

## 2018-12-30 NOTE — Progress Notes (Signed)
Remote pacemaker transmission.   

## 2019-01-06 ENCOUNTER — Other Ambulatory Visit: Payer: Self-pay | Admitting: *Deleted

## 2019-01-06 MED ORDER — WARFARIN SODIUM 5 MG PO TABS: ORAL_TABLET | ORAL | 0 refills | Status: DC

## 2019-01-25 ENCOUNTER — Ambulatory Visit (INDEPENDENT_AMBULATORY_CARE_PROVIDER_SITE_OTHER): Payer: PPO

## 2019-01-25 ENCOUNTER — Other Ambulatory Visit: Payer: Self-pay

## 2019-01-25 DIAGNOSIS — I350 Nonrheumatic aortic (valve) stenosis: Secondary | ICD-10-CM | POA: Diagnosis not present

## 2019-01-25 DIAGNOSIS — I359 Nonrheumatic aortic valve disorder, unspecified: Secondary | ICD-10-CM | POA: Diagnosis not present

## 2019-01-25 DIAGNOSIS — Z5181 Encounter for therapeutic drug level monitoring: Secondary | ICD-10-CM | POA: Diagnosis not present

## 2019-01-25 LAB — POCT INR: INR: 2.9 (ref 2.0–3.0)

## 2019-01-25 NOTE — Patient Instructions (Signed)
Description   Continue on same dosage 1 tablet daily except 1/2 tablet on Sundays, Tuesdays and Thursdays. Recheck INR in 8 weeks. Call [phone] call with any new medications or if scheduled for any other procedures.      

## 2019-02-10 DIAGNOSIS — G20 Parkinson's disease: Secondary | ICD-10-CM | POA: Diagnosis not present

## 2019-02-10 DIAGNOSIS — D692 Other nonthrombocytopenic purpura: Secondary | ICD-10-CM | POA: Diagnosis not present

## 2019-02-10 DIAGNOSIS — H9193 Unspecified hearing loss, bilateral: Secondary | ICD-10-CM | POA: Diagnosis not present

## 2019-02-10 DIAGNOSIS — D6869 Other thrombophilia: Secondary | ICD-10-CM | POA: Diagnosis not present

## 2019-02-10 DIAGNOSIS — I119 Hypertensive heart disease without heart failure: Secondary | ICD-10-CM | POA: Diagnosis not present

## 2019-02-10 DIAGNOSIS — I4821 Permanent atrial fibrillation: Secondary | ICD-10-CM | POA: Diagnosis not present

## 2019-02-10 DIAGNOSIS — I25810 Atherosclerosis of coronary artery bypass graft(s) without angina pectoris: Secondary | ICD-10-CM | POA: Diagnosis not present

## 2019-02-10 DIAGNOSIS — N401 Enlarged prostate with lower urinary tract symptoms: Secondary | ICD-10-CM | POA: Diagnosis not present

## 2019-02-10 DIAGNOSIS — Z7901 Long term (current) use of anticoagulants: Secondary | ICD-10-CM | POA: Diagnosis not present

## 2019-02-10 DIAGNOSIS — H900 Conductive hearing loss, bilateral: Secondary | ICD-10-CM | POA: Diagnosis not present

## 2019-02-10 DIAGNOSIS — E039 Hypothyroidism, unspecified: Secondary | ICD-10-CM | POA: Diagnosis not present

## 2019-02-10 DIAGNOSIS — Z950 Presence of cardiac pacemaker: Secondary | ICD-10-CM | POA: Diagnosis not present

## 2019-02-15 ENCOUNTER — Other Ambulatory Visit: Payer: Self-pay

## 2019-02-15 DIAGNOSIS — Z20822 Contact with and (suspected) exposure to covid-19: Secondary | ICD-10-CM

## 2019-02-15 DIAGNOSIS — R6889 Other general symptoms and signs: Secondary | ICD-10-CM | POA: Diagnosis not present

## 2019-02-17 LAB — NOVEL CORONAVIRUS, NAA: SARS-CoV-2, NAA: NOT DETECTED

## 2019-03-20 ENCOUNTER — Other Ambulatory Visit: Payer: Self-pay | Admitting: *Deleted

## 2019-03-20 MED ORDER — WARFARIN SODIUM 5 MG PO TABS: ORAL_TABLET | ORAL | 0 refills | Status: DC

## 2019-03-22 ENCOUNTER — Ambulatory Visit (INDEPENDENT_AMBULATORY_CARE_PROVIDER_SITE_OTHER): Payer: PPO | Admitting: *Deleted

## 2019-03-22 ENCOUNTER — Other Ambulatory Visit: Payer: Self-pay

## 2019-03-22 DIAGNOSIS — I350 Nonrheumatic aortic (valve) stenosis: Secondary | ICD-10-CM

## 2019-03-22 DIAGNOSIS — I359 Nonrheumatic aortic valve disorder, unspecified: Secondary | ICD-10-CM | POA: Diagnosis not present

## 2019-03-22 DIAGNOSIS — Z5181 Encounter for therapeutic drug level monitoring: Secondary | ICD-10-CM | POA: Diagnosis not present

## 2019-03-22 LAB — POCT INR: INR: 3 (ref 2.0–3.0)

## 2019-03-22 NOTE — Patient Instructions (Signed)
Description   Continue on same dosage 1 tablet daily except 1/2 tablet on Sundays, Tuesdays and Thursdays. Recheck INR in 8 weeks. Call [phone] call with any new medications or if scheduled for any other procedures.      

## 2019-03-23 LAB — CUP PACEART REMOTE DEVICE CHECK
Battery Impedance: 967 Ohm
Battery Remaining Longevity: 71 mo
Battery Voltage: 2.77 V
Brady Statistic RV Percent Paced: 39 %
Date Time Interrogation Session: 20201105133958
Implantable Lead Implant Date: 20060326
Implantable Lead Implant Date: 20060326
Implantable Lead Location: 753859
Implantable Lead Location: 753860
Implantable Lead Model: 5076
Implantable Lead Model: 5076
Implantable Pulse Generator Implant Date: 20120817
Lead Channel Impedance Value: 411 Ohm
Lead Channel Impedance Value: 67 Ohm
Lead Channel Pacing Threshold Amplitude: 0.625 V
Lead Channel Pacing Threshold Pulse Width: 0.4 ms
Lead Channel Setting Pacing Amplitude: 2.5 V
Lead Channel Setting Pacing Pulse Width: 0.4 ms
Lead Channel Setting Sensing Sensitivity: 5.6 mV

## 2019-03-24 ENCOUNTER — Ambulatory Visit (INDEPENDENT_AMBULATORY_CARE_PROVIDER_SITE_OTHER): Payer: PPO | Admitting: *Deleted

## 2019-03-24 DIAGNOSIS — I495 Sick sinus syndrome: Secondary | ICD-10-CM

## 2019-03-24 DIAGNOSIS — I4821 Permanent atrial fibrillation: Secondary | ICD-10-CM | POA: Diagnosis not present

## 2019-03-29 DIAGNOSIS — Z460 Encounter for fitting and adjustment of spectacles and contact lenses: Secondary | ICD-10-CM | POA: Diagnosis not present

## 2019-03-30 ENCOUNTER — Encounter: Payer: Self-pay | Admitting: Neurology

## 2019-03-30 ENCOUNTER — Ambulatory Visit (INDEPENDENT_AMBULATORY_CARE_PROVIDER_SITE_OTHER): Payer: PPO | Admitting: Neurology

## 2019-03-30 ENCOUNTER — Other Ambulatory Visit: Payer: Self-pay

## 2019-03-30 VITALS — BP 124/71 | HR 56 | Temp 97.8°F | Ht 71.0 in | Wt 169.3 lb

## 2019-03-30 DIAGNOSIS — R413 Other amnesia: Secondary | ICD-10-CM | POA: Diagnosis not present

## 2019-03-30 DIAGNOSIS — R269 Unspecified abnormalities of gait and mobility: Secondary | ICD-10-CM | POA: Diagnosis not present

## 2019-03-30 DIAGNOSIS — G20 Parkinson's disease: Secondary | ICD-10-CM

## 2019-03-30 MED ORDER — CARBIDOPA-LEVODOPA 25-250 MG PO TABS: 1.0000 | ORAL_TABLET | Freq: Four times a day (QID) | ORAL | 3 refills | Status: DC

## 2019-03-30 MED ORDER — ENTACAPONE 200 MG PO TABS: 200.0000 mg | ORAL_TABLET | Freq: Four times a day (QID) | ORAL | 3 refills | Status: DC

## 2019-03-30 NOTE — Patient Instructions (Signed)
We will go up on the Sinemet (carbidopa) tablet and the Comtan 200 mg tablet to 4 times a day.   Sinemet (carbidopa) may result in confusion or hallucinations, drowsiness, nausea, or dizziness. If any significant side effects are noted, please contact our office. Sinemet may not be well absorbed when taken with high protein meals, if tolerated it is best to take 30-45 minutes before you eat.

## 2019-03-30 NOTE — Progress Notes (Signed)
Reason for visit: Parkinson's disease  Donald [name] is an 78 y.o. male  History of present illness:  Donald [name] is a 78 year old left-handed white male with a history of Parkinson's disease.  The patient currently is on Sinemet taking the 25/250 mg tablets, 1 tablet 3 times daily.  The patient is on Comtan 200 mg with the Sinemet.  The patient is also taking Requip 2 mg 3 times daily and he takes Artane 0.5 mg daily.  The patient has had 1 fall since last seen, this occurred 2 or 3 weeks ago.  The patient tries to stay active, he walks 1.25 miles daily, he uses a walking stick when he is out walking.  He is having increasing problems with slowness of movement, he is shuffling his feet quite a bit.  He takes about 1/2 to 2 hours in the morning to get going, he still seems to have increased problems with walking in the evenings.  He takes his medications at 7:30 AM, 12 noon, and 9 PM.  Recently he has had troubles with left wrist pain.  The patient returns to this office for an evaluation.  He is extremely hard of hearing.  Past Medical History:  Diagnosis Date  . Abnormality of gait 08/27/2014  . Anxiety   . Aortic stenosis    s/p AVR by Dr Laneta Simmers  . Arthritis    left wrist- probable- not diagonised  . Asthma    in the past  . CAD (coronary artery disease)    s/p CABG 2006  . GERD (gastroesophageal reflux disease)   . H/O seasonal allergies   . H/O: rheumatic fever   . Hearing deficit    Bilateral hearing aids  . Hiatal hernia   . History of kidney stones   . HOH (hard of hearing)   . Hyperlipidemia   . Hypertension   . Hyperthyroidism   . Memory difficulty 08/27/2014  . Pacemaker   . Parkinson's disease   . Persistent atrial fibrillation (HCC)   . Scarlet fever    as a child  . Shortness of breath   . Sick sinus syndrome (HCC)    s/p PPM (MDT) 2006    Past Surgical History:  Procedure Laterality Date  . AORTIC VALVE REPLACEMENT  2006  . CARDIOVERSION N/A 09/14/2013    Procedure: CARDIOVERSION;  Surgeon: Thurmon Fair, MD;  Location: [facility] ENDOSCOPY;  Service: Cardiovascular;  Laterality: N/A;  . CHOLECYSTECTOMY N/A 07/21/2013   Procedure: LAPAROSCOPIC CHOLECYSTECTOMY;  Surgeon: Mariella Saa, MD;  Location: [facility] OR;  Service: General;  Laterality: N/A;  . COLONOSCOPY W/ POLYPECTOMY    . COLONOSCOPY WITH PROPOFOL N/A 08/31/2016   Procedure: COLONOSCOPY WITH PROPOFOL;  Surgeon: Charlott Rakes, MD;  Location: [facility] ENDOSCOPY;  Service: Endoscopy;  Laterality: N/A;  . CORONARY ARTERY BYPASS GRAFT  2006  . DENTAL SURGERY     implants  . INSERT / REPLACE / REMOVE PACEMAKER  2006, 2012   MDT for sick sinus syndrome, gen change 8/12 by JA  . KIDNEY STONE SURGERY  1984    Family History  Problem Relation Age of Onset  . Dementia Mother   . Parkinsonism Mother   . Heart disease Father   . Heart disease Sister   . Heart disease Brother   . Tremor Brother        Unilateral tremor  . Coronary artery disease Unknown        family hx of    Social history:  reports that he has never smoked. He has never used smokeless tobacco. He reports that he does not drink alcohol or use drugs.    Allergies  Allergen Reactions  . Sulfa Antibiotics     unknown    Medications:  Prior to Admission medications   Medication Sig Start Date End Date Taking? Authorizing Provider  aspirin 81 MG tablet Take 81 mg by mouth daily.     Yes [provider]  atorvastatin (LIPITOR) 20 MG tablet Take 1 tablet by mouth daily 08/05/18  Yes Allred, Jeneen Rinks, MD  carbidopa-levodopa (SINEMET) 25-250 MG tablet Take 1 tablet by mouth 3 (three) times daily. 12/19/18  Yes Kathrynn Ducking, MD  entacapone (COMTAN) 200 MG tablet Take 1 tablet (200 mg total) by mouth 3 (three) times daily. 11/25/18  Yes Kathrynn Ducking, MD  esomeprazole (NEXIUM) 20 MG capsule Take 20 mg by mouth daily at 12 noon.   Yes [provider]  levothyroxine (SYNTHROID, LEVOTHROID) 125 MCG tablet Take 125  mcg by mouth daily.     Yes [provider]  metoprolol succinate (TOPROL-XL) 25 MG 24 hr tablet Take 1 tablet (25 mg total) by mouth daily. 08/29/18  Yes Allred, Jeneen Rinks, MD  nitroGLYCERIN (NITROSTAT) 0.4 MG SL tablet PLACE 1 TABLET UNDER TONGUE EVERY 5 MINUTES FOR 3 DOSES AS NEEDED FOR CHEST PAIN 05/02/18  Yes Burtis Junes, NP  rOPINIRole (REQUIP) 2 MG tablet Take 1 tablet (2 mg total) by mouth 3 (three) times daily. 11/25/18  Yes Kathrynn Ducking, MD  trihexyphenidyl (ARTANE) 2 MG tablet Take 0.5 tablets (1 mg total) by mouth daily. 11/25/18  Yes Kathrynn Ducking, MD  UNABLE TO FIND Med Name: Hemp oil   Yes [provider]  warfarin (COUMADIN) 5 MG tablet Take 1/2 to 1 tablet daily or as directed by Coumadin clinic. 03/20/19  Yes Nahser, Wonda Cheng, MD    ROS:  Out of a complete 14 system review of symptoms, the patient complains only of the following symptoms, and all other reviewed systems are negative.  Hard of hearing Tremors Walking difficulty  Blood pressure 124/71, pulse (!) 56, temperature 97.8 F (36.6 C), temperature source Temporal, height 5\' 11"  (1.803 m), weight 169 lb 5 oz (76.8 kg).  Physical Exam  General: The patient is alert and cooperative at the time of the examination.  Skin: No significant peripheral edema is noted.   Neurologic Exam  Mental status: The patient is alert and oriented x 3 at the time of the examination. The patient has apparent normal recent and remote memory, with an apparently normal attention span and concentration ability.   Cranial nerves: Facial symmetry is present. Speech is normal, no aphasia or dysarthria is noted. Extraocular movements are full. Visual fields are full.  Masking of the face is seen.  The patient is hard of hearing.  Motor: The patient has good strength in all 4 extremities.  Sensory examination: Soft touch sensation is symmetric on the face, arms, and legs.  Coordination: The patient has good  finger-nose-finger and heel-to-shin bilaterally.  Tinel's sign at the wrists are positive bilaterally.  Gait and station: The patient is able to arise from a seated position with arms crossed.  Once up, he can walk independently, he shuffles his feet.  Tandem gait was not attempted.  Romberg is negative.  Reflexes: Deep tendon reflexes are symmetric.   Assessment/Plan:  1.  Parkinson's disease  2.  Gait instability  3.  Left wrist  pain, possible carpal tunnel syndrome  4.  Dysphagia  The patient will be increased on the Sinemet taking the 25/250 mg tablets 4 times daily, along with Comtan.  Prescriptions for these medications were sent in.  The patient will wear a wrist splint on the left wrist and if he is not improving they are to contact me and we will consider nerve conduction studies.  The patient has positive bilateral Tinel signs at the wrists, he could have carpal tunnel syndrome.  The patient has been evaluated for dysphagia, he was placed on it dysphagia 3, thin liquid diet.  He will follow-up here in 5 months.  Marlan Palau. Keith Willis MD 03/30/2019 9:19 AM  [facility] [address] [place], [place] [id number]  Phone [phone] Fax [phone]

## 2019-04-09 NOTE — Progress Notes (Signed)
Remote pacemaker transmission.   

## 2019-04-21 ENCOUNTER — Other Ambulatory Visit: Payer: Self-pay | Admitting: Internal Medicine

## 2019-05-17 DIAGNOSIS — Z03818 Encounter for observation for suspected exposure to other biological agents ruled out: Secondary | ICD-10-CM | POA: Diagnosis not present

## 2019-05-17 DIAGNOSIS — Z20828 Contact with and (suspected) exposure to other viral communicable diseases: Secondary | ICD-10-CM | POA: Diagnosis not present

## 2019-05-22 ENCOUNTER — Other Ambulatory Visit: Payer: Self-pay | Admitting: Nurse Practitioner

## 2019-05-24 ENCOUNTER — Other Ambulatory Visit: Payer: Self-pay

## 2019-05-24 ENCOUNTER — Ambulatory Visit (INDEPENDENT_AMBULATORY_CARE_PROVIDER_SITE_OTHER): Payer: PPO | Admitting: *Deleted

## 2019-05-24 DIAGNOSIS — Z5181 Encounter for therapeutic drug level monitoring: Secondary | ICD-10-CM

## 2019-05-24 DIAGNOSIS — I350 Nonrheumatic aortic (valve) stenosis: Secondary | ICD-10-CM | POA: Diagnosis not present

## 2019-05-24 DIAGNOSIS — I359 Nonrheumatic aortic valve disorder, unspecified: Secondary | ICD-10-CM | POA: Diagnosis not present

## 2019-05-24 LAB — POCT INR: INR: 2.6 (ref 2.0–3.0)

## 2019-05-24 NOTE — Patient Instructions (Signed)
Description   Continue on same dosage 1 tablet daily except 1/2 tablet on Sundays, Tuesdays and Thursdays. Recheck INR in 8 weeks. Call [phone] call with any new medications or if scheduled for any other procedures.

## 2019-05-27 ENCOUNTER — Other Ambulatory Visit: Payer: Self-pay | Admitting: Neurology

## 2019-05-27 DIAGNOSIS — R311 Benign essential microscopic hematuria: Secondary | ICD-10-CM | POA: Diagnosis not present

## 2019-05-29 ENCOUNTER — Other Ambulatory Visit: Payer: Self-pay | Admitting: Internal Medicine

## 2019-05-30 ENCOUNTER — Other Ambulatory Visit: Payer: Self-pay | Admitting: Internal Medicine

## 2019-05-30 MED ORDER — METOPROLOL SUCCINATE ER 25 MG PO TB24: 25.0000 mg | ORAL_TABLET | Freq: Every day | ORAL | 0 refills | Status: DC

## 2019-05-30 NOTE — Telephone Encounter (Signed)
Pt's medication was sent to pt's pharmacy as requested. Confirmation received.  °

## 2019-06-15 ENCOUNTER — Ambulatory Visit: Payer: PPO | Admitting: Physician Assistant

## 2019-06-16 ENCOUNTER — Ambulatory Visit: Payer: PPO

## 2019-06-20 ENCOUNTER — Ambulatory Visit: Payer: PPO | Admitting: Student

## 2019-06-20 ENCOUNTER — Other Ambulatory Visit: Payer: Self-pay

## 2019-06-20 ENCOUNTER — Encounter: Payer: Self-pay | Admitting: Student

## 2019-06-20 VITALS — BP 96/58 | HR 62 | Ht 71.0 in | Wt 167.2 lb

## 2019-06-20 DIAGNOSIS — Z950 Presence of cardiac pacemaker: Secondary | ICD-10-CM

## 2019-06-20 DIAGNOSIS — I4821 Permanent atrial fibrillation: Secondary | ICD-10-CM

## 2019-06-20 DIAGNOSIS — I10 Essential (primary) hypertension: Secondary | ICD-10-CM

## 2019-06-20 DIAGNOSIS — I359 Nonrheumatic aortic valve disorder, unspecified: Secondary | ICD-10-CM

## 2019-06-20 DIAGNOSIS — I495 Sick sinus syndrome: Secondary | ICD-10-CM

## 2019-06-20 LAB — CUP PACEART INCLINIC DEVICE CHECK
Battery Impedance: 1046 Ohm
Battery Remaining Longevity: 67 mo
Battery Voltage: 2.76 V
Brady Statistic RV Percent Paced: 41 %
Date Time Interrogation Session: 20210202142037
Implantable Lead Implant Date: 20060326
Implantable Lead Implant Date: 20060326
Implantable Lead Location: 753859
Implantable Lead Location: 753860
Implantable Lead Model: 5076
Implantable Lead Model: 5076
Implantable Pulse Generator Implant Date: 20120817
Lead Channel Impedance Value: 388 Ohm
Lead Channel Impedance Value: 67 Ohm
Lead Channel Pacing Threshold Amplitude: 0.75 V
Lead Channel Pacing Threshold Amplitude: 0.875 V
Lead Channel Pacing Threshold Pulse Width: 0.4 ms
Lead Channel Pacing Threshold Pulse Width: 0.4 ms
Lead Channel Sensing Intrinsic Amplitude: 15.67 mV
Lead Channel Setting Pacing Amplitude: 2.5 V
Lead Channel Setting Pacing Pulse Width: 0.4 ms
Lead Channel Setting Sensing Sensitivity: 5.6 mV

## 2019-06-20 NOTE — Patient Instructions (Signed)
Medication Instructions:  none *If you need a refill on your cardiac medications before your next appointment, please call your pharmacy*  Lab Work: none If you have labs (blood work) drawn today and your tests are completely normal, you will receive your results only by: Marland Kitchen MyChart Message (if you have MyChart) OR . A paper copy in the mail If you have any lab test that is abnormal or we need to change your treatment, we will call you to review the results.  Testing/Procedures: none  Follow-Up: At [facility], you and your health needs are our priority.  As part of our continuing mission to provide you with exceptional heart care, we have created designated Provider Care Teams.  These Care Teams include your primary Cardiologist (physician) and Advanced Practice Providers (APPs -  Physician Assistants and Nurse Practitioners) who all work together to provide you with the care you need, when you need it.  Your next appointment:   1 year(s)  The format for your next appointment:   Either In Person or Virtual  Provider:   Dr Johney Frame  Other Instructions Remote monitoring is used to monitor your Pacemaker  from home. This monitoring reduces the number of office visits required to check your device to one time per year. It allows [place] to keep an eye on the functioning of your device to ensure it is working properly. You are scheduled for a device check from home on 06/23/19. You may send your transmission at any time that day. If you have a wireless device, the transmission will be sent automatically. After your physician reviews your transmission, you will receive a postcard with your next transmission date.

## 2019-06-20 NOTE — Progress Notes (Signed)
Electrophysiology Office Note Date: 06/20/2019  ID:  Donald [name], DOB [birth year], MRN [id number]  PCP: Gaspar Garbe, MD Electrophysiologist: Dr. Johney Frame   CC: Pacemaker follow-up  Donald [name] is a 79 y.o. male seen today for Dr. Johney Frame . he presents today for routine electrophysiology followup.  Since last being seen in our clinic, the patient reports doing very well.  he denies chest pain, palpitations, dyspnea, PND, orthopnea, nausea, vomiting, dizziness, syncope, edema, weight gain, or early satiety. He did have a fall in the yard yesterday. Denies injury. Likely due to parkinsons.  Device History: Medtronic Dual Chamber PPM implanted 01/02/2011 for SSS/Tachy-Brady  Past Medical History:  Diagnosis Date  . Abnormality of gait 08/27/2014  . Anxiety   . Aortic stenosis    s/p AVR by Dr Laneta Simmers  . Arthritis    left wrist- probable- not diagonised  . Asthma    in the past  . CAD (coronary artery disease)    s/p CABG 2006  . GERD (gastroesophageal reflux disease)   . H/O seasonal allergies   . H/O: rheumatic fever   . Hearing deficit    Bilateral hearing aids  . Hiatal hernia   . History of kidney stones   . HOH (hard of hearing)   . Hyperlipidemia   . Hypertension   . Hyperthyroidism   . Memory difficulty 08/27/2014  . Pacemaker   . Parkinson's disease   . Persistent atrial fibrillation (HCC)   . Scarlet fever    as a child  . Shortness of breath   . Sick sinus syndrome (HCC)    s/p PPM (MDT) 2006   Past Surgical History:  Procedure Laterality Date  . AORTIC VALVE REPLACEMENT  2006  . CARDIOVERSION N/A 09/14/2013   Procedure: CARDIOVERSION;  Surgeon: Thurmon Fair, MD;  Location: [facility] ENDOSCOPY;  Service: Cardiovascular;  Laterality: N/A;  . CHOLECYSTECTOMY N/A 07/21/2013   Procedure: LAPAROSCOPIC CHOLECYSTECTOMY;  Surgeon: Mariella Saa, MD;  Location: [facility] OR;  Service: General;  Laterality: N/A;  . COLONOSCOPY W/ POLYPECTOMY    . COLONOSCOPY WITH  PROPOFOL N/A 08/31/2016   Procedure: COLONOSCOPY WITH PROPOFOL;  Surgeon: Charlott Rakes, MD;  Location: [facility] ENDOSCOPY;  Service: Endoscopy;  Laterality: N/A;  . CORONARY ARTERY BYPASS GRAFT  2006  . DENTAL SURGERY     implants  . INSERT / REPLACE / REMOVE PACEMAKER  2006, 2012   MDT for sick sinus syndrome, gen change 8/12 by JA  . KIDNEY STONE SURGERY  1984    Current Outpatient Medications  Medication Sig Dispense Refill  . aspirin 81 MG tablet Take 81 mg by mouth daily.      Marland Kitchen atorvastatin (LIPITOR) 20 MG tablet Take 1 tablet (20 mg total) by mouth daily. Please make yearly appt with Dr. Johney Frame for January before anymore refills. 1st attempt 90 tablet 0  . carbidopa-levodopa (SINEMET) 25-250 MG tablet Take 1 tablet by mouth 4 (four) times daily. 360 tablet 3  . entacapone (COMTAN) 200 MG tablet Take 1 tablet (200 mg total) by mouth 4 (four) times daily. 360 tablet 3  . esomeprazole (NEXIUM) 20 MG capsule Take 20 mg by mouth daily at 12 noon.    Marland Kitchen levothyroxine (SYNTHROID, LEVOTHROID) 125 MCG tablet Take 125 mcg by mouth daily.      . metoprolol succinate (TOPROL-XL) 25 MG 24 hr tablet Take 1 tablet (25 mg total) by mouth daily. Please keep upcoming appt in January before anymore refills. Thank you 90  tablet 0  . nitroGLYCERIN (NITROSTAT) 0.4 MG SL tablet PLACE 1 TABLET UNDER TONGUE EVERY 5 MINUTES FOR 3 DOSES AS NEEDED FOR CHEST PAIN 25 tablet 3  . rOPINIRole (REQUIP) 2 MG tablet Take 1 tablet (2 mg total) by mouth 3 (three) times daily. 270 tablet 3  . trihexyphenidyl (ARTANE) 2 MG tablet TAKE 1/2 TABLET DAILY 45 tablet 3  . UNABLE TO FIND Med Name: Hemp oil    . warfarin (COUMADIN) 5 MG tablet Take 1/2 to 1 tablet daily or as directed by Coumadin clinic. 80 tablet 0   No current facility-administered medications for this visit.    Allergies:   Sulfa antibiotics   Social History: Social History   Socioeconomic History  . Marital status: Married    Spouse name: barbars  .  Number of children: 2  . Years of education: HS  . Highest education level: Not on file  Occupational History  . Occupation: N/A  Tobacco Use  . Smoking status: Never Smoker  . Smokeless tobacco: Never Used  Substance and Sexual Activity  . Alcohol use: No  . Drug use: No  . Sexual activity: Yes  Other Topics Concern  . Not on file  Social History Narrative   Lives at home w/ his wife   Patient is left handed.   Patient does not drink caffeine.   Social Determinants of Health   Financial Resource Strain:   . Difficulty of Paying Living Expenses: Not on file  Food Insecurity:   . Worried About Charity fundraiser in the Last Year: Not on file  . Ran Out of Food in the Last Year: Not on file  Transportation Needs:   . Lack of Transportation (Medical): Not on file  . Lack of Transportation (Non-Medical): Not on file  Physical Activity:   . Days of Exercise per Week: Not on file  . Minutes of Exercise per Session: Not on file  Stress:   . Feeling of Stress : Not on file  Social Connections:   . Frequency of Communication with Friends and Family: Not on file  . Frequency of Social Gatherings with Friends and Family: Not on file  . Attends Religious Services: Not on file  . Active Member of Clubs or Organizations: Not on file  . Attends Archivist Meetings: Not on file  . Marital Status: Not on file  Intimate Partner Violence:   . Fear of Current or Ex-Partner: Not on file  . Emotionally Abused: Not on file  . Physically Abused: Not on file  . Sexually Abused: Not on file    Family History: Family History  Problem Relation Age of Onset  . Dementia Mother   . Parkinsonism Mother   . Heart disease Father   . Heart disease Sister   . Heart disease Brother   . Tremor Brother        Unilateral tremor  . Coronary artery disease Unknown        family hx of     Review of Systems: All other systems reviewed and are otherwise negative except as noted  above.  Physical Exam: Vitals:   06/20/19 1301  BP: (!) 96/58  Pulse: 62  SpO2: 99%  Weight: 167 lb 3.2 oz (75.8 kg)  Height: 5\' 11"  (1.803 m)     GEN- The patient is well appearing, alert and oriented x 3 today.   HEENT: normocephalic, atraumatic; sclera clear, conjunctiva pink; hearing intact; oropharynx clear; neck supple  Lungs- Clear to ausculation bilaterally, normal work of breathing.  No wheezes, rales, rhonchi Heart- Regular rate and rhythm, no murmurs, rubs or gallops  GI- soft, non-tender, non-distended, bowel sounds present  Extremities- no clubbing, cyanosis, or edema  MS- no significant deformity or atrophy Skin- warm and dry, no rash or lesion; PPM pocket well healed Psych- euthymic mood, full affect Neuro- strength and sensation are intact  PPM Interrogation- reviewed in detail today,  See PACEART report  EKG:  EKG is ordered today. The ekg ordered today shows V paced rhythm at 62 bpm  Recent Labs: No results found for requested labs within last 8760 hours.   Wt Readings from Last 3 Encounters:  06/20/19 167 lb 3.2 oz (75.8 kg)  03/30/19 169 lb 5 oz (76.8 kg)  06/15/18 186 lb (84.4 kg)     Other studies Reviewed: Additional studies/ records that were reviewed today include: Previous EP office notes, Previous remote checks, Most recent labwork.   Assessment and Plan:  1.  Symptomatic bradycardia due to tachy-brady syndrome s/p Medtronic PPM  Normal PPM function See Pace Art report No changes today  2. Permanent Afib Rate controlled Asymptomatic Continue coumadin  3. HTN Borderline low. No change today. Can decrease Metoprolol as needed  4. S/p AVR 2006 Continue coumadin and ASA  Current medicines are reviewed at length with the patient today.   The patient does not have concerns regarding his medicines.  The following changes were made today:  none  Labs/ tests ordered today include:  Orders Placed This Encounter  Procedures  . EKG  12-Lead   Disposition:   Follow up with Dr. Johney Frame in 12 Months  Signed, Graciella Freer, PA-C  06/20/2019 2:20 PM  [facility] [address] [place] [address] [phone] (office) [phone] (fax)

## 2019-06-23 ENCOUNTER — Ambulatory Visit (INDEPENDENT_AMBULATORY_CARE_PROVIDER_SITE_OTHER): Payer: PPO | Admitting: *Deleted

## 2019-06-23 DIAGNOSIS — I4821 Permanent atrial fibrillation: Secondary | ICD-10-CM

## 2019-06-23 LAB — CUP PACEART REMOTE DEVICE CHECK
Battery Impedance: 1047 Ohm
Battery Remaining Longevity: 66 mo
Battery Voltage: 2.76 V
Brady Statistic RV Percent Paced: 50 %
Date Time Interrogation Session: 20210205080454
Implantable Lead Implant Date: 20060325190000
Implantable Lead Implant Date: 20060325190000
Implantable Lead Location: 753859
Implantable Lead Location: 753860
Implantable Lead Model: 5076
Implantable Lead Model: 5076
Implantable Pulse Generator Implant Date: 20120816200000
Lead Channel Impedance Value: 406 Ohm
Lead Channel Impedance Value: 67 Ohm
Lead Channel Pacing Threshold Amplitude: 0.625 V
Lead Channel Pacing Threshold Pulse Width: 0.4 ms
Lead Channel Setting Pacing Amplitude: 2.5 V
Lead Channel Setting Pacing Pulse Width: 0.4 ms
Lead Channel Setting Sensing Sensitivity: 5.6 mV

## 2019-06-23 NOTE — Progress Notes (Signed)
PPM Remote  

## 2019-07-05 ENCOUNTER — Telehealth: Payer: Self-pay | Admitting: Neurology

## 2019-07-05 NOTE — Telephone Encounter (Signed)
Pts wife called stating they would like a rx for a helmet due to the pt falling more frequent and becoming more weak. Requesting a call to discuss further.

## 2019-07-05 NOTE — Telephone Encounter (Signed)
I called the patient and talk with the wife.  The patient is had several falls recently.  He has bumped his head on occasion, he is on blood thinners.  She is concerned about this issue.  She wishes to get a medical helmet for him to wear to protect his head.  I have recommended that a cheaper option would be to go get a biker's helmet and fitted to the head and have him wear that.  I recommended physical therapy, if the patient is amenable to this, they will call me back and I will get this set up, they would like to go somewhere near [place].

## 2019-07-10 ENCOUNTER — Other Ambulatory Visit: Payer: Self-pay

## 2019-07-10 DIAGNOSIS — W19XXXA Unspecified fall, initial encounter: Secondary | ICD-10-CM

## 2019-07-10 NOTE — Telephone Encounter (Signed)
Referral put in for outpatient clinic in [place] per wife request. Sent to [facility] for processing.

## 2019-07-10 NOTE — Telephone Encounter (Signed)
Patient [name] LVM stating the want to get the physical therapy and would like to be referred to a clinic in [place]

## 2019-07-12 ENCOUNTER — Telehealth: Payer: Self-pay | Admitting: Neurology

## 2019-07-12 DIAGNOSIS — R260 Ataxic gait: Secondary | ICD-10-CM | POA: Diagnosis not present

## 2019-07-12 NOTE — Telephone Encounter (Signed)
Spoke to patient's wife and relayed I had already sent referral . Patient wife called back and relayed referral was there .

## 2019-07-12 NOTE — Telephone Encounter (Signed)
Donald [name],Donald [name](wife)has called to f/u on the referral for pt to go to [facility].  Wife told the referral shows just as of the 22nd and to allow time for processing, please call

## 2019-07-14 DIAGNOSIS — R260 Ataxic gait: Secondary | ICD-10-CM | POA: Diagnosis not present

## 2019-07-18 DIAGNOSIS — R260 Ataxic gait: Secondary | ICD-10-CM | POA: Diagnosis not present

## 2019-07-19 ENCOUNTER — Ambulatory Visit (INDEPENDENT_AMBULATORY_CARE_PROVIDER_SITE_OTHER): Payer: PPO | Admitting: *Deleted

## 2019-07-19 ENCOUNTER — Other Ambulatory Visit: Payer: Self-pay

## 2019-07-19 DIAGNOSIS — I359 Nonrheumatic aortic valve disorder, unspecified: Secondary | ICD-10-CM

## 2019-07-19 DIAGNOSIS — Z5181 Encounter for therapeutic drug level monitoring: Secondary | ICD-10-CM | POA: Diagnosis not present

## 2019-07-19 DIAGNOSIS — I350 Nonrheumatic aortic (valve) stenosis: Secondary | ICD-10-CM | POA: Diagnosis not present

## 2019-07-19 LAB — POCT INR: INR: 2.6 (ref 2.0–3.0)

## 2019-07-19 NOTE — Patient Instructions (Signed)
Description   Tomorrow take 1 tablet then continue on same dosage 1 tablet daily except 1/2 tablet on Sundays, Tuesdays and Thursdays. Recheck INR in 8 weeks. Call [phone] call with any new medications or if scheduled for any other procedures.

## 2019-07-20 DIAGNOSIS — R260 Ataxic gait: Secondary | ICD-10-CM | POA: Diagnosis not present

## 2019-07-24 DIAGNOSIS — Z125 Encounter for screening for malignant neoplasm of prostate: Secondary | ICD-10-CM | POA: Diagnosis not present

## 2019-07-24 DIAGNOSIS — E7800 Pure hypercholesterolemia, unspecified: Secondary | ICD-10-CM | POA: Diagnosis not present

## 2019-07-24 DIAGNOSIS — E038 Other specified hypothyroidism: Secondary | ICD-10-CM | POA: Diagnosis not present

## 2019-07-25 DIAGNOSIS — R260 Ataxic gait: Secondary | ICD-10-CM | POA: Diagnosis not present

## 2019-07-27 ENCOUNTER — Other Ambulatory Visit: Payer: Self-pay | Admitting: Internal Medicine

## 2019-07-27 DIAGNOSIS — R260 Ataxic gait: Secondary | ICD-10-CM | POA: Diagnosis not present

## 2019-07-31 ENCOUNTER — Telehealth: Payer: Self-pay | Admitting: Neurology

## 2019-07-31 DIAGNOSIS — Z1339 Encounter for screening examination for other mental health and behavioral disorders: Secondary | ICD-10-CM | POA: Diagnosis not present

## 2019-07-31 DIAGNOSIS — N401 Enlarged prostate with lower urinary tract symptoms: Secondary | ICD-10-CM | POA: Diagnosis not present

## 2019-07-31 DIAGNOSIS — H900 Conductive hearing loss, bilateral: Secondary | ICD-10-CM | POA: Diagnosis not present

## 2019-07-31 DIAGNOSIS — Z952 Presence of prosthetic heart valve: Secondary | ICD-10-CM | POA: Diagnosis not present

## 2019-07-31 DIAGNOSIS — R82998 Other abnormal findings in urine: Secondary | ICD-10-CM | POA: Diagnosis not present

## 2019-07-31 DIAGNOSIS — E039 Hypothyroidism, unspecified: Secondary | ICD-10-CM | POA: Diagnosis not present

## 2019-07-31 DIAGNOSIS — D6869 Other thrombophilia: Secondary | ICD-10-CM | POA: Diagnosis not present

## 2019-07-31 DIAGNOSIS — D692 Other nonthrombocytopenic purpura: Secondary | ICD-10-CM | POA: Diagnosis not present

## 2019-07-31 DIAGNOSIS — Z1331 Encounter for screening for depression: Secondary | ICD-10-CM | POA: Diagnosis not present

## 2019-07-31 DIAGNOSIS — E7800 Pure hypercholesterolemia, unspecified: Secondary | ICD-10-CM | POA: Diagnosis not present

## 2019-07-31 DIAGNOSIS — I4821 Permanent atrial fibrillation: Secondary | ICD-10-CM | POA: Diagnosis not present

## 2019-07-31 DIAGNOSIS — Z1212 Encounter for screening for malignant neoplasm of rectum: Secondary | ICD-10-CM | POA: Diagnosis not present

## 2019-07-31 DIAGNOSIS — Z7901 Long term (current) use of anticoagulants: Secondary | ICD-10-CM | POA: Diagnosis not present

## 2019-07-31 DIAGNOSIS — Z950 Presence of cardiac pacemaker: Secondary | ICD-10-CM | POA: Diagnosis not present

## 2019-07-31 DIAGNOSIS — Z0000 Encounter for general adult medical examination without abnormal findings: Secondary | ICD-10-CM | POA: Diagnosis not present

## 2019-07-31 DIAGNOSIS — G20 Parkinson's disease: Secondary | ICD-10-CM | POA: Diagnosis not present

## 2019-07-31 NOTE — Telephone Encounter (Signed)
I have received blood work results done on this patient on 24 July 2019.  Glucose is 102, BUN of 18, creatinine 0.7, sodium 138, potassium 4.6, chloride 104, CO2 26, calcium 9.6, total protein 6.5, albumin 3.8, AST of 25, ALT of 7 alkaline phosphatase of 110.  White blood count was 5.47, hemoglobin of 11.8, hematocrit of 39.5, MCV of 85.6, platelets of 237  TSH of 2.10, free T4 0.9 which is normal, PSA of 0.264

## 2019-08-01 DIAGNOSIS — R260 Ataxic gait: Secondary | ICD-10-CM | POA: Diagnosis not present

## 2019-08-02 ENCOUNTER — Telehealth: Payer: Self-pay | Admitting: Neurology

## 2019-08-02 MED ORDER — CARBIDOPA-LEVODOPA 25-100 MG PO TABS: ORAL_TABLET | ORAL | 1 refills | Status: DC

## 2019-08-02 NOTE — Telephone Encounter (Signed)
Pt's wife called wanting to speak to provider about the several falls the pt has had. Pt has fallen 3 times in the last 4 days and she is very concerned. Please advise.

## 2019-08-02 NOTE — Telephone Encounter (Signed)
I called the wife, the patient is had several falls recently.  He tends to fall when he is making a turn, he may freeze up a bit with the turns.  He is not using a cane for ambulation, he is getting physical therapy currently.  I will have the wife asked the therapist whether he may benefit from the use of a cane.  I will go up on the Sinemet slightly to adding one half of a 25/100 mg Sinemet to the first and third doses of the Sinemet 25/250 with Comtan.

## 2019-08-03 DIAGNOSIS — R260 Ataxic gait: Secondary | ICD-10-CM | POA: Diagnosis not present

## 2019-08-08 ENCOUNTER — Telehealth: Payer: Self-pay | Admitting: Neurology

## 2019-08-08 DIAGNOSIS — R260 Ataxic gait: Secondary | ICD-10-CM | POA: Diagnosis not present

## 2019-08-08 DIAGNOSIS — G20 Parkinson's disease: Secondary | ICD-10-CM

## 2019-08-08 NOTE — Telephone Encounter (Signed)
If wife calls back I dont see any current phone notes about Dr. Anne Hahn recommend a lift chair. Dr. Anne Hahn will be in the office on Monday to discuss her request.  Vm was left for wife to call back .

## 2019-08-08 NOTE — Telephone Encounter (Signed)
Phone rep checked office voicemail's, @ 10:06 this morning wife(Steege,[name] on DPR) left message stating they are looking into a Lift chair for pt, they found out some cost can be reimbursed if Dr Anne Hahn would write a prescription for the Lift chair, please call wife to discuss

## 2019-08-10 DIAGNOSIS — R260 Ataxic gait: Secondary | ICD-10-CM | POA: Diagnosis not present

## 2019-08-14 NOTE — Telephone Encounter (Signed)
Left vm that prescription for lift chair is ready. LEft vm if she wanted it fax to a specific company or put in the mail.

## 2019-08-14 NOTE — Telephone Encounter (Signed)
I will write a prescription for a lift chair.

## 2019-08-14 NOTE — Addendum Note (Signed)
Addended by: York Spaniel on: 08/14/2019 07:12 AM   Modules accepted: Orders

## 2019-08-14 NOTE — Telephone Encounter (Signed)
Lift chair prescription order fax to  [phone] twice and confirmed.

## 2019-08-14 NOTE — Telephone Encounter (Signed)
Patient wife called back to advise prescription needs to be sent to [facility]  Fax#[phone]

## 2019-08-15 DIAGNOSIS — R260 Ataxic gait: Secondary | ICD-10-CM | POA: Diagnosis not present

## 2019-08-16 ENCOUNTER — Ambulatory Visit: Payer: PPO | Admitting: Podiatry

## 2019-08-16 ENCOUNTER — Other Ambulatory Visit: Payer: Self-pay

## 2019-08-16 DIAGNOSIS — L989 Disorder of the skin and subcutaneous tissue, unspecified: Secondary | ICD-10-CM | POA: Diagnosis not present

## 2019-08-16 DIAGNOSIS — B351 Tinea unguium: Secondary | ICD-10-CM | POA: Diagnosis not present

## 2019-08-16 DIAGNOSIS — M79676 Pain in unspecified toe(s): Secondary | ICD-10-CM | POA: Diagnosis not present

## 2019-08-17 ENCOUNTER — Other Ambulatory Visit: Payer: Self-pay

## 2019-08-17 DIAGNOSIS — R260 Ataxic gait: Secondary | ICD-10-CM | POA: Diagnosis not present

## 2019-08-17 MED ORDER — ATORVASTATIN CALCIUM 20 MG PO TABS: 20.0000 mg | ORAL_TABLET | Freq: Every day | ORAL | 3 refills | Status: DC

## 2019-08-22 DIAGNOSIS — R260 Ataxic gait: Secondary | ICD-10-CM | POA: Diagnosis not present

## 2019-08-23 NOTE — Progress Notes (Signed)
    Subjective: Patient is a 79 y.o. male presenting to the office today as a new patient with a chief complaint of painful callus lesion(s) noted to the bilateral feet that have been present for the past 2-3 weeks. Walking increases the pain. He has been self trimming the areas for treatment.  Patient also complains of elongated, thickened nails that cause pain while ambulating in shoes. He is unable to trim his own nails. Patient presents today for further treatment and evaluation.  Past Medical History:  Diagnosis Date  . Abnormality of gait 08/27/2014  . Anxiety   . Aortic stenosis    s/p AVR by Dr Laneta Simmers  . Arthritis    left wrist- probable- not diagonised  . Asthma    in the past  . CAD (coronary artery disease)    s/p CABG 2006  . GERD (gastroesophageal reflux disease)   . H/O seasonal allergies   . H/O: rheumatic fever   . Hearing deficit    Bilateral hearing aids  . Hiatal hernia   . History of kidney stones   . HOH (hard of hearing)   . Hyperlipidemia   . Hypertension   . Hyperthyroidism   . Memory difficulty 08/27/2014  . Pacemaker   . Parkinson's disease   . Persistent atrial fibrillation (HCC)   . Scarlet fever    as a child  . Shortness of breath   . Sick sinus syndrome (HCC)    s/p PPM (MDT) 2006    Objective:  Physical Exam General: Alert and oriented x3 in no acute distress  Dermatology: Hyperkeratotic lesion(s) present on the bilateral feet. Pain on palpation with a central nucleated core noted. Skin is warm, dry and supple bilateral lower extremities. Negative for open lesions or macerations. Nails are tender, long, thickened and dystrophic with subungual debris, consistent with onychomycosis, 1-5 bilateral. No signs of infection noted.  Vascular: Palpable pedal pulses bilaterally. No edema or erythema noted. Capillary refill within normal limits.  Neurological: Epicritic and protective threshold grossly intact bilaterally.   Musculoskeletal Exam:  Pain on palpation at the keratotic lesion(s) noted. Range of motion within normal limits bilateral. Muscle strength 5/5 in all groups bilateral.  Assessment: 1. Onychodystrophic nails 1-5 bilateral with hyperkeratosis of nails.  2. Onychomycosis of nail due to dermatophyte bilateral 3. Porokeratosis noted to the bilateral feet x 5    Plan of Care:  1. Patient evaluated. 2. Excisional debridement of keratoic lesion(s) using a chisel blade was performed without incident.  3. Dressed with light dressing. 4. Mechanical debridement of nails 1-5 bilaterally performed using a nail nipper. Filed with dremel without incident.  5. Recommended OTC corn and callus remover.  6. Patient is to return to the clinic as needed.   Felecia Shelling, DPM [facility]  Dr. Felecia Shelling, DPM    [address]                                        [place], [place] [address]                Office [phone]  Fax [phone]

## 2019-08-24 DIAGNOSIS — R260 Ataxic gait: Secondary | ICD-10-CM | POA: Diagnosis not present

## 2019-08-29 DIAGNOSIS — R260 Ataxic gait: Secondary | ICD-10-CM | POA: Diagnosis not present

## 2019-08-31 DIAGNOSIS — R260 Ataxic gait: Secondary | ICD-10-CM | POA: Diagnosis not present

## 2019-08-31 DIAGNOSIS — R269 Unspecified abnormalities of gait and mobility: Secondary | ICD-10-CM | POA: Diagnosis not present

## 2019-08-31 DIAGNOSIS — G20 Parkinson's disease: Secondary | ICD-10-CM | POA: Diagnosis not present

## 2019-09-11 ENCOUNTER — Other Ambulatory Visit: Payer: Self-pay

## 2019-09-11 ENCOUNTER — Ambulatory Visit: Payer: PPO | Admitting: Neurology

## 2019-09-11 ENCOUNTER — Encounter: Payer: Self-pay | Admitting: Neurology

## 2019-09-11 VITALS — BP 106/54 | HR 64 | Temp 97.3°F | Ht 71.0 in | Wt 168.3 lb

## 2019-09-11 DIAGNOSIS — G20 Parkinson's disease: Secondary | ICD-10-CM | POA: Diagnosis not present

## 2019-09-11 MED ORDER — CARBIDOPA-LEVODOPA 25-100 MG PO TABS: ORAL_TABLET | ORAL | 1 refills | Status: DC

## 2019-09-11 NOTE — Progress Notes (Signed)
Reason for visit: Parkinson's disease  Donald [name] is an 79 y.o. male  History of present illness:  Donald [name] is a 79 year old left-handed white male with a history of Parkinson's disease.  The patient currently is in physical therapy, he has been falling quite a bit prior to this.  The physical therapist recommended getting a cane which she has done.  He also has gotten a lift chair at home.  The patient believes that he has gotten good benefit with the therapy and wants to know if the therapy can be continued.  He has gone off of the Artane completely.  His tremors have not been significant.  The patient in general is sleeping fairly well, occasionally he will talk in his sleep.  He is not having any hallucinations.  The patient remains on Sinemet 25/100 mg tablets, 1/2 tablet on the first and third doses of the Sinemet 25/250 milligrams tablets taking 1 tablet 4 times daily with Comtan.  The patient remains on Requip 2 mg 3 times daily.  The patient remains quite hard of hearing.  He does have some troubles with swallowing, but this has not worsened over time.  Past Medical History:  Diagnosis Date  . Abnormality of gait 08/27/2014  . Anxiety   . Aortic stenosis    s/p AVR by Dr Laneta Simmers  . Arthritis    left wrist- probable- not diagonised  . Asthma    in the past  . CAD (coronary artery disease)    s/p CABG 2006  . GERD (gastroesophageal reflux disease)   . H/O seasonal allergies   . H/O: rheumatic fever   . Hearing deficit    Bilateral hearing aids  . Hiatal hernia   . History of kidney stones   . HOH (hard of hearing)   . Hyperlipidemia   . Hypertension   . Hyperthyroidism   . Memory difficulty 08/27/2014  . Pacemaker   . Parkinson's disease   . Persistent atrial fibrillation (HCC)   . Scarlet fever    as a child  . Shortness of breath   . Sick sinus syndrome (HCC)    s/p PPM (MDT) 2006    Past Surgical History:  Procedure Laterality Date  . AORTIC VALVE  REPLACEMENT  2006  . CARDIOVERSION N/A 09/14/2013   Procedure: CARDIOVERSION;  Surgeon: Thurmon Fair, MD;  Location: [facility];  Service: Cardiovascular;  Laterality: N/A;  . CHOLECYSTECTOMY N/A 07/21/2013   Procedure: LAPAROSCOPIC CHOLECYSTECTOMY;  Surgeon: Mariella Saa, MD;  Location: [facility] OR;  Service: General;  Laterality: N/A;  . COLONOSCOPY W/ POLYPECTOMY    . COLONOSCOPY WITH PROPOFOL N/A 08/31/2016   Procedure: COLONOSCOPY WITH PROPOFOL;  Surgeon: Charlott Rakes, MD;  Location: [facility] ENDOSCOPY;  Service: Endoscopy;  Laterality: N/A;  . CORONARY ARTERY BYPASS GRAFT  2006  . DENTAL SURGERY     implants  . INSERT / REPLACE / REMOVE PACEMAKER  2006, 2012   MDT for sick sinus syndrome, gen change 8/12 by JA  . KIDNEY STONE SURGERY  1984    Family History  Problem Relation Age of Onset  . Dementia Mother   . Parkinsonism Mother   . Heart disease Father   . Heart disease Sister   . Heart disease Brother   . Tremor Brother        Unilateral tremor  . Coronary artery disease Unknown        family hx of    Social history:  reports that  he has never smoked. He has never used smokeless tobacco. He reports that he does not drink alcohol or use drugs.    Allergies  Allergen Reactions  . Sulfa Antibiotics     unknown    Medications:  Prior to Admission medications   Medication Sig Start Date End Date Taking? Authorizing Provider  aspirin 81 MG tablet Take 81 mg by mouth daily.     Yes [provider]  atorvastatin (LIPITOR) 20 MG tablet Take 1 tablet (20 mg total) by mouth daily. 08/17/19  Yes Allred, Fayrene Fearing, MD  carbidopa-levodopa (SINEMET) 25-100 MG tablet 1/2 tablet with a first and third doses of the Sinemet 25/250 mg tablets 08/02/19  Yes York Spaniel, MD  carbidopa-levodopa (SINEMET) 25-250 MG tablet Take 1 tablet by mouth 4 (four) times daily. 03/30/19  Yes York Spaniel, MD  entacapone (COMTAN) 200 MG tablet Take 1 tablet (200 mg total) by mouth 4  (four) times daily. 03/30/19  Yes York Spaniel, MD  esomeprazole (NEXIUM) 20 MG capsule Take 20 mg by mouth daily at 12 noon.   Yes [provider]  levothyroxine (SYNTHROID, LEVOTHROID) 125 MCG tablet Take 125 mcg by mouth daily.     Yes [provider]  metoprolol succinate (TOPROL-XL) 25 MG 24 hr tablet Take 1 tablet (25 mg total) by mouth daily. 07/27/19  Yes Allred, Fayrene Fearing, MD  nitroGLYCERIN (NITROSTAT) 0.4 MG SL tablet PLACE 1 TABLET UNDER TONGUE EVERY 5 MINUTES FOR 3 DOSES AS NEEDED FOR CHEST PAIN 05/22/19  Yes Rosalio Macadamia, NP  rOPINIRole (REQUIP) 2 MG tablet Take 1 tablet (2 mg total) by mouth 3 (three) times daily. 11/25/18  Yes York Spaniel, MD  UNABLE TO FIND Med Name: Hemp oil   Yes [provider]  warfarin (COUMADIN) 5 MG tablet Take 1/2 to 1 tablet daily or as directed by Coumadin clinic. 03/20/19  Yes Nahser, Deloris Ping, MD    ROS:  Out of a complete 14 system review of symptoms, the patient complains only of the following symptoms, and all other reviewed systems are negative.  Swallowing difficulty Walking difficulty Tremors Hard of hearing  Blood pressure (!) 106/54, pulse 64, temperature (!) 97.3 F (36.3 C), temperature source Temporal, height 5\' 11"  (1.803 m), weight 168 lb 5 oz (76.3 kg), SpO2 97 %.  Physical Exam  General: The patient is alert and cooperative at the time of the examination.  Skin: No significant peripheral edema is noted.   Neurologic Exam  Mental status: The patient is alert and oriented x 3 at the time of the examination. The patient has apparent normal recent and remote memory, with an apparently normal attention span and concentration ability.   Cranial nerves: Facial symmetry is present. Speech is somewhat hypophonic and slightly dysarthric, not aphasic. Extraocular movements are full. Visual fields are full.  Masking the face is seen.  Motor: The patient has good strength in all 4  extremities.  Sensory examination: Soft touch sensation is symmetric on the face, arms, and legs.  Coordination: The patient has good finger-nose-finger and heel-to-shin bilaterally.  Patient has some apraxia with use of the lower extremities.  Gait and station: The patient has some difficulty arising from a seated position with arms crossed.  Once up, he can walk independently, he has a cane for ambulation.  He has a shuffling type gait pattern, slowness with turns.  The posture is stooped.  He is able to walk independently.  Tandem gait was  not attempted.  Romberg is negative, but the patient does lean backwards slightly.  Reflexes: Deep tendon reflexes are symmetric.   Assessment/Plan:  1.  Parkinson's disease  2.  Gait disturbance  The patient will continue on his current medications, a prescription was given for the 25/100 mg Sinemet tablet.  He will follow-up here in 4 months.  He will complete his physical therapy and use the cane when he is outside of the house.  He is on anticoagulation therapy, prevention of falls is important.  He has gotten a biker's helmet to wear when he is out walking, he continues to walk 1.25 miles daily.  He is thinking about getting back into the [employer] program at the [facility].  He has gotten his vaccination for the Covid virus.  He will follow-up here in 4 months.  Jill Alexanders MD 09/11/2019 7:43 AM  [facility] [address] [place], [place] [address]  Phone [phone] Fax [phone]

## 2019-09-13 ENCOUNTER — Ambulatory Visit (INDEPENDENT_AMBULATORY_CARE_PROVIDER_SITE_OTHER): Payer: PPO | Admitting: *Deleted

## 2019-09-13 ENCOUNTER — Other Ambulatory Visit: Payer: Self-pay

## 2019-09-13 DIAGNOSIS — I350 Nonrheumatic aortic (valve) stenosis: Secondary | ICD-10-CM

## 2019-09-13 DIAGNOSIS — I359 Nonrheumatic aortic valve disorder, unspecified: Secondary | ICD-10-CM | POA: Diagnosis not present

## 2019-09-13 DIAGNOSIS — Z5181 Encounter for therapeutic drug level monitoring: Secondary | ICD-10-CM | POA: Diagnosis not present

## 2019-09-13 LAB — POCT INR: INR: 2 (ref 2.0–3.0)

## 2019-09-13 NOTE — Patient Instructions (Signed)
Description   Today take 1.5 tablets then continue on same dosage 1 tablet daily except 1/2 tablet on Sundays, Tuesdays and Thursdays. Recheck INR in 4 weeks. Call [phone] call with any new medications or if scheduled for any other procedures.

## 2019-09-14 DIAGNOSIS — Z961 Presence of intraocular lens: Secondary | ICD-10-CM | POA: Diagnosis not present

## 2019-09-18 DIAGNOSIS — R260 Ataxic gait: Secondary | ICD-10-CM | POA: Diagnosis not present

## 2019-09-22 ENCOUNTER — Ambulatory Visit (INDEPENDENT_AMBULATORY_CARE_PROVIDER_SITE_OTHER): Payer: PPO | Admitting: *Deleted

## 2019-09-22 DIAGNOSIS — I495 Sick sinus syndrome: Secondary | ICD-10-CM

## 2019-09-22 DIAGNOSIS — I119 Hypertensive heart disease without heart failure: Secondary | ICD-10-CM | POA: Diagnosis not present

## 2019-09-22 DIAGNOSIS — K4090 Unilateral inguinal hernia, without obstruction or gangrene, not specified as recurrent: Secondary | ICD-10-CM | POA: Diagnosis not present

## 2019-09-22 NOTE — Progress Notes (Signed)
Remote pacemaker transmission.   

## 2019-09-25 LAB — CUP PACEART REMOTE DEVICE CHECK
Battery Impedance: 1211 Ohm
Battery Remaining Longevity: 56 mo
Battery Voltage: 2.76 V
Brady Statistic RV Percent Paced: 47 %
Date Time Interrogation Session: 20210507101200
Implantable Lead Implant Date: 20060326
Implantable Lead Implant Date: 20060326
Implantable Lead Location: 753859
Implantable Lead Location: 753860
Implantable Lead Model: 5076
Implantable Lead Model: 5076
Implantable Pulse Generator Implant Date: 20120817
Lead Channel Impedance Value: 374 Ohm
Lead Channel Impedance Value: 67 Ohm
Lead Channel Pacing Threshold Amplitude: 0.5 V
Lead Channel Pacing Threshold Pulse Width: 0.4 ms
Lead Channel Setting Pacing Amplitude: 2.5 V
Lead Channel Setting Pacing Pulse Width: 0.64 ms
Lead Channel Setting Sensing Sensitivity: 5.6 mV

## 2019-09-26 DIAGNOSIS — R260 Ataxic gait: Secondary | ICD-10-CM | POA: Diagnosis not present

## 2019-09-29 DIAGNOSIS — R260 Ataxic gait: Secondary | ICD-10-CM | POA: Diagnosis not present

## 2019-10-03 ENCOUNTER — Other Ambulatory Visit: Payer: Self-pay | Admitting: Neurology

## 2019-10-03 DIAGNOSIS — R260 Ataxic gait: Secondary | ICD-10-CM | POA: Diagnosis not present

## 2019-10-05 DIAGNOSIS — R260 Ataxic gait: Secondary | ICD-10-CM | POA: Diagnosis not present

## 2019-10-09 DIAGNOSIS — R260 Ataxic gait: Secondary | ICD-10-CM | POA: Diagnosis not present

## 2019-10-11 ENCOUNTER — Ambulatory Visit (INDEPENDENT_AMBULATORY_CARE_PROVIDER_SITE_OTHER): Payer: PPO | Admitting: Pharmacist

## 2019-10-11 ENCOUNTER — Other Ambulatory Visit: Payer: Self-pay

## 2019-10-11 DIAGNOSIS — I350 Nonrheumatic aortic (valve) stenosis: Secondary | ICD-10-CM

## 2019-10-11 DIAGNOSIS — Z5181 Encounter for therapeutic drug level monitoring: Secondary | ICD-10-CM | POA: Diagnosis not present

## 2019-10-11 DIAGNOSIS — I359 Nonrheumatic aortic valve disorder, unspecified: Secondary | ICD-10-CM

## 2019-10-11 DIAGNOSIS — R260 Ataxic gait: Secondary | ICD-10-CM | POA: Diagnosis not present

## 2019-10-11 LAB — POCT INR: INR: 2.5 (ref 2.0–3.0)

## 2019-10-11 NOTE — Patient Instructions (Signed)
Continue on same dosage 1 tablet daily except 1/2 tablet on Sundays, Tuesdays and Thursdays. Recheck INR in 4 weeks. Call [phone] call with any new medications or if scheduled for any other procedures

## 2019-10-17 DIAGNOSIS — R260 Ataxic gait: Secondary | ICD-10-CM | POA: Diagnosis not present

## 2019-10-18 ENCOUNTER — Other Ambulatory Visit: Payer: Self-pay | Admitting: Nurse Practitioner

## 2019-10-19 DIAGNOSIS — R300 Dysuria: Secondary | ICD-10-CM | POA: Diagnosis not present

## 2019-10-19 DIAGNOSIS — N390 Urinary tract infection, site not specified: Secondary | ICD-10-CM | POA: Diagnosis not present

## 2019-10-19 DIAGNOSIS — Z954 Presence of other heart-valve replacement: Secondary | ICD-10-CM | POA: Diagnosis not present

## 2019-10-19 DIAGNOSIS — K4090 Unilateral inguinal hernia, without obstruction or gangrene, not specified as recurrent: Secondary | ICD-10-CM | POA: Diagnosis not present

## 2019-10-19 DIAGNOSIS — Z7901 Long term (current) use of anticoagulants: Secondary | ICD-10-CM | POA: Diagnosis not present

## 2019-10-19 DIAGNOSIS — R296 Repeated falls: Secondary | ICD-10-CM | POA: Diagnosis not present

## 2019-10-19 DIAGNOSIS — G20 Parkinson's disease: Secondary | ICD-10-CM | POA: Diagnosis not present

## 2019-10-20 ENCOUNTER — Telehealth: Payer: Self-pay | Admitting: Pharmacist

## 2019-10-20 DIAGNOSIS — I119 Hypertensive heart disease without heart failure: Secondary | ICD-10-CM | POA: Diagnosis not present

## 2019-10-20 DIAGNOSIS — R260 Ataxic gait: Secondary | ICD-10-CM | POA: Diagnosis not present

## 2019-10-20 DIAGNOSIS — I4821 Permanent atrial fibrillation: Secondary | ICD-10-CM | POA: Diagnosis not present

## 2019-10-20 DIAGNOSIS — R310 Gross hematuria: Secondary | ICD-10-CM | POA: Diagnosis not present

## 2019-10-20 DIAGNOSIS — Z952 Presence of prosthetic heart valve: Secondary | ICD-10-CM | POA: Diagnosis not present

## 2019-10-20 DIAGNOSIS — Z7901 Long term (current) use of anticoagulants: Secondary | ICD-10-CM | POA: Diagnosis not present

## 2019-10-20 NOTE — Telephone Encounter (Signed)
Received phone call from NP from [facility] medical about patient. Having gross hematuria for 2 weeks. HGb is stable. INR today is 2.2

## 2019-10-24 DIAGNOSIS — R260 Ataxic gait: Secondary | ICD-10-CM | POA: Diagnosis not present

## 2019-10-26 DIAGNOSIS — R260 Ataxic gait: Secondary | ICD-10-CM | POA: Diagnosis not present

## 2019-10-31 DIAGNOSIS — R260 Ataxic gait: Secondary | ICD-10-CM | POA: Diagnosis not present

## 2019-11-02 DIAGNOSIS — R260 Ataxic gait: Secondary | ICD-10-CM | POA: Diagnosis not present

## 2019-11-07 DIAGNOSIS — R260 Ataxic gait: Secondary | ICD-10-CM | POA: Diagnosis not present

## 2019-11-08 ENCOUNTER — Ambulatory Visit (INDEPENDENT_AMBULATORY_CARE_PROVIDER_SITE_OTHER): Payer: PPO

## 2019-11-08 ENCOUNTER — Other Ambulatory Visit: Payer: Self-pay

## 2019-11-08 DIAGNOSIS — I359 Nonrheumatic aortic valve disorder, unspecified: Secondary | ICD-10-CM

## 2019-11-08 DIAGNOSIS — I350 Nonrheumatic aortic (valve) stenosis: Secondary | ICD-10-CM | POA: Diagnosis not present

## 2019-11-08 DIAGNOSIS — Z5181 Encounter for therapeutic drug level monitoring: Secondary | ICD-10-CM

## 2019-11-08 LAB — POCT INR: INR: 2.8 (ref 2.0–3.0)

## 2019-11-08 NOTE — Patient Instructions (Signed)
Description   Continue on same dosage 1 tablet daily except 1/2 tablet on Sundays, Tuesdays and Thursdays. Recheck INR in 4 weeks. Call [phone] call with any new medications or if scheduled for any other procedures.

## 2019-11-09 DIAGNOSIS — R260 Ataxic gait: Secondary | ICD-10-CM | POA: Diagnosis not present

## 2019-11-14 DIAGNOSIS — R260 Ataxic gait: Secondary | ICD-10-CM | POA: Diagnosis not present

## 2019-11-22 DIAGNOSIS — R260 Ataxic gait: Secondary | ICD-10-CM | POA: Diagnosis not present

## 2019-11-23 ENCOUNTER — Other Ambulatory Visit: Payer: Self-pay | Admitting: *Deleted

## 2019-11-23 MED ORDER — WARFARIN SODIUM 5 MG PO TABS: ORAL_TABLET | ORAL | 0 refills | Status: DC

## 2019-11-24 DIAGNOSIS — R260 Ataxic gait: Secondary | ICD-10-CM | POA: Diagnosis not present

## 2019-11-30 DIAGNOSIS — R260 Ataxic gait: Secondary | ICD-10-CM | POA: Diagnosis not present

## 2019-12-05 DIAGNOSIS — R260 Ataxic gait: Secondary | ICD-10-CM | POA: Diagnosis not present

## 2019-12-06 ENCOUNTER — Ambulatory Visit (INDEPENDENT_AMBULATORY_CARE_PROVIDER_SITE_OTHER): Payer: PPO | Admitting: *Deleted

## 2019-12-06 ENCOUNTER — Other Ambulatory Visit: Payer: Self-pay

## 2019-12-06 DIAGNOSIS — I350 Nonrheumatic aortic (valve) stenosis: Secondary | ICD-10-CM | POA: Diagnosis not present

## 2019-12-06 DIAGNOSIS — Z5181 Encounter for therapeutic drug level monitoring: Secondary | ICD-10-CM | POA: Diagnosis not present

## 2019-12-06 DIAGNOSIS — I359 Nonrheumatic aortic valve disorder, unspecified: Secondary | ICD-10-CM

## 2019-12-06 LAB — POCT INR: INR: 3.2 — AB (ref 2.0–3.0)

## 2019-12-06 NOTE — Patient Instructions (Signed)
Description   Continue on same dosage 1 tablet daily except 1/2 tablet on Sundays, Tuesdays and Thursdays. Recheck INR in 5 weeks. Call [phone] call with any new medications or if scheduled for any other procedures.

## 2019-12-07 DIAGNOSIS — R260 Ataxic gait: Secondary | ICD-10-CM | POA: Diagnosis not present

## 2019-12-18 DIAGNOSIS — R310 Gross hematuria: Secondary | ICD-10-CM | POA: Diagnosis not present

## 2019-12-19 DIAGNOSIS — R260 Ataxic gait: Secondary | ICD-10-CM | POA: Diagnosis not present

## 2019-12-22 ENCOUNTER — Ambulatory Visit (INDEPENDENT_AMBULATORY_CARE_PROVIDER_SITE_OTHER): Payer: PPO | Admitting: *Deleted

## 2019-12-22 DIAGNOSIS — I495 Sick sinus syndrome: Secondary | ICD-10-CM

## 2019-12-22 LAB — CUP PACEART REMOTE DEVICE CHECK
Battery Impedance: 1261 Ohm
Battery Remaining Longevity: 54 mo
Battery Voltage: 2.76 V
Brady Statistic RV Percent Paced: 44 %
Date Time Interrogation Session: 20210806085926
Implantable Lead Implant Date: 20060326
Implantable Lead Implant Date: 20060326
Implantable Lead Location: 753859
Implantable Lead Location: 753860
Implantable Lead Model: 5076
Implantable Lead Model: 5076
Implantable Pulse Generator Implant Date: 20120817
Lead Channel Impedance Value: 423 Ohm
Lead Channel Impedance Value: 67 Ohm
Lead Channel Pacing Threshold Amplitude: 1.75 V
Lead Channel Pacing Threshold Pulse Width: 0.4 ms
Lead Channel Setting Pacing Amplitude: 3.5 V
Lead Channel Setting Pacing Pulse Width: 0.4 ms
Lead Channel Setting Sensing Sensitivity: 5.6 mV

## 2019-12-25 NOTE — Progress Notes (Signed)
Remote pacemaker transmission.   

## 2020-01-08 ENCOUNTER — Telehealth: Payer: Self-pay | Admitting: Neurology

## 2020-01-08 DIAGNOSIS — N400 Enlarged prostate without lower urinary tract symptoms: Secondary | ICD-10-CM | POA: Diagnosis not present

## 2020-01-08 DIAGNOSIS — N433 Hydrocele, unspecified: Secondary | ICD-10-CM | POA: Diagnosis not present

## 2020-01-08 DIAGNOSIS — R310 Gross hematuria: Secondary | ICD-10-CM | POA: Diagnosis not present

## 2020-01-08 DIAGNOSIS — N200 Calculus of kidney: Secondary | ICD-10-CM | POA: Diagnosis not present

## 2020-01-08 DIAGNOSIS — N281 Cyst of kidney, acquired: Secondary | ICD-10-CM | POA: Diagnosis not present

## 2020-01-08 NOTE — Telephone Encounter (Signed)
Pt's wife, Dequavion Follette called Pt having fallen a lot in the last month, has several falls a day. Suggestions on what we could do until pt appt on 9/2. Would like a call from the nurse

## 2020-01-08 NOTE — Telephone Encounter (Signed)
Called the wife back and she states that the patient has been having frequent falls that are occurring. These falls are causing the patient minor injuries however she is worried that potentially something may worsen. I asked if the patient has worked with PT and she states that he had and that the PT recommended he complete the [employer], which he is doing. She states that initially she thought he was doing better but recently the falls are happening frequently. She is asking if there is anything we can do until he has his follow up 01/18/2020. I advised that Dr Anne Hahn is out this week but that I would bring this to our work in MD attention and see if she has recommendations. She verbalized understanding.

## 2020-01-08 NOTE — Telephone Encounter (Signed)
He is on anticoagulation therapy, prevention of falls is important.   He has gotten a biker's helmet to wear when he is out walking, he continues to walk 1.25 miles daily.   He is thinking about getting back into the [employer] program at the [facility].  He has gotten his vaccination for the Covid virus.  I recommend to increase the dose of sinemet medication to 4 times daily 25/250 plus a half tab of sinemet 25/100- each dose . Melvyn Novas, MD  Cc Dr Anne Hahn to approve and follow up.

## 2020-01-09 NOTE — Telephone Encounter (Signed)
I called and left a message.  Going up on the Sinemet may help some but I am not sure it will correct all of the balance issues.  I would recommend getting physical therapy working on safety training with walking.  If they are amenable to this, they are to call let us know and I will get this set up.

## 2020-01-10 ENCOUNTER — Other Ambulatory Visit: Payer: Self-pay

## 2020-01-10 ENCOUNTER — Ambulatory Visit (INDEPENDENT_AMBULATORY_CARE_PROVIDER_SITE_OTHER): Payer: PPO | Admitting: *Deleted

## 2020-01-10 DIAGNOSIS — Z5181 Encounter for therapeutic drug level monitoring: Secondary | ICD-10-CM | POA: Diagnosis not present

## 2020-01-10 DIAGNOSIS — I359 Nonrheumatic aortic valve disorder, unspecified: Secondary | ICD-10-CM

## 2020-01-10 DIAGNOSIS — I350 Nonrheumatic aortic (valve) stenosis: Secondary | ICD-10-CM

## 2020-01-10 LAB — POCT INR: INR: 3.5 — AB (ref 2.0–3.0)

## 2020-01-10 NOTE — Patient Instructions (Signed)
Description   Continue on same dosage 1 tablet daily except 1/2 tablet on Sundays, Tuesdays and Thursdays. Recheck INR in 6 weeks. Call [phone] call with any new medications or if scheduled for any other procedures.

## 2020-01-16 DIAGNOSIS — R3915 Urgency of urination: Secondary | ICD-10-CM | POA: Diagnosis not present

## 2020-01-18 ENCOUNTER — Encounter: Payer: Self-pay | Admitting: Neurology

## 2020-01-18 ENCOUNTER — Ambulatory Visit: Payer: PPO | Admitting: Neurology

## 2020-01-18 VITALS — BP 122/66 | HR 59 | Ht 67.0 in | Wt 154.0 lb

## 2020-01-18 DIAGNOSIS — G20 Parkinson's disease: Secondary | ICD-10-CM

## 2020-01-18 DIAGNOSIS — R269 Unspecified abnormalities of gait and mobility: Secondary | ICD-10-CM | POA: Diagnosis not present

## 2020-01-18 DIAGNOSIS — R413 Other amnesia: Secondary | ICD-10-CM | POA: Diagnosis not present

## 2020-01-18 MED ORDER — CARBIDOPA-LEVODOPA 25-100 MG PO TABS: ORAL_TABLET | ORAL | 1 refills | Status: DC

## 2020-01-18 NOTE — Progress Notes (Signed)
Reason for visit: Parkinson's disease  Donald [name] is an 79 y.o. male  History of present illness:  Donald [name] is a 79 year old left-handed white male with a history of Parkinson's disease.  The patient continues to have some issues with gait instability and multiple falls.  He likes to work out in the yard and generally he will fall when outside the house, not inside.  He has been seen 4 and half years ago by Dr. Arbutus Leas for evaluation for deep brain stimulator and was not felt to be a candidate for this.  The patient is off of Artane at this point, he has had some troubles with memory.  He is very hard of hearing.  He has just recently finished physical therapy in July 2021, he is now in rock steady boxing.  He has not had any confusion or hallucinations.  He is on Coumadin therapy and therefore wears a biking helmet when he is walking.  The patient uses a cane for ambulation, he has refused so far to use a walker.  Around 7 PM, he has developed some dyskinesias from the Sinemet.  Past Medical History:  Diagnosis Date  . Abnormality of gait 08/27/2014  . Anxiety   . Aortic stenosis    s/p AVR by Dr Laneta Simmers  . Arthritis    left wrist- probable- not diagonised  . Asthma    in the past  . CAD (coronary artery disease)    s/p CABG 2006  . GERD (gastroesophageal reflux disease)   . H/O seasonal allergies   . H/O: rheumatic fever   . Hearing deficit    Bilateral hearing aids  . Hematuria    had CT scan  . Hiatal hernia   . History of kidney stones   . HOH (hard of hearing)   . Hyperlipidemia   . Hypertension   . Hyperthyroidism   . Inguinal hernia    right side; watching at this time per wife's report  . Memory difficulty 08/27/2014  . Pacemaker   . Parkinson's disease   . Persistent atrial fibrillation (HCC)   . Scarlet fever    as a child  . Shortness of breath   . Sick sinus syndrome (HCC)    s/p PPM (MDT) 2006    Past Surgical History:  Procedure Laterality Date  .  AORTIC VALVE REPLACEMENT  2006  . CARDIOVERSION N/A 09/14/2013   Procedure: CARDIOVERSION;  Surgeon: Thurmon Fair, MD;  Location: [facility] ENDOSCOPY;  Service: Cardiovascular;  Laterality: N/A;  . CHOLECYSTECTOMY N/A 07/21/2013   Procedure: LAPAROSCOPIC CHOLECYSTECTOMY;  Surgeon: Mariella Saa, MD;  Location: [facility] OR;  Service: General;  Laterality: N/A;  . COLONOSCOPY W/ POLYPECTOMY    . COLONOSCOPY WITH PROPOFOL N/A 08/31/2016   Procedure: COLONOSCOPY WITH PROPOFOL;  Surgeon: Charlott Rakes, MD;  Location: [facility] ENDOSCOPY;  Service: Endoscopy;  Laterality: N/A;  . CORONARY ARTERY BYPASS GRAFT  2006  . DENTAL SURGERY     implants  . INSERT / REPLACE / REMOVE PACEMAKER  2006, 2012   MDT for sick sinus syndrome, gen change 8/12 by JA  . KIDNEY STONE SURGERY  1984    Family History  Problem Relation Age of Onset  . Dementia Mother   . Parkinsonism Mother   . Heart disease Father   . Heart disease Sister   . Heart disease Brother   . Tremor Brother        Unilateral tremor  . Coronary artery disease Other  family hx of    Social history:  reports that he has never smoked. He has never used smokeless tobacco. He reports that he does not drink alcohol and does not use drugs.    Allergies  Allergen Reactions  . Sulfa Antibiotics     unknown    Medications:  Prior to Admission medications   Medication Sig Start Date End Date Taking? Authorizing Provider  aspirin 81 MG tablet Take 81 mg by mouth daily.     Yes [provider]  atorvastatin (LIPITOR) 20 MG tablet Take 1 tablet (20 mg total) by mouth daily. 08/17/19  Yes Allred, Fayrene Fearing, MD  carbidopa-levodopa (SINEMET) 25-100 MG tablet 1/2 tablet with a first and third doses of the Sinemet 25/250 mg tablets Patient taking differently: 1/2 tablet four times daily with Carbidopa-Levodopa 25-250 09/11/19  Yes York Spaniel, MD  carbidopa-levodopa (SINEMET) 25-250 MG tablet Take 1 tablet by mouth 4 (four) times daily.  03/30/19  Yes York Spaniel, MD  entacapone (COMTAN) 200 MG tablet Take 1 tablet (200 mg total) by mouth 4 (four) times daily. 03/30/19  Yes York Spaniel, MD  esomeprazole (NEXIUM) 20 MG capsule Take 20 mg by mouth daily at 12 noon.   Yes [provider]  levothyroxine (SYNTHROID, LEVOTHROID) 125 MCG tablet Take 125 mcg by mouth daily.     Yes [provider]  metoprolol succinate (TOPROL-XL) 25 MG 24 hr tablet Take 1 tablet (25 mg total) by mouth daily. 07/27/19  Yes Allred, Fayrene Fearing, MD  mirabegron ER (MYRBETRIQ) 25 MG TB24 tablet Take 25 mg by mouth daily.   Yes [provider]  nitroGLYCERIN (NITROSTAT) 0.4 MG SL tablet PLACE 1 TABLET UNDER TONGUE EVERY 5 MINUTES FOR 3 DOSES AS NEEDED FOR CHEST PAIN 10/18/19  Yes Rosalio Macadamia, NP  rOPINIRole (REQUIP) 2 MG tablet Take 1 tablet by mouth 3 times a day 10/03/19  Yes York Spaniel, MD  UNABLE TO FIND Med Name: Hemp oil   Yes [provider]  warfarin (COUMADIN) 5 MG tablet Take 1/2 to 1 tablet daily or as directed by Coumadin clinic. 11/23/19  Yes Nahser, Deloris Ping, MD    ROS:  Out of a complete 14 system review of symptoms, the patient complains only of the following symptoms, and all other reviewed systems are negative.  Walking problems Hard of hearing Memory problems  Blood pressure 122/66, pulse (!) 59, height 5\' 7"  (1.702 m), weight 154 lb (69.9 kg).  Physical Exam  General: The patient is alert and cooperative at the time of the examination.  Skin: No significant peripheral edema is noted.   Neurologic Exam  Mental status: The patient is alert and oriented x 3 at the time of the examination. The patient has apparent normal recent and remote memory, with an apparently normal attention span and concentration ability.   Cranial nerves: Facial symmetry is present. Speech is normal, no aphasia or dysarthria is noted. Extraocular movements are full. Visual fields are full.  Masking the face  is seen.  The patient is very hard of hearing.  Motor: The patient has good strength in all 4 extremities.  Sensory examination: Soft touch sensation is symmetric on the face, arms, and legs.  Coordination: The patient has good finger-nose-finger and heel-to-shin bilaterally.  Gait and station: The patient has a slow, shuffling gait.  The patient normally walks with a walker.  Tandem gait is not attempted.  Romberg is negative.  Reflexes: Deep tendon reflexes  are symmetric.   Assessment/Plan:  1.  Parkinson's disease  2.  Gait disturbance, multiple falls  3.  Mild memory disturbance  The patient is trying to stay active doing yard work but he is at risk for falling.  He is wearing a helmet to protect his head.  The patient is having dyskinesias around 7 PM in the evening, he will back off on the half Sinemet tablet at the 6 PM dose.  He will follow-up here in 5 months.  Marlan Palau MD 01/18/2020 1:11 PM  [facility] [address] [place], [place] [address]  Phone [phone] Fax [phone]

## 2020-01-25 ENCOUNTER — Other Ambulatory Visit: Payer: Self-pay

## 2020-01-25 MED ORDER — WARFARIN SODIUM 5 MG PO TABS: ORAL_TABLET | ORAL | 1 refills | Status: DC

## 2020-01-29 ENCOUNTER — Telehealth: Payer: Self-pay | Admitting: Neurology

## 2020-01-29 NOTE — Telephone Encounter (Signed)
[name],Donald(wife) is asking for a call to confirm if the form for travel insurance has been received and to have an idea as to when it will be completed, please call.

## 2020-02-05 ENCOUNTER — Telehealth: Payer: Self-pay | Admitting: Neurology

## 2020-02-05 NOTE — Telephone Encounter (Signed)
Pt's wife, Kojo Liby called Pt having issues walking, moving around, a lot of falls. Could it be his medication. Ms. Reede would like a call to discuss if he could be worked in to Dr. Anne Hahn' schedule next week.

## 2020-02-05 NOTE — Telephone Encounter (Signed)
I called the pt and advised his wife ( ok per dpr). Dr. Anne Hahn could see the pt on 02/15/2020 at 12 pm check in time of 1130. She was agreeable and appt has been made.

## 2020-02-06 DIAGNOSIS — E039 Hypothyroidism, unspecified: Secondary | ICD-10-CM | POA: Diagnosis not present

## 2020-02-06 DIAGNOSIS — D6869 Other thrombophilia: Secondary | ICD-10-CM | POA: Diagnosis not present

## 2020-02-06 DIAGNOSIS — Z950 Presence of cardiac pacemaker: Secondary | ICD-10-CM | POA: Diagnosis not present

## 2020-02-06 DIAGNOSIS — Z7901 Long term (current) use of anticoagulants: Secondary | ICD-10-CM | POA: Diagnosis not present

## 2020-02-06 DIAGNOSIS — I4821 Permanent atrial fibrillation: Secondary | ICD-10-CM | POA: Diagnosis not present

## 2020-02-06 DIAGNOSIS — I119 Hypertensive heart disease without heart failure: Secondary | ICD-10-CM | POA: Diagnosis not present

## 2020-02-06 DIAGNOSIS — Z952 Presence of prosthetic heart valve: Secondary | ICD-10-CM | POA: Diagnosis not present

## 2020-02-06 DIAGNOSIS — E7800 Pure hypercholesterolemia, unspecified: Secondary | ICD-10-CM | POA: Diagnosis not present

## 2020-02-06 DIAGNOSIS — D692 Other nonthrombocytopenic purpura: Secondary | ICD-10-CM | POA: Diagnosis not present

## 2020-02-06 DIAGNOSIS — G20 Parkinson's disease: Secondary | ICD-10-CM | POA: Diagnosis not present

## 2020-02-06 DIAGNOSIS — H900 Conductive hearing loss, bilateral: Secondary | ICD-10-CM | POA: Diagnosis not present

## 2020-02-06 DIAGNOSIS — I25810 Atherosclerosis of coronary artery bypass graft(s) without angina pectoris: Secondary | ICD-10-CM | POA: Diagnosis not present

## 2020-02-15 ENCOUNTER — Ambulatory Visit: Payer: Self-pay | Admitting: Neurology

## 2020-02-19 ENCOUNTER — Other Ambulatory Visit: Payer: Self-pay | Admitting: Emergency Medicine

## 2020-02-19 MED ORDER — CARBIDOPA-LEVODOPA 25-100 MG PO TABS: ORAL_TABLET | ORAL | 1 refills | Status: DC

## 2020-02-19 MED ORDER — ENTACAPONE 200 MG PO TABS: 200.0000 mg | ORAL_TABLET | Freq: Four times a day (QID) | ORAL | 3 refills | Status: DC

## 2020-02-19 NOTE — Telephone Encounter (Signed)
Refills sent in for Carbidopa-Levodopa 25-250 and Entacapone for 6 months.  Notified will need follow up in 5 months.

## 2020-02-21 ENCOUNTER — Ambulatory Visit (INDEPENDENT_AMBULATORY_CARE_PROVIDER_SITE_OTHER): Payer: PPO | Admitting: *Deleted

## 2020-02-21 ENCOUNTER — Other Ambulatory Visit: Payer: Self-pay

## 2020-02-21 DIAGNOSIS — I350 Nonrheumatic aortic (valve) stenosis: Secondary | ICD-10-CM | POA: Diagnosis not present

## 2020-02-21 DIAGNOSIS — I359 Nonrheumatic aortic valve disorder, unspecified: Secondary | ICD-10-CM | POA: Diagnosis not present

## 2020-02-21 DIAGNOSIS — Z5181 Encounter for therapeutic drug level monitoring: Secondary | ICD-10-CM | POA: Diagnosis not present

## 2020-02-21 LAB — POCT INR: INR: 3.3 — AB (ref 2.0–3.0)

## 2020-02-21 NOTE — Patient Instructions (Signed)
Description   Continue taking  Warfarin 1 tablet daily except 1/2 tablet on Sundays, Tuesdays and Thursdays. Recheck INR in 6 weeks. Call [phone] call with any new medications or if scheduled for any other procedures.      

## 2020-03-14 ENCOUNTER — Other Ambulatory Visit: Payer: Self-pay | Admitting: Neurology

## 2020-03-22 ENCOUNTER — Ambulatory Visit (INDEPENDENT_AMBULATORY_CARE_PROVIDER_SITE_OTHER): Payer: PPO

## 2020-03-22 DIAGNOSIS — I495 Sick sinus syndrome: Secondary | ICD-10-CM | POA: Diagnosis not present

## 2020-03-22 LAB — CUP PACEART REMOTE DEVICE CHECK
Battery Impedance: 1374 Ohm
Battery Remaining Longevity: 56 mo
Battery Voltage: 2.76 V
Brady Statistic RV Percent Paced: 45 %
Date Time Interrogation Session: 20211105094154
Implantable Lead Implant Date: 20060326
Implantable Lead Implant Date: 20060326
Implantable Lead Location: 753859
Implantable Lead Location: 753860
Implantable Lead Model: 5076
Implantable Lead Model: 5076
Implantable Pulse Generator Implant Date: 20120817
Lead Channel Impedance Value: 429 Ohm
Lead Channel Impedance Value: 67 Ohm
Lead Channel Pacing Threshold Amplitude: 0.625 V
Lead Channel Pacing Threshold Pulse Width: 0.4 ms
Lead Channel Setting Pacing Amplitude: 2.5 V
Lead Channel Setting Pacing Pulse Width: 0.4 ms
Lead Channel Setting Sensing Sensitivity: 5.6 mV

## 2020-03-22 NOTE — Progress Notes (Signed)
Remote pacemaker transmission.   

## 2020-03-26 DIAGNOSIS — R3121 Asymptomatic microscopic hematuria: Secondary | ICD-10-CM | POA: Diagnosis not present

## 2020-03-26 DIAGNOSIS — R3915 Urgency of urination: Secondary | ICD-10-CM | POA: Diagnosis not present

## 2020-03-26 DIAGNOSIS — R311 Benign essential microscopic hematuria: Secondary | ICD-10-CM | POA: Diagnosis not present

## 2020-03-26 DIAGNOSIS — R300 Dysuria: Secondary | ICD-10-CM | POA: Diagnosis not present

## 2020-03-29 ENCOUNTER — Other Ambulatory Visit: Payer: Self-pay | Admitting: *Deleted

## 2020-03-29 MED ORDER — WARFARIN SODIUM 5 MG PO TABS: ORAL_TABLET | ORAL | 1 refills | Status: DC

## 2020-04-02 ENCOUNTER — Ambulatory Visit (INDEPENDENT_AMBULATORY_CARE_PROVIDER_SITE_OTHER): Payer: PPO | Admitting: *Deleted

## 2020-04-02 ENCOUNTER — Other Ambulatory Visit: Payer: Self-pay

## 2020-04-02 DIAGNOSIS — Z5181 Encounter for therapeutic drug level monitoring: Secondary | ICD-10-CM

## 2020-04-02 DIAGNOSIS — I350 Nonrheumatic aortic (valve) stenosis: Secondary | ICD-10-CM | POA: Diagnosis not present

## 2020-04-02 DIAGNOSIS — I359 Nonrheumatic aortic valve disorder, unspecified: Secondary | ICD-10-CM

## 2020-04-02 LAB — POCT INR: INR: 2.5 (ref 2.0–3.0)

## 2020-04-02 NOTE — Patient Instructions (Signed)
Description   Continue taking  Warfarin 1 tablet daily except 1/2 tablet on Sundays, Tuesdays and Thursdays. Recheck INR in 6 weeks. Call [phone] call with any new medications or if scheduled for any other procedures.      

## 2020-04-15 DIAGNOSIS — R3915 Urgency of urination: Secondary | ICD-10-CM | POA: Diagnosis not present

## 2020-04-15 DIAGNOSIS — N3281 Overactive bladder: Secondary | ICD-10-CM | POA: Diagnosis not present

## 2020-04-30 ENCOUNTER — Telehealth: Payer: Self-pay | Admitting: Neurology

## 2020-04-30 MED ORDER — CARBIDOPA-LEVODOPA 25-100 MG PO TABS
ORAL_TABLET | ORAL | 2 refills | Status: DC
Start: 2020-04-30 — End: 2020-05-30

## 2020-04-30 MED ORDER — CARBIDOPA-LEVODOPA 25-250 MG PO TABS: 1.0000 | ORAL_TABLET | Freq: Four times a day (QID) | ORAL | 2 refills | Status: DC

## 2020-04-30 NOTE — Telephone Encounter (Signed)
The prescription for the Sinemet 25/100 mg tablets and a 25/250 mg tablets were sent in.

## 2020-04-30 NOTE — Addendum Note (Signed)
Addended by: York Spaniel on: 04/30/2020 05:35 PM   Modules accepted: Orders

## 2020-04-30 NOTE — Telephone Encounter (Signed)
Pt.'s [name] is on [place]. She is requesting a refill for carbidopa-levodopa (SINEMET IR) 25-100 MG tablet 3 times a day & carbidopa-levodopa (SINEMET IR) 25-250 MG tablet.  4 times a day  Pharmacy: [facility] [phone])

## 2020-05-16 ENCOUNTER — Ambulatory Visit (INDEPENDENT_AMBULATORY_CARE_PROVIDER_SITE_OTHER): Payer: PPO | Admitting: *Deleted

## 2020-05-16 ENCOUNTER — Other Ambulatory Visit: Payer: Self-pay

## 2020-05-16 DIAGNOSIS — Z5181 Encounter for therapeutic drug level monitoring: Secondary | ICD-10-CM

## 2020-05-16 DIAGNOSIS — I350 Nonrheumatic aortic (valve) stenosis: Secondary | ICD-10-CM | POA: Diagnosis not present

## 2020-05-16 DIAGNOSIS — I359 Nonrheumatic aortic valve disorder, unspecified: Secondary | ICD-10-CM

## 2020-05-16 LAB — POCT INR: INR: 2.9 (ref 2.0–3.0)

## 2020-05-16 NOTE — Patient Instructions (Signed)
Description   Continue taking  Warfarin 1 tablet daily except 1/2 tablet on Sundays, Tuesdays and Thursdays. Recheck INR in 6 weeks. Call [phone] call with any new medications or if scheduled for any other procedures.      

## 2020-05-27 ENCOUNTER — Other Ambulatory Visit: Payer: Self-pay | Admitting: *Deleted

## 2020-05-27 MED ORDER — WARFARIN SODIUM 5 MG PO TABS: ORAL_TABLET | ORAL | 1 refills | Status: DC

## 2020-05-29 ENCOUNTER — Telehealth: Payer: Self-pay | Admitting: Neurology

## 2020-05-29 NOTE — Telephone Encounter (Signed)
Called and spoke to patient's wife who stated that patient has been on the same medications for quite some time and both wife and patient feel like patient is having an increase in falls and tremors.  Asking if his medications need adjusted.  Aware Dr. Anne Hahn is out of office til Monday and will wait for his return versus sending message to [facility].  Is there any new medication he can try?

## 2020-05-29 NOTE — Telephone Encounter (Signed)
Pt's wife, Shaquel Chavous (on [place]) called, he is shaking and falling more, and difficulty walking, and getting up and down. Is there anything else we can do? Would like a call from the nurse or Drl Willis.

## 2020-05-30 MED ORDER — CARBIDOPA-LEVODOPA 25-100 MG PO TABS
ORAL_TABLET | ORAL | 2 refills | Status: DC
Start: 2020-05-30 — End: 2020-06-04

## 2020-05-30 NOTE — Addendum Note (Signed)
Addended by: York Spaniel on: 05/30/2020 12:52 PM   Modules accepted: Orders

## 2020-05-30 NOTE — Telephone Encounter (Signed)
I called and left a message, the patient is to go up on the Sinemet 25/100 mg tablet taking 1/2 tablet with each of the 4 doses of the 25/250 mg tablets.  I would like to get the patient set up for physical therapy, if they are amenable to this, they are to call our office.

## 2020-06-04 MED ORDER — CARBIDOPA-LEVODOPA 25-100 MG PO TABS
ORAL_TABLET | ORAL | 1 refills | Status: DC
Start: 2020-06-04 — End: 2020-08-28

## 2020-06-04 NOTE — Telephone Encounter (Signed)
Pt's wife LVM today: Spoke with Dr. Anne Hahn last week and he has increased carbidopa-levodopa (SINEMET IR) 25-100 MG tablet . He was taking a half a tablet twice a day and he increased to a half a tablet four times a day. Because of that I'm running out of medicaton before can be authorized to get this refilled. So wanting Dr. Anne Hahn to send a new prescription for a half a tablet four times a day. Would like a call back [phone].

## 2020-06-04 NOTE — Addendum Note (Signed)
Addended by: York Spaniel on: 06/04/2020 12:09 PM   Modules accepted: Orders

## 2020-06-04 NOTE — Telephone Encounter (Signed)
I called and left a message, I believe that the prescription was already sent in on the 13th of this month, but I will send it in again.  If the patient wishes to undergo physical therapy, he is to contact our office.

## 2020-06-19 ENCOUNTER — Other Ambulatory Visit: Payer: Self-pay

## 2020-06-19 ENCOUNTER — Encounter: Payer: Self-pay | Admitting: Internal Medicine

## 2020-06-19 ENCOUNTER — Ambulatory Visit (INDEPENDENT_AMBULATORY_CARE_PROVIDER_SITE_OTHER): Payer: PPO

## 2020-06-19 ENCOUNTER — Ambulatory Visit: Payer: PPO | Admitting: Internal Medicine

## 2020-06-19 VITALS — BP 132/68 | HR 66 | Ht 67.0 in | Wt 157.2 lb

## 2020-06-19 DIAGNOSIS — I495 Sick sinus syndrome: Secondary | ICD-10-CM

## 2020-06-19 DIAGNOSIS — I350 Nonrheumatic aortic (valve) stenosis: Secondary | ICD-10-CM | POA: Diagnosis not present

## 2020-06-19 DIAGNOSIS — I4821 Permanent atrial fibrillation: Secondary | ICD-10-CM

## 2020-06-19 DIAGNOSIS — I10 Essential (primary) hypertension: Secondary | ICD-10-CM

## 2020-06-19 DIAGNOSIS — I359 Nonrheumatic aortic valve disorder, unspecified: Secondary | ICD-10-CM | POA: Diagnosis not present

## 2020-06-19 DIAGNOSIS — Z5181 Encounter for therapeutic drug level monitoring: Secondary | ICD-10-CM

## 2020-06-19 DIAGNOSIS — D6869 Other thrombophilia: Secondary | ICD-10-CM | POA: Diagnosis not present

## 2020-06-19 LAB — CUP PACEART INCLINIC DEVICE CHECK
Battery Impedance: 1347 Ohm
Battery Remaining Longevity: 57 mo
Battery Voltage: 2.76 V
Brady Statistic RV Percent Paced: 45 %
Date Time Interrogation Session: 20220202145158
Implantable Lead Implant Date: 20060326
Implantable Lead Implant Date: 20060326
Implantable Lead Location: 753859
Implantable Lead Location: 753860
Implantable Lead Model: 5076
Implantable Lead Model: 5076
Implantable Pulse Generator Implant Date: 20120817
Lead Channel Impedance Value: 422 Ohm
Lead Channel Impedance Value: 67 Ohm
Lead Channel Pacing Threshold Amplitude: 0.75 V
Lead Channel Pacing Threshold Pulse Width: 0.4 ms
Lead Channel Sensing Intrinsic Amplitude: 22.4 mV
Lead Channel Setting Pacing Amplitude: 2.5 V
Lead Channel Setting Pacing Pulse Width: 0.4 ms
Lead Channel Setting Sensing Sensitivity: 5.6 mV

## 2020-06-19 LAB — POCT INR: INR: 2.4 (ref 2.0–3.0)

## 2020-06-19 NOTE — Patient Instructions (Signed)
Description   Take 1.5 tablets today, then resume same dosage of Warfarin 1 tablet daily except 1/2 tablet on Sundays, Tuesdays and Thursdays. Recheck INR in 6 weeks. Call [phone] call with any new medications or if scheduled for any other procedures.

## 2020-06-19 NOTE — Progress Notes (Signed)
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 male who presents today for routine electrophysiology followup.  Since last being seen in our clinic, the patient reports doing very well.  Today, he denies symptoms of palpitations, chest pain, shortness of breath,  lower extremity edema, dizziness, presyncope, or syncope.  The patient is otherwise without complaint today.   Past Medical History:  Diagnosis Date  . Abnormality of gait 08/27/2014  . Anxiety   . Aortic stenosis    s/p AVR by Dr Laneta Simmers  . Arthritis    left wrist- probable- not diagonised  . Asthma    in the past  . CAD (coronary artery disease)    s/p CABG 2006  . GERD (gastroesophageal reflux disease)   . H/O seasonal allergies   . H/O: rheumatic fever   . Hearing deficit    Bilateral hearing aids  . Hematuria    had CT scan  . Hiatal hernia   . History of kidney stones   . HOH (hard of hearing)   . Hyperlipidemia   . Hypertension   . Hyperthyroidism   . Inguinal hernia    right side; watching at this time per wife's report  . Memory difficulty 08/27/2014  . Pacemaker   . Parkinson's disease   . Persistent atrial fibrillation (HCC)   . Scarlet fever    as a child  . Shortness of breath   . Sick sinus syndrome (HCC)    s/p PPM (MDT) 2006   Past Surgical History:  Procedure Laterality Date  . AORTIC VALVE REPLACEMENT  2006  . CARDIOVERSION N/A 09/14/2013   Procedure: CARDIOVERSION;  Surgeon: Thurmon Fair, MD;  Location: [facility] END[facility]COPY;  Service: Cardiovascular;  Laterality: N/A;  . CHOLECYSTECTOMY N/A 07/21/2013   Procedure: LAPAROSCOPIC CHOLECYSTECTOMY;  Surgeon: Mariella Saa, MD;  Location: [facility] OR;  Service: General;  Laterality: N/A;  . COLONOSCOPY W/ POLYPECTOMY    . COLONOSCOPY WITH PROPOFOL N/A 08/31/2016   Procedure: COLONOSCOPY WITH PROPOFOL;  Surgeon: Charlott Rakes, MD;  Location: [facility] ENDOSCOPY;  Service: Endoscopy;  Laterality: N/A;  . CORONARY ARTERY  BYPASS GRAFT  2006  . DENTAL SURGERY     implants  . INSERT / REPLACE / REMOVE PACEMAKER  2006, 2012   MDT for sick sinus syndrome, gen change 8/12 by JA  . KIDNEY STONE SURGERY  1984    ROS- all systems are reviewed and negative except as per HPI above  Current Outpatient Medications  Medication Sig Dispense Refill  . aspirin 81 MG tablet Take 81 mg by mouth daily.    Marland Kitchen atorvastatin (LIPITOR) 20 MG tablet Take 1 tablet (20 mg total) by mouth daily. 90 tablet 3  . carbidopa-levodopa (SINEMET IR) 25-100 MG tablet TAKE 1/2 TABLET four times a day with the sinemet 25/250 dosing 180 tablet 1  . carbidopa-levodopa (SINEMET) 25-250 MG tablet Take 1 tablet by mouth 4 (four) times daily. 360 tablet 2  . entacapone (COMTAN) 200 MG tablet Take 1 tablet (200 mg total) by mouth 4 (four) times daily. 360 tablet 3  . esomeprazole (NEXIUM) 20 MG capsule Take 20 mg by mouth daily at 12 noon.    Marland Kitchen levothyroxine (SYNTHROID, LEVOTHROID) 125 MCG tablet Take 125 mcg by mouth daily.    . metoprolol succinate (TOPROL-XL) 25 MG 24 hr tablet Take 1 tablet (25 mg total) by mouth daily. 90 tablet 3  . nitroGLYCERIN (NITROSTAT) 0.4  MG SL tablet PLACE 1 TABLET UNDER TONGUE EVERY 5 MINUTES FOR 3 DOSES AS NEEDED FOR CHEST PAIN 25 tablet 3  . rOPINIRole (REQUIP) 2 MG tablet Take 1 tablet by mouth 3 times a day 270 tablet 2  . UNABLE TO FIND Med Name: Hemp oil    . warfarin (COUMADIN) 5 MG tablet Take 1/2 to 1 tablet daily or as directed by Coumadin clinic. 90 tablet 1   No current facility-administered medications for this visit.    Physical Exam: Vitals:   06/19/20 1458  BP: 132/68  Pulse: 66  SpO2: 99%  Weight: 157 lb 3.2 oz (71.3 kg)  Height: 5\' 7"  (1.702 m)    GEN- The patient is elderly appearing, alert and oriented x 3 today.   Head- normocephalic, atraumatic, wearing a protective helmet today Eyes-  Sclera clear, conjunctiva pink Ears- hearing intact Oropharynx- clear Lungs-  normal work of  breathing Chest- pacemaker pocket is well healed Heart- irregular rate and rhythm  GI- soft  Extremities- no clubbing, cyanosis, + edema + tremor of parkinsons  Pacemaker interrogation- reviewed in detail today,  See PACEART report  ekg tracing ordered today is personally reviewed and shows afib, LBBB  Assessment and Plan:  1. Symptomatic bradycardia  Normal pacemaker function See Pace Art report No changes today he is not device dependant today  2. Permanent afib Rate controlled Asymptomatic On coumadin  3. S/p AVR 2006 On ASA and coumadin  4. HTN Stable No change required today   Risks, benefits and potential toxicities for medications prescribed and/or refilled reviewed with patient today.   Return to see EP PA in a year  2007 MD, [facility] 06/19/2020 2:59 PM

## 2020-06-19 NOTE — Patient Instructions (Addendum)
Medication Instructions:  Your physician recommends that you continue on your current medications as directed. Please refer to the Current Medication list given to you today.  Labwork: None ordered.  Testing/Procedures: None ordered.  Follow-Up:  Your physician wants you to follow-up in: one year with Donald [name].   You will receive a reminder letter in the mail two months in advance. If you don't receive a letter, please call our office to schedule the follow-up appointment.  Remote monitoring is used to monitor your ICD from home. This monitoring reduces the number of office visits required to check your device to one time per year. It allows [place] to keep an eye on the functioning of your device to ensure it is working properly. You are scheduled for a device check from home on 06/21/20. You may send your transmission at any time that day. If you have a wireless device, the transmission will be sent automatically. After your physician reviews your transmission, you will receive a postcard with your next transmission date.  Any Other Special Instructions Will Be Listed Below (If Applicable).  If you need a refill on your cardiac medications before your next appointment, please call your pharmacy.

## 2020-06-20 ENCOUNTER — Other Ambulatory Visit: Payer: Self-pay | Admitting: Emergency Medicine

## 2020-06-20 MED ORDER — ROPINIROLE HCL 2 MG PO TABS: 2.0000 mg | ORAL_TABLET | Freq: Three times a day (TID) | ORAL | 3 refills | Status: DC

## 2020-06-21 ENCOUNTER — Ambulatory Visit (INDEPENDENT_AMBULATORY_CARE_PROVIDER_SITE_OTHER): Payer: PPO

## 2020-06-21 DIAGNOSIS — I495 Sick sinus syndrome: Secondary | ICD-10-CM | POA: Diagnosis not present

## 2020-06-21 LAB — CUP PACEART REMOTE DEVICE CHECK
Battery Impedance: 1376 Ohm
Battery Remaining Longevity: 56 mo
Battery Voltage: 2.76 V
Brady Statistic RV Percent Paced: 41 %
Date Time Interrogation Session: 20220204085725
Implantable Lead Implant Date: 20060326
Implantable Lead Implant Date: 20060326
Implantable Lead Location: 753859
Implantable Lead Location: 753860
Implantable Lead Model: 5076
Implantable Lead Model: 5076
Implantable Pulse Generator Implant Date: 20120817
Lead Channel Impedance Value: 398 Ohm
Lead Channel Impedance Value: 67 Ohm
Lead Channel Pacing Threshold Amplitude: 0.75 V
Lead Channel Pacing Threshold Pulse Width: 0.4 ms
Lead Channel Setting Pacing Amplitude: 2.5 V
Lead Channel Setting Pacing Pulse Width: 0.4 ms
Lead Channel Setting Sensing Sensitivity: 5.6 mV

## 2020-06-25 NOTE — Progress Notes (Signed)
Remote pacemaker transmission.   

## 2020-07-11 ENCOUNTER — Other Ambulatory Visit: Payer: Self-pay

## 2020-07-11 MED ORDER — ATORVASTATIN CALCIUM 20 MG PO TABS: 20.0000 mg | ORAL_TABLET | Freq: Every day | ORAL | 3 refills | Status: DC

## 2020-07-11 MED ORDER — METOPROLOL SUCCINATE ER 25 MG PO TB24: 25.0000 mg | ORAL_TABLET | Freq: Every day | ORAL | 3 refills | Status: DC

## 2020-07-30 ENCOUNTER — Other Ambulatory Visit: Payer: Self-pay

## 2020-07-30 ENCOUNTER — Ambulatory Visit (INDEPENDENT_AMBULATORY_CARE_PROVIDER_SITE_OTHER): Payer: PPO | Admitting: *Deleted

## 2020-07-30 DIAGNOSIS — I350 Nonrheumatic aortic (valve) stenosis: Secondary | ICD-10-CM | POA: Diagnosis not present

## 2020-07-30 DIAGNOSIS — E039 Hypothyroidism, unspecified: Secondary | ICD-10-CM | POA: Diagnosis not present

## 2020-07-30 DIAGNOSIS — I359 Nonrheumatic aortic valve disorder, unspecified: Secondary | ICD-10-CM | POA: Diagnosis not present

## 2020-07-30 DIAGNOSIS — Z5181 Encounter for therapeutic drug level monitoring: Secondary | ICD-10-CM

## 2020-07-30 DIAGNOSIS — E7800 Pure hypercholesterolemia, unspecified: Secondary | ICD-10-CM | POA: Diagnosis not present

## 2020-07-30 DIAGNOSIS — D649 Anemia, unspecified: Secondary | ICD-10-CM | POA: Diagnosis not present

## 2020-07-30 DIAGNOSIS — Z125 Encounter for screening for malignant neoplasm of prostate: Secondary | ICD-10-CM | POA: Diagnosis not present

## 2020-07-30 LAB — POCT INR: INR: 2.9 (ref 2.0–3.0)

## 2020-07-30 NOTE — Patient Instructions (Signed)
Description   Continue taking  Warfarin 1 tablet daily except 1/2 tablet on Sundays, Tuesdays and Thursdays. Recheck INR in 6 weeks. Call [phone] call with any new medications or if scheduled for any other procedures.

## 2020-08-06 DIAGNOSIS — I119 Hypertensive heart disease without heart failure: Secondary | ICD-10-CM | POA: Diagnosis not present

## 2020-08-06 DIAGNOSIS — D509 Iron deficiency anemia, unspecified: Secondary | ICD-10-CM | POA: Diagnosis not present

## 2020-08-06 DIAGNOSIS — R82998 Other abnormal findings in urine: Secondary | ICD-10-CM | POA: Diagnosis not present

## 2020-08-06 DIAGNOSIS — G20 Parkinson's disease: Secondary | ICD-10-CM | POA: Diagnosis not present

## 2020-08-06 DIAGNOSIS — Z0000 Encounter for general adult medical examination without abnormal findings: Secondary | ICD-10-CM | POA: Diagnosis not present

## 2020-08-06 DIAGNOSIS — Z1331 Encounter for screening for depression: Secondary | ICD-10-CM | POA: Diagnosis not present

## 2020-08-06 DIAGNOSIS — I4821 Permanent atrial fibrillation: Secondary | ICD-10-CM | POA: Diagnosis not present

## 2020-08-06 DIAGNOSIS — I25810 Atherosclerosis of coronary artery bypass graft(s) without angina pectoris: Secondary | ICD-10-CM | POA: Diagnosis not present

## 2020-08-06 DIAGNOSIS — E039 Hypothyroidism, unspecified: Secondary | ICD-10-CM | POA: Diagnosis not present

## 2020-08-06 DIAGNOSIS — E7800 Pure hypercholesterolemia, unspecified: Secondary | ICD-10-CM | POA: Diagnosis not present

## 2020-08-06 DIAGNOSIS — D692 Other nonthrombocytopenic purpura: Secondary | ICD-10-CM | POA: Diagnosis not present

## 2020-08-06 DIAGNOSIS — D6869 Other thrombophilia: Secondary | ICD-10-CM | POA: Diagnosis not present

## 2020-08-06 DIAGNOSIS — Z1339 Encounter for screening examination for other mental health and behavioral disorders: Secondary | ICD-10-CM | POA: Diagnosis not present

## 2020-08-06 DIAGNOSIS — Z1212 Encounter for screening for malignant neoplasm of rectum: Secondary | ICD-10-CM | POA: Diagnosis not present

## 2020-08-15 ENCOUNTER — Other Ambulatory Visit: Payer: Self-pay | Admitting: Physician Assistant

## 2020-08-15 ENCOUNTER — Other Ambulatory Visit: Payer: Self-pay

## 2020-08-15 ENCOUNTER — Encounter: Payer: Self-pay | Admitting: Neurology

## 2020-08-15 ENCOUNTER — Ambulatory Visit: Payer: PPO | Admitting: Neurology

## 2020-08-15 VITALS — BP 101/65 | HR 85 | Ht 69.0 in | Wt 163.0 lb

## 2020-08-15 DIAGNOSIS — G20 Parkinson's disease: Secondary | ICD-10-CM | POA: Diagnosis not present

## 2020-08-15 DIAGNOSIS — R413 Other amnesia: Secondary | ICD-10-CM

## 2020-08-15 DIAGNOSIS — R269 Unspecified abnormalities of gait and mobility: Secondary | ICD-10-CM | POA: Diagnosis not present

## 2020-08-15 DIAGNOSIS — D649 Anemia, unspecified: Secondary | ICD-10-CM | POA: Diagnosis not present

## 2020-08-15 DIAGNOSIS — R634 Abnormal weight loss: Secondary | ICD-10-CM | POA: Diagnosis not present

## 2020-08-15 DIAGNOSIS — D509 Iron deficiency anemia, unspecified: Secondary | ICD-10-CM

## 2020-08-15 MED ORDER — ROPINIROLE HCL 3 MG PO TABS: 3.0000 mg | ORAL_TABLET | Freq: Three times a day (TID) | ORAL | 3 refills | Status: DC

## 2020-08-15 NOTE — Progress Notes (Signed)
Reason for visit: Parkinson's disease  Donald [name] is an 80 y.o. male  History of present illness:  Donald [name] is a 80 year old left-handed white male with history of Parkinson disease associated with a significant gait disorder.  He does have a mild memory disorder, he comes in with his wife today.  He is significantly hard of hearing.  He is using a walker for ambulation, but he continues to fall on a regular basis.  His wife indicates that he has a tendency to lean backwards and therefore will fall even while using a walker.  The patient also is talking asleep, and apparently he is not having any hallucinations per se.  The patient is on Sinemet 25/100 mg tablets taking 1/2 tablet 4 times a day with a 25/250 mg tablet.  He is taking Comtan 200 mg 4 times daily and he is on Requip 2 mg 3 times daily.  The patient is freezing some, this appears to be worse in the afternoon when he is tired.  He does take a nap when he is an active.  The patient continues to do [employer]  He wears a helmet as he is on Coumadin therapy.   Past Medical History:  Diagnosis Date  . Abnormality of gait 08/27/2014  . Anxiety   . Aortic stenosis    s/p AVR by Dr Laneta Simmers  . Arthritis    left wrist- probable- not diagonised  . Asthma    in the past  . CAD (coronary artery disease)    s/p CABG 2006  . GERD (gastroesophageal reflux disease)   . H/O seasonal allergies   . H/O: rheumatic fever   . Hearing deficit    Bilateral hearing aids  . Hematuria    had CT scan  . Hiatal hernia   . History of kidney stones   . HOH (hard of hearing)   . Hyperlipidemia   . Hypertension   . Hyperthyroidism   . Inguinal hernia    right side; watching at this time per wife's report  . Memory difficulty 08/27/2014  . Pacemaker   . Parkinson's disease   . Persistent atrial fibrillation (HCC)   . Scarlet fever    as a child  . Shortness of breath   . Sick sinus syndrome (HCC)    s/p PPM (MDT) 2006     Past Surgical History:  Procedure Laterality Date  . AORTIC VALVE REPLACEMENT  2006  . CARDIOVERSION N/A 09/14/2013   Procedure: CARDIOVERSION;  Surgeon: Thurmon Fair, MD;  Location: [facility] ENDOSCOPY;  Service: Cardiovascular;  Laterality: N/A;  . CHOLECYSTECTOMY N/A 07/21/2013   Procedure: LAPAROSCOPIC CHOLECYSTECTOMY;  Surgeon: Mariella Saa, MD;  Location: [facility] OR;  Service: General;  Laterality: N/A;  . COLONOSCOPY W/ POLYPECTOMY    . COLONOSCOPY WITH PROPOFOL N/A 08/31/2016   Procedure: COLONOSCOPY WITH PROPOFOL;  Surgeon: Charlott Rakes, MD;  Location: [facility] ENDOSCOPY;  Service: Endoscopy;  Laterality: N/A;  . CORONARY ARTERY BYPASS GRAFT  2006  . DENTAL SURGERY     implants  . INSERT / REPLACE / REMOVE PACEMAKER  2006, 2012   MDT for sick sinus syndrome, gen change 8/12 by JA  . KIDNEY STONE SURGERY  1984    Family History  Problem Relation Age of Onset  . Dementia Mother   . Parkinsonism Mother   . Heart disease Father   . Heart disease Sister   . Heart disease Brother   . Tremor Brother  Unilateral tremor  . Coronary artery disease Other        family hx of    Social history:  reports that he has never smoked. He has never used smokeless tobacco. He reports that he does not drink alcohol and does not use drugs.    Allergies  Allergen Reactions  . Sulfa Antibiotics     unknown    Medications:  Prior to Admission medications   Medication Sig Start Date End Date Taking? Authorizing Provider  aspirin 81 MG tablet Take 81 mg by mouth daily.   Yes [provider]  atorvastatin (LIPITOR) 20 MG tablet Take 1 tablet (20 mg total) by mouth daily. 07/11/20  Yes Allred, Fayrene Fearing, MD  carbidopa-levodopa (SINEMET IR) 25-100 MG tablet TAKE 1/2 TABLET four times a day with the sinemet 25/250 dosing 06/04/20  Yes York Spaniel, MD  carbidopa-levodopa (SINEMET) 25-250 MG tablet Take 1 tablet by mouth 4 (four) times daily. 04/30/20  Yes York Spaniel, MD   entacapone (COMTAN) 200 MG tablet Take 1 tablet (200 mg total) by mouth 4 (four) times daily. 02/19/20  Yes York Spaniel, MD  esomeprazole (NEXIUM) 20 MG capsule Take 40 mg by mouth daily.   Yes [provider]  ferrous sulfate 325 (65 FE) MG tablet Take 325 mg by mouth daily with breakfast.   Yes [provider]  levothyroxine (SYNTHROID, LEVOTHROID) 125 MCG tablet Take 125 mcg by mouth daily.   Yes [provider]  metoprolol succinate (TOPROL-XL) 25 MG 24 hr tablet Take 1 tablet (25 mg total) by mouth daily. 07/11/20  Yes Allred, Fayrene Fearing, MD  nitroGLYCERIN (NITROSTAT) 0.4 MG SL tablet PLACE 1 TABLET UNDER TONGUE EVERY 5 MINUTES FOR 3 DOSES AS NEEDED FOR CHEST PAIN 10/18/19  Yes Rosalio Macadamia, NP  polyethylene glycol (MIRALAX / GLYCOLAX) 17 g packet Take 17 g by mouth daily.   Yes [provider]  rOPINIRole (REQUIP) 2 MG tablet Take 1 tablet (2 mg total) by mouth 3 (three) times daily. 06/20/20  Yes York Spaniel, MD  UNABLE TO FIND Med Name: Hemp oil   Yes [provider]  warfarin (COUMADIN) 5 MG tablet Take 1/2 to 1 tablet daily or as directed by Coumadin clinic. 05/27/20  Yes Nahser, Deloris Ping, MD    ROS:  Out of a complete 14 system review of symptoms, the patient complains only of the following symptoms, and all other reviewed systems are negative.  Walking problem, falls Memory problems   Blood pressure 101/65, pulse 85, height 5\' 9"  (1.753 m), weight 163 lb (73.9 kg).  Physical Exam  General: The patient is alert and cooperative at the time of the examination.  Skin: No significant peripheral edema is noted.   Neurologic Exam  Mental status: The patient is alert and oriented x 3 at the time of the examination. The patient has apparent normal recent and remote memory, with an apparently normal attention span and concentration ability.   Cranial nerves: Facial symmetry is present. Speech is normal, no aphasia or dysarthria is  noted. Extraocular movements are full. Visual fields are full.  Masking of the face is seen, decreased eye blink noted.  Motor: The patient has good strength in all 4 extremities.  Sensory examination: Soft touch sensation is symmetric on the face, arms, and legs.  Coordination: The patient has good finger-nose-finger and heel-to-shin bilaterally.  Gait and station: The patient is able stand up independently from the chair, he walks with  a walker.  He does shuffle his feet with initiation of ambulation and with turns.  Tandem gait was not attempted.  Reflexes: Deep tendon reflexes are symmetric.   Assessment/Plan:  1.  Parkinson's disease  2.  Gait disturbance  3.  REM sleep disorder  4.  Memory disorder  The patient is having some problems with postural changes with a tendency to lean backwards.  Unfortunately, the medications for Parkinson's disease does not help this issue.  He is having some freezing episodes off and on, we may try to go up on the Requip to help combat this, he will gradually phase into taking 3 mg tablets 3 times daily.  The wife will need to look out for increased confusion.  He will follow up here in about 4 months.  He is continue the Sinemet and the Comtan.  We have discussed getting physical therapy, the wife indicates that she may not be able to afford this, physical therapy in the past has helped but as soon as the therapy is done the patient reverts back to his pre-existing walking pattern.  Marlan Palau MD 08/15/2020 2:11 PM  [facility] [address] [place], [place] [address]  Phone [phone] Fax [phone]

## 2020-08-15 NOTE — Patient Instructions (Signed)
We will go up on the requip to 3 mg three times a day. Substitute a 3 mg tablet for a 2 mg tablet each week, until he is taking 3 mg three times a day.  Week 1-  2mg /2mg /3mg   Week 2- 3mg /2mg /3mg   Then 3mg  three times a day

## 2020-08-16 ENCOUNTER — Ambulatory Visit
Admission: RE | Admit: 2020-08-16 | Discharge: 2020-08-16 | Disposition: A | Discharge status: Home / Self Care | Payer: PPO | Source: Ambulatory Visit | Attending: Physician Assistant | Admitting: Physician Assistant

## 2020-08-16 DIAGNOSIS — N323 Diverticulum of bladder: Secondary | ICD-10-CM | POA: Diagnosis not present

## 2020-08-16 DIAGNOSIS — D509 Iron deficiency anemia, unspecified: Secondary | ICD-10-CM

## 2020-08-16 DIAGNOSIS — N281 Cyst of kidney, acquired: Secondary | ICD-10-CM | POA: Diagnosis not present

## 2020-08-16 DIAGNOSIS — I700 Atherosclerosis of aorta: Secondary | ICD-10-CM | POA: Diagnosis not present

## 2020-08-16 MED ORDER — IOPAMIDOL (ISOVUE-300) INJECTION 61%
100.0000 mL | Freq: Once | INTRAVENOUS | Status: AC | PRN
  Administered 2020-08-16: 100 mL via INTRAVENOUS

## 2020-08-27 ENCOUNTER — Telehealth: Payer: Self-pay | Admitting: Internal Medicine

## 2020-08-27 ENCOUNTER — Telehealth: Payer: Self-pay | Admitting: Neurology

## 2020-08-27 ENCOUNTER — Other Ambulatory Visit: Payer: Self-pay

## 2020-08-27 MED ORDER — NITROGLYCERIN 0.4 MG SL SUBL: SUBLINGUAL_TABLET | SUBLINGUAL | 3 refills | Status: DC

## 2020-08-27 MED ORDER — ATORVASTATIN CALCIUM 20 MG PO TABS: 20.0000 mg | ORAL_TABLET | Freq: Every day | ORAL | 3 refills | Status: DC

## 2020-08-27 MED ORDER — METOPROLOL SUCCINATE ER 25 MG PO TB24: 25.0000 mg | ORAL_TABLET | Freq: Every day | ORAL | 3 refills | Status: DC

## 2020-08-27 MED ORDER — CARBIDOPA-LEVODOPA 25-250 MG PO TABS: 1.0000 | ORAL_TABLET | Freq: Four times a day (QID) | ORAL | 3 refills | Status: DC

## 2020-08-27 NOTE — Telephone Encounter (Signed)
*  STAT* If patient is at the pharmacy, call can be transferred to refill team.   1. Which medications need to be refilled? (please list name of each medication and dose if known) atorvastatin (LIPITOR) 20 MG tablet carbidopa-levodopa (SINEMET IR) 25-100 MG tablet metoprolol succinate (TOPROL-XL) 25 MG 24 hr tablet nitroGLYCERIN (NITROSTAT) 0.4 MG SL tablet  2. Which pharmacy/location (including street and city if local pharmacy) is medication to be sent to? [facility] - [place], [place] - [address]  3. Do they need a 30 day or 90 day supply? 90

## 2020-08-27 NOTE — Telephone Encounter (Signed)
[facility] [name]) need new prescription for the following medication handled by Dr. Anne Hahn:  carbidopa-levodopa (SINEMET IR) 25-100 MG tablet carbidopa-levodopa (SINEMET) 25-250 MG tablet entacapone (COMTAN) 200 MG tablet rOPINIRole (REQUIP) 3 MG tablet Sent all to [facility].   Contact info: [phone]

## 2020-08-28 ENCOUNTER — Other Ambulatory Visit: Payer: Self-pay | Admitting: Emergency Medicine

## 2020-08-28 MED ORDER — CARBIDOPA-LEVODOPA 25-100 MG PO TABS: ORAL_TABLET | ORAL | 3 refills | Status: DC

## 2020-08-28 MED ORDER — ENTACAPONE 200 MG PO TABS: 200.0000 mg | ORAL_TABLET | Freq: Four times a day (QID) | ORAL | 3 refills | Status: DC

## 2020-08-28 MED ORDER — ROPINIROLE HCL 3 MG PO TABS: 3.0000 mg | ORAL_TABLET | Freq: Three times a day (TID) | ORAL | 3 refills | Status: DC

## 2020-08-28 NOTE — Telephone Encounter (Signed)
Pharmacy change to [facility].

## 2020-09-02 DIAGNOSIS — H6123 Impacted cerumen, bilateral: Secondary | ICD-10-CM | POA: Diagnosis not present

## 2020-09-02 DIAGNOSIS — H00015 Hordeolum externum left lower eyelid: Secondary | ICD-10-CM | POA: Diagnosis not present

## 2020-09-02 DIAGNOSIS — R059 Cough, unspecified: Secondary | ICD-10-CM | POA: Diagnosis not present

## 2020-09-02 DIAGNOSIS — Z1152 Encounter for screening for COVID-19: Secondary | ICD-10-CM | POA: Diagnosis not present

## 2020-09-02 DIAGNOSIS — I4821 Permanent atrial fibrillation: Secondary | ICD-10-CM | POA: Diagnosis not present

## 2020-09-02 DIAGNOSIS — G20 Parkinson's disease: Secondary | ICD-10-CM | POA: Diagnosis not present

## 2020-09-06 ENCOUNTER — Other Ambulatory Visit: Payer: Self-pay

## 2020-09-06 ENCOUNTER — Ambulatory Visit (INDEPENDENT_AMBULATORY_CARE_PROVIDER_SITE_OTHER): Payer: PPO | Admitting: Pharmacist

## 2020-09-06 DIAGNOSIS — I4821 Permanent atrial fibrillation: Secondary | ICD-10-CM | POA: Diagnosis not present

## 2020-09-06 DIAGNOSIS — Z5181 Encounter for therapeutic drug level monitoring: Secondary | ICD-10-CM | POA: Diagnosis not present

## 2020-09-06 LAB — POCT INR: INR: 2.2 (ref 2.0–3.0)

## 2020-09-06 NOTE — Patient Instructions (Signed)
Description   Continue taking  Warfarin 1 tablet daily except 1/2 tablet on Sundays, Tuesdays and Thursdays. Recheck INR in 3 weeks. Call [phone] call with any new medications or if scheduled for any other procedures.

## 2020-09-14 DIAGNOSIS — E7800 Pure hypercholesterolemia, unspecified: Secondary | ICD-10-CM | POA: Diagnosis not present

## 2020-09-14 DIAGNOSIS — I119 Hypertensive heart disease without heart failure: Secondary | ICD-10-CM | POA: Diagnosis not present

## 2020-09-14 DIAGNOSIS — E039 Hypothyroidism, unspecified: Secondary | ICD-10-CM | POA: Diagnosis not present

## 2020-09-16 DIAGNOSIS — H903 Sensorineural hearing loss, bilateral: Secondary | ICD-10-CM | POA: Diagnosis not present

## 2020-09-18 DIAGNOSIS — E7800 Pure hypercholesterolemia, unspecified: Secondary | ICD-10-CM | POA: Diagnosis not present

## 2020-09-20 ENCOUNTER — Ambulatory Visit (INDEPENDENT_AMBULATORY_CARE_PROVIDER_SITE_OTHER): Payer: PPO

## 2020-09-20 DIAGNOSIS — I495 Sick sinus syndrome: Secondary | ICD-10-CM

## 2020-09-20 LAB — CUP PACEART REMOTE DEVICE CHECK
Battery Impedance: 1516 Ohm
Battery Remaining Longevity: 51 mo
Battery Voltage: 2.75 V
Brady Statistic RV Percent Paced: 50 %
Date Time Interrogation Session: 20220506090257
Implantable Lead Implant Date: 20060326
Implantable Lead Implant Date: 20060326
Implantable Lead Location: 753859
Implantable Lead Location: 753860
Implantable Lead Model: 5076
Implantable Lead Model: 5076
Implantable Pulse Generator Implant Date: 20120817
Lead Channel Impedance Value: 434 Ohm
Lead Channel Impedance Value: 67 Ohm
Lead Channel Pacing Threshold Amplitude: 0.625 V
Lead Channel Pacing Threshold Pulse Width: 0.4 ms
Lead Channel Setting Pacing Amplitude: 2.5 V
Lead Channel Setting Pacing Pulse Width: 0.4 ms
Lead Channel Setting Sensing Sensitivity: 5.6 mV

## 2020-09-27 ENCOUNTER — Other Ambulatory Visit: Payer: Self-pay

## 2020-09-27 ENCOUNTER — Ambulatory Visit (INDEPENDENT_AMBULATORY_CARE_PROVIDER_SITE_OTHER): Payer: PPO | Admitting: *Deleted

## 2020-09-27 DIAGNOSIS — Z5181 Encounter for therapeutic drug level monitoring: Secondary | ICD-10-CM | POA: Diagnosis not present

## 2020-09-27 LAB — POCT INR: INR: 2.9 (ref 2.0–3.0)

## 2020-09-27 NOTE — Patient Instructions (Signed)
Description   Continue taking  Warfarin 1 tablet daily except 1/2 tablet on Sundays, Tuesdays and Thursdays. Recheck INR in 4 weeks. Call [phone] call with any new medications or if scheduled for any other procedures.

## 2020-10-07 DIAGNOSIS — N3281 Overactive bladder: Secondary | ICD-10-CM | POA: Diagnosis not present

## 2020-10-07 DIAGNOSIS — R3915 Urgency of urination: Secondary | ICD-10-CM | POA: Diagnosis not present

## 2020-10-07 DIAGNOSIS — R311 Benign essential microscopic hematuria: Secondary | ICD-10-CM | POA: Diagnosis not present

## 2020-10-07 NOTE — Progress Notes (Signed)
Remote pacemaker transmission.   

## 2020-10-08 ENCOUNTER — Other Ambulatory Visit: Payer: Self-pay | Admitting: Emergency Medicine

## 2020-10-08 ENCOUNTER — Telehealth: Payer: Self-pay | Admitting: Internal Medicine

## 2020-10-08 ENCOUNTER — Telehealth: Payer: Self-pay | Admitting: Neurology

## 2020-10-08 MED ORDER — ATORVASTATIN CALCIUM 20 MG PO TABS: 20.0000 mg | ORAL_TABLET | Freq: Every day | ORAL | 2 refills | Status: DC

## 2020-10-08 MED ORDER — CARBIDOPA-LEVODOPA 25-250 MG PO TABS: 1.0000 | ORAL_TABLET | Freq: Four times a day (QID) | ORAL | 3 refills | Status: DC

## 2020-10-08 NOTE — Telephone Encounter (Signed)
Pt c/o medication issue:  1. Name of Medication: atorvastatin (LIPITOR) 20 MG tablet  2. How are you currently taking this medication (dosage and times per day)? As written  3. Are you having a reaction (difficulty breathing--STAT)? No   4. What is your medication issue? Patient needs a new prescription sent to [facility] - [place], [place] - [address]

## 2020-10-08 NOTE — Telephone Encounter (Signed)
Pt's medication was sent to pt's pharmacy as requested. Confirmation received.  °

## 2020-10-08 NOTE — Telephone Encounter (Signed)
Ivey from [facility] called requesting again for pt's refill on his carbidopa-levodopa (SINEMET) 25-250 MG tablet

## 2020-10-15 DIAGNOSIS — E7800 Pure hypercholesterolemia, unspecified: Secondary | ICD-10-CM | POA: Diagnosis not present

## 2020-10-15 DIAGNOSIS — I119 Hypertensive heart disease without heart failure: Secondary | ICD-10-CM | POA: Diagnosis not present

## 2020-10-15 DIAGNOSIS — E039 Hypothyroidism, unspecified: Secondary | ICD-10-CM | POA: Diagnosis not present

## 2020-10-15 DIAGNOSIS — D509 Iron deficiency anemia, unspecified: Secondary | ICD-10-CM | POA: Diagnosis not present

## 2020-10-17 DIAGNOSIS — H04123 Dry eye syndrome of bilateral lacrimal glands: Secondary | ICD-10-CM | POA: Diagnosis not present

## 2020-10-17 DIAGNOSIS — Z961 Presence of intraocular lens: Secondary | ICD-10-CM | POA: Diagnosis not present

## 2020-10-17 DIAGNOSIS — H26493 Other secondary cataract, bilateral: Secondary | ICD-10-CM | POA: Diagnosis not present

## 2020-10-24 DIAGNOSIS — H26492 Other secondary cataract, left eye: Secondary | ICD-10-CM | POA: Diagnosis not present

## 2020-10-24 DIAGNOSIS — H26491 Other secondary cataract, right eye: Secondary | ICD-10-CM | POA: Diagnosis not present

## 2020-10-25 ENCOUNTER — Ambulatory Visit (INDEPENDENT_AMBULATORY_CARE_PROVIDER_SITE_OTHER): Payer: PPO

## 2020-10-25 ENCOUNTER — Other Ambulatory Visit: Payer: Self-pay

## 2020-10-25 DIAGNOSIS — I4891 Unspecified atrial fibrillation: Secondary | ICD-10-CM

## 2020-10-25 DIAGNOSIS — Z5181 Encounter for therapeutic drug level monitoring: Secondary | ICD-10-CM | POA: Diagnosis not present

## 2020-10-25 LAB — POCT INR: INR: 2 (ref 2.0–3.0)

## 2020-10-25 NOTE — Patient Instructions (Signed)
Description   Take 1.5 tablets today, then start taking 1 tablet daily except 1/2 tablet on Sundays and Thursdays. Recheck INR in 2 weeks. Call [phone] call with any new medications or if scheduled for any other procedures.

## 2020-11-08 ENCOUNTER — Ambulatory Visit (INDEPENDENT_AMBULATORY_CARE_PROVIDER_SITE_OTHER): Payer: PPO | Admitting: Pharmacist

## 2020-11-08 ENCOUNTER — Other Ambulatory Visit: Payer: Self-pay

## 2020-11-08 DIAGNOSIS — Z5181 Encounter for therapeutic drug level monitoring: Secondary | ICD-10-CM

## 2020-11-08 LAB — POCT INR: INR: 3.6 — AB (ref 2.0–3.0)

## 2020-11-08 NOTE — Patient Instructions (Signed)
Description   Eats some greens today and continue taking 1 tablet daily except 1/2 tablet on Sundays and Thursdays. Recheck INR in 4 weeks. Call [phone] call with any new medications or if scheduled for any other procedures.

## 2020-11-26 ENCOUNTER — Telehealth: Payer: Self-pay | Admitting: Neurology

## 2020-11-26 NOTE — Telephone Encounter (Signed)
I called and talk with the wife.  The patient currently is taking Comtan with the Sinemet 25/250 mg tablets, 1 tablet 4 times daily.  He also takes a half of a 25/100 mg tablets with this 4 times daily.  He is having some increased tremor and gait instability, he seems to be freezing up before the next dose, the wife believes that his not getting the duration response of the medication that he was previously.  We will start taking the 25/100 Sinemet between doses of the 25/250, if this is not fully effective, we will go up on the dose of the 25/100 mg tablets.  In terms of the tremor, Sinemet usually does not help, I do not wish to start the patient on anticholinergic drugs.  He will be seen in early August, we may need to consider physical therapy for gait training.

## 2020-11-26 NOTE — Telephone Encounter (Signed)
Pt's wife called wanting to inform the provider that the pt is having more trembling and tumbling than usual and is wanting to know if a medication change can help with that. Please advise.

## 2020-11-26 NOTE — Telephone Encounter (Signed)
Any advice or suggestions ?

## 2020-12-06 ENCOUNTER — Other Ambulatory Visit: Payer: Self-pay

## 2020-12-06 ENCOUNTER — Ambulatory Visit (INDEPENDENT_AMBULATORY_CARE_PROVIDER_SITE_OTHER): Payer: PPO | Admitting: *Deleted

## 2020-12-06 DIAGNOSIS — Z5181 Encounter for therapeutic drug level monitoring: Secondary | ICD-10-CM | POA: Diagnosis not present

## 2020-12-06 LAB — POCT INR: INR: 3.2 — AB (ref 2.0–3.0)

## 2020-12-06 NOTE — Patient Instructions (Signed)
Description   Continue taking 1 tablet daily except 1/2 tablet on Sundays and Thursdays. Recheck INR in 5 weeks. Call [phone] call with any new medications or if scheduled for any other procedures.

## 2020-12-11 ENCOUNTER — Other Ambulatory Visit: Payer: Self-pay | Admitting: Internal Medicine

## 2020-12-11 MED ORDER — WARFARIN SODIUM 5 MG PO TABS: ORAL_TABLET | ORAL | 0 refills | Status: DC

## 2020-12-11 MED ORDER — METOPROLOL SUCCINATE ER 25 MG PO TB24: 25.0000 mg | ORAL_TABLET | Freq: Every day | ORAL | 3 refills | Status: DC

## 2020-12-11 NOTE — Telephone Encounter (Signed)
Prescription refill request received for warfarin Lov: Donald [name], 06/19/2020 Next INR check: 01/09/2021 Warfarin tablet strength:  5mg   Prescription refills sent for warfarin.

## 2020-12-11 NOTE — Telephone Encounter (Signed)
*  STAT* If patient is at the pharmacy, call can be transferred to refill team.   1. Which medications need to be refilled? (please list name of each medication and dose if known) Warfarin and metoprolol   2. Which pharmacy/location (including street and city if local pharmacy) is medication to be sent to? [facility]   3. Do they need a 30 day or 90 day supply? 90

## 2020-12-15 DIAGNOSIS — E7800 Pure hypercholesterolemia, unspecified: Secondary | ICD-10-CM | POA: Diagnosis not present

## 2020-12-15 DIAGNOSIS — I119 Hypertensive heart disease without heart failure: Secondary | ICD-10-CM | POA: Diagnosis not present

## 2020-12-15 DIAGNOSIS — E039 Hypothyroidism, unspecified: Secondary | ICD-10-CM | POA: Diagnosis not present

## 2020-12-18 ENCOUNTER — Ambulatory Visit: Payer: PPO | Admitting: Neurology

## 2020-12-18 ENCOUNTER — Other Ambulatory Visit: Payer: Self-pay

## 2020-12-18 ENCOUNTER — Encounter: Payer: Self-pay | Admitting: Neurology

## 2020-12-18 VITALS — BP 104/58 | HR 67 | Ht 69.0 in | Wt 163.0 lb

## 2020-12-18 DIAGNOSIS — R300 Dysuria: Secondary | ICD-10-CM | POA: Diagnosis not present

## 2020-12-18 DIAGNOSIS — G20 Parkinson's disease: Secondary | ICD-10-CM

## 2020-12-18 DIAGNOSIS — R269 Unspecified abnormalities of gait and mobility: Secondary | ICD-10-CM

## 2020-12-18 DIAGNOSIS — H9190 Unspecified hearing loss, unspecified ear: Secondary | ICD-10-CM | POA: Insufficient documentation

## 2020-12-18 DIAGNOSIS — H903 Sensorineural hearing loss, bilateral: Secondary | ICD-10-CM | POA: Diagnosis not present

## 2020-12-18 DIAGNOSIS — R413 Other amnesia: Secondary | ICD-10-CM | POA: Diagnosis not present

## 2020-12-18 MED ORDER — CARBIDOPA-LEVODOPA 25-100 MG PO TABS: 1.0000 | ORAL_TABLET | Freq: Three times a day (TID) | ORAL | 3 refills | Status: DC

## 2020-12-18 NOTE — Progress Notes (Signed)
Reason for visit: Parkinson's disease, gait disorder  Donald [name] is an 80 y.o. male  History of present illness:  Donald [name] is a 80 year old left-handed white male with a history of Parkinson's disease.  The patient is very hard of hearing.  He has a gait disorder associated with Parkinson's disease, he is engaged with [employer] on a regular basis.  He continues to fall off and on, but he is falling less as he spends less time out in the yard on uneven ground.  He uses a walker to get around.  He has a tendency to go backwards when he falls.  He is wearing a helmet to protect his head, he is on anticoagulant therapy.  The patient still has some wearing off effect with medications.  He gets the Sinemet 25/250 mg tablets with Comtan at 7 AM, 12 noon, 5 PM, and at bedtime which is usually around 9 or 10 PM.  The patient is taking Sinemet 25/100 tablet, a full tablet at 9:30 AM, 2:30 PM, and 7 PM.  He has a wearing off effect oftentimes around 11 AM and 4 PM.  The patient is not having any confusion on the medication, he does have a REM sleep disorder, he will talk in his sleep.  He occasionally may choke with swallowing, he drools quite a bit.  The patient returns to this office for further evaluation.  Past Medical History:  Diagnosis Date   Abnormality of gait 08/27/2014   Anxiety    Aortic stenosis    s/p AVR by Dr Laneta Simmers   Arthritis    left wrist- probable- not diagonised   Asthma    in the past   CAD (coronary artery disease)    s/p CABG 2006   GERD (gastroesophageal reflux disease)    H/O seasonal allergies    H/O: rheumatic fever    Hearing deficit    Bilateral hearing aids   Hematuria    had CT scan   Hiatal hernia    History of kidney stones    HOH (hard of hearing)    Hyperlipidemia    Hypertension    Hyperthyroidism    Inguinal hernia    right side; watching at this time per wife's report   Memory difficulty 08/27/2014   Pacemaker    Parkinson's disease     Persistent atrial fibrillation (HCC)    Scarlet fever    as a child   Shortness of breath    Sick sinus syndrome (HCC)    s/p PPM (MDT) 2006    Past Surgical History:  Procedure Laterality Date   AORTIC VALVE REPLACEMENT  2006   CARDIOVERSION N/A 09/14/2013   Procedure: CARDIOVERSION;  Surgeon: Thurmon Fair, MD;  Location: [facility] ENDOSCOPY;  Service: Cardiovascular;  Laterality: N/A;   CHOLECYSTECTOMY N/A 07/21/2013   Procedure: LAPAROSCOPIC CHOLECYSTECTOMY;  Surgeon: Mariella Saa, MD;  Location: [facility] OR;  Service: General;  Laterality: N/A;   COLONOSCOPY W/ POLYPECTOMY     COLONOSCOPY WITH PROPOFOL N/A 08/31/2016   Procedure: COLONOSCOPY WITH PROPOFOL;  Surgeon: Charlott Rakes, MD;  Location: [facility] ENDOSCOPY;  Service: Endoscopy;  Laterality: N/A;   CORONARY ARTERY BYPASS GRAFT  2006   DENTAL SURGERY     implants   INSERT / REPLACE / REMOVE PACEMAKER  2006, 2012   MDT for sick sinus syndrome, gen change 8/12 by JA   KIDNEY STONE SURGERY  1984    Family History  Problem Relation Age of  Onset   Dementia Mother    Parkinsonism Mother    Heart disease Father    Heart disease Sister    Heart disease Brother    Tremor Brother        Unilateral tremor   Coronary artery disease Other        family hx of    Social history:  reports that he has never smoked. He has never used smokeless tobacco. He reports that he does not drink alcohol and does not use drugs.    Allergies  Allergen Reactions   Sulfa Antibiotics     unknown    Medications:  Prior to Admission medications   Medication Sig Start Date End Date Taking? Authorizing Provider  aspirin 81 MG tablet Take 81 mg by mouth daily.   Yes [provider]  atorvastatin (LIPITOR) 20 MG tablet Take 1 tablet (20 mg total) by mouth daily. 10/08/20  Yes Allred, Fayrene Fearing, MD  carbidopa-levodopa (SINEMET IR) 25-100 MG tablet TAKE 1/2 TABLET four times a day with the sinemet 25/250 dosing Patient taking differently: 1 tablet  3 (three) times daily. 08/28/20  Yes York Spaniel, MD  carbidopa-levodopa (SINEMET) 25-250 MG tablet Take 1 tablet by mouth 4 (four) times daily. 10/08/20  Yes York Spaniel, MD  entacapone (COMTAN) 200 MG tablet Take 1 tablet (200 mg total) by mouth 4 (four) times daily. 08/28/20  Yes York Spaniel, MD  esomeprazole (NEXIUM) 20 MG capsule Take 40 mg by mouth daily.   Yes [provider]  ferrous sulfate 325 (65 FE) MG tablet Take 325 mg by mouth daily with breakfast.   Yes [provider]  levothyroxine (SYNTHROID, LEVOTHROID) 125 MCG tablet Take 125 mcg by mouth daily.   Yes [provider]  metoprolol succinate (TOPROL-XL) 25 MG 24 hr tablet Take 1 tablet (25 mg total) by mouth daily. 12/11/20  Yes Allred, Fayrene Fearing, MD  nitroGLYCERIN (NITROSTAT) 0.4 MG SL tablet PLACE 1 TABLET UNDER TONGUE EVERY 5 MINUTES FOR 3 DOSES AS NEEDED FOR CHEST PAIN 08/27/20  Yes Allred, Fayrene Fearing, MD  polyethylene glycol (MIRALAX / GLYCOLAX) 17 g packet Take 17 g by mouth daily.   Yes [provider]  rOPINIRole (REQUIP) 3 MG tablet Take 1 tablet (3 mg total) by mouth in the morning, at noon, and at bedtime. 08/28/20  Yes York Spaniel, MD  UNABLE TO FIND Med Name: Hemp oil   Yes [provider]  warfarin (COUMADIN) 5 MG tablet Take 1/2 to 1 tablet daily or as directed by Coumadin clinic. 12/11/20  Yes Allred, Fayrene Fearing, MD    ROS:  Out of a complete 14 system review of symptoms, the patient complains only of the following symptoms, and all other reviewed systems are negative.  Walking difficulty Drooling Swallowing problem Talking during sleep  Blood pressure (!) 104/58, pulse 67, height 5\' 9"  (1.753 m), weight 163 lb (73.9 kg).  Physical Exam  General: The patient is alert and cooperative at the time of the examination.  Skin: No significant peripheral edema is noted.   Neurologic Exam  Mental status: The patient is alert and oriented x 3 at the time of the  examination. The patient is quite hard of hearing, it is difficult to communicate verbally with him.   Cranial nerves: Facial symmetry is present. Speech is normal, no aphasia or dysarthria is noted. Extraocular movements are full. Visual fields are full.  Masking the face is seen.  Motor: The patient has good  strength in all 4 extremities.  Sensory examination: Soft touch sensation is symmetric on the face, arms, and legs.  Coordination: The patient has good finger-nose-finger and heel-to-shin bilaterally.  Gait and station: The patient walks with a walker, he oftentimes walks up on his tiptoes, he has some shuffling gait, hesitation with turns.  He is able to walk with walker with fairly good stability however.  Reflexes: Deep tendon reflexes are symmetric.   Assessment/Plan:  1.  Parkinson's disease  2.  Gait disturbance  The patient is having a wearing off effect from the medication.  We may switch the dosing of the 25/100 mg Sinemet tablets to 10 AM, 3 PM, and 7 PM to see if we can avoid the letdown period at 11 AM and 4 PM.  The patient will follow-up in about 4 or 5 months, he may see Dr. Frances Furbish in the future.  I have given him a prescription for a U-Step walker to see if this improves his fall frequency.  He will continue [employer].  Wife indicates that they cannot afford physical therapy.  Marlan Palau MD 12/18/2020 9:27 AM  [facility] [address] [place], [place] [address]  Phone [phone] Fax [phone]

## 2020-12-20 ENCOUNTER — Ambulatory Visit (INDEPENDENT_AMBULATORY_CARE_PROVIDER_SITE_OTHER): Payer: PPO

## 2020-12-20 DIAGNOSIS — I4891 Unspecified atrial fibrillation: Secondary | ICD-10-CM | POA: Diagnosis not present

## 2020-12-23 ENCOUNTER — Telehealth: Payer: Self-pay | Admitting: Neurology

## 2020-12-23 DIAGNOSIS — G20 Parkinson's disease: Secondary | ICD-10-CM

## 2020-12-23 LAB — CUP PACEART REMOTE DEVICE CHECK
Battery Impedance: 1572 Ohm
Battery Remaining Longevity: 50 mo
Battery Voltage: 2.76 V
Brady Statistic RV Percent Paced: 52 %
Date Time Interrogation Session: 20220805082917
Implantable Lead Implant Date: 20060326
Implantable Lead Implant Date: 20060326
Implantable Lead Location: 753859
Implantable Lead Location: 753860
Implantable Lead Model: 5076
Implantable Lead Model: 5076
Implantable Pulse Generator Implant Date: 20120817
Lead Channel Impedance Value: 446 Ohm
Lead Channel Impedance Value: 67 Ohm
Lead Channel Pacing Threshold Amplitude: 0.625 V
Lead Channel Pacing Threshold Pulse Width: 0.4 ms
Lead Channel Setting Pacing Amplitude: 2.5 V
Lead Channel Setting Pacing Pulse Width: 0.4 ms
Lead Channel Setting Sensing Sensitivity: 5.6 mV

## 2020-12-23 NOTE — Telephone Encounter (Signed)
I will make the referral to Dr. Tat. 

## 2020-12-23 NOTE — Telephone Encounter (Signed)
Patient's wife called in to request referral to see Dr. Arbutus Leas at [facility] for transfer of care as Dr. Anne Hahn is retiring.

## 2020-12-24 ENCOUNTER — Telehealth: Payer: Self-pay | Admitting: Neurology

## 2020-12-24 ENCOUNTER — Encounter: Payer: Self-pay | Admitting: Neurology

## 2020-12-24 NOTE — Telephone Encounter (Signed)
Pt's wife left a vm @ 3:09 stating [facility] is asking that the rx for the step walker for pt be faxed to them @ [phone] in the fax please use Product [phone] and Product # [phone] [phone].  Wife is asking to be called once this has been done

## 2020-12-24 NOTE — Telephone Encounter (Signed)
I have sent the order as requested to [facility]. Fax confirmation received.

## 2021-01-01 DIAGNOSIS — I119 Hypertensive heart disease without heart failure: Secondary | ICD-10-CM | POA: Diagnosis not present

## 2021-01-01 DIAGNOSIS — G20 Parkinson's disease: Secondary | ICD-10-CM | POA: Diagnosis not present

## 2021-01-01 DIAGNOSIS — R269 Unspecified abnormalities of gait and mobility: Secondary | ICD-10-CM | POA: Diagnosis not present

## 2021-01-09 ENCOUNTER — Other Ambulatory Visit: Payer: Self-pay

## 2021-01-09 ENCOUNTER — Ambulatory Visit (INDEPENDENT_AMBULATORY_CARE_PROVIDER_SITE_OTHER): Payer: PPO

## 2021-01-09 DIAGNOSIS — Z5181 Encounter for therapeutic drug level monitoring: Secondary | ICD-10-CM

## 2021-01-09 LAB — POCT INR: INR: 3.2 — AB (ref 2.0–3.0)

## 2021-01-09 NOTE — Patient Instructions (Signed)
Description   Continue taking 1 tablet daily except 1/2 tablet on Sundays and Thursdays. Recheck INR in 6 weeks. Call [phone] call with any new medications or if scheduled for any other procedures.

## 2021-01-09 NOTE — Progress Notes (Signed)
Remote pacemaker transmission.   

## 2021-01-13 NOTE — Progress Notes (Signed)
Assessment/Plan:   1.  Parkinson's disease   -Patient having wearing off.  -Discontinue carbidopa/levodopa 25/250.  Trying to give him all on one version during the daytime for ease of use  -Take carbidopa/levodopa 25/100, 2 tablets @ 7am / 9:30 am/ 12 pm /2:30 pm /5 pm and 7:30 pm  -Add carbidopa/levodopa 50/200 CR at bedtime  -For now, I did not change the entacapone, but may do this in the future.  For right now, he will take it 1 tablet with the first 4 dosages of levodopa.  We did discuss opicapone.  -I did not change his ropinirole today because I am changing other things.  He will continue with 3 mg 3 times per day.  I did discuss with him that we may need to change this in the future due to concerns about memory.  -He had a levodopa challenge test done in 2017.  He was levodopa nonresponsive, both for tremor as well as from his other symptoms.  However, the patient was not particularly rigid even off of medication that day.  He was determined not to be a DBS candidate at that point in time because of this, and some memory change.  -Patient doing rock steady boxing and enjoys it.  I have no objection to that.  He wears a helmet  -Strongly encouraged physical therapy.  Has not done that for years.  Order sent.  -Met with my LCSW today.  2.  Memory change  -May have PDD, but suspect that entacapone and ropinirole could be contributing, and think that his inability to hear is a big issue with creating misunderstanding.  Would like to wean those off, but I want to make the above changes first.  Subjective:   Donald [name] was seen today in the movement disorders clinic for neurologic consultation at the request of Kathrynn Ducking, MD.  The consultation is for the evaluation of Parkinson's disease.  I saw the patient over 5 years ago for the same.  I have reviewed numerous records made available to me.  The first neurology records that I have available to me are from May, 2014, but the  patient has been seeing neurology since approximately 08/2010.  The patient notes that his first symptom was right sided tremor and tremor has persisted and been very medication resistant.   Tremor initially started in the R hand and then involved the R leg and now the jaw.  Never has tremor been on the L.   He has been on and off and back on Artane for this.  It was felt that Artane affected his memory and that is why it was discontinued, but the patient felt that this was really the only thing that helped his tremor and so he went back on it and is currently on 2 mg bid.  He has also tried clonazepam for tremor, but this caused staggering and he felt "helpless" on it.  This was at the end of 2016.  He called back and was told to try Benadryl but this did not help his tremor.  Ropinirole was started in approximately 2014 and he is currently on 27m tid.  Carbidopa/levodopa was added to his medication regimen on 01/14/2015.  I did a levodopa challenge test on the patient in February, 2017 and his tremor was really levodopa nonresponsive, but his other symptoms also did not improve significantly with levodopa.  We determined at that time that he was likely not a DBS candidate  because of that.  Trihexyphenidyl was stopped since our last visit because of memory change.  His ropinirole dosage has been decreased and increased again.  The most recent increase in ropinirole was August 15, 2020, at which time he was complaining about freezing and off episodes and his ropinirole was increased to 3 mg 3 times per day (the dose he had been on in the past).  He last saw Dr. Jannifer Franklin December 18, 2020.  Following has been an issue and continued to be an issue when he was at that visit.  Because of that, he wears a helmet when he walks (because he is also on Coumadin).  They c/o wearing off.    Doing rsb 2 days per week.  No PT in years but they don't think that they could afford that  with RSB  Current movement disorder  medications: Carbidopa/levodopa 25/250, 1 tablet 4 times per day (7 AM/noon/5 PM/9 PM) Carbidopa/levodopa 25/100, 1 tablet at 10:15 AM/3:15 PM/7 PM Entacapone 200 mg 4 times per day Ropinirole, 3 mg 3 times per day   Specific Symptoms:  Tremor: Yes.  , some, L>R Voice: softer  Postural symptoms:  Yes.    Falls?  Yes.  , last fall a week ago.  , falls  Bradykinesia symptoms: shuffling gait and slow movements Loss of smell:  No. Loss of taste:  No. Trouble with ADL's:  dresses self Depression:  No. Hallucinations:  No.  visual distortions: No. Some memory change:  wife does meds, pt drives locally   PREVIOUS MEDICATIONS: trihexyphenidyl (discontinued because of memory change); ropinirole; clonazepam (caused balance change)  ALLERGIES:   Allergies  Allergen Reactions   Sulfa Antibiotics     unknown    CURRENT MEDICATIONS:  Current Outpatient Medications  Medication Instructions   aspirin 81 mg, Oral, Daily,     atorvastatin (LIPITOR) 20 mg, Oral, Daily   carbidopa-levodopa (SINEMET CR) 50-200 MG tablet 1 tablet, Oral, Daily at bedtime   carbidopa-levodopa (SINEMET IR) 25-100 MG tablet 2 tabs @ 7am/9:30am/12pm/2:30pm/5pm/7:30pm   entacapone (COMTAN) 200 mg, Oral, 4 times daily, 2 tablets with first 4 doses of Levodopa   esomeprazole (NEXIUM) 40 mg, Oral, Daily   ferrous sulfate 325 mg, Oral, Daily with breakfast   levothyroxine (SYNTHROID) 125 mcg, Oral, Daily,     metoprolol succinate (TOPROL-XL) 25 mg, Oral, Daily   nitroGLYCERIN (NITROSTAT) 0.4 MG SL tablet PLACE 1 TABLET UNDER TONGUE EVERY 5 MINUTES FOR 3 DOSES AS NEEDED FOR CHEST PAIN   polyethylene glycol (MIRALAX / GLYCOLAX) 17 g, Oral, Daily   rOPINIRole (REQUIP) 3 mg, Oral, 3 times daily   UNABLE TO FIND Med Name: Hemp oil    warfarin (COUMADIN) 5 MG tablet Take 1/2 to 1 tablet daily or as directed by Coumadin clinic.    Objective:   VITALS:   Vitals:   01/15/21 1017  BP: (!) 126/58  Pulse: 71  SpO2: 97%   Weight: 165 lb 12.8 oz (75.2 kg)  Height: '5\' 9"'  (1.753 m)    GEN:  The patient appears stated age and is in NAD. HEENT:  Normocephalic, atraumatic.  The mucous membranes are moist. The superficial temporal arteries are without ropiness or tenderness. CV:  RRR Lungs:  CTAB Neck/HEME:  There are no carotid bruits bilaterally.  Neurological examination:  Orientation: The patient is alert and oriented x3.  Cranial nerves: There is good facial symmetry. Extraocular muscles are intact. The visual fields are full to confrontational testing. The speech is fluent  and clear. Soft palate rises symmetrically and there is no tongue deviation. Hearing is markedly decreased to conversational tone (despite hearing aid and being given headphones so that examiner could amplify her speech via microphone, he still couldn't hear and required clear mask for lip reading) Sensation: Sensation is intact to light and pinprick throughout (facial, trunk, extremities). Vibration is intact at the bilateral big toe. There is no extinction with double simultaneous stimulation. There is no sensory dermatomal level identified. Motor: Strength is 5/5 in the bilateral upper and lower extremities.   Shoulder shrug is equal and symmetric.  There is no pronator drift. Deep tendon reflexes: Deep tendon reflexes are 1/4 at the bilateral biceps, triceps, brachioradialis, patella and achilles. Plantar responses are downgoing bilaterally.  Movement examination: Tone: There is normal tone in the bilateral upper extremities.  The tone in the lower extremities is normal.  Abnormal movements: There is left greater than right upper extremity rest tremor.  Has some tremor in the legs as well. Coordination:  There is decremation with RAM's, especially with toe taps bilaterally, left greater than right. Gait and Station: The patient pushes off of the chair to arise.  He festinates so much that he walks in his toe tips until he gets into the  hall, and then is able to have a more physiologic gait with his walker.  He does turn en bloc. I have reviewed and interpreted the following labs independently   Chemistry      Component Value Date/Time   NA 140 08/31/2016 0803   K 4.1 08/31/2016 0803   CL 105 08/31/2016 0803   CO2 26 09/08/2013 1008   BUN 15 08/31/2016 0803   CREATININE 0.70 08/31/2016 0803      Component Value Date/Time   CALCIUM 9.5 09/08/2013 1008   ALKPHOS 70 07/22/2013 0350   AST 29 07/22/2013 0350   ALT 25 07/22/2013 0350   BILITOT 1.5 (H) 07/22/2013 0350      No results found for: TSH Lab Results  Component Value Date   WBC 4.1 (L) 09/08/2013   HGB 13.3 08/31/2016   HCT 39.0 08/31/2016   MCV 82.8 09/08/2013   PLT 181.0 09/08/2013     Total time spent on today's visit was 60 minutes, including both face-to-face time and nonface-to-face time.  Time included that spent on review of records (prior notes available to me/labs/imaging if pertinent), discussing treatment and goals, answering patient's questions and coordinating care.  Cc:  Tisovec, Fransico Him, MD

## 2021-01-15 ENCOUNTER — Other Ambulatory Visit: Payer: Self-pay

## 2021-01-15 ENCOUNTER — Ambulatory Visit: Payer: PPO | Admitting: Neurology

## 2021-01-15 VITALS — BP 126/58 | HR 71 | Ht 69.0 in | Wt 165.8 lb

## 2021-01-15 DIAGNOSIS — H903 Sensorineural hearing loss, bilateral: Secondary | ICD-10-CM

## 2021-01-15 DIAGNOSIS — R413 Other amnesia: Secondary | ICD-10-CM

## 2021-01-15 DIAGNOSIS — E039 Hypothyroidism, unspecified: Secondary | ICD-10-CM | POA: Diagnosis not present

## 2021-01-15 DIAGNOSIS — E7800 Pure hypercholesterolemia, unspecified: Secondary | ICD-10-CM | POA: Diagnosis not present

## 2021-01-15 DIAGNOSIS — G20 Parkinson's disease: Secondary | ICD-10-CM | POA: Diagnosis not present

## 2021-01-15 DIAGNOSIS — I119 Hypertensive heart disease without heart failure: Secondary | ICD-10-CM | POA: Diagnosis not present

## 2021-01-15 MED ORDER — CARBIDOPA-LEVODOPA ER 50-200 MG PO TBCR: 1.0000 | EXTENDED_RELEASE_TABLET | Freq: Every day | ORAL | 1 refills | Status: DC

## 2021-01-15 MED ORDER — CARBIDOPA-LEVODOPA 25-100 MG PO TABS: ORAL_TABLET | ORAL | 3 refills | Status: DC

## 2021-01-15 MED ORDER — ENTACAPONE 200 MG PO TABS: 200.0000 mg | ORAL_TABLET | Freq: Four times a day (QID) | ORAL | 3 refills | Status: DC

## 2021-01-15 NOTE — Patient Instructions (Signed)
You have been referred to [facility] for therapy. They will call you directly to schedule an appointment.  Please call [phone] if you do not hear from them.   Take Carbidopa-Levodopa 25-100 2 tablets @ 7am / 9:30 am/ 12 pm /2:30 pm /5 pm and 7:30 pm Take Carbidopa -Levodopa 50-200 CR  1 tablet @ bedtime  Take the Entacapone with the first four doses of Levodopa @ 7 am/9:30 am/12 pm and 2:30pm

## 2021-01-21 ENCOUNTER — Other Ambulatory Visit: Payer: Self-pay

## 2021-01-29 ENCOUNTER — Other Ambulatory Visit: Payer: Self-pay

## 2021-01-29 ENCOUNTER — Ambulatory Visit: Payer: PPO | Attending: Neurology | Admitting: Physical Therapy

## 2021-01-29 ENCOUNTER — Encounter: Payer: Self-pay | Admitting: Physical Therapy

## 2021-01-29 DIAGNOSIS — R2681 Unsteadiness on feet: Secondary | ICD-10-CM | POA: Diagnosis not present

## 2021-01-29 DIAGNOSIS — Z9181 History of falling: Secondary | ICD-10-CM | POA: Diagnosis not present

## 2021-01-29 DIAGNOSIS — R293 Abnormal posture: Secondary | ICD-10-CM | POA: Insufficient documentation

## 2021-01-29 DIAGNOSIS — R2689 Other abnormalities of gait and mobility: Secondary | ICD-10-CM | POA: Insufficient documentation

## 2021-01-29 DIAGNOSIS — R29818 Other symptoms and signs involving the nervous system: Secondary | ICD-10-CM | POA: Diagnosis not present

## 2021-01-29 NOTE — Therapy (Signed)
[facility] [address] [place], [place], [address] Phone: [phone]   Fax:  [phone]  Physical Therapy Evaluation  Patient Details  Name: Donald [name] MRN: [id number] Date of Birth: 10/08/1940 Referring Provider (PT): Dr. Arbutus Leas   Encounter Date: 01/29/2021   PT End of Session - 01/29/21 0932     Visit Number 1    Number of Visits 17    Date for PT Re-Evaluation 04/29/21    Authorization Type HTA - $30 co pay    PT Start Time 0836    PT Stop Time 0920    PT Time Calculation (min) 44 min    Equipment Utilized During Treatment Gait belt    Activity Tolerance Patient tolerated treatment well    Behavior During Therapy [facility] for tasks assessed/performed             Past Medical History:  Diagnosis Date   Abnormality of gait 08/27/2014   Anxiety    Aortic stenosis    s/p AVR by Dr Laneta Simmers   Arthritis    left wrist- probable- not diagonised   Asthma    in the past   CAD (coronary artery disease)    s/p CABG 2006   GERD (gastroesophageal reflux disease)    H/O seasonal allergies    H/O: rheumatic fever    Hearing deficit    Bilateral hearing aids   Hematuria    had CT scan   Hiatal hernia    History of kidney stones    HOH (hard of hearing)    Hyperlipidemia    Hypertension    Hyperthyroidism    Inguinal hernia    right side; watching at this time per wife's report   Memory difficulty 08/27/2014   Pacemaker    Parkinson's disease    Persistent atrial fibrillation (HCC)    Scarlet fever    as a child   Shortness of breath    Sick sinus syndrome (HCC)    s/p PPM (MDT) 2006    Past Surgical History:  Procedure Laterality Date   AORTIC VALVE REPLACEMENT  2006   CARDIOVERSION N/A 09/14/2013   Procedure: CARDIOVERSION;  Surgeon: Thurmon Fair, MD;  Location: [facility] ENDOSCOPY;  Service: Cardiovascular;  Laterality: N/A;   CHOLECYSTECTOMY N/A 07/21/2013   Procedure: LAPAROSCOPIC CHOLECYSTECTOMY;   Surgeon: Mariella Saa, MD;  Location: [facility] OR;  Service: General;  Laterality: N/A;   COLONOSCOPY W/ POLYPECTOMY     COLONOSCOPY WITH PROPOFOL N/A 08/31/2016   Procedure: COLONOSCOPY WITH PROPOFOL;  Surgeon: Charlott Rakes, MD;  Location: [facility] ENDOSCOPY;  Service: Endoscopy;  Laterality: N/A;   CORONARY ARTERY BYPASS GRAFT  2006   DENTAL SURGERY     implants   INSERT / REPLACE / REMOVE PACEMAKER  2006, 2012   MDT for sick sinus syndrome, gen change 8/12 by JA   KIDNEY STONE SURGERY  1984    There were no vitals filed for this visit.    Subjective Assessment - 01/29/21 0842     Subjective Pt is very hard of hearing. Wears knee and elbow pads and a helmet due to being on anticoagulant therapy. Pt's wife reports that he has had a lot of falls and has trouble getting up from a chair and getting in and out of the bed. Pt's wife reports that he tends to fall to the R and posterior. Goes to [facility] 2 times a week. Pt walks on his tip toes and has freezing episodes. Uses  a RW for gait all the time. Now seeing Dr. Arbutus Leas who has made some medication changes. Body is not slowing down as much before his next dose of medication. Has had 3 recent falls in the yard. Has a recumbent bike at home that he uses for 30 minutes throughout the week.    Pertinent History PMH: PD, CAD (s/p CABG 2006), LD, HTN, a fib, pacemaker, GERD, on anticoagulants    Limitations Walking;House hold activities;Standing    Patient Stated Goals wants to improve his balance    Currently in Pain? No/denies                [facility] PT Assessment - 01/29/21 0849       Assessment   Medical Diagnosis PD    Referring Provider (PT) Dr. Arbutus Leas    Onset Date/Surgical Date 01/15/21   date of referral   Hand Dominance Left    Prior Therapy PT once before, 2-3 years ago      Precautions   Precautions Fall;ICD/Pacemaker    Precaution Comments very hard of hearing, on anticoagulant therapy      Balance Screen   Has the patient fallen  in the past 6 months Yes    How many times? 20   shuffles feet, moves/turns too quickly   Has the patient had a decrease in activity level because of a fear of falling?  Yes    Is the patient reluctant to leave their home because of a fear of falling?  No      Home Environment   Living Environment Private residence    Living Arrangements Spouse/significant other    Type of Home House    Home Access Ramped entrance    Home Layout One level    Home Equipment Other (comment);Walker - 2 wheels;Grab bars - tub/shower   U-step Walker   Additional Comments has a Teacher, music at home, does not like to use it because it hurts his wrists. able to dress and bathe himself (is slow but is able to do it)      Prior Function   Level of Independence Independent with basic ADLs;Independent with household mobility with device    Leisure used to like to go fishing, being in the yard      Sensation   Light Touch Appears Intact      Coordination   Gross Motor Movements are Fluid and Coordinated No    Heel Shin Test harder to perform with LLE      Posture/Postural Control   Posture/Postural Control Postural limitations    Postural Limitations Rounded Shoulders;Forward head      ROM / Strength   AROM / PROM / Strength Strength      Strength   Overall Strength Comments grossly 5/5 BLE myotomes    Strength Assessment Site Hip;Knee;Ankle      Bed Mobility   Bed Mobility Supine to Sit;Sit to Supine    Supine to Sit Supervision/Verbal cueing    Sit to Supine Supervision/Verbal cueing   no issues with bed mobility from clinic mat table, pt's spouse reports that he has much more difficulty at home and their bed is higher     Transfers   Transfers Sit to Stand;Stand to Sit    Sit to Stand 4: Min guard;With upper extremity assist;From chair/3-in-1    Five time sit to stand comments  22.22 seconds from standard height chair, decr anterior weight shift, braces BLE against chair in standing, uncontrolled  descent,  needs cues to come all the way up to stand and let go of chair armrests    Stand to Sit 4: Min guard;With upper extremity assist;Uncontrolled descent;To chair/3-in-1    Comments pt needs cues to fully turn with RW to sit down to chair with BLE (does not have BLE against chair when sitting needing min guard for safety)      Ambulation/Gait   Ambulation/Gait Yes    Ambulation/Gait Assistance 4: Min guard;4: Min assist    Ambulation/Gait Assistance Details pt ambulating on his toes into session with standard RW, needing assist from therapist to help keep RW on the ground (pt leaning RW to R and lifting L side off the ground, esp when turning). pt demonstrating festination with gait and pt with tendency to move too quickly, pushing RW too far anteriorly, therapist providing min A to help keep RW close to pt's BOS and cued for slowed pace. freezing noted when pt would initiate a turn    Ambulation Distance (Feet) --   clinic distances   Assistive device Standard walker    Gait Pattern Step-through pattern;Step-to pattern;Decreased stride length;Decreased dorsiflexion - right;Decreased dorsiflexion - left;Shuffle;Festinating;Trunk flexed;Poor foot clearance - left;Poor foot clearance - right    Ambulation Surface Level;Indoor    Gait velocity 13.15 seconds = 2.49 ft/sec      Standardized Balance Assessment   Standardized Balance Assessment Timed Up and Go Test      Timed Up and Go Test   Normal TUG (seconds) 23.75    TUG Comments with RW                        Objective measurements completed on examination: See above findings.                PT Education - 01/29/21 0932     Education Details clinical findings, POC    Person(s) Educated Patient;Spouse    Methods Explanation    Comprehension Verbalized understanding              PT Short Term Goals - 01/29/21 0943       PT SHORT TERM GOAL #1   Title Pt will perform initial HEP with supervision  from pt's spouse for strength, balance, and transfers in order to build upon functional gains made in therapy. ALL STGS DUE 02/26/21    Time 4    Period Weeks    Status New    Target Date 02/26/21      PT SHORT TERM GOAL #2   Title Pt and pt's spouse will verbalize understanding of fall prevention in the home.    Time 4    Period Weeks    Status New      PT SHORT TERM GOAL #3   Title Pt and pt's spouse will verbalize understanding of techniques to help with freezing/festination episodes in order to demo improved safety with gait.    Time 4    Period Weeks    Status New      PT SHORT TERM GOAL #4   Title Pt will perform TUG with RW vs. UStep Walker in 21 seconds or less in order to demo decr fall risk.    Baseline 23.75 seconds    Time 4    Period Weeks    Status New      PT SHORT TERM GOAL #5   Title Pt will perform 5 reps of sit <> stand with BUE support with  proper technique and without BLE bracing in order to demo improved safety with functional transfers.    Time 4    Period Weeks    Status New               PT Long Term Goals - 01/29/21 0947       PT LONG TERM GOAL #1   Title Pt will perform final HEP with supervision from pt's spouse for strength, balance, and transfers in order to build upon functional gains made in therapy. ALL LTGS DUE 03/26/21    Time 8    Period Weeks    Status New    Target Date 03/26/21      PT LONG TERM GOAL #2   Title Pt and pt's spouse will verbalize understanding of local PD resources.    Time 8    Period Weeks    Status New      PT LONG TERM GOAL #3   Title Pt will improve gait speed with RW vs. Ustep walker to at least 2.7 ft/sec for improved community mobility.    Baseline 2.49 ft/sec    Time 8    Period Weeks    Status New      PT LONG TERM GOAL #4   Title Pt will perform 5x sit <> stand in 18 seconds or less with UE support in order to demo improved functional transfers and decr fall risk.    Baseline 22.22 seconds,  decr eccentric control    Time 8    Period Weeks    Status New      PT LONG TERM GOAL #5   Title Pt will perform TUG with RW vs. UStep Walker in 18.5 seconds or less in order to demo decr fall risk.    Baseline 23.75 seconds with RW    Time 8    Period Weeks    Status New      Additional Long Term Goals   Additional Long Term Goals Yes      PT LONG TERM GOAL #6   Title Pt will ambulate at least 230' with RW vs. UStep walker with supervision in order to demo improved household mobility.    Baseline needs min guard/min A with RW    Time 8    Period Weeks    Status New                    Plan - 01/29/21 0951     Clinical Impression Statement Patient is a 80 year old male referred to Neuro OPPT for PD (diagnosed in 2012). Pt has been to PT once, is currently going to [facility] 2x a week and using his seated recumbent bike at home. Pt ambulates in with RW and uses it at all times. Pt has a UStep Walker at home but does not like to use it due to it hurting his wrist. Pt has had approx. 20 falls in the past 20 months and tends to fall posteriorly or towards the R. Pt's PMH is significant for: PD (diagnosed 2012), CAD (s/p CABG 2006), LD, HTN, a fib, pacemaker, GERD, on anticoagulants.  The following deficits were present during the exam: decr timing/coordination of gait, abnormal posture, impaired balance, gait abnormalities (festination/freezing and shuffled steps), decr functional strength for transfers, decr safety awareness. Based on TUG, 5x sit <> stand; pt is an incr risk for falls. Pt's gait speed indicates that he is a limited community ambulator. Pt would benefit  from skilled PT to address these impairments and functional limitations to maximize functional mobility independence and decr fall risk.    Personal Factors and Comorbidities Comorbidity 3+;Time since onset of injury/illness/exacerbation;Past/Current Experience;Behavior Pattern    Comorbidities PD (diagnosed 2012), CAD (s/p  CABG 2006), LD, HTN, a fib, pacemaker, GERD, on anticoagulants    Examination-Activity Limitations Bed Mobility;Caring for Others;Toileting;Transfers;Stairs;Locomotion Level;Stand;Squat;Reach Overhead    Therapist, art;Yard Work    Conservation officer, historic buildings Evolving/Moderate complexity    Clinical Decision Making Moderate    Rehab Potential Good    PT Frequency 2x / week    PT Duration 12 weeks    PT Treatment/Interventions ADLs/Self Care Home Management;DME Instruction;Therapeutic activities;Functional mobility training;Gait training;Stair training;Therapeutic exercise;Balance training;Neuromuscular re-education;Patient/family education;Energy conservation;Vestibular    PT Next Visit Plan pt is very hard of hearing even with hearing aids (need to wear clear mask). work on sit <> stand training, initial HEP (might want to try seated PWR moves). gait training with RW or UStep (pt has one at home but does not use often due) - with working on slowed pace. educate on freezing/festination and techniques during gait.    Recommended Other Services OT and ST - will need to discuss at next session, waiting to hear if $30 co pay is per day or per discipline    Consulted and Agree with Plan of Care Patient;Family member/caregiver    Family Member Consulted pt's [name]             Patient will benefit from skilled therapeutic intervention in order to improve the following deficits and impairments:  Abnormal gait, Decreased activity tolerance, Decreased coordination, Decreased balance, Decreased endurance, Decreased knowledge of use of DME, Decreased safety awareness, Decreased mobility, Decreased strength, Difficulty walking, Impaired flexibility, Postural dysfunction  Visit Diagnosis: History of falling  Unsteadiness on feet  Other abnormalities of gait and mobility  Other symptoms and signs involving the nervous  system  Abnormal posture     Problem List Patient Active Problem List   Diagnosis Date Noted   HOH (hard of hearing) 12/18/2020   Personal history of colonic polyps 08/31/2016   Abnormality of gait 08/27/2014   Memory difficulty 08/27/2014   Atrial fibrillation (HCC) 09/08/2013   Cholelithiases 07/19/2013   Cholelithiasis 07/19/2013   Encounter for therapeutic drug monitoring 07/03/2013   Aortic valve replaced 04/05/2013   Parkinson disease (HCC) 09/27/2012   Orthostatic hypotension 04/01/2012   Pacemaker-[employer] 03/29/2012   HEPATITIS A 06/04/2010   HYPERTHYROIDISM 06/04/2010   HYPERLIPIDEMIA 06/04/2010   Essential hypertension 06/04/2010   CAD 06/04/2010   AORTIC STENOSIS 06/04/2010   SICK SINUS SYNDROME 06/04/2010   HIATAL HERNIA 06/04/2010   SHORTNESS OF BREATH 06/04/2010   CHEST PAIN 06/04/2010    Drake Leach, PT,DPT  01/29/2021, 10:02 AM   [facility] [address] [place], [place], [address] Phone: [phone]   Fax:  [phone]  Name: Donald [name] MRN: [id number] Date of Birth: 12-08-1940

## 2021-01-30 ENCOUNTER — Other Ambulatory Visit: Payer: Self-pay

## 2021-01-30 DIAGNOSIS — G20 Parkinson's disease: Secondary | ICD-10-CM

## 2021-01-30 MED ORDER — CARBIDOPA-LEVODOPA 25-100 MG PO TABS: ORAL_TABLET | ORAL | 3 refills | Status: DC

## 2021-02-03 ENCOUNTER — Telehealth: Payer: Self-pay

## 2021-02-03 DIAGNOSIS — G20A1 Parkinson's disease without dyskinesia, without mention of fluctuations: Secondary | ICD-10-CM

## 2021-02-03 DIAGNOSIS — G20 Parkinson's disease: Secondary | ICD-10-CM

## 2021-02-03 MED ORDER — CARBIDOPA-LEVODOPA 25-100 MG PO TABS: ORAL_TABLET | ORAL | 3 refills | Status: DC

## 2021-02-03 NOTE — Telephone Encounter (Signed)
Pt wife called in regarding husbands refill on carbidopa/levo.  [place] Spaniel he has enough for today and that's it. It needs to [facility].

## 2021-02-03 NOTE — Telephone Encounter (Signed)
Called patients [name] back and let her know I called [facility] and did a verbal order because I had sent the refill on 8-31 and 9-15 as well as today. I spoke to [place] and we were able to fil that prescription today for him so he would not run out. Patients [name] was very happy that it was handled for her and her husband today

## 2021-02-11 ENCOUNTER — Encounter: Payer: Self-pay | Admitting: Physical Therapy

## 2021-02-11 ENCOUNTER — Other Ambulatory Visit: Payer: Self-pay

## 2021-02-11 ENCOUNTER — Ambulatory Visit: Payer: PPO | Admitting: Physical Therapy

## 2021-02-11 DIAGNOSIS — Z9181 History of falling: Secondary | ICD-10-CM | POA: Diagnosis not present

## 2021-02-11 DIAGNOSIS — R2681 Unsteadiness on feet: Secondary | ICD-10-CM

## 2021-02-11 DIAGNOSIS — R29818 Other symptoms and signs involving the nervous system: Secondary | ICD-10-CM

## 2021-02-11 DIAGNOSIS — R2689 Other abnormalities of gait and mobility: Secondary | ICD-10-CM

## 2021-02-11 NOTE — Therapy (Signed)
[facility] [address] [place], [place], [address] Phone: [phone]   Fax:  [phone]  Physical Therapy Treatment  Patient Details  Name: Donald [name] MRN: [id number] Date of Birth: December 04, 1940 Referring Provider (PT): Dr. Arbutus Leas   Encounter Date: 02/11/2021   PT End of Session - 02/11/21 1514     Visit Number 2    Number of Visits 17    Date for PT Re-Evaluation 04/29/21    Authorization Type HTA - $30 co pay    PT Start Time 1018    PT Stop Time 1100    PT Time Calculation (min) 42 min    Equipment Utilized During Treatment Gait belt    Activity Tolerance Patient tolerated treatment well    Behavior During Therapy [facility] for tasks assessed/performed             Past Medical History:  Diagnosis Date   Abnormality of gait 08/27/2014   Anxiety    Aortic stenosis    s/p AVR by Dr Laneta Simmers   Arthritis    left wrist- probable- not diagonised   Asthma    in the past   CAD (coronary artery disease)    s/p CABG 2006   GERD (gastroesophageal reflux disease)    H/O seasonal allergies    H/O: rheumatic fever    Hearing deficit    Bilateral hearing aids   Hematuria    had CT scan   Hiatal hernia    History of kidney stones    HOH (hard of hearing)    Hyperlipidemia    Hypertension    Hyperthyroidism    Inguinal hernia    right side; watching at this time per wife's report   Memory difficulty 08/27/2014   Pacemaker    Parkinson's disease    Persistent atrial fibrillation (HCC)    Scarlet fever    as a child   Shortness of breath    Sick sinus syndrome (HCC)    s/p PPM (MDT) 2006    Past Surgical History:  Procedure Laterality Date   AORTIC VALVE REPLACEMENT  2006   CARDIOVERSION N/A 09/14/2013   Procedure: CARDIOVERSION;  Surgeon: Thurmon Fair, MD;  Location: [facility] ENDOSCOPY;  Service: Cardiovascular;  Laterality: N/A;   CHOLECYSTECTOMY N/A 07/21/2013   Procedure: LAPAROSCOPIC CHOLECYSTECTOMY;   Surgeon: Mariella Saa, MD;  Location: [facility] OR;  Service: General;  Laterality: N/A;   COLONOSCOPY W/ POLYPECTOMY     COLONOSCOPY WITH PROPOFOL N/A 08/31/2016   Procedure: COLONOSCOPY WITH PROPOFOL;  Surgeon: Charlott Rakes, MD;  Location[facility] ENDOSCOPY;  Service: Endoscopy;  Laterality: N/A;   CORONARY ARTERY BYPASS GRAFT  2006   DENTAL SURGERY     implants   INSERT / REPLACE / REMOVE PACEMAKER  2006, 2012   MDT for sick sinus syndrome, gen change 8/12 by JA   KIDNEY STONE SURGERY  1984    There were no vitals filed for this visit.   Subjective Assessment - 02/11/21 1024     Subjective Had a fall when he was out in the yard - walking in the grass. Wife reports that he gets more shaky at night time.    Pertinent History PMH: PD, CAD (s/p CABG 2006), LD, HTN, a fib, pacemaker, GERD, on anticoagulants    Limitations Walking;House hold activities;Standing    Patient Stated Goals wants to improve his balance    Currently in Pain? No/denies  [facility] Adult PT Treatment/Exercise - 02/11/21 1038       Transfers   Transfers Sit to Stand;Stand to Sit    Sit to Stand 4: Min guard;With upper extremity assist;From chair/3-in-1    Stand to Sit 4: Min guard;With upper extremity assist;Uncontrolled descent;To chair/3-in-1    Comments x10 reps sit <> stands from standard height chair, needs cues for slowed pace, scooting towards edge, foot placement, proper UE placement and forward weight shift. Cues for tall posture in standing. Plus additional reps throughout session. Pt's spouse present for education on technique, pt with goodcarryover with incr reps and able to perform without verbal cues from therapist. Added to pt's HEP for pt to perform with his spouse present      Ambulation/Gait   Ambulation/Gait Yes    Ambulation/Gait Assistance 4: Min guard;4: Min assist    Ambulation/Gait Assistance Details With RW - cues for stride length (taking big  steps). Tried to demo it to pt before as he is hard of hearing. Pt with short shuffled steps, esp during turns and episodes of festination/freezing during gait. In quiet hallway, pt able to take a few steps of incr step length, but then reverts to smaller steps with toe contact first. Educated importance and rationale of big movements/big steps with PD    Ambulation Distance (Feet) 60 Feet   x2   Assistive device Rolling walker    Gait Pattern Step-through pattern;Step-to pattern;Decreased stride length;Decreased dorsiflexion - right;Decreased dorsiflexion - left;Shuffle;Festinating;Trunk flexed;Poor foot clearance - left;Poor foot clearance - right    Ambulation Surface Level;Indoor      Neuro Re-ed    Neuro Re-ed Details  seated forward flexion and back up to tall posture 2 x 10 reps with demo and tactile cues for technique; x10 reps heel <> toe raises, marching x10 reps bilat woth visual cue on intensity of march, single step out and in x10 reps bilat - pt with incr difficulty coordinating movement. x10 reps bilat LAQs with visual target of boomwhacker on how high to kick leg             Access Code: [email or web address] URL: [email or web address] Date: 02/11/2021 Prepared by: Sherlie Ban  Exercises Seated Active Hip Flexion - 2 x daily - 5 x weekly - 1-2 sets - 10 reps        PT Education - 02/11/21 1514     Education Details initial HEP - sit <> stand training    Person(s) Educated Patient;Spouse    Methods Explanation;Handout;Demonstration;Verbal cues    Comprehension Verbalized understanding;Returned demonstration              PT Short Term Goals - 01/29/21 0943       PT SHORT TERM GOAL #1   Title Pt will perform initial HEP with supervision from pt's spouse for strength, balance, and transfers in order to build upon functional gains made in therapy. ALL STGS DUE 02/26/21    Time 4    Period Weeks    Status New    Target Date 02/26/21      PT SHORT TERM  GOAL #2   Title Pt and pt's spouse will verbalize understanding of fall prevention in the home.    Time 4    Period Weeks    Status New      PT SHORT TERM GOAL #3   Title Pt and pt's spouse will verbalize understanding of techniques to help with freezing/festination episodes in order to demo improved safety with gait.  Time 4    Period Weeks    Status New      PT SHORT TERM GOAL #4   Title Pt will perform TUG with RW vs. UStep Walker in 21 seconds or less in order to demo decr fall risk.    Baseline 23.75 seconds    Time 4    Period Weeks    Status New      PT SHORT TERM GOAL #5   Title Pt will perform 5 reps of sit <> stand with BUE support with proper technique and without BLE bracing in order to demo improved safety with functional transfers.    Time 4    Period Weeks    Status New               PT Long Term Goals - 01/29/21 0947       PT LONG TERM GOAL #1   Title Pt will perform final HEP with supervision from pt's spouse for strength, balance, and transfers in order to build upon functional gains made in therapy. ALL LTGS DUE 03/26/21    Time 8    Period Weeks    Status New    Target Date 03/26/21      PT LONG TERM GOAL #2   Title Pt and pt's spouse will verbalize understanding of local PD resources.    Time 8    Period Weeks    Status New      PT LONG TERM GOAL #3   Title Pt will improve gait speed with RW vs. Ustep walker to at least 2.7 ft/sec for improved community mobility.    Baseline 2.49 ft/sec    Time 8    Period Weeks    Status New      PT LONG TERM GOAL #4   Title Pt will perform 5x sit <> stand in 18 seconds or less with UE support in order to demo improved functional transfers and decr fall risk.    Baseline 22.22 seconds, decr eccentric control    Time 8    Period Weeks    Status New      PT LONG TERM GOAL #5   Title Pt will perform TUG with RW vs. UStep Walker in 18.5 seconds or less in order to demo decr fall risk.    Baseline  23.75 seconds with RW    Time 8    Period Weeks    Status New      Additional Long Term Goals   Additional Long Term Goals Yes      PT LONG TERM GOAL #6   Title Pt will ambulate at least 230' with RW vs. UStep walker with supervision in order to demo improved household mobility.    Baseline needs min guard/min A with RW    Time 8    Period Weeks    Status New                   Plan - 02/11/21 1517     Clinical Impression Statement Worked on sit <> stand transfers with RW and standard height chair with pt needing cues for proper technique/sequencing and for slowed pace. Initially when pt stands in waiting room, pt with decr forward weight shift and BLE bracing against mat table. With incr reps, pt does demonstrate slower pace and proper technique. Added to HEP for pt to perform with his spouse. Began to initiate gait training with taking longer/bigger steps. Pt still with smaller,  shuffled steps and an episode of festinating when leaving therapy gym into a busy hallway. Will continue to progress towards LTGs.    Personal Factors and Comorbidities Comorbidity 3+;Time since onset of injury/illness/exacerbation;Past/Current Experience;Behavior Pattern    Comorbidities PD (diagnosed 2012), CAD (s/p CABG 2006), LD, HTN, a fib, pacemaker, GERD, on anticoagulants    Examination-Activity Limitations Bed Mobility;Caring for Others;Toileting;Transfers;Stairs;Locomotion Level;Stand;Squat;Reach Overhead    Therapist, art;Yard Work    Conservation officer, historic buildings Evolving/Moderate complexity    Rehab Potential Good    PT Frequency 2x / week    PT Duration 12 weeks    PT Treatment/Interventions ADLs/Self Care Home Management;DME Instruction;Therapeutic activities;Functional mobility training;Gait training;Stair training;Therapeutic exercise;Balance training;Neuromuscular re-education;Patient/family education;Energy  conservation;Vestibular    PT Next Visit Plan pt is very hard of hearing even with hearing aids (need to wear clear mask). continue work on sit <> stand training, add to HEP as appropriate. gait training with RW or UStep (pt has one at home but does not use often due) - with working on slowed pace. educate on freezing/festination and techniques during gait.    Consulted and Agree with Plan of Care Patient;Family member/caregiver    Family Member Consulted pt's [name]             Patient will benefit from skilled therapeutic intervention in order to improve the following deficits and impairments:  Abnormal gait, Decreased activity tolerance, Decreased coordination, Decreased balance, Decreased endurance, Decreased knowledge of use of DME, Decreased safety awareness, Decreased mobility, Decreased strength, Difficulty walking, Impaired flexibility, Postural dysfunction  Visit Diagnosis: History of falling  Unsteadiness on feet  Other abnormalities of gait and mobility  Other symptoms and signs involving the nervous system     Problem List Patient Active Problem List   Diagnosis Date Noted   HOH (hard of hearing) 12/18/2020   Personal history of colonic polyps 08/31/2016   Abnormality of gait 08/27/2014   Memory difficulty 08/27/2014   Atrial fibrillation (HCC) 09/08/2013   Cholelithiases 07/19/2013   Cholelithiasis 07/19/2013   Encounter for therapeutic drug monitoring 07/03/2013   Aortic valve replaced 04/05/2013   Parkinson disease (HCC) 09/27/2012   Orthostatic hypotension 04/01/2012   Pacemaker-[employer] 03/29/2012   HEPATITIS A 06/04/2010   HYPERTHYROIDISM 06/04/2010   HYPERLIPIDEMIA 06/04/2010   Essential hypertension 06/04/2010   CAD 06/04/2010   AORTIC STENOSIS 06/04/2010   SICK SINUS SYNDROME 06/04/2010   HIATAL HERNIA 06/04/2010   SHORTNESS OF BREATH 06/04/2010   CHEST PAIN 06/04/2010    Drake Leach, PT, DPT  02/11/2021, 3:19 PM  [facility] [address] [place], [place], [address] Phone: [phone]   Fax:  [phone]  Name: Donald [name] MRN: [id number] Date of Birth: 11/18/1940

## 2021-02-11 NOTE — Patient Instructions (Signed)
Sit to Stand Transfers:  Scoot out to the edge of the chair Place your feet flat on the floor, shoulder width apart.  Make sure your feet are tucked just under your knees. Lean forward (nose over toes) with momentum, and stand up tall with your best posture.  If you need to use your arms, use them as a quick boost up to stand. If you are in a low or soft chair, you can lean back and then forward up to stand, in order to get more momentum. Once you are standing, make sure you are looking ahead and standing tall.  To sit down:  Back up until you feel the chair behind your legs. Bend at you hips, reaching  Back for you chair, if needed, then slowly squat to sit down on your chair.  Access Code: [email or web address] URL: [email or web address] Date: 02/11/2021 Prepared by: Sherlie Ban  Exercises Seated Active Hip Flexion - 2 x daily - 5 x weekly - 1-2 sets - 10 reps

## 2021-02-13 ENCOUNTER — Ambulatory Visit: Payer: PPO | Admitting: Physical Therapy

## 2021-02-13 ENCOUNTER — Other Ambulatory Visit: Payer: Self-pay

## 2021-02-13 ENCOUNTER — Encounter: Payer: Self-pay | Admitting: Physical Therapy

## 2021-02-13 DIAGNOSIS — Z9181 History of falling: Secondary | ICD-10-CM | POA: Diagnosis not present

## 2021-02-13 DIAGNOSIS — R2681 Unsteadiness on feet: Secondary | ICD-10-CM

## 2021-02-13 DIAGNOSIS — R2689 Other abnormalities of gait and mobility: Secondary | ICD-10-CM

## 2021-02-13 DIAGNOSIS — R29818 Other symptoms and signs involving the nervous system: Secondary | ICD-10-CM

## 2021-02-13 NOTE — Patient Instructions (Signed)
Tips to reduce freezing episodes with standing or walking:  Stand tall with your feet wide. Don't try to fight the freeze: if you begin taking slower, faster, smaller steps, STOP, get your posture tall, and RESET your posture and balance.  Take a deep breath before taking the BIG step to start again. As you approach where your destination with walking, count your steps out loud and/or focus on your target with your eyes until you are fully there. Use appropriate assistive device, as advised by your physical therapist to assist with taking longer, consistent steps.

## 2021-02-14 ENCOUNTER — Telehealth: Payer: Self-pay | Admitting: Physical Therapy

## 2021-02-14 ENCOUNTER — Other Ambulatory Visit: Payer: Self-pay

## 2021-02-14 DIAGNOSIS — K5730 Diverticulosis of large intestine without perforation or abscess without bleeding: Secondary | ICD-10-CM | POA: Diagnosis not present

## 2021-02-14 DIAGNOSIS — G20 Parkinson's disease: Secondary | ICD-10-CM

## 2021-02-14 DIAGNOSIS — I4821 Permanent atrial fibrillation: Secondary | ICD-10-CM | POA: Diagnosis not present

## 2021-02-14 DIAGNOSIS — N401 Enlarged prostate with lower urinary tract symptoms: Secondary | ICD-10-CM | POA: Diagnosis not present

## 2021-02-14 DIAGNOSIS — E7800 Pure hypercholesterolemia, unspecified: Secondary | ICD-10-CM | POA: Diagnosis not present

## 2021-02-14 DIAGNOSIS — R82998 Other abnormal findings in urine: Secondary | ICD-10-CM | POA: Diagnosis not present

## 2021-02-14 DIAGNOSIS — I119 Hypertensive heart disease without heart failure: Secondary | ICD-10-CM | POA: Diagnosis not present

## 2021-02-14 DIAGNOSIS — E039 Hypothyroidism, unspecified: Secondary | ICD-10-CM | POA: Diagnosis not present

## 2021-02-14 DIAGNOSIS — Z952 Presence of prosthetic heart valve: Secondary | ICD-10-CM | POA: Diagnosis not present

## 2021-02-14 DIAGNOSIS — D6869 Other thrombophilia: Secondary | ICD-10-CM | POA: Diagnosis not present

## 2021-02-14 DIAGNOSIS — I25810 Atherosclerosis of coronary artery bypass graft(s) without angina pectoris: Secondary | ICD-10-CM | POA: Diagnosis not present

## 2021-02-14 DIAGNOSIS — Z7901 Long term (current) use of anticoagulants: Secondary | ICD-10-CM | POA: Diagnosis not present

## 2021-02-14 DIAGNOSIS — D692 Other nonthrombocytopenic purpura: Secondary | ICD-10-CM | POA: Diagnosis not present

## 2021-02-14 DIAGNOSIS — Z950 Presence of cardiac pacemaker: Secondary | ICD-10-CM | POA: Diagnosis not present

## 2021-02-14 DIAGNOSIS — R7989 Other specified abnormal findings of blood chemistry: Secondary | ICD-10-CM | POA: Diagnosis not present

## 2021-02-14 DIAGNOSIS — D509 Iron deficiency anemia, unspecified: Secondary | ICD-10-CM | POA: Diagnosis not present

## 2021-02-14 DIAGNOSIS — H900 Conductive hearing loss, bilateral: Secondary | ICD-10-CM | POA: Diagnosis not present

## 2021-02-14 NOTE — Progress Notes (Signed)
[facility]-

## 2021-02-14 NOTE — Therapy (Signed)
[facility] [address] [place], [place], [address] Phone: [phone]   Fax:  [phone]  Physical Therapy Treatment  Patient Details  Name: Donald [name] MRN: [id number] Date of Birth: 08-Dec-1940 Referring Provider (PT): Dr. Arbutus Leas   Encounter Date: 02/13/2021   PT End of Session - 02/14/21 0945     Visit Number 3    Number of Visits 17    Date for PT Re-Evaluation 04/29/21    Authorization Type HTA - $30 co pay    PT Start Time 0931    PT Stop Time 1017    PT Time Calculation (min) 46 min    Equipment Utilized During Treatment Gait belt    Activity Tolerance Patient tolerated treatment well    Behavior During Therapy [facility] for tasks assessed/performed             Past Medical History:  Diagnosis Date   Abnormality of gait 08/27/2014   Anxiety    Aortic stenosis    s/p AVR by Dr Laneta Simmers   Arthritis    left wrist- probable- not diagonised   Asthma    in the past   CAD (coronary artery disease)    s/p CABG 2006   GERD (gastroesophageal reflux disease)    H/O seasonal allergies    H/O: rheumatic fever    Hearing deficit    Bilateral hearing aids   Hematuria    had CT scan   Hiatal hernia    History of kidney stones    HOH (hard of hearing)    Hyperlipidemia    Hypertension    Hyperthyroidism    Inguinal hernia    right side; watching at this time per wife's report   Memory difficulty 08/27/2014   Pacemaker    Parkinson's disease    Persistent atrial fibrillation (HCC)    Scarlet fever    as a child   Shortness of breath    Sick sinus syndrome (HCC)    s/p PPM (MDT) 2006    Past Surgical History:  Procedure Laterality Date   AORTIC VALVE REPLACEMENT  2006   CARDIOVERSION N/A 09/14/2013   Procedure: CARDIOVERSION;  Surgeon: Thurmon Fair, MD;  Location: [facility] ENDOSCOPY;  Service: Cardiovascular;  Laterality: N/A;   CHOLECYSTECTOMY N/A 07/21/2013   Procedure: LAPAROSCOPIC CHOLECYSTECTOMY;   Surgeon: Mariella Saa, MD;  Location: [facility] OR;  Service: General;  Laterality: N/A;   COLONOSCOPY W/ POLYPECTOMY     COLONOSCOPY WITH PROPOFOL N/A 08/31/2016   Procedure: COLONOSCOPY WITH PROPOFOL;  Surgeon: Charlott Rakes, MD;  Location: [facility] ENDOSCOPY;  Service: Endoscopy;  Laterality: N/A;   CORONARY ARTERY BYPASS GRAFT  2006   DENTAL SURGERY     implants   INSERT / REPLACE / REMOVE PACEMAKER  2006, 2012   MDT for sick sinus syndrome, gen change 8/12 by JA   KIDNEY STONE SURGERY  1984    There were no vitals filed for this visit.   Subjective Assessment - 02/13/21 0937     Subjective No falls, no changes. Has been working on his exercises.    Pertinent History PMH: PD, CAD (s/p CABG 2006), LD, HTN, a fib, pacemaker, GERD, on anticoagulants    Limitations Walking;House hold activities;Standing    Patient Stated Goals wants to improve his balance    Currently in Pain? No/denies  [facility] Adult PT Treatment/Exercise - 02/13/21 0937       Transfers   Transfers Sit to Stand;Stand to Sit    Sit to Stand 4: Min guard;With upper extremity assist;From chair/3-in-1    Stand to Sit 4: Min guard;With upper extremity assist;Uncontrolled descent;To chair/3-in-1    Comments x10 reps sit <> stands from mat table - reviewed technique from last session. Pt able to demo good understanding, but does intermittently needs cues for remainder of sit <> stands throughout session for incr forward weight shift. When turning with RW, takes small shuffled steps back to mat table      Ambulation/Gait   Ambulation/Gait Yes    Ambulation/Gait Assistance 4: Min guard;4: Min assist    Ambulation/Gait Assistance Details With RW- cued pt to take bigger steps/"kick" legs out for incr stride length, also provided demo cues/wrote down instructions on sticky note as pt is hard of hearing. Performed x1 lap with pt able to perform for a few steps before reverting back  to festinating pattern and being outside of BOS. During last 2 laps used visual cue of red tband at bottom of RW to kick legs out to the band for incr stride length and for pt to say better inside of BOS of RW. Pt able to incr stride length, but does continue with festination epsiodes with needing cues to stop and reset before continuing gait. Pt does seem to do better with use of visual cue, provided it on pt's RW for home use and educated pt's spouse for pt to try to take big steps up to the red band.    Ambulation Distance (Feet) 115 Feet   x2, 80 x 1   Assistive device Rolling walker    Gait Pattern Step-through pattern;Step-to pattern;Decreased stride length;Decreased dorsiflexion - right;Decreased dorsiflexion - left;Shuffle;Festinating;Trunk flexed;Poor foot clearance - left;Poor foot clearance - right    Ambulation Surface Level;Indoor      Therapeutic Activites    Therapeutic Activities Other Therapeutic Activities    Other Therapeutic Activities Provided handout on tips to reduce freezing/festination episodes during gait and practiced with patient/pt's spouse during gait- Cues  to STOP, reset with tall posture, take a deep breath before taking a big step to continue with gait.      Neuro Re-ed    Neuro Re-ed Details  Seated forward flexion and back up to tall posture x10 reps, marching x10 reps bilat with visual cue on intensity of march, single step out and in over yard stick as visual cue 2 x 10 reps bilat, does need cues to slow down pace. Cues for "stomping" for incr intensity with marching.                     PT Education - 02/14/21 0940     Education Details Tips to reduce freezing/festination episodes, use of red tband around RW to stay inside BOS of RW and taking bigger steps    Person(s) Educated Patient;Spouse    Methods Explanation;Demonstration;Verbal cues;Handout   demonstrative cues   Comprehension Verbalized understanding;Returned demonstration;Verbal cues  required;Need further instruction              PT Short Term Goals - 01/29/21 0943       PT SHORT TERM GOAL #1   Title Pt will perform initial HEP with supervision from pt's spouse for strength, balance, and transfers in order to build upon functional gains made in therapy. ALL STGS DUE 02/26/21    Time 4  Period Weeks    Status New    Target Date 02/26/21      PT SHORT TERM GOAL #2   Title Pt and pt's spouse will verbalize understanding of fall prevention in the home.    Time 4    Period Weeks    Status New      PT SHORT TERM GOAL #3   Title Pt and pt's spouse will verbalize understanding of techniques to help with freezing/festination episodes in order to demo improved safety with gait.    Time 4    Period Weeks    Status New      PT SHORT TERM GOAL #4   Title Pt will perform TUG with RW vs. UStep Walker in 21 seconds or less in order to demo decr fall risk.    Baseline 23.75 seconds    Time 4    Period Weeks    Status New      PT SHORT TERM GOAL #5   Title Pt will perform 5 reps of sit <> stand with BUE support with proper technique and without BLE bracing in order to demo improved safety with functional transfers.    Time 4    Period Weeks    Status New               PT Long Term Goals - 01/29/21 0947       PT LONG TERM GOAL #1   Title Pt will perform final HEP with supervision from pt's spouse for strength, balance, and transfers in order to build upon functional gains made in therapy. ALL LTGS DUE 03/26/21    Time 8    Period Weeks    Status New    Target Date 03/26/21      PT LONG TERM GOAL #2   Title Pt and pt's spouse will verbalize understanding of local PD resources.    Time 8    Period Weeks    Status New      PT LONG TERM GOAL #3   Title Pt will improve gait speed with RW vs. Ustep walker to at least 2.7 ft/sec for improved community mobility.    Baseline 2.49 ft/sec    Time 8    Period Weeks    Status New      PT LONG TERM GOAL  #4   Title Pt will perform 5x sit <> stand in 18 seconds or less with UE support in order to demo improved functional transfers and decr fall risk.    Baseline 22.22 seconds, decr eccentric control    Time 8    Period Weeks    Status New      PT LONG TERM GOAL #5   Title Pt will perform TUG with RW vs. UStep Walker in 18.5 seconds or less in order to demo decr fall risk.    Baseline 23.75 seconds with RW    Time 8    Period Weeks    Status New      Additional Long Term Goals   Additional Long Term Goals Yes      PT LONG TERM GOAL #6   Title Pt will ambulate at least 230' with RW vs. UStep walker with supervision in order to demo improved household mobility.    Baseline needs min guard/min A with RW    Time 8    Period Weeks    Status New  Plan - 02/14/21 1006     Clinical Impression Statement Pt demonstrated good understanding of sit <> stand technique today when getting up from waiting room chair and during session. Did need a couple cues throughout for incr weight shift. Worked on gait training with RW with taking bigger steps and staying inside BOS of RW, due to pt being very hard of hearing, pt seemed to do best today with use of red tband as visual cue to step to for longer stride length. Will continue to practice and determine which cues work best for patient. Provided initial education to pt and pt's spouse on tips to help with festination/freezing episodes. Will continue to benefit from further practice. Will continue to progress towards LTGs.    Personal Factors and Comorbidities Comorbidity 3+;Time since onset of injury/illness/exacerbation;Past/Current Experience;Behavior Pattern    Comorbidities PD (diagnosed 2012), CAD (s/p CABG 2006), LD, HTN, a fib, pacemaker, GERD, on anticoagulants    Examination-Activity Limitations Bed Mobility;Caring for Others;Toileting;Transfers;Stairs;Locomotion Level;Stand;Squat;Reach Overhead     Therapist, art;Yard Work    Conservation officer, historic buildings Evolving/Moderate complexity    Rehab Potential Good    PT Frequency 2x / week    PT Duration 12 weeks    PT Treatment/Interventions ADLs/Self Care Home Management;DME Instruction;Therapeutic activities;Functional mobility training;Gait training;Stair training;Therapeutic exercise;Balance training;Neuromuscular re-education;Patient/family education;Energy conservation;Vestibular    PT Next Visit Plan Pt is very hard of hearing even with hearing aids (need to wear clear mask). Review techniques for freezing/festination and practice during gait. Gait training with RW - working on cues for incr stride length and working on slowed pace, pt seemed to do well with use of tband as visual cue. Try UStep (pt has one at home but does not use often due to it hurting his hands when he has to hold the brakes). Add to HEP as appropriate.    Recommended Other Services OT and ST - [place] stills needs to verify copay but thinks it is per day vs. discipline.    Consulted and Agree with Plan of Care Patient;Family member/caregiver    Family Member Consulted pt's [name]             Patient will benefit from skilled therapeutic intervention in order to improve the following deficits and impairments:  Abnormal gait, Decreased activity tolerance, Decreased coordination, Decreased balance, Decreased endurance, Decreased knowledge of use of DME, Decreased safety awareness, Decreased mobility, Decreased strength, Difficulty walking, Impaired flexibility, Postural dysfunction  Visit Diagnosis: History of falling  Unsteadiness on feet  Other abnormalities of gait and mobility  Other symptoms and signs involving the nervous system     Problem List Patient Active Problem List   Diagnosis Date Noted   HOH (hard of hearing) 12/18/2020   Personal history of colonic polyps  08/31/2016   Abnormality of gait 08/27/2014   Memory difficulty 08/27/2014   Atrial fibrillation (HCC) 09/08/2013   Cholelithiases 07/19/2013   Cholelithiasis 07/19/2013   Encounter for therapeutic drug monitoring 07/03/2013   Aortic valve replaced 04/05/2013   Parkinson disease (HCC) 09/27/2012   Orthostatic hypotension 04/01/2012   Pacemaker-[employer]tr[employer] 03/29/2012   HEPATITIS A 06/04/2010   HYPERTHYROIDISM 06/04/2010   HYPERLIPIDEMIA 06/04/2010   Essential hypertension 06/04/2010   CAD 06/04/2010   AORTIC STENOSIS 06/04/2010   SICK SINUS SYNDROME 06/04/2010   HIATAL HERNIA 06/04/2010   SHORTNESS OF BREATH 06/04/2010   CHEST PAIN 06/04/2010    Drake Leach, PT, DPT  02/14/2021, 10:12 AM  [facility] [address] [place], [place], [address] Phone: [phone]   Fax:  [phone]  Name: Donald [name] MRN: [id number] Date of Birth: [birth year]

## 2021-02-14 NOTE — Telephone Encounter (Signed)
Dr. Edwena Felty. Donald [name] was evaluated by Physical Therapy at [facility]. The patient would benefit from an OT and Speech Therapy referral for PD related impairments. If you agree, please place an order in [facility] workque in [facility] or fax the order to [phone]. Thank you, Sherlie Ban, PT, DPT 02/14/21 9:49 AM    Ne[facility]habilitation Center [address] [place], [place]  [address] Phone:  [phone] Fax:  [phone]

## 2021-02-14 NOTE — Telephone Encounter (Signed)
Both referral sent for OT and speech Therapy for patient

## 2021-02-18 ENCOUNTER — Other Ambulatory Visit: Payer: Self-pay

## 2021-02-18 ENCOUNTER — Encounter: Payer: Self-pay | Admitting: Physical Therapy

## 2021-02-18 ENCOUNTER — Ambulatory Visit: Payer: PPO | Attending: Neurology | Admitting: Physical Therapy

## 2021-02-18 DIAGNOSIS — R251 Tremor, unspecified: Secondary | ICD-10-CM | POA: Diagnosis not present

## 2021-02-18 DIAGNOSIS — R471 Dysarthria and anarthria: Secondary | ICD-10-CM | POA: Insufficient documentation

## 2021-02-18 DIAGNOSIS — R2681 Unsteadiness on feet: Secondary | ICD-10-CM | POA: Diagnosis not present

## 2021-02-18 DIAGNOSIS — R2689 Other abnormalities of gait and mobility: Secondary | ICD-10-CM | POA: Diagnosis not present

## 2021-02-18 DIAGNOSIS — R41842 Visuospatial deficit: Secondary | ICD-10-CM | POA: Insufficient documentation

## 2021-02-18 DIAGNOSIS — R278 Other lack of coordination: Secondary | ICD-10-CM | POA: Insufficient documentation

## 2021-02-18 DIAGNOSIS — R29818 Other symptoms and signs involving the nervous system: Secondary | ICD-10-CM | POA: Diagnosis not present

## 2021-02-18 DIAGNOSIS — R293 Abnormal posture: Secondary | ICD-10-CM | POA: Insufficient documentation

## 2021-02-18 DIAGNOSIS — R1312 Dysphagia, oropharyngeal phase: Secondary | ICD-10-CM | POA: Insufficient documentation

## 2021-02-18 NOTE — Therapy (Signed)
[facility] [address] [place], [place], [address] Phone: [phone]   Fax:  [phone]  Physical Therapy Treatment  Patient Details  Name: Donald [name] MRN: [id number] Date of Birth: 1940-12-30 Referring Provider (PT): Dr. Arbutus Leas   Encounter Date: 02/18/2021   PT End of Session - 02/18/21 1458     Visit Number 4    Number of Visits 17    Date for PT Re-Evaluation 04/29/21    Authorization Type HTA - $30 co pay    PT Start Time 1322    PT Stop Time 1406    PT Time Calculation (min) 44 min    Equipment Utilized During Treatment --    Activity Tolerance Patient tolerated treatment well    Behavior During Therapy [facility] for tasks assessed/performed             Past Medical History:  Diagnosis Date   Abnormality of gait 08/27/2014   Anxiety    Aortic stenosis    s/p AVR by Dr Laneta Simmers   Arthritis    left wrist- probable- not diagonised   Asthma    in the past   CAD (coronary artery disease)    s/p CABG 2006   GERD (gastroesophageal reflux disease)    H/O seasonal allergies    H/O: rheumatic fever    Hearing deficit    Bilateral hearing aids   Hematuria    had CT scan   Hiatal hernia    History of kidney stones    HOH (hard of hearing)    Hyperlipidemia    Hypertension    Hyperthyroidism    Inguinal hernia    right side; watching at this time per wife's report   Memory difficulty 08/27/2014   Pacemaker    Parkinson's disease    Persistent atrial fibrillation (HCC)    Scarlet fever    as a child   Shortness of breath    Sick sinus syndrome (HCC)    s/p PPM (MDT) 2006    Past Surgical History:  Procedure Laterality Date   AORTIC VALVE REPLACEMENT  2006   CARDIOVERSION N/A 09/14/2013   Procedure: CARDIOVERSION;  Surgeon: Thurmon Fair, MD;  [facility]: [facility] ENDOSCOPY;  Service: Cardiovascular;  Laterality: N/A;   CHOLECYSTECTOMY N/A 07/21/2013   Procedure: LAPAROSCOPIC CHOLECYSTECTOMY;  Surgeon:  Mariella Saa, MD;  Location: [facility] OR;  Service: General;  Laterality: N/A;   COLONOSCOPY W/ POLYPECTOMY     COLONOSCOPY WITH PROPOFOL N/A 08/31/2016   Procedure: COLONOSCOPY WITH PROPOFOL;  Surgeon: Charlott Rakes, MD;  Location: [facility] ENDOSCOPY;  Service: Endoscopy;  Laterality: N/A;   CORONARY ARTERY BYPASS GRAFT  2006   DENTAL SURGERY     implants   INSERT / REPLACE / REMOVE PACEMAKER  2006, 2012   MDT for sick sinus syndrome, gen change 8/12 by JA   KIDNEY STONE SURGERY  1984    There were no vitals filed for this visit.   Subjective Assessment - 02/18/21 1329     Subjective Fell on Saturday, messing with the lawnmower, but didn't get hurt.    Pertinent History PMH: PD, CAD (s/p CABG 2006), LD, HTN, a fib, pacemaker, GERD, on anticoagulants    Limitations Walking;House hold activities;Standing    Patient Stated Goals wants to improve his balance    Currently in Pain? No/denies  [facility] Adult PT Treatment/Exercise - 02/18/21 0001       Transfers   Transfers Sit to Stand;Stand to Sit    Sit to Stand 4: Min guard;With upper extremity assist;From chair/3-in-1    Stand to Sit 4: Min guard;With upper extremity assist;To chair/3-in-1    Comments x10 reps sit <> stands from mat table - reviewed technique from previous sessions. Pt able to demo good understanding, but does intermittently needs cues for remainder of sit <> stands throughout session for steps for transfer, especially for incr forward weight shift. When turning with RW, takes small shuffled steps back to chair, needing cues to fully turn prior to sitting.      Ambulation/Gait   Ambulation/Gait Yes    Ambulation/Gait Assistance 4: Min guard;4: Min assist    Ambulation/Gait Assistance Details Trialed U-step rollator, with increased episodes of festination, as pt continues to hold handles so rollator is moving and he goes up on toes with freezing episode; PT has to cue pt to  let go to stop U-step rollator.  Discussed that likely rolling walker is a better option than the U-step rollator at home.    Ambulation Distance (Feet) 125 Feet   with RW, x 125 ft with U-step rollator, 80 ft x 2   Assistive device Rolling walker;Other (Comment)    Gait Pattern Step-through pattern;Step-to pattern;Decreased stride length;Decreased dorsiflexion - right;Decreased dorsiflexion - left;Shuffle;Festinating;Trunk flexed;Poor foot clearance - left;Poor foot clearance - right    Ambulation Surface Level;Indoor    Pre-Gait Activities Standing at RW:  marching in place x 10 reps, with cues for high step marching; forward step and weightshift then return to midline; cues to slow pace and increase foot clearance.    Gait Comments Initial cues with gait to kick to red theraband tied in front of RW.  Pt able to do this for several feet prior to reverting to shuffling/festinating pattern.  Tactile cues given upon standing for wider BOS lateral weightshifting x 10 reps, then cues to take BIG first step to start.  With subsequent gait, PT provides verbal and tactile cues for wide BOS lateral weigthshifting to help maintain longer strides for longer period.  Pt needs cues throughout, but is able to manage turns and crowded area iwth less festination when cued to stop and perform lateral weightshift      Therapeutic Activites    Therapeutic Activities Other Therapeutic Activities    Other Therapeutic Activities Reviewed tips to reduce freezing with gait.  Discussed difference strategies:  wife to cue patient to STOP, reset, weightshift, then start with BIG step.  Cues to use red theraband for visual cue.                     PT Education - 02/18/21 1456     Education Details Addition of side to side weightshift upon standing to initiate gait with longer strides and also after stopping to reset with freezing episodes    Person(s) Educated Patient;Spouse    Methods Explanation;Demonstration     Comprehension Verbalized understanding;Returned demonstration              PT Short Term Goals - 01/29/21 0943       PT SHORT TERM GOAL #1   Title Pt will perform initial HEP with supervision from pt's spouse for strength, balance, and transfers in order to build upon functional gains made in therapy. ALL STGS DUE 02/26/21    Time 4    Period Weeks  Status New    Target Date 02/26/21      PT SHORT TERM GOAL #2   Title Pt and pt's spouse will verbalize understanding of fall prevention in the home.    Time 4    Period Weeks    Status New      PT SHORT TERM GOAL #3   Title Pt and pt's spouse will verbalize understanding of techniques to help with freezing/festination episodes in order to demo improved safety with gait.    Time 4    Period Weeks    Status New      PT SHORT TERM GOAL #4   Title Pt will perform TUG with RW vs. UStep Walker in 21 seconds or less in order to demo decr fall risk.    Baseline 23.75 seconds    Time 4    Period Weeks    Status New      PT SHORT TERM GOAL #5   Title Pt will perform 5 reps of sit <> stand with BUE support with proper technique and without BLE bracing in order to demo improved safety with functional transfers.    Time 4    Period Weeks    Status New               PT Long Term Goals - 01/29/21 0947       PT LONG TERM GOAL #1   Title Pt will perform final HEP with supervision from pt's spouse for strength, balance, and transfers in order to build upon functional gains made in therapy. ALL LTGS DUE 03/26/21    Time 8    Period Weeks    Status New    Target Date 03/26/21      PT LONG TERM GOAL #2   Title Pt and pt's spouse will verbalize understanding of local PD resources.    Time 8    Period Weeks    Status New      PT LONG TERM GOAL #3   Title Pt will improve gait speed with RW vs. Ustep walker to at least 2.7 ft/sec for improved community mobility.    Baseline 2.49 ft/sec    Time 8    Period Weeks    Status  New      PT LONG TERM GOAL #4   Title Pt will perform 5x sit <> stand in 18 seconds or less with UE support in order to demo improved functional transfers and decr fall risk.    Baseline 22.22 seconds, decr eccentric control    Time 8    Period Weeks    Status New      PT LONG TERM GOAL #5   Title Pt will perform TUG with RW vs. UStep Walker in 18.5 seconds or less in order to demo decr fall risk.    Baseline 23.75 seconds with RW    Time 8    Period Weeks    Status New      Additional Long Term Goals   Additional Long Term Goals Yes      PT LONG TERM GOAL #6   Title Pt will ambulate at least 230' with RW vs. UStep walker with supervision in order to demo improved household mobility.    Baseline needs min guard/min A with RW    Time 8    Period Weeks    Status New  Plan - 02/18/21 1458     Clinical Impression Statement Pt continues to demonstrate good understanding of sit<>stand technique with wife and PT cues.  Trialed U-step Rollator today, as pt has one at home; however, due to safety concerns, PT recommends use of rolling walker versus U-step rollator.  Worked on standing lateral weightshifting to improve ease of initiation of gait after transfers as well as initiation of gait after stopping to reset with freezing episodes.  He continues to benefit from cues throughout session.    Personal Factors and Comorbidities Comorbidity 3+;Time since onset of injury/illness/exacerbation;Past/Current Experience;Behavior Pattern    Comorbidities PD (diagnosed 2012), CAD (s/p CABG 2006), LD, HTN, a fib, pacemaker, GERD, on anticoagulants    Examination-Activity Limitations Bed Mobility;Caring for Others;Toileting;Transfers;Stairs;Locomotion Level;Stand;Squat;Reach Overhead    Therapist, art;Yard Work    Conservation officer, historic buildings Evolving/Moderate complexity    Rehab Potential Good    PT  Frequency 2x / week    PT Duration 12 weeks    PT Treatment/Interventions ADLs/Self Care Home Management;DME Instruction;Therapeutic activities;Functional mobility training;Gait training;Stair training;Therapeutic exercise;Balance training;Neuromuscular re-education;Patient/family education;Energy conservation;Vestibular    PT Next Visit Plan Pt is very hard of hearing even with hearing aids (need to wear clear mask). Review techniques for freezing/festination and practice during gait. Gait training with RW - working on cues for incr stride length and working on slowed pace, pt seemed to do well with use of tband as visual cue.  Also continue to use wide BOS lateral weightshifting to initiatie gait. Add to HEP as appropriate.  Work on turns.    Consulted and Agree with Plan of Care Patient;Family member/caregiver    Family Member Consulted pt's [name]             Patient will benefit from skilled therapeutic intervention in order to improve the following deficits and impairments:  Abnormal gait, Decreased activity tolerance, Decreased coordination, Decreased balance, Decreased endurance, Decreased knowledge of use of DME, Decreased safety awareness, Decreased mobility, Decreased strength, Difficulty walking, Impaired flexibility, Postural dysfunction  Visit Diagnosis: Other abnormalities of gait and mobility  Unsteadiness on feet  Other symptoms and signs involving the nervous system     Problem List Patient Active Problem List   Diagnosis Date Noted   HOH (hard of hearing) 12/18/2020   Personal history of colonic polyps 08/31/2016   Abnormality of gait 08/27/2014   Memory difficulty 08/27/2014   Atrial fibrillation (HCC) 09/08/2013   Cholelithiases 07/19/2013   Cholelithiasis 07/19/2013   Encounter for therapeutic drug monitoring 07/03/2013   Aortic valve replaced 04/05/2013   Parkinson disease (HCC) 09/27/2012   Orthostatic hypotension 04/01/2012   Pacemaker-Medtronic  03/29/2012   HEPATITIS A 06/04/2010   HYPERTHYROIDISM 06/04/2010   HYPERLIPIDEMIA 06/04/2010   Essential hypertension 06/04/2010   CAD 06/04/2010   AORTIC STENOSIS 06/04/2010   SICK SINUS SYNDROME 06/04/2010   HIATAL HERNIA 06/04/2010   SHORTNESS OF BREATH 06/04/2010   CHEST PAIN 06/04/2010    Patrica Mendell W., PT 02/18/2021, 3:04 PM  [facility] [address] [place], [place], [address] Phone: [phone]   Fax:  [phone]  Name: Donald [name] MRN: [id number] Date of Birth: Apr 14, 1941

## 2021-02-18 NOTE — Patient Instructions (Addendum)
Access Code: [email or web address] URL: [email or web address] Date: 02/18/2021 Prepared by: Lonia Blood  Exercises Seated Active Hip Flexion - 2 x daily - 5 x weekly - 1-2 sets - 10 reps  Added 02/18/2021:    Side to side weightshift - 2 x daily - 7 x weekly - 1 sets - 10 reps

## 2021-02-20 ENCOUNTER — Ambulatory Visit (INDEPENDENT_AMBULATORY_CARE_PROVIDER_SITE_OTHER): Payer: PPO | Admitting: *Deleted

## 2021-02-20 ENCOUNTER — Ambulatory Visit: Payer: PPO | Admitting: Physical Therapy

## 2021-02-20 ENCOUNTER — Other Ambulatory Visit: Payer: Self-pay

## 2021-02-20 ENCOUNTER — Encounter: Payer: Self-pay | Admitting: Physical Therapy

## 2021-02-20 DIAGNOSIS — R2689 Other abnormalities of gait and mobility: Secondary | ICD-10-CM

## 2021-02-20 DIAGNOSIS — Z5181 Encounter for therapeutic drug level monitoring: Secondary | ICD-10-CM

## 2021-02-20 DIAGNOSIS — R2681 Unsteadiness on feet: Secondary | ICD-10-CM

## 2021-02-20 DIAGNOSIS — R29818 Other symptoms and signs involving the nervous system: Secondary | ICD-10-CM

## 2021-02-20 LAB — POCT INR: INR: 3.6 — AB (ref 2.0–3.0)

## 2021-02-20 NOTE — Therapy (Signed)
[facility] [address] [place], [place], [address] Phone: [phone]   Fax:  [phone]  Physical Therapy Treatment  Patient Details  Name: Donald [name] MRN: [id number] Date of Birth: 1940/07/30 Referring Provider (PT): Dr. Arbutus Leas   Encounter Date: 02/20/2021   PT End of Session - 02/20/21 2036     Visit Number 5    Number of Visits 17    Date for PT Re-Evaluation 04/29/21    Authorization Type HTA - $30 co pay    PT Start Time 1022    PT Stop Time 1102    PT Time Calculation (min) 40 min    Equipment Utilized During Treatment Gait belt    Activity Tolerance Patient tolerated treatment well    Behavior During Therapy [facility] for tasks assessed/performed             Past Medical History:  Diagnosis Date   Abnormality of gait 08/27/2014   Anxiety    Aortic stenosis    s/p AVR by Dr Laneta Simmers   Arthritis    left wrist- probable- not diagonised   Asthma    in the past   CAD (coronary artery disease)    s/p CABG 2006   GERD (gastroesophageal reflux disease)    H/O seasonal allergies    H/O: rheumatic fever    Hearing deficit    Bilateral hearing aids   Hematuria    had CT scan   Hiatal hernia    History of kidney stones    HOH (hard of hearing)    Hyperlipidemia    Hypertension    Hyperthyroidism    Inguinal hernia    right side; watching at this time per wife's report   Memory difficulty 08/27/2014   Pacemaker    Parkinson's disease    Persistent atrial fibrillation (HCC)    Scarlet fever    as a child   Shortness of breath    Sick sinus syndrome (HCC)    s/p PPM (MDT) 2006    Past Surgical History:  Procedure Laterality Date   AORTIC VALVE REPLACEMENT  2006   CARDIOVERSION N/A 09/14/2013   Procedure: CARDIOVERSION;  Surgeon: Thurmon Fair, MD;  Location: [facility] ENDOSCOPY;  Service: Cardiovascular;  Laterality: N/A;   CHOLECYSTECTOMY N/A 07/21/2013   Procedure: LAPAROSCOPIC CHOLECYSTECTOMY;   Surgeon: Mariella Saa, MD;  Location: [facility] OR;  Service: General;  Laterality: N/A;   COLONOSCOPY W/ POLYPECTOMY     COLONOSCOPY WITH PROPOFOL N/A 08/31/2016   Procedure: COLONOSCOPY WITH PROPOFOL;  Surgeon: Charlott Rakes, MD;  Location: [facility] ENDOSCOPY;  Service: Endoscopy;  Laterality: N/A;   CORONARY ARTERY BYPASS GRAFT  2006   DENTAL SURGERY     implants   INSERT / REPLACE / REMOVE PACEMAKER  2006, 2012   MDT for sick sinus syndrome, gen change 8/12 by JA   KIDNEY STONE SURGERY  1984    There were no vitals filed for this visit.   Subjective Assessment - 02/20/21 1026     Subjective No falls, no pain.  Have boxing later today.  Wife reports he does better for [place] in therapy than for her at home.    Pertinent History PMH: PD, CAD (s/p CABG 2006), LD, HTN, a fib, pacemaker, GERD, on anticoagulants    Limitations Walking;House hold activities;Standing    Patient Stated Goals wants to improve his balance    Currently in Pain? No/denies  [facility] Adult PT Treatment/Exercise - 02/20/21 0001       Transfers   Transfers Sit to Stand;Stand to Sit    Sit to Stand 5: Supervision;With upper extremity assist;From bed;From chair/3-in-1    Sit to Stand Details Visual cues/gestures for sequencing    Stand to Sit 4: Min guard;5: Supervision;With upper extremity assist;To bed;To chair/3-in-1    Comments Pt performs at least 10 reps sit<>stand using good technique with visual cues/reminders only, throughout session.      Ambulation/Gait   Ambulation/Gait Yes    Ambulation/Gait Assistance 4: Min guard;4: Min assist    Ambulation Distance (Feet) 115 Feet   x 2; plus short distances focusing on turns   Assistive device Rolling walker    Gait Pattern Step-through pattern;Step-to pattern;Decreased stride length;Decreased dorsiflexion - right;Decreased dorsiflexion - left;Shuffle;Festinating;Trunk flexed;Poor foot clearance - left;Poor foot clearance  - right   Decreased shuffling and festination noted compared to previous session   Ambulation Surface Level;Indoor    Pre-Gait Activities --    Gait Comments Gait 25 ft x 4 reps, with RW, with focus on marching turns to change direction, with pt needing min assist and VCs.      High Level Balance   High Level Balance Activities Marching turns;Weight-shifting turns;Other (comment)    High Level Balance Comments Practiced turns: short distance gait with marchng turns (x 4 reps), then short distance gait attempting weightshifting turns, but pt does not do as well with weightshifting turns                 Balance Exercises - 02/20/21 0001       Balance Exercises: Standing   Marching Static;Solid surface;Upper extremity assist 2;5 reps;Limitations    Marching Limitations 3 sets, visual cues of [employer] and VCs to SLOW pace    Other Standing Exercises Wide BOS lateral weightshifting x 10 reps, cues for slowed pace.    Other Standing Exercises Comments Side step taps to target, 10 reps, then forward step taps to target, 10 reps. Forward kicks x 10 reps cues for SLOWED pace throughout.                PT Education - 02/20/21 2035     Education Details Education in turning techniques    Person(s) Educated Patient;Spouse    Methods Explanation;Demonstration    Comprehension Verbalized understanding;Returned demonstration              PT Short Term Goals - 01/29/21 0943       PT SHORT TERM GOAL #1   Title Pt will perform initial HEP with supervision from pt's spouse for strength, balance, and transfers in order to build upon functional gains made in therapy. ALL STGS DUE 02/26/21    Time 4    Period Weeks    Status New    Target Date 02/26/21      PT SHORT TERM GOAL #2   Title Pt and pt's spouse will verbalize understanding of fall prevention in the home.    Time 4    Period Weeks    Status New      PT SHORT TERM GOAL #3   Title Pt and pt's spouse will  verbalize understanding of techniques to help with freezing/festination episodes in order to demo improved safety with gait.    Time 4    Period Weeks    Status New      PT SHORT TERM GOAL #4   Title Pt will perform TUG  with RW vs. UStep Walker in 21 seconds or less in order to demo decr fall risk.    Baseline 23.75 seconds    Time 4    Period Weeks    Status New      PT SHORT TERM GOAL #5   Title Pt will perform 5 reps of sit <> stand with BUE support with proper technique and without BLE bracing in order to demo improved safety with functional transfers.    Time 4    Period Weeks    Status New               PT Long Term Goals - 01/29/21 0947       PT LONG TERM GOAL #1   Title Pt will perform final HEP with supervision from pt's spouse for strength, balance, and transfers in order to build upon functional gains made in therapy. ALL LTGS DUE 03/26/21    Time 8    Period Weeks    Status New    Target Date 03/26/21      PT LONG TERM GOAL #2   Title Pt and pt's spouse will verbalize understanding of local PD resources.    Time 8    Period Weeks    Status New      PT LONG TERM GOAL #3   Title Pt will improve gait speed with RW vs. Ustep walker to at least 2.7 ft/sec for improved community mobility.    Baseline 2.49 ft/sec    Time 8    Period Weeks    Status New      PT LONG TERM GOAL #4   Title Pt will perform 5x sit <> stand in 18 seconds or less with UE support in order to demo improved functional transfers and decr fall risk.    Baseline 22.22 seconds, decr eccentric control    Time 8    Period Weeks    Status New      PT LONG TERM GOAL #5   Title Pt will perform TUG with RW vs. UStep Walker in 18.5 seconds or less in order to demo decr fall risk.    Baseline 23.75 seconds with RW    Time 8    Period Weeks    Status New      Additional Long Term Goals   Additional Long Term Goals Yes      PT LONG TERM GOAL #6   Title Pt will ambulate at least 230' with  RW vs. UStep walker with supervision in order to demo improved household mobility.    Baseline needs min guard/min A with RW    Time 8    Period Weeks    Status New                   Plan - 02/20/21 2036     Clinical Impression Statement Focus of today's skilled PT session was pre-gait and gait activities to address turning techniques with gait and RW, to lessen festination with turns.  With cues and min assist, pt does best with wide U-turns and with marching turns for smaller radius turns.  He does well with cues to attend to red theraband for increased step length, and overall pt demo less festination in today's session than last session.  However, when pt leaves out of clinic gym area with wife, PT observes he reverts back to festinating, shuffling, freezing pattern.  He and wife may benefit from practice in session for wife  to provide cues with therapist providing feedback, to assist with carryover for home.    Personal Factors and Comorbidities Comorbidity 3+;Time since onset of injury/illness/exacerbation;Past/Current Experience;Behavior Pattern    Comorbidities PD (diagnosed 2012), CAD (s/p CABG 2006), LD, HTN, a fib, pacemaker, GERD, on anticoagulants    Examination-Activity Limitations Bed Mobility;Caring for Others;Toileting;Transfers;Stairs;Locomotion Level;Stand;Squat;Reach Overhead    Therapist, art;Yard Work    Conservation officer, historic buildings Evolving/Moderate complexity    Rehab Potential Good    PT Frequency 2x / week    PT Duration 12 weeks    PT Treatment/Interventions ADLs/Self Care Home Management;DME Instruction;Therapeutic activities;Functional mobility training;Gait training;Stair training;Therapeutic exercise;Balance training;Neuromuscular re-education;Patient/family education;Energy conservation;Vestibular    PT Next Visit Plan Pt is very hard of hearing even with hearing aids (need to wear  clear mask). Review techniques for turning and for freezing/festination and practice during gait. Gait training with RW - working on cues for incr stride length and working on slowed pace, pt seemed to do well with use of tband as visual cue.  Also continue to use wide BOS lateral weightshifting to initiatie gait. May need to have wife walk with pt and assist with cueing techniques, to help with carryover for improved gait at home.    Consulted and Agree with Plan of Care Patient;Family member/caregiver    Family Member Consulted pt's [name]             Patient will benefit from skilled therapeutic intervention in order to improve the following deficits and impairments:  Abnormal gait, Decreased activity tolerance, Decreased coordination, Decreased balance, Decreased endurance, Decreased knowledge of use of DME, Decreased safety awareness, Decreased mobility, Decreased strength, Difficulty walking, Impaired flexibility, Postural dysfunction  Visit Diagnosis: Other abnormalities of gait and mobility  Unsteadiness on feet  Other symptoms and signs involving the nervous system     Problem List Patient Active Problem List   Diagnosis Date Noted   HOH (hard of hearing) 12/18/2020   Personal history of colonic polyps 08/31/2016   Abnormality of gait 08/27/2014   Memory difficulty 08/27/2014   Atrial fibrillation (HCC) 09/08/2013   Cholelithiases 07/19/2013   Cholelithiasis 07/19/2013   Encounter for therapeutic drug monitoring 07/03/2013   Aortic valve replaced 04/05/2013   Parkinson disease (HCC) 09/27/2012   Orthostatic hypotension 04/01/2012   Pacemaker-Medtronic 03/29/2012   HEPATITIS A 06/04/2010   HYPERTHYROIDISM 06/04/2010   HYPERLIPIDEMIA 06/04/2010   Essential hypertension 06/04/2010   CAD 06/04/2010   AORTIC STENOSIS 06/04/2010   SICK SINUS SYNDROME 06/04/2010   HIATAL HERNIA 06/04/2010   SHORTNESS OF BREATH 06/04/2010   CHEST PAIN 06/04/2010    Kassia Demarinis  W., PT 02/20/2021, 8:42 PM  [facility] [address] [place], [place], [address] Phone: [phone]   Fax:  [phone]  Name: DORAN NESTLE MRN: [id number] Date of Birth: 02/19/1941

## 2021-02-20 NOTE — Patient Instructions (Signed)
For turning:  1)Wide Turns:  For larger spaces, take a wide U-turn around to turn, taking long strides to kick towards your red band, staying inside the walker.  2)Marching turns:  For smaller spaces, keep the walker close, move the walker, march high-step, move the walker, march high-step, until you turn to the direction you need to go.

## 2021-02-25 ENCOUNTER — Other Ambulatory Visit (HOSPITAL_COMMUNITY): Payer: Self-pay

## 2021-02-25 ENCOUNTER — Ambulatory Visit: Payer: PPO | Admitting: Occupational Therapy

## 2021-02-25 ENCOUNTER — Encounter: Payer: Self-pay | Admitting: Occupational Therapy

## 2021-02-25 ENCOUNTER — Ambulatory Visit: Payer: PPO | Admitting: Speech Pathology

## 2021-02-25 ENCOUNTER — Other Ambulatory Visit: Payer: Self-pay

## 2021-02-25 ENCOUNTER — Encounter: Payer: Self-pay | Admitting: Speech Pathology

## 2021-02-25 ENCOUNTER — Encounter: Payer: Self-pay | Admitting: Physical Therapy

## 2021-02-25 ENCOUNTER — Ambulatory Visit: Payer: PPO | Admitting: Physical Therapy

## 2021-02-25 DIAGNOSIS — R41842 Visuospatial deficit: Secondary | ICD-10-CM

## 2021-02-25 DIAGNOSIS — R2689 Other abnormalities of gait and mobility: Secondary | ICD-10-CM | POA: Diagnosis not present

## 2021-02-25 DIAGNOSIS — R2681 Unsteadiness on feet: Secondary | ICD-10-CM

## 2021-02-25 DIAGNOSIS — R29818 Other symptoms and signs involving the nervous system: Secondary | ICD-10-CM

## 2021-02-25 DIAGNOSIS — R1312 Dysphagia, oropharyngeal phase: Secondary | ICD-10-CM

## 2021-02-25 DIAGNOSIS — R1310 Dysphagia, unspecified: Secondary | ICD-10-CM

## 2021-02-25 DIAGNOSIS — R251 Tremor, unspecified: Secondary | ICD-10-CM

## 2021-02-25 DIAGNOSIS — R293 Abnormal posture: Secondary | ICD-10-CM

## 2021-02-25 DIAGNOSIS — R278 Other lack of coordination: Secondary | ICD-10-CM

## 2021-02-25 DIAGNOSIS — R471 Dysarthria and anarthria: Secondary | ICD-10-CM

## 2021-02-25 NOTE — Patient Instructions (Addendum)
   Loud "AH" 5x twice a day - big breath before each   Read 15 sentences focusing on taking a big breath and loud volume  Eat slowly - put utensil down in between bites and swallow twice for each bite  Follow liquids with solids - alternate  Wonda Olds 03/12/21 at 1:00 - check in with guest services at 12:45

## 2021-02-25 NOTE — Therapy (Addendum)
[facility] [address] [place], [place], [address] Phone: [phone]   Fax:  [phone]  Physical Therapy Treatment  Patient Details  Name: Donald [name] MRN: [id number] Date of Birth: 20-Apr-1941 Referring Provider (PT): Dr. Carles Collet   Encounter Date: 02/25/2021   PT End of Session - 02/25/21 1543     Visit Number 6    Number of Visits 17    Date for PT Re-Evaluation 04/29/21    Authorization Type HTA - $30 co pay    PT Start Time 1401    PT Stop Time 6195    PT Time Calculation (min) 44 min    Equipment Utilized During Treatment Gait belt    Activity Tolerance Patient tolerated treatment well    Behavior During Therapy [facility] for tasks assessed/performed             Past Medical History:  Diagnosis Date   Abnormality of gait 08/27/2014   Anxiety    Aortic stenosis    s/p AVR by Dr Cyndia Bent   Arthritis    left wrist- probable- not diagonised   Asthma    in the past   CAD (coronary artery disease)    s/p CABG 2006   GERD (gastroesophageal reflux disease)    H/O seasonal allergies    H/O: rheumatic fever    Hearing deficit    Bilateral hearing aids   Hematuria    had CT scan   Hiatal hernia    History of kidney stones    HOH (hard of hearing)    Hyperlipidemia    Hypertension    Hyperthyroidism    Inguinal hernia    right side; watching at this time per wife's report   Memory difficulty 08/27/2014   Pacemaker    Parkinson's disease    Persistent atrial fibrillation ([place])    Scarlet fever    as a child   Shortness of breath    Sick sinus syndrome ([place])    s/p PPM (MDT) 2006    Past Surgical History:  Procedure Laterality Date   AORTIC VALVE REPLACEMENT  2006   CARDIOVERSION N/A 09/14/2013   Procedure: CARDIOVERSION;  Surgeon: Sanda Klein, MD;  Location: [facility];  Service: Cardiovascular;  Laterality: N/A;   CHOLECYSTECTOMY N/A 07/21/2013   Procedure: LAPAROSCOPIC CHOLECYSTECTOMY;   Surgeon: Edward Jolly, MD;  Location: [facility];  Service: General;  Laterality: N/A;   COLONOSCOPY W/ POLYPECTOMY     COLONOSCOPY WITH PROPOFOL N/A 08/31/2016   Procedure: COLONOSCOPY WITH PROPOFOL;  Surgeon: Wilford Corner, MD;  Location: [facility] ENDOSCOPY;  Service: Endoscopy;  Laterality: N/A;   CORONARY ARTERY BYPASS GRAFT  2006   DENTAL SURGERY     implants   INSERT / REPLACE / REMOVE PACEMAKER  2006, 2012   MDT for sick sinus syndrome, gen change 8/12 by [place]    There were no vitals filed for this visit.   Subjective Assessment - 02/25/21 1408     Subjective No changes, no falls. Had his ST/OT evals this morning.    Pertinent History PMH: PD, CAD (s/p CABG 2006), LD, HTN, a fib, pacemaker, GERD, on anticoagulants    Limitations Walking;House hold activities;Standing    Patient Stated Goals wants to improve his balance    Currently in Pain? No/denies                [facility] PT Assessment - 02/25/21 1412  Timed Up and Go Test   Normal TUG (seconds) 51   seconds 1st attempt, 42 seconds 2nd attempt (when asking pt to take longer steps)   TUG Comments --                           [facility] Adult PT Treatment/Exercise - 02/25/21 1412       Transfers   Transfers Sit to Stand;Stand to Sit    Sit to Stand 5: Supervision;With upper extremity assist;From bed;From chair/3-in-1    Sit to Stand Details Visual cues/gestures for sequencing;Verbal cues for sequencing;Verbal cues for technique    Stand to Sit 4: Min guard;5: Supervision;With upper extremity assist;To bed;To chair/3-in-1    Comments Performed x10 reps at start of session, pt needing cues for proper UE placement and incr forward lean. Does need intermittent cues throughout remainder of session for proper technique      Ambulation/Gait   Ambulation/Gait Yes    Ambulation/Gait Assistance 4: Min guard;4: Min assist    Ambulation/Gait Assistance Details Worked on walking with  RW - lateral weight shifting rocking through hips to initiate gait (does need frequent verbal and manual cues for this today) and cues for BIG steps towards red tband. Pt with more freezing/festinating episodes today and practice with stopping to reset, rocking, and then taking a big step. Went over with pt's spouse proper cueing for technique at home.    Ambulation Distance (Feet) 115 Feet   x2, plus additional smaller distances   Assistive device Rolling walker    Gait Pattern Step-through pattern;Step-to pattern;Decreased stride length;Decreased dorsiflexion - right;Decreased dorsiflexion - left;Shuffle;Festinating;Trunk flexed;Poor foot clearance - left;Poor foot clearance - right    Ambulation Surface Level;Indoor    Gait Comments When performing small radius turns, cues for marching to change direction, needed verbal cues and assist to help      Therapeutic Activites    Therapeutic Activities Other Therapeutic Activities    Other Therapeutic Activities Pt's spouse asking about new [facility] as it will be muc closer to their home in [place] - PT discussed will have to ask about scheduling at that location.  Reviewed tips with pt and pt's spouse to help with freezing episodes during gait - wife to cue pt to STOP, reset, rock, and then take a BIG step to the red band.      Neuro Re-ed    Neuro Re-ed Details  Standing at RW: x10 reps lateral weight shifting from side to side to help initiate gait - pt needing verbal/demo/manual cues for proper technique as pt trying to perform rocking while moving feet in small steps going forwards                       PT Short Term Goals - 02/25/21 1419       PT SHORT TERM GOAL #1   Title Pt will perform initial HEP with supervision from pt's spouse for strength, balance, and transfers in order to build upon functional gains made in therapy. ALL STGS DUE 02/26/21    Time 4    Period Weeks    Status New    Target Date  02/26/21      PT SHORT TERM GOAL #2   Title Pt and pt's spouse will verbalize understanding of fall prevention in the home.    Time 4    Period Weeks    Status New  PT SHORT TERM GOAL #3   Title Pt and pt's spouse will verbalize understanding of techniques to help with freezing/festination episodes in order to demo improved safety with gait.    Baseline Reviewed today in session on 02/25/21 - will continue to benefit from additional practice/repetition with wife practicing cues with pt    Time 4    Period Weeks    Status Partially Met      PT SHORT TERM GOAL #4   Title Pt will perform TUG with RW vs. UStep Walker in 21 seconds or less in order to demo decr fall risk.    Baseline 23.75 seconds, 51 seconds first attempt 42 seconds 2nd attempt    Time 4    Period Weeks    Status Not Met      PT SHORT TERM GOAL #5   Title Pt will perform 5 reps of sit <> stand with BUE support with proper technique and without BLE bracing in order to demo improved safety with functional transfers.    Baseline met on 02/25/21    Time 4    Period Weeks    Status Achieved               PT Long Term Goals - 01/29/21 0947       PT LONG TERM GOAL #1   Title Pt will perform final HEP with supervision from pt's spouse for strength, balance, and transfers in order to build upon functional gains made in therapy. ALL LTGS DUE 03/26/21    Time 8    Period Weeks    Status New    Target Date 03/26/21      PT LONG TERM GOAL #2   Title Pt and pt's spouse will verbalize understanding of local PD resources.    Time 8    Period Weeks    Status New      PT LONG TERM GOAL #3   Title Pt will improve gait speed with RW vs. Ustep walker to at least 2.7 ft/sec for improved community mobility.    Baseline 2.49 ft/sec    Time 8    Period Weeks    Status New      PT LONG TERM GOAL #4   Title Pt will perform 5x sit <> stand in 18 seconds or less with UE support in order to demo improved functional  transfers and decr fall risk.    Baseline 22.22 seconds, decr eccentric control    Time 8    Period Weeks    Status New      PT LONG TERM GOAL #5   Title Pt will perform TUG with RW vs. UStep Walker in 18.5 seconds or less in order to demo decr fall risk.    Baseline 23.75 seconds with RW    Time 8    Period Weeks    Status New      Additional Long Term Goals   Additional Long Term Goals Yes      PT LONG TERM GOAL #6   Title Pt will ambulate at least 230' with RW vs. UStep walker with supervision in order to demo improved household mobility.    Baseline needs min guard/min A with RW    Time 8    Period Weeks    Status New                   Plan - 02/26/21 1047     Clinical Impression Statement Began to  check pt's STGs today. Pt met STG #5 - pt with improved sit <> stand technique with decr retropulsion, does still need intermittent cues for incr forward weight shift. Pt partially met STG #3, pt and pt's spouse able to verbalize understanding of tips to help with freezing, but pt does continue to need more practice during gait, esp with wide BOS weight shifting to help restart gait by taking a big step, did need manual cues today. Pt's spouse reports that he does much better walking in therapy, will practice at next session with pt's spouse giving cues to pt for incr carryover at home. Pt did not meet STG #4 in regards to TUG. Performed in 51 seconds at first with shuffling/festination pattern. When cued for 2nd attempt to perform with bigger steps, pt does perform in 42 seconds. Pt with slower pace with gait with RW since TUG was first assessed at eval at 23.75 seconds. Will continue to progress towards LTGs.    Personal Factors and Comorbidities Comorbidity 3+;Time since onset of injury/illness/exacerbation;Past/Current Experience;Behavior Pattern    Comorbidities PD (diagnosed 2012), CAD (s/p CABG 2006), LD, HTN, a fib, pacemaker, GERD, on anticoagulants     Examination-Activity Limitations Bed Mobility;Caring for Others;Toileting;Transfers;Stairs;Locomotion Level;Stand;Squat;Reach Overhead    Designer, fashion/clothing;Yard Work    [employer]chant [employer] officer Evolving/Moderate complexity    Rehab Potential Good    PT Frequency 2x / week    PT Duration 12 weeks    PT Treatment/Interventions ADLs/Self Care Home Management;DME Instruction;Therapeutic activities;Functional mobility training;Gait training;Stair training;Therapeutic exercise;Balance training;Neuromuscular re-education;Patient/family education;Energy conservation;Vestibular    PT Next Visit Plan check remainder of STGs. Pt is very hard of hearing even with hearing aids (need to wear clear mask).  have wife walk with pt and assist with cueing techniques, to help with carryover for improved gait at home. Continue gait and turning training with RW - working on cues for incr stride length and working on slowed pace, pt seemed to do well with use of tband as visual cue. Continue to use wide BOS lateral weightshifting to initiatie gait. potentially scheduling at [facility]?    Consulted and Agree with Plan of Care Patient;Family member/caregiver    Family Member Consulted pt's [name]             Patient will benefit from skilled therapeutic intervention in order to improve the following deficits and impairments:  Abnormal gait, Decreased activity tolerance, Decreased coordination, Decreased balance, Decreased endurance, Decreased knowledge of use of DME, Decreased safety awareness, Decreased mobility, Decreased strength, Difficulty walking, Impaired flexibility, Postural dysfunction  Visit Diagnosis: Other abnormalities of gait and mobility  Unsteadiness on feet  Other symptoms and signs involving the nervous system     Problem List Patient Active Problem List   Diagnosis Date Noted   HOH (hard of hearing)  12/18/2020   Personal history of colonic polyps 08/31/2016   Abnormality of gait 08/27/2014   Memory difficulty 08/27/2014   Atrial fibrillation ([place]) 09/08/2013   Cholelithiases 07/19/2013   Cholelithiasis 07/19/2013   Encounter for therapeutic drug monitoring 07/03/2013   Aortic valve replaced 04/05/2013   Parkinson disease ([place]) 09/27/2012   Orthostatic hypotension 04/01/2012   Pacemaker-[employer] 03/29/2012   HEPATITIS A 06/04/2010   HYPERTHYROIDISM 06/04/2010   HYPERLIPIDEMIA 06/04/2010   Essential hypertension 06/04/2010   CAD 06/04/2010   AORTIC STENOSIS 06/04/2010   SICK SINUS SYNDROME 06/04/2010   HIATAL HERNIA 06/04/2010   SHORTNESS OF BREATH 06/04/2010   CHEST PAIN 06/04/2010    Caidence Higashi  Cherlynn Kaiser, PT, DPT  02/26/2021, 10:52 AM  [facility] [address] [place], [place], [address] Phone: [phone]   Fax:  [phone]  Name: Donald [name] MRN: [id number] Date of Birth: 03/22/1941

## 2021-02-25 NOTE — Therapy (Signed)
[facility] [address] [place], [place], [address] Phone: [phone]   Fax:  [phone]  Speech Language Pathology Evaluation  Patient Details  Name: Donald [name] MRN: [id number] Date of Birth: 10-24-1940 Referring Provider (SLP): Dr. Lurena Joiner Tat   Encounter Date: 02/25/2021   End of Session - 02/25/21 1351     Visit Number 1    Number of Visits 25    Date for SLP Re-Evaluation 05/23/21    SLP Start Time 1015    SLP Stop Time  1057    SLP Time Calculation (min) 42 min    Activity Tolerance Patient tolerated treatment well             Past Medical History:  Diagnosis Date   Abnormality of gait 08/27/2014   Anxiety    Aortic stenosis    s/p AVR by Dr Laneta Simmers   Arthritis    left wrist- probable- not diagonised   Asthma    in the past   CAD (coronary artery disease)    s/p CABG 2006   GERD (gastroesophageal reflux disease)    H/O seasonal allergies    H/O: rheumatic fever    Hearing deficit    Bilateral hearing aids   Hematuria    had CT scan   Hiatal hernia    History of kidney stones    HOH (hard of hearing)    Hyperlipidemia    Hypertension    Hyperthyroidism    Inguinal hernia    right side; watching at this time per wife's report   Memory difficulty 08/27/2014   Pacemaker    Parkinson's disease    Persistent atrial fibrillation (HCC)    Scarlet fever    as a child   Shortness of breath    Sick sinus syndrome (HCC)    s/p PPM (MDT) 2006    Past Surgical History:  Procedure Laterality Date   AORTIC VALVE REPLACEMENT  2006   CARDIOVERSION N/A 09/14/2013   Procedure: CARDIOVERSION;  Surgeon: Thurmon Fair, MD;  Location: [facility] ENDOSCOPY;  Service: Cardiovascular;  Laterality: N/A;   CHOLECYSTECTOMY N/A 07/21/2013   Procedure: LAPAROSCOPIC CHOLECYSTECTOMY;  Surgeon: Mariella Saa, MD;  Location: [facility] OR;  Service: General;  Laterality: N/A;   COLONOSCOPY W/ POLYPECTOMY      COLONOSCOPY WITH PROPOFOL N/A 08/31/2016   Procedure: COLONOSCOPY WITH PROPOFOL;  Surgeon: Charlott Rakes, MD;  Location: [facility] ENDOSCOPY;  Service: Endoscopy;  Laterality: N/A;   CORONARY ARTERY BYPASS GRAFT  2006   DENTAL SURGERY     implants   INSERT / REPLACE / REMOVE PACEMAKER  2006, 2012   MDT for sick sinus syndrome, gen change 8/12 by JA   KIDNEY STONE SURGERY  1984    There were no vitals filed for this visit.   Subjective Assessment - 02/25/21 1022     Subjective "He didn't pass his swallow test"    Patient is accompained by: Family member   Wife, [name]   Currently in Pain? No/denies                SLP Evaluation [facility] - 02/25/21 1022       SLP Visit Information   SLP Received On 02/25/21    Referring Provider (SLP) Dr. Lurena Joiner Tat    Onset Date 2012    Medical Diagnosis Parkinson's Disease      Subjective   Patient/Family Stated Goal To be understood better      General Information  HPI Dx wtih PD 2012. MBSS in July 2020 revealed moderate dysphagia with significant pharyngeal residue, silent aspiration, reduce tongue base, epiglittic inversion, pharyngeal contraction, hocking and drool. Recommended Dys 3 and thin liquids, and outpt ST, however pt not seen by [place] at that time. This is his 1st course of ST.  Pt is very hard of hearing. Wears knee and elbow pads and a helmet due to being on anticoagulant therapy. Pt's wife reports that he has had a lot of falls and has trouble getting up from a chair and getting in and out of the bed.    Mobility Status walks with walker - on PT caseload      Balance Screen   Has the patient fallen in the past 6 months Yes    How many times? 5-6    Has the patient had a decrease in activity level because of a fear of falling?  Yes    Is the patient reluctant to leave their home because of a fear of falling?  Yes      Prior Functional Status   Cognitive/Linguistic Baseline Baseline deficits    Baseline deficit details memory,  processing, attention, decision making    Type of Home House     Lives With Spouse    Vocation Retired      IT consultant Comprehension   Overall Auditory Comprehension Impaired    Yes/No Questions Not tested    Commands Impaired    Two Step Basic Commands 50-74% accurate    Conversation Simple    Interfering Components Attention;Processing speed;Working Research officer, political party Mode of Expression Verbal      Verbal Expression   Overall Verbal Expression Impaired    Initiation Impaired    Level of Generative/Spontaneous Verbalization Sentence;Conversation    Interfering Components Attention    Effective Techniques Sentence completion;Semantic cues      Oral Motor/Sensory Function   Overall Oral Motor/Sensory Function Impaired    Labial ROM Within Functional Limits    Labial Symmetry Within Functional Limits    Labial Strength Reduced    Labial Coordination Reduced    Lingual ROM Reduced right;Reduced left    Lingual Symmetry Within Functional Limits    Lingual Strength Reduced    Lingual Sensation Reduced    Lingual Coordination Reduced    Facial Coordination Reduced    Velum Within Functional Limits    Overall Oral Motor/Sensory Function --   jaw, tongue lips tremor     Motor Speech   Overall Motor Speech Impaired    Respiration Impaired    Level of Impairment Sentence    Phonation Low vocal intensity;Wet    Resonance Hyponasality    Articulation Impaired    Level of Impairment Conversation    Intelligibility Intelligibility reduced    Word 75-100% accurate    Phrase 75-100% accurate    Sentence 75-100% accurate    Conversation 50-74% accurate    Interfering Components Hearing loss    Effective Techniques Slow rate;Increased vocal intensity;Over-articulate;Pause                             SLP Education - 02/25/21 1351     Education Details HEP, MBSS scheduled    Person(s) Educated Patient;Spouse    Methods  Explanation;Demonstration;Verbal cues;Handout    Comprehension Returned demonstration;Verbal cues required;Need further instruction;Verbalized understanding  SLP Short Term Goals - 02/25/21 1402       SLP SHORT TERM GOAL #1   Title Pt will average 85dB on loud /a/ with occasional min A over 2 sessions.    Time 6    Period Weeks    Status New      SLP SHORT TERM GOAL #2   Title Pt will average 68dB on 18/20 sentences with occasional min A    Time 6    Period Weeks    Status New      SLP SHORT TERM GOAL #3   Title Pt/spouse will follow 2 diet modificiations with occasional min A over 3 sessions    Time 6    Period Weeks    Status New      SLP SHORT TERM GOAL #4   Title Pt will complete HEP for dysphagia with usual min  visual and verbal cues over 2 sessions    Time 6    Period Weeks    Status New      SLP SHORT TERM GOAL #5   Title Pt will average 68dB over 5 minute conversation with occasional min A over 2 sessions    Time 6    Period Weeks    Status New              SLP Long Term Goals - 02/25/21 1417       SLP LONG TERM GOAL #1   Title Pt will average 85dB on loud /a/ with rare min A over 2 sessions    Time 12    Period Weeks    Status New      SLP LONG TERM GOAL #2   Title Pt will average 68dB over 12 minute conversation with occasional min A    Time 12    Period Weeks    Status New      SLP LONG TERM GOAL #3   Title Pt will complete HEP for dysarthria and dysphagia with occasional min A over 3 sessions    Time 12    Period Weeks    Status New      SLP LONG TERM GOAL #4   Title Pt will follow swallow precautions with occasional min A over 3 sessions    Time 12    Period Weeks    Status New      SLP LONG TERM GOAL #5   Title Pt/spouse will verbalize 4 s/s of aspiration pna with rare min A    Time 12    Period Weeks    Status New              Plan - 02/25/21 1352     Clinical Impression Statement Mr. Kaenan Jake is  referred to outpt ST due to speech and swallowing difficulties c/w advanced Parkinson's Disease. He is accompanied by his spouse, [name]. [name] and [place] report reduced intellgiblity and Hoehn endorses being asked to repeat himself frequently. He is not particpating in phone calls with his children and grandchildren due to reduced intelligilbity and slow processing. His volume started off WNL, however Barbra states "it's not like this at home, he is showing off for you." As eval progressed, volume became reduce and speech imprecise. 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 MBSS 11/2018 revealed moderate oropharyngeal dysphagia with significant pharyngeal residue and silent aspiration he was unabel to clear. They deny any hx of pna. PO trials of soft solid (cereal bar) and cup sips  of thin liquids result in wet voice and respirations, immediate cough 6x and throat cleasr. After PO trials completed, significant excessive coughing, hocking, tearing and sneezing occured for 10+ minutes. [name] states "this is how it goes each meal." Terral is mouth breather who had episodes of drool throughout the evaluation. He is taking his meds by the handful. Twice he awoke at night and hocked up his pm meds. He has lost 20lbs. MBSS recommended Dys 3 solids, however he is eating regular foods. [name] says he requires constant reminders to take small bites, and he will still eat large bites rapidly. As MBSS was over 2 years ago, I recommend repeat MBSS.   .   Measured when a sound level meter was placed 30 cm away from pt's mouth, 8 minutes of conversational speech was reduced today, at average 67dB (WNL= average 70-72dB) with range of 65-73dB. Overall speech intelligibility for this listener in a quiet environment was not affected, at approx 95%. Production of loud /a/ averaged 82dB (range of 80-to 83) and mod cues  needed for loudness.   Pt then rated effort level at 7/10 for production of loud  /a/ (10=maximal effort). In oral reading task pt was asked to use the same amount of effort as with loud /a/. Loudness average with this increased effort was 74dB (range of 70 to 76) with mod A  for loudness. Pt would benefit from skilled ST in order to improve speech intelligibility and pt's QOL. I initiated HEP for dysarthria, however ongoing coughing and hocking interfered with instruction.    Speech Therapy Frequency 2x / week    Duration 12 weeks    Treatment/Interventions Aspiration precaution training;Language facilitation;Environmental controls;Cueing hierarchy;SLP instruction and feedback;Pharyngeal strengthening exercises;Compensatory techniques;Cognitive reorganization;Functional tasks;Compensatory strategies;Patient/family education;Multimodal communcation approach;Internal/external aids;Trials of upgraded texture/liquids;Diet toleration management by SLP;Other (comment)   repeat MBSS; EMST if indicated            Patient will benefit from skilled therapeutic intervention in order to improve the following deficits and impairments:   Dysarthria and anarthria  Dysphagia, oropharyngeal phase - Plan: SLP modified barium swallow    Problem List Patient Active Problem List   Diagnosis Date Noted   HOH (hard of hearing) 12/18/2020   Personal history of colonic polyps 08/31/2016   Abnormality of gait 08/27/2014   Memory difficulty 08/27/2014   Atrial fibrillation (HCC) 09/08/2013   Cholelithiases 07/19/2013   Cholelithiasis 07/19/2013   Encounter for therapeutic drug monitoring 07/03/2013   Aortic valve replaced 04/05/2013   Parkinson disease (HCC) 09/27/2012   Orthostatic hypotension 04/01/2012   Pacemaker-Medtronic 03/29/2012   HEPATITIS A 06/04/2010   HYPERTHYROIDISM 06/04/2010   HYPERLIPIDEMIA 06/04/2010   Essential hypertension 06/04/2010   CAD 06/04/2010   AORTIC STENOSIS 06/04/2010   SICK SINUS SYNDROME 06/04/2010   HIATAL HERNIA 06/04/2010   SHORTNESS OF BREATH  06/04/2010   CHEST PAIN 06/04/2010    Zeinab Rodwell, Radene Journey MS, CCC-SLP 02/25/2021, 2:20 PM  [facility] [address] [place], [place], [address] Phone: [phone]   Fax:  [phone]  Name: JOE GEE MRN: [id number] Date of Birth: 08/06/1940

## 2021-02-25 NOTE — Therapy (Addendum)
[facility] [address] [place], [place], [address] Phone: [phone]   Fax:  [phone]  Occupational Therapy Evaluation  Patient Details  Name: Donald [name] MRN: [id number] Date of Birth: 02/17/1941 Referring Provider (OT): Dr. Lurena Joiner Tat   Encounter Date: 02/25/2021   OT End of Session - 02/25/21 1547     Visit Number 1    Number of Visits 17    Date for OT Re-Evaluation 04/26/21    Authorization Type [employer], follow Medicare guidelines, copay per day    Authorization - Visit Number 1    Authorization - Number of Visits 10    Progress Note Due on Visit 10    OT Start Time 1105    OT Stop Time 1145    OT Time Calculation (min) 40 min    Activity Tolerance Patient tolerated treatment well    Behavior During Therapy Impulsive   timing deficits            Past Medical History:  Diagnosis Date   Abnormality of gait 08/27/2014   Anxiety    Aortic stenosis    s/p AVR by Dr Laneta Simmers   Arthritis    left wrist- probable- not diagonised   Asthma    in the past   CAD (coronary artery disease)    s/p CABG 2006   GERD (gastroesophageal reflux disease)    H/O seasonal allergies    H/O: rheumatic fever    Hearing deficit    Bilateral hearing aids   Hematuria    had CT scan   Hiatal hernia    History of kidney stones    HOH (hard of hearing)    Hyperlipidemia    Hypertension    Hyperthyroidism    Inguinal hernia    right side; watching at this time per wife's report   Memory difficulty 08/27/2014   Pacemaker    Parkinson's disease    Persistent atrial fibrillation (HCC)    Scarlet fever    as a child   Shortness of breath    Sick sinus syndrome (HCC)    s/p PPM (MDT) 2006    Past Surgical History:  Procedure Laterality Date   AORTIC VALVE REPLACEMENT  2006   CARDIOVERSION N/A 09/14/2013   Procedure: CARDIOVERSION;  Surgeon: Thurmon Fair, MD;  [facility]: [facility] ENDOS[facility];  Service:  Cardiovascular;  Laterality: N/A;   CHOLECYSTECTOMY N/A 07/21/2013   Procedure: LAPAROSCOPIC CHOLECYSTECTOMY;  Surgeon: Mariella Saa, MD;  Location: [facility] OR;  Service: General;  Laterality: N/A;   COLONOSCOPY W/ POLYPECTOMY     COLONOSCOPY WITH PROPOFOL N/A 08/31/2016   Procedure: COLONOSCOPY WITH PROPOFOL;  Surgeon: Charlott Rakes, MD;  Location: [facility] ENDOSCOPY;  Service: Endoscopy;  Laterality: N/A;   CORONARY ARTERY BYPASS GRAFT  2006   DENTAL SURGERY     implants   INSERT / REPLACE / REMOVE PACEMAKER  2006, 2012   MDT for sick sinus syndrome, gen change 8/12 by JA   KIDNEY STONE SURGERY  1984    There were no vitals filed for this visit.   Subjective Assessment - 02/25/21 1536     Subjective  pt reports only scapes with falls    Pertinent History Parkinson's Disease.   PMH:  hx of falls (wears helment), arthritis, asthma, CAD, GERD, HOH, HLD, HTN, hyperthyroidism, anxiety, aortic stenosis, hx of aortic valve replacement 2006, memory difficulty, pacemaker, persistent a-fib, hx of scarlet fever, shortness of breath, hx of CABG 2006, cholecystectomy 2015  Limitations fall risk (wears helment and pads), freezing, hard of hearing--reads lips (wear clear mask)    Patient Stated Goals improve coordination    Currently in Pain? No/denies               [facility] OT Assessment - 02/25/21 0001       Assessment   Medical Diagnosis Parkinson's Disease    Referring Provider (OT) Dr. Lurena Joiner Tat    Onset Date/Surgical Date 02/14/21   diagnosis 2012   Hand Dominance Left    Prior Therapy PT once before, 2-3 years ago      Precautions   Precautions Fall;ICD/Pacemaker    Precaution Comments very hard of hearing (reads lips), on anticoagulant therapy      Balance Screen   Has the patient fallen in the past 6 months Yes    How many times? 5-6   just scrapes; turning, mostly outside     Home  Environment   Family/patient expects to be discharged to: Private residence    [place]  Sp[place]     Prior Function   Level of Independence Independent with basic ADLs;Independent with household mobility with device    Leisure used to like to go fishing, being in the yard, attends [employer]      ADL   Eating/Feeding Modified independent   drops utensils   Grooming Modified independent   takes significant incr time   Upper Body Bathing Modified independent    Lower Body Bathing Modified independent    Upper Body Dressing --   difficulty/assist for jacket   Lower Body Dressing --   not tying shoes tight enough, difficulty with button on pants and needs assist at times.   Marketing executive bars   3 grab bars, shower stall   Transfers/Ambulation Related to ADL's pt does not fully wt. shift with sit>stand, needs CGA/cueing at times    ADL comments wife reports that it takes pt 1.5hrs to get ready      IADL   Shopping --   sits in car while wife shops   Prior Level of Function Light Housekeeping wife performs cleaning; bushhog pature, pick up sticks    Meal Prep --   pt able to perform sandwich/snack prep   Medication Management --   wife assists with alarm on phone and medication organizer.  Pt gets meds once alarm goes off     Mobility   Mobility Status History of falls;Freezing    Mobility Status Comments Ambulates with RW, freezing with festination (walker gets ahead of him)      Vision - History   Baseline Vision Wears glasses only for reading    Additional Comments hx of cataract surgery      Vision Assessment   Vision Assessment --   will be assessed further in functional context   Ocular Range of Motion --   appears to demo decr inferior/superior gaze   Tracking/Visual Pursuits Decreased smoothing of vertical tracking      Cognition    Overall Cognitive Status Cognition to be further assessed in functional context PRN    Memory Impaired    Memory Impairment --   per Epic   Awareness Impaired    Behaviors Impulsive   timing deficits  Observation/Other Assessments   Other Surveys  Select    Physical Performance Test   Yes    Simulated Eating Time (seconds) 12.47   uses gross grasp, shoulder compensation, holds end of spoon   Donning Doffing Jacket Time (seconds) 15.81sec with close supervision due to decr balance, difficulty      Posture/Postural Control   Posture/Postural Control Postural limitations    Postural Limitations Rounded Shoulders;Forward head      Coordination   Gross Motor Movements are Fluid and Coordinated No    Fine Motor Movements are Fluid and Coordinated No    9 Hole Peg Test Right;Left    Right 9 Hole Peg Test 88.31    Left 9 Hole Peg Test 108.94    Box and Blocks R-38 blocks, L-32 blocks    Tremors more on R side, but can be both, pt reports more with activity    Other difficulty setting things down gently, drops items    Coordination impulsive, timing deficits, compensates with shoulder but R>L                                OT Short Term Goals - 02/25/21 2123       OT SHORT TERM GOAL #1   Title Pt will perform PD-specific HEP with cueing prn from caregiver--check STGs 03/27/21    Time 4    Period Weeks    Status New      OT SHORT TERM GOAL #2   Title Pt will improve R hand coordination for ADLs (buttoning/tying shoes) as shown by completing 9-hole peg test in 75sec or less.    Baseline 88.31sec    Time 4    Period Weeks    Status New      OT SHORT TERM GOAL #3   Title Pt will improve L hand coordination for ADLs (buttoning/tying shoes) as shown by completing 9-hole peg test in 90 sec or less.    Baseline 108.94sec    Time 4    Period Weeks    Status New      OT SHORT TERM GOAL #4   Title Pt/caregiver will verbalize understanding of ways to decr risk  of future complcations related to PD (including falls).    Time 4    Period Weeks    Status New               OT Long Term Goals - 02/25/21 2128       OT LONG TERM GOAL #1   Title Pt/caregiver will verbalize understanding of AE/strategies to incr ease, safety, and independence with ADLs/IADLs.--check LTGs 04/26/21    Time 8    Period Weeks    Status New      OT LONG TERM GOAL #2   Title Pt will improve R hand coordination for ADLs (buttoning/tying shoes) as shown by completing 9-hole peg test in 65sec or less.    Time 8    Period Weeks    Status New      OT LONG TERM GOAL #3   Title Pt will improve L hand coordination for ADLs (buttoning/tying shoes) as shown by completing 9-hole peg test in 75 sec or less.    Time 8    Period Weeks    Status New      OT LONG TERM GOAL #4   Title Pt will improve functional reaching/coordination for ADLs as shown by improving score  on box and blocks test by at least 5 with dominant LUE.    Baseline 32 blocks    Time 8    Period Weeks    Status New      OT LONG TERM GOAL #5   Title Pt/caregiver will verbalize understanding of appropriate community resources.    Time 8    Period Weeks    Status New                   Plan - 02/25/21 1549     Clinical Impression Statement Pt is a 80 y.o. male referred to occupational therapy for Parkinson's Disease.  Pt with no previous occupational therapy.  PMH includes:  hx of falls (wears helment), arthritis, asthma, CAD, GERD, HOH, HLD, HTN, hyperthyroidism, anxiety, aortic stenosis, hx of aortic valve replacement 2006, memory difficulty, pacemaker, persistent a-fib, hx of scarlet fever, shortness of breath, hx of CABG 2006, cholecystectomy 2015. Wife reports that pt has difficulty/incr time for ADLs/IADLs, hx of falls, and difficulty with coordination.  Pt presents today with bradykinesia, decr coordination, timing deficits, decr posture, decr balance/functional mobility, decr safety,  tremor, cognitive deficits, and ?visual-perceptual deficits.  Pt would benefit from occupational therapy to address these deficits for decr risk of future complications, incr ease/safety with ADLs/IADLs, improved UE functional use.    OT Occupational Profile and History Detailed Assessment- Review of Records and additional review of physical, cognitive, psychosocial history related to current functional performance    Occupational performance deficits (Please refer to evaluation for details): ADL's;IADL's;Leisure    Body Structure / Function / Physical Skills Balance;ADL;Decreased knowledge of use of DME;UE functional use;IADL;Improper spinal/pelvic alignment;Vision;Mobility;Coordination;Decreased knowledge of precautions;FMC    Rehab Potential Good    Clinical Decision Making Several treatment options, min-mod task modification necessary    Comorbidities Affecting Occupational Performance: May have comorbidities impacting occupational performance    Modification or Assistance to Complete Evaluation  Min-Moderate modification of tasks or assist with assess necessary to complete eval    OT Frequency 2x / week    OT Duration 8 weeks   +eval   OT Treatment/Interventions Self-care/ADL training;Moist Heat;DME and/or AE instruction;Therapeutic activities;Therapeutic exercise;Cognitive remediation/compensation;Visual/perceptual remediation/compensation;Passive range of motion;Functional Mobility Training;Neuromuscular education;Cryotherapy;Energy conservation;Manual Therapy;Patient/family education    Plan formally assess ROM and rigidity and add LTG if appropriate; initiate HEP with focus on timing, large deliberate movements; strategies for ADLs    Consulted and Agree with Plan of Care Patient;Family member/caregiver    Family Member Consulted wife             Patient will benefit from skilled therapeutic intervention in order to improve the following deficits and impairments:   Body Structure /  Function / Physical Skills: Balance, ADL, Decreased knowledge of use of DME, UE functional use, IADL, Improper spinal/pelvic alignment, Vision, Mobility, Coordination, Decreased knowledge of precautions, FMC       Visit Diagnosis: Other symptoms and signs involving the nervous system  Other lack of coordination  Abnormal posture  Unsteadiness on feet  Tremor  Other abnormalities of gait and mobility  Visuospatial deficit    Problem List Patient Active Problem List   Diagnosis Date Noted   HOH (hard of hearing) 12/18/2020   Personal history of colonic polyps 08/31/2016   Abnormality of gait 08/27/2014   Memory difficulty 08/27/2014   Atrial fibrillation (HCC) 09/08/2013   Cholelithiases 07/19/2013   Cholelithiasis 07/19/2013   Encounter for therapeutic drug monitoring 07/03/2013   Aortic valve replaced 04/05/2013  Parkinson disease (HCC) 09/27/2012   Orthostatic hypotension 04/01/2012   Pacemaker-Medtronic 03/29/2012   HEPATITIS A 06/04/2010   HYPERTHYROIDISM 06/04/2010   HYPERLIPIDEMIA 06/04/2010   Essential hypertension 06/04/2010   CAD 06/04/2010   AORTIC STENOSIS 06/04/2010   SICK SINUS SYNDROME 06/04/2010   HIATAL HERNIA 06/04/2010   SHORTNESS OF BREATH 06/04/2010   CHEST PAIN 06/04/2010    Mazen Marcin, OT/L 02/25/2021, 9:35 PM  [facility] [address] [place], [place], [address] Phone: [phone]   Fax:  [phone]  Name: RAKWON LETOURNEAU MRN: [id number] Date of Birth: January 02, 1941  Willa Frater, OTR/L [facility] [address] [place], [place]  [address] [phone] phone [phone] 02/25/21 9:35 PM

## 2021-02-25 NOTE — Addendum Note (Signed)
Addended by: Willa Frater D on: 02/25/2021 09:37 PM   Modules accepted: Orders

## 2021-02-27 ENCOUNTER — Ambulatory Visit: Payer: PPO | Admitting: Physical Therapy

## 2021-03-04 ENCOUNTER — Ambulatory Visit: Payer: PPO | Admitting: Physical Therapy

## 2021-03-06 ENCOUNTER — Encounter: Payer: Self-pay | Admitting: Occupational Therapy

## 2021-03-06 ENCOUNTER — Other Ambulatory Visit: Payer: Self-pay

## 2021-03-06 ENCOUNTER — Encounter: Payer: Self-pay | Admitting: Physical Therapy

## 2021-03-06 ENCOUNTER — Ambulatory Visit: Payer: PPO | Admitting: Physical Therapy

## 2021-03-06 ENCOUNTER — Ambulatory Visit: Payer: PPO | Admitting: Occupational Therapy

## 2021-03-06 DIAGNOSIS — R2681 Unsteadiness on feet: Secondary | ICD-10-CM

## 2021-03-06 DIAGNOSIS — R29818 Other symptoms and signs involving the nervous system: Secondary | ICD-10-CM

## 2021-03-06 DIAGNOSIS — R293 Abnormal posture: Secondary | ICD-10-CM

## 2021-03-06 DIAGNOSIS — R41842 Visuospatial deficit: Secondary | ICD-10-CM

## 2021-03-06 DIAGNOSIS — R2689 Other abnormalities of gait and mobility: Secondary | ICD-10-CM | POA: Diagnosis not present

## 2021-03-06 DIAGNOSIS — R251 Tremor, unspecified: Secondary | ICD-10-CM

## 2021-03-06 DIAGNOSIS — R278 Other lack of coordination: Secondary | ICD-10-CM

## 2021-03-06 NOTE — Therapy (Addendum)
[facility] [address] [place], [place], [address] Phone: [phone]   Fax:  [phone]  Occupational Therapy Treatment  Patient Details  Name: Donald [name] MRN: [id number] Date of Birth: 09/25/1940 Referring Provider (OT): Dr. Lurena Joiner Tat   Encounter Date: 03/06/2021   OT End of Session - 03/06/21 1535     Visit Number 2    Number of Visits 17    Date for OT Re-Evaluation 04/26/21    Authorization Type [employer], follow Medicare guidelines, copay per day    Authorization - Visit Number 1    Authorization - Number of Visits 10    Progress Note Due on Visit 10    Activity Tolerance Patient tolerated treatment well    Behavior During Therapy Impulsive   timing deficits          Time in/out:  9:37-10:17  Past Medical History:  Diagnosis Date   Abnormality of gait 08/27/2014   Anxiety    Aortic stenosis    s/p AVR by Dr Laneta Simmers   Arthritis    left wrist- probable- not diagonised   Asthma    in the past   CAD (coronary artery disease)    s/p CABG 2006   GERD (gastroesophageal reflux disease)    H/O seasonal allergies    H/O: rheumatic fever    Hearing deficit    Bilateral hearing aids   Hematuria    had CT scan   Hiatal hernia    History of kidney stones    HOH (hard of hearing)    Hyperlipidemia    Hypertension    Hyperthyroidism    Inguinal hernia    right side; watching at this time per wife's report   Memory difficulty 08/27/2014   Pacemaker    Parkinson's disease    Persistent atrial fibrillation (HCC)    Scarlet fever    as a child   Shortness of breath    Sick sinus syndrome (HCC)    s/p PPM (MDT) 2006    Past Surgical History:  Procedure Laterality Date   AORTIC VALVE REPLACEMENT  2006   CARDIOVERSION N/A 09/14/2013   Procedure: CARDIOVERSION;  Surgeon: Thurmon Fair, MD;  Location: [facility] ENDOSCOPY;  Service: Cardiovascular;  Laterality: N/A;   CHOLECYSTECTOMY N/A  07/21/2013   Procedure: LAPAROSCOPIC CHOLECYSTECTOMY;  Surgeon: Mariella Saa, MD;  Location: [facility] OR;  Service: General;  Laterality: N/A;   COLONOSCOPY W/ POLYPECTOMY     COLONOSCOPY WITH PROPOFOL N/A 08/31/2016   Procedure: COLONOSCOPY WITH PROPOFOL;  Surgeon: Charlott Rakes, MD;  Location: [facility] ENDOSCOPY;  Service: Endoscopy;  Laterality: N/A;   CORONARY ARTERY BYPASS GRAFT  2006   DENTAL SURGERY     implants   INSERT / REPLACE / REMOVE PACEMAKER  2006, 2012   MDT for sick sinus syndrome, gen change 8/12 by JA   KIDNEY STONE SURGERY  1984    There were no vitals filed for this visit.   Subjective Assessment - 03/06/21 1534     Subjective  pt/wife verbalizes understanding of strategies for eating and initial HEP    Pertinent History Parkinson's Disease.   PMH:  hx of falls (wears helment), arthritis, asthma, CAD, GERD, HOH, HLD, HTN, hyperthyroidism, anxiety, aortic stenosis, hx of aortic valve replacement 2006, memory difficulty, pacemaker, persistent a-fib, hx of scarlet fever, shortness of breath, hx of CABG 2006, cholecystectomy 2015    Limitations fall risk (wears helment and pads), freezing, hard of hearing--reads lips (wear  clear mask)    Patient Stated Goals improve coordination    Currently in Pain? No/denies             Practiced simulated eating:  scooping dried beans, using fork to stab pieces of bread with cueing for strategies after education provided.  Educated pt/wife on importance of using large amplitude movement strategies and emphasizing slow, deliberate, big movements for ADLs/functional tasks with UEs to incr ease/safety, decr drops/spills.         OT Education - 03/06/21 1537     Education Details Strategies for eating & Coordination/UE functional movement HEP with emphasis on timing (slow, deliberate) and large amplitude movements--see pt instructions    Person(s) Educated Patient;Spouse    Methods Explanation;Demonstration;Handout;Verbal  cues;Tactile cues    Comprehension Verbalized understanding;Returned demonstration;Verbal cues required;Tactile cues required;Need further instruction              OT Short Term Goals - 02/25/21 2123       OT SHORT TERM GOAL #1   Title Pt will perform PD-specific HEP with cueing prn from caregiver--check STGs 03/27/21    Time 4    Period Weeks    Status New      OT SHORT TERM GOAL #2   Title Pt will improve R hand coordination for ADLs (buttoning/tying shoes) as shown by completing 9-hole peg test in 75sec or less.    Baseline 88.31sec    Time 4    Period Weeks    Status New      OT SHORT TERM GOAL #3   Title Pt will improve L hand coordination for ADLs (buttoning/tying shoes) as shown by completing 9-hole peg test in 90 sec or less.    Baseline 108.94sec    Time 4    Period Weeks    Status New      OT SHORT TERM GOAL #4   Title Pt/caregiver will verbalize understanding of ways to decr risk of future complcations related to PD (including falls).    Time 4    Period Weeks    Status New               OT Long Term Goals - 02/25/21 2128       OT LONG TERM GOAL #1   Title Pt/caregiver will verbalize understanding of AE/strategies to incr ease, safety, and independence with ADLs/IADLs.--check LTGs 04/26/21    Time 8    Period Weeks    Status New      OT LONG TERM GOAL #2   Title Pt will improve R hand coordination for ADLs (buttoning/tying shoes) as shown by completing 9-hole peg test in 65sec or less.    Time 8    Period Weeks    Status New      OT LONG TERM GOAL #3   Title Pt will improve L hand coordination for ADLs (buttoning/tying shoes) as shown by completing 9-hole peg test in 75 sec or less.    Time 8    Period Weeks    Status New      OT LONG TERM GOAL #4   Title Pt will improve functional reaching/coordination for ADLs as shown by improving score on box and blocks test by at least 5 with dominant LUE.    Baseline 32 blocks    Time 8    Period  Weeks    Status New      OT LONG TERM GOAL #5   Title Pt/caregiver will verbalize understanding of  appropriate community resources.    Time 8    Period Weeks    Status New                   Plan - 03/06/21 1535     Clinical Impression Statement Pt demo improved movement amplitude with cueing (demonstration, verbal, tactile) and repetition.  Pt tends to speed up, festinate with functional UE activities, particularly with repetition.    OT Occupational Profile and History Detailed Assessment- Review of Records and additional review of physical, cognitive, psychosocial history related to current functional performance    Occupational performance deficits (Please refer to evaluation for details): ADL's;IADL's;Leisure    Body Structure / Function / Physical Skills Balance;ADL;Decreased knowledge of use of DME;UE functional use;IADL;Improper spinal/pelvic alignment;Vision;Mobility;Coordination;Decreased knowledge of precautions;FMC    Rehab Potential Good    Clinical Decision Making Several treatment options, min-mod task modification necessary    Comorbidities Affecting Occupational Performance: May have comorbidities impacting occupational performance    Modification or Assistance to Complete Evaluation  Min-Moderate modification of tasks or assist with assess necessary to complete eval    OT Frequency 2x / week    OT Duration 8 weeks   +eval   OT Treatment/Interventions Self-care/ADL training;Moist Heat;DME and/or AE instruction;Therapeutic activities;Therapeutic exercise;Cognitive remediation/compensation;Visual/perceptual remediation/compensation;Passive range of motion;Functional Mobility Training;Neuromuscular education;Cryotherapy;Energy conservation;Manual Therapy;Patient/family education    Plan formally assess UE/shoulder ROM and rigidity and add LTG if appropriate; continue with strategies for ADLs (jacket, ?pants)    Consulted and Agree with Plan of Care Patient;Family  member/caregiver    Family Member Consulted wife             Patient will benefit from skilled therapeutic intervention in order to improve the following deficits and impairments:   Body Structure / Function / Physical Skills: Balance, ADL, Decreased knowledge of use of DME, UE functional use, IADL, Improper spinal/pelvic alignment, Vision, Mobility, Coordination, Decreased knowledge of precautions, FMC       Visit Diagnosis: Other symptoms and signs involving the nervous system  Abnormal posture  Unsteadiness on feet  Other lack of coordination  Tremor  Other abnormalities of gait and mobility  Visuospatial deficit    Problem List Patient Active Problem List   Diagnosis Date Noted   HOH (hard of hearing) 12/18/2020   Personal history of colonic polyps 08/31/2016   Abnormality of gait 08/27/2014   Memory difficulty 08/27/2014   Atrial fibrillation (HCC) 09/08/2013   Cholelithiases 07/19/2013   Cholelithiasis 07/19/2013   Encounter for therapeutic drug monitoring 07/03/2013   Aortic valve replaced 04/05/2013   Parkinson disease (HCC) 09/27/2012   Orthostatic hypotension 04/01/2012   Pacemaker-Medtronic 03/29/2012   HEPATITIS A 06/04/2010   HYPERTHYROIDISM 06/04/2010   HYPERLIPIDEMIA 06/04/2010   Essential hypertension 06/04/2010   CAD 06/04/2010   AORTIC STENOSIS 06/04/2010   SICK SINUS SYNDROME 06/04/2010   HIATAL HERNIA 06/04/2010   SHORTNESS OF BREATH 06/04/2010   CHEST PAIN 06/04/2010    Alexanderia Gorby, OT/L 03/06/2021, 3:39 PM  [facility] [address] [place], [place], [address] Phone: [phone]   Fax:  [phone]  Name: Donald [name] MRN: [id number] Date of Birth: 1941-05-15  Willa Frater, OTR/L [facility] [address] [place], [place]  [address] [phone] phone [phone] 03/06/21 3:39 PM

## 2021-03-06 NOTE — Patient Instructions (Signed)
     Eating strategies: Use a spoon instead of a fork to scoop food (like mashed potatoes, peas, corn, green beans, etc).   For things that you stab with a fork, make sure that the fork is straight up and down  Always hold spoon/fork in the middle and not the end of the handle.  Try foam grip on spoon/fork.  Hold fork with thumb up, hold spoon with thumb down    Coordination Activities:  Flipping Cards: Place deck of cards on the table. Flip cards over by opening your hand big to grasp and then turn your palm up big, opening hand fully to release.  Go slow  Open hand big to pick up coins and place in coin bank or container: Open hand big and pick up with big, intentional movements. Do not drag coin to the edge.  Place container out from you on table so that you have to extend elbow when reaching. Open hand big to pick up plastic cups/bottles with each hand.  Then open hand fully to release.   Open bottle caps/jars: Turn as much/as big as you can with each turn.  Move wrist.

## 2021-03-06 NOTE — Therapy (Addendum)
[facility] [address] [place], [place], [address] Phone: [phone]   Fax:  [phone]  Physical Therapy Treatment  Patient Details  Name: Donald [name] MRN: [id number] Date of Birth: Jun 22, 1940 Referring Provider (PT): Dr. Carles Collet   Encounter Date: 03/06/2021   PT End of Session - 03/06/21 0932     Visit Number 7    Number of Visits 17    Date for PT Re-Evaluation 04/29/21    Authorization Type HTA - $30 co pay    PT Start Time 0846    PT Stop Time 0929    PT Time Calculation (min) 43 min    Equipment Utilized During Treatment Gait belt    Activity Tolerance Patient tolerated treatment well    Behavior During Therapy [facility] for tasks assessed/performed;Impulsive             Past Medical History:  Diagnosis Date   Abnormality of gait 08/27/2014   Anxiety    Aortic stenosis    s/p AVR by Dr Cyndia Bent   Arthritis    left wrist- probable- not diagonised   Asthma    in the past   CAD (coronary artery disease)    s/p CABG 2006   GERD (gastroesophageal reflux disease)    H/O seasonal allergies    H/O: rheumatic fever    Hearing deficit    Bilateral hearing aids   Hematuria    had CT scan   Hiatal hernia    History of kidney stones    HOH (hard of hearing)    Hyperlipidemia    Hypertension    Hyperthyroidism    Inguinal hernia    right side; watching at this time per wife's report   Memory difficulty 08/27/2014   Pacemaker    Parkinson's disease    Persistent atrial fibrillation ([place])    Scarlet fever    as a child   Shortness of breath    Sick sinus syndrome ([place])    s/p PPM (MDT) 2006    Past Surgical History:  Procedure Laterality Date   AORTIC VALVE REPLACEMENT  2006   CARDIOVERSION N/A 09/14/2013   Procedure: CARDIOVERSION;  Surgeon: Sanda Klein, MD;  Location: [facility] ENDOSCOPY;  Service: Cardiovascular;  Laterality: N/A;   CHOLECYSTECTOMY N/A 07/21/2013   Procedure: LAPAROSCOPIC  CHOLECYSTECTOMY;  Surgeon: Edward Jolly, MD;  Location: [facility];  Service: General;  Laterality: N/A;   COLONOSCOPY W/ POLYPECTOMY     COLONOSCOPY WITH PROPOFOL N/A 08/31/2016   Procedure: COLONOSCOPY WITH PROPOFOL;  Surgeon: Wilford Corner, MD;  Location: [facility] ENDOSCOPY;  Service: Endoscopy;  Laterality: N/A;   CORONARY ARTERY BYPASS GRAFT  2006   DENTAL SURGERY     implants   INSERT / REPLACE / REMOVE PACEMAKER  2006, 2012   MDT for sick sinus syndrome, gen change 8/12 by [place]    There were no vitals filed for this visit.   Subjective Assessment - 03/06/21 0853     Subjective Had 2 falls yesterday - one was when he was out working in the yard. The other one was when he was turning too quickly. Did not hurt himself.    Pertinent History PMH: PD, CAD (s/p CABG 2006), LD, HTN, a fib, pacemaker, GERD, on anticoagulants    Limitations Walking;House hold activities;Standing    Patient Stated Goals wants to improve his balance    Currently in Pain? No/denies  [place] Adult PT Treatment/Exercise - 03/06/21 0854       Transfers   Transfers Sit to Stand;Stand to Sit    Sit to Stand 5: Supervision;With upper extremity assist;Without upper extremity assist;From bed;From chair/3-in-1    Sit to Stand Details Visual cues/gestures for sequencing;Verbal cues for sequencing;Verbal cues for technique    Stand to Sit 5: Supervision;Without upper extremity assist;With upper extremity assist    Comments Performed multiple sit <> stands throughout session with pt performing with good technique and incr forwards weight shift. performed TUG shuttle over 15' down and back 3 reps with RW. Pt needing cues initially in standing to rock and then perform with big steps to red tband, pt needing max cues to perform marching with RW when turning. Pt needing cues to discuss that marching is a technique to help with turning vs. marching  during gait      Ambulation/Gait   Ambulation/Gait Yes    Ambulation/Gait Assistance 4: Min guard    Ambulation/Gait Assistance Details Cues that when standing to rock through hips first prior to initiating gait, intermittent cues throughout to take BIG steps to the red (tband). No episodes of festination episodes today. Had pt's spouse practice one lap of gait with pt working on same cues    Ambulation Distance (Feet) 230 Feet   x1, 115 x 1   Assistive device Rolling walker    Gait Pattern Step-through pattern;Step-to pattern;Decreased stride length;Decreased dorsiflexion - right;Decreased dorsiflexion - left;Shuffle;Festinating;Trunk flexed;Poor foot clearance - left;Poor foot clearance - right    Ambulation Surface Level;Indoor      Neuro Re-ed    Neuro Re-ed Details  Standing with BUE support on chair: marching to boomwhacker x10 reps each side - cues for ROM and slowed pace, lateral step and weight shift x10 reps each side to target on floor, forward/diagonal step x10 reps each side - incr difficulty with LLE, wide BOS lateral weight shifting x10 reps                     PT Education - 03/06/21 1537     Education Details Discussed that pt would be best served at this location for neuro rehab due to PD certified OT therapists, pt and pt's spouse in agreement with plan.    Person(s) Educated Patient;Spouse    Methods Explanation    Comprehension Verbalized understanding              PT Short Term Goals - 02/25/21 1419       PT SHORT TERM GOAL #1   Title Pt will perform initial HEP with supervision from pt's spouse for strength, balance, and transfers in order to build upon functional gains made in therapy. ALL STGS DUE 02/26/21    Time 4    Period Weeks    Status New    Target Date 02/26/21      PT SHORT TERM GOAL #2   Title Pt and pt's spouse will verbalize understanding of fall prevention in the home.    Time 4    Period Weeks    Status New      PT SHORT  TERM GOAL #3   Title Pt and pt's spouse will verbalize understanding of techniques to help with freezing/festination episodes in order to demo improved safety with gait.    Baseline Reviewed today in session on 02/25/21 - will continue to benefit from additional practice/repetition with wife practicing cues with pt    Time 4  Period Weeks    Status Partially Met      PT SHORT TERM GOAL #4   Title Pt will perform TUG with RW vs. UStep Walker in 21 seconds or less in order to demo decr fall risk.    Baseline 23.75 seconds, 51 seconds first attempt 42 seconds 2nd attempt    Time 4    Period Weeks    Status Not Met      PT SHORT TERM GOAL #5   Title Pt will perform 5 reps of sit <> stand with BUE support with proper technique and without BLE bracing in order to demo improved safety with functional transfers.    Baseline met on 02/25/21    Time 4    Period Weeks    Status Achieved               PT Long Term Goals - 01/29/21 0947       PT LONG TERM GOAL #1   Title Pt will perform final HEP with supervision from pt's spouse for strength, balance, and transfers in order to build upon functional gains made in therapy. ALL LTGS DUE 03/26/21    Time 8    Period Weeks    Status New    Target Date 03/26/21      PT LONG TERM GOAL #2   Title Pt and pt's spouse will verbalize understanding of local PD resources.    Time 8    Period Weeks    Status New      PT LONG TERM GOAL #3   Title Pt will improve gait speed with RW vs. Ustep walker to at least 2.7 ft/sec for improved community mobility.    Baseline 2.49 ft/sec    Time 8    Period Weeks    Status New      PT LONG TERM GOAL #4   Title Pt will perform 5x sit <> stand in 18 seconds or less with UE support in order to demo improved functional transfers and decr fall risk.    Baseline 22.22 seconds, decr eccentric control    Time 8    Period Weeks    Status New      PT LONG TERM GOAL #5   Title Pt will perform TUG with RW  vs. UStep Walker in 18.5 seconds or less in order to demo decr fall risk.    Baseline 23.75 seconds with RW    Time 8    Period Weeks    Status New      Additional Long Term Goals   Additional Long Term Goals Yes      PT LONG TERM GOAL #6   Title Pt will ambulate at least 230' with RW vs. UStep walker with supervision in order to demo improved household mobility.    Baseline needs min guard/min A with RW    Time 8    Period Weeks    Status New                   Plan - 03/06/21 1538     Clinical Impression Statement Pt demonstrating good understanding of sit <> stands throughout session today with proper technique. Pt needing reminder demo/verbal cues to perform rocking before initiating gait when taking a longer step. No festination episodes throughout session today, practiced with cues with taking big steps to the red (theraband on RW), with wife able to demo understanding and also practice using these cues. Pt does need  more continued practice with marching turns with RW. Will continue to progress towards LTGs.    Personal Factors and Comorbidities Comorbidity 3+;Time since onset of injury/illness/exacerbation;Past/Current Experience;Behavior Pattern    Comorbidities PD (diagnosed 2012), CAD (s/p CABG 2006), LD, HTN, a fib, pacemaker, GERD, on anticoagulants    Examination-Activity Limitations Bed Mobility;Caring for Others;Toileting;Transfers;Stairs;Locomotion Level;Stand;Squat;Reach Overhead    Designer, fashion/clothing;Yard Work    [employer]chant [employer] officer Evolving/Moderate complexity    Rehab Potential Good    PT Frequency 2x / week    PT Duration 12 weeks    PT Treatment/Interventions ADLs/Self Care Home Management;DME Instruction;Therapeutic activities;Functional mobility training;Gait training;Stair training;Therapeutic exercise;Balance training;Neuromuscular re-education;Patient/family education;Energy  conservation;Vestibular    PT Next Visit Plan check remainder of STGs. Pt is very hard of hearing even with hearing aids (need to wear clear mask).   Continue gait and turning training with RW - working on cues for incr stride length and working on slowed pace, pt seemed to do well with use of tband as visual cue. Continue to use wide BOS lateral weightshifting to initiatie gait. standing foot clearance/stepping strategy activities.    Consulted and Agree with Plan of Care Patient;Family member/caregiver    Family Member Consulted pt's [name]             Patient will benefit from skilled therapeutic intervention in order to improve the following deficits and impairments:  Abnormal gait, Decreased activity tolerance, Decreased coordination, Decreased balance, Decreased endurance, Decreased knowledge of use of DME, Decreased safety awareness, Decreased mobility, Decreased strength, Difficulty walking, Impaired flexibility, Postural dysfunction  Visit Diagnosis: Other symptoms and signs involving the nervous system  Abnormal posture  Unsteadiness on feet     Problem List Patient Active Problem List   Diagnosis Date Noted   HOH (hard of hearing) 12/18/2020   Personal history of colonic polyps 08/31/2016   Abnormality of gait 08/27/2014   Memory difficulty 08/27/2014   Atrial fibrillation ([place]) 09/08/2013   Cholelithiases 07/19/2013   Cholelithiasis 07/19/2013   Encounter for therapeutic drug monitoring 07/03/2013   Aortic valve replaced 04/05/2013   Parkinson disease ([place]) 09/27/2012   Orthostatic hypotension 04/01/2012   Pacemaker-Medtronic 03/29/2012   HEPATITIS A 06/04/2010   HYPERTHYROIDISM 06/04/2010   HYPERLIPIDEMIA 06/04/2010   Essential hypertension 06/04/2010   CAD 06/04/2010   AORTIC STENOSIS 06/04/2010   SICK SINUS SYNDROME 06/04/2010   HIATAL HERNIA 06/04/2010   SHORTNESS OF BREATH 06/04/2010   CHEST PAIN 06/04/2010    Arliss Journey, PT, DPT   03/06/2021, 3:40 PM  [facility] [address] [place], [place], [address] Phone: [phone]   Fax:  [phone]  Name: Donald [name] MRN: [id number] Date of Birth: December 26, 1940

## 2021-03-11 ENCOUNTER — Encounter: Payer: Self-pay | Admitting: Occupational Therapy

## 2021-03-11 ENCOUNTER — Ambulatory Visit: Payer: PPO | Admitting: Physical Therapy

## 2021-03-11 ENCOUNTER — Ambulatory Visit: Payer: PPO | Admitting: Occupational Therapy

## 2021-03-11 ENCOUNTER — Encounter: Payer: Self-pay | Admitting: Physical Therapy

## 2021-03-11 ENCOUNTER — Other Ambulatory Visit: Payer: Self-pay

## 2021-03-11 DIAGNOSIS — R293 Abnormal posture: Secondary | ICD-10-CM

## 2021-03-11 DIAGNOSIS — R2689 Other abnormalities of gait and mobility: Secondary | ICD-10-CM | POA: Diagnosis not present

## 2021-03-11 DIAGNOSIS — R278 Other lack of coordination: Secondary | ICD-10-CM

## 2021-03-11 DIAGNOSIS — R251 Tremor, unspecified: Secondary | ICD-10-CM

## 2021-03-11 DIAGNOSIS — R2681 Unsteadiness on feet: Secondary | ICD-10-CM

## 2021-03-11 DIAGNOSIS — R41842 Visuospatial deficit: Secondary | ICD-10-CM

## 2021-03-11 DIAGNOSIS — R29818 Other symptoms and signs involving the nervous system: Secondary | ICD-10-CM

## 2021-03-11 NOTE — Therapy (Signed)
[facility] [address] [place], [place], [address] Phone: [phone]   Fax:  [phone]  Occupational Therapy Treatment  Patient Details  Name: Donald [name] MRN: [id number] Date of Birth: 1941/01/21 Referring Provider (OT): Dr. Lurena Joiner Tat   Encounter Date: 03/11/2021   OT End of Session - 03/11/21 1109     Visit Number 3    Number of Visits 17    Date for OT Re-Evaluation 04/26/21    Authorization Type [employer], follow Medicare guidelines, copay per day    Authorization - Visit Number 3    Authorization - Number of Visits 10    Progress Note Due on Visit 10    OT Start Time 1105    OT Stop Time 1145    OT Time Calculation (min) 40 min    Activity Tolerance Patient tolerated treatment well    Behavior During Therapy Impulsive   timing deficits            Past Medical History:  Diagnosis Date   Abnormality of gait 08/27/2014   Anxiety    Aortic stenosis    s/p AVR by Dr Laneta Simmers   Arthritis    left wrist- probable- not diagonised   Asthma    in the past   CAD (coronary artery disease)    s/p CABG 2006   GERD (gastroesophageal reflux disease)    H/O seasonal allergies    H/O: rheumatic fever    Hearing deficit    Bilateral hearing aids   Hematuria    had CT scan   Hiatal hernia    History of kidney stones    HOH (hard of hearing)    Hyperlipidemia    Hypertension    Hyperthyroidism    Inguinal hernia    right side; watching at this time per wife's report   Memory difficulty 08/27/2014   Pacemaker    Parkinson's disease    Persistent atrial fibrillation ([facility])    Scarlet fever    as a child   Shortness of breath    Sick sinus syndrome ([facility])    s/p PPM (MDT) 2006    Past Surgical History:  Procedure Laterality Date   AORTIC VALVE REPLACEMENT  2006   CARDIOVERSION N/A 09/14/2013   Procedure: CARDIOVERSION;  Surgeon: Thurmon Fair, MD;  Location: [facility] ENDOSCOPY;  Service:  Cardiovascular;  Laterality: N/A;   CHOLECYSTECTOMY N/A 07/21/2013   Procedure: LAPAROSCOPIC CHOLECYSTECTOMY;  Surgeon: Mariella Saa, MD;  Location: [facility] OR;  Service: General;  Laterality: N/A;   COLONOSCOPY W/ POLYPECTOMY     COLONOSCOPY WITH PROPOFOL N/A 08/31/2016   Procedure: COLONOSCOPY WITH PROPOFOL;  Surgeon: Charlott Rakes, MD;  Location: [facility] ENDOSCOPY;  Service: Endoscopy;  Laterality: N/A;   CORONARY ARTERY BYPASS GRAFT  2006   DENTAL SURGERY     implants   INSERT / REPLACE / REMOVE PACEMAKER  2006, 2012   MDT for sick sinus syndrome, gen change 8/12 by [facility]   KIDNEY STONE SURGERY  1984    There were no vitals filed for this visit.   Subjective Assessment - 03/11/21 1107     Subjective  tying shoes is difficult and fastening button on pants.  pt reports eating strategies help.  Swallow study tomorrow.    Pertinent History Parkinson's Disease.   PMH:  hx of falls (wears helment), arthritis, asthma, CAD, GERD, HOH, HLD, HTN, hyperthyroidism, anxiety, aortic stenosis, hx of aortic valve replacement 2006, memory difficulty, pacemaker, persistent a-fib,  hx of scarlet fever, shortness of breath, hx of CABG 2006, cholecystectomy 2015    Limitations fall risk (wears helment and pads), freezing, hard of hearing--reads lips (wear clear mask)    Patient Stated Goals improve coordination    Currently in Pain? Yes    Pain Score --   "a little"   Pain Location Wrist    Pain Orientation Right    Pain Type Chronic pain    Pain Onset More than a month ago    Pain Frequency Intermittent    Aggravating Factors  holding walker    Pain Relieving Factors rest               Noted with ambulation with RW:  pt placing R hand flat on RW versus holding handle.  Pt reports pain with this at times and walker drifting to the left.  Pt with wrist ROM grossly WFL, but with possible dystonia vs rigidity.  Discussed possible walker splint for R hand?   Will continue to monitor.   Asked pt/wife to  note if pain incr with certain times of day or activity.  Grasping and placing various-sized pegs in arc pegboard with mod-max cueing for PWR! Hands prior to grasp, and cueing to avoid shoulder IR/compensation, and to grasp objects in pads of fingers vs fingertips due to decr control.  Then removing with min cueing for large amplitude movement strategies.  Sit>stand with min-mod cueing for large amplitude movement strategies, feet apart, forward wt. Shift, and use of UE support.     Donning/doffing jacket in standing with supervision x2.  Pt given min cues to stand with feet apart and reach/grasp with open hand.    Trial of strategies for fastening buttons:  PWR! Hands, adjusting hand grasp.  Pt completed x3, but continues to demo difficulty.  Dyskinesia and dystonia appeared to impact.       [facility] OT Assessment - 03/11/21 0001       Tone   Assessment Location Right Upper Extremity;Left Upper Extremity      AROM   Overall AROM  Within functional limits for tasks performed    Overall AROM Comments BUEs      RUE Tone   RUE Tone --   very mild     LUE Tone   LUE Tone Mild                                OT Short Term Goals - 02/25/21 2123       OT SHORT TERM GOAL #1   Title Pt will perform PD-specific HEP with cueing prn from caregiver--check STGs 03/27/21    Time 4    Period Weeks    Status New      OT SHORT TERM GOAL #2   Title Pt will improve R hand coordination for ADLs (buttoning/tying shoes) as shown by completing 9-hole peg test in 75sec or less.    Baseline 88.31sec    Time 4    Period Weeks    Status New      OT SHORT TERM GOAL #3   Title Pt will improve L hand coordination for ADLs (buttoning/tying shoes) as shown by completing 9-hole peg test in 90 sec or less.    Baseline 108.94sec    Time 4    Period Weeks    Status New      OT SHORT TERM GOAL #4   Title Pt/caregiver  will verbalize understanding of ways to decr risk of future  complcations related to PD (including falls).    Time 4    Period Weeks    Status New               OT Long Term Goals - 02/25/21 2128       OT LONG TERM GOAL #1   Title Pt/caregiver will verbalize understanding of AE/strategies to incr ease, safety, and independence with ADLs/IADLs.--check LTGs 04/26/21    Time 8    Period Weeks    Status New      OT LONG TERM GOAL #2   Title Pt will improve R hand coordination for ADLs (buttoning/tying shoes) as shown by completing 9-hole peg test in 65sec or less.    Time 8    Period Weeks    Status New      OT LONG TERM GOAL #3   Title Pt will improve L hand coordination for ADLs (buttoning/tying shoes) as shown by completing 9-hole peg test in 75 sec or less.    Time 8    Period Weeks    Status New      OT LONG TERM GOAL #4   Title Pt will improve functional reaching/coordination for ADLs as shown by improving score on box and blocks test by at least 5 with dominant LUE.    Baseline 32 blocks    Time 8    Period Weeks    Status New      OT LONG TERM GOAL #5   Title Pt/caregiver will verbalize understanding of appropriate community resources.    Time 8    Period Weeks    Status New                   Plan - 03/11/21 1158     Clinical Impression Statement Pt demo improved movement amplitude with cueing (demo, verbal, tactile) and repetition.  Pt demo shoulder compensation (IR) for fine motor tasks and needed cueing for this.  Pt also demo some dyskinesias and possible dystonia that may be contributing to functional difficulty.    OT Occupational Profile and History Detailed Assessment- Review of Records and additional review of physical, cognitive, psychosocial history related to current functional performance    Occupational performance deficits (Please refer to evaluation for details): ADL's;IADL's;Leisure    Body Structure / Function / Physical Skills Balance;ADL;Decreased knowledge of use of DME;UE functional  use;IADL;Improper spinal/pelvic alignment;Vision;Mobility;Coordination;Decreased knowledge of precautions;FMC    Rehab Potential Good    Clinical Decision Making Several treatment options, min-mod task modification necessary    Comorbidities Affecting Occupational Performance: May have comorbidities impacting occupational performance    Modification or Assistance to Complete Evaluation  Min-Moderate modification of tasks or assist with assess necessary to complete eval    OT Frequency 2x / week    OT Duration 8 weeks   +eval   OT Treatment/Interventions Self-care/ADL training;Moist Heat;DME and/or AE instruction;Therapeutic activities;Therapeutic exercise;Cognitive remediation/compensation;Visual/perceptual remediation/compensation;Passive range of motion;Functional Mobility Training;Neuromuscular education;Cryotherapy;Energy conservation;Manual Therapy;Patient/family education    Plan continue with strategies for ADLs (button on pants, tying shoes), coordination    Consulted and Agree with Plan of Care Patient;Family member/caregiver    Family Member Consulted wife             Patient will benefit from skilled therapeutic intervention in order to improve the following deficits and impairments:   Body Structure / Function / Physical Skills: Balance, ADL, Decreased knowledge of use of DME,  UE functional use, IADL, Improper spinal/pelvic alignment, Vision, Mobility, Coordination, Decreased knowledge of precautions, FMC       Visit Diagnosis: Other symptoms and signs involving the nervous system  Abnormal posture  Unsteadiness on feet  Other abnormalities of gait and mobility  Other lack of coordination  Tremor  Visuospatial deficit    Problem List Patient Active Problem List   Diagnosis Date Noted   HOH (hard of hearing) 12/18/2020   Personal history of colonic polyps 08/31/2016   Abnormality of gait 08/27/2014   Memory difficulty 08/27/2014   Atrial fibrillation ([facility])  09/08/2013   Cholelithiases 07/19/2013   Cholelithiasis 07/19/2013   Encounter for therapeutic drug monitoring 07/03/2013   Aortic valve replaced 04/05/2013   Parkinson disease ([facility]) 09/27/2012   Orthostatic hypotension 04/01/2012   Pacemaker-Medtronic 03/29/2012   HEPATITIS A 06/04/2010   HYPERTHYROIDISM 06/04/2010   HYPERLIPIDEMIA 06/04/2010   Essential hypertension 06/04/2010   CAD 06/04/2010   AORTIC STENOSIS 06/04/2010   SICK SINUS SYNDROME 06/04/2010   HIATAL HERNIA 06/04/2010   SHORTNESS OF BREATH 06/04/2010   CHEST PAIN 06/04/2010    Ammarie Matsuura, OT/L 03/11/2021, 3:30 PM  [facility] [address] [place], [place], [address] Phone: [phone]   Fax:  [phone]  Name: Donald [name] MRN: [id number] Date of Birth: March 11, 1941   Willa Frater, OTR/L [facility] [address] [place], [place]  [address] [phone] phone [phone] 03/11/21 3:30 PM

## 2021-03-11 NOTE — Therapy (Signed)
[facility] [address] [place], [place], [address] Phone: [phone]   Fax:  [phone]  Physical Therapy Treatment  Patient Details  Name: Donald [name] MRN: [id number] Date of Birth: [birth year] Referring Provider (PT): Dr. Carles Collet   Encounter Date: 03/11/2021   PT End of Session - 03/11/21 1024     Visit Number 8    Number of Visits 17    Date for PT Re-Evaluation 04/29/21    Authorization Type HTA - $30 co pay    PT Start Time 1018    PT Stop Time 1102    PT Time Calculation (min) 44 min    Equipment Utilized During Treatment Gait belt    Activity Tolerance Patient tolerated treatment well    Behavior During Therapy [facility] for tasks assessed/performed;Impulsive             Past Medical History:  Diagnosis Date   Abnormality of gait 08/27/2014   Anxiety    Aortic stenosis    s/p AVR by Dr Cyndia Bent   Arthritis    left wrist- probable- not diagonised   Asthma    in the past   CAD (coronary artery disease)    s/p CABG 2006   GERD (gastroesophageal reflux disease)    H/O seasonal allergies    H/O: rheumatic fever    Hearing deficit    Bilateral hearing aids   Hematuria    had CT scan   Hiatal hernia    History of kidney stones    HOH (hard of hearing)    Hyperlipidemia    Hypertension    Hyperthyroidism    Inguinal hernia    right side; watching at this time per wife's report   Memory difficulty 08/27/2014   Pacemaker    Parkinson's disease    Persistent atrial fibrillation ([place])    Scarlet fever    as a child   Shortness of breath    Sick sinus syndrome ([place])    s/p PPM (MDT) 2006    Past Surgical History:  Procedure Laterality Date   AORTIC VALVE REPLACEMENT  2006   CARDIOVERSION N/A 09/14/2013   Procedure: CARDIOVERSION;  Surgeon: Sanda Klein, MD;  Location: [facility] ENDOSCOPY;  Service: Cardiovascular;  Laterality: N/A;   CHOLECYSTECTOMY N/A 07/21/2013   Procedure: LAPAROSCOPIC  CHOLECYSTECTOMY;  Surgeon: Edward Jolly, MD;  Location: [facility];  Service: General;  Laterality: N/A;   COLONOSCOPY W/ POLYPECTOMY     COLONOSCOPY WITH PROPOFOL N/A 08/31/2016   Procedure: COLONOSCOPY WITH PROPOFOL;  Surgeon: Wilford Corner, MD;  Location: [facility] END[facility]COPY;  Service: Endoscopy;  Laterality: N/A;   CORONARY ARTERY BYPASS GRAFT  2006   DENTAL SURGERY     implants   INSERT / REPLACE / REMOVE PACEMAKER  2006, 2012   MDT for sick sinus syndrome, gen change 8/12 by Door    There were no vitals filed for this visit.   Subjective Assessment - 03/11/21 1022     Subjective Has had 2 falls since he was last here - one was when he was turning too fast in the yard and another he was standing and trying to take his shoes off.    Pertinent History PMH: PD, CAD (s/p CABG 2006), LD, HTN, a fib, pacemaker, GERD, on anticoagulants    Limitations Walking;House hold activities;Standing    Patient Stated Goals wants to improve his balance    Currently in Pain? No/denies  [facility] PT Treatment/Exercise - 03/11/21 1039       Transfers   Transfers Sit to Stand;Stand to Sit    Sit to Stand 5: Supervision;With upper extremity assist;Without upper extremity assist;From bed;From chair/3-in-1    Sit to Stand Details Visual cues/gestures for sequencing;Verbal cues for sequencing;Verbal cues for technique    Stand to Sit 5: Supervision;Without upper extremity assist;With upper extremity assist    Transfer Cueing When turning with RW to go back to mat table, cues to turn RW and perform big march step to help with foot clearance    Comments Performed multiple reps throughout session with pt able to use proper technique/form      Ambulation/Gait   Ambulation/Gait Yes    Ambulation/Gait Assistance 4: Min guard    Ambulation/Gait Assistance Details With RW - initial cues to stop and weight shift through hips initially  in standing and then take a BIG step to initiate gait. Cues for BIG steps/kicking legs out throughout gait. Pt with decr heel strike during gait RLE>LLE. Pt able to grip RW more with LUE and resting RUE flat on handle. Discussed that this is likely the reason that pt feels like his walker is going more to the L. Pt reports that he feels like his R wrist hurts when he is holding onto the walker, cued throughout gait for pt to grip walker with R hand. Also discussed this with OT and pt's start of OT session.    Ambulation Distance (Feet) 115 Feet   x2   Assistive device Rolling walker    Gait Pattern Step-through pattern;Step-to pattern;Decreased stride length;Decreased dorsiflexion - right;Decreased dorsiflexion - left;Shuffle;Festinating;Trunk flexed;Poor foot clearance - left;Poor foot clearance - right    Ambulation Surface Level;Indoor    Gait Comments With RW, working on wide U turns x5 reps with cues for incr stride length and kicking towards the red band, Reviewed that this is a technique for turning in wider/larger spaces to help keep incr step length      Neuro Re-ed    Neuro Re-ed Details  Standing with BUE support on chair: lateral weight shifting x10 reps, lateral stepping over yardstick x10 reps each leg for incr foot clearance/weight shifting - incr difficulty with LLE, practiced quarter turns with big steps to colorful floor targets as if faces on a clock x10 reps each direction - cues for technique and foot clearance, alternating forward stepping and weight shift to colorful floor disc x12 reps each leg. Pt with tendency to stay on R toes at times vs. foot flat contact on floor                     PT Education - 03/11/21 1026     Education Details Provided handout on fall prevention              PT Short Term Goals - 03/11/21 1117       PT SHORT TERM GOAL #1   Title Pt will perform initial HEP with supervision from pt's spouse for strength, balance, and transfers  in order to build upon functional gains made in therapy. ALL STGS DUE 02/26/21    Time 4    Period Weeks    Status New    Target Date 02/26/21      PT SHORT TERM GOAL #2   Title Pt and pt's spouse will verbalize understanding of fall prevention in the home.    Baseline provided handout on 03/11/21  Time 4    Period Weeks    Status Achieved      PT SHORT TERM GOAL #3   Title Pt and pt's spouse will verbalize understanding of techniques to help with freezing/festination episodes in order to demo improved safety with gait.    Baseline Reviewed today in session on 02/25/21 - will continue to benefit from additional practice/repetition with wife practicing cues with pt    Time 4    Period Weeks    Status Partially Met      PT SHORT TERM GOAL #4   Title Pt will perform TUG with RW vs. UStep Walker in 21 seconds or less in order to demo decr fall risk.    Baseline 23.75 seconds, 51 seconds first attempt 42 seconds 2nd attempt    Time 4    Period Weeks    Status Not Met      PT SHORT TERM GOAL #5   Title Pt will perform 5 reps of sit <> stand with BUE support with proper technique and without BLE bracing in order to demo improved safety with functional transfers.    Baseline met on 02/25/21    Time 4    Period Weeks    Status Achieved               PT Long Term Goals - 01/29/21 0947       PT LONG TERM GOAL #1   Title Pt will perform final HEP with supervision from pt's spouse for strength, balance, and transfers in order to build upon functional gains made in therapy. ALL LTGS DUE 03/26/21    Time 8    Period Weeks    Status New    Target Date 03/26/21      PT LONG TERM GOAL #2   Title Pt and pt's spouse will verbalize understanding of local PD resources.    Time 8    Period Weeks    Status New      PT LONG TERM GOAL #3   Title Pt will improve gait speed with RW vs. Ustep walker to at least 2.7 ft/sec for improved community mobility.    Baseline 2.49 ft/sec     Time 8    Period Weeks    Status New      PT LONG TERM GOAL #4   Title Pt will perform 5x sit <> stand in 18 seconds or less with UE support in order to demo improved functional transfers and decr fall risk.    Baseline 22.22 seconds, decr eccentric control    Time 8    Period Weeks    Status New      PT LONG TERM GOAL #5   Title Pt will perform TUG with RW vs. UStep Walker in 18.5 seconds or less in order to demo decr fall risk.    Baseline 23.75 seconds with RW    Time 8    Period Weeks    Status New      Additional Long Term Goals   Additional Long Term Goals Yes      PT LONG TERM GOAL #6   Title Pt will ambulate at least 230' with RW vs. UStep walker with supervision in order to demo improved household mobility.    Baseline needs min guard/min A with RW    Time 8    Period Weeks    Status New  Plan - 03/11/21 1117     Clinical Impression Statement Initiated session with standing weight shifting and foot clearance/stepping activities over obstacles and to targets as a visual cue. Pt with incr dificulty with foot clearance with LLE. Continued to work on gait training with taking big steps to the red tband as visual cue. Pt noted more today to not grasp RW with RUE (instead tends to just place RUE on walker), pt reporting incr wrist pain when trying to grip it. Discussed this with OT. Will continue to progress towards LTGs.    Personal Factors and Comorbidities Comorbidity 3+;Time since onset of injury/illness/exacerbation;Past/Current Experience;Behavior Pattern    Comorbidities PD (diagnosed 2012), CAD (s/p CABG 2006), LD, HTN, a fib, pacemaker, GERD, on anticoagulants    Examination-Activity Limitations Bed Mobility;Caring for Others;Toileting;Transfers;Stairs;Locomotion Level;Stand;Squat;Reach Overhead    Designer, fashion/clothing;Yard Work    [employer]chant [employer] officer  Evolving/Moderate complexity    Rehab Potential Good    PT Frequency 2x / week    PT Duration 12 weeks    PT Treatment/Interventions ADLs/Self Care Home Management;DME Instruction;Therapeutic activities;Functional mobility training;Gait training;Stair training;Therapeutic exercise;Balance training;Neuromuscular re-education;Patient/family education;Energy conservation;Vestibular    PT Next Visit Plan Pt is very hard of hearing even with hearing aids (need to wear clear mask).   Continue gait and turning training with RW - working on cues for incr stride length and working on slowed pace, using red tband as big kicks/steps to the red. Continue to use wide BOS lateral weightshifting to initiatie gait. standing foot clearance/stepping strategy activities and weight shifting. See if pt can get started on a walking program for home down hallway with wife cueing working on incr step length    Consulted and Agree with Plan of Care Patient;Family member/caregiver    Family Member Consulted pt's [name]             Patient will benefit from skilled therapeutic intervention in order to improve the following deficits and impairments:  Abnormal gait, Decreased activity tolerance, Decreased coordination, Decreased balance, Decreased endurance, Decreased knowledge of use of DME, Decreased safety awareness, Decreased mobility, Decreased strength, Difficulty walking, Impaired flexibility, Postural dysfunction  Visit Diagnosis: Other symptoms and signs involving the nervous system  Abnormal posture  Unsteadiness on feet  Other abnormalities of gait and mobility     Problem List Patient Active Problem List   Diagnosis Date Noted   HOH (hard of hearing) 12/18/2020   Personal history of colonic polyps 08/31/2016   Abnormality of gait 08/27/2014   Memory difficulty 08/27/2014   Atrial fibrillation ([place]) 09/08/2013   Cholelithiases 07/19/2013   Cholelithiasis 07/19/2013   Encounter for  therapeutic drug monitoring 07/03/2013   Aortic valve replaced 04/05/2013   Parkinson disease ([place]) 09/27/2012   Orthostatic hypotension 04/01/2012   Pacemaker-[employer]tronic 03/29/2012   HEPATITIS A 06/04/2010   HYPERTHYROIDISM 06/04/2010   HYPERLIPIDEMIA 06/04/2010   Essential hypertension 06/04/2010   CAD 06/04/2010   AORTIC STENOSIS 06/04/2010   SICK SINUS SYNDROME 06/04/2010   HIATAL HERNIA 06/04/2010   SHORTNESS OF BREATH 06/04/2010   CHEST PAIN 06/04/2010    Arliss Journey, PT, DPT  03/11/2021, 11:20 AM  [place] [facility] [facility] [place] [facility] [address] [place], [place], [address] Phone: [phone]   Fax:  [phone]  Name: Donald [name] MRN: [id number] Date of Birth: 1941-01-11

## 2021-03-11 NOTE — Patient Instructions (Signed)

## 2021-03-12 ENCOUNTER — Ambulatory Visit (HOSPITAL_COMMUNITY)
Admission: RE | Admit: 2021-03-12 | Discharge: 2021-03-12 | Disposition: A | Discharge status: Home / Self Care | Payer: PPO | Source: Ambulatory Visit | Attending: Neurology | Admitting: Neurology

## 2021-03-12 DIAGNOSIS — R1312 Dysphagia, oropharyngeal phase: Secondary | ICD-10-CM | POA: Insufficient documentation

## 2021-03-12 DIAGNOSIS — R1310 Dysphagia, unspecified: Secondary | ICD-10-CM | POA: Diagnosis not present

## 2021-03-13 ENCOUNTER — Ambulatory Visit: Payer: PPO | Admitting: Physical Therapy

## 2021-03-14 ENCOUNTER — Ambulatory Visit: Payer: PPO | Admitting: Physical Therapy

## 2021-03-17 DIAGNOSIS — E7800 Pure hypercholesterolemia, unspecified: Secondary | ICD-10-CM | POA: Diagnosis not present

## 2021-03-17 DIAGNOSIS — E039 Hypothyroidism, unspecified: Secondary | ICD-10-CM | POA: Diagnosis not present

## 2021-03-17 DIAGNOSIS — I119 Hypertensive heart disease without heart failure: Secondary | ICD-10-CM | POA: Diagnosis not present

## 2021-03-17 NOTE — Progress Notes (Signed)
Assessment/Plan:   1.  Parkinsons Disease  -continue carbidopa/levodopa 25/100, 2 tablets @ 7am / 9:30 am/ 12 pm /2:30 pm /5 pm and 7:30 pm   -Continue carbidopa/levodopa 50/200 CR at bedtime  -I discussed with patient and wife that I do not want them to worry about dyskinesia right now.  It is not bothersome to patient.  He is moving better.  Certainly do not want to add any medication for this.  -wean off of entacapone  -For now we will continue ropinirole 3 mg 3 times per day.  The goal next visit may be to start weaning this   2.  Memory change  -May have PDD, but suspect entacapone, ropinirole and hearing change are all contributing issues. -Working on changing his medicines the last few visits.  3.  Dysphagia  -pt had mbe on 10/26 with mod oropharyngeal dysphagia.  Felt to be severe aspiration risk.  He is scheduled for ST  4.  Hand paresthesias, likely some neuropathy  -Discussed with patient and wife that I really do not want to add more medication.  I think that this will only cause more confusion.  Discussed with them that they could try compression gloves, since nighttime is a big problem for him.  We discussed over-the-counter lidocaine cream as well.   Subjective:   Donald [name] was seen today in follow up for Parkinsons disease.  My previous records were reviewed prior to todays visit as well as outside records available to me. Pt with wife who supplements the history.   Last visit, we discontinued his carbidopa/levodopa 25/250s and switched him all to 25/100 .  We also added carbidopa/levodopa 50/200 CR at bedtime.  We did discuss last visit that we may change ropinirole and/or entacapone in the future because of memory change.  Today, patient reports that he feels better but wife notes more excessive movement.  Memory not good.   pt with few falls - wife thinks that its b/c he is feeling better and hes going outside and falling.  He has been to physical and Occupational  Therapy since our last visit.  Those notes are reviewed.  He is feeling like hands are frozen and frostbit and that feeling keeps him up.  Current prescribed movement disorder medications: carbidopa/levodopa 25/100, 2 tablets @ 7am / 9:30 am/ 12 pm /2:30 pm /5 pm and 7:30 pm (started last visit) Carbidopa/levodopa 50/200 CR at bedtime (started last visit) Entacapone, 200 mg, 1 tablet with first 4 dosages of levodopa Ropinirole, 3 mg 3 times per day    ALLERGIES:   Allergies  Allergen Reactions   Sulfa Antibiotics     unknown    CURRENT MEDICATIONS:  Outpatient Encounter Medications as of 03/18/2021  Medication Sig   aspirin 81 MG tablet Take 81 mg by mouth daily.   atorvastatin (LIPITOR) 20 MG tablet Take 1 tablet (20 mg total) by mouth daily.   carbidopa-levodopa (SINEMET CR) 50-200 MG tablet Take 1 tablet by mouth at bedtime.   carbidopa-levodopa (SINEMET IR) 25-100 MG tablet 2 tabs @ 7am/9:30am/12pm/2:30pm/5pm/7:30pm   entacapone (COMTAN) 200 MG tablet Take 1 tablet (200 mg total) by mouth 4 (four) times daily. 2 tablets with first 4 doses of Levodopa   esomeprazole (NEXIUM) 20 MG capsule Take 40 mg by mouth daily.   ferrous sulfate 325 (65 FE) MG tablet Take 325 mg by mouth daily with breakfast.   levothyroxine (SYNTHROID, LEVOTHROID) 125 MCG tablet Take 125 mcg by mouth daily.  metoprolol succinate (TOPROL-XL) 25 MG 24 hr tablet Take 1 tablet (25 mg total) by mouth daily.   nitroGLYCERIN (NITROSTAT) 0.4 MG SL tablet PLACE 1 TABLET UNDER TONGUE EVERY 5 MINUTES FOR 3 DOSES AS NEEDED FOR CHEST PAIN   polyethylene glycol (MIRALAX / GLYCOLAX) 17 g packet Take 17 g by mouth daily.   rOPINIRole (REQUIP) 3 MG tablet Take 1 tablet (3 mg total) by mouth in the morning, at noon, and at bedtime.   UNABLE TO FIND Med Name: Hemp oil   warfarin (COUMADIN) 5 MG tablet Take 1/2 to 1 tablet daily or as directed by Coumadin clinic.   No facility-administered encounter medications on file as of  03/18/2021.    Objective:   PHYSICAL EXAMINATION:    VITALS:   Vitals:   03/18/21 1032  BP: (!) 142/78  Pulse: 80  SpO2: 98%  Weight: 165 lb 8 oz (75.1 kg)  Height: 5\' 7"  (1.702 m)    GEN:  The patient appears stated age and is in NAD. HEENT:  Normocephalic, atraumatic.  The mucous membranes are moist. The superficial temporal arteries are without ropiness or tenderness. CV:  RRR Lungs:  CTAB Neck/HEME:  There are no carotid bruits bilaterally.  Neurological examination:  Orientation: The patient is alert and oriented x3. Cranial nerves: There is good facial symmetry with facial hypomimia. The speech is fluent and clear. Soft palate rises symmetrically and there is no tongue deviation. Hearing is decreased to conversational tone. Sensation: Sensation is intact to light touch throughout Motor: Strength is at least antigravity x4.  Movement examination: Tone: There is normal tone in the bilateral upper extremities.  The tone in the lower extremities is normal.  Abnormal movements: There is no tremor today.  He has mild to mod LE dyskinesia Coordination:  There is mild decremation with RAM's, especially with toe taps bilaterally, left greater than right. Gait and Station: The patient pushes off of the chair to arise.  He ambulates better than last visit.   Walks on toe on the L leg.    I have reviewed and interpreted the following labs independently    Chemistry      Component Value Date/Time   NA 140 08/31/2016 0803   K 4.1 08/31/2016 0803   CL 105 08/31/2016 0803   CO2 26 09/08/2013 1008   BUN 15 08/31/2016 0803   CREATININE 0.70 08/31/2016 0803      Component Value Date/Time   CALCIUM 9.5 09/08/2013 1008   ALKPHOS 70 07/22/2013 0350   AST 29 07/22/2013 0350   ALT 25 07/22/2013 0350   BILITOT 1.5 (H) 07/22/2013 0350       Lab Results  Component Value Date   WBC 4.1 (L) 09/08/2013   HGB 13.3 08/31/2016   HCT 39.0 08/31/2016   MCV 82.8 09/08/2013   PLT  181.0 09/08/2013    No results found for: TSH   Total time spent on today's visit was 30 minutes, including both face-to-face time and nonface-to-face time.  Time included that spent on review of records (prior notes available to me/labs/imaging if pertinent), discussing treatment and goals, answering patient's questions and coordinating care.  Cc:  [place], 09/10/2013, MD

## 2021-03-18 ENCOUNTER — Ambulatory Visit: Payer: PPO | Admitting: Neurology

## 2021-03-18 ENCOUNTER — Encounter: Payer: Self-pay | Admitting: Neurology

## 2021-03-18 ENCOUNTER — Other Ambulatory Visit: Payer: Self-pay

## 2021-03-18 VITALS — BP 142/78 | HR 80 | Ht 67.0 in | Wt 165.5 lb

## 2021-03-18 DIAGNOSIS — G20 Parkinson's disease: Secondary | ICD-10-CM

## 2021-03-18 DIAGNOSIS — R413 Other amnesia: Secondary | ICD-10-CM

## 2021-03-18 DIAGNOSIS — H903 Sensorineural hearing loss, bilateral: Secondary | ICD-10-CM | POA: Diagnosis not present

## 2021-03-18 DIAGNOSIS — G249 Dystonia, unspecified: Secondary | ICD-10-CM

## 2021-03-18 NOTE — Patient Instructions (Signed)
Week 1 :  decrease entacapone to 200 mg three times per day Week 2:  decrease entacapone to 200 mg twice per day Week 3:  decrease entacapone to 200 mg once per day STOP entacapone  Try compression gloves for the hands or over the counter lidocaine cream.

## 2021-03-19 ENCOUNTER — Ambulatory Visit: Payer: PPO | Attending: Neurology | Admitting: Occupational Therapy

## 2021-03-19 ENCOUNTER — Ambulatory Visit: Payer: PPO

## 2021-03-19 DIAGNOSIS — R278 Other lack of coordination: Secondary | ICD-10-CM

## 2021-03-19 DIAGNOSIS — R2681 Unsteadiness on feet: Secondary | ICD-10-CM | POA: Diagnosis not present

## 2021-03-19 DIAGNOSIS — R41842 Visuospatial deficit: Secondary | ICD-10-CM | POA: Diagnosis not present

## 2021-03-19 DIAGNOSIS — R2689 Other abnormalities of gait and mobility: Secondary | ICD-10-CM

## 2021-03-19 DIAGNOSIS — R251 Tremor, unspecified: Secondary | ICD-10-CM | POA: Diagnosis not present

## 2021-03-19 DIAGNOSIS — R1310 Dysphagia, unspecified: Secondary | ICD-10-CM | POA: Insufficient documentation

## 2021-03-19 DIAGNOSIS — R471 Dysarthria and anarthria: Secondary | ICD-10-CM | POA: Diagnosis not present

## 2021-03-19 DIAGNOSIS — Z9181 History of falling: Secondary | ICD-10-CM | POA: Diagnosis not present

## 2021-03-19 DIAGNOSIS — R1312 Dysphagia, oropharyngeal phase: Secondary | ICD-10-CM | POA: Diagnosis not present

## 2021-03-19 DIAGNOSIS — R29818 Other symptoms and signs involving the nervous system: Secondary | ICD-10-CM | POA: Diagnosis not present

## 2021-03-19 DIAGNOSIS — M6281 Muscle weakness (generalized): Secondary | ICD-10-CM | POA: Diagnosis not present

## 2021-03-19 DIAGNOSIS — R293 Abnormal posture: Secondary | ICD-10-CM

## 2021-03-19 NOTE — Therapy (Signed)
[facility] [address] [place], [place], [address] Phone: [phone]   Fax:  [phone]  Physical Therapy Treatment  Patient Details  Name: Donald [name] MRN: [id number] Date of Birth: [birth year] Referring Provider (PT): Dr. Carles Collet   Encounter Date: 03/19/2021   PT End of Session - 03/19/21 0938     Visit Number 9    Number of Visits 17    Date for PT Re-Evaluation 04/29/21    Authorization Type HTA - $30 co pay    PT Start Time 0932    PT Stop Time 1010    PT Time Calculation (min) 38 min    Equipment Utilized During Treatment Gait belt    Activity Tolerance Patient tolerated treatment well    Behavior During Therapy [facility] for tasks assessed/performed;Impulsive             Past Medical History:  Diagnosis Date   Abnormality of gait 08/27/2014   Anxiety    Aortic stenosis    s/p AVR by Dr Cyndia Bent   Arthritis    left wrist- probable- not diagonised   Asthma    in the past   CAD (coronary artery disease)    s/p CABG 2006   GERD (gastroesophageal reflux disease)    H/O seasonal allergies    H/O: rheumatic fever    Hearing deficit    Bilateral hearing aids   Hematuria    had CT scan   Hiatal hernia    History of kidney stones    HOH (hard of hearing)    Hyperlipidemia    Hypertension    Hyperthyroidism    Inguinal hernia    right side; watching at this time per wife's report   Memory difficulty 08/27/2014   Pacemaker    Parkinson's disease    Persistent atrial fibrillation ([place])    Scarlet fever    as a child   Shortness of breath    Sick sinus syndrome ([place])    s/p PPM (MDT) 2006    Past Surgical History:  Procedure Laterality Date   AORTIC VALVE REPLACEMENT  2006   CARDIOVERSION N/A 09/14/2013   Procedure: CARDIOVERSION;  Surgeon: Sanda Klein, MD;  Location: [facility];  Service: Cardiovascular;  Laterality: N/A;   CHOLECYSTECTOMY N/A 07/21/2013   Procedure: LAPAROSCOPIC  CHOLECYSTECTOMY;  Surgeon: Edward Jolly, MD;  Location: [facility];  Service: General;  Laterality: N/A;   COLONOSCOPY W/ POLYPECTOMY     COLONOSCOPY WITH PROPOFOL N/A 08/31/2016   Procedure: COLONOSCOPY WITH PROPOFOL;  Surgeon: Wilford Corner, MD;  Location: [facility] END[facility]COPY;  Service: Endoscopy;  Laterality: N/A;   CORONARY ARTERY BYPASS GRAFT  2006   DENTAL SURGERY     implants   INSERT / REPLACE / REMOVE PACEMAKER  2006, 2012   MDT for sick sinus syndrome, gen change 8/12 by [place]    There were no vitals filed for this visit.   Subjective Assessment - 03/19/21 0936     Subjective Pt saw Dr. Carles Collet yesterday and they are weaning him off entacapone. She will then try to take off ropinirol next time as fears they are affecting memory. Pt had 2 falls outside when tinkering with mower. No injuries and reports he was able to get up by himself.    Pertinent History PMH: PD, CAD (s/p CABG 2006), LD, HTN, a fib, pacemaker, GERD, on anticoagulants    Limitations Walking;House hold activities;Standing    Patient  Stated Goals wants to improve his balance    Currently in Pain? No/denies                               [facility] Adult PT Treatment/Exercise - 03/19/21 0939       Ambulation/Gait   Ambulation/Gait Yes    Ambulation/Gait Assistance 4: Min guard    Ambulation/Gait Assistance Details Verbal cues to step up to the red band on walker and use BIG steps    Ambulation Distance (Feet) 230 Feet    Assistive device Rolling walker    Gait Pattern Step-through pattern;Decreased step length - right;Decreased step length - left    Ambulation Surface Level;Indoor    Gait Comments Working on turning with RW around cones with wide turns x 5 min. Pt was cued to pick up feet with marching with turns. Also advised that if he starts shuffling to pause and restart with weight shift to help start again. CGA throughout.      Neuro Re-ed    Neuro Re-ed Details   Stepping to dots on floor in forward diagonal pattern 5 x 2 each side without UE support with PT providing tactile cues for upright posture and cues to take big step on and off dots CGA/min assist occasionally. Pt most challenged on left with stepping back but did improve some with cuing. Stepping out to side and back over 2" foam bolster 5 x 2 each side CGA. Reaching for targets with walker in front picking up bean bags on stool to front and left side x 5 each then switched to no UE support CGA x 5 forward and to side then performed with reaching across body with RUE to reach to left x 5, then performed with taking large step first then grabbing bean bag and stepping back. This was much more challenging needing min assist at times and max cuing to perform correctly. In tall kneeling on mat on floor performed reaching for bean bags in front and to each side x 5 with coming upright between each time. In tall kneeling stepping out to side and back x 5. Performed reaching and tall kneeling tasks to mimic working on mowers at home. Advised to be sure to have UE support when performing reaching tasks and to perform from chair level if possible.                       PT Short Term Goals - 03/11/21 1117       PT SHORT TERM GOAL #1   Title Pt will perform initial HEP with supervision from pt's spouse for strength, balance, and transfers in order to build upon functional gains made in therapy. ALL STGS DUE 02/26/21    Time 4    Period Weeks    Status New    Target Date 02/26/21      PT SHORT TERM GOAL #2   Title Pt and pt's spouse will verbalize understanding of fall prevention in the home.    Baseline provided handout on 03/11/21    Time 4    Period Weeks    Status Achieved      PT SHORT TERM GOAL #3   Title Pt and pt's spouse will verbalize understanding of techniques to help with freezing/festination episodes in order to demo improved safety with gait.    Baseline Reviewed today in  session on 02/25/21 - will continue to benefit  from additional practice/repetition with wife practicing cues with pt    Time 4    Period Weeks    Status Partially Met      PT SHORT TERM GOAL #4   Title Pt will perform TUG with RW vs. UStep Walker in 21 seconds or less in order to demo decr fall risk.    Baseline 23.75 seconds, 51 seconds first attempt 42 seconds 2nd attempt    Time 4    Period Weeks    Status Not Met      PT SHORT TERM GOAL #5   Title Pt will perform 5 reps of sit <> stand with BUE support with proper technique and without BLE bracing in order to demo improved safety with functional transfers.    Baseline met on 02/25/21    Time 4    Period Weeks    Status Achieved               PT Long Term Goals - 01/29/21 0947       PT LONG TERM GOAL #1   Title Pt will perform final HEP with supervision from pt's spouse for strength, balance, and transfers in order to build upon functional gains made in therapy. ALL LTGS DUE 03/26/21    Time 8    Period Weeks    Status New    Target Date 03/26/21      PT LONG TERM GOAL #2   Title Pt and pt's spouse will verbalize understanding of local PD resources.    Time 8    Period Weeks    Status New      PT LONG TERM GOAL #3   Title Pt will improve gait speed with RW vs. Ustep walker to at least 2.7 ft/sec for improved community mobility.    Baseline 2.49 ft/sec    Time 8    Period Weeks    Status New      PT LONG TERM GOAL #4   Title Pt will perform 5x sit <> stand in 18 seconds or less with UE support in order to demo improved functional transfers and decr fall risk.    Baseline 22.22 seconds, decr eccentric control    Time 8    Period Weeks    Status New      PT LONG TERM GOAL #5   Title Pt will perform TUG with RW vs. UStep Walker in 18.5 seconds or less in order to demo decr fall risk.    Baseline 23.75 seconds with RW    Time 8    Period Weeks    Status New      Additional Long Term Goals   Additional Long  Term Goals Yes      PT LONG TERM GOAL #6   Title Pt will ambulate at least 230' with RW vs. UStep walker with supervision in order to demo improved household mobility.    Baseline needs min guard/min A with RW    Time 8    Period Weeks    Status New                   Plan - 03/19/21 1034     Clinical Impression Statement PT continued to work on marching/large steps to help with gait and turning. Also worked on trying to return to upright posture with reaching activities as pt seems to be having most of falls when tinkering with mowers. Wife reports he insists on doing it and keeps  a phone with him. They have been instructed that this is not safe activitiy to be alone for. Falls seem to be coming more from tall kneeling position so worked some in this position today. PT advised to keep UE support with one hand when doing this. Pt also noted to have more difficulty with steps when gets distracted.    Personal Factors and Comorbidities Comorbidity 3+;Time since onset of injury/illness/exacerbation;Past/Current Experience;Behavior Pattern    Comorbidities PD (diagnosed 2012), CAD (s/p CABG 2006), LD, HTN, a fib, pacemaker, GERD, on anticoagulants    Examination-Activity Limitations Bed Mobility;Caring for Others;Toileting;Transfers;Stairs;Locomotion Level;Stand;Squat;Reach Overhead    Designer, fashion/clothing;Yard Work    [employer]chant [employer] officer Evolving/Moderate complexity    Rehab Potential Good    PT Frequency 2x / week    PT Duration 12 weeks    PT Treatment/Interventions ADLs/Self Care Home Management;DME Instruction;Therapeutic activities;Functional mobility training;Gait training;Stair training;Therapeutic exercise;Balance training;Neuromuscular re-education;Patient/family education;Energy conservation;Vestibular    PT Next Visit Plan LTGs need check/updating next week.10th visit progress note next visit. Pt is  very hard of hearing even with hearing aids (need to wear clear mask).   Continue gait and turning training with RW - working on cues for incr stride length and working on slowed pace, using red tband as big kicks/steps to the red. Pt seemed most challenged when gets distracted. Continue to use wide BOS lateral weightshifting to initiatie gait. standing foot clearance/stepping strategy activities and weight shifting. See if pt can get started on a walking program for home down hallway with wife cueing working on incr step length.    Consulted and Agree with Plan of Care Patient;Family member/caregiver    Family Member Consulted pt's [name]             Patient will benefit from skilled therapeutic intervention in order to improve the following deficits and impairments:  Abnormal gait, Decreased activity tolerance, Decreased coordination, Decreased balance, Decreased endurance, Decreased knowledge of use of DME, Decreased safety awareness, Decreased mobility, Decreased strength, Difficulty walking, Impaired flexibility, Postural dysfunction  Visit Diagnosis: Other abnormalities of gait and mobility  Muscle weakness (generalized)  Unsteadiness on feet     Problem List Patient Active Problem List   Diagnosis Date Noted   HOH (hard of hearing) 12/18/2020   Personal history of colonic polyps 08/31/2016   Abnormality of gait 08/27/2014   Memory difficulty 08/27/2014   Atrial fibrillation ([place]) 09/08/2013   Cholelithiases 07/19/2013   Cholelithiasis 07/19/2013   Encounter for therapeutic drug monitoring 07/03/2013   Aortic valve replaced 04/05/2013   Parkinson disease ([place]) 09/27/2012   Orthostatic hypotension 04/01/2012   Pacemaker-Medtronic 03/29/2012   HEPATITIS A 06/04/2010   HYPERTHYROIDISM 06/04/2010   HYPERLIPIDEMIA 06/04/2010   Essential hypertension 06/04/2010   CAD 06/04/2010   AORTIC STENOSIS 06/04/2010   SICK SINUS SYNDROME 06/04/2010   HIATAL HERNIA 06/04/2010    SHORTNESS OF BREATH 06/04/2010   CHEST PAIN 06/04/2010    Electa Sniff, PT, DPT, NCS 03/19/2021, 10:38 AM  [facility] [address] [place], [place], [address] Phone: [phone]   Fax:  [phone]  Name: Donald [name] MRN: [id number] Date of Birth: 01-06-1941

## 2021-03-19 NOTE — Therapy (Signed)
[facility] [address] [place], [place], [address] Phone: [phone]   Fax:  [phone]  Occupational Therapy Treatment  Patient Details  Name: Donald [name] MRN: [id number] Date of Birth: 1940-12-06 Referring Provider (OT): Dr. Wells Guiles Tat   Encounter Date: 03/19/2021   OT End of Session - 03/19/21 0945     Visit Number 4    Number of Visits 17    Date for OT Re-Evaluation 04/26/21    Authorization Type [employer], follow Medicare guidelines, copay per day    Authorization - Visit Number 4    Progress Note Due on Visit 10    OT Start Time 0850    OT Stop Time 0930    OT Time Calculation (min) 40 min    Activity Tolerance Patient tolerated treatment well    Behavior During Therapy [facility] for tasks assessed/performed;Impulsive             Past Medical History:  Diagnosis Date   Abnormality of gait 08/27/2014   Anxiety    Aortic stenosis    s/p AVR by Dr Cyndia Bent   Arthritis    left wrist- probable- not diagonised   Asthma    in the past   CAD (coronary artery disease)    s/p CABG 2006   GERD (gastroesophageal reflux disease)    H/O seasonal allergies    H/O: rheumatic fever    Hearing deficit    Bilateral hearing aids   Hematuria    had CT scan   Hiatal hernia    History of kidney stones    HOH (hard of hearing)    Hyperlipidemia    Hypertension    Hyperthyroidism    Inguinal hernia    right side; watching at this time per wife's report   Memory difficulty 08/27/2014   Pacemaker    Parkinson's disease    Persistent atrial fibrillation ([place])    Scarlet fever    as a child   Shortness of breath    Sick sinus syndrome ([place])    s/p PPM (MDT) 2006    Past Surgical History:  Procedure Laterality Date   AORTIC VALVE REPLACEMENT  2006   CARDIOVERSION N/A 09/14/2013   Procedure: CARDIOVERSION;  Surgeon: Sanda Klein, MD;  Location: [facility] ENDOSCOPY;  Service: Cardiovascular;  Laterality:  N/A;   CHOLECYSTECTOMY N/A 07/21/2013   Procedure: LAPAROSCOPIC CHOLECYSTECTOMY;  Surgeon: Edward Jolly, MD;  Location: [facility];  Service: General;  Laterality: N/A;   COLONOSCOPY W/ POLYPECTOMY     COLONOSCOPY WITH PROPOFOL N/A 08/31/2016   Procedure: COLONOSCOPY WITH PROPOFOL;  Surgeon: Wilford Corner, MD;  Location: [facility] ENDOSCOPY;  Service: Endoscopy;  Laterality: N/A;   CORONARY ARTERY BYPASS GRAFT  2006   DENTAL SURGERY     implants   INSERT / REPLACE / REMOVE PACEMAKER  2006, 2012   MDT for sick sinus syndrome, gen change 8/12 by [place]    There were no vitals filed for this visit.   Subjective Assessment - 03/19/21 0952     Subjective  Denies pain    Pertinent History Parkinson's Disease.   PMH:  hx of falls (wears helment), arthritis, asthma, CAD, GERD, HOH, HLD, HTN, hyperthyroidism, anxiety, aortic stenosis, hx of aortic valve replacement 2006, memory difficulty, pacemaker, persistent a-fib, hx of scarlet fever, shortness of breath, hx of CABG 2006, cholecystectomy 2015    Limitations fall risk (wears helment and pads), freezing, hard of  hearing--reads lips (wear clear mask)    Patient Stated Goals improve coordination    Currently in Pain? No/denies                   Treatment:  PWR1!hands, with demonstration/ v.cFastening and unfastening buttons with therapist providing assist to hold shirt while pt brought button to hole, with RUE, mod-max cueing and facilitation, Unfastening buttons with LUE with therapist stabilizing shirt, max difficulty/ v.c Tying shoe with min -mod A on tabletop, pt was able to tie on foot with min v.c  Placing large pegs into pegboard with right and left UE's mod v.c for postioning Removing with bilaterally UE simultaneously  mod v.c for amplitude and finger extension Discussion with pt and wife about importance of donning pants over legs in seated, pt verbalized understanding. Pt drank water when taking  pills, coughing noted. Discussion with pt/ wife about taking small sips, and not taking another sip while coughing, mod v.c Therapist also recommended pt rests his spoon between bites at home as pt's wife reports pt eats really fast at times. Pt see ST next week.                 OT Short Term Goals - 02/25/21 2123       OT SHORT TERM GOAL #1   Title Pt will perform PD-specific HEP with cueing prn from caregiver--check STGs 03/27/21    Time 4    Period Weeks    Status New      OT SHORT TERM GOAL #2   Title Pt will improve R hand coordination for ADLs (buttoning/tying shoes) as shown by completing 9-hole peg test in 75sec or less.    Baseline 88.31sec    Time 4    Period Weeks    Status New      OT SHORT TERM GOAL #3   Title Pt will improve L hand coordination for ADLs (buttoning/tying shoes) as shown by completing 9-hole peg test in 90 sec or less.    Baseline 108.94sec    Time 4    Period Weeks    Status New      OT SHORT TERM GOAL #4   Title Pt/caregiver will verbalize understanding of ways to decr risk of future complcations related to PD (including falls).    Time 4    Period Weeks    Status New               OT Long Term Goals - 02/25/21 2128       OT LONG TERM GOAL #1   Title Pt/caregiver will verbalize understanding of AE/strategies to incr ease, safety, and independence with ADLs/IADLs.--check LTGs 04/26/21    Time 8    Period Weeks    Status New      OT LONG TERM GOAL #2   Title Pt will improve R hand coordination for ADLs (buttoning/tying shoes) as shown by completing 9-hole peg test in 65sec or less.    Time 8    Period Weeks    Status New      OT LONG TERM GOAL #3   Title Pt will improve L hand coordination for ADLs (buttoning/tying shoes) as shown by completing 9-hole peg test in 75 sec or less.    Time 8    Period Weeks    Status New      OT LONG TERM GOAL #4   Title Pt will improve functional reaching/coordination for ADLs as  shown by improving  score on box and blocks test by at least 5 with dominant LUE.    Baseline 32 blocks    Time 8    Period Weeks    Status New      OT LONG TERM GOAL #5   Title Pt/caregiver will verbalize understanding of appropriate community resources.    Time 8    Period Weeks    Status New                   Plan - 03/19/21 0950     Clinical Impression Statement Pt is slowly progressing towards goals due to severity of deficits. Pt also demo some dyskinesias and possible dystonia that may be contributing to functional difficulty.    OT Occupational Profile and History Detailed Assessment- Review of Records and additional review of physical, cognitive, psychosocial history related to current functional performance    Occupational performance deficits (Please refer to evaluation for details): ADL's;IADL's;Leisure    Body Structure / Function / Physical Skills Balance;ADL;Decreased knowledge of use of DME;UE functional use;IADL;Improper spinal/pelvic alignment;Vision;Mobility;Coordination;Decreased knowledge of precautions;FMC    Rehab Potential Good    Clinical Decision Making Several treatment options, min-mod task modification necessary    Comorbidities Affecting Occupational Performance: May have comorbidities impacting occupational performance    Modification or Assistance to Complete Evaluation  Min-Moderate modification of tasks or assist with assess necessary to complete eval    OT Frequency 2x / week    OT Duration 8 weeks   +eval   OT Treatment/Interventions Self-care/ADL training;Moist Heat;DME and/or AE instruction;Therapeutic activities;Therapeutic exercise;Cognitive remediation/compensation;Visual/perceptual remediation/compensation;Passive range of motion;Functional Mobility Training;Neuromuscular education;Cryotherapy;Energy conservation;Manual Therapy;Patient/family education    Plan continue with ADL stretageies, progess HEP as able    Consulted and Agree with  Plan of Care Patient;Family member/caregiver    Family Member Consulted wife             Patient will benefit from skilled therapeutic intervention in order to improve the following deficits and impairments:   Body Structure / Function / Physical Skills: Balance, ADL, Decreased knowledge of use of DME, UE functional use, IADL, Improper spinal/pelvic alignment, Vision, Mobility, Coordination, Decreased knowledge of precautions, FMC       Visit Diagnosis: Other lack of coordination  Visuospatial deficit  Tremor  Other abnormalities of gait and mobility  Unsteadiness on feet  Abnormal posture  Other symptoms and signs involving the nervous system    Problem List Patient Active Problem List   Diagnosis Date Noted   HOH (hard of hearing) 12/18/2020   Personal history of colonic polyps 08/31/2016   Abnormality of gait 08/27/2014   Memory difficulty 08/27/2014   Atrial fibrillation ([facility]) 09/08/2013   Cholelithiases 07/19/2013   Cholelithiasis 07/19/2013   Encounter for therapeutic drug monitoring 07/03/2013   Aortic valve replaced 04/05/2013   Parkinson disease ([facility]) 09/27/2012   Orthostatic hypotension 04/01/2012   Pacemaker-Medtronic 03/29/2012   HEPATITIS A 06/04/2010   HYPERTHYROIDISM 06/04/2010   HYPERLIPIDEMIA 06/04/2010   Essential hypertension 06/04/2010   CAD 06/04/2010   AORTIC STENOSIS 06/04/2010   SICK SINUS SYNDROME 06/04/2010   HIATAL HERNIA 06/04/2010   SHORTNESS OF BREATH 06/04/2010   CHEST PAIN 06/04/2010    Jenika Chiem, OT/L 03/19/2021, 9:52 AM  [facility] [address] [place], [place], [address] Phone: [phone]   Fax:  [phone]  Name: Donald [name] MRN: [id number] Date of Birth: 03/15/1941

## 2021-03-20 ENCOUNTER — Ambulatory Visit: Payer: PPO | Admitting: Physical Therapy

## 2021-03-21 ENCOUNTER — Ambulatory Visit (INDEPENDENT_AMBULATORY_CARE_PROVIDER_SITE_OTHER): Payer: PPO

## 2021-03-21 DIAGNOSIS — I495 Sick sinus syndrome: Secondary | ICD-10-CM

## 2021-03-21 LAB — CUP PACEART REMOTE DEVICE CHECK
Battery Impedance: 1715 Ohm
Battery Remaining Longevity: 46 mo
Battery Voltage: 2.75 V
Brady Statistic RV Percent Paced: 55 %
Date Time Interrogation Session: 20221104090636
Implantable Lead Implant Date: 20060326
Implantable Lead Implant Date: 20060326
Implantable Lead Location: 753859
Implantable Lead Location: 753860
Implantable Lead Model: 5076
Implantable Lead Model: 5076
Implantable Pulse Generator Implant Date: 20120817
Lead Channel Impedance Value: 422 Ohm
Lead Channel Impedance Value: 67 Ohm
Lead Channel Pacing Threshold Amplitude: 0.625 V
Lead Channel Pacing Threshold Pulse Width: 0.4 ms
Lead Channel Setting Pacing Amplitude: 2.5 V
Lead Channel Setting Pacing Pulse Width: 0.4 ms
Lead Channel Setting Sensing Sensitivity: 5.6 mV

## 2021-03-24 ENCOUNTER — Ambulatory Visit: Payer: PPO | Admitting: Physical Therapy

## 2021-03-24 ENCOUNTER — Encounter: Payer: Self-pay | Admitting: Physical Therapy

## 2021-03-24 ENCOUNTER — Ambulatory Visit: Payer: PPO | Admitting: Speech Pathology

## 2021-03-24 ENCOUNTER — Encounter: Payer: Self-pay | Admitting: Occupational Therapy

## 2021-03-24 ENCOUNTER — Ambulatory Visit: Payer: PPO | Admitting: Occupational Therapy

## 2021-03-24 ENCOUNTER — Other Ambulatory Visit: Payer: Self-pay

## 2021-03-24 DIAGNOSIS — R2689 Other abnormalities of gait and mobility: Secondary | ICD-10-CM

## 2021-03-24 DIAGNOSIS — R41842 Visuospatial deficit: Secondary | ICD-10-CM | POA: Diagnosis not present

## 2021-03-24 DIAGNOSIS — R251 Tremor, unspecified: Secondary | ICD-10-CM | POA: Diagnosis not present

## 2021-03-24 DIAGNOSIS — M6281 Muscle weakness (generalized): Secondary | ICD-10-CM | POA: Diagnosis not present

## 2021-03-24 DIAGNOSIS — R29818 Other symptoms and signs involving the nervous system: Secondary | ICD-10-CM | POA: Diagnosis not present

## 2021-03-24 DIAGNOSIS — R471 Dysarthria and anarthria: Secondary | ICD-10-CM | POA: Diagnosis not present

## 2021-03-24 DIAGNOSIS — R278 Other lack of coordination: Secondary | ICD-10-CM

## 2021-03-24 DIAGNOSIS — R1312 Dysphagia, oropharyngeal phase: Secondary | ICD-10-CM | POA: Diagnosis not present

## 2021-03-24 DIAGNOSIS — R2681 Unsteadiness on feet: Secondary | ICD-10-CM

## 2021-03-24 DIAGNOSIS — R293 Abnormal posture: Secondary | ICD-10-CM

## 2021-03-24 DIAGNOSIS — Z9181 History of falling: Secondary | ICD-10-CM | POA: Diagnosis not present

## 2021-03-24 DIAGNOSIS — R1310 Dysphagia, unspecified: Secondary | ICD-10-CM | POA: Diagnosis not present

## 2021-03-24 NOTE — Therapy (Signed)
[facility] [address] [place], [place], [address] Phone: [phone]   Fax:  [phone]  Speech Language Pathology Treatment  Patient Details  Name: Donald [name] MRN: [id number] Date of Birth: 24-Apr-1941 Referring Provider (SLP): Dr. Lurena Joinerebecca [name]   Encounter Date: 03/24/2021   End of Session - 03/24/21 1144     Visit Number 2    Number of Visits 25    Date for SLP Re-Evaluation 05/23/21    SLP Start Time 1107    SLP Stop Time  1140    SLP Time Calculation (min) 33 min    Activity Tolerance Patient tolerated treatment well             Past Medical History:  Diagnosis Date   Abnormality of gait 08/27/2014   Anxiety    Aortic stenosis    s/p AVR by Dr Laneta SimmersBartle   Arthritis    left wrist- probable- not diagonised   Asthma    in the past   CAD (coronary artery disease)    s/p CABG 2006   GERD (gastroesophageal reflux disease)    H/O seasonal allergies    H/O: rheumatic fever    Hearing deficit    Bilateral hearing aids   Hematuria    had CT scan   Hiatal hernia    History of kidney stones    HOH (hard of hearing)    Hyperlipidemia    Hypertension    Hyperthyroidism    Inguinal hernia    right side; watching at this time per wife's report   Memory difficulty 08/27/2014   Pacemaker    Parkinson's disease    Persistent atrial fibrillation (HCC)    Scarlet fever    as a child   Shortness of breath    Sick sinus syndrome (HCC)    s/p PPM (MDT) 2006    Past Surgical History:  Procedure Laterality Date   AORTIC VALVE REPLACEMENT  2006   CARDIOVERSION N/A 09/14/2013   Procedure: CARDIOVERSION;  Surgeon: Donald FairMihai Croitoru, MD;  Location: [facility] ENDOSCOPY;  Service: Cardiovascular;  Laterality: N/A;   CHOLECYSTECTOMY N/A 07/21/2013   Procedure: LAPAROSCOPIC CHOLECYSTECTOMY;  Surgeon: Donald SaaBenjamin T Hoxworth, MD;  Location: [facility] OR;  Service: General;  Laterality: N/A;   COLONOSCOPY W/ POLYPECTOMY     COLONOSCOPY  WITH PROPOFOL N/A 08/31/2016   Procedure: COLONOSCOPY WITH PROPOFOL;  Surgeon: Donald RakesVincent Schooler, MD;  [facility];  Service: Endoscopy;  Laterality: N/A;   CORONARY ARTERY BYPASS GRAFT  2006   DENTAL SURGERY     implants   INSERT / REPLACE / REMOVE PACEMAKER  2006, 2012   MDT for sick sinus syndrome, gen change 8/12 by Donald [name]   KIDNEY STONE SURGERY  1984    There were no vitals filed for this visit.          ADULT SLP TREATMENT - 03/24/21 1057       General Information   Behavior/Cognition Alert;Cooperative;Requires cueing;Hard of hearing      Treatment Provided   Treatment provided Dysphagia      Dysphagia Treatment   Temperature Spikes Noted No    Respiratory Status Room air    Treatment Methods Skilled observation;Therapeutic exercise;Compensation strategy training;Patient/caregiver education    Patient observed directly with PO's Yes    Type of PO's observed Thin liquids    Feeding Able to feed self    Liquids provided via Cup    Oral Phase Signs & Symptoms Anterior loss/spillage  mild, then drool   Pharyngeal Phase Signs & Symptoms Delayed throat clear    Type of cueing Verbal;Visual;Tactile    Amount of cueing Maximal    Other treatment/comments Spoue verbalized information from MBSS "She said if he gets sick he may get pneumonia from his swallowing" [name] has had difficulty reducing rate of bolus intake of solids. Continue to educate spouse to cue fork down in between bites and to follow solid with water sip and hard swallow. Initiated training of simple HEP for dysphagia, including effortful swallow, lingual stretch, masako, and CTAR as well as tongue base retraction exercises. Spouse educated re: cueing for exercises however ongoing training required. With max A, clear mask written as well as verbal instructions and consistent modeling, Valerio demonstrated 6/10 efforful swallows. He required max A to place towel for CTAR and tactile cues to hold towel for 10  seconds 5/10 reps. Pitch elevation not achieved with max verbal, written and modeling cues likely affected by severe hearing loss. Same with larryngeal breath hold. Lingiual stretch clock wise and counter clockwise with occasional min A for 10 reps each.      Pain Assessment   Pain Assessment No/denies pain      Assessment / Recommendations / Plan   Plan Continue with current plan of care      Progression Toward Goals   Progression toward goals Progressing toward goals              SLP Education - 03/24/21 1138     Education Details swallow precautions, HEP for dysphagia    Person(s) Educated Patient;Spouse    Methods Explanation;Demonstration;Verbal cues;Handout;Tactile cues    Comprehension Verbalized understanding;Verbal cues required;Need further instruction              SLP Short Term Goals - 03/24/21 1143       SLP SHORT TERM GOAL #1   Title Pt will average 85dB on loud /a/ with occasional min A over 2 sessions.    Time 6    Period Weeks    Status On-going      SLP SHORT TERM GOAL #2   Title Pt will average 68dB on 18/20 sentences with occasional min A    Time 6    Period Weeks    Status On-going      SLP SHORT TERM GOAL #3   Title Pt/spouse will follow 2 diet modificiations with occasional min A over 3 sessions    Time 6    Period Weeks    Status On-going      SLP SHORT TERM GOAL #4   Title Pt will complete HEP for dysphagia with usual min  visual and verbal cues over 2 sessions    Time 6    Period Weeks    Status On-going      SLP SHORT TERM GOAL #5   Title Pt will average 68dB over 5 minute conversation with occasional min A over 2 sessions    Time 6    Period Weeks    Status On-going              SLP Long Term Goals - 03/24/21 1143       SLP LONG TERM GOAL #1   Title Pt will average 85dB on loud /a/ with rare min A over 2 sessions    Time 12    Period Weeks    Status On-going      SLP LONG TERM GOAL #2   Title Pt will  average  68dB over 12 minute conversation with occasional min A    Time 12    Period Weeks    Status On-going      SLP LONG TERM GOAL #3   Title Pt will complete HEP for dysarthria and dysphagia with occasional min A over 3 sessions    Time 12    Period Weeks    Status On-going      SLP LONG TERM GOAL #4   Title Pt will follow swallow precautions with occasional min A over 3 sessions    Time 12    Period Weeks    Status On-going      SLP LONG TERM GOAL #5   Title Pt/spouse will verbalize 4 s/s of aspiration pna with rare min A    Time 12    Period Weeks    Status On-going              Plan - 03/24/21 1139     Clinical Impression Statement Pt returns today after MBSS 03/12/21 revealed progression of severe oropharyngeal dysphagia with severe pharyngeal residue, greatest with puree and soft solids and silent aspiration across consistentcies. Thin liquids recommended with slow rate. Pt and spouse elect to continue regular solids, therefore recommend alternate solids with a liquid sip. Initiated training in swallow precautions and HEP for dysphagia. [name] reqiures consistent max A for both. Spouse verbalizes risk of aspriation pna should Bussiere become ill or bed ridden due to illness. Continue skilled ST to maximize safety of swallow and intelliglbity for safety and to reduce caregiver burden.    Speech Therapy Frequency 2x / week    Duration 12 weeks    Treatment/Interventions Aspiration precaution training;Language facilitation;Environmental controls;Cueing hierarchy;SLP instruction and feedback;Pharyngeal strengthening exercises;Compensatory techniques;Cognitive reorganization;Functional tasks;Compensatory strategies;Patient/family education;Multimodal communcation approach;Internal/external aids;Trials of upgraded texture/liquids;Diet toleration management by SLP;Other (comment)    Potential to Achieve Goals [name]    Potential Considerations Cooperation/participation level;Previous level  of function;Medical prognosis;Severity of impairments             Patient will benefit from skilled therapeutic intervention in order to improve the following deficits and impairments:   Dysphagia, unspecified type  Dysarthria and anarthria    Problem List Patient Active Problem List   Diagnosis Date Noted   HOH (hard of hearing) 12/18/2020   Personal history of colonic polyps 08/31/2016   Abnormality of gait 08/27/2014   Memory difficulty 08/27/2014   Atrial fibrillation (HCC) 09/08/2013   Cholelithiases 07/19/2013   Cholelithiasis 07/19/2013   Encounter for therapeutic drug monitoring 07/03/2013   Aortic valve replaced 04/05/2013   Parkinson disease (HCC) 09/27/2012   Orthostatic hypotension 04/01/2012   Pacemaker-Medtronic 03/29/2012   HEPATITIS A 06/04/2010   HYPERTHYROIDISM 06/04/2010   HYPERLIPIDEMIA 06/04/2010   Essential hypertension 06/04/2010   CAD 06/04/2010   AORTIC STENOSIS 06/04/2010   SICK SINUS SYNDROME 06/04/2010   HIATAL HERNIA 06/04/2010   SHORTNESS OF BREATH 06/04/2010   CHEST PAIN 06/04/2010    Luan Urbani, Radene Journey MS, CCC-SLP 03/24/2021, 11:45 AM  [facility] [address] [place], [place], [address] Phone: [phone]   Fax:  [phone]   Name: Donald [name] MRN: [id number] Date of Birth: [birth year]

## 2021-03-24 NOTE — Patient Instructions (Signed)
  Fork down in between bites Alternate solids and liquids Hard swallow with each bite/sip Go Slow!! When you are coughing or [place], don't talk or put more in  your mouth until you get it up     SWALLOWING EXERCISES Effortful Swallows - Squeeze hard with the muscles in your neck while you swallow your  saliva or a sip of water - Repeat up to 10 times, 2 times a day, and use whenever you eat or drink  Masako Swallow - swallow with your tongue sticking out - Stick tongue out and gently bite tongue with your teeth - Swallow, while holding your tongue with your teeth - Repeat up to 10 times, 2 times a day  Pitch Raise - Repeat "he", once per second in as high of a pitch as you can - Repeat up to 10 times, 2 times a day  Shaker Exercise - head lift - Lie flat on your back in your bed or on a couch without pillows - Raise your head and look at your feet  - KEEP YOUR SHOULDERS DOWN - HOLD FOR 45 SECONDS, then lower your head back down - Repeat 2-3 times, 2 times a day  [employer] - "half swallow" exercise - Start to swallow, and keep your Adam's apple up by squeezing hard with the muscles of the throat - Hold the squeeze for 3-5 seconds and then relax - Repeat up to 10 times, 2 times a day  Tongue Press - Press your entire tongue as hard as you can against the roof of your mouth for 3-5 seconds - Repeat 10 times, 2 times a day  Tongue Stretch/Teeth Clean - Move your tongue around the pocket between your gums and teeth, clockwise and then counter-clockwise - Repeat on the back side, clockwise and then counter-clockwise - Repeat up to 10 times, 2 times a day  Breath Hold - Say "HUH!" loudly, holding your breath tightly at the level of your voice box for 3 seconds - Repeat up to 10 times, 2 times a day  Chin pushback - Open your mouth  - Place your fist UNDER your chin near your neck, and push back with your fist for 3-5 seconds - Repeat up to 10 times, 2 times a  day    10.    Go- Go-Go - emphasizing the "G" sound   11. Stick out your tongue and say "GA-GA-GA" loud and shart           -Repeat 25 sets of 3, 2-3 times a day  12. Say ING-GA loud and exaggerated             - 25 times 2-3 times a day  13. Gargle 3-5x a day water

## 2021-03-24 NOTE — Therapy (Signed)
[facility] [address] [place], [place], [address] Phone: [phone]   Fax:  [phone]  Occupational Therapy Treatment  Patient Details  Name: Donald [name] MRN: [id number] Date of Birth: Aug 11, 1940 Referring Provider (OT): Dr. Wells Guiles Tat   Encounter Date: 03/24/2021   OT End of Session - 03/24/21 0846     Visit Number 5    Number of Visits 17    Date for OT Re-Evaluation 04/26/21    Authorization Type [employer], follow Medicare guidelines, copay per day    Authorization - Visit Number 5    Authorization - Number of Visits 10    Progress Note Due on Visit 10    OT Start Time 0848    OT Stop Time 0930    OT Time Calculation (min) 42 min    Activity Tolerance Patient tolerated treatment well    Behavior During Therapy [facility] for tasks assessed/performed;Impulsive             Past Medical History:  Diagnosis Date   Abnormality of gait 08/27/2014   Anxiety    Aortic stenosis    s/p AVR by Dr Cyndia Bent   Arthritis    left wrist- probable- not diagonised   Asthma    in the past   CAD (coronary artery disease)    s/p CABG 2006   GERD (gastroesophageal reflux disease)    H/O seasonal allergies    H/O: rheumatic fever    Hearing deficit    Bilateral hearing aids   Hematuria    had CT scan   Hiatal hernia    History of kidney stones    HOH (hard of hearing)    Hyperlipidemia    Hypertension    Hyperthyroidism    Inguinal hernia    right side; watching at this time per wife's report   Memory difficulty 08/27/2014   Pacemaker    Parkinson's disease    Persistent atrial fibrillation ([place])    Scarlet fever    as a child   Shortness of breath    Sick sinus syndrome ([place])    s/p PPM (MDT) 2006    Past Surgical History:  Procedure Laterality Date   AORTIC VALVE REPLACEMENT  2006   CARDIOVERSION N/A 09/14/2013   Procedure: CARDIOVERSION;  Surgeon: Sanda Klein, MD;  Location: [facility]  ENDOSCOPY;  Service: Cardiovascular;  Laterality: N/A;   CHOLECYSTECTOMY N/A 07/21/2013   Procedure: LAPAROSCOPIC CHOLECYSTECTOMY;  Surgeon: Edward Jolly, MD;  Location: [place];  Service: General;  Laterality: N/A;   COLONOSCOPY W/ POLYPECTOMY     COLONOSCOPY WITH PROPOFOL N/A 08/31/2016   Procedure: COLONOSCOPY WITH PROPOFOL;  Surgeon: Wilford Corner, MD;  Location: [facility] ENDOSCOPY;  Service: Endoscopy;  Laterality: N/A;   CORONARY ARTERY BYPASS GRAFT  2006   DENTAL SURGERY     implants   INSERT / REPLACE / REMOVE PACEMAKER  2006, 2012   MDT for sick sinus syndrome, gen change 8/12 by [place]    There were no vitals filed for this visit.   Subjective Assessment - 03/24/21 0845     Subjective  fell backwards last Thursday with sit>stands in boxing class.  wife reports that he tends to fall backwards and to the right.    Pertinent History Parkinson's Disease.   PMH:  hx of falls (wears helment), arthritis, asthma, CAD, GERD, HOH, HLD, HTN, hyperthyroidism, anxiety, aortic stenosis, hx of aortic valve replacement 2006, memory difficulty,  pacemaker, persistent a-fib, hx of scarlet fever, shortness of breath, hx of CABG 2006, cholecystectomy 2015    Limitations fall risk (wears helment and pads), freezing, hard of hearing--reads lips (**wear clear mask)    Patient Stated Goals improve coordination    Currently in Pain? No/denies               Reinforced large amplitude/mobility strategies during ambulation and sit>stands as appropriate.  Pt with improvement with cueing, but needed consistent min cueing for sit>stand and with festination/freezing in doorway, with turns, and crowded spaces.    Standing at counter, functional reaching with each UE with focus on large amplitude movements, particularly with LUE (feet apart, incorporating wt. Shift and trunk rotation, and opening hand).  Pt with difficulty with timing of grasp, particularly with LUE.           OT Education - 03/24/21 0950     Education Details Updates to Coordination/hand HEP with focus on timing and large amplitude movements--see pt instructions    Person(s) Educated Patient;Spouse    Methods Explanation;Demonstration;Handout;Verbal cues;Tactile cues    Comprehension Verbalized understanding;Returned demonstration;Verbal cues required;Tactile cues required;Need further instruction              OT Short Term Goals - 02/25/21 2123       OT SHORT TERM GOAL #1   Title Pt will perform PD-specific HEP with cueing prn from caregiver--check STGs 03/27/21    Time 4    Period Weeks    Status New      OT SHORT TERM GOAL #2   Title Pt will improve R hand coordination for ADLs (buttoning/tying shoes) as shown by completing 9-hole peg test in 75sec or less.    Baseline 88.31sec    Time 4    Period Weeks    Status New      OT SHORT TERM GOAL #3   Title Pt will improve L hand coordination for ADLs (buttoning/tying shoes) as shown by completing 9-hole peg test in 90 sec or less.    Baseline 108.94sec    Time 4    Period Weeks    Status New      OT SHORT TERM GOAL #4   Title Pt/caregiver will verbalize understanding of ways to decr risk of future complcations related to PD (including falls).    Time 4    Period Weeks    Status New               OT Long Term Goals - 02/25/21 2128       OT LONG TERM GOAL #1   Title Pt/caregiver will verbalize understanding of AE/strategies to incr ease, safety, and independence with ADLs/IADLs.--check LTGs 04/26/21    Time 8    Period Weeks    Status New      OT LONG TERM GOAL #2   Title Pt will improve R hand coordination for ADLs (buttoning/tying shoes) as shown by completing 9-hole peg test in 65sec or less.    Time 8    Period Weeks    Status New      OT LONG TERM GOAL #3   Title Pt will improve L hand coordination for ADLs (buttoning/tying shoes) as shown by completing 9-hole peg test in 75 sec or less.    Time 8     Period Weeks    Status New      OT LONG TERM GOAL #4   Title Pt will improve functional reaching/coordination for ADLs  as shown by improving score on box and blocks test by at least 5 with dominant LUE.    Baseline 32 blocks    Time 8    Period Weeks    Status New      OT LONG TERM GOAL #5   Title Pt/caregiver will verbalize understanding of appropriate community resources.    Time 8    Period Weeks    Status New                   Plan - 03/24/21 0846     Clinical Impression Statement Pt is slowly progressing towards goals due to severity of deficits. Pt continues to demo difficulty with timing including freezing and festination and needs cueing for this.  However, pt with improved response to cueing for gait/mobility and UE functional use today.    OT Occupational Profile and History Detailed Assessment- Review of Records and additional review of physical, cognitive, psychosocial history related to current functional performance    Occupational performance deficits (Please refer to evaluation for details): ADL's;IADL's;Leisure    Body Structure / Function / Physical Skills Balance;ADL;Decreased knowledge of use of DME;UE functional use;IADL;Improper spinal/pelvic alignment;Vision;Mobility;Coordination;Decreased knowledge of precautions;FMC    Rehab Potential Good    Clinical Decision Making Several treatment options, min-mod task modification necessary    Comorbidities Affecting Occupational Performance: May have comorbidities impacting occupational performance    Modification or Assistance to Complete Evaluation  Min-Moderate modification of tasks or assist with assess necessary to complete eval    OT Frequency 2x / week    OT Duration 8 weeks   +eval   OT Treatment/Interventions Self-care/ADL training;Moist Heat;DME and/or AE instruction;Therapeutic activities;Therapeutic exercise;Cognitive remediation/compensation;Visual/perceptual remediation/compensation;Passive range of  motion;Functional Mobility Training;Neuromuscular education;Cryotherapy;Energy conservation;Manual Therapy;Patient/family education    Plan continue with ADL stretageies, progess HEP as able (?add PWR! up in sitting for posture), large amplitude movements, reinforce functional mobility strategies as appropriate    Consulted and Agree with Plan of Care Patient;Family member/caregiver    Family Member Consulted wife             Patient will benefit from skilled therapeutic intervention in order to improve the following deficits and impairments:   Body Structure / Function / Physical Skills: Balance, ADL, Decreased knowledge of use of DME, UE functional use, IADL, Improper spinal/pelvic alignment, Vision, Mobility, Coordination, Decreased knowledge of precautions, FMC       Visit Diagnosis: Other symptoms and signs involving the nervous system  Other abnormalities of gait and mobility  Unsteadiness on feet  Other lack of coordination  Visuospatial deficit  Abnormal posture  Tremor    Problem List Patient Active Problem List   Diagnosis Date Noted   HOH (hard of hearing) 12/18/2020   Personal history of colonic polyps 08/31/2016   Abnormality of gait 08/27/2014   Memory difficulty 08/27/2014   Atrial fibrillation (HCC) 09/08/2013   Cholelithiases 07/19/2013   Cholelithiasis 07/19/2013   Encounter for therapeutic drug monitoring 07/03/2013   Aortic valve replaced 04/05/2013   Parkinson disease (HCC) 09/27/2012   Orthostatic hypotension 04/01/2012   Pacemaker-Medtronic 03/29/2012   HEPATITIS A 06/04/2010   HYPERTHYROIDISM 06/04/2010   HYPERLIPIDEMIA 06/04/2010   Essential hypertension 06/04/2010   CAD 06/04/2010   AORTIC STENOSIS 06/04/2010   SICK SINUS SYNDROME 06/04/2010   HIATAL HERNIA 06/04/2010   SHORTNESS OF BREATH 06/04/2010   CHEST PAIN 06/04/2010    Cinda Hara, OT/L 03/24/2021, 9:55 AM  [facility] [address]  [place], [place], [address] Phone: [phone]   Fax:  [phone]  Name: Donald [name] MRN: [id number] Date of Birth: [birth year]  Vianne Bulls, OTR/L [facility] [address] [place], [place]  [address] [phone] phone [phone] 03/24/21 9:55 AM

## 2021-03-24 NOTE — Patient Instructions (Signed)
  Hand Exercises:  PWR! Hands: Push hands out BIG. Elbows straight, wrists up, fingers open and spread apart BIG.  x10 **Perform PWR! Hands throughout the day when you are having trouble using your hands (picking up/manipulating small objects, writing, eating, typing, buttoning, etc.).   Flipping Cards: Place deck of cards on the table. Flip cards over by opening your hand big to grasp and then turn your palm up big, opening hand fully to release.  Go slow and watch your hand. Open hand big to pick up coins and place in coin bank or container: Open hand big and pick up with big, intentional movements. Do not drag coin to the edge.  Place container out from you on table so that you have to extend elbow when reaching.  Go slow and watch your hand and don't release coin until it is all the way in the opening. Open hand big to pick up plastic cups/bottles with each hand.  Then open hand fully to release.  Go slow and deliberate    Open bottle caps/jars: Turn as much/as big as you can with each turn moving wrist then open hand big before turning again.   Have arms out in front of you and turn palms up and down big, slow and deliberate, x10.   **If your movement gets small.  Stop and restart big and slow.

## 2021-03-24 NOTE — Therapy (Addendum)
[facility] [address] [place], [place], [address] Phone: [phone]   Fax:  [phone]  Physical Therapy Treatment/10th Visit Progress Note  Patient Details  Name: Donald [name] MRN: [id number] Date of Birth: 05/13/1941 Referring Provider (PT): Dr. Carles Collet   10th Visit Physical Therapy Progress Note  Dates of Reporting Period: 01/29/21 to 03/24/21    Encounter Date: 03/24/2021   PT End of Session - 03/24/21 1046     Visit Number 10    Number of Visits 17    Date for PT Re-Evaluation 04/29/21    Authorization Type HTA - $30 co pay    PT Start Time 2426   PT able to see pt early   PT Stop Time 1038    PT Time Calculation (min) 41 min    Equipment Utilized During Treatment Gait belt    Activity Tolerance Patient tolerated treatment well    Behavior During Therapy [facility] for tasks assessed/performed;Impulsive             Past Medical History:  Diagnosis Date   Abnormality of gait 08/27/2014   Anxiety    Aortic stenosis    s/p AVR by Dr Cyndia Bent   Arthritis    left wrist- probable- not diagonised   Asthma    in the past   CAD (coronary artery disease)    s/p CABG 2006   GERD (gastroesophageal reflux disease)    H/O seasonal allergies    H/O: rheumatic fever    Hearing deficit    Bilateral hearing aids   Hematuria    had CT scan   Hiatal hernia    History of kidney stones    HOH (hard of hearing)    Hyperlipidemia    Hypertension    Hyperthyroidism    Inguinal hernia    right side; watching at this time per wife's report   Memory difficulty 08/27/2014   Pacemaker    Parkinson's disease    Persistent atrial fibrillation ([place])    Scarlet fever    as a child   Shortness of breath    Sick sinus syndrome ([place])    s/p PPM (MDT) 2006    Past Surgical History:  Procedure Laterality Date   AORTIC VALVE REPLACEMENT  2006   CARDIOVERSION N/A 09/14/2013   Procedure: CARDIOVERSION;  Surgeon: Sanda Klein, MD;  Location: [facility];  Service: Cardiovascular;  Laterality: N/A;   CHOLECYSTECTOMY N/A 07/21/2013   Procedure: LAPAROSCOPIC CHOLECYSTECTOMY;  Surgeon: Edward Jolly, MD;  Location: [facility];  Service: General;  Laterality: N/A;   COLONOSCOPY W/ POLYPECTOMY     COLONOSCOPY WITH PROPOFOL N/A 08/31/2016   Procedure: COLONOSCOPY WITH PROPOFOL;  Surgeon: Wilford Corner, MD;  Location: [facility];  Service: Endoscopy;  Laterality: N/A;   CORONARY ARTERY BYPASS GRAFT  2006   DENTAL SURGERY     implants   INSERT / REPLACE / REMOVE PACEMAKER  2006, 2012   MDT for sick sinus syndrome, gen change 8/12 by [place]    There were no vitals filed for this visit.   Subjective Assessment - 03/24/21 0958     Subjective Had a fall at boxing, went a little too fast doing sit to stands and had a fall. Did not get hurt. Got help getting back up.    Pertinent History PMH: PD, CAD (s/p CABG 2006), LD, HTN, a fib, pacemaker, GERD, on anticoagulants    Limitations Walking;House hold  activities;Standing    Patient Stated Goals wants to improve his balance    Currently in Pain? No/denies                               [facility] Adult PT Treatment/Exercise - 03/24/21 1033       Transfers   Transfers Sit to Stand;Stand to Sit    Sit to Stand 5: Supervision;4: Min guard;With upper extremity assist    Stand to Sit 5: Supervision;With upper extremity assist    Comments Worked on mass practice sit <> stands from chair without arm rests (like at rock steady boxing). Cues for SLOWED pace, scooting out towards edge, foot placement and incr forward lean. Discussed having pt use BUE from the front of the chair vs. crossing his arms for incr forward lean when standing. Performed 2 sets of 10, and additional reps throughout session from mat table. Pt able to demo improved stability in standing with no retropulsion. Discussed making sure pt's wife is next to him  giving proper cues for technique at rock steady.      Ambulation/Gait   Ambulation/Gait Yes    Ambulation/Gait Assistance 4: Min guard    Ambulation/Gait Assistance Details Verbal cues to take BIG steps to the red band throughout gait. pt needing to stop every once and a while and readjust his mask to see better. When pt began to demonstrate short/shuffled steps, cues to stop and reset with tall posture, rock through hips and then take a big step.    Ambulation Distance (Feet) 230 Feet    Assistive device Rolling walker    Gait Pattern Step-through pattern;Decreased step length - right;Decreased step length - left    Ambulation Surface Level;Indoor    Gait velocity 12.94 seconds in 20 feet = 1.55 ft/sec    Gait Comments Worked on walking program for home - with BIG steps towards the red. Pt has a long hallway at home (about 2' in the gym), worked on walking the straightaways with incr step length to the red and then when it came to turn pt did better at performing at marching turn (move RW step-march and then move RW and step-march) - does need cues for this. Attempted a wide U turn with cues to take incr steps throughout to the red, but pt unable to sequence this and took shorter shuffled/festinated steps. Printed out instructions for walking program for home and educated pt's wife with cues to perform with patient.                     PT Education - 03/24/21 1045     Education Details Walking program for home, provided handout on local PD resources.    Person(s) Educated Patient;Spouse    Methods Explanation;Demonstration;Handout;Verbal cues    Comprehension Verbalized understanding;Returned demonstration              PT Short Term Goals - 03/11/21 1117       PT SHORT TERM GOAL #1   Title Pt will perform initial HEP with supervision from pt's spouse for strength, balance, and transfers in order to build upon functional gains made in therapy. ALL STGS DUE 02/26/21     Time 4    Period Weeks    Status New    Target Date 02/26/21      PT SHORT TERM GOAL #2   Title Pt and pt's spouse will verbalize understanding of  fall prevention in the home.    Baseline provided handout on 03/11/21    Time 4    Period Weeks    Status Achieved      PT SHORT TERM GOAL #3   Title Pt and pt's spouse will verbalize understanding of techniques to help with freezing/festination episodes in order to demo improved safety with gait.    Baseline Reviewed today in session on 02/25/21 - will continue to benefit from additional practice/repetition with wife practicing cues with pt    Time 4    Period Weeks    Status Partially Met      PT SHORT TERM GOAL #4   Title Pt will perform TUG with RW vs. UStep Walker in 21 seconds or less in order to demo decr fall risk.    Baseline 23.75 seconds, 51 seconds first attempt 42 seconds 2nd attempt    Time 4    Period Weeks    Status Not Met      PT SHORT TERM GOAL #5   Title Pt will perform 5 reps of sit <> stand with BUE support with proper technique and without BLE bracing in order to demo improved safety with functional transfers.    Baseline met on 02/25/21    Time 4    Period Weeks    Status Achieved               PT Long Term Goals - 03/24/21 1051       PT LONG TERM GOAL #1   Title Pt will perform final HEP with supervision from pt's spouse for strength, balance, and transfers in order to build upon functional gains made in therapy. ALL LTGS DUE 03/26/21    Time 8    Period Weeks    Status New      PT LONG TERM GOAL #2   Title Pt and pt's spouse will verbalize understanding of local PD resources.    Baseline provided handout on local PD resources on 03/24/21    Time 8    Period Weeks    Status Achieved      PT LONG TERM GOAL #3   Title Pt will improve gait speed with RW vs. Ustep walker to at least 2.7 ft/sec for improved community mobility.    Baseline 2.49 ft/sec; 1.55 ft/sec with RW on 03/24/21    Time 8     Period Weeks    Status Not Met      PT LONG TERM GOAL #4   Title Pt will perform 5x sit <> stand in 18 seconds or less with UE support in order to demo improved functional transfers and decr fall risk.    Baseline 22.22 seconds, decr eccentric control    Time 8    Period Weeks    Status New      PT LONG TERM GOAL #5   Title Pt will perform TUG with RW vs. UStep Walker in 18.5 seconds or less in order to demo decr fall risk.    Baseline 23.75 seconds with RW    Time 8    Period Weeks    Status New      PT LONG TERM GOAL #6   Title Pt will ambulate at least 230' with RW vs. UStep walker with supervision in order to demo improved household mobility.    Baseline min guard with RW, able to perform distance    Time 8    Period Weeks    Status  Partially Met                   Plan - 03/24/21 2042     Clinical Impression Statement 10th visit progress note: Reviewed proper sit <> stand technique from a chair without arm rests as pt had a fall when performing at rock steady boxing and going too quickly. Pt needing cues to make sure he is out to the edge, proper foot placement, and having nose over toes. Practiced pt pressing up from front of chair with BUE vs. arms crossed. Pt did not meet LTG #3- performed gait speed in 1.55 ft/sec with RW, however, pt demonstrating improved technique and slowed down speed for safety with improved stride length. Added a walking program for home down pt's hallway to perform with wife's cueing - did do best with marching turns but does need cues for proper technique. Will continue to progress towards LTGs. LTG date updated in [place].    Personal Factors and Comorbidities Comorbidity 3+;Time since onset of injury/illness/exacerbation;Past/Current Experience;Behavior Pattern    Comorbidities PD (diagnosed 2012), CAD (s/p CABG 2006), LD, HTN, a fib, pacemaker, GERD, on anticoagulants    Examination-Activity Limitations Bed Mobility;Caring for  Others;Toileting;Transfers;Stairs;Locomotion Level;Stand;Squat;Reach Overhead    Designer, fashion/clothing;Yard Work    [employer]chant [employer] officer Evolving/Moderate complexity    Rehab Potential Good    PT Frequency 2x / week    PT Duration 12 weeks    PT Treatment/Interventions ADLs/Self Care Home Management;DME Instruction;Therapeutic activities;Functional mobility training;Gait training;Stair training;Therapeutic exercise;Balance training;Neuromuscular re-education;Patient/family education;Energy conservation;Vestibular    PT Next Visit Plan how was walking program at home? Pt is very hard of hearing even with hearing aids (need to wear clear mask). Continue gait and turning training with RW - working on cues for incr stride length and working on slowed pace, using red tband as big kicks/steps to the red. Pt seemed most challenged when gets distracted. standing foot clearance/stepping strategy activities and weight shifting. Working on turns.    Consulted and Agree with Plan of Care Patient;Family member/caregiver    Family Member Consulted pt's [name]             Patient will benefit from skilled therapeutic intervention in order to improve the following deficits and impairments:  Abnormal gait, Decreased activity tolerance, Decreased coordination, Decreased balance, Decreased endurance, Decreased knowledge of use of DME, Decreased safety awareness, Decreased mobility, Decreased strength, Difficulty walking, Impaired flexibility, Postural dysfunction  Visit Diagnosis: Other symptoms and signs involving the nervous system  Other abnormalities of gait and mobility  Unsteadiness on feet  Other lack of coordination     Problem List Patient Active Problem List   Diagnosis Date Noted   HOH (hard of hearing) 12/18/2020   Personal history of colonic polyps 08/31/2016   Abnormality of gait 08/27/2014   Memory  difficulty 08/27/2014   Atrial fibrillation ([place]) 09/08/2013   Cholelithiases 07/19/2013   Cholelithiasis 07/19/2013   Encounter for therapeutic drug monitoring 07/03/2013   Aortic valve replaced 04/05/2013   Parkinson disease ([place]) 09/27/2012   Orthostatic hypotension 04/01/2012   Pacemaker-[employer] 03/29/2012   HEPATITIS A 06/04/2010   HYPERTHYROIDISM 06/04/2010   HYPERLIPIDEMIA 06/04/2010   Essential hypertension 06/04/2010   CAD 06/04/2010   AORTIC STENOSIS 06/04/2010   SICK SINUS SYNDROME 06/04/2010   HIATAL HERNIA 06/04/2010   SHORTNESS OF BREATH 06/04/2010   CHEST PAIN 06/04/2010    Arliss Journey, PT, DPT  03/24/2021, 8:52 PM  [facility] [address] [place], [place], [address] Phone: [phone]   Fax:  [phone]  Name: Donald [name] MRN: [id number] Date of Birth: [birth year]

## 2021-03-24 NOTE — Patient Instructions (Signed)
Walking program:   Walk down and back in your hallway at home 3 times, aiming for 2-3 times a day. Before beginning to walk, rock through your hips and then take a big step.  Working on taking BIG steps to the red each time with picking up your feet. When going to turn - keep the walker close, move the walker, march high-step, move the walker, march high-step, until you turn to the direction you need to go.

## 2021-03-25 ENCOUNTER — Ambulatory Visit: Payer: PPO | Admitting: Physical Therapy

## 2021-03-25 ENCOUNTER — Other Ambulatory Visit: Payer: Self-pay | Admitting: Neurology

## 2021-03-25 DIAGNOSIS — G20A1 Parkinson's disease without dyskinesia, without mention of fluctuations: Secondary | ICD-10-CM

## 2021-03-25 DIAGNOSIS — G20 Parkinson's disease: Secondary | ICD-10-CM

## 2021-03-26 ENCOUNTER — Ambulatory Visit: Payer: PPO | Admitting: Speech Pathology

## 2021-03-26 ENCOUNTER — Ambulatory Visit: Payer: PPO | Admitting: Occupational Therapy

## 2021-03-26 ENCOUNTER — Other Ambulatory Visit: Payer: Self-pay

## 2021-03-26 ENCOUNTER — Ambulatory Visit: Payer: PPO

## 2021-03-26 DIAGNOSIS — R41842 Visuospatial deficit: Secondary | ICD-10-CM | POA: Diagnosis not present

## 2021-03-26 DIAGNOSIS — R471 Dysarthria and anarthria: Secondary | ICD-10-CM

## 2021-03-26 DIAGNOSIS — R2689 Other abnormalities of gait and mobility: Secondary | ICD-10-CM

## 2021-03-26 DIAGNOSIS — Z9181 History of falling: Secondary | ICD-10-CM

## 2021-03-26 DIAGNOSIS — R251 Tremor, unspecified: Secondary | ICD-10-CM | POA: Diagnosis not present

## 2021-03-26 DIAGNOSIS — R1312 Dysphagia, oropharyngeal phase: Secondary | ICD-10-CM | POA: Diagnosis not present

## 2021-03-26 DIAGNOSIS — R278 Other lack of coordination: Secondary | ICD-10-CM

## 2021-03-26 DIAGNOSIS — R2681 Unsteadiness on feet: Secondary | ICD-10-CM

## 2021-03-26 DIAGNOSIS — M6281 Muscle weakness (generalized): Secondary | ICD-10-CM | POA: Diagnosis not present

## 2021-03-26 DIAGNOSIS — R1310 Dysphagia, unspecified: Secondary | ICD-10-CM | POA: Diagnosis not present

## 2021-03-26 DIAGNOSIS — R29818 Other symptoms and signs involving the nervous system: Secondary | ICD-10-CM | POA: Diagnosis not present

## 2021-03-26 DIAGNOSIS — R293 Abnormal posture: Secondary | ICD-10-CM | POA: Diagnosis not present

## 2021-03-26 NOTE — Patient Instructions (Addendum)
   Try pills 1 at a time, then extra sip of water  Add the Tongue press on the roof of your mouth  With high pitch HEEE, it may help to get low pitch then high for the contrast  With the towel roll, hold for 7 seconds 10x twice a day

## 2021-03-26 NOTE — Therapy (Signed)
[facility] [address] [place], [place], [address] Phone: [phone]   Fax:  [phone]  Physical Therapy Treatment  Patient Details  Name: Donald [name] MRN: [id number] Date of Birth: 1940/05/27 Referring Provider (PT): Dr. Carles Collet   Encounter Date: 03/26/2021   PT End of Session - 03/26/21 1312     Visit Number 11    Number of Visits 17    Date for PT Re-Evaluation 04/29/21    Authorization Type HTA - $30 co pay    PT Start Time 0935    PT Stop Time 1015    PT Time Calculation (min) 40 min    Equipment Utilized During Treatment Gait belt    Activity Tolerance Patient tolerated treatment well    Behavior During Therapy [facility] for tasks assessed/performed;Impulsive             Past Medical History:  Diagnosis Date   Abnormality of gait 08/27/2014   Anxiety    Aortic stenosis    s/p AVR by Dr Cyndia Bent   Arthritis    left wrist- probable- not diagonised   Asthma    in the past   CAD (coronary artery disease)    s/p CABG 2006   GERD (gastroesophageal reflux disease)    H/O seasonal allergies    H/O: rheumatic fever    Hearing deficit    Bilateral hearing aids   Hematuria    had CT scan   Hiatal hernia    History of kidney stones    HOH (hard of hearing)    Hyperlipidemia    Hypertension    Hyperthyroidism    Inguinal hernia    right side; watching at this time per wife's report   Memory difficulty 08/27/2014   Pacemaker    Parkinson's disease    Persistent atrial fibrillation ([place])    Scarlet fever    as a child   Shortness of breath    Sick sinus syndrome ([place])    s/p PPM (MDT) 2006    Past Surgical History:  Procedure Laterality Date   AORTIC VALVE REPLACEMENT  2006   CARDIOVERSION N/A 09/14/2013   Procedure: CARDIOVERSION;  Surgeon: Sanda Klein, MD;  Location: [facility];  Service: Cardiovascular;  Laterality: N/A;   CHOLECYSTECTOMY N/A 07/21/2013   Procedure: LAPAROSCOPIC  CHOLECYSTECTOMY;  Surgeon: Edward Jolly, MD;  Location: [facility];  Service: General;  Laterality: N/A;   COLONOSCOPY W/ POLYPECTOMY     COLONOSCOPY WITH PROPOFOL N/A 08/31/2016   Procedure: COLONOSCOPY WITH PROPOFOL;  Surgeon: Wilford Corner, MD;  Location: [facility];  Service: Endoscopy;  Laterality: N/A;   CORONARY ARTERY BYPASS GRAFT  2006   DENTAL SURGERY     implants   INSERT / REPLACE / REMOVE PACEMAKER  2006, 2012   MDT for sick sinus syndrome, gen change 8/12 by [place]    There were no vitals filed for this visit.   Subjective Assessment - 03/26/21 0937     Subjective Pt reports no falls since the last one. Pt reports no changes.    Pertinent History PMH: PD, CAD (s/p CABG 2006), LD, HTN, a fib, pacemaker, GERD, on anticoagulants    Limitations Walking;House hold activities;Standing    Patient Stated Goals wants to improve his balance    Currently in Pain? No/denies  [facility] Adult PT Treatment/Exercise - 03/26/21 0946       Transfers   Transfers Sit to Stand;Stand to Sit    Sit to Stand 5: Supervision    Sit to Stand Details (indicate cue type and reason) No UE assist from chair    Five time sit to stand comments  12.14 seconds from standard height chair, braces BLE against chair in standing. Uncontrolled descent w/o use of armrest.    Stand to Sit 5: Supervision      Ambulation/Gait   Ambulation/Gait Yes    Ambulation/Gait Assistance 4: Min guard    Ambulation/Gait Assistance Details Verbal cues to take big steps toward red theraband. PT provided verbal cues to remind pt to stop when he would start shuffling and to take his time and take big steps while turning. PT provided supervision guard.    Ambulation Distance (Feet) 345 Feet    Assistive device Rolling walker    Gait Pattern Step-through pattern;Decreased step length - right;Decreased step length - left    Ambulation Surface  Level;Indoor    Gait velocity 14.45 seconds in 55m2.26 ft/sec    Gait Comments Pt performed slalom walking between 3 cones 2x3 laps w/ focus on taking big steps and going slow. Festination increased as pt tried to increase speed. PT had to use verbal cues to tell pt to stop and refocus on taking big steps toward the red theraband. PT provide min guard.      Balance   Balance Assessed Yes      Standardized Balance Assessment   Standardized Balance Assessment Timed Up and Go Test      Timed Up and Go Test   TUG Normal TUG    Normal TUG (seconds) 18.54   Pt demonstrated most difficulty w/ turning and controlling descent back into chair.     Neuro Re-ed    Neuro Re-ed Details  Stepping towards dots diagonally  w/o UE support. Pt performed 2x8 steps BLE w/ focus on taking big steps and coming back in a controlled manner. Pt showed increased difficulty w/ the step back and while stepping forward w/ his LLE. PT provided verbal cues to remind pt to go slow and to take big steps. PT provided min guard .                       PT Short Term Goals - 03/11/21 1117       PT SHORT TERM GOAL #1   Title Pt will perform initial HEP with supervision from pt's spouse for strength, balance, and transfers in order to build upon functional gains made in therapy. ALL STGS DUE 02/26/21    Time 4    Period Weeks    Status New    Target Date 02/26/21      PT SHORT TERM GOAL #2   Title Pt and pt's spouse will verbalize understanding of fall prevention in the home.    Baseline provided handout on 03/11/21    Time 4    Period Weeks    Status Achieved      PT SHORT TERM GOAL #3   Title Pt and pt's spouse will verbalize understanding of techniques to help with freezing/festination episodes in order to demo improved safety with gait.    Baseline Reviewed today in session on 02/25/21 - will continue to benefit from additional practice/repetition with wife practicing cues with pt    Time 4     Period Weeks  Status Partially Met      PT SHORT TERM GOAL #4   Title Pt will perform TUG with RW vs. UStep Walker in 21 seconds or less in order to demo decr fall risk.    Baseline 23.75 seconds, 51 seconds first attempt 42 seconds 2nd attempt    Time 4    Period Weeks    Status Not Met      PT SHORT TERM GOAL #5   Title Pt will perform 5 reps of sit <> stand with BUE support with proper technique and without BLE bracing in order to demo improved safety with functional transfers.    Baseline met on 02/25/21    Time 4    Period Weeks    Status Achieved               PT Long Term Goals - 03/26/21 1325       PT LONG TERM GOAL #1   Title Pt will perform final HEP with supervision from pt's spouse for strength, balance, and transfers in order to build upon functional gains made in therapy. ALL LTGS DUE 04/21/21    Baseline 03/26/21 PT continues to add to HEP    Time 12    Period Weeks    Status On-going      PT LONG TERM GOAL #2   Title Pt and pt's spouse will verbalize understanding of local PD resources.    Baseline provided handout on local PD resources on 03/24/21    Time 12    Period Weeks    Status Achieved      PT LONG TERM GOAL #3   Title Pt will improve gait speed with RW vs. Ustep walker to at least 2.2 ft/sec for improved community mobility.    Baseline 2.49 ft/sec; 1.55 ft/sec with RW on 03/24/21, 14.45 seconds= 2.26 ft/sec with RW 03/26/21    Time 12    Period Weeks    Status Achieved      PT LONG TERM GOAL #4   Title Pt will perform 5x sit <> stand in 18 seconds or less with UE support in order with proper technique to demo improved functional transfers and decr fall risk.    Baseline 22.22 seconds, decr eccentric control; 12.14 seconds w/o use of UE, w decr eccentric control 03/26/21    Time 12    Period Weeks    Status Achieved      PT LONG TERM GOAL #5   Title Pt will perform TUG with RW vs. UStep Walker in 18.5 seconds or less in order to demo decr  fall risk.    Baseline 23.75 seconds with RW, 18.54 seconds with RW 03/26/21    Time 12    Period Weeks    Status Partially Met      PT LONG TERM GOAL #6   Title Pt will ambulate at least 230' with RW vs. UStep walker with supervision in order to demo improved household mobility.    Baseline min guard with RW, able to perform distance, 345 ft w/ min guard/verbal cues to take big steps 03/26/21    Time 12    Period Weeks    Status Partially Met             Updated LTGs:  PT Long Term Goals - 03/26/21 1413       PT LONG TERM GOAL #1   Title Pt will perform final HEP with supervision from pt's spouse for strength, balance, and  transfers in order to build upon functional gains made in therapy. ALL LTGS DUE 04/21/21    Baseline 03/26/21 PT continues to add to HEP    Time 12    Period Weeks    Status On-going    Target Date 04/21/21      PT LONG TERM GOAL #2   Title Pt will ambulate >500' with RW supervision on varied level surfaces for improved household and short community distances.    Time 12    Period Weeks    Status Revised    Target Date 04/21/21      PT LONG TERM GOAL #3   Title Pt will be able to perform 5 x sit to stand in <12 sec without hands but controlling descent for improved balance and functional mobility.    Baseline 22.22 seconds, decr eccentric control; 12.14 seconds w/o use of UE, w decr eccentric control 03/26/21    Time 12    Period Weeks    Status Revised    Target Date 04/21/21      PT LONG TERM GOAL #4   Title Pt will perform TUG with RW in <16 sec for improved balance and functional mobility.    Baseline 23.75 seconds with RW, 18.54 seconds with RW 03/26/21    Time 12    Period Weeks    Status Revised    Target Date 04/21/21                  Plan - 03/26/21 1328     Clinical Impression Statement Pt presents to the clinci w/ some improvements in gait speed and ability to walk for 345' w/ RW. Pt still presents with deficits in gait w/  festination and requires verbal cues to slow down and focus on taking bigger steps. This is most prevelant in when the pt knows he is being timed and when turning. Pt was able to meet some LTG by walking w/ a gait speed of 2.26 ft/sec and decreasing 5x sit <> stand time to 12.14 seconds. PT still required to address festination present w/ increased speed and to work on eccentric descent with sit <> stand for improved community ambulation and functional mobility. PT will continue to progress pt as tolerated w/ activities to achieve pt's revised LTG.    Personal Factors and Comorbidities Comorbidity 3+;Time since onset of injury/illness/exacerbation;Past/Current Experience;Behavior Pattern    Comorbidities PD (diagnosed 2012), CAD (s/p CABG 2006), LD, HTN, a fib, pacemaker, GERD, on anticoagulants    Examination-Activity Limitations Bed Mobility;Caring for Others;Toileting;Transfers;Stairs;Locomotion Level;Stand;Squat;Reach Overhead    Designer, fashion/clothing;Yard Work    Merchant [employer] officer Evolving/Moderate complexity    Rehab Potential Good    PT Frequency 2x / week    PT Duration 12 weeks    PT Treatment/Interventions ADLs/Self Care Home Management;DME Instruction;Therapeutic activities;Functional mobility training;Gait training;Stair training;Therapeutic exercise;Balance training;Neuromuscular re-education;Patient/family education;Energy conservation;Vestibular    PT Next Visit Plan how was walking program at home? Pt is very hard of hearing even with hearing aids (need to wear clear mask). Continue gait and turning training with RW - working on cues for incr stride length and working on slowed pace, using red tband as big kicks/steps to the red. Pt seemed most challenged when gets distracted. standing foot clearance/stepping strategy activities and weight shifting. Working on turns.    Consulted and Agree with Plan of Care  Patient;Family member/caregiver    Family Member Consulted pt's [name]  Patient will benefit from skilled therapeutic intervention in order to improve the following deficits and impairments:  Abnormal gait, Decreased activity tolerance, Decreased coordination, Decreased balance, Decreased endurance, Decreased knowledge of use of DME, Decreased safety awareness, Decreased mobility, Decreased strength, Difficulty walking, Impaired flexibility, Postural dysfunction  Visit Diagnosis: Other abnormalities of gait and mobility  Unsteadiness on feet  Abnormal posture  History of falling  Other symptoms and signs involving the nervous system     Problem List Patient Active Problem List   Diagnosis Date Noted   HOH (hard of hearing) 12/18/2020   Personal history of colonic polyps 08/31/2016   Abnormality of gait 08/27/2014   Memory difficulty 08/27/2014   Atrial fibrillation ([place]) 09/08/2013   Cholelithiases 07/19/2013   Cholelithiasis 07/19/2013   Encounter for therapeutic drug monitoring 07/03/2013   Aortic valve replaced 04/05/2013   Parkinson disease ([place]) 09/27/2012   Orthostatic hypotension 04/01/2012   Pacemaker-Medtronic 03/29/2012   HEPATITIS A 06/04/2010   HYPERTHYROIDISM 06/04/2010   HYPERLIPIDEMIA 06/04/2010   Essential hypertension 06/04/2010   CAD 06/04/2010   AORTIC STENOSIS 06/04/2010   SICK SINUS SYNDROME 06/04/2010   HIATAL HERNIA 06/04/2010   SHORTNESS OF BREATH 06/04/2010   CHEST PAIN 06/04/2010    Lottie Mussel, Student-PT 03/26/2021, 2:10 PM  [facility] [address] [place], [place], [address] Phone: [phone]   Fax:  [phone]  Name: Donald [name] MRN: [id number] Date of Birth: 11-23-1940

## 2021-03-26 NOTE — Progress Notes (Signed)
Remote pacemaker transmission.   

## 2021-03-26 NOTE — Therapy (Signed)
[facility] [address] [place], [place], [address] Phone: [phone]   Fax:  [phone]  Occupational Therapy Treatment  Patient Details  Name: Donald [name] MRN: [id number] Date of Birth: [birth year] Referring Provider (OT): Dr. Wells Guiles Tat   Encounter Date: 03/26/2021   OT End of Session - 03/26/21 1021     Visit Number 6    Number of Visits 17    Date for OT Re-Evaluation 04/26/21    Authorization Type [employer], follow Medicare guidelines, copay per day    Authorization - Visit Number 6    Authorization - Number of Visits 10    Progress Note Due on Visit 10    OT Start Time 1020    OT Stop Time 1100    OT Time Calculation (min) 40 min             Past Medical History:  Diagnosis Date   Abnormality of gait 08/27/2014   Anxiety    Aortic stenosis    s/p AVR by Dr Cyndia Bent   Arthritis    left wrist- probable- not diagonised   Asthma    in the past   CAD (coronary artery disease)    s/p CABG 2006   GERD (gastroesophageal reflux disease)    H/O seasonal allergies    H/O: rheumatic fever    Hearing deficit    Bilateral hearing aids   Hematuria    had CT scan   Hiatal hernia    History of kidney stones    HOH (hard of hearing)    Hyperlipidemia    Hypertension    Hyperthyroidism    Inguinal hernia    right side; watching at this time per wife's report   Memory difficulty 08/27/2014   Pacemaker    Parkinson's disease    Persistent atrial fibrillation ([place])    Scarlet fever    as a child   Shortness of breath    Sick sinus syndrome ([place])    s/p PPM (MDT) 2006    Past Surgical History:  Procedure Laterality Date   AORTIC VALVE REPLACEMENT  2006   CARDIOVERSION N/A 09/14/2013   Procedure: CARDIOVERSION;  Surgeon: Sanda Klein, MD;  Location: [facility];  Service: Cardiovascular;  Laterality: N/A;   CHOLECYSTECTOMY N/A 07/21/2013   Procedure: LAPAROSCOPIC CHOLECYSTECTOMY;   Surgeon: Edward Jolly, MD;  Location: [facility];  Service: General;  Laterality: N/A;   COLONOSCOPY W/ POLYPECTOMY     COLONOSCOPY WITH PROPOFOL N/A 08/31/2016   Procedure: COLONOSCOPY WITH PROPOFOL;  Surgeon: Wilford Corner, MD;  Location: [facility] END[facility]COPY;  Service: Endoscopy;  Laterality: N/A;   CORONARY ARTERY BYPASS GRAFT  2006   DENTAL SURGERY     implants   INSERT / REPLACE / REMOVE PACEMAKER  2006, 2012   MDT for sick sinus syndrome, gen change 8/12 by [place]    There were no vitals filed for this visit.   Subjective Assessment - 03/26/21 1422     Subjective  Denies pain    Pertinent History Parkinson's Disease.   PMH:  hx of falls (wears helment), arthritis, asthma, CAD, GERD, HOH, HLD, HTN, hyperthyroidism, anxiety, aortic stenosis, hx of aortic valve replacement 2006, memory difficulty, pacemaker, persistent a-fib, hx of scarlet fever, shortness of breath, hx of CABG 2006, cholecystectomy 2015    Limitations fall risk (wears helment and pads), freezing, hard of hearing--reads lips (**wear clear mask)    Patient Stated  Goals improve coordination    Currently in Pain? No/denies                    Treatment: Standing at table functional reaching with trunk rotation, min guard for balance, mod v.c for amplitude, gait belt utilized, Arm bike x 5 min s level 1 for conditioning, pt maintained 40 rpm, min v.c to avoid exceeding 55 rpm for more control. Ambulating from gym to hallway with min A, v.c for step length              OT Education - 03/26/21 1428     Education Details Reviewed Update to Coordination/hand HEP issued last visit mod v.c for timing and larger amplitude movements, pt's wife is able to cue him, increased time required.    Person(s) Educated Patient;Spouse    Methods Explanation;Demonstration;Handout;Verbal cues;Tactile cues    Comprehension Verbalized understanding;Returned demonstration;Verbal cues  required;Tactile cues required              OT Short Term Goals - 02/25/21 2123       OT SHORT TERM GOAL #1   Title Pt will perform PD-specific HEP with cueing prn from caregiver--check STGs 03/27/21    Time 4    Period Weeks    Status New      OT SHORT TERM GOAL #2   Title Pt will improve R hand coordination for ADLs (buttoning/tying shoes) as shown by completing 9-hole peg test in 75sec or less.    Baseline 88.31sec    Time 4    Period Weeks    Status New      OT SHORT TERM GOAL #3   Title Pt will improve L hand coordination for ADLs (buttoning/tying shoes) as shown by completing 9-hole peg test in 90 sec or less.    Baseline 108.94sec    Time 4    Period Weeks    Status New      OT SHORT TERM GOAL #4   Title Pt/caregiver will verbalize understanding of ways to decr risk of future complcations related to PD (including falls).    Time 4    Period Weeks    Status New               OT Long Term Goals - 02/25/21 2128       OT LONG TERM GOAL #1   Title Pt/caregiver will verbalize understanding of AE/strategies to incr ease, safety, and independence with ADLs/IADLs.--check LTGs 04/26/21    Time 8    Period Weeks    Status New      OT LONG TERM GOAL #2   Title Pt will improve R hand coordination for ADLs (buttoning/tying shoes) as shown by completing 9-hole peg test in 65sec or less.    Time 8    Period Weeks    Status New      OT LONG TERM GOAL #3   Title Pt will improve L hand coordination for ADLs (buttoning/tying shoes) as shown by completing 9-hole peg test in 75 sec or less.    Time 8    Period Weeks    Status New      OT LONG TERM GOAL #4   Title Pt will improve functional reaching/coordination for ADLs as shown by improving score on box and blocks test by at least 5 with dominant LUE.    Baseline 32 blocks    Time 8    Period Weeks    Status New  OT LONG TERM GOAL #5   Title Pt/caregiver will verbalize understanding of appropriate  community resources.    Time 8    Period Weeks    Status New                   Plan - 03/26/21 1431     Clinical Impression Statement Pt is progressing slowly due to severity of deficits. Pt requires v.c for timing and amplitude.    OT Occupational Profile and History Detailed Assessment- Review of Records and additional review of physical, cognitive, psychosocial history related to current functional performance    Occupational performance deficits (Please refer to evaluation for details): ADL's;IADL's;Leisure    Body Structure / Function / Physical Skills Balance;ADL;Decreased knowledge of use of DME;UE functional use;IADL;Improper spinal/pelvic alignment;Vision;Mobility;Coordination;Decreased knowledge of precautions;FMC    Rehab Potential Good    Clinical Decision Making Several treatment options, min-mod task modification necessary    Comorbidities Affecting Occupational Performance: May have comorbidities impacting occupational performance    Modification or Assistance to Complete Evaluation  Min-Moderate modification of tasks or assist with assess necessary to complete eval    OT Frequency 2x / week    OT Duration 8 weeks   +eval   OT Treatment/Interventions Self-care/ADL training;Moist Heat;DME and/or AE instruction;Therapeutic activities;Therapeutic exercise;Cognitive remediation/compensation;Visual/perceptual remediation/compensation;Passive range of motion;Functional Mobility Training;Neuromuscular education;Cryotherapy;Energy conservation;Manual Therapy;Patient/family education    Plan progess HEP as able (?add PWR! up in sitting for posture), large amplitude movements, reinforce functional mobility strategies as appropriate    Consulted and Agree with Plan of Care Patient;Family member/caregiver    Family Member Consulted wife             Patient will benefit from skilled therapeutic intervention in order to improve the following deficits and impairments:   Body  Structure / Function / Physical Skills: Balance, ADL, Decreased knowledge of use of DME, UE functional use, IADL, Improper spinal/pelvic alignment, Vision, Mobility, Coordination, Decreased knowledge of precautions, [place]       Visit Diagnosis: Other symptoms and signs involving the nervous system  Other abnormalities of gait and mobility  Unsteadiness on feet  Other lack of coordination  Abnormal posture  Visuospatial deficit    Problem List Patient Active Problem List   Diagnosis Date Noted   HOH (hard of hearing) 12/18/2020   Personal history of colonic polyps 08/31/2016   Abnormality of gait 08/27/2014   Memory difficulty 08/27/2014   Atrial fibrillation ([place]) 09/08/2013   Cholelithiases 07/19/2013   Cholelithiasis 07/19/2013   Encounter for therapeutic drug monitoring 07/03/2013   Aortic valve replaced 04/05/2013   Parkinson disease ([place]) 09/27/2012   Orthostatic hypotension 04/01/2012   Pacemaker-Medtronic 03/29/2012   HEPATITIS A 06/04/2010   HYPERTHYROIDISM 06/04/2010   HYPERLIPIDEMIA 06/04/2010   Essential hypertension 06/04/2010   CAD 06/04/2010   AORTIC STENOSIS 06/04/2010   SICK SINUS SYNDROME 06/04/2010   HIATAL HERNIA 06/04/2010   SHORTNESS OF BREATH 06/04/2010   CHEST PAIN 06/04/2010    Donald [name], OT/L 03/26/2021, 2:32 PM  [facility] [address] [place], [place], [address] Phone: [phone]   Fax:  [phone]  Name: Donald [name] MRN: [id number] Date of Birth: 1941-03-18

## 2021-03-26 NOTE — Therapy (Signed)
[facility] [address] [place], [place], [address] Phone: [phone]   Fax:  [phone]  Speech Language Pathology Treatment  Patient Details  Name: Donald [name] MRN: [id number] Date of Birth: 1941-04-01 Referring Provider (SLP): Dr. Wells Guiles Tat   Encounter Date: 03/26/2021   End of Session - 03/26/21 1230     Visit Number 3    Number of Visits 25    Date for SLP Re-Evaluation 05/23/21    SLP Start Time 0847    SLP Stop Time  0928    SLP Time Calculation (min) 41 min    Activity Tolerance Patient tolerated treatment well             Past Medical History:  Diagnosis Date   Abnormality of gait 08/27/2014   Anxiety    Aortic stenosis    s/p AVR by Dr Cyndia Bent   Arthritis    left wrist- probable- not diagonised   Asthma    in the past   CAD (coronary artery disease)    s/p CABG 2006   GERD (gastroesophageal reflux disease)    H/O seasonal allergies    H/O: rheumatic fever    Hearing deficit    Bilateral hearing aids   Hematuria    had CT scan   Hiatal hernia    History of kidney stones    HOH (hard of hearing)    Hyperlipidemia    Hypertension    Hyperthyroidism    Inguinal hernia    right side; watching at this time per wife's report   Memory difficulty 08/27/2014   Pacemaker    Parkinson's disease    Persistent atrial fibrillation ([place])    Scarlet fever    as a child   Shortness of breath    Sick sinus syndrome ([place])    s/p PPM (MDT) 2006    Past Surgical History:  Procedure Laterality Date   AORTIC VALVE REPLACEMENT  2006   CARDIOVERSION N/A 09/14/2013   Procedure: CARDIOVERSION;  Surgeon: Sanda Klein, MD;  Location: [facility];  Service: Cardiovascular;  Laterality: N/A;   CHOLECYSTECTOMY N/A 07/21/2013   Procedure: LAPAROSCOPIC CHOLECYSTECTOMY;  Surgeon: Edward Jolly, MD;  Location: [facility];  Service: General;  Laterality: N/A;   COLONOSCOPY W/ POLYPECTOMY     COLONOSCOPY  WITH PROPOFOL N/A 08/31/2016   Procedure: COLONOSCOPY WITH PROPOFOL;  Surgeon: Wilford Corner, MD;  Location: [facility]COPY;  Service: Endoscopy;  Laterality: N/A;   CORONARY ARTERY BYPASS GRAFT  2006   DENTAL SURGERY     implants   INSERT / REPLACE / REMOVE PACEMAKER  2006, 2012   MDT for sick sinus syndrome, gen change 8/12 by [place]    There were no vitals filed for this visit.   Subjective Assessment - 03/26/21 0849     Subjective He does it beforeo he takes his pills    Patient is accompained by: Family member   spouse, Donald [name]   Currently in Pain? No/denies                   ADULT SLP TREATMENT - 03/26/21 0850       General Information   Behavior/Cognition Alert;Cooperative;Requires cueing;Hard of hearing      Treatment Provided   Treatment provided Dysphagia      Dysphagia Treatment   Temperature Spikes Noted No    Respiratory Status Room air    Oral Cavity - Dentition Adequate  natural dentition    Treatment Methods Skilled observation;Therapeutic exercise;Compensation strategy training;Patient/caregiver education    Patient observed directly with PO's Yes    Type of PO's observed Thin liquids;Dysphagia 3 (soft)   pills   Feeding Able to feed self    Liquids provided via Cup    Pharyngeal Phase Signs & Symptoms Wet vocal quality;Immediate throat clear;Delayed cough    Type of cueing Verbal;Visual;Tactile    Amount of cueing Moderate    Other treatment/comments Ongoing training in HEP - Donald [name] achcieved effortfiul swallow with usual min to mod A 7/10x. He completed [place] with max A 2/6x, Initiated training in tongue press with freqeunt mod A, lingual stretch 10x each way with min A. [place] for laryngeal elevation with max A and contrasting low with high pitch 1/8x. Due to [facility], all exercises and cues provided in writing as well as using a clear mask. Initiated training in swallow precautions. With written and verbal cues, Donald [name]  followed small bite, hard swallow alternated with small sip, hard swallow, cues eventually faded to every 3 bites-sips. Provided a table top sign to remind pt of precautions as home. Pt usually takes pills all at once, trained in precaution of taking 1 at a time with occasional min A he tood 3/3 pills individually. He required cues to alternate pill sip with subsequent sip before taking next med. Ongoing education re: silent aspiration      Assessment / Recommendations / Plan   Plan Continue with current plan of care      Progression Toward Goals   Progression toward goals Progressing toward goals              SLP Education - 03/26/21 1229     Education Details swallow precautions, HEP for dysphagia    Person(s) Educated Patient;Spouse    Methods Explanation;Demonstration;Handout;Verbal cues    Comprehension Verbalized understanding;Returned demonstration;Verbal cues required;Need further instruction              SLP Short Term Goals - 03/26/21 1230       SLP SHORT TERM GOAL #1   Title Pt will average 85dB on loud /a/ with occasional min A over 2 sessions.    Time 6    Period Weeks    Status On-going      SLP SHORT TERM GOAL #2   Title Pt will average 68dB on 18/20 sentences with occasional min A    Time 6    Period Weeks    Status On-going      SLP SHORT TERM GOAL #3   Title Pt/spouse will follow 2 diet modificiations with occasional min A over 3 sessions    Time 6    Period Weeks    Status On-going      SLP SHORT TERM GOAL #4   Title Pt will complete HEP for dysphagia with usual min  visual and verbal cues over 2 sessions    Time 6    Period Weeks    Status On-going      SLP SHORT TERM GOAL #5   Title Pt will average 68dB over 5 minute conversation with occasional min A over 2 sessions    Time 6    Period Weeks    Status On-going              SLP Long Term Goals - 03/26/21 1230       SLP LONG TERM GOAL #1   Title Pt will average 85dB on loud /a/  with rare min A over 2 sessions    Time 12    Period Weeks    Status On-going      SLP LONG TERM GOAL #2   Title Pt will average 68dB over 12 minute conversation with occasional min A    Time 12    Period Weeks    Status On-going      SLP LONG TERM GOAL #3   Title Pt will complete HEP for dysarthria and dysphagia with occasional min A over 3 sessions    Time 12    Period Weeks    Status On-going      SLP LONG TERM GOAL #4   Title Pt will follow swallow precautions with occasional min A over 3 sessions    Time 12    Period Weeks    Status On-going      SLP LONG TERM GOAL #5   Title Pt/spouse will verbalize 4 s/s of aspiration pna with rare min A    Time 12    Period Weeks    Status On-going              Plan - 03/26/21 1229     Clinical Impression Statement Pt returns after MBSS 03/12/21 revealed progression of severe oropharyngeal dysphagia with severe pharyngeal residue, greatest with puree and soft solids and silent aspiration across consistentcies. Thin liquids recommended with slow rate. Pt and spouse elect to continue regular solids, therefore recommend alternate solids with a liquid sip. Initiated training in swallow precautions and HEP for dysphagia . Donald [name] reqiures consistent max A for both. Spouse verbalizes risk of aspriation pna should Donald [name] become ill or bed ridden due to illness. Continue skilled ST to maximize safety of swallow and intelliglbity for safety and to reduce caregiver burden.    Speech Therapy Frequency 2x / week    Duration 12 weeks    Treatment/Interventions Aspiration precaution training;Language facilitation;Environmental controls;Cueing hierarchy;SLP instruction and feedback;Pharyngeal strengthening exercises;Compensatory techniques;Cognitive reorganization;Functional tasks;Compensatory strategies;Patient/family education;Multimodal communcation approach;Internal/external aids;Trials of upgraded texture/liquids;Diet toleration management by  SLP;Other (comment)    Potential to Achieve Goals Fair    Potential Considerations Cooperation/participation level;Previous level of function;Medical prognosis;Severity of impairments             Patient will benefit from skilled therapeutic intervention in order to improve the following deficits and impairments:   Dysphagia, oropharyngeal phase  Dysarthria and anarthria    Problem List Patient Active Problem List   Diagnosis Date Noted   HOH (hard of hearing) 12/18/2020   Personal history of colonic polyps 08/31/2016   Abnormality of gait 08/27/2014   Memory difficulty 08/27/2014   Atrial fibrillation ([facility]) 09/08/2013   Cholelithiases 07/19/2013   Cholelithiasis 07/19/2013   Encounter for therapeutic drug monitoring 07/03/2013   Aortic valve replaced 04/05/2013   Parkinson disease (HCC) 09/27/2012   Orthostatic hypotension 04/01/2012   Pacemaker-Medtronic 03/29/2012   HEPATITIS A 06/04/2010   HYPERTHYROIDISM 06/04/2010   HYPERLIPIDEMIA 06/04/2010   Essential hypertension 06/04/2010   CAD 06/04/2010   AORTIC STENOSIS 06/04/2010   SICK SINUS SYNDROME 06/04/2010   HIATAL HERNIA 06/04/2010   SHORTNESS OF BREATH 06/04/2010   CHEST PAIN 06/04/2010    [place] Decoursey, Radene Journey MS, CCC-SLP 03/26/2021, 12:31 PM  [facility] [address] [place], [place], [address] Phone: [phone]   Fax:  [phone]   Name: Donald [name] MRN: [id number] Date of Birth: 03/15/1941

## 2021-03-27 ENCOUNTER — Ambulatory Visit: Payer: PPO | Admitting: Physical Therapy

## 2021-03-31 ENCOUNTER — Ambulatory Visit: Payer: PPO | Admitting: Occupational Therapy

## 2021-03-31 ENCOUNTER — Encounter: Payer: Self-pay | Admitting: Physical Therapy

## 2021-03-31 ENCOUNTER — Other Ambulatory Visit: Payer: Self-pay

## 2021-03-31 ENCOUNTER — Ambulatory Visit: Payer: PPO | Admitting: Physical Therapy

## 2021-03-31 ENCOUNTER — Ambulatory Visit: Payer: PPO | Admitting: Speech Pathology

## 2021-03-31 ENCOUNTER — Encounter: Payer: Self-pay | Admitting: Speech Pathology

## 2021-03-31 ENCOUNTER — Encounter: Payer: Self-pay | Admitting: Occupational Therapy

## 2021-03-31 DIAGNOSIS — R1312 Dysphagia, oropharyngeal phase: Secondary | ICD-10-CM | POA: Diagnosis not present

## 2021-03-31 DIAGNOSIS — R29818 Other symptoms and signs involving the nervous system: Secondary | ICD-10-CM | POA: Diagnosis not present

## 2021-03-31 DIAGNOSIS — R293 Abnormal posture: Secondary | ICD-10-CM | POA: Diagnosis not present

## 2021-03-31 DIAGNOSIS — R278 Other lack of coordination: Secondary | ICD-10-CM | POA: Diagnosis not present

## 2021-03-31 DIAGNOSIS — R251 Tremor, unspecified: Secondary | ICD-10-CM

## 2021-03-31 DIAGNOSIS — R41842 Visuospatial deficit: Secondary | ICD-10-CM | POA: Diagnosis not present

## 2021-03-31 DIAGNOSIS — Z9181 History of falling: Secondary | ICD-10-CM | POA: Diagnosis not present

## 2021-03-31 DIAGNOSIS — R1310 Dysphagia, unspecified: Secondary | ICD-10-CM | POA: Diagnosis not present

## 2021-03-31 DIAGNOSIS — R2681 Unsteadiness on feet: Secondary | ICD-10-CM

## 2021-03-31 DIAGNOSIS — R471 Dysarthria and anarthria: Secondary | ICD-10-CM | POA: Diagnosis not present

## 2021-03-31 DIAGNOSIS — R2689 Other abnormalities of gait and mobility: Secondary | ICD-10-CM

## 2021-03-31 DIAGNOSIS — M6281 Muscle weakness (generalized): Secondary | ICD-10-CM | POA: Diagnosis not present

## 2021-03-31 NOTE — Therapy (Signed)
[facility] [address] [place], [place], [address] Phone: [phone]   Fax:  [phone]  Physical Therapy Treatment  Patient Details  Name: Donald [name] MRN: [id number] Date of Birth: March 13, 1941 Referring Provider (PT): Dr. Carles Collet   Encounter Date: 03/31/2021   PT End of Session - 03/31/21 1025     Visit Number 12    Number of Visits 17    Date for PT Re-Evaluation 04/29/21    Authorization Type HTA - $30 co pay    PT Start Time 1018    PT Stop Time 1102    PT Time Calculation (min) 44 min    Equipment Utilized During Treatment Gait belt    Activity Tolerance Patient tolerated treatment well    Behavior During Therapy [facility] for tasks assessed/performed;Impulsive             Past Medical History:  Diagnosis Date   Abnormality of gait 08/27/2014   Anxiety    Aortic stenosis    s/p AVR by Dr Cyndia Bent   Arthritis    left wrist- probable- not diagonised   Asthma    in the past   CAD (coronary artery disease)    s/p CABG 2006   GERD (gastroesophageal reflux disease)    H/O seasonal allergies    H/O: rheumatic fever    Hearing deficit    Bilateral hearing aids   Hematuria    had CT scan   Hiatal hernia    History of kidney stones    HOH (hard of hearing)    Hyperlipidemia    Hypertension    Hyperthyroidism    Inguinal hernia    right side; watching at this time per wife's report   Memory difficulty 08/27/2014   Pacemaker    Parkinson's disease    Persistent atrial fibrillation ([place])    Scarlet fever    as a child   Shortness of breath    Sick sinus syndrome ([place])    s/p PPM (MDT) 2006    Past Surgical History:  Procedure Laterality Date   AORTIC VALVE REPLACEMENT  2006   CARDIOVERSION N/A 09/14/2013   Procedure: CARDIOVERSION;  Surgeon: Sanda Klein, MD;  Location: [facility];  Service: Cardiovascular;  Laterality: N/A;   CHOLECYSTECTOMY N/A 07/21/2013   Procedure: LAPAROSCOPIC  CHOLECYSTECTOMY;  Surgeon: Edward Jolly, MD;  Location: [facility];  Service: General;  Laterality: N/A;   COLONOSCOPY W/ POLYPECTOMY     COLONOSCOPY WITH PROPOFOL N/A 08/31/2016   Procedure: COLONOSCOPY WITH PROPOFOL;  Surgeon: Wilford Corner, MD;  Location: [facility] END[facility]COPY;  Service: Endoscopy;  Laterality: N/A;   CORONARY ARTERY BYPASS GRAFT  2006   DENTAL SURGERY     implants   INSERT / REPLACE / REMOVE PACEMAKER  2006, 2012   MDT for sick sinus syndrome, gen change 8/12 by [place]    There were no vitals filed for this visit.   Subjective Assessment - 03/31/21 1023     Subjective Had a fall yesteday - was at the fridge and was reaching to open it/get some orange juice out and fell backwards.    Pertinent History PMH: PD, CAD (s/p CABG 2006), LD, HTN, a fib, pacemaker, GERD, on anticoagulants    Limitations Walking;House hold activities;Standing    Patient Stated Goals wants to improve his balance    Currently in Pain? No/denies  [facility] Adult PT Treatment/Exercise - 03/31/21 1123       Transfers   Transfers Sit to Stand;Stand to Sit    Sit to Stand 5: Supervision    Stand to Sit 5: Supervision    Comments Throughout session with cues to scoot out to edge. When turning to sit back on mat - cues to move RW and then march/march and back up fully to mat before sitting down.      Ambulation/Gait   Ambulation/Gait Yes    Ambulation/Gait Assistance 4: Min guard    Ambulation/Gait Assistance Details Pt with increased festination/freezing episodes today compared to past sessions. Pt needing cues throughout to take BIG steps to stay inside the red tband. During freezing/festination episodes (esp around curves or when approaching other patients) - cued to STOP and reset (pt with difficulty with rocking even with manual cues through hips) before taking a big step to start gait again. Cued for slowed pace     Ambulation Distance (Feet) 230 Feet   x1, 100' x 1   Assistive device Rolling walker    Gait Pattern Step-through pattern;Decreased step length - right;Decreased step length - left    Ambulation Surface Indoor;Level                 Balance Exercises - 03/31/21 1126       Balance Exercises: Standing   SLS with Vectors Solid surface;Upper extremity assist 2;Limitations    SLS with Vectors Limitations Alternating SLS taps to 6" step to target x15 reps each leg - cues for slowed and controlled and foot clearance.    Stepping Strategy Posterior;Lateral;Limitations    Stepping Strategy Limitations Massed practice lateral stepping strategy- x10 reps each side stepping to lateral target (pt with tendency to take a couple steps to get to target vs. one big step), stepping over 2" obstacle for lateral stepping (pt seemed to do better with this for taking one step and foot clearance). Performed x12 reps each side posterior stepping strategy (performed 1 leg at a time), with demo/verbal cues for slowed pace and shifting weight all the way backwards before stepping back to midline.    Other Standing Exercises With BUE support: alternating forward stepping to targets over 2" foam beam for obstacle for incr foot clearance/step height - x15 reps each leg with verbal/demo cues for heel strike when stepping. Then an additional x10 reps each side forward/diagonal stepping to taget, incr difficulty with LLE                  PT Short Term Goals - 03/11/21 1117       PT SHORT TERM GOAL #1   Title Pt will perform initial HEP with supervision from pt's spouse for strength, balance, and transfers in order to build upon functional gains made in therapy. ALL STGS DUE 02/26/21    Time 4    Period Weeks    Status New    Target Date 02/26/21      PT SHORT TERM GOAL #2   Title Pt and pt's spouse will verbalize understanding of fall prevention in the home.    Baseline provided handout on 03/11/21     Time 4    Period Weeks    Status Achieved      PT SHORT TERM GOAL #3   Title Pt and pt's spouse will verbalize understanding of techniques to help with freezing/festination episodes in order to demo improved safety with gait.    Baseline Reviewed today  in session on 02/25/21 - will continue to benefit from additional practice/repetition with wife practicing cues with pt    Time 4    Period Weeks    Status Partially Met      PT SHORT TERM GOAL #4   Title Pt will perform TUG with RW vs. UStep Walker in 21 seconds or less in order to demo decr fall risk.    Baseline 23.75 seconds, 51 seconds first attempt 42 seconds 2nd attempt    Time 4    Period Weeks    Status Not Met      PT SHORT TERM GOAL #5   Title Pt will perform 5 reps of sit <> stand with BUE support with proper technique and without BLE bracing in order to demo improved safety with functional transfers.    Baseline met on 02/25/21    Time 4    Period Weeks    Status Achieved               PT Long Term Goals - 03/26/21 1413       PT LONG TERM GOAL #1   Title Pt will perform final HEP with supervision from pt's spouse for strength, balance, and transfers in order to build upon functional gains made in therapy. ALL LTGS DUE 04/21/21    Baseline 03/26/21 PT continues to add to HEP    Time 12    Period Weeks    Status On-going    Target Date 04/21/21      PT LONG TERM GOAL #2   Title Pt will ambulate >500' with RW supervision on varied level surfaces for improved household and short community distances.    Time 12    Period Weeks    Status Revised    Target Date 04/21/21      PT LONG TERM GOAL #3   Title Pt will be able to perform 5 x sit to stand in <12 sec without hands but controlling descent for improved balance and functional mobility.    Baseline 22.22 seconds, decr eccentric control; 12.14 seconds w/o use of UE, w decr eccentric control 03/26/21    Time 12    Period Weeks    Status Revised    Target Date  04/21/21      PT LONG TERM GOAL #4   Title Pt will perform TUG with RW in <16 sec for improved balance and functional mobility.    Baseline 23.75 seconds with RW, 18.54 seconds with RW 03/26/21    Time 12    Period Weeks    Status Revised    Target Date 04/21/21                   Plan - 03/31/21 1135     Clinical Impression Statement Pt with incr freezing/festination episodes during gait with today's session. Pt's spouse reports incr frequency of these episodes after he was taken off Entacapone medication (from recent appt with Dr. Carles Collet).Pt needing frequent cues to stop and reset with freezing/festination episodes and to take a big step to the red tband with slowed pace. Worked on stepping strategies today with BUE support. Pt challenged by lateral stepping - tendency to take a couple steps back to midline vs. one solid step. However, did do better with use of obstacle to step over. Pt needing cues for posterior stepping with weight shift, did better with one foot at a time vs. alternating. Will continue to progress towards LTGs.  Personal Factors and Comorbidities Comorbidity 3+;Time since onset of injury/illness/exacerbation;Past/Current Experience;Behavior Pattern    Comorbidities PD (diagnosed 2012), CAD (s/p CABG 2006), LD, HTN, a fib, pacemaker, GERD, on anticoagulants    Examination-Activity Limitations Bed Mobility;Caring for Others;Toileting;Transfers;Stairs;Locomotion Level;Stand;Squat;Reach Overhead    Designer, fashion/clothing;Yard Work    Merchant [employer] officer Evolving/Moderate complexity    Rehab Potential Good    PT Frequency 2x / week    PT Duration 12 weeks    PT Treatment/Interventions ADLs/Self Care Home Management;DME Instruction;Therapeutic activities;Functional mobility training;Gait training;Stair training;Therapeutic exercise;Balance training;Neuromuscular re-education;Patient/family  education;Energy conservation;Vestibular    PT Next Visit Plan Continue to work on stepping strategies and add to HEP when appropriate. how was walking program at home? Pt is very hard of hearing even with hearing aids (need to wear clear mask). Continue gait and turning training with RW - working on cues for incr stride length and working on slowed pace, using red tband as big kicks/steps to the red. Pt seemed most challenged when gets distracted. standing foot clearance/stepping strategy activities and weight shifting. Working on turns.    PT Home Exercise Plan [id number]    Consulted and Agree with Plan of Care Patient;Family member/caregiver    Family Member Consulted pt's [name]             Patient will benefit from skilled therapeutic intervention in order to improve the following deficits and impairments:  Abnormal gait, Decreased activity tolerance, Decreased coordination, Decreased balance, Decreased endurance, Decreased knowledge of use of DME, Decreased safety awareness, Decreased mobility, Decreased strength, Difficulty walking, Impaired flexibility, Postural dysfunction  Visit Diagnosis: Other lack of coordination  Other abnormalities of gait and mobility  Abnormal posture  Unsteadiness on feet     Problem List Patient Active Problem List   Diagnosis Date Noted   HOH (hard of hearing) 12/18/2020   Personal history of colonic polyps 08/31/2016   Abnormality of gait 08/27/2014   Memory difficulty 08/27/2014   Atrial fibrillation ([place]) 09/08/2013   Cholelithiases 07/19/2013   Cholelithiasis 07/19/2013   Encounter for therapeutic drug monitoring 07/03/2013   Aortic valve replaced 04/05/2013   Parkinson disease ([place]) 09/27/2012   Orthostatic hypotension 04/01/2012   Pacemaker-Medtronic 03/29/2012   HEPATITIS A 06/04/2010   HYPERTHYROIDISM 06/04/2010   HYPERLIPIDEMIA 06/04/2010   Essential hypertension 06/04/2010   CAD 06/04/2010   AORTIC STENOSIS 06/04/2010    SICK SINUS SYNDROME 06/04/2010   HIATAL HERNIA 06/04/2010   SHORTNESS OF BREATH 06/04/2010   CHEST PAIN 06/04/2010    Arliss Journey, PT, DPT  03/31/2021, 11:38 AM  [facility] [address] [place], [place], [address] Phone: [phone]   Fax:  [phone]  Name: Donald [name] MRN: [id number] Date of Birth: 04-21-1941

## 2021-03-31 NOTE — Patient Instructions (Addendum)
   Loud AH! 10x with a big breath before each one  Read 15 sentences aloud, focusing on good volume and slow speech  When [name] asks you to repeat - take a big breath to power your voice    Fasten these for me  Leida Lauth! How's the [employer]?  How are you Ronaldo Miyamoto? Is Maralyn Sago doing well?  Hi Jenna. How are your classes?  I worked with [employer] for 35 years  Good boy, Billey Gosling  What are we having for dinner  I watch [employer]  Signs of Aspiration Pneumonia   Chest pain/tightness Fever (can be low grade) Cough  With foul-smelling phlegm (sputum) With sputum containing pus or blood With greenish sputum Fatigue  Shortness of breath  Wheezing   **IF YOU HAVE THESE SIGNS, CONTACT YOUR DOCTOR OR GO TO THE EMERGENCY DEPARTMENT OR URGENT CARE AS SOON AS POSSIBLE**

## 2021-03-31 NOTE — Patient Instructions (Signed)
     SHOULDER: Flexion Bilateral    Hold ball with both hands (in palms).  Lean forward and touch ball to the ground.  Raise arms overhead at same speed. Keep elbows straight.  Maintain object in midline.  Watch the ball with your eyes.  Don't move arms faster than eyes.  Make sure you don't lean back as you raise the ball. 20 reps per set, 1 sets per day,

## 2021-03-31 NOTE — Therapy (Signed)
[facility] [address] [place], [place], [address] Phone: [phone]   Fax:  [phone]  Speech Language Pathology Treatment  Patient Details  Name: Donald [name] MRN: [id number] Date of Birth: 1941/01/12 Referring Provider (SLP): Dr. Lurena Joiner Tat   Encounter Date: 03/31/2021   End of Session - 03/31/21 1222     Visit Number 4    Number of Visits 25    Date for SLP Re-Evaluation 05/23/21    SLP Start Time 0845    SLP Stop Time  0927    SLP Time Calculation (min) 42 min    Activity Tolerance Patient tolerated treatment well             Past Medical History:  Diagnosis Date   Abnormality of gait 08/27/2014   Anxiety    Aortic stenosis    s/p AVR by Dr Laneta Simmers   Arthritis    left wrist- probable- not diagonised   Asthma    in the past   CAD (coronary artery disease)    s/p CABG 2006   GERD (gastroesophageal reflux disease)    H/O seasonal allergies    H/O: rheumatic fever    Hearing deficit    Bilateral hearing aids   Hematuria    had CT scan   Hiatal hernia    History of kidney stones    HOH (hard of hearing)    Hyperlipidemia    Hypertension    Hyperthyroidism    Inguinal hernia    right side; watching at this time per wife's report   Memory difficulty 08/27/2014   Pacemaker    Parkinson's disease    Persistent atrial fibrillation (HCC)    Scarlet fever    as a child   Shortness of breath    Sick sinus syndrome (HCC)    s/p PPM (MDT) 2006    Past Surgical History:  Procedure Laterality Date   AORTIC VALVE REPLACEMENT  2006   CARDIOVERSION N/A 09/14/2013   Procedure: CARDIOVERSION;  Surgeon: Thurmon Fair, MD;  Location: [facility] ENDOSCOPY;  Service: Cardiovascular;  Laterality: N/A;   CHOLECYSTECTOMY N/A 07/21/2013   Procedure: LAPAROSCOPIC CHOLECYSTECTOMY;  Surgeon: Mariella Saa, MD;  Location: [facility] OR;  Service: General;  Laterality: N/A;   COLONOSCOPY W/ POLYPECTOMY      COLONOSCOPY WITH PROPOFOL N/A 08/31/2016   Procedure: COLONOSCOPY WITH PROPOFOL;  Surgeon: Charlott Rakes, MD;  Location: [facility] ENDOSCOPY;  Service: Endoscopy;  Laterality: N/A;   CORONARY ARTERY BYPASS GRAFT  2006   DENTAL SURGERY     implants   INSERT / REPLACE / REMOVE PACEMAKER  2006, 2012   MDT for sick sinus syndrome, gen change 8/12 by JA   KIDNEY STONE SURGERY  1984    There were no vitals filed for this visit.   Subjective Assessment - 03/31/21 0846     Subjective "We had a good weekend"    Patient is accompained by: Family member   spouse [name]   Currently in Pain? No/denies                   ADULT SLP TREATMENT - 03/31/21 0852       General Information   Behavior/Cognition Alert;Cooperative;Requires cueing;Hard of hearing      Treatment Provided   Treatment provided Dysphagia;Cognitive-Linquistic      Dysphagia Treatment   Temperature Spikes Noted No    Respiratory Status Room air    Oral Cavity - Dentition Adequate natural dentition  Treatment Methods Skilled observation;Therapeutic exercise;Compensation strategy training;Patient/caregiver education    Patient observed directly with PO's Yes    Type of PO's observed Thin liquids   pills   Feeding Able to feed self    Liquids provided via Cup    Pharyngeal Phase Signs & Symptoms Wet vocal quality;Immediate throat clear;Delayed cough    Type of cueing Verbal;Visual;Tactile    Amount of cueing Moderate    Other treatment/comments HEP for dysphagia with occasional min to mod A. Grill is not achieving falsetto [place] 4/10x. Education re: apsiration pna, use of mirror for [employer]. Donald [name] verbalized swallow precautions with questioning cues. Donald [name] followed swallow precuations with pills, taking 1 at a time and following with an extra sip for each pill. Immedite coughing with 3/3 pills      Cognitive-Linquistic Treatment   Treatment focused on Dysarthria;Patient/family/caregiver education    Skilled  Treatment Inititaed HEP for dysarthria. Convesational speech averaged 65dB (70 is WNL) Loud /a/ to recalibrate volume with cues for breath support and volume averaged 90dB 8/8x. Cues for breath support and volume reading short sentneces aloud averaged 72dB. Generated 7 personally relevant sentences re: family members.      Assessment / Recommendations / Plan   Plan Continue with current plan of care      Progression Toward Goals   Progression toward goals Progressing toward goals              SLP Education - 03/31/21 0932     Education Details loud AH, HEP for dysphagia and dysarthria, swallow precautions, aspiration pna    Person(s) Educated Patient;Spouse    Methods Explanation;Demonstration;Verbal cues;Handout    Comprehension Verbalized understanding;Returned demonstration;Verbal cues required;Need further instruction              SLP Short Term Goals - 03/31/21 1221       SLP SHORT TERM GOAL #1   Title Pt will average 85dB on loud /a/ with occasional min A over 2 sessions.    Time 5    Period Weeks    Status On-going      SLP SHORT TERM GOAL #2   Title Pt will average 68dB on 18/20 sentences with occasional min A    Time 5    Period Weeks    Status On-going      SLP SHORT TERM GOAL #3   Title Pt/spouse will follow 2 diet modificiations with occasional min A over 3 sessions    Time 5    Period Weeks    Status On-going      SLP SHORT TERM GOAL #4   Title Pt will complete HEP for dysphagia with usual min  visual and verbal cues over 2 sessions    Time 5    Period Weeks    Status On-going      SLP SHORT TERM GOAL #5   Title Pt will average 68dB over 5 minute conversation with occasional min A over 2 sessions    Time 5    Period Weeks    Status On-going              SLP Long Term Goals - 03/31/21 1222       SLP LONG TERM GOAL #1   Title Pt will average 85dB on loud /a/ with rare min A over 2 sessions    Time 11    Period Weeks    Status  On-going      SLP LONG TERM GOAL #2   Title  Pt will average 68dB over 12 minute conversation with occasional min A    Time 11    Period Weeks    Status On-going      SLP LONG TERM GOAL #3   Title Pt will complete HEP for dysarthria and dysphagia with occasional min A over 3 sessions    Time 11    Period Weeks    Status On-going      SLP LONG TERM GOAL #4   Title Pt will follow swallow precautions with occasional min A over 3 sessions    Time 11    Period Weeks    Status On-going      SLP LONG TERM GOAL #5   Title Pt/spouse will verbalize 4 s/s of aspiration pna with rare min A    Time 11    Period Weeks    Status On-going              Plan - 03/31/21 0932     Clinical Impression Statement Ongoing training of HEP for dysphagia and swallow precautions. Initiated training in HEP for dysarthria. [name] and [name] report diffculty  communicating due to Donald [name]'s low volume and running his words together. [place] requires mod A to maintain volume in oral reading task. [place] education re: s/s of aspiration pna. Continue skilled ST to maximize intelligilbity and safety fo swallow.    Speech Therapy Frequency 2x / week    Duration 12 weeks    Treatment/Interventions Aspiration precaution training;Language facilitation;Environmental controls;Cueing hierarchy;SLP instruction and feedback;Pharyngeal strengthening exercises;Compensatory techniques;Cognitive reorganization;Functional tasks;Compensatory strategies;Patient/family education;Multimodal communcation approach;Internal/external aids;Trials of upgraded texture/liquids;Diet toleration management by SLP;Other (comment)    Potential to Achieve Goals Fair    Potential Considerations Cooperation/participation level;Previous level of function;Medical prognosis;Severity of impairments             Patient will benefit from skilled therapeutic intervention in order to improve the following deficits and impairments:   Dysphagia,  oropharyngeal phase  Dysarthria and anarthria    Problem List Patient Active Problem List   Diagnosis Date Noted   HOH (hard of hearing) 12/18/2020   Personal history of colonic polyps 08/31/2016   Abnormality of gait 08/27/2014   Memory difficulty 08/27/2014   Atrial fibrillation ([place]) 09/08/2013   Cholelithiases 07/19/2013   Cholelithiasis 07/19/2013   Encounter for therapeutic drug monitoring 07/03/2013   Aortic valve replaced 04/05/2013   Parkinson disease ([place]) 09/27/2012   Orthostatic hypotension 04/01/2012   Pacemaker-Medtronic 03/29/2012   HEPATITIS A 06/04/2010   HYPERTHYROIDISM 06/04/2010   HYPERLIPIDEMIA 06/04/2010   Essential hypertension 06/04/2010   CAD 06/04/2010   AORTIC STENOSIS 06/04/2010   SICK SINUS SYNDROME 06/04/2010   HIATAL HERNIA 06/04/2010   SHORTNESS OF BREATH 06/04/2010   CHEST PAIN 06/04/2010    Tyja Gortney, Annye Rusk MS, CCC-SLP 03/31/2021, 12:23 PM  [facility] [address] [place], [place], [address] Phone: [phone]   Fax:  [phone]   Name: DAMION JAUCH MRN: [id number] Date of Birth: [birth year]

## 2021-03-31 NOTE — Therapy (Signed)
[facility] [address] [place], [place], [address] Phone: [phone]   Fax:  [phone]  Occupational Therapy Treatment  Patient Details  Name: Donald [name] MRN: [id number] Date of Birth: February 27, 1941 Referring Provider (OT): Dr. Lurena Joiner Tat   Encounter Date: 03/31/2021   OT End of Session - 03/31/21 0936     Visit Number 7    Number of Visits 17    Date for OT Re-Evaluation 04/26/21    Authorization Type [employer], follow Medicare guidelines, copay per day    Authorization - Visit Number 7    Authorization - Number of Visits 10    Progress Note Due on Visit 10    OT Start Time 0934    OT Stop Time 1015    OT Time Calculation (min) 41 min    Activity Tolerance Patient tolerated treatment well    Behavior During Therapy [facility] for tasks assessed/performed;Impulsive             Past Medical History:  Diagnosis Date   Abnormality of gait 08/27/2014   Anxiety    Aortic stenosis    s/p AVR by Dr Laneta Simmers   Arthritis    left wrist- probable- not diagonised   Asthma    in the past   CAD (coronary artery disease)    s/p CABG 2006   GERD (gastroesophageal reflux disease)    H/O seasonal allergies    H/O: rheumatic fever    Hearing deficit    Bilateral hearing aids   Hematuria    had CT scan   Hiatal hernia    History of kidney stones    HOH (hard of hearing)    Hyperlipidemia    Hypertension    Hyperthyroidism    Inguinal hernia    right side; watching at this time per wife's report   Memory difficulty 08/27/2014   Pacemaker    Parkinson's disease    Persistent atrial fibrillation (HCC)    Scarlet fever    as a child   Shortness of breath    Sick sinus syndrome (HCC)    s/p PPM (MDT) 2006    Past Surgical History:  Procedure Laterality Date   AORTIC VALVE REPLACEMENT  2006   CARDIOVERSION N/A 09/14/2013   Procedure: CARDIOVERSION;  Surgeon: Thurmon Fair, MD;  Location: [facility]  ENDOSCOPY;  Service: Cardiovascular;  Laterality: N/A;   CHOLECYSTECTOMY N/A 07/21/2013   Procedure: LAPAROSCOPIC CHOLECYSTECTOMY;  Surgeon: Mariella Saa, MD;  Location: [facility] OR;  Service: General;  Laterality: N/A;   COLONOSCOPY W/ POLYPECTOMY     COLONOSCOPY WITH PROPOFOL N/A 08/31/2016   Procedure: COLONOSCOPY WITH PROPOFOL;  Surgeon: Charlott Rakes, MD;  Location: [facility] ENDOSCOPY;  Service: Endoscopy;  Laterality: N/A;   CORONARY ARTERY BYPASS GRAFT  2006   DENTAL SURGERY     implants   INSERT / REPLACE / REMOVE PACEMAKER  2006, 2012   MDT for sick sinus syndrome, gen change 8/12 by JA   KIDNEY STONE SURGERY  1984    There were no vitals filed for this visit.   Subjective Assessment - 03/31/21 0935     Subjective  Denies pain.  1 fall yesterday while getting oj out of refridgerator.    Pertinent History Parkinson's Disease.   PMH:  hx of falls (wears helment), arthritis, asthma, CAD, GERD, HOH, HLD, HTN, hyperthyroidism, anxiety, aortic stenosis, hx of aortic valve replacement 2006, memory difficulty, pacemaker, persistent a-fib, hx of scarlet fever, shortness of breath,  hx of CABG 2006, cholecystectomy 2015    Limitations fall risk (wears helment and pads), freezing, hard of hearing--reads lips (**wear clear mask)    Patient Stated Goals improve coordination    Currently in Pain? No/denies              Flipping cards with each hand with min-mod cueing (visual, verbal, tactile) for large amplitude movements and timing (finger ext/thumb abduction, supination).  Pt festinates with picking up cards.  Standing, forward/backward stepping to targets with each LE with holding/use of RW with CGA and mod verbal and tactile cueing for incr wt. Shift and large amplitude step.  Then added stepping forward/back with functional reach to grasp/release cylinder object with focus on breaking down task/timing (step forward first, then reach/grasp with PWR! hand, then step back then reach/release)  with mod cueing (tactile and verbal) to facilitate large amplitude movements, wt. Shift, and timing of movement.  (All to simulate conditions for reaching in fridge due to recent fall).  Functional mobility (ambulation, sit>stand, and turns) with RW within session with min-mod cueing (verbal, visual, tactile) for large amplitude movement strategies, timing, and stop if festination and restart big.      OT Education - 03/31/21 1047     Education Details Added PWR! up in sitting (with emphasis on avoiding hyperext of trunk, forward lean, and slow movements) and added shoulder flex with BUEs with ball/paper towel roll/shoe box (floor>overhead with emphasis on eye movements and timing, avoid hyperext of trunk)--see pt instructions    Person(s) Educated Patient;Spouse    Methods Explanation;Demonstration;Handout;Verbal cues;Tactile cues    Comprehension Verbalized understanding;Returned demonstration;Verbal cues required;Tactile cues required;Need further instruction              OT Short Term Goals - 03/31/21 1240       OT SHORT TERM GOAL #1   Title Pt will perform PD-specific HEP with cueing prn from caregiver--check STGs 03/27/21    Time 4    Period Weeks    Status On-going      OT SHORT TERM GOAL #2   Title Pt will improve R hand coordination for ADLs (buttoning/tying shoes) as shown by completing 9-hole peg test in 75sec or less.    Baseline 88.31sec    Time 4    Period Weeks    Status New      OT SHORT TERM GOAL #3   Title Pt will improve L hand coordination for ADLs (buttoning/tying shoes) as shown by completing 9-hole peg test in 90 sec or less.    Baseline 108.94sec    Time 4    Period Weeks    Status New      OT SHORT TERM GOAL #4   Title Pt/caregiver will verbalize understanding of ways to decr risk of future complcations related to PD (including falls).    Time 4    Period Weeks    Status New               OT Long Term Goals - 02/25/21 2128       OT  LONG TERM GOAL #1   Title Pt/caregiver will verbalize understanding of AE/strategies to incr ease, safety, and independence with ADLs/IADLs.--check LTGs 04/26/21    Time 8    Period Weeks    Status New      OT LONG TERM GOAL #2   Title Pt will improve R hand coordination for ADLs (buttoning/tying shoes) as shown by completing 9-hole peg test in 65sec or  less.    Time 8    Period Weeks    Status New      OT LONG TERM GOAL #3   Title Pt will improve L hand coordination for ADLs (buttoning/tying shoes) as shown by completing 9-hole peg test in 75 sec or less.    Time 8    Period Weeks    Status New      OT LONG TERM GOAL #4   Title Pt will improve functional reaching/coordination for ADLs as shown by improving score on box and blocks test by at least 5 with dominant LUE.    Baseline 32 blocks    Time 8    Period Weeks    Status New      OT LONG TERM GOAL #5   Title Pt/caregiver will verbalize understanding of appropriate community resources.    Time 8    Period Weeks    Status New                   Plan - 03/31/21 1044     Clinical Impression Statement Pt is progressing slowly due to severity of deficits. Pt requires cueing for timing and amplitude and benefits from demo and tactile cueing with verbal cueing.  However, pt demo improvement overall with repetition and cueing for timing and amplitude.    OT Occupational Profile and History Detailed Assessment- Review of Records and additional review of physical, cognitive, psychosocial history related to current functional performance    Occupational performance deficits (Please refer to evaluation for details): ADL's;IADL's;Leisure    Body Structure / Function / Physical Skills Balance;ADL;Decreased knowledge of use of DME;UE functional use;IADL;Improper spinal/pelvic alignment;Vision;Mobility;Coordination;Decreased knowledge of precautions;FMC    Rehab Potential Good    Clinical Decision Making Several treatment options,  min-mod task modification necessary    Comorbidities Affecting Occupational Performance: May have comorbidities impacting occupational performance    Modification or Assistance to Complete Evaluation  Min-Moderate modification of tasks or assist with assess necessary to complete eval    OT Frequency 2x / week    OT Duration 8 weeks   +eval   OT Treatment/Interventions Self-care/ADL training;Moist Heat;DME and/or AE instruction;Therapeutic activities;Therapeutic exercise;Cognitive remediation/compensation;Visual/perceptual remediation/compensation;Passive range of motion;Functional Mobility Training;Neuromuscular education;Cryotherapy;Energy conservation;Manual Therapy;Patient/family education    Plan check STGs; educate on ways to decr falls/future complications; reinforce functional mobility strategies as appropriate and timing of movement with large amplitude    Consulted and Agree with Plan of Care Patient;Family member/caregiver    Family Member Consulted wife             Patient will benefit from skilled therapeutic intervention in order to improve the following deficits and impairments:   Body Structure / Function / Physical Skills: Balance, ADL, Decreased knowledge of use of DME, UE functional use, IADL, Improper spinal/pelvic alignment, Vision, Mobility, Coordination, Decreased knowledge of precautions, [place]       Visit Diagnosis: Other lack of coordination  Visuospatial deficit  Tremor  Other abnormalities of gait and mobility  Unsteadiness on feet  Abnormal posture  Other symptoms and signs involving the nervous system    Problem List Patient Active Problem List   Diagnosis Date Noted   HOH (hard of hearing) 12/18/2020   Personal history of colonic polyps 08/31/2016   Abnormality of gait 08/27/2014   Memory difficulty 08/27/2014   Atrial fibrillation ([place]) 09/08/2013   Cholelithiases 07/19/2013   Cholelithiasis 07/19/2013   Encounter for therapeutic drug  monitoring 07/03/2013   Aortic valve replaced  04/05/2013   Parkinson disease ([place]) 09/27/2012   Orthostatic hypotension 04/01/2012   Pacemaker-Medtronic 03/29/2012   HEPATITIS A 06/04/2010   HYPERTHYROIDISM 06/04/2010   HYPERLIPIDEMIA 06/04/2010   Essential hypertension 06/04/2010   CAD 06/04/2010   AORTIC STENOSIS 06/04/2010   SICK SINUS SYNDROME 06/04/2010   HIATAL HERNIA 06/04/2010   SHORTNESS OF BREATH 06/04/2010   CHEST PAIN 06/04/2010    Syann Cupples, OT/L 03/31/2021, 1:07 PM  [facility] [address] [place], [place], [address] Phone: [phone]   Fax:  [phone]  Name: Donald [name] MRN: [id number] Date of Birth: 1940-06-09   Vianne Bulls, OTR/L [facility] [address] [place], [place]  [address] [phone] phone [phone] 03/31/21 1:07 PM

## 2021-04-02 ENCOUNTER — Ambulatory Visit: Payer: PPO | Admitting: Speech Pathology

## 2021-04-02 ENCOUNTER — Encounter: Payer: Self-pay | Admitting: Occupational Therapy

## 2021-04-02 ENCOUNTER — Ambulatory Visit: Payer: PPO | Admitting: Physical Therapy

## 2021-04-02 ENCOUNTER — Encounter: Payer: Self-pay | Admitting: Speech Pathology

## 2021-04-02 ENCOUNTER — Other Ambulatory Visit: Payer: Self-pay

## 2021-04-02 ENCOUNTER — Ambulatory Visit: Payer: PPO | Admitting: Occupational Therapy

## 2021-04-02 DIAGNOSIS — Z9181 History of falling: Secondary | ICD-10-CM | POA: Diagnosis not present

## 2021-04-02 DIAGNOSIS — R2689 Other abnormalities of gait and mobility: Secondary | ICD-10-CM

## 2021-04-02 DIAGNOSIS — R471 Dysarthria and anarthria: Secondary | ICD-10-CM

## 2021-04-02 DIAGNOSIS — R2681 Unsteadiness on feet: Secondary | ICD-10-CM | POA: Diagnosis not present

## 2021-04-02 DIAGNOSIS — M6281 Muscle weakness (generalized): Secondary | ICD-10-CM | POA: Diagnosis not present

## 2021-04-02 DIAGNOSIS — R1312 Dysphagia, oropharyngeal phase: Secondary | ICD-10-CM | POA: Diagnosis not present

## 2021-04-02 DIAGNOSIS — R251 Tremor, unspecified: Secondary | ICD-10-CM | POA: Diagnosis not present

## 2021-04-02 DIAGNOSIS — R41842 Visuospatial deficit: Secondary | ICD-10-CM | POA: Diagnosis not present

## 2021-04-02 DIAGNOSIS — R278 Other lack of coordination: Secondary | ICD-10-CM

## 2021-04-02 DIAGNOSIS — R293 Abnormal posture: Secondary | ICD-10-CM | POA: Diagnosis not present

## 2021-04-02 DIAGNOSIS — R29818 Other symptoms and signs involving the nervous system: Secondary | ICD-10-CM

## 2021-04-02 DIAGNOSIS — R1310 Dysphagia, unspecified: Secondary | ICD-10-CM | POA: Diagnosis not present

## 2021-04-02 NOTE — Therapy (Signed)
[facility] [address] [place], [place], [address] Phone: [phone]   Fax:  [phone]  Speech Language Pathology Treatment  Patient Details  Name: Donald [name] MRN: [id number] Date of Birth: 19-Oct-1940 Referring Provider (SLP): Dr. Wells Guiles Tat   Encounter Date: 04/02/2021   End of Session - 04/02/21 1021     Visit Number 5    Number of Visits 25    Date for SLP Re-Evaluation 05/23/21    SLP Start Time 0845    SLP Stop Time  0928    SLP Time Calculation (min) 43 min             Past Medical History:  Diagnosis Date   Abnormality of gait 08/27/2014   Anxiety    Aortic stenosis    s/p AVR by Dr Cyndia Bent   Arthritis    left wrist- probable- not diagonised   Asthma    in the past   CAD (coronary artery disease)    s/p CABG 2006   GERD (gastroesophageal reflux disease)    H/O seasonal allergies    H/O: rheumatic fever    Hearing deficit    Bilateral hearing aids   Hematuria    had CT scan   Hiatal hernia    History of kidney stones    HOH (hard of hearing)    Hyperlipidemia    Hypertension    Hyperthyroidism    Inguinal hernia    right side; watching at this time per wife's report   Memory difficulty 08/27/2014   Pacemaker    Parkinson's disease    Persistent atrial fibrillation ([place])    Scarlet fever    as a child   Shortness of breath    Sick sinus syndrome ([place])    s/p PPM (MDT) 2006    Past Surgical History:  Procedure Laterality Date   AORTIC VALVE REPLACEMENT  2006   CARDIOVERSION N/A 09/14/2013   Procedure: CARDIOVERSION;  Surgeon: Sanda Klein, MD;  Location: [facility];  Service: Cardiovascular;  Laterality: N/A;   CHOLECYSTECTOMY N/A 07/21/2013   Procedure: LAPAROSCOPIC CHOLECYSTECTOMY;  Surgeon: Edward Jolly, MD;  Location: [facility];  Service: General;  Laterality: N/A;   COLONOSCOPY W/ POLYPECTOMY     COLONOSCOPY WITH PROPOFOL N/A 08/31/2016   Procedure: COLONOSCOPY  WITH PROPOFOL;  Surgeon: Wilford Corner, MD;  Location: [facility] ENDOSCOPY;  Service: Endoscopy;  Laterality: N/A;   CORONARY ARTERY BYPASS GRAFT  2006   DENTAL SURGERY     implants   INSERT / REPLACE / REMOVE PACEMAKER  2006, 2012   MDT for sick sinus syndrome, gen change 8/12 by [place]    There were no vitals filed for this visit.   Subjective Assessment - 04/02/21 0845     Subjective "we are ok"    Patient is accompained by: Family member   spouse, Donald [name]   Currently in Pain? No/denies                   ADULT SLP TREATMENT - 04/02/21 0850       General Information   Behavior/Cognition Alert;Cooperative;Requires cueing;Hard of hearing      Treatment Provided   Treatment provided Dysphagia;Cognitive-Linquistic      Dysphagia Treatment   Oral Cavity - Dentition Adequate natural dentition    Treatment Methods Skilled observation;Therapeutic exercise;Compensation strategy training;Patient/caregiver education    Patient observed directly with PO's Yes    Type of PO's  observed Thin liquids    Feeding Able to feed self    Liquids provided via Cup    Pharyngeal Phase Signs & Symptoms Wet vocal quality;Immediate throat clear;Immediate cough;Delayed cough    Type of cueing Verbal;Visual    Amount of cueing Moderate    Other treatment/comments PO trials of soft solid and thin liquid, Donald [name]  followed swallow precautions of using a hard swallow and alternating solids and liquids using a hard swallow 100% after initial cue for hard swallow.  HEP for dysphagia with usual mod A, masako 5/10, hard swallow 10/10, tongue press 10/10, tongue stretch 20/20, falsetto not  achieved with max A, CTAR with frequent mod A 6/10x.      Cognitive-Linquistic Treatment   Treatment focused on Dysarthria;Patient/family/caregiver education    Skilled Treatment Volume recalibrated with loud AH! with average of 90dB with occasional mod A for breath supoort and volume.  In  structured task naming 4 objects (photo) and explaining odd one out, Grounds required frequent mod A, visual cues and verbal cues to achieve 70dB 7/10 cards, and average 68dB on sentence expalnatoin 6/10x In simple conversation, visual cues for volume of 68dB      Dysphagia Recommendations   Diet recommendations Thin liquid                SLP Short Term Goals - 04/02/21 1020       SLP SHORT TERM GOAL #1   Title Pt will average 85dB on loud /a/ with occasional min A over 2 sessions.    Time 5    Period Weeks    Status On-going      SLP SHORT TERM GOAL #2   Title Pt will average 68dB on 18/20 sentences with occasional min A    Time 5    Period Weeks    Status On-going      SLP SHORT TERM GOAL #3   Title Pt/spouse will follow 2 diet modificiations with occasional min A over 3 sessions    Time 5    Period Weeks    Status On-going      SLP SHORT TERM GOAL #4   Title Pt will complete HEP for dysphagia with usual min  visual and verbal cues over 2 sessions    Time 5    Period Weeks    Status On-going      SLP SHORT TERM GOAL #5   Title Pt will average 68dB over 5 minute conversation with occasional min A over 2 sessions    Time 5    Period Weeks    Status On-going              SLP Long Term Goals - 04/02/21 1021       SLP LONG TERM GOAL #1   Title Pt will average 85dB on loud /a/ with rare min A over 2 sessions    Time 11    Period Weeks    Status On-going      SLP LONG TERM GOAL #2   Title Pt will average 68dB over 12 minute conversation with occasional min A    Time 11    Period Weeks    Status On-going      SLP LONG TERM GOAL #3   Title Pt will complete HEP for dysarthria and dysphagia with occasional min A over 3 sessions    Time 11    Period Weeks    Status On-going      SLP LONG TERM  GOAL #4   Title Pt will follow swallow precautions with occasional min A over 3 sessions    Time 11    Period Weeks    Status On-going      SLP LONG TERM GOAL  #5   Title Pt/spouse will verbalize 4 s/s of aspiration pna with rare min A    Time 11    Period Weeks    Status On-going              Plan - 04/02/21 1020     Clinical Impression Statement Ongoing training of HEP for dysphagia and swallow precautions. Initiated training in HEP for dysarthria. Donald [name] and Donald [name] report diffculty  communicating due to Donald [name]'s low volume and running his words together. Donald [name] requires mod A to maintain volume in oral reading task. Donald [name] education re: s/s of aspiration pna. Continue skilled ST to maximize intelligilbity and safety fo swallow.    Speech Therapy Frequency 2x / week    Duration 12 weeks    Treatment/Interventions Aspiration precaution training;Language facilitation;Environmental controls;Cueing hierarchy;SLP instruction and feedback;Pharyngeal strengthening exercises;Compensatory techniques;Cognitive reorganization;Functional tasks;Compensatory strategies;Patient/family education;Multimodal communcation approach;Internal/external aids;Trials of upgraded texture/liquids;Diet toleration management by SLP;Other (comment)    Potential to Achieve Goals Fair    Potential Considerations Medical prognosis;Severity of impairments;Previous level of function             Patient will benefit from skilled therapeutic intervention in order to improve the following deficits and impairments:   Dysphagia, oropharyngeal phase  Dysarthria and anarthria    Problem List Patient Active Problem List   Diagnosis Date Noted   HOH (hard of hearing) 12/18/2020   Personal history of colonic polyps 08/31/2016   Abnormality of gait 08/27/2014   Memory difficulty 08/27/2014   Atrial fibrillation ([place]) 09/08/2013   Cholelithiases 07/19/2013   Cholelithiasis 07/19/2013   Encounter for therapeutic drug monitoring 07/03/2013   Aortic valve replaced 04/05/2013   Parkinson disease ([place]) 09/27/2012   Orthostatic hypotension 04/01/2012   Pacemaker-Medtronic  03/29/2012   HEPATITIS A 06/04/2010   HYPERTHYROIDISM 06/04/2010   HYPERLIPIDEMIA 06/04/2010   Essential hypertension 06/04/2010   CAD 06/04/2010   AORTIC STENOSIS 06/04/2010   SICK SINUS SYNDROME 06/04/2010   HIATAL HERNIA 06/04/2010   SHORTNESS OF BREATH 06/04/2010   CHEST PAIN 06/04/2010    Donald [name], Annye Rusk MS, CCC-SLP 04/02/2021, 10:21 AM  Donald [name] [address] [place], [place], [address] Phone: [phone]   Fax:  [phone]   Name: PARV RASHID MRN: [id number] Date of Birth: 12-Mar-1941

## 2021-04-02 NOTE — Therapy (Addendum)
[facility] [address] [place], [place], [address] Phone: [phone]   Fax:  [phone]  Physical Therapy Treatment  Patient Details  Name: Donald [name] MRN: [id number] Date of Birth: [birth year] Referring Provider (PT): Dr. Carles Collet   Encounter Date: 04/02/2021   PT End of Session - 04/02/21 1023     Visit Number 13    Number of Visits 17    Date for PT Re-Evaluation 04/29/21    Authorization Type HTA - $30 co pay    PT Start Time 1018    PT Stop Time 1100    PT Time Calculation (min) 42 min    Equipment Utilized During Treatment Gait belt    Activity Tolerance Patient tolerated treatment well    Behavior During Therapy [facility] for tasks assessed/performed;Impulsive             Past Medical History:  Diagnosis Date   Abnormality of gait 08/27/2014   Anxiety    Aortic stenosis    s/p AVR by Dr Cyndia Bent   Arthritis    left wrist- probable- not diagonised   Asthma    in the past   CAD (coronary artery disease)    s/p CABG 2006   GERD (gastroesophageal reflux disease)    H/O seasonal allergies    H/O: rheumatic fever    Hearing deficit    Bilateral hearing aids   Hematuria    had CT scan   Hiatal hernia    History of kidney stones    HOH (hard of hearing)    Hyperlipidemia    Hypertension    Hyperthyroidism    Inguinal hernia    right side; watching at this time per wife's report   Memory difficulty 08/27/2014   Pacemaker    Parkinson's disease    Persistent atrial fibrillation ([place])    Scarlet fever    as a child   Shortness of breath    Sick sinus syndrome ([place])    s/p PPM (MDT) 2006    Past Surgical History:  Procedure Laterality Date   AORTIC VALVE REPLACEMENT  2006   CARDIOVERSION N/A 09/14/2013   Procedure: CARDIOVERSION;  Surgeon: Sanda Klein, MD;  Location: [facility];  Service: Cardiovascular;  Laterality: N/A;   CHOLECYSTECTOMY N/A 07/21/2013   Procedure: LAPAROSCOPIC  CHOLECYSTECTOMY;  Surgeon: Edward Jolly, MD;  Location: [facility];  Service: General;  Laterality: N/A;   COLONOSCOPY W/ POLYPECTOMY     COLONOSCOPY WITH PROPOFOL N/A 08/31/2016   Procedure: COLONOSCOPY WITH PROPOFOL;  Surgeon: Wilford Corner, MD;  Location: [facility] ENDOSCOPY;  Service: Endoscopy;  Laterality: N/A;   CORONARY ARTERY BYPASS GRAFT  2006   DENTAL SURGERY     implants   INSERT / REPLACE / REMOVE PACEMAKER  2006, 2012   MDT for sick sinus syndrome, gen change 8/12 by [place]    There were no vitals filed for this visit.   Subjective Assessment - 04/02/21 1023     Subjective No changes, no falls.    Pertinent History PMH: PD, CAD (s/p CABG 2006), LD, HTN, a fib, pacemaker, GERD, on anticoagulants    Limitations Walking;House hold activities;Standing    Patient Stated Goals wants to improve his balance    Currently in Pain? No/denies                               [facility]  Adult PT Treatment/Exercise - 04/02/21 1055       Transfers   Transfers Sit to Stand;Stand to Sit    Sit to Stand 5: Supervision    Stand to Sit 5: Supervision    Comments x10 reps with cues for scooting towards edge, wider BOS, and incr forward lean to stand. Discussed pressing up from mat table vs. crossing his arms as when he does this he doesn't get his full weight shift forwards at times.      Ambulation/Gait   Ambulation/Gait Yes    Ambulation/Gait Assistance 4: Min guard    Ambulation/Gait Assistance Details Trialed a different strategy of cueing for patient today if there is a day where stepping BIG to the red in his RW doesn't work as well. Cued for patient to count his steps to take as few steps as possible (make them BIG). Trialed x2 laps with pt needing cues for technique and needing to stop and reset to continue with bigger steps. Initially when pt would try counting he would take bigger steps then revert to more shuffled steps.  Pt would need more  practice with this. Remainder of gait throughout session focused on taking BIG steps to the red tband and staying inside BOS of RW. Pt needing cues  when festinating on his toes to STOP and get feet flat on the floor, stand tall and then continue by taking bigger steps. Pt demonstrates more festination episodes ambulating in a more busier therapy gym.    Ambulation Distance (Feet) 115 Feet   x3, plus additional distances   Assistive device Rolling walker    Gait Pattern Step-through pattern;Decreased step length - right;Decreased step length - left    Ambulation Surface Level;Indoor                 Balance Exercises - 04/02/21 1232       Balance Exercises: Standing   Stepping Strategy Posterior;Limitations    Stepping Strategy Limitations At countertop with use BUE support at RW: x15 reps each side, with manual/verbal cues for technique and weight shift - cues to get foot fully down on floor vs. just stepping back on toes. Then performed with single UE support on RW - grabbing bean bag in front/superior in front of patient and then taking a step posteriorly/laterally to place on table. Cues for incr step length esp with LLE, performed x8 reps both sides.                  PT Short Term Goals - 03/11/21 1117       PT SHORT TERM GOAL #1   Title Pt will perform initial HEP with supervision from pt's spouse for strength, balance, and transfers in order to build upon functional gains made in therapy. ALL STGS DUE 02/26/21    Time 4    Period Weeks    Status New    Target Date 02/26/21      PT SHORT TERM GOAL #2   Title Pt and pt's spouse will verbalize understanding of fall prevention in the home.    Baseline provided handout on 03/11/21    Time 4    Period Weeks    Status Achieved      PT SHORT TERM GOAL #3   Title Pt and pt's spouse will verbalize understanding of techniques to help with freezing/festination episodes in order to demo improved safety with gait.     Baseline Reviewed today in session on 02/25/21 - will continue to  benefit from additional practice/repetition with wife practicing cues with pt    Time 4    Period Weeks    Status Partially Met      PT SHORT TERM GOAL #4   Title Pt will perform TUG with RW vs. UStep Walker in 21 seconds or less in order to demo decr fall risk.    Baseline 23.75 seconds, 51 seconds first attempt 42 seconds 2nd attempt    Time 4    Period Weeks    Status Not Met      PT SHORT TERM GOAL #5   Title Pt will perform 5 reps of sit <> stand with BUE support with proper technique and without BLE bracing in order to demo improved safety with functional transfers.    Baseline met on 02/25/21    Time 4    Period Weeks    Status Achieved               PT Long Term Goals - 03/26/21 1413       PT LONG TERM GOAL #1   Title Pt will perform final HEP with supervision from pt's spouse for strength, balance, and transfers in order to build upon functional gains made in therapy. ALL LTGS DUE 04/21/21    Baseline 03/26/21 PT continues to add to HEP    Time 12    Period Weeks    Status On-going    Target Date 04/21/21      PT LONG TERM GOAL #2   Title Pt will ambulate >500' with RW supervision on varied level surfaces for improved household and short community distances.    Time 12    Period Weeks    Status Revised    Target Date 04/21/21      PT LONG TERM GOAL #3   Title Pt will be able to perform 5 x sit to stand in <12 sec without hands but controlling descent for improved balance and functional mobility.    Baseline 22.22 seconds, decr eccentric control; 12.14 seconds w/o use of UE, w decr eccentric control 03/26/21    Time 12    Period Weeks    Status Revised    Target Date 04/21/21      PT LONG TERM GOAL #4   Title Pt will perform TUG with RW in <16 sec for improved balance and functional mobility.    Baseline 23.75 seconds with RW, 18.54 seconds with RW 03/26/21    Time 12    Period Weeks     Status Revised    Target Date 04/21/21                   Plan - 04/02/21 1243     Clinical Impression Statement Trialed a different method of cueing today with gait with working on pt counting his steps to try to take as few steps as possible to get to a desination. Pt initially demonstrated incr step length when counting, but then reverted back to smaller steps. Remainder of session focused on big steps to the red with pt demonstrating improvements in step length today compared to the last session. Also worked on stepping strategies in the posterior direction with UE support, with manual/verbal cues for step length (esp with LLE) and weight shifting. Pt will need more practice with stepping strategies before adding for home.    Personal Factors and Comorbidities Comorbidity 3+;Time since onset of injury/illness/exacerbation;Past/Current Experience;Behavior Pattern    Comorbidities PD (diagnosed 2012), CAD (s/p  CABG 2006), LD, HTN, a fib, pacemaker, GERD, on anticoagulants    Examination-Activity Limitations Bed Mobility;Caring for Others;Toileting;Transfers;Stairs;Locomotion Level;Stand;Squat;Reach Overhead    Designer, fashion/clothing;Yard Work    Merchant [employer] officer Evolving/Moderate complexity    Rehab Potential Good    PT Frequency 2x / week    PT Duration 12 weeks    PT Treatment/Interventions ADLs/Self Care Home Management;DME Instruction;Therapeutic activities;Functional mobility training;Gait training;Stair training;Therapeutic exercise;Balance training;Neuromuscular re-education;Patient/family education;Energy conservation;Vestibular    PT Next Visit Plan Continue to work on stepping strategies and add to HEP when appropriate. Pt is very hard of hearing even with hearing aids (need to wear clear mask). Continue gait and turning training with RW - working on cues for incr stride length and working on slowed  pace, using red tband as big kicks/steps to the red. or try counting steps again vs. use of laser for gait?  standing foot clearance and weight shifting. Working on turns. try seated PWR?    PT Home Exercise Plan [email or web address]    Consulted and Agree with Plan of Care Patient;Family member/caregiver    Family Member Consulted pt's [name]             Patient will benefit from skilled therapeutic intervention in order to improve the following deficits and impairments:  Abnormal gait, Decreased activity tolerance, Decreased coordination, Decreased balance, Decreased endurance, Decreased knowledge of use of DME, Decreased safety awareness, Decreased mobility, Decreased strength, Difficulty walking, Impaired flexibility, Postural dysfunction  Visit Diagnosis: Other abnormalities of gait and mobility  Abnormal posture  Other lack of coordination     Problem List Patient Active Problem List   Diagnosis Date Noted   HOH (hard of hearing) 12/18/2020   Personal history of colonic polyps 08/31/2016   Abnormality of gait 08/27/2014   Memory difficulty 08/27/2014   Atrial fibrillation ([place]) 09/08/2013   Cholelithiases 07/19/2013   Cholelithiasis 07/19/2013   Encounter for therapeutic drug monitoring 07/03/2013   Aortic valve replaced 04/05/2013   Parkinson disease ([place]) 09/27/2012   Orthostatic hypotension 04/01/2012   Pacemaker-Medtronic 03/29/2012   HEPATITIS A 06/04/2010   HYPERTHYROIDISM 06/04/2010   HYPERLIPIDEMIA 06/04/2010   Essential hypertension 06/04/2010   CAD 06/04/2010   AORTIC STENOSIS 06/04/2010   SICK SINUS SYNDROME 06/04/2010   HIATAL HERNIA 06/04/2010   SHORTNESS OF BREATH 06/04/2010   CHEST PAIN 06/04/2010    Arliss Journey, PT,DPT  04/02/2021, 12:46 PM  [facility] [address] [place], [place], [address] Phone: [phone]   Fax:  [phone]  Name: RIGEL FILSINGER MRN: [id number] Date  of Birth: [birth year]

## 2021-04-02 NOTE — Therapy (Signed)
[place] [address] [place], [place], [address] Phone: [phone]   Fax:  [phone]  Occupational Therapy Treatment  Patient Details  Name: Donald [name] MRN: [id number] Date of Birth: [birth year] Referring Provider (OT): Dr. Wells Guiles Tat   Encounter Date: 04/02/2021   OT End of Session - 04/02/21 1209     Visit Number 8    Number of Visits 17    Date for OT Re-Evaluation 04/26/21    Authorization Type [employer], follow Medicare guidelines, copay per day    Authorization - Visit Number 8    Authorization - Number of Visits 10    Progress Note Due on Visit 10    OT Start Time 0934    OT Stop Time 1015    OT Time Calculation (min) 41 min    Activity Tolerance Patient tolerated treatment well    Behavior During Therapy [facility] for tasks assessed/performed;Impulsive             Past Medical History:  Diagnosis Date   Abnormality of gait 08/27/2014   Anxiety    Aortic stenosis    s/p AVR by Dr Cyndia Bent   Arthritis    left wrist- probable- not diagonised   Asthma    in the past   CAD (coronary artery disease)    s/p CABG 2006   GERD (gastroesophageal reflux disease)    H/O seasonal allergies    H/O: rheumatic fever    Hearing deficit    Bilateral hearing aids   Hematuria    had CT scan   Hiatal hernia    History of kidney stones    HOH (hard of hearing)    Hyperlipidemia    Hypertension    Hyperthyroidism    Inguinal hernia    right side; watching at this time per wife's report   Memory difficulty 08/27/2014   Pacemaker    Parkinson's disease    Persistent atrial fibrillation ([place])    Scarlet fever    as a child   Shortness of breath    Sick sinus syndrome ([place])    s/p PPM (MDT) 2006    Past Surgical History:  Procedure Laterality Date   AORTIC VALVE REPLACEMENT  2006   CARDIOVERSION N/A 09/14/2013   Procedure: CARDIOVERSION;  Surgeon: Sanda Klein, MD;  Location: [place]  ENDOSCOPY;  Service: Cardiovascular;  Laterality: N/A;   CHOLECYSTECTOMY N/A 07/21/2013   Procedure: LAPAROSCOPIC CHOLECYSTECTOMY;  Surgeon: Edward Jolly, MD;  Location: [facility];  Service: General;  Laterality: N/A;   COLONOSCOPY W/ POLYPECTOMY     COLONOSCOPY WITH PROPOFOL N/A 08/31/2016   Procedure: COLONOSCOPY WITH PROPOFOL;  Surgeon: Wilford Corner, MD;  Location: [facility] ENDOSCOPY;  Service: Endoscopy;  Laterality: N/A;   CORONARY ARTERY BYPASS GRAFT  2006   DENTAL SURGERY     implants   INSERT / REPLACE / REMOVE PACEMAKER  2006, 2012   MDT for sick sinus syndrome, gen change 8/12 by [place]    There were no vitals filed for this visit.        Pain: Denies pain   Treatment: Standing out the counter, with RW , stepping backwards to targets with alternating feet, in prep for safety during ADLs, mod verbal and tactile cues to slow down, for timing  and to keep feet apart. Then functional reaching into cabinets with feet spread and staggered stance, mod verbal and tactile cues for posture, positioning, and open  hand in prep for reach / grasp no LOB  Functional reaching in seated  with left and right UE's to place large to small pegs in pegboard , min v.c for amplitude and posture.             OT Education - 04/02/21 1216     Education Details Reviewed  PWR! up in sitting (with emphasis on avoiding hyperext of trunk, forward lean, and slow movements) and  shoulder flex with BUEs with ball/paper towel roll/shoe box (floor>overhead with emphasis on eye movements and timing, avoid hyperext of trunk)min-mod v.c    Person(s) Educated Patient;Spouse    Methods Explanation;Demonstration;Handout;Verbal cues    Comprehension Verbalized understanding;Returned demonstration;Verbal cues required;Tactile cues required              OT Short Term Goals - 03/31/21 1240       OT SHORT TERM GOAL #1   Title Pt will perform PD-specific HEP with cueing prn  from caregiver--check STGs 03/27/21    Time 4    Period Weeks    Status On-going      OT SHORT TERM GOAL #2   Title Pt will improve R hand coordination for ADLs (buttoning/tying shoes) as shown by completing 9-hole peg test in 75sec or less.    Baseline 88.31sec    Time 4    Period Weeks    Status New      OT SHORT TERM GOAL #3   Title Pt will improve L hand coordination for ADLs (buttoning/tying shoes) as shown by completing 9-hole peg test in 90 sec or less.    Baseline 108.94sec    Time 4    Period Weeks    Status New      OT SHORT TERM GOAL #4   Title Pt/caregiver will verbalize understanding of ways to decr risk of future complcations related to PD (including falls).    Time 4    Period Weeks    Status New               OT Long Term Goals - 02/25/21 2128       OT LONG TERM GOAL #1   Title Pt/caregiver will verbalize understanding of AE/strategies to incr ease, safety, and independence with ADLs/IADLs.--check LTGs 04/26/21    Time 8    Period Weeks    Status New      OT LONG TERM GOAL #2   Title Pt will improve R hand coordination for ADLs (buttoning/tying shoes) as shown by completing 9-hole peg test in 65sec or less.    Time 8    Period Weeks    Status New      OT LONG TERM GOAL #3   Title Pt will improve L hand coordination for ADLs (buttoning/tying shoes) as shown by completing 9-hole peg test in 75 sec or less.    Time 8    Period Weeks    Status New      OT LONG TERM GOAL #4   Title Pt will improve functional reaching/coordination for ADLs as shown by improving score on box and blocks test by at least 5 with dominant LUE.    Baseline 32 blocks    Time 8    Period Weeks    Status New      OT LONG TERM GOAL #5   Title Pt/caregiver will verbalize understanding of appropriate community resources.    Time 8    Period Weeks    Status New  Plan - 04/03/21 1507     Clinical Impression Statement Pt is progressing towards  goals. He demonstrates improved performance of stepping strategies and overhead reach with staggered foot stance following repetition.    OT Occupational Profile and History Detailed Assessment- Review of Records and additional review of physical, cognitive, psychosocial history related to current functional performance    Occupational performance deficits (Please refer to evaluation for details): ADL's;IADL's;Leisure    Body Structure / Function / Physical Skills Balance;ADL;Decreased knowledge of use of DME;UE functional use;IADL;Improper spinal/pelvic alignment;Vision;Mobility;Coordination;Decreased knowledge of precautions;FMC    Rehab Potential Good    Clinical Decision Making Several treatment options, min-mod task modification necessary    Comorbidities Affecting Occupational Performance: May have comorbidities impacting occupational performance    Modification or Assistance to Complete Evaluation  Min-Moderate modification of tasks or assist with assess necessary to complete eval    OT Frequency 2x / week    OT Duration 8 weeks   +eval   OT Treatment/Interventions Self-care/ADL training;Moist Heat;DME and/or AE instruction;Therapeutic activities;Therapeutic exercise;Cognitive remediation/compensation;Visual/perceptual remediation/compensation;Passive range of motion;Functional Mobility Training;Neuromuscular education;Cryotherapy;Energy conservation;Manual Therapy;Patient/family education    Plan check STGs; educate on ways to decr falls/future complications; reinforce functional mobility strategies as appropriate and timing of movement with large amplitude    Consulted and Agree with Plan of Care Patient;Family member/caregiver    Family Member Consulted wife             Patient will benefit from skilled therapeutic intervention in order to improve the following deficits and impairments:   Body Structure / Function / Physical Skills: Balance, ADL, Decreased knowledge of use of DME, UE  functional use, IADL, Improper spinal/pelvic alignment, Vision, Mobility, Coordination, Decreased knowledge of precautions, [place]       Visit Diagnosis: Other lack of coordination  Abnormal posture  Unsteadiness on feet  Visuospatial deficit  Other symptoms and signs involving the nervous system  Muscle weakness (generalized)  Other abnormalities of gait and mobility    Problem List Patient Active Problem List   Diagnosis Date Noted   HOH (hard of hearing) 12/18/2020   Personal history of colonic polyps 08/31/2016   Abnormality of gait 08/27/2014   Memory difficulty 08/27/2014   Atrial fibrillation ([place]) 09/08/2013   Cholelithiases 07/19/2013   Cholelithiasis 07/19/2013   Encounter for therapeutic drug monitoring 07/03/2013   Aortic valve replaced 04/05/2013   Parkinson disease ([place]) 09/27/2012   Orthostatic hypotension 04/01/2012   Pacemaker-Medtronic 03/29/2012   HEPATITIS A 06/04/2010   HYPERTHYROIDISM 06/04/2010   HYPERLIPIDEMIA 06/04/2010   Essential hypertension 06/04/2010   CAD 06/04/2010   AORTIC STENOSIS 06/04/2010   SICK SINUS SYNDROME 06/04/2010   HIATAL HERNIA 06/04/2010   SHORTNESS OF BREATH 06/04/2010   CHEST PAIN 06/04/2010    Omaree Fuqua, OT/L 04/03/2021, 3:08 PM  [place] [facility] [address] [place], [place], [address] Phone: [phone]   Fax:  [phone]  Name: Donald [name] MRN: [id number] Date of Birth: [birth year]

## 2021-04-03 ENCOUNTER — Ambulatory Visit (INDEPENDENT_AMBULATORY_CARE_PROVIDER_SITE_OTHER): Payer: PPO

## 2021-04-03 DIAGNOSIS — Z5181 Encounter for therapeutic drug level monitoring: Secondary | ICD-10-CM | POA: Diagnosis not present

## 2021-04-03 DIAGNOSIS — Z952 Presence of prosthetic heart valve: Secondary | ICD-10-CM

## 2021-04-03 LAB — POCT INR: INR: 3.9 — AB (ref 2.0–3.0)

## 2021-04-03 NOTE — Patient Instructions (Signed)
Description   Skip today's dosage of Warfarin, then start taking 1 tablet daily except 1/2 tablet on Sundays, Tuesdays and Thursdays. Recheck INR in 3 weeks. Call [phone] call with any new medications or if scheduled for any other procedures.

## 2021-04-14 ENCOUNTER — Ambulatory Visit: Payer: PPO | Admitting: Physical Therapy

## 2021-04-14 ENCOUNTER — Other Ambulatory Visit: Payer: Self-pay

## 2021-04-14 ENCOUNTER — Encounter: Payer: Self-pay | Admitting: Speech Pathology

## 2021-04-14 ENCOUNTER — Ambulatory Visit: Payer: PPO | Admitting: Speech Pathology

## 2021-04-14 ENCOUNTER — Ambulatory Visit: Payer: PPO | Admitting: Occupational Therapy

## 2021-04-14 ENCOUNTER — Encounter: Payer: Self-pay | Admitting: Occupational Therapy

## 2021-04-14 ENCOUNTER — Encounter: Payer: Self-pay | Admitting: Physical Therapy

## 2021-04-14 DIAGNOSIS — R278 Other lack of coordination: Secondary | ICD-10-CM | POA: Diagnosis not present

## 2021-04-14 DIAGNOSIS — R471 Dysarthria and anarthria: Secondary | ICD-10-CM

## 2021-04-14 DIAGNOSIS — R293 Abnormal posture: Secondary | ICD-10-CM | POA: Diagnosis not present

## 2021-04-14 DIAGNOSIS — R2681 Unsteadiness on feet: Secondary | ICD-10-CM

## 2021-04-14 DIAGNOSIS — R1312 Dysphagia, oropharyngeal phase: Secondary | ICD-10-CM | POA: Diagnosis not present

## 2021-04-14 DIAGNOSIS — R29818 Other symptoms and signs involving the nervous system: Secondary | ICD-10-CM

## 2021-04-14 DIAGNOSIS — R41842 Visuospatial deficit: Secondary | ICD-10-CM

## 2021-04-14 DIAGNOSIS — R2689 Other abnormalities of gait and mobility: Secondary | ICD-10-CM | POA: Diagnosis not present

## 2021-04-14 DIAGNOSIS — R251 Tremor, unspecified: Secondary | ICD-10-CM | POA: Diagnosis not present

## 2021-04-14 DIAGNOSIS — R1310 Dysphagia, unspecified: Secondary | ICD-10-CM | POA: Diagnosis not present

## 2021-04-14 DIAGNOSIS — M6281 Muscle weakness (generalized): Secondary | ICD-10-CM

## 2021-04-14 DIAGNOSIS — Z9181 History of falling: Secondary | ICD-10-CM | POA: Diagnosis not present

## 2021-04-14 NOTE — Patient Instructions (Signed)
Access Code: [email or web address] URL: [email or web address] Date: 04/14/2021 Prepared by: Sherlie Ban  Exercises Seated Active Hip Flexion - 2 x daily - 5 x weekly - 1-2 sets - 10 reps Side to side weightshift - 2 x daily - 7 x weekly - 1 sets - 10 reps Side Stepping with Counter Support - 2 x daily - 5 x weekly - 2 sets - 10 reps Alternating Step Backward with Support - 2 x daily - 5 x weekly - 2 sets - 10 reps

## 2021-04-14 NOTE — Therapy (Signed)
[facility] [address] [place], [place], [address] Phone: [phone]   Fax:  [phone]  Speech Language Pathology Treatment  Patient Details  Name: Donald [name] MRN: [id number] Date of Birth: [birth year] Referring Provider (SLP): Dr. Lurena Joinerebecca Tat   Encounter Date: 04/14/2021   End of Session - 04/14/21 1206     Visit Number 6    Number of Visits 25    Date for SLP Re-Evaluation 05/23/21    SLP Start Time 0846    SLP Stop Time  0929    SLP Time Calculation (min) 43 min    Activity Tolerance Patient tolerated treatment well             Past Medical History:  Diagnosis Date   Abnormality of gait 08/27/2014   Anxiety    Aortic stenosis    s/p AVR by Dr Laneta SimmersBartle   Arthritis    left wrist- probable- not diagonised   Asthma    in the past   CAD (coronary artery disease)    s/p CABG 2006   GERD (gastroesophageal reflux disease)    H/O seasonal allergies    H/O: rheumatic fever    Hearing deficit    Bilateral hearing aids   Hematuria    had CT scan   Hiatal hernia    History of kidney stones    HOH (hard of hearing)    Hyperlipidemia    Hypertension    Hyperthyroidism    Inguinal hernia    right side; watching at this time per wife's report   Memory difficulty 08/27/2014   Pacemaker    Parkinson's disease    Persistent atrial fibrillation ([facility])    Scarlet fever    as a child   Shortness of breath    Sick sinus syndrome ([facility])    s/p PPM (MDT) 2006    Past Surgical History:  Procedure Laterality Date   AORTIC VALVE REPLACEMENT  2006   CARDIOVERSION N/A 09/14/2013   Procedure: CARDIOVERSION;  Surgeon: Thurmon FairMihai Croitoru, MD;  Location: [facility] ENDOSCOPY;  Service: Cardiovascular;  Laterality: N/A;   CHOLECYSTECTOMY N/A 07/21/2013   Procedure: LAPAROSCOPIC CHOLECYSTECTOMY;  Surgeon: Mariella SaaBenjamin T Hoxworth, MD;  Location: [facility] OR;  Service: General;  Laterality: N/A;   COLONOSCOPY W/ POLYPECTOMY      COLONOSCOPY WITH PROPOFOL N/A 08/31/2016   Procedure: COLONOSCOPY WITH PROPOFOL;  Surgeon: Charlott RakesVincent Schooler, MD;  [facility];  Service: Endoscopy;  Laterality: N/A;   CORONARY ARTERY BYPASS GRAFT  2006   DENTAL SURGERY     implants   INSERT / REPLACE / REMOVE PACEMAKER  2006, 2012   MDT for sick sinus syndrome, gen change 8/12 by JA   KIDNEY STONE SURGERY  1984    There were no vitals filed for this visit.   Subjective Assessment - 04/14/21 0851     Subjective "Had a wonderful time"    Patient is accompained by: Family member   spouse, Donald [name]   Currently in Pain? No/denies                   ADULT SLP TREATMENT - 04/14/21 0852       General Information   Behavior/Cognition Alert;Cooperative;Requires cueing;Hard of hearing      Treatment Provided   Treatment provided Dysphagia;Cognitive-Linquistic      Dysphagia Treatment   Temperature Spikes Noted No    Respiratory Status Room air    Oral Cavity - Dentition Adequate natural dentition  Treatment Methods Skilled observation;Therapeutic exercise;Compensation strategy training    Patient observed directly with PO's Yes    Type of PO's observed Thin liquids;Dysphagia 3 (soft)    Feeding Able to feed self    Liquids provided via Cup    Pharyngeal Phase Signs & Symptoms Immediate cough;Delayed cough    Type of cueing Verbal    Amount of cueing Minimal    Other treatment/comments Donald [name] reports that [name] at home. Today, Donald [name] required min questioninng cues to verbalized swallow precautions. With PO trials, Donald [name] followed swallow precautions with rare min A for slow rate and to swallow after he coughs.      Cognitive-Linquistic Treatment   Treatment focused on Dysarthria;Patient/family/caregiver education    Skilled Treatment Volume recalibrated with loud AH! to recalibrate volume with average of 85dB with rare min A. 2-3 word responses in structured  conversation with average of 68dB 10/12 responses with mod A for breath support and volume. Donald [name] reports family had difficult time hearing [place].      Dysphagia Recommendations   Diet recommendations Thin liquid    Liquids provided via Cup    Medication Administration Whole meds with liquid    Compensations Slow rate;Small sips/bites;Effortful swallow;Follow solids with liquid    Postural Changes and/or Swallow Maneuvers Seated upright 90 degrees      Progression Toward Goals   Progression toward goals Progressing toward goals              SLP Education - 04/14/21 0930     Education Details Keep more consistent with HEP for speech and CTAR    Person(s) Educated Patient;Spouse    Methods Explanation;Demonstration;Verbal cues    Comprehension Verbalized understanding;Returned demonstration;Verbal cues required;Need further instruction              SLP Short Term Goals - 04/14/21 1204       SLP SHORT TERM GOAL #1   Title Pt will average 85dB on loud /a/ with occasional min A over 2 sessions.    Time 4    Period Weeks    Status On-going      SLP SHORT TERM GOAL #2   Title Pt will average 68dB on 18/20 sentences with occasional min A    Time 4    Period Weeks    Status On-going      SLP SHORT TERM GOAL #3   Title Pt/spouse will follow 2 diet modificiations with occasional min A over 3 sessions    Baseline 04/14/21    Time 4    Period Weeks    Status On-going      SLP SHORT TERM GOAL #4   Title Pt will complete HEP for dysphagia with usual min  visual and verbal cues over 2 sessions    Time 5    Period Weeks    Status Achieved      SLP SHORT TERM GOAL #5   Title Pt will average 68dB over 5 minute conversation with occasional min A over 2 sessions    Baseline 04/14/21    Time 4    Period Weeks    Status On-going              SLP Long Term Goals - 04/14/21 1205       SLP LONG TERM GOAL #1   Title Pt will average 85dB  on loud /a/ with rare min A over 2 sessions    Time 10  Period Weeks    Status On-going      SLP LONG TERM GOAL #2   Title Pt will average 68dB over 12 minute conversation with occasional min A    Time 10    Period Weeks    Status On-going      SLP LONG TERM GOAL #3   Title Pt will complete HEP for dysarthria and dysphagia with occasional min A over 3 sessions    Time 10    Period Weeks    Status On-going      SLP LONG TERM GOAL #4   Title Pt will follow swallow precautions with occasional min A over 3 sessions    Baseline 04/14/21;    Time 10    Period Weeks    Status On-going      SLP LONG TERM GOAL #5   Title Pt/spouse will verbalize 4 s/s of aspiration pna with rare min A    Time 10    Period Weeks    Status On-going              Plan - 04/14/21 0931     Clinical Impression Statement Ongoing training of HEP for dysphagia and swallow precautions. They traveled for [place] ing so  HEP for dysarthria and CTAR were not done over the holiday. [name] and [name] report diffculty  communicating due to Donald [name]'s low volume and running his words together. [place] requires mod A to maintain volume in 2-3 word responses in structured tasks and simple conversation. Donald [name] follows swallow precautions withsupervision cues in therapy, and inconsistently at home per spouse. Virgel Manifold education re: s/s of aspiration pna. Continue skilled ST to maximize intelligilbity and safety fo swallow.    Speech Therapy Frequency 2x / week    Duration 12 weeks    Treatment/Interventions Aspiration precaution training;Language facilitation;Environmental controls;Cueing hierarchy;SLP instruction and feedback;Pharyngeal strengthening exercises;Compensatory techniques;Cognitive reorganization;Functional tasks;Compensatory strategies;Patient/family education;Multimodal communcation approach;Internal/external aids;Trials of upgraded texture/liquids;Diet toleration management by SLP;Other (comment)     Potential to Achieve Goals Fair    Potential Considerations Medical prognosis;Severity of impairments;Previous level of function             Patient will benefit from skilled therapeutic intervention in order to improve the following deficits and impairments:   Dysphagia, oropharyngeal phase  Dysarthria and anarthria    Problem List Patient Active Problem List   Diagnosis Date Noted   HOH (hard of hearing) 12/18/2020   Personal history of colonic polyps 08/31/2016   Abnormality of gait 08/27/2014   Memory difficulty 08/27/2014   Atrial fibrillation ([place]) 09/08/2013   Cholelithiases 07/19/2013   Cholelithiasis 07/19/2013   Encounter for therapeutic drug monitoring 07/03/2013   Aortic valve replaced 04/05/2013   Parkinson disease ([place]) 09/27/2012   Orthostatic hypotension 04/01/2012   Pacemaker-Medtronic 03/29/2012   HEPATITIS A 06/04/2010   HYPERTHYROIDISM 06/04/2010   HYPERLIPIDEMIA 06/04/2010   Essential hypertension 06/04/2010   CAD 06/04/2010   AORTIC STENOSIS 06/04/2010   SICK SINUS SYNDROME 06/04/2010   HIATAL HERNIA 06/04/2010   SHORTNESS OF BREATH 06/04/2010   CHEST PAIN 06/04/2010    Corianne Buccellato, Annye Rusk, [place] 04/14/2021, 12:06 PM  [facility] [address] [place], [place], [address] Phone: [phone]   Fax:  [phone]   Name: ACHARY ARAGONA MRN: [id number] Date of Birth: 1940-07-11

## 2021-04-14 NOTE — Therapy (Signed)
[facility] [address] [place], [place], [address] Phone: [phone]   Fax:  [phone]  Occupational Therapy Treatment  Patient Details  Name: Donald [name] MRN: [id number] Date of Birth: [birth year] Referring Provider (OT): Dr. Wells Guiles Tat   Encounter Date: 04/14/2021   OT End of Session - 04/14/21 0940     Visit Number 9    Number of Visits 17    Date for OT Re-Evaluation 04/26/21    Authorization Type [employer], follow Medicare guidelines, copay per day    Authorization - Visit Number 9    Authorization - Number of Visits 10    Progress Note Due on Visit 10    OT Start Time 0935    OT Stop Time 1015    OT Time Calculation (min) 40 min    Activity Tolerance Patient tolerated treatment well    Behavior During Therapy [facility] for tasks assessed/performed;Impulsive             Past Medical History:  Diagnosis Date   Abnormality of gait 08/27/2014   Anxiety    Aortic stenosis    s/p AVR by Dr Cyndia Bent   Arthritis    left wrist- probable- not diagonised   Asthma    in the past   CAD (coronary artery disease)    s/p CABG 2006   GERD (gastroesophageal reflux disease)    H/O seasonal allergies    H/O: rheumatic fever    Hearing deficit    Bilateral hearing aids   Hematuria    had CT scan   Hiatal hernia    History of kidney stones    HOH (hard of hearing)    Hyperlipidemia    Hypertension    Hyperthyroidism    Inguinal hernia    right side; watching at this time per wife's report   Memory difficulty 08/27/2014   Pacemaker    Parkinson's disease    Persistent atrial fibrillation ([place])    Scarlet fever    as a child   Shortness of breath    Sick sinus syndrome ([place])    s/p PPM (MDT) 2006    Past Surgical History:  Procedure Laterality Date   AORTIC VALVE REPLACEMENT  2006   CARDIOVERSION N/A 09/14/2013   Procedure: CARDIOVERSION;  Surgeon: Sanda Klein, MD;  Location: [facility]  ENDOSCOPY;  Service: Cardiovascular;  Laterality: N/A;   CHOLECYSTECTOMY N/A 07/21/2013   Procedure: LAPAROSCOPIC CHOLECYSTECTOMY;  Surgeon: Edward Jolly, MD;  Location: [facility];  Service: General;  Laterality: N/A;   COLONOSCOPY W/ POLYPECTOMY     COLONOSCOPY WITH PROPOFOL N/A 08/31/2016   Procedure: COLONOSCOPY WITH PROPOFOL;  Surgeon: Wilford Corner, MD;  Location: [facility] ENDOSCOPY;  Service: Endoscopy;  Laterality: N/A;   CORONARY ARTERY BYPASS GRAFT  2006   DENTAL SURGERY     implants   INSERT / REPLACE / REMOVE PACEMAKER  2006, 2012   MDT for sick sinus syndrome, gen change 8/12 by [place]    There were no vitals filed for this visit.   Subjective Assessment - 04/14/21 0937     Subjective  Pt reports 2 falls (fell in the bathroom and fell trying to get in bed)--at condo    Pertinent History Parkinson's Disease.   PMH:  hx of falls (wears helment), arthritis, asthma, CAD, GERD, HOH, HLD, HTN, hyperthyroidism, anxiety, aortic stenosis, hx of aortic valve replacement 2006, memory difficulty, pacemaker, persistent a-fib, hx of scarlet  fever, shortness of breath, hx of CABG 2006, cholecystectomy 2015    Limitations fall risk (wears helment and pads), freezing, hard of hearing--reads lips (**wear clear mask)    Patient Stated Goals improve coordination    Currently in Pain? No/denies              Checked 9-hole peg test--see below.  Pt demo significant shoulder compensation with decr in-hand manipulation and grasp (with dystonia patterns)  Placing medium pegs in pegboard with each hand x2 rows each with mod cueing (verbal, demo/visual, and tactile at elbow to block shoulder hike/IR).  Pt appeared to demo improvement with "elbow down" and starting with neutral forearm and PWR! Hands prior to grasp.    Pt/caregiver reports performing HEP, but did not due as much over holiday due to being out of town.  Standing with RW, performing lateral functional reaching  with lateral wt. Shifts and trunk rotation with each UE/to each side with mod (verbal/visual/tactile) cueing for large amplitude movement strategies and balance strategies (feet apart, heels down) to decr risk of falls.  Then performing functional reaching forward/back with LUE and feet staggered (apart and right foot forward) with mod cueing for large amplitude movement strategies and balance strategies.  Reinforced/mod cues for large amplitude movements with ambulation with RW during session.  Closed-chain shoulder flex with BUEs with ball (from HEP) floor>overhead with min cueing for tracking ball with eyes and avoid hyperext of trunk     OT Short Term Goals - 04/14/21 0943       OT SHORT TERM GOAL #1   Title Pt will perform PD-specific HEP with cueing prn from caregiver--check STGs 03/27/21    Time 4    Period Weeks    Status Achieved      OT SHORT TERM GOAL #2   Title Pt will improve R hand coordination for ADLs (buttoning/tying shoes) as shown by completing 9-hole peg test in 75sec or less.    Baseline 88.31sec    Time 4    Period Weeks    Status On-going   04/14/21:  not met/>36mn     OT SHORT TERM GOAL #3   Title Pt will improve L hand coordination for ADLs (buttoning/tying shoes) as shown by completing 9-hole peg test in 90 sec or less.    Baseline 108.94sec    Time 4    Period Weeks    Status On-going   04/14/21:  276m 32.91sec     OT SHORT TERM GOAL #4   Title Pt/caregiver will verbalize understanding of ways to decr risk of future complcations related to PD (including falls).    Time 4    Period Weeks    Status On-going               OT Long Term Goals - 02/25/21 2128       OT LONG TERM GOAL #1   Title Pt/caregiver will verbalize understanding of AE/strategies to incr ease, safety, and independence with ADLs/IADLs.--check LTGs 04/26/21    Time 8    Period Weeks    Status New      OT LONG TERM GOAL #2   Title Pt will improve R hand coordination for  ADLs (buttoning/tying shoes) as shown by completing 9-hole peg test in 65sec or less.    Time 8    Period Weeks    Status New      OT LONG TERM GOAL #3   Title Pt will improve L hand coordination for  ADLs (buttoning/tying shoes) as shown by completing 9-hole peg test in 75 sec or less.    Time 8    Period Weeks    Status New      OT LONG TERM GOAL #4   Title Pt will improve functional reaching/coordination for ADLs as shown by improving score on box and blocks test by at least 5 with dominant LUE.    Baseline 32 blocks    Time 8    Period Weeks    Status New      OT LONG TERM GOAL #5   Title Pt/caregiver will verbalize understanding of appropriate community resources.    Time 8    Period Weeks    Status New                   Plan - 04/14/21 0941     Clinical Impression Statement Pt is progressing slowly towards goals. He demo improved coordination with mod cueing for avoiding shoulder compensation.    OT Occupational Profile and History Detailed Assessment- Review of Records and additional review of physical, cognitive, psychosocial history related to current functional performance    Occupational performance deficits (Please refer to evaluation for details): ADL's;IADL's;Leisure    Body Structure / Function / Physical Skills Balance;ADL;Decreased knowledge of use of DME;UE functional use;IADL;Improper spinal/pelvic alignment;Vision;Mobility;Coordination;Decreased knowledge of precautions;FMC    Rehab Potential Good    Clinical Decision Making Several treatment options, min-mod task modification necessary    Comorbidities Affecting Occupational Performance: May have comorbidities impacting occupational performance    Modification or Assistance to Complete Evaluation  Min-Moderate modification of tasks or assist with assess necessary to complete eval    OT Frequency 2x / week    OT Duration 8 weeks   +eval   OT Treatment/Interventions Self-care/ADL training;Moist  Heat;DME and/or AE instruction;Therapeutic activities;Therapeutic exercise;Cognitive remediation/compensation;Visual/perceptual remediation/compensation;Passive range of motion;Functional Mobility Training;Neuromuscular education;Cryotherapy;Energy conservation;Manual Therapy;Patient/family education    Plan **progress note; educate on ways to decr falls/future complications; reinforce functional mobility strategies as appropriate and timing of movement with large amplitude and avoiding shoulder compensation with coordination    Consulted and Agree with Plan of Care Patient;Family member/caregiver    Family Member Consulted wife             Patient will benefit from skilled therapeutic intervention in order to improve the following deficits and impairments:   Body Structure / Function / Physical Skills: Balance, ADL, Decreased knowledge of use of DME, UE functional use, IADL, Improper spinal/pelvic alignment, Vision, Mobility, Coordination, Decreased knowledge of precautions, [place]       Visit Diagnosis: Other lack of coordination  Unsteadiness on feet  Visuospatial deficit  Abnormal posture  Other symptoms and signs involving the nervous system  Muscle weakness (generalized)  Tremor    Problem List Patient Active Problem List   Diagnosis Date Noted   HOH (hard of hearing) 12/18/2020   Personal history of colonic polyps 08/31/2016   Abnormality of gait 08/27/2014   Memory difficulty 08/27/2014   Atrial fibrillation ([place]) 09/08/2013   Cholelithiases 07/19/2013   Cholelithiasis 07/19/2013   Encounter for therapeutic drug monitoring 07/03/2013   Aortic valve replaced 04/05/2013   Parkinson disease ([place]) 09/27/2012   Orthostatic hypotension 04/01/2012   Pacemaker-Medtronic 03/29/2012   HEPATITIS A 06/04/2010   HYPERTHYROIDISM 06/04/2010   HYPERLIPIDEMIA 06/04/2010   Essential hypertension 06/04/2010   CAD 06/04/2010   AORTIC STENOSIS 06/04/2010   SICK SINUS SYNDROME  06/04/2010   HIATAL HERNIA 06/04/2010  SHORTNESS OF BREATH 06/04/2010   CHEST PAIN 06/04/2010    Wayne Wicklund, OT/L 04/14/2021, 11:43 AM  [facility] [address] [place], [place], [address] Phone: [phone]   Fax:  [phone]  Name: Donald [name] MRN: [id number] Date of Birth: 10-08-1940  Vianne Bulls, OTR/L [facility] [address] [place], [place]  [address] [phone] phone [phone] 04/14/21 11:53 AM

## 2021-04-14 NOTE — Therapy (Addendum)
[facility] [address] [place], [place], [address] Phone: [phone]   Fax:  [phone]  Physical Therapy Treatment  Patient Details  Name: Donald [name] MRN: [id number] Date of Birth: 09-24-1940 Referring Provider (PT): Dr. Carles Collet   Encounter Date: 04/14/2021   PT End of Session - 04/14/21 1227     Visit Number 14    Number of Visits 17    Date for PT Re-Evaluation 04/29/21    Authorization Type HTA - $30 co pay    PT Start Time 1018    PT Stop Time 1100    PT Time Calculation (min) 42 min    Equipment Utilized During Treatment Gait belt    Activity Tolerance Patient tolerated treatment well    Behavior During Therapy [facility] for tasks assessed/performed;Impulsive             Past Medical History:  Diagnosis Date   Abnormality of gait 08/27/2014   Anxiety    Aortic stenosis    s/p AVR by Dr Cyndia Bent   Arthritis    left wrist- probable- not diagonised   Asthma    in the past   CAD (coronary artery disease)    s/p CABG 2006   GERD (gastroesophageal reflux disease)    H/O seasonal allergies    H/O: rheumatic fever    Hearing deficit    Bilateral hearing aids   Hematuria    had CT scan   Hiatal hernia    History of kidney stones    HOH (hard of hearing)    Hyperlipidemia    Hypertension    Hyperthyroidism    Inguinal hernia    right side; watching at this time per wife's report   Memory difficulty 08/27/2014   Pacemaker    Parkinson's disease    Persistent atrial fibrillation ([place])    Scarlet fever    as a child   Shortness of breath    Sick sinus syndrome ([place])    s/p PPM (MDT) 2006    Past Surgical History:  Procedure Laterality Date   AORTIC VALVE REPLACEMENT  2006   CARDIOVERSION N/A 09/14/2013   Procedure: CARDIOVERSION;  Surgeon: Sanda Klein, MD;  Location: [facility] ENDOSCOPY;  Service: Cardiovascular;  Laterality: N/A;   CHOLECYSTECTOMY N/A 07/21/2013   Procedure: LAPAROSCOPIC  CHOLECYSTECTOMY;  Surgeon: Edward Jolly, MD;  Location: [facility];  Service: General;  Laterality: N/A;   COLONOSCOPY W/ POLYPECTOMY     COLONOSCOPY WITH PROPOFOL N/A 08/31/2016   Procedure: COLONOSCOPY WITH PROPOFOL;  Surgeon: Wilford Corner, MD;  Location: [facility] ENDOSCOPY;  Service: Endoscopy;  Laterality: N/A;   CORONARY ARTERY BYPASS GRAFT  2006   DENTAL SURGERY     implants   INSERT / REPLACE / REMOVE PACEMAKER  2006, 2012   MDT for sick sinus syndrome, gen change 8/12 by [place]    There were no vitals filed for this visit.   Subjective Assessment - 04/14/21 1022     Subjective Had a good Thanksgiving. Had 2 falls - one was when he was getting into bed and the 2nd one was when he was in the bathroom. Doesn't remember how, but he thinks he fell backwards. Feeling a little bit tired today.    Pertinent History PMH: PD, CAD (s/p CABG 2006), LD, HTN, a fib, pacemaker, GERD, on anticoagulants    Limitations Walking;House hold activities;Standing    Patient Stated Goals wants to improve his balance  Currently in Pain? No/denies                               [facility] Adult PT Treatment/Exercise - 04/14/21 1448       Transfers   Transfers Sit to Stand;Stand to Sit    Sit to Stand 5: Supervision    Stand to Sit 5: Supervision    Comments x5 reps, plus additional reps throughout session with working on scooting towards edge, incr forward lean, and standing with tall posture      Ambulation/Gait   Ambulation/Gait Yes    Ambulation/Gait Assistance 4: Min guard    Ambulation/Gait Assistance Details With gait with RW, cues to take BIG steps towards the red tband and to perform with heel strike for incr foot clearance vs. walking on toes for incr stride length. Needs cues throughout gait. Needs cues to stop and reset with bigger steps during festination episodes.    Ambulation Distance (Feet) 115 Feet   x2   Assistive device Rolling walker     Gait Pattern Step-through pattern;Decreased step length - right;Decreased step length - left    Ambulation Surface Level;Indoor                 Balance Exercises - 04/14/21 1053       Balance Exercises: Standing   Stepping Strategy Posterior;Lateral;UE support    Stepping Strategy Limitations At the countertop, performed lateral stepping strategy x15 reps each side over yardstick for improved foot clearance, needing cues to keep stance leg in the middle and not moving it. Then performed massed practice of posterior stepping strategy x20 reps each side stepping posterior/laterally at a diagonal for keeping a wider BOS, cues to keep heel fully on the floor when stepping. Added these to pt's HEP with spouse present throughout for instruction. Pt's spouse going to use colored tape on floor as visual cue on what to step to/step over    Other Standing Exercises Standing at RW: Kicks out to band 2 x 10 reps for foot clearance with cues for slowed pace, forward stepping to floor dot for obstacle 2 x 10 reps, cues for weight shifting and heel strike when stepping on onject    Other Standing Exercises Comments With BUE support: x20 reps toe raises, with multimodal cues for proper technique, cues for slowed pace and then returning back to midline with foot fully on floor, min guard                PT Education - 04/14/21 1226     Education Details Added lateral and posterior stepping strategy for home with pt to perform with spouse.    Person(s) Educated Patient;Spouse    Methods Explanation;Demonstration;Verbal cues;Handout    Comprehension Verbalized understanding;Returned demonstration;Verbal cues required              PT Short Term Goals - 03/11/21 1117       PT SHORT TERM GOAL #1   Title Pt will perform initial HEP with supervision from pt's spouse for strength, balance, and transfers in order to build upon functional gains made in therapy. ALL STGS DUE 02/26/21    Time 4     Period Weeks    Status New    Target Date 02/26/21      PT SHORT TERM GOAL #2   Title Pt and pt's spouse will verbalize understanding of fall prevention in the home.    Baseline  provided handout on 03/11/21    Time 4    Period Weeks    Status Achieved      PT SHORT TERM GOAL #3   Title Pt and pt's spouse will verbalize understanding of techniques to help with freezing/festination episodes in order to demo improved safety with gait.    Baseline Reviewed today in session on 02/25/21 - will continue to benefit from additional practice/repetition with wife practicing cues with pt    Time 4    Period Weeks    Status Partially Met      PT SHORT TERM GOAL #4   Title Pt will perform TUG with RW vs. UStep Walker in 21 seconds or less in order to demo decr fall risk.    Baseline 23.75 seconds, 51 seconds first attempt 42 seconds 2nd attempt    Time 4    Period Weeks    Status Not Met      PT SHORT TERM GOAL #5   Title Pt will perform 5 reps of sit <> stand with BUE support with proper technique and without BLE bracing in order to demo improved safety with functional transfers.    Baseline met on 02/25/21    Time 4    Period Weeks    Status Achieved               PT Long Term Goals - 03/26/21 1413       PT LONG TERM GOAL #1   Title Pt will perform final HEP with supervision from pt's spouse for strength, balance, and transfers in order to build upon functional gains made in therapy. ALL LTGS DUE 04/21/21    Baseline 03/26/21 PT continues to add to HEP    Time 12    Period Weeks    Status On-going    Target Date 04/21/21      PT LONG TERM GOAL #2   Title Pt will ambulate >500' with RW supervision on varied level surfaces for improved household and short community distances.    Time 12    Period Weeks    Status Revised    Target Date 04/21/21      PT LONG TERM GOAL #3   Title Pt will be able to perform 5 x sit to stand in <12 sec without hands but controlling descent for  improved balance and functional mobility.    Baseline 22.22 seconds, decr eccentric control; 12.14 seconds w/o use of UE, w decr eccentric control 03/26/21    Time 12    Period Weeks    Status Revised    Target Date 04/21/21      PT LONG TERM GOAL #4   Title Pt will perform TUG with RW in <16 sec for improved balance and functional mobility.    Baseline 23.75 seconds with RW, 18.54 seconds with RW 03/26/21    Time 12    Period Weeks    Status Revised    Target Date 04/21/21                   Plan - 04/15/21 0926     Clinical Impression Statement Today's skilled session worked on standing balance/foot clearance activities with focus on heel strike and cues for heel strike during gait with longer strides. Pt able to perform for a few steps but then reverts to more foot flat contact and striking first on his toes. Worked on massed practice of lateral and posterior stepping strategies and added to pt's  HEP to perform at sink with wife supervision giving proper cues for technique. Will continue to progress towards LTGs.    Personal Factors and Comorbidities Comorbidity 3+;Time since onset of injury/illness/exacerbation;Past/Current Experience;Behavior Pattern    Comorbidities PD (diagnosed 2012), CAD (s/p CABG 2006), LD, HTN, a fib, pacemaker, GERD, on anticoagulants    Examination-Activity Limitations Bed Mobility;Caring for Others;Toileting;Transfers;Stairs;Locomotion Level;Stand;Squat;Reach Overhead    Designer, fashion/clothing;Yard Work    Merchant [employer] officer Evolving/Moderate complexity    Rehab Potential Good    PT Frequency 2x / week    PT Duration 12 weeks    PT Treatment/Interventions ADLs/Self Care Home Management;DME Instruction;Therapeutic activities;Functional mobility training;Gait training;Stair training;Therapeutic exercise;Balance training;Neuromuscular re-education;Patient/family education;Energy  conservation;Vestibular    PT Next Visit Plan review stepping strategies to HEP.  Pt is very hard of hearing even with hearing aids (need to wear clear mask). gait trianing with RW using red tband as big kicks/steps to the red. or try counting steps again vs. use of laser for gait?  standing foot clearance and weight shifting. seated PWR?    PT Home Exercise Plan [id number]    Consulted and Agree with Plan of Care Patient;Family member/caregiver    Family Member Consulted pt's [name]             Patient will benefit from skilled therapeutic intervention in order to improve the following deficits and impairments:  Abnormal gait, Decreased activity tolerance, Decreased coordination, Decreased balance, Decreased endurance, Decreased knowledge of use of DME, Decreased safety awareness, Decreased mobility, Decreased strength, Difficulty walking, Impaired flexibility, Postural dysfunction  Visit Diagnosis: Other lack of coordination  Unsteadiness on feet  Abnormal posture  Other symptoms and signs involving the nervous system  Muscle weakness (generalized)     Problem List Patient Active Problem List   Diagnosis Date Noted   HOH (hard of hearing) 12/18/2020   Personal history of colonic polyps 08/31/2016   Abnormality of gait 08/27/2014   Memory difficulty 08/27/2014   Atrial fibrillation ([place]) 09/08/2013   Cholelithiases 07/19/2013   Cholelithiasis 07/19/2013   Encounter for therapeutic drug monitoring 07/03/2013   Aortic valve replaced 04/05/2013   Parkinson disease ([place]) 09/27/2012   Orthostatic hypotension 04/01/2012   Pacemaker-Medtronic 03/29/2012   HEPATITIS A 06/04/2010   HYPERTHYROIDISM 06/04/2010   HYPERLIPIDEMIA 06/04/2010   Essential hypertension 06/04/2010   CAD 06/04/2010   AORTIC STENOSIS 06/04/2010   SICK SINUS SYNDROME 06/04/2010   HIATAL HERNIA 06/04/2010   SHORTNESS OF BREATH 06/04/2010   CHEST PAIN 06/04/2010    Arliss Journey, PT, DPT   04/15/2021, 9:28 AM  [facility] [address] [place], [place], [address] Phone: [phone]   Fax:  [phone]  Name: Donald [name] MRN: [id number] Date of Birth: 23-May-1940

## 2021-04-16 ENCOUNTER — Encounter: Payer: Self-pay | Admitting: Physical Therapy

## 2021-04-16 ENCOUNTER — Ambulatory Visit: Payer: PPO | Admitting: Occupational Therapy

## 2021-04-16 ENCOUNTER — Encounter: Payer: Self-pay | Admitting: Speech Pathology

## 2021-04-16 ENCOUNTER — Ambulatory Visit: Payer: PPO | Admitting: Physical Therapy

## 2021-04-16 ENCOUNTER — Other Ambulatory Visit: Payer: Self-pay

## 2021-04-16 ENCOUNTER — Encounter: Payer: Self-pay | Admitting: Occupational Therapy

## 2021-04-16 ENCOUNTER — Ambulatory Visit: Payer: PPO | Admitting: Speech Pathology

## 2021-04-16 DIAGNOSIS — R29818 Other symptoms and signs involving the nervous system: Secondary | ICD-10-CM

## 2021-04-16 DIAGNOSIS — R293 Abnormal posture: Secondary | ICD-10-CM

## 2021-04-16 DIAGNOSIS — M6281 Muscle weakness (generalized): Secondary | ICD-10-CM

## 2021-04-16 DIAGNOSIS — R251 Tremor, unspecified: Secondary | ICD-10-CM | POA: Diagnosis not present

## 2021-04-16 DIAGNOSIS — R278 Other lack of coordination: Secondary | ICD-10-CM

## 2021-04-16 DIAGNOSIS — R2681 Unsteadiness on feet: Secondary | ICD-10-CM | POA: Diagnosis not present

## 2021-04-16 DIAGNOSIS — R471 Dysarthria and anarthria: Secondary | ICD-10-CM | POA: Diagnosis not present

## 2021-04-16 DIAGNOSIS — R1312 Dysphagia, oropharyngeal phase: Secondary | ICD-10-CM | POA: Diagnosis not present

## 2021-04-16 DIAGNOSIS — R41842 Visuospatial deficit: Secondary | ICD-10-CM

## 2021-04-16 DIAGNOSIS — R2689 Other abnormalities of gait and mobility: Secondary | ICD-10-CM | POA: Diagnosis not present

## 2021-04-16 DIAGNOSIS — R1310 Dysphagia, unspecified: Secondary | ICD-10-CM | POA: Diagnosis not present

## 2021-04-16 DIAGNOSIS — Z9181 History of falling: Secondary | ICD-10-CM | POA: Diagnosis not present

## 2021-04-16 NOTE — Therapy (Signed)
[facility] [address] [place], [place], [address] Phone: [phone]   Fax:  [phone]  Physical Therapy Treatment  Patient Details  Name: Donald [name] MRN: [id number] Date of Birth: 10-06-1940 Referring Provider (PT): Dr. Carles Collet   Encounter Date: 04/16/2021   PT End of Session - 04/16/21 1301     Visit Number 15    Number of Visits 17    Date for PT Re-Evaluation 04/29/21    Authorization Type HTA - $30 co pay    PT Start Time 1020    PT Stop Time 1100    PT Time Calculation (min) 40 min    Equipment Utilized During Treatment Gait belt    Activity Tolerance Patient tolerated treatment well    Behavior During Therapy [facility] for tasks assessed/performed;Impulsive             Past Medical History:  Diagnosis Date   Abnormality of gait 08/27/2014   Anxiety    Aortic stenosis    s/p AVR by Dr Cyndia Bent   Arthritis    left wrist- probable- not diagonised   Asthma    in the past   CAD (coronary artery disease)    s/p CABG 2006   GERD (gastroesophageal reflux disease)    H/O seasonal allergies    H/O: rheumatic fever    Hearing deficit    Bilateral hearing aids   Hematuria    had CT scan   Hiatal hernia    History of kidney stones    HOH (hard of hearing)    Hyperlipidemia    Hypertension    Hyperthyroidism    Inguinal hernia    right side; watching at this time per wife's report   Memory difficulty 08/27/2014   Pacemaker    Parkinson's disease    Persistent atrial fibrillation ([place])    Scarlet fever    as a child   Shortness of breath    Sick sinus syndrome ([place])    s/p PPM (MDT) 2006    Past Surgical History:  Procedure Laterality Date   AORTIC VALVE REPLACEMENT  2006   CARDIOVERSION N/A 09/14/2013   Procedure: CARDIOVERSION;  Surgeon: Sanda Klein, MD;  Location: [facility];  Service: Cardiovascular;  Laterality: N/A;   CHOLECYSTECTOMY N/A 07/21/2013   Procedure: LAPAROSCOPIC  CHOLECYSTECTOMY;  Surgeon: Edward Jolly, MD;  Location: [facility];  Service: General;  Laterality: N/A;   COLONOSCOPY W/ POLYPECTOMY     COLONOSCOPY WITH PROPOFOL N/A 08/31/2016   Procedure: COLONOSCOPY WITH PROPOFOL;  Surgeon: Wilford Corner, MD;  Location: [facility]COPY;  Service: Endoscopy;  Laterality: N/A;   CORONARY ARTERY BYPASS GRAFT  2006   DENTAL SURGERY     implants   INSERT / REPLACE / REMOVE PACEMAKER  2006, 2012   MDT for sick sinus syndrome, gen change 8/12 by [place]    There were no vitals filed for this visit.   Subjective Assessment - 04/16/21 1021     Subjective Has tried the exercises at home. Going well, except still some trouble getting his heels down on the floor. No falls.    Pertinent History PMH: PD, CAD (s/p CABG 2006), LD, HTN, a fib, pacemaker, GERD, on anticoagulants    Limitations Walking;House hold activities;Standing    Patient Stated Goals wants to improve his balance    Currently in Pain? No/denies  [facility] Adult PT Treatment/Exercise - 04/16/21 1022       Transfers   Transfers Sit to Stand;Stand to Sit    Sit to Stand 5: Supervision    Stand to Sit 5: Supervision    Comments Cues to scoot out towards the edge and for incr forward lean to stand      Ambulation/Gait   Ambulation/Gait Yes    Ambulation/Gait Assistance 4: Min guard    Ambulation/Gait Assistance Details With RW- cues for big steps/kicks out to the red tband for incr step length and foot clearance. Pt with festinating episodes with cues to stop and get heels fully on the ground before taking a big step    Ambulation Distance (Feet) 115 Feet    Assistive device Rolling walker    Gait Pattern Step-through pattern;Decreased step length - right;Decreased step length - left    Ambulation Surface Level;Indoor      Neuro Re-ed    Neuro Re-ed Details  Seated PWR Up x10 reps - cues for technique and opening up  hands, Rock x10 reps each side with reaching towards cones with cues to look with his eyes. With BUE support: standing staggered stance weight shift x12 reps each side (with use of floor dots for wider BOS) - manual/verbal cues for technique. Using that staggered stance holding on with single UE grabbing cone and then placing it into bottom of 1st and then grabbing cone and handing back to therapist x10 reps each side - cues to go slowly and to open hand big when grabbing cone. With wide BOS - lateral reaching to grab cone from PTA student and then trunk rotation and giving to PT on other side x8 reps each side. Modified quadraped PWR Step at countertop to targets, x12 reps each leg stepping over 2" obstacle for foot clearance.                 Balance Exercises - 04/16/21 0001       Balance Exercises: Standing   Stepping Strategy Posterior;UE support;Limitations    Stepping Strategy Limitations x15 reps each side, using visual target on where to step to, multi modal cues for technique. Cues to get foot fully down on the ground when weight shifting.                  PT Short Term Goals - 03/11/21 1117       PT SHORT TERM GOAL #1   Title Pt will perform initial HEP with supervision from pt's spouse for strength, balance, and transfers in order to build upon functional gains made in therapy. ALL STGS DUE 02/26/21    Time 4    Period Weeks    Status New    Target Date 02/26/21      PT SHORT TERM GOAL #2   Title Pt and pt's spouse will verbalize understanding of fall prevention in the home.    Baseline provided handout on 03/11/21    Time 4    Period Weeks    Status Achieved      PT SHORT TERM GOAL #3   Title Pt and pt's spouse will verbalize understanding of techniques to help with freezing/festination episodes in order to demo improved safety with gait.    Baseline Reviewed today in session on 02/25/21 - will continue to benefit from additional practice/repetition with  wife practicing cues with pt    Time 4    Period Weeks    Status Partially Met  PT SHORT TERM GOAL #4   Title Pt will perform TUG with RW vs. UStep Walker in 21 seconds or less in order to demo decr fall risk.    Baseline 23.75 seconds, 51 seconds first attempt 42 seconds 2nd attempt    Time 4    Period Weeks    Status Not Met      PT SHORT TERM GOAL #5   Title Pt will perform 5 reps of sit <> stand with BUE support with proper technique and without BLE bracing in order to demo improved safety with functional transfers.    Baseline met on 02/25/21    Time 4    Period Weeks    Status Achieved               PT Long Term Goals - 03/26/21 1413       PT LONG TERM GOAL #1   Title Pt will perform final HEP with supervision from pt's spouse for strength, balance, and transfers in order to build upon functional gains made in therapy. ALL LTGS DUE 04/21/21    Baseline 03/26/21 PT continues to add to HEP    Time 12    Period Weeks    Status On-going    Target Date 04/21/21      PT LONG TERM GOAL #2   Title Pt will ambulate >500' with RW supervision on varied level surfaces for improved household and short community distances.    Time 12    Period Weeks    Status Revised    Target Date 04/21/21      PT LONG TERM GOAL #3   Title Pt will be able to perform 5 x sit to stand in <12 sec without hands but controlling descent for improved balance and functional mobility.    Baseline 22.22 seconds, decr eccentric control; 12.14 seconds w/o use of UE, w decr eccentric control 03/26/21    Time 12    Period Weeks    Status Revised    Target Date 04/21/21      PT LONG TERM GOAL #4   Title Pt will perform TUG with RW in <16 sec for improved balance and functional mobility.    Baseline 23.75 seconds with RW, 18.54 seconds with RW 03/26/21    Time 12    Period Weeks    Status Revised    Target Date 04/21/21                   Plan - 04/16/21 1304     Clinical Impression  Statement Today's skilled session continued to focus on standing weight shifting with wide BOS and stepping strategies in forward/posterior directions. Focus on full weight shifting when stepping, esp getting heel down in the posterior direction. Discussed using a wide BOS staggered stance during standing at the countertop to assist with balance with pt's wife giving supervision and cues. Will continue to progress towards LTGs.    Personal Factors and Comorbidities Comorbidity 3+;Time since onset of injury/illness/exacerbation;Past/Current Experience;Behavior Pattern    Comorbidities PD (diagnosed 2012), CAD (s/p CABG 2006), LD, HTN, a fib, pacemaker, GERD, on anticoagulants    Examination-Activity Limitations Bed Mobility;Caring for Others;Toileting;Transfers;Stairs;Locomotion Level;Stand;Squat;Reach Overhead    Designer, fashion/clothing;Yard Work    Merchant [employer] officer Evolving/Moderate complexity    Rehab Potential Good    PT Frequency 2x / week    PT Duration 12 weeks    PT Treatment/Interventions ADLs/Self Care Home Management;DME Instruction;Therapeutic activities;Functional mobility training;Gait training;Stair  training;Therapeutic exercise;Balance training;Neuromuscular re-education;Patient/family education;Energy conservation;Vestibular    PT Next Visit Plan LTGs due next week and re-cert. review stepping strategies to HEP.  Pt is very hard of hearing even with hearing aids (need to wear clear mask). gait trianing with RW using red tband as big kicks/steps to the red. or try counting steps again vs. use of laser for gait?  standing foot clearance and weight shifting. seated PWR?    PT Home Exercise Plan [id number]    Consulted and Agree with Plan of Care Patient;Family member/caregiver    Family Member Consulted pt's [name]             Patient will benefit from skilled therapeutic intervention in order to  improve the following deficits and impairments:  Abnormal gait, Decreased activity tolerance, Decreased coordination, Decreased balance, Decreased endurance, Decreased knowledge of use of DME, Decreased safety awareness, Decreased mobility, Decreased strength, Difficulty walking, Impaired flexibility, Postural dysfunction  Visit Diagnosis: Unsteadiness on feet  Abnormal posture  Other symptoms and signs involving the nervous system  Muscle weakness (generalized)     Problem List Patient Active Problem List   Diagnosis Date Noted   HOH (hard of hearing) 12/18/2020   Personal history of colonic polyps 08/31/2016   Abnormality of gait 08/27/2014   Memory difficulty 08/27/2014   Atrial fibrillation ([place]) 09/08/2013   Cholelithiases 07/19/2013   Cholelithiasis 07/19/2013   Encounter for therapeutic drug monitoring 07/03/2013   Aortic valve replaced 04/05/2013   Parkinson disease ([place]) 09/27/2012   Orthostatic hypotension 04/01/2012   Pacemaker-Medtronic 03/29/2012   HEPATITIS A 06/04/2010   HYPERTHYROIDISM 06/04/2010   HYPERLIPIDEMIA 06/04/2010   Essential hypertension 06/04/2010   CAD 06/04/2010   AORTIC STENOSIS 06/04/2010   SICK SINUS SYNDROME 06/04/2010   HIATAL HERNIA 06/04/2010   SHORTNESS OF BREATH 06/04/2010   CHEST PAIN 06/04/2010    Arliss Journey, PT, DPT  04/16/2021, 1:06 PM  [facility] [address] [place], [place], [address] Phone: [phone]   Fax:  [phone]  Name: ELIJAHJAMES FUELLING MRN: [id number] Date of Birth: November 07, 1940

## 2021-04-16 NOTE — Therapy (Signed)
[facility] [address] [place], [place], [address] Phone: [phone]   Fax:  [phone]  Speech Language Pathology Treatment  Patient Details  Name: Donald [name] MRN: [id number] Date of Birth: April 24, 1941 Referring Provider (SLP): Dr. Wells Guiles Tat   Encounter Date: 04/16/2021   End of Session - 04/16/21 1752     Visit Number 7    Number of Visits 25    Date for SLP Re-Evaluation 05/23/21    SLP Start Time 0845    SLP Stop Time  0925    SLP Time Calculation (min) 40 min             Past Medical History:  Diagnosis Date   Abnormality of gait 08/27/2014   Anxiety    Aortic stenosis    s/p AVR by Dr Cyndia Bent   Arthritis    left wrist- probable- not diagonised   Asthma    in the past   CAD (coronary artery disease)    s/p CABG 2006   GERD (gastroesophageal reflux disease)    H/O seasonal allergies    H/O: rheumatic fever    Hearing deficit    Bilateral hearing aids   Hematuria    had CT scan   Hiatal hernia    History of kidney stones    HOH (hard of hearing)    Hyperlipidemia    Hypertension    Hyperthyroidism    Inguinal hernia    right side; watching at this time per wife's report   Memory difficulty 08/27/2014   Pacemaker    Parkinson's disease    Persistent atrial fibrillation ([place])    Scarlet fever    as a child   Shortness of breath    Sick sinus syndrome ([place])    s/p PPM (MDT) 2006    Past Surgical History:  Procedure Laterality Date   AORTIC VALVE REPLACEMENT  2006   CARDIOVERSION N/A 09/14/2013   Procedure: CARDIOVERSION;  Surgeon: Sanda Klein, MD;  Location: [facility];  Service: Cardiovascular;  Laterality: N/A;   CHOLECYSTECTOMY N/A 07/21/2013   Procedure: LAPAROSCOPIC CHOLECYSTECTOMY;  Surgeon: Edward Jolly, MD;  Location: [facility];  Service: General;  Laterality: N/A;   COLONOSCOPY W/ POLYPECTOMY     COLONOSCOPY WITH PROPOFOL N/A 08/31/2016   Procedure: COLONOSCOPY  WITH PROPOFOL;  Surgeon: Wilford Corner, MD;  [facility] END[facility]COPY;  Service: Endoscopy;  Laterality: N/A;   CORONARY ARTERY BYPASS GRAFT  2006   DENTAL SURGERY     implants   INSERT / REPLACE / REMOVE PACEMAKER  2006, 2012   MDT for sick sinus syndrome, gen change 8/12 by [place]    There were no vitals filed for this visit.   Subjective Assessment - 04/16/21 0845     Subjective "It;s nasty outside    Patient is accompained by: Family member   [name]   Currently in Pain? No/denies                   ADULT SLP TREATMENT - 04/16/21 0852       General Information   Behavior/Cognition Alert;Cooperative;Requires cueing;Hard of hearing      Treatment Provided   Treatment provided Cognitive-Linquistic      Cognitive-Linquistic Treatment   Treatment focused on Dysarthria;Patient/family/caregiver education    Skilled Treatment Pt enters room with sub WNL volume, loud AH to recalibrate volume with average of 90dB with rare min A for breath support. In  oral reading task, Donald [name] averaged 73dB with verbal cues to talk on his breath, rather than exhaling then talking, 25/25 sentneces. In structured task generating 3 sentence description with average of 72dB with usual min visual cues for volume and breath, In 7 minute simple conversation, [name] averaged 70dB telling about his trip to [place], again with visual cues for volume. Donald [name] benefitted from verbal cues to swallow before speaking thorughout the session due to wet voice and slight drool      Progression Toward Goals   Progression toward goals Progressing toward goals              SLP Education - 04/16/21 0931     Education Details breath support for speech, swallow before talking              SLP Short Term Goals - 04/16/21 1751       SLP SHORT TERM GOAL #1   Title Pt will average 85dB on loud /a/ with occasional min A over 2 sessions.    Baseline 04/16/21    Time 4    Period  Weeks    Status On-going      SLP SHORT TERM GOAL #2   Title Pt will average 68dB on 18/20 sentences with occasional min A    Baseline 04/16/21    Time 4    Period Weeks    Status On-going      SLP SHORT TERM GOAL #3   Title Pt/spouse will follow 2 diet modificiations with occasional min A over 3 sessions    Baseline 04/14/21    Time 4    Period Weeks    Status On-going      SLP SHORT TERM GOAL #4   Title Pt will complete HEP for dysphagia with usual min  visual and verbal cues over 2 sessions    Time 5    Period Weeks    Status Achieved      SLP SHORT TERM GOAL #5   Title Pt will average 68dB over 5 minute conversation with occasional min A over 2 sessions    Baseline 04/14/21    Time 4    Period Weeks    Status On-going              SLP Long Term Goals - 04/16/21 1752       SLP LONG TERM GOAL #1   Title Pt will average 85dB on loud /a/ with rare min A over 2 sessions    Baseline 04/16/21    Time 10    Period Weeks    Status On-going      SLP LONG TERM GOAL #2   Title Pt will average 68dB over 12 minute conversation with occasional min A    Time 10    Period Weeks    Status On-going      SLP LONG TERM GOAL #3   Title Pt will complete HEP for dysarthria and dysphagia with occasional min A over 3 sessions    Time 10    Period Weeks    Status On-going      SLP LONG TERM GOAL #4   Title Pt will follow swallow precautions with occasional min A over 3 sessions    Baseline 04/14/21;    Time 10    Period Weeks    Status On-going      SLP LONG TERM GOAL #5   Title Pt/spouse will verbalize 4 s/s of aspiration pna with rare min A  Time 10    Period Weeks    Status On-going              Plan - 04/16/21 1751     Clinical Impression Statement Ongoing training of HEP for dysphagia and swallow precautions. They traveled for [place] ing so  HEP for dysarthria and CTAR were not done over the holiday. [name] and [name] report diffculty   communicating due to Donald [name]'s low volume and running his words together. [name] requires mod A to maintain volume in 2-3 word responses in structured tasks and simple conversation. Donald [name] follows swallow precautions withsupervision cues in therapy, and inconsistently at home per spouse. Virgel Manifold education re: s/s of aspiration pna. Continue skilled ST to maximize intelligilbity and safety fo swallow.    Speech Therapy Frequency 2x / week    Treatment/Interventions Aspiration precaution training;Language facilitation;Environmental controls;Cueing hierarchy;SLP instruction and feedback;Pharyngeal strengthening exercises;Compensatory techniques;Cognitive reorganization;Functional tasks;Compensatory strategies;Patient/family education;Multimodal communcation approach;Internal/external aids;Trials of upgraded texture/liquids;Diet toleration management by SLP;Other (comment)             Patient will benefit from skilled therapeutic intervention in order to improve the following deficits and impairments:   Dysarthria and anarthria  Dysphagia, oropharyngeal phase    Problem List Patient Active Problem List   Diagnosis Date Noted   HOH (hard of hearing) 12/18/2020   Personal history of colonic polyps 08/31/2016   Abnormality of gait 08/27/2014   Memory difficulty 08/27/2014   Atrial fibrillation ([place]) 09/08/2013   Cholelithiases 07/19/2013   Cholelithiasis 07/19/2013   Encounter for therapeutic drug monitoring 07/03/2013   Aortic valve replaced 04/05/2013   Parkinson disease ([place]) 09/27/2012   Orthostatic hypotension 04/01/2012   Pacemaker-Medtronic 03/29/2012   HEPATITIS A 06/04/2010   HYPERTHYROIDISM 06/04/2010   HYPERLIPIDEMIA 06/04/2010   Essential hypertension 06/04/2010   CAD 06/04/2010   AORTIC STENOSIS 06/04/2010   SICK SINUS SYNDROME 06/04/2010   HIATAL HERNIA 06/04/2010   SHORTNESS OF BREATH 06/04/2010   CHEST PAIN 06/04/2010    Risa Auman, Annye Rusk,  Nina 04/16/2021, 5:53 PM  [facility] [address] [place], [place], [address] Phone: [phone]   Fax:  [phone]   Name: Donald [name] MRN: [id number] Date of Birth: [birth year]

## 2021-04-16 NOTE — Therapy (Signed)
[facility] [address] [place], [place], [address] Phone: [phone]   Fax:  [phone]  Occupational Therapy Treatment  Patient Details  Name: Donald [name] MRN: [id number] Date of Birth: 1940-06-20 Referring Provider (OT): Dr. Wells Guiles Tat   Encounter Date: 04/16/2021   OT End of Session - 04/16/21 1347     Visit Number 10    Number of Visits 17    Date for OT Re-Evaluation 04/26/21    Authorization Type [employer], follow Medicare guidelines, copay per day    Authorization - Visit Number 10    Authorization - Number of Visits 10    Progress Note Due on Visit 10    OT Start Time [id number]    OT Stop Time 1015    OT Time Calculation (min) 41 min             Past Medical History:  Diagnosis Date   Abnormality of gait 08/27/2014   Anxiety    Aortic stenosis    s/p AVR by Dr Cyndia Bent   Arthritis    left wrist- probable- not diagonised   Asthma    in the past   CAD (coronary artery disease)    s/p CABG 2006   GERD (gastroesophageal reflux disease)    H/O seasonal allergies    H/O: rheumatic fever    Hearing deficit    Bilateral hearing aids   Hematuria    had CT scan   Hiatal hernia    History of kidney stones    HOH (hard of hearing)    Hyperlipidemia    Hypertension    Hyperthyroidism    Inguinal hernia    right side; watching at this time per wife's report   Memory difficulty 08/27/2014   Pacemaker    Parkinson's disease    Persistent atrial fibrillation ([place])    Scarlet fever    as a child   Shortness of breath    Sick sinus syndrome ([place])    s/p PPM (MDT) 2006    Past Surgical History:  Procedure Laterality Date   AORTIC VALVE REPLACEMENT  2006   CARDIOVERSION N/A 09/14/2013   Procedure: CARDIOVERSION;  Surgeon: Sanda Klein, MD;  Location: [facility];  Service: Cardiovascular;  Laterality: N/A;   CHOLECYSTECTOMY N/A 07/21/2013   Procedure: LAPAROSCOPIC CHOLECYSTECTOMY;   Surgeon: Edward Jolly, MD;  Location: [facility] OR;  Service: General;  Laterality: N/A;   COLONOSCOPY W/ POLYPECTOMY     COLONOSCOPY WITH PROPOFOL N/A 08/31/2016   Procedure: COLONOSCOPY WITH PROPOFOL;  Surgeon: Wilford Corner, MD;  Location: [facility] ENDOSCOPY;  Service: Endoscopy;  Laterality: N/A;   CORONARY ARTERY BYPASS GRAFT  2006   DENTAL SURGERY     implants   INSERT / REPLACE / REMOVE PACEMAKER  2006, 2012   MDT for sick sinus syndrome, gen change 8/12 by [place]    There were no vitals filed for this visit.  Pain: Pt denies pain   Treatment: Reviewed shoulder flexion exercise with bal reach for floor then overhead, min v.c and demonstration, cueing to follow ball with eyes. seated at table functional reaching to place large to small pegs in pegboard with facilitation to avoid compensatory shoulder abduction, and cueing facilitation for in hand manipulation and finger extension and supination following each peg, mod facilitation/ verbal and tactile cues Standing to perform functional reaching with left and right UE's, with feet apart and staggered, min facilitation/ v.c for  positioning and posture, cueing to watch hand with reach                       OT Short Term Goals - 04/14/21 0943       OT SHORT TERM GOAL #1   Title Pt will perform PD-specific HEP with cueing prn from caregiver--check STGs 03/27/21    Time 4    Period Weeks    Status Achieved      OT SHORT TERM GOAL #2   Title Pt will improve R hand coordination for ADLs (buttoning/tying shoes) as shown by completing 9-hole peg test in 75sec or less.    Baseline 88.31sec    Time 4    Period Weeks    Status On-going   04/14/21:  not met/>55mn     OT SHORT TERM GOAL #3   Title Pt will improve L hand coordination for ADLs (buttoning/tying shoes) as shown by completing 9-hole peg test in 90 sec or less.    Baseline 108.94sec    Time 4    Period Weeks    Status On-going    04/14/21:  253m 32.91sec     OT SHORT TERM GOAL #4   Title Pt/caregiver will verbalize understanding of ways to decr risk of future complcations related to PD (including falls).    Time 4    Period Weeks    Status On-going               OT Long Term Goals - 02/25/21 2128       OT LONG TERM GOAL #1   Title Pt/caregiver will verbalize understanding of AE/strategies to incr ease, safety, and independence with ADLs/IADLs.--check LTGs 04/26/21    Time 8    Period Weeks    Status New      OT LONG TERM GOAL #2   Title Pt will improve R hand coordination for ADLs (buttoning/tying shoes) as shown by completing 9-hole peg test in 65sec or less.    Time 8    Period Weeks    Status New      OT LONG TERM GOAL #3   Title Pt will improve L hand coordination for ADLs (buttoning/tying shoes) as shown by completing 9-hole peg test in 75 sec or less.    Time 8    Period Weeks    Status New      OT LONG TERM GOAL #4   Title Pt will improve functional reaching/coordination for ADLs as shown by improving score on box and blocks test by at least 5 with dominant LUE.    Baseline 32 blocks    Time 8    Period Weeks    Status New      OT LONG TERM GOAL #5   Title Pt/caregiver will verbalize understanding of appropriate community resources.    Time 8    Period Weeks    Status New                   Plan - 04/16/21 1349     Clinical Impression Statement For the reporting period of 02/25/21- 04/16/21, pt has made overall progress towards goals however he has not fully achieved goals due to the severity of his deficits. Pt is progressing towards remaining goals. Pt can benefit from continued skilled occupational therapy to address: decreased coordination, decreased balance, dyskinesias, cognitve deficits, visual deficits, and functional mobility in order to amximize pt's safety and I with daily activities.  OT Occupational Profile and History Detailed Assessment- Review of Records  and additional review of physical, cognitive, psychosocial history related to current functional performance    Occupational performance deficits (Please refer to evaluation for details): ADL's;IADL's;Leisure    Body Structure / Function / Physical Skills Balance;ADL;Decreased knowledge of use of DME;UE functional use;IADL;Improper spinal/pelvic alignment;Vision;Mobility;Coordination;Decreased knowledge of precautions;FMC    Rehab Potential Good    Clinical Decision Making Several treatment options, min-mod task modification necessary    Comorbidities Affecting Occupational Performance: May have comorbidities impacting occupational performance    Modification or Assistance to Complete Evaluation  Min-Moderate modification of tasks or assist with assess necessary to complete eval    OT Frequency 2x / week    OT Duration 8 weeks   +eval   OT Treatment/Interventions Self-care/ADL training;Moist Heat;DME and/or AE instruction;Therapeutic activities;Therapeutic exercise;Cognitive remediation/compensation;Visual/perceptual remediation/compensation;Passive range of motion;Functional Mobility Training;Neuromuscular education;Cryotherapy;Energy conservation;Manual Therapy;Patient/family education    Plan educate on ways to decr falls/future complications; reinforce functional mobility strategies as appropriate and timing of movement with large amplitude and avoiding shoulder compensation with coordination    Consulted and Agree with Plan of Care Patient;Family member/caregiver    Family Member Consulted wife             Patient will benefit from skilled therapeutic intervention in order to improve the following deficits and impairments:   Body Structure / Function / Physical Skills: Balance, ADL, Decreased knowledge of use of DME, UE functional use, IADL, Improper spinal/pelvic alignment, Vision, Mobility, Coordination, Decreased knowledge of precautions, [place]       Visit Diagnosis: Other lack of  coordination  Unsteadiness on feet  Abnormal posture  Other symptoms and signs involving the nervous system  Muscle weakness (generalized)  Visuospatial deficit    Problem List Patient Active Problem List   Diagnosis Date Noted   HOH (hard of hearing) 12/18/2020   Personal history of colonic polyps 08/31/2016   Abnormality of gait 08/27/2014   Memory difficulty 08/27/2014   Atrial fibrillation ([place]) 09/08/2013   Cholelithiases 07/19/2013   Cholelithiasis 07/19/2013   Encounter for therapeutic drug monitoring 07/03/2013   Aortic valve replaced 04/05/2013   Parkinson disease ([place]) 09/27/2012   Orthostatic hypotension 04/01/2012   Pacemaker-Medtronic 03/29/2012   HEPATITIS A 06/04/2010   HYPERTHYROIDISM 06/04/2010   HYPERLIPIDEMIA 06/04/2010   Essential hypertension 06/04/2010   CAD 06/04/2010   AORTIC STENOSIS 06/04/2010   SICK SINUS SYNDROME 06/04/2010   HIATAL HERNIA 06/04/2010   SHORTNESS OF BREATH 06/04/2010   CHEST PAIN 06/04/2010    Kharon Hixon, OT/L 04/16/2021, 1:52 PM Theone Murdoch, OTR/L [phone] Phone: [phone] 1:57 PM 04/16/21  [facility] [address] [place], [place], [address] Phone: [phone]   Fax:  [phone]  Name: Donald [name] MRN: [id number] Date of Birth: [birth year]

## 2021-04-18 ENCOUNTER — Other Ambulatory Visit: Payer: Self-pay | Admitting: *Deleted

## 2021-04-18 MED ORDER — NITROGLYCERIN 0.4 MG SL SUBL: SUBLINGUAL_TABLET | SUBLINGUAL | 3 refills | Status: DC

## 2021-04-21 ENCOUNTER — Encounter: Payer: Self-pay | Admitting: Physical Therapy

## 2021-04-21 ENCOUNTER — Encounter: Payer: Self-pay | Admitting: Speech Pathology

## 2021-04-21 ENCOUNTER — Ambulatory Visit: Payer: PPO | Admitting: Occupational Therapy

## 2021-04-21 ENCOUNTER — Other Ambulatory Visit: Payer: Self-pay

## 2021-04-21 ENCOUNTER — Ambulatory Visit: Payer: PPO | Attending: Internal Medicine | Admitting: Speech Pathology

## 2021-04-21 ENCOUNTER — Ambulatory Visit: Payer: PPO | Admitting: Physical Therapy

## 2021-04-21 ENCOUNTER — Encounter: Payer: Self-pay | Admitting: Occupational Therapy

## 2021-04-21 DIAGNOSIS — R293 Abnormal posture: Secondary | ICD-10-CM

## 2021-04-21 DIAGNOSIS — R2689 Other abnormalities of gait and mobility: Secondary | ICD-10-CM

## 2021-04-21 DIAGNOSIS — R2681 Unsteadiness on feet: Secondary | ICD-10-CM | POA: Insufficient documentation

## 2021-04-21 DIAGNOSIS — R41842 Visuospatial deficit: Secondary | ICD-10-CM | POA: Diagnosis not present

## 2021-04-21 DIAGNOSIS — M6281 Muscle weakness (generalized): Secondary | ICD-10-CM | POA: Insufficient documentation

## 2021-04-21 DIAGNOSIS — R1312 Dysphagia, oropharyngeal phase: Secondary | ICD-10-CM | POA: Insufficient documentation

## 2021-04-21 DIAGNOSIS — R278 Other lack of coordination: Secondary | ICD-10-CM | POA: Diagnosis not present

## 2021-04-21 DIAGNOSIS — R29818 Other symptoms and signs involving the nervous system: Secondary | ICD-10-CM | POA: Diagnosis not present

## 2021-04-21 DIAGNOSIS — R471 Dysarthria and anarthria: Secondary | ICD-10-CM | POA: Insufficient documentation

## 2021-04-21 DIAGNOSIS — R251 Tremor, unspecified: Secondary | ICD-10-CM | POA: Insufficient documentation

## 2021-04-21 NOTE — Therapy (Signed)
[facility] [address] [place], [place], [address] Phone: [phone]   Fax:  [phone]  Physical Therapy Treatment  Patient Details  Name: Donald [name] MRN: [id number] Date of Birth: [birth year] Referring Provider (PT): Dr. Carles Collet   Encounter Date: 04/21/2021   PT End of Session - 04/21/21 1026     Visit Number 16    Number of Visits 17    Date for PT Re-Evaluation 04/29/21    Authorization Type HTA - $30 co pay    PT Start Time 1021   pt running late from OT   PT Stop Time 1100    PT Time Calculation (min) 39 min    Equipment Utilized During Treatment Gait belt    Activity Tolerance Patient tolerated treatment well    Behavior During Therapy [facility] for tasks assessed/performed;Impulsive             Past Medical History:  Diagnosis Date   Abnormality of gait 08/27/2014   Anxiety    Aortic stenosis    s/p AVR by Dr Cyndia Bent   Arthritis    left wrist- probable- not diagonised   Asthma    in the past   CAD (coronary artery disease)    s/p CABG 2006   GERD (gastroesophageal reflux disease)    H/O seasonal allergies    H/O: rheumatic fever    Hearing deficit    Bilateral hearing aids   Hematuria    had CT scan   Hiatal hernia    History of kidney stones    HOH (hard of hearing)    Hyperlipidemia    Hypertension    Hyperthyroidism    Inguinal hernia    right side; watching at this time per wife's report   Memory difficulty 08/27/2014   Pacemaker    Parkinson's disease    Persistent atrial fibrillation ([place])    Scarlet fever    as a child   Shortness of breath    Sick sinus syndrome ([place])    s/p PPM (MDT) 2006    Past Surgical History:  Procedure Laterality Date   AORTIC VALVE REPLACEMENT  2006   CARDIOVERSION N/A 09/14/2013   Procedure: CARDIOVERSION;  Surgeon: Sanda Klein, MD;  Location: [facility] ENDOSCOPY;  Service: Cardiovascular;  Laterality: N/A;   CHOLECYSTECTOMY N/A 07/21/2013   Procedure:  LAPAROSCOPIC CHOLECYSTECTOMY;  Surgeon: Edward Jolly, MD;  Location: [facility];  Service: General;  Laterality: N/A;   COLONOSCOPY W/ POLYPECTOMY     COLONOSCOPY WITH PROPOFOL N/A 08/31/2016   Procedure: COLONOSCOPY WITH PROPOFOL;  Surgeon: Wilford Corner, MD;  Location: [facility] ENDOSCOPY;  Service: Endoscopy;  Laterality: N/A;   CORONARY ARTERY BYPASS GRAFT  2006   DENTAL SURGERY     implants   INSERT / REPLACE / REMOVE PACEMAKER  2006, 2012   MDT for sick sinus syndrome, gen change 8/12 by [place]    There were no vitals filed for this visit.   Subjective Assessment - 04/21/21 1025     Subjective No falls, exercises are going well at home.    Pertinent History PMH: PD, CAD (s/p CABG 2006), LD, HTN, a fib, pacemaker, GERD, on anticoagulants    Limitations Walking;House hold activities;Standing    Patient Stated Goals wants to improve his balance    Currently in Pain? No/denies  [facility] Adult PT Treatment/Exercise - 04/21/21 0001       Transfers   Transfers Sit to Stand;Stand to Sit    Sit to Stand 5: Supervision    Stand to Sit 5: Supervision    Comments x5 reps, reminder cues for full forward weight shift and scooting out towards the edge.      Ambulation/Gait   Ambulation/Gait Yes    Ambulation/Gait Assistance 4: Min guard    Ambulation/Gait Assistance Details With RW - trialed attaching a head lamp to front of RW and using a light target (medium sized circle) farther ahead than pt's red tband target for pt to look up more vs. down at the ground. With cues for pt to step to the light with pt able to demo incr stride length and heel strike better with this target vs. use of his red tband. No episodes of festination today, but did get more shuffled steps when going around turns and when in a more busy part of the therapy gym. Therapist needing assist at times to keep headlamp in place. Pt's spouse reports they  have a small flashlight that they could potentially attach, plan to bring it in to next session    Ambulation Distance (Feet) 230 Feet   x2, 115' x 2   Assistive device Rolling walker    Gait Pattern Step-through pattern;Decreased step length - right;Decreased step length - left    Ambulation Surface Level;Indoor                 Balance Exercises - 04/21/21 1100       Balance Exercises: Standing   SLS with Vectors Solid surface;Upper extremity assist 2;Limitations    SLS with Vectors Limitations Alternating SLS taps to 6" step to target x15 reps each leg - cues for slowed and controlled and foot clearance. Cues to keep stance leg all the way down on the floor vs. raising up onto heel.    Wall Bumps Hip;Eyes opened;10 reps   in // bars, needing verbal, demo and manual cues for proper technique/weight shift   Toe Raise Both;10 reps   needing cues for weight shift   Other Standing Exercises Standing at RW: kicking out to red tband target x10 reps each side with cues for slowed and controlled,froward stepping to targets x15 reps each side with cues for foot clerance/weight shifting, alternating marching to target x15 reps each side, cues for posture, SLS and slowed/controlled.                  PT Short Term Goals - 03/11/21 1117       PT SHORT TERM GOAL #1   Title Pt will perform initial HEP with supervision from pt's spouse for strength, balance, and transfers in order to build upon functional gains made in therapy. ALL STGS DUE 02/26/21    Time 4    Period Weeks    Status New    Target Date 02/26/21      PT SHORT TERM GOAL #2   Title Pt and pt's spouse will verbalize understanding of fall prevention in the home.    Baseline provided handout on 03/11/21    Time 4    Period Weeks    Status Achieved      PT SHORT TERM GOAL #3   Title Pt and pt's spouse will verbalize understanding of techniques to help with freezing/festination episodes in order to demo improved safety  with gait.    Baseline Reviewed today in  session on 02/25/21 - will continue to benefit from additional practice/repetition with wife practicing cues with pt    Time 4    Period Weeks    Status Partially Met      PT SHORT TERM GOAL #4   Title Pt will perform TUG with RW vs. UStep Walker in 21 seconds or less in order to demo decr fall risk.    Baseline 23.75 seconds, 51 seconds first attempt 42 seconds 2nd attempt    Time 4    Period Weeks    Status Not Met      PT SHORT TERM GOAL #5   Title Pt will perform 5 reps of sit <> stand with BUE support with proper technique and without BLE bracing in order to demo improved safety with functional transfers.    Baseline met on 02/25/21    Time 4    Period Weeks    Status Achieved               PT Long Term Goals - 03/26/21 1413       PT LONG TERM GOAL #1   Title Pt will perform final HEP with supervision from pt's spouse for strength, balance, and transfers in order to build upon functional gains made in therapy. ALL LTGS DUE 04/21/21    Baseline 03/26/21 PT continues to add to HEP    Time 12    Period Weeks    Status On-going    Target Date 04/21/21      PT LONG TERM GOAL #2   Title Pt will ambulate >500' with RW supervision on varied level surfaces for improved household and short community distances.    Time 12    Period Weeks    Status Revised    Target Date 04/21/21      PT LONG TERM GOAL #3   Title Pt will be able to perform 5 x sit to stand in <12 sec without hands but controlling descent for improved balance and functional mobility.    Baseline 22.22 seconds, decr eccentric control; 12.14 seconds w/o use of UE, w decr eccentric control 03/26/21    Time 12    Period Weeks    Status Revised    Target Date 04/21/21      PT LONG TERM GOAL #4   Title Pt will perform TUG with RW in <16 sec for improved balance and functional mobility.    Baseline 23.75 seconds with RW, 18.54 seconds with RW 03/26/21    Time 12    Period  Weeks    Status Revised    Target Date 04/21/21                   Plan - 04/21/21 1217     Clinical Impression Statement Trialed attaching headlamp with flashlight to front of pt's RW with cue to step towards (farther out then red tband so pt is looking more upwards vs. down), and pt able to take longer steps with incr stride length/heel strike better than with just use of tband. With a busy environment pt does continue to take smaller steps and needs cues for stride length. Discussed with wife potentially attaching a small flashlight to RW. Pt's spouse to bring one in next time. Will continue to progress towards LTGs.    Personal Factors and Comorbidities Comorbidity 3+;Time since onset of injury/illness/exacerbation;Past/Current Experience;Behavior Pattern    Comorbidities PD (diagnosed 2012), CAD (s/p CABG 2006), LD, HTN, a fib, pacemaker, GERD, on  anticoagulants    Examination-Activity Limitations Bed Mobility;Caring for Others;Toileting;Transfers;Stairs;Locomotion Level;Stand;Squat;Reach Overhead    Designer, fashion/clothing;Yard Work    Merchant [employer] officer Evolving/Moderate complexity    Rehab Potential Good    PT Frequency 2x / week    PT Duration 12 weeks    PT Treatment/Interventions ADLs/Self Care Home Management;DME Instruction;Therapeutic activities;Functional mobility training;Gait training;Stair training;Therapeutic exercise;Balance training;Neuromuscular re-education;Patient/family education;Energy conservation;Vestibular    PT Next Visit Plan LTGs dueand recert. review stepping strategies to HEP.  Pt is very hard of hearing even with hearing aids (need to wear clear mask). gait trianing with RW using red tband as big kicks/steps to the red. try again attaching head lamp/laser for incr stride length.  standing foot clearance and weight shifting. seated PWR?    PT Home Exercise Plan [id number]     Consulted and Agree with Plan of Care Patient;Family member/caregiver    Family Member Consulted pt's [name]             Patient will benefit from skilled therapeutic intervention in order to improve the following deficits and impairments:  Abnormal gait, Decreased activity tolerance, Decreased coordination, Decreased balance, Decreased endurance, Decreased knowledge of use of DME, Decreased safety awareness, Decreased mobility, Decreased strength, Difficulty walking, Impaired flexibility, Postural dysfunction  Visit Diagnosis: Other abnormalities of gait and mobility  Other lack of coordination  Muscle weakness (generalized)  Unsteadiness on feet     Problem List Patient Active Problem List   Diagnosis Date Noted   HOH (hard of hearing) 12/18/2020   Personal history of colonic polyps 08/31/2016   Abnormality of gait 08/27/2014   Memory difficulty 08/27/2014   Atrial fibrillation ([place]) 09/08/2013   Cholelithiases 07/19/2013   Cholelithiasis 07/19/2013   Encounter for therapeutic drug monitoring 07/03/2013   Aortic valve replaced 04/05/2013   Parkinson disease ([place]) 09/27/2012   Orthostatic hypotension 04/01/2012   Pacemaker-Medtronic 03/29/2012   HEPATITIS A 06/04/2010   HYPERTHYROIDISM 06/04/2010   HYPERLIPIDEMIA 06/04/2010   Essential hypertension 06/04/2010   CAD 06/04/2010   AORTIC STENOSIS 06/04/2010   SICK SINUS SYNDROME 06/04/2010   HIATAL HERNIA 06/04/2010   SHORTNESS OF BREATH 06/04/2010   CHEST PAIN 06/04/2010    Arliss Journey, PT, DPT  04/21/2021, 12:25 PM  [facility] [address] [place], [place], [address] Phone: [phone]   Fax:  [phone]  Name: OLAF MESA MRN: [id number] Date of Birth: 03-19-1941

## 2021-04-21 NOTE — Therapy (Signed)
[facility] [address] [place], [place], [address] Phone: [phone]   Fax:  [phone]  Occupational Therapy Treatment  Patient Details  Name: Donald [name] MRN: [id number] Date of Birth: 14-Dec-1940 Referring Provider (OT): Dr. Wells Guiles Tat   Encounter Date: 04/21/2021   OT End of Session - 04/21/21 0954     Visit Number 11    Number of Visits 17    Date for OT Re-Evaluation 04/26/21    Authorization Type [employer], follow Medicare guidelines, copay per day    Authorization - Visit Number 11    Authorization - Number of Visits 20    Progress Note Due on Visit 20    OT Start Time 0934    OT Stop Time 1015    OT Time Calculation (min) 41 min    Activity Tolerance Patient tolerated treatment well    Behavior During Therapy [facility] for tasks assessed/performed;Impulsive             Past Medical History:  Diagnosis Date   Abnormality of gait 08/27/2014   Anxiety    Aortic stenosis    s/p AVR by Dr Cyndia Bent   Arthritis    left wrist- probable- not diagonised   Asthma    in the past   CAD (coronary artery disease)    s/p CABG 2006   GERD (gastroesophageal reflux disease)    H/O seasonal allergies    H/O: rheumatic fever    Hearing deficit    Bilateral hearing aids   Hematuria    had CT scan   Hiatal hernia    History of kidney stones    HOH (hard of hearing)    Hyperlipidemia    Hypertension    Hyperthyroidism    Inguinal hernia    right side; watching at this time per wife's report   Memory difficulty 08/27/2014   Pacemaker    Parkinson's disease    Persistent atrial fibrillation ([place])    Scarlet fever    as a child   Shortness of breath    Sick sinus syndrome ([place])    s/p PPM (MDT) 2006    Past Surgical History:  Procedure Laterality Date   AORTIC VALVE REPLACEMENT  2006   CARDIOVERSION N/A 09/14/2013   Procedure: CARDIOVERSION;  Surgeon: Sanda Klein, MD;  Location: [facility]  ENDOSCOPY;  Service: Cardiovascular;  Laterality: N/A;   CHOLECYSTECTOMY N/A 07/21/2013   Procedure: LAPAROSCOPIC CHOLECYSTECTOMY;  Surgeon: Edward Jolly, MD;  Location: [facility];  Service: General;  Laterality: N/A;   COLONOSCOPY W/ POLYPECTOMY     COLONOSCOPY WITH PROPOFOL N/A 08/31/2016   Procedure: COLONOSCOPY WITH PROPOFOL;  Surgeon: Wilford Corner, MD;  Location: [facility] ENDOSCOPY;  Service: Endoscopy;  Laterality: N/A;   CORONARY ARTERY BYPASS GRAFT  2006   DENTAL SURGERY     implants   INSERT / REPLACE / REMOVE PACEMAKER  2006, 2012   MDT for sick sinus syndrome, gen change 8/12 by [place]    There were no vitals filed for this visit.   Subjective Assessment - 04/21/21 0934     Subjective  no new falls    Pertinent History Parkinson's Disease.   PMH:  hx of falls (wears helment), arthritis, asthma, CAD, GERD, HOH, HLD, HTN, hyperthyroidism, anxiety, aortic stenosis, hx of aortic valve replacement 2006, memory difficulty, pacemaker, persistent a-fib, hx of scarlet fever, shortness of breath, hx of CABG 2006, cholecystectomy 2015, neuropathy  Limitations fall risk (wears helment and pads), freezing, hard of hearing--reads lips (**wear clear mask)    Patient Stated Goals improve coordination    Currently in Pain? No/denies               Placing various sized pegs in arc pegboard with each UE with min-mod cueing for shoulder compensation and for tip pinch (particularly with L hand).  Pt with improved in-hand manipulation, but incr difficulty (particularly with L hand for smallest pegs.  Fastening zipper on jacket for bilateral hand use with min difficulty.  Fastening buttons with incr time.  Pt able to fasten 2 medium buttons with significant difficulty/incr time and mod cueing for hand placement.  Pt able to fasten 3 large buttons with mod difficulty/incr time.  Wife reports that pt is having difficulty with putting in hearing aide and wife has been  having to help for last couple weeks (new).  Pt was able to remove hearing aide and then don with mirror today in clinic with mod difficulty/incr time (multiple attempts).  Discussed possible timing with meds.  Pt did demo significant difficulty and needed assist to re-fasten helmet buckle (able to doff without assist).        OT Short Term Goals - 04/14/21 0943       OT SHORT TERM GOAL #1   Title Pt will perform PD-specific HEP with cueing prn from caregiver--check STGs 03/27/21    Time 4    Period Weeks    Status Achieved      OT SHORT TERM GOAL #2   Title Pt will improve R hand coordination for ADLs (buttoning/tying shoes) as shown by completing 9-hole peg test in 75sec or less.    Baseline 88.31sec    Time 4    Period Weeks    Status On-going   04/14/21:  not met/>21mn     OT SHORT TERM GOAL #3   Title Pt will improve L hand coordination for ADLs (buttoning/tying shoes) as shown by completing 9-hole peg test in 90 sec or less.    Baseline 108.94sec    Time 4    Period Weeks    Status On-going   04/14/21:  2102m 32.91sec     OT SHORT TERM GOAL #4   Title Pt/caregiver will verbalize understanding of ways to decr risk of future complcations related to PD (including falls).    Time 4    Period Weeks    Status On-going               OT Long Term Goals - 02/25/21 2128       OT LONG TERM GOAL #1   Title Pt/caregiver will verbalize understanding of AE/strategies to incr ease, safety, and independence with ADLs/IADLs.--check LTGs 04/26/21    Time 8    Period Weeks    Status New      OT LONG TERM GOAL #2   Title Pt will improve R hand coordination for ADLs (buttoning/tying shoes) as shown by completing 9-hole peg test in 65sec or less.    Time 8    Period Weeks    Status New      OT LONG TERM GOAL #3   Title Pt will improve L hand coordination for ADLs (buttoning/tying shoes) as shown by completing 9-hole peg test in 75 sec or less.    Time 8    Period Weeks     Status New      OT LONG TERM GOAL #4  Title Pt will improve functional reaching/coordination for ADLs as shown by improving score on box and blocks test by at least 5 with dominant LUE.    Baseline 32 blocks    Time 8    Period Weeks    Status New      OT LONG TERM GOAL #5   Title Pt/caregiver will verbalize understanding of appropriate community resources.    Time 8    Period Weeks    Status New                   Plan - 04/21/21 1422     Clinical Impression Statement Pt is slowly progressing towards goals with improving coordination/in-hand manipulation and decr shoulder compensation.    OT Occupational Profile and History Detailed Assessment- Review of Records and additional review of physical, cognitive, psychosocial history related to current functional performance    Occupational performance deficits (Please refer to evaluation for details): ADL's;IADL's;Leisure    Body Structure / Function / Physical Skills Balance;ADL;Decreased knowledge of use of DME;UE functional use;IADL;Improper spinal/pelvic alignment;Vision;Mobility;Coordination;Decreased knowledge of precautions;FMC    Rehab Potential Good    Clinical Decision Making Several treatment options, min-mod task modification necessary    Comorbidities Affecting Occupational Performance: May have comorbidities impacting occupational performance    Modification or Assistance to Complete Evaluation  Min-Moderate modification of tasks or assist with assess necessary to complete eval    OT Frequency 2x / week    OT Duration 8 weeks   +eval   OT Treatment/Interventions Self-care/ADL training;Moist Heat;DME and/or AE instruction;Therapeutic activities;Therapeutic exercise;Cognitive remediation/compensation;Visual/perceptual remediation/compensation;Passive range of motion;Functional Mobility Training;Neuromuscular education;Cryotherapy;Energy conservation;Manual Therapy;Patient/family education    Plan educate on ways to decr  falls/future complications; reinforce functional mobility strategies as appropriate and timing of movement with large amplitude and avoiding shoulder compensation with coordination    Consulted and Agree with Plan of Care Patient;Family member/caregiver    Family Member Consulted wife             Patient will benefit from skilled therapeutic intervention in order to improve the following deficits and impairments:   Body Structure / Function / Physical Skills: Balance, ADL, Decreased knowledge of use of DME, UE functional use, IADL, Improper spinal/pelvic alignment, Vision, Mobility, Coordination, Decreased knowledge of precautions, [place]       Visit Diagnosis: Other symptoms and signs involving the nervous system  Other lack of coordination  Visuospatial deficit  Tremor  Unsteadiness on feet  Abnormal posture    Problem List Patient Active Problem List   Diagnosis Date Noted   HOH (hard of hearing) 12/18/2020   Personal history of colonic polyps 08/31/2016   Abnormality of gait 08/27/2014   Memory difficulty 08/27/2014   Atrial fibrillation ([place]) 09/08/2013   Cholelithiases 07/19/2013   Cholelithiasis 07/19/2013   Encounter for therapeutic drug monitoring 07/03/2013   Aortic valve replaced 04/05/2013   Parkinson disease ([place]) 09/27/2012   Orthostatic hypotension 04/01/2012   Pacemaker-Medtronic 03/29/2012   HEPATITIS A 06/04/2010   HYPERTHYROIDISM 06/04/2010   HYPERLIPIDEMIA 06/04/2010   Essential hypertension 06/04/2010   CAD 06/04/2010   AORTIC STENOSIS 06/04/2010   SICK SINUS SYNDROME 06/04/2010   HIATAL HERNIA 06/04/2010   SHORTNESS OF BREATH 06/04/2010   CHEST PAIN 06/04/2010    Vianne Bulls, OT 04/21/2021, 2:24 PM  [facility] [address] [place], [place], [address] Phone: [phone]   Fax:  [phone]  Name: LINDSAY SOULLIERE MRN: [id number] Date of Birth: 1940/07/13  Levada Dy  Domingo Cocking, OTR/L [facility] [address] [place], [place]  [address] [phone] phone [phone] 04/21/21 2:24 PM

## 2021-04-21 NOTE — Therapy (Signed)
[facility] [address] [place], [place], [address] Phone: [phone]   Fax:  [phone]  Speech Language Pathology Treatment  Patient Details  Name: Donald [name] MRN: [id number] Date of Birth: 1941/04/29 Referring Provider (SLP): Dr. Lurena Joiner Tat   Encounter Date: 04/21/2021   End of Session - 04/21/21 0932     Visit Number 8    Number of Visits 25    Date for SLP Re-Evaluation 05/23/21    SLP Start Time 0845    SLP Stop Time  0928    SLP Time Calculation (min) 43 min             Past Medical History:  Diagnosis Date   Abnormality of gait 08/27/2014   Anxiety    Aortic stenosis    s/p AVR by Dr Laneta Simmers   Arthritis    left wrist- probable- not diagonised   Asthma    in the past   CAD (coronary artery disease)    s/p CABG 2006   GERD (gastroesophageal reflux disease)    H/O seasonal allergies    H/O: rheumatic fever    Hearing deficit    Bilateral hearing aids   Hematuria    had CT scan   Hiatal hernia    History of kidney stones    HOH (hard of hearing)    Hyperlipidemia    Hypertension    Hyperthyroidism    Inguinal hernia    right side; watching at this time per wife's report   Memory difficulty 08/27/2014   Pacemaker    Parkinson's disease    Persistent atrial fibrillation ([facility])    Scarlet fever    as a child   Shortness of breath    Sick sinus syndrome ([facility])    s/p PPM (MDT) 2006    Past Surgical History:  Procedure Laterality Date   AORTIC VALVE REPLACEMENT  2006   CARDIOVERSION N/A 09/14/2013   Procedure: CARDIOVERSION;  Surgeon: Thurmon Fair, MD;  Location: [facility] ENDOSCOPY;  Service: Cardiovascular;  Laterality: N/A;   CHOLECYSTECTOMY N/A 07/21/2013   Procedure: LAPAROSCOPIC CHOLECYSTECTOMY;  Surgeon: Mariella Saa, MD;  Location: [facility] OR;  Service: General;  Laterality: N/A;   COLONOSCOPY W/ POLYPECTOMY     COLONOSCOPY WITH PROPOFOL N/A 08/31/2016   Procedure: COLONOSCOPY  WITH PROPOFOL;  Surgeon: Charlott Rakes, MD;  Location: [facility] ENDOSCOPY;  Service: Endoscopy;  Laterality: N/A;   CORONARY ARTERY BYPASS GRAFT  2006   DENTAL SURGERY     implants   INSERT / REPLACE / REMOVE PACEMAKER  2006, 2012   MDT for sick sinus syndrome, gen change 8/12 by [facility]   KIDNEY STONE SURGERY  1984    There were no vitals filed for this visit.   Subjective Assessment - 04/21/21 0854     Subjective "They lost twice ;this weekend and I though they would do better against [place]"    Patient is accompained by: Family member   [name]   Currently in Pain? No/denies                   ADULT SLP TREATMENT - 04/21/21 0855       General Information   Behavior/Cognition Alert;Cooperative;Requires cueing;Hard of hearing      Treatment Provided   Treatment provided Cognitive-Linquistic      Dysphagia Treatment   Temperature Spikes Noted No    Respiratory Status Room air    Oral Cavity - Dentition Adequate natural dentition  Patient observed directly with PO's Yes    Type of PO's observed Thin liquids   pills   Liquids provided via Cup    Pharyngeal Phase Signs & Symptoms Wet vocal quality;Audible swallow    Type of cueing Verbal    Amount of cueing Minimal    Other treatment/comments [name] verbalized swallow precautions with questioning cues. He demonstrated HEP with occasional min A, extended time for [facility]. He took pills one at a time with mod I.      Cognitive-Linquistic Treatment   Treatment focused on Dysarthria;Patient/family/caregiver education    Skilled Treatment Donald [name] enters the room with averageof 68-69dB. Loud Ah to recalibrate voluem averated 90dB with mod I. Oral reading average 72dB with occasional min visual and verbal cues 20/20 sentences. In structured task describing problems and solutions with average of 70dB with occasional min A 15/15 sentences. In simple conversation, Donald [name] averaged 71dB with usual min over 6 mintues with familiar topic       Assessment / Recommendations / Plan   Plan Continue with current plan of care      Progression Toward Goals   Progression toward goals Progressing toward goals              SLP Education - 04/21/21 0926     Education Details continue HEP    Person(s) Educated Patient;Spouse    Methods Explanation;Demonstration;Verbal cues    Comprehension Verbalized understanding;Returned demonstration;Verbal cues required              SLP Short Term Goals - 04/21/21 0929       SLP SHORT TERM GOAL #1   Title Pt will average 85dB on loud /a/ with occasional min A over 2 sessions.    Baseline 04/16/21, 04/21/21    Time 4    Period Weeks    Status Achieved      SLP SHORT TERM GOAL #2   Title Pt will average 68dB on 18/20 sentences with occasional min A    Baseline 04/16/21    Time 4    Period Weeks    Status Achieved      SLP SHORT TERM GOAL #3   Title Pt/spouse will follow 2 diet modificiations with occasional min A over 3 sessions    Baseline 04/14/21    Time 3    Period Weeks    Status On-going      SLP SHORT TERM GOAL #4   Title Pt will complete HEP for dysphagia with usual min  visual and verbal cues over 2 sessions    Time 5    Period Weeks    Status Achieved      SLP SHORT TERM GOAL #5   Title Pt will average 68dB over 5 minute conversation with occasional min A over 2 sessions    Baseline 04/14/21, 04/21/21    Time 4    Period Weeks    Status Achieved              SLP Long Term Goals - 04/21/21 0932       SLP LONG TERM GOAL #1   Title Pt will average 85dB on loud /a/ with rare min A over 2 sessions    Baseline 04/16/21    Time 10    Period Weeks    Status Achieved      SLP LONG TERM GOAL #2   Title Pt will average 68dB over 12 minute conversation with occasional min A    Time 9  Period Weeks    Status On-going      SLP LONG TERM GOAL #3   Title Pt will complete HEP for dysarthria and dysphagia with occasional min A over 3 sessions     Time 9    Period Weeks    Status On-going      SLP LONG TERM GOAL #4   Title Pt will follow swallow precautions with occasional min A over 3 sessions    Baseline 04/14/21;    Time 9    Period Weeks    Status On-going      SLP LONG TERM GOAL #5   Title Pt/spouse will verbalize 4 s/s of aspiration pna with rare min A    Time 9    Period Weeks    Status On-going              Plan - 04/21/21 0926     Clinical Impression Statement Rare to Occasional min HEP for dysphagia and swallow precautions.[name] and [name] report diffculty  communicating due to Donald [name]'s low volume and running his words together. [place] requires min A to maintain volume and simple conversation. Donald [name] follows swallow precautions withsupervision cues in therapy, and inconsistently at home per spouse. Virgel Manifold education re: s/s of aspiration pna. Continue skilled ST to maximize intelligilbity and safety fo swallow.    Speech Therapy Frequency 2x / week    Duration 12 weeks    Treatment/Interventions Language facilitation;Environmental controls;Cueing hierarchy;SLP instruction and feedback;Pharyngeal strengthening exercises;Compensatory techniques;Cognitive reorganization;Functional tasks;Compensatory strategies;Patient/family education;Multimodal communcation approach;Internal/external aids;Trials of upgraded texture/liquids;Diet toleration management by SLP;Other (comment)    Potential to Achieve Goals Fair    Potential Considerations Medical prognosis;Severity of impairments;Previous level of function             Patient will benefit from skilled therapeutic intervention in order to improve the following deficits and impairments:   Dysarthria and anarthria  Dysphagia, oropharyngeal phase    Problem List Patient Active Problem List   Diagnosis Date Noted   HOH (hard of hearing) 12/18/2020   Personal history of colonic polyps 08/31/2016   Abnormality of gait 08/27/2014   Memory difficulty  08/27/2014   Atrial fibrillation ([place]) 09/08/2013   Cholelithiases 07/19/2013   Cholelithiasis 07/19/2013   Encounter for therapeutic drug monitoring 07/03/2013   Aortic valve replaced 04/05/2013   Parkinson disease ([place]) 09/27/2012   Orthostatic hypotension 04/01/2012   Pacemaker-Medtronic 03/29/2012   HEPATITIS A 06/04/2010   HYPERTHYROIDISM 06/04/2010   HYPERLIPIDEMIA 06/04/2010   Essential hypertension 06/04/2010   CAD 06/04/2010   AORTIC STENOSIS 06/04/2010   SICK SINUS SYNDROME 06/04/2010   HIATAL HERNIA 06/04/2010   SHORTNESS OF BREATH 06/04/2010   CHEST PAIN 06/04/2010    Chey Cho, Annye Rusk, CCC-SLP 04/21/2021, 11:50 AM  [facility] [address] [place], [place], [address] Phone: [phone]   Fax:  [phone]   Name: Donald [name] MRN: [id number] Date of Birth: 1941/01/01

## 2021-04-23 ENCOUNTER — Encounter: Payer: Self-pay | Admitting: Speech Pathology

## 2021-04-23 ENCOUNTER — Ambulatory Visit: Payer: PPO | Admitting: Physical Therapy

## 2021-04-23 ENCOUNTER — Ambulatory Visit: Payer: PPO | Admitting: Speech Pathology

## 2021-04-23 ENCOUNTER — Encounter: Payer: Self-pay | Admitting: Physical Therapy

## 2021-04-23 ENCOUNTER — Other Ambulatory Visit: Payer: Self-pay

## 2021-04-23 ENCOUNTER — Ambulatory Visit: Payer: PPO | Admitting: Occupational Therapy

## 2021-04-23 DIAGNOSIS — R29818 Other symptoms and signs involving the nervous system: Secondary | ICD-10-CM

## 2021-04-23 DIAGNOSIS — M6281 Muscle weakness (generalized): Secondary | ICD-10-CM

## 2021-04-23 DIAGNOSIS — R471 Dysarthria and anarthria: Secondary | ICD-10-CM

## 2021-04-23 DIAGNOSIS — R278 Other lack of coordination: Secondary | ICD-10-CM

## 2021-04-23 DIAGNOSIS — R2689 Other abnormalities of gait and mobility: Secondary | ICD-10-CM

## 2021-04-23 DIAGNOSIS — R2681 Unsteadiness on feet: Secondary | ICD-10-CM

## 2021-04-23 DIAGNOSIS — R1312 Dysphagia, oropharyngeal phase: Secondary | ICD-10-CM

## 2021-04-23 DIAGNOSIS — R41842 Visuospatial deficit: Secondary | ICD-10-CM

## 2021-04-23 DIAGNOSIS — R293 Abnormal posture: Secondary | ICD-10-CM

## 2021-04-23 NOTE — Therapy (Signed)
[facility] [address] [place], [place], [address] Phone: [phone]   Fax:  [phone]  Speech Language Pathology Treatment  Patient Details  Name: Donald [name] MRN: [id number] Date of Birth: 12/12/1940 Referring Provider (SLP): Dr. Lurena Joiner Tat   Encounter Date: 04/23/2021   End of Session - 04/23/21 0931     Visit Number 9    Number of Visits 25    Date for SLP Re-Evaluation 05/23/21             Past Medical History:  Diagnosis Date   Abnormality of gait 08/27/2014   Anxiety    Aortic stenosis    s/p AVR by Dr Laneta Simmers   Arthritis    left wrist- probable- not diagonised   Asthma    in the past   CAD (coronary artery disease)    s/p CABG 2006   GERD (gastroesophageal reflux disease)    H/O seasonal allergies    H/O: rheumatic fever    Hearing deficit    Bilateral hearing aids   Hematuria    had CT scan   Hiatal hernia    History of kidney stones    HOH (hard of hearing)    Hyperlipidemia    Hypertension    Hyperthyroidism    Inguinal hernia    right side; watching at this time per wife's report   Memory difficulty 08/27/2014   Pacemaker    Parkinson's disease    Persistent atrial fibrillation (HCC)    Scarlet fever    as a child   Shortness of breath    Sick sinus syndrome (HCC)    s/p PPM (MDT) 2006    Past Surgical History:  Procedure Laterality Date   AORTIC VALVE REPLACEMENT  2006   CARDIOVERSION N/A 09/14/2013   Procedure: CARDIOVERSION;  Surgeon: Thurmon Fair, MD;  Location: [facility] ENDOSCOPY;  Service: Cardiovascular;  Laterality: N/A;   CHOLECYSTECTOMY N/A 07/21/2013   Procedure: LAPAROSCOPIC CHOLECYSTECTOMY;  Surgeon: Mariella Saa, MD;  Location: [facility] OR;  Service: General;  Laterality: N/A;   COLONOSCOPY W/ POLYPECTOMY     COLONOSCOPY WITH PROPOFOL N/A 08/31/2016   Procedure: COLONOSCOPY WITH PROPOFOL;  Surgeon: Charlott Rakes, MD;  Location: [facility] ENDOSCOPY;  Service:  Endoscopy;  Laterality: N/A;   CORONARY ARTERY BYPASS GRAFT  2006   DENTAL SURGERY     implants   INSERT / REPLACE / REMOVE PACEMAKER  2006, 2012   MDT for sick sinus syndrome, gen change 8/12 by JA   KIDNEY STONE SURGERY  1984    There were no vitals filed for this visit.   Subjective Assessment - 04/23/21 0916     Subjective "We're good"    Patient is accompained by: Family member    Currently in Pain? No/denies                   ADULT SLP TREATMENT - 04/23/21 0918       General Information   Behavior/Cognition Alert;Cooperative;Requires cueing;Hard of hearing      Treatment Provided   Treatment provided Cognitive-Linquistic      Dysphagia Treatment   Temperature Spikes Noted No    Respiratory Status Room air    Oral Cavity - Dentition Adequate natural dentition    Treatment Methods Skilled observation;Therapeutic exercise;Compensation strategy training    Patient observed directly with PO's Yes    Type of PO's observed Thin liquids    Feeding Able to feed self    Liquids  provided via Cup    Oral Phase Signs & Symptoms Anterior loss/spillage    Pharyngeal Phase Signs & Symptoms Wet vocal quality;Audible swallow    Amount of cueing Minimal    Other treatment/comments HEP for dysphagia with rare min A.He took pills together rather than 1 at a time, but he caught him self and verbally corrected himself independently      Cognitive-Linquistic Treatment   Treatment focused on Dysarthria;Patient/family/caregiver education    Skilled Treatment Loud AH average 90dB. Structured speech task with increased cognitive load, volume averaged 67dB, with usual min A for volume. In simple conversation, Donald [name] averged 68dB with occasional min verbal cues.      Assessment / Recommendations / Plan   Plan Continue with current plan of care      Dysphagia Recommendations   Diet recommendations Thin liquid    Liquids provided via Cup    Medication Administration Whole meds  with liquid    Supervision Patient able to self feed    Compensations Slow rate;Small sips/bites;Effortful swallow;Follow solids with liquid      Progression Toward Goals   Progression toward goals Progressing toward goals                SLP Short Term Goals - 04/23/21 0930       SLP SHORT TERM GOAL #1   Title Pt will average 85dB on loud /a/ with occasional min A over 2 sessions.    Baseline 04/16/21, 04/21/21    Time 3    Period Weeks    Status Achieved      SLP SHORT TERM GOAL #2   Title Pt will average 68dB on 18/20 sentences with occasional min A    Baseline 04/16/21    Time 4    Period Weeks    Status Achieved      SLP SHORT TERM GOAL #3   Title Pt/spouse will follow 2 diet modificiations with occasional min A over 3 sessions    Baseline 04/14/21    Time 3    Period Weeks    Status On-going      SLP SHORT TERM GOAL #4   Title Pt will complete HEP for dysphagia with usual min  visual and verbal cues over 2 sessions    Time 5    Period Weeks    Status Achieved      SLP SHORT TERM GOAL #5   Title Pt will average 68dB over 5 minute conversation with occasional min A over 2 sessions    Baseline 04/14/21, 04/21/21    Time 4    Period Weeks    Status Achieved              SLP Long Term Goals - 04/23/21 0931       SLP LONG TERM GOAL #1   Title Pt will average 85dB on loud /a/ with rare min A over 2 sessions    Baseline 04/16/21    Time 10    Period Weeks    Status Achieved      SLP LONG TERM GOAL #2   Title Pt will average 68dB over 12 minute conversation with occasional min A    Time 9    Period Weeks    Status On-going      SLP LONG TERM GOAL #3   Title Pt will complete HEP for dysarthria and dysphagia with occasional min A over 3 sessions    Time 9    Period Weeks  Status On-going      SLP LONG TERM GOAL #4   Title Pt will follow swallow precautions with occasional min A over 3 sessions    Baseline 04/14/21;    Time 9    Period  Weeks    Status On-going      SLP LONG TERM GOAL #5   Title Pt/spouse will verbalize 4 s/s of aspiration pna with rare min A    Time 9    Period Weeks    Status On-going              Plan - 04/23/21 0930     Clinical Impression Statement Rare to Occasional min HEP for dysphagia and swallow precautions.Donald [name] and Donald [name] report diffculty  communicating due to Donald [name]'s low volume and running his words together. Donald [name] requires min A to maintain volume and simple conversation. Donald [name] follows swallow precautions withsupervision cues in therapy, and inconsistently at home per spouse. Donald [name] education re: s/s of aspiration pna. Continue skilled ST to maximize intelligilbity and safety fo swallow.    Speech Therapy Frequency 2x / week    Duration 12 weeks    Treatment/Interventions Language facilitation;Environmental controls;Cueing hierarchy;SLP instruction and feedback;Pharyngeal strengthening exercises;Compensatory techniques;Cognitive reorganization;Functional tasks;Compensatory strategies;Patient/family education;Multimodal communcation approach;Internal/external aids;Trials of upgraded texture/liquids;Diet toleration management by SLP;Other (comment)    Potential to Achieve Goals Fair    Potential Considerations Medical prognosis;Severity of impairments;Previous level of function             Patient will benefit from skilled therapeutic intervention in order to improve the following deficits and impairments:   Dysarthria and anarthria  Dysphagia, oropharyngeal phase    Problem List Patient Active Problem List   Diagnosis Date Noted   HOH (hard of hearing) 12/18/2020   Personal history of colonic polyps 08/31/2016   Abnormality of gait 08/27/2014   Memory difficulty 08/27/2014   Atrial fibrillation ([place]) 09/08/2013   Cholelithiases 07/19/2013   Cholelithiasis 07/19/2013   Encounter for therapeutic drug monitoring 07/03/2013   Aortic valve replaced 04/05/2013    Parkinson disease ([place]) 09/27/2012   Orthostatic hypotension 04/01/2012   Pacemaker-Medtronic 03/29/2012   HEPATITIS A 06/04/2010   HYPERTHYROIDISM 06/04/2010   HYPERLIPIDEMIA 06/04/2010   Essential hypertension 06/04/2010   CAD 06/04/2010   AORTIC STENOSIS 06/04/2010   SICK SINUS SYNDROME 06/04/2010   HIATAL HERNIA 06/04/2010   SHORTNESS OF BREATH 06/04/2010   CHEST PAIN 06/04/2010    Edit Ricciardelli, Annye Rusk, CCC-SLP 04/23/2021, 9:31 AM  [facility] [address] [place], [place], [address] Phone: [phone]   Fax:  [phone]   Name: PAXON CAVITT MRN: [id number] Date of Birth: Jan 24, 1941

## 2021-04-23 NOTE — Therapy (Signed)
[facility] [address] [place], [place], [address] Phone: [phone]   Fax:  [phone]  Physical Therapy Treatment/Re-Cert  Patient Details  Name: Donald [name] MRN: [id number] Date of Birth: 15-Feb-1941 Referring Provider (PT): Dr. Carles Collet   Encounter Date: 04/23/2021   PT End of Session - 04/23/21 1153     Visit Number 17    Number of Visits 33    Date for PT Re-Evaluation 07/22/21    Authorization Type HTA - $30 co pay    PT Start Time 1018    PT Stop Time 1102    PT Time Calculation (min) 44 min    Equipment Utilized During Treatment Gait belt    Activity Tolerance Patient tolerated treatment well    Behavior During Therapy [facility] for tasks assessed/performed;Impulsive             Past Medical History:  Diagnosis Date   Abnormality of gait 08/27/2014   Anxiety    Aortic stenosis    s/p AVR by Dr Cyndia Bent   Arthritis    left wrist- probable- not diagonised   Asthma    in the past   CAD (coronary artery disease)    s/p CABG 2006   GERD (gastroesophageal reflux disease)    H/O seasonal allergies    H/O: rheumatic fever    Hearing deficit    Bilateral hearing aids   Hematuria    had CT scan   Hiatal hernia    History of kidney stones    HOH (hard of hearing)    Hyperlipidemia    Hypertension    Hyperthyroidism    Inguinal hernia    right side; watching at this time per wife's report   Memory difficulty 08/27/2014   Pacemaker    Parkinson's disease    Persistent atrial fibrillation ([place])    Scarlet fever    as a child   Shortness of breath    Sick sinus syndrome ([place])    s/p PPM (MDT) 2006    Past Surgical History:  Procedure Laterality Date   AORTIC VALVE REPLACEMENT  2006   CARDIOVERSION N/A 09/14/2013   Procedure: CARDIOVERSION;  Surgeon: Sanda Klein, MD;  Location: [facility]COPY;  Service: Cardiovascular;  Laterality: N/A;   CHOLECYSTECTOMY N/A 07/21/2013   Procedure: LAPAROSCOPIC  CHOLECYSTECTOMY;  Surgeon: Edward Jolly, MD;  Location: [facility];  Service: General;  Laterality: N/A;   COLONOSCOPY W/ POLYPECTOMY     COLONOSCOPY WITH PROPOFOL N/A 08/31/2016   Procedure: COLONOSCOPY WITH PROPOFOL;  Surgeon: Wilford Corner, MD;  Location: [facility] ENDOSCOPY;  Service: Endoscopy;  Laterality: N/A;   CORONARY ARTERY BYPASS GRAFT  2006   DENTAL SURGERY     implants   INSERT / REPLACE / REMOVE PACEMAKER  2006, 2012   MDT for sick sinus syndrome, gen change 8/12 by [place]    There were no vitals filed for this visit.   Subjective Assessment - 04/23/21 1021     Subjective Had a fall the other day - was going to get something out of the freezer and fell backwards. Did not hurt himself.    Pertinent History PMH: PD, CAD (s/p CABG 2006), LD, HTN, a fib, pacemaker, GERD, on anticoagulants    Limitations Walking;House hold activities;Standing    Patient Stated Goals wants to improve his balance    Currently in Pain? No/denies  [facility] PT Assessment - 04/23/21 1026       Assessment   Medical Diagnosis Parkinson's Disease    Referring Provider (PT) Dr. Carles Collet    Onset Date/Surgical Date 02/14/21      Prior Function   Level of Independence Independent with basic ADLs;Independent with household mobility with device                           [facility] Adult PT Treatment/Exercise - 04/23/21 1026       Transfers   Transfers Sit to Stand;Stand to Sit    Sit to Stand 5: Supervision    Five time sit to stand comments  14.82 seconds, no UE support, only one slight episode of retropulsion    Stand to Sit 5: Supervision    Comments 2 x10 reps from mat table, with cues for technique - scooting out towards edge wider BOS, and incr forward lean and standing with tall posture. Cues to use BUE support from mat vs. crossing arms over chest and pt able to get weight more forward with pushing from mat to help decr retropulsion. Cues  for slowed pace and tall posture once in standing.      Ambulation/Gait   Ambulation/Gait Yes    Ambulation/Gait Assistance 4: Min guard    Ambulation/Gait Assistance Details With RW - trialed using a laser today at the front of pt's walker with shining laser more forward on the ground as visual cue of what to step to. Pt with significantly more festination episodes today with pt needing cues to stop all the way and reset frequently with getting heels down on the ground and standing tall before taking a big step. A couple times with stopping to reset, cues to take a couple deep breaths. Cues for bigger steps throughout, esp with RLE. Discussed possible option of using laser with RW at home for gait and showed pt's wife on where to purchase on [place] Will continue to further practice with pt in session.    Ambulation Distance (Feet) 345 Feet   x1, 100 x 1   Assistive device Rolling walker    Gait Pattern Step-through pattern;Decreased step length - right;Decreased step length - left    Ambulation Surface Level;Indoor    Pre-Gait Activities Standing at [place]: 2 x 10 reps marching with demo cues for how high to lift up legs      Exercises   Exercises Other Exercises    Other Exercises  After gait, pt reporting incr tightness in R hamstring. Pt's spouse reports that he had issues with this a couple days ago. Performed seated hamstring stretch 2 x 30 seconds bilat, with additional calf stretch from therapist with pt reporting relief. Cues for posture throughout. Discussed with pt's wife performing with pt at home with propping his foot on a block                       PT Short Term Goals - 04/23/21 1155       PT SHORT TERM GOAL #1   Title ALL STGS = LTGS               PT Long Term Goals - 04/23/21 1159       PT LONG TERM GOAL #1   Title Pt will perform final HEP with supervision from pt's spouse for strength, balance, and transfers in order to build upon functional gains made  in therapy.  ALL LTGS DUE 04/21/21    Baseline will continue to benefit from reviews/updates to HEP going forwards.    Time 12    Period Weeks    Status On-going    Target Date 04/21/21      PT LONG TERM GOAL #2   Title Pt will ambulate >500' with RW supervision on varied level surfaces for improved household and short community distances.    Baseline 345' with RW with min uard, incr festination episodes    Time 12    Period Weeks    Status Not Met    Target Date 04/21/21      PT LONG TERM GOAL #3   Title Pt will be able to perform 5 x sit to stand in <12 sec without hands but controlling descent for improved balance and functional mobility.    Baseline 14.82 seconds with no UE support, more controlled descent today.    Time 12    Period Weeks    Status Partially Met    Target Date 04/21/21      PT LONG TERM GOAL #4   Title Pt will perform TUG with RW in <16 sec for improved balance and functional mobility.    Baseline 23.75 seconds with RW, 18.54 seconds with RW 03/26/21; did not perform due to time constraints of 04/23/21    Time 12    Period Weeks    Status Deferred    Target Date 04/21/21            Updated/revised LTGs:  PT Long Term Goals - 04/23/21 1220       PT LONG TERM GOAL #1   Title Pt will perform final HEP with supervision from pt's spouse for strength, balance, and transfers in order to build upon functional gains made in therapy. ALL LTGS DUE 05/21/21    Baseline will continue to benefit from reviews/updates to HEP going forwards.    Time 4    Period Weeks    Status On-going    Target Date 05/21/21      PT LONG TERM GOAL #2   Title Pt will ambulate 81' with RW and supervision over level indoor surfaces with use of laser as visual cue and pt's spouse giving cues to pt for improved household and short community distances.    Baseline 345' with RW with min uard, incr festination episodes    Time 4    Period Weeks    Status Revised      PT LONG TERM GOAL  #3   Title Pt will be able to perform 10 sit <> stands with BUE support with proper technique and no episodes of retropulson in order to demo improved balance/functional transfers.    Baseline 5x sit <> stand: 14.82 seconds with no UE support, more controlled descent today.    Time 4    Period Weeks    Status Revised      PT LONG TERM GOAL #4   Title Pt and pt's spouse will verbalize understanding/cues for strategies to assist with freezing/festination episodes at home.    Time 4    Period Weeks    Status New                    Plan - 04/23/21 1204     Clinical Impression Statement Began to assess LTGs today. Pt partially met LTG #3, performed 5x sit<> stand in 14.82 seconds with no UE support, with improved eccentric control today. Continued to  practice proper technique with sit <> stands for incr forward weight shift and wider BOS. After success with last session with trying visual cue farther ahead of pt to incr stride length, trialed with a horizontal red laser attached to pt's RW for a visual cue farther ahead. Pt did not respond as well today with significant festination episodes today with gait and needs consistent cues to STOP and reset, get feet flat on the floor, stand tall before taking a big step to continue gait. Cues for bigger effort with steps and to really kick feet out. Pt with decr stride length today esp with RLE. Will continue to need further practice/repetition. Did not meet LTG #2 due to severity of deficits. Pt will continue to benefit from skilled PT to address gait, transfers, posture balance, ways to decr fall risk and strategies for festination/freezing episodes, and education with pt's wife for incr safety at home. LTGs updated/revised as appropriate.    Personal Factors and Comorbidities Comorbidity 3+;Time since onset of injury/illness/exacerbation;Past/Current Experience;Behavior Pattern    Comorbidities PD (diagnosed 2012), CAD (s/p CABG 2006), LD, HTN, a  fib, pacemaker, GERD, on anticoagulants    Examination-Activity Limitations Bed Mobility;Caring for Others;Toileting;Transfers;Stairs;Locomotion Level;Stand;Squat;Reach Overhead    Designer, fashion/clothing;Yard Work    Merchant [employer] officer Evolving/Moderate complexity    Rehab Potential Good    PT Frequency 2x / week    PT Duration 8 weeks    PT Treatment/Interventions ADLs/Self Care Home Management;DME Instruction;Therapeutic activities;Functional mobility training;Gait training;Stair training;Therapeutic exercise;Balance training;Neuromuscular re-education;Patient/family education;Energy conservation;Vestibular    PT Next Visit Plan review stepping strategies to HEP.  Pt is very hard of hearing even with hearing aids (need to wear clear mask). gait trianing with RW using red tband as big kicks/steps to the red. try again attaching head lamp/laser for incr stride length.  standing foot clearance and weight shifting. Review techniques/cues for wife to perform with gait, transfers and freezing episodes.    PT Home Exercise Plan [id number]    Consulted and Agree with Plan of Care Patient;Family member/caregiver    Family Member Consulted pt's [name]             Patient will benefit from skilled therapeutic intervention in order to improve the following deficits and impairments:  Abnormal gait, Decreased activity tolerance, Decreased coordination, Decreased balance, Decreased endurance, Decreased knowledge of use of DME, Decreased safety awareness, Decreased mobility, Decreased strength, Difficulty walking, Impaired flexibility, Postural dysfunction  Visit Diagnosis: Muscle weakness (generalized)  Unsteadiness on feet  Other symptoms and signs involving the nervous system  Abnormal posture  Other abnormalities of gait and mobility     Problem List Patient Active Problem List   Diagnosis Date Noted   HOH  (hard of hearing) 12/18/2020   Personal history of colonic polyps 08/31/2016   Abnormality of gait 08/27/2014   Memory difficulty 08/27/2014   Atrial fibrillation ([place]) 09/08/2013   Cholelithiases 07/19/2013   Cholelithiasis 07/19/2013   Encounter for therapeutic drug monitoring 07/03/2013   Aortic valve replaced 04/05/2013   Parkinson disease ([place]) 09/27/2012   Orthostatic hypotension 04/01/2012   Pacemaker-Medtronic 03/29/2012   HEPATITIS A 06/04/2010   HYPERTHYROIDISM 06/04/2010   HYPERLIPIDEMIA 06/04/2010   Essential hypertension 06/04/2010   CAD 06/04/2010   AORTIC STENOSIS 06/04/2010   SICK SINUS SYNDROME 06/04/2010   HIATAL HERNIA 06/04/2010   SHORTNESS OF BREATH 06/04/2010   CHEST PAIN 06/04/2010    Arliss Journey, PT, DPT  04/23/2021, 12:19 PM  [facility]  [facility] [address] [place], [place], [address] Phone: [phone]   Fax:  [phone]  Name: KORDEL LEAVY MRN: [id number] Date of Birth: [birth year]

## 2021-04-24 ENCOUNTER — Ambulatory Visit (INDEPENDENT_AMBULATORY_CARE_PROVIDER_SITE_OTHER): Payer: PPO | Admitting: *Deleted

## 2021-04-24 DIAGNOSIS — Z5181 Encounter for therapeutic drug level monitoring: Secondary | ICD-10-CM | POA: Diagnosis not present

## 2021-04-24 LAB — POCT INR: INR: 2 (ref 2.0–3.0)

## 2021-04-24 NOTE — Therapy (Signed)
[facility] [address] [place], [place], [address] Phone: [phone]   Fax:  [phone]  Occupational Therapy Treatment  Patient Details  Name: Donald [name] MRN: [id number] Date of Birth: July 13, 1940 Referring Provider (OT): Dr. Wells Guiles Tat   Encounter Date: 04/23/2021   OT End of Session - 04/24/21 1004     Visit Number 12    Number of Visits 17    Date for OT Re-Evaluation 04/26/21    Authorization Type [employer], follow Medicare guidelines, copay per day    Authorization - Visit Number 12    Authorization - Number of Visits 20    Progress Note Due on Visit 20    OT Start Time 0848    OT Stop Time 0930    OT Time Calculation (min) 42 min    Activity Tolerance Patient tolerated treatment well    Behavior During Therapy [facility] for tasks assessed/performed;Impulsive             Past Medical History:  Diagnosis Date   Abnormality of gait 08/27/2014   Anxiety    Aortic stenosis    s/p AVR by Dr Cyndia Bent   Arthritis    left wrist- probable- not diagonised   Asthma    in the past   CAD (coronary artery disease)    s/p CABG 2006   GERD (gastroesophageal reflux disease)    H/O seasonal allergies    H/O: rheumatic fever    Hearing deficit    Bilateral hearing aids   Hematuria    had CT scan   Hiatal hernia    History of kidney stones    HOH (hard of hearing)    Hyperlipidemia    Hypertension    Hyperthyroidism    Inguinal hernia    right side; watching at this time per wife's report   Memory difficulty 08/27/2014   Pacemaker    Parkinson's disease    Persistent atrial fibrillation ([place])    Scarlet fever    as a child   Shortness of breath    Sick sinus syndrome ([place])    s/p PPM (MDT) 2006    Past Surgical History:  Procedure Laterality Date   AORTIC VALVE REPLACEMENT  2006   CARDIOVERSION N/A 09/14/2013   Procedure: CARDIOVERSION;  Surgeon: Sanda Klein, MD;  Location: [facility]  ENDOSCOPY;  Service: Cardiovascular;  Laterality: N/A;   CHOLECYSTECTOMY N/A 07/21/2013   Procedure: LAPAROSCOPIC CHOLECYSTECTOMY;  Surgeon: Edward Jolly, MD;  Location: [facility];  Service: General;  Laterality: N/A;   COLONOSCOPY W/ POLYPECTOMY     COLONOSCOPY WITH PROPOFOL N/A 08/31/2016   Procedure: COLONOSCOPY WITH PROPOFOL;  Surgeon: Wilford Corner, MD;  Location: [facility] ENDOSCOPY;  Service: Endoscopy;  Laterality: N/A;   CORONARY ARTERY BYPASS GRAFT  2006   DENTAL SURGERY     implants   INSERT / REPLACE / REMOVE PACEMAKER  2006, 2012   MDT for sick sinus syndrome, gen change 8/12 by [place]    There were no vitals filed for this visit.     Pain: pt denies pain  Pt practiced ambulating with walker using walker tray, min a and min-mod v.c for step length, pt with significant festination today. Functional reaching to retrieve items from cabinet with left and right  UE's to place on walker tray, min v.c for amplitude and foot placement Therapist checked progress towards goals for box and blocks and 9 hole peg test Placing  pegs into peg board with LUE with mod facilitation to prevent shoulder abduction and pronation.                    OT Education - 04/24/21 1001     Education Details started education regarding preventing falls, discussed staggered foot placement, use of walker try to carry items instead of holding in hand along with walker, therapist recommended that pt's wife is with him when reaching into frige or freezer as these are the times in which he has had falls    Person(s) Educated Patient;Spouse    Methods Explanation;Demonstration;Handout;Verbal cues    Comprehension Verbalized understanding;Returned demonstration;Verbal cues required;Tactile cues required              OT Short Term Goals - 04/23/21 1002       OT SHORT TERM GOAL #1   Title Pt will perform PD-specific HEP with cueing prn from caregiver--check STGs  03/27/21    Time 4    Period Weeks    Status Achieved      OT SHORT TERM GOAL #2   Title Pt will improve R hand coordination for ADLs (buttoning/tying shoes) as shown by completing 9-hole peg test in 75sec or less.    Baseline 88.31sec    Time 4    Period Weeks    Status On-going   04/23/21 1 min 42 secs     OT SHORT TERM GOAL #3   Title Pt will improve L hand coordination for ADLs (buttoning/tying shoes) as shown by completing 9-hole peg test in 90 sec or less.    Baseline 108.94sec    Time 4    Period Weeks    Status On-going   04/23/21:> 2 mins     OT SHORT TERM GOAL #4   Title Pt/caregiver will verbalize understanding of ways to decr risk of future complcations related to PD (including falls).    Time 4    Period Weeks    Status On-going               OT Long Term Goals - 04/23/21 0950       OT LONG TERM GOAL #1   Title Pt/caregiver will verbalize understanding of AE/strategies to incr ease, safety, and independence with ADLs/IADLs.--check LTGs 04/26/21    Status On-going      OT LONG TERM GOAL #2   Title Pt will improve R hand coordination for ADLs (buttoning/tying shoes) as shown by completing 9-hole peg test in 65sec or less.    Status On-going      OT LONG TERM GOAL #3   Title Pt will improve L hand coordination for ADLs (buttoning/tying shoes) as shown by completing 9-hole peg test in 75 sec or less.    Status On-going   04/23/21 >2 mins     OT LONG TERM GOAL #4   Title Pt will improve functional reaching/coordination for ADLs as shown by improving score on box and blocks test by at least 5 with dominant LUE.    Status On-going   04/23/21-35 blocks, 3 block increase     OT LONG TERM GOAL #5   Title Pt/caregiver will verbalize understanding of appropriate community resources.    Status On-going                   Plan - 04/24/21 1000     Clinical Impression Statement Pt is progressing slowly towards goals due to severity of deficits.    OT  Occupational Profile and History Detailed Assessment- Review of Records and additional review of physical, cognitive, psychosocial history related to current functional performance    Occupational performance deficits (Please refer to evaluation for details): ADL's;IADL's;Leisure    Body Structure / Function / Physical Skills Balance;ADL;Decreased knowledge of use of DME;UE functional use;IADL;Improper spinal/pelvic alignment;Vision;Mobility;Coordination;Decreased knowledge of precautions;FMC    Rehab Potential Good    Clinical Decision Making Several treatment options, min-mod task modification necessary    Comorbidities Affecting Occupational Performance: May have comorbidities impacting occupational performance    Modification or Assistance to Complete Evaluation  Min-Moderate modification of tasks or assist with assess necessary to complete eval    OT Frequency 2x / week    OT Duration 8 weeks   +eval   OT Treatment/Interventions Self-care/ADL training;Moist Heat;DME and/or AE instruction;Therapeutic activities;Therapeutic exercise;Cognitive remediation/compensation;Visual/perceptual remediation/compensation;Passive range of motion;Functional Mobility Training;Neuromuscular education;Cryotherapy;Energy conservation;Manual Therapy;Patient/family education    Plan check remaining goals and renew next visit, educate on ways to decr falls/future complications;    Consulted and Agree with Plan of Care Patient;Family member/caregiver    Family Member Consulted wife             Patient will benefit from skilled therapeutic intervention in order to improve the following deficits and impairments:   Body Structure / Function / Physical Skills: Balance, ADL, Decreased knowledge of use of DME, UE functional use, IADL, Improper spinal/pelvic alignment, Vision, Mobility, Coordination, Decreased knowledge of precautions, FMC       Visit Diagnosis: Other abnormalities of gait and mobility  Other  lack of coordination  Muscle weakness (generalized)  Unsteadiness on feet  Other symptoms and signs involving the nervous system  Visuospatial deficit    Problem List Patient Active Problem List   Diagnosis Date Noted   HOH (hard of hearing) 12/18/2020   Personal history of colonic polyps 08/31/2016   Abnormality of gait 08/27/2014   Memory difficulty 08/27/2014   Atrial fibrillation (HCC) 09/08/2013   Cholelithiases 07/19/2013   Cholelithiasis 07/19/2013   Encounter for therapeutic drug monitoring 07/03/2013   Aortic valve replaced 04/05/2013   Parkinson disease (HCC) 09/27/2012   Orthostatic hypotension 04/01/2012   Pacemaker-Medtronic 03/29/2012   HEPATITIS A 06/04/2010   HYPERTHYROIDISM 06/04/2010   HYPERLIPIDEMIA 06/04/2010   Essential hypertension 06/04/2010   CAD 06/04/2010   AORTIC STENOSIS 06/04/2010   SICK SINUS SYNDROME 06/04/2010   HIATAL HERNIA 06/04/2010   SHORTNESS OF BREATH 06/04/2010   CHEST PAIN 06/04/2010    Tahj Njoku, OT 04/24/2021, 10:11 AM  [facility] [address] [place], [place], [address] Phone: [phone]   Fax:  [phone]  Name: Donald [name] MRN: [id number] Date of Birth: 07-16-1940

## 2021-04-24 NOTE — Patient Instructions (Signed)
Description   Today take 1 tablet then continue taking 1 tablet daily except 1/2 tablet on Sundays, Tuesdays and Thursdays. Recheck INR in 3 weeks. Call [phone] call with any new medications or if scheduled for any other procedures. Clearance/Procedure Fax #[phone]

## 2021-04-28 ENCOUNTER — Encounter: Payer: Self-pay | Admitting: Physical Therapy

## 2021-04-28 ENCOUNTER — Ambulatory Visit: Payer: PPO | Admitting: Occupational Therapy

## 2021-04-28 ENCOUNTER — Ambulatory Visit: Payer: PPO | Admitting: Physical Therapy

## 2021-04-28 ENCOUNTER — Other Ambulatory Visit: Payer: Self-pay

## 2021-04-28 ENCOUNTER — Ambulatory Visit: Payer: PPO | Admitting: Speech Pathology

## 2021-04-28 ENCOUNTER — Encounter: Payer: Self-pay | Admitting: Occupational Therapy

## 2021-04-28 ENCOUNTER — Encounter: Payer: Self-pay | Admitting: Speech Pathology

## 2021-04-28 DIAGNOSIS — R29818 Other symptoms and signs involving the nervous system: Secondary | ICD-10-CM

## 2021-04-28 DIAGNOSIS — R471 Dysarthria and anarthria: Secondary | ICD-10-CM

## 2021-04-28 DIAGNOSIS — R2681 Unsteadiness on feet: Secondary | ICD-10-CM

## 2021-04-28 DIAGNOSIS — R293 Abnormal posture: Secondary | ICD-10-CM

## 2021-04-28 DIAGNOSIS — R278 Other lack of coordination: Secondary | ICD-10-CM

## 2021-04-28 DIAGNOSIS — R1312 Dysphagia, oropharyngeal phase: Secondary | ICD-10-CM

## 2021-04-28 DIAGNOSIS — R41842 Visuospatial deficit: Secondary | ICD-10-CM

## 2021-04-28 DIAGNOSIS — R251 Tremor, unspecified: Secondary | ICD-10-CM

## 2021-04-28 DIAGNOSIS — R2689 Other abnormalities of gait and mobility: Secondary | ICD-10-CM

## 2021-04-28 NOTE — Therapy (Signed)
[facility] [address] [place], [place], [address] Phone: [phone]   Fax:  [phone]  Occupational Therapy Treatment  Patient Details  Name: Donald [name] MRN: [id number] Date of Birth: 22-Nov-1940 Referring Provider (OT): Dr. Wells Guiles Tat   Encounter Date: 04/28/2021   OT End of Session - 04/28/21 0849     Visit Number 13    Number of Visits 21   13+8=21   Date for OT Re-Evaluation 05/28/21    Authorization Type [employer], follow Medicare guidelines, copay per day    Authorization - Visit Number 13    Authorization - Number of Visits 20    Progress Note Due on Visit 20    OT Start Time 0849    OT Stop Time 0930    OT Time Calculation (min) 41 min    Activity Tolerance Patient tolerated treatment well    Behavior During Therapy [facility] for tasks assessed/performed;Impulsive             Past Medical History:  Diagnosis Date   Abnormality of gait 08/27/2014   Anxiety    Aortic stenosis    s/p AVR by Dr Cyndia Bent   Arthritis    left wrist- probable- not diagonised   Asthma    in the past   CAD (coronary artery disease)    s/p CABG 2006   GERD (gastroesophageal reflux disease)    H/O seasonal allergies    H/O: rheumatic fever    Hearing deficit    Bilateral hearing aids   Hematuria    had CT scan   Hiatal hernia    History of kidney stones    HOH (hard of hearing)    Hyperlipidemia    Hypertension    Hyperthyroidism    Inguinal hernia    right side; watching at this time per wife's report   Memory difficulty 08/27/2014   Pacemaker    Parkinson's disease    Persistent atrial fibrillation ([place])    Scarlet fever    as a child   Shortness of breath    Sick sinus syndrome ([place])    s/p PPM (MDT) 2006    Past Surgical History:  Procedure Laterality Date   AORTIC VALVE REPLACEMENT  2006   CARDIOVERSION N/A 09/14/2013   Procedure: CARDIOVERSION;  Surgeon: Sanda Klein, MD;  Location:  [facility] ENDOSCOPY;  Service: Cardiovascular;  Laterality: N/A;   CHOLECYSTECTOMY N/A 07/21/2013   Procedure: LAPAROSCOPIC CHOLECYSTECTOMY;  Surgeon: Edward Jolly, MD;  Location: [facility];  Service: General;  Laterality: N/A;   COLONOSCOPY W/ POLYPECTOMY     COLONOSCOPY WITH PROPOFOL N/A 08/31/2016   Procedure: COLONOSCOPY WITH PROPOFOL;  Surgeon: Wilford Corner, MD;  Location: [facility] ENDOSCOPY;  Service: Endoscopy;  Laterality: N/A;   CORONARY ARTERY BYPASS GRAFT  2006   DENTAL SURGERY     implants   INSERT / REPLACE / REMOVE PACEMAKER  2006, 2012   MDT for sick sinus syndrome, gen change 8/12 by [place]    There were no vitals filed for this visit.   Subjective Assessment - 04/28/21 0851     Subjective  "sat down"/[place] yesterday stepping [place] curb, no injuries    Pertinent History Parkinson's Disease.   PMH:  hx of falls (wears helment), arthritis, asthma, CAD, GERD, HOH, HLD, HTN, hyperthyroidism, anxiety, aortic stenosis, hx of aortic valve replacement 2006, memory difficulty, pacemaker, persistent a-fib, hx of scarlet fever, shortness  of breath, hx of CABG 2006, cholecystectomy 2015, neuropathy    Limitations fall risk (wears helment and pads), freezing, hard of hearing--reads lips (**wear clear mask)    Patient Stated Goals improve coordination    Currently in Pain? No/denies             Picking up and placing medium pegs in pegboard with each hand with min-mod cueing for in-hand manipulation/avoiding compensation.  Pt with incr difficulty with L hand (due to neuropathy), but improved with repetition and cueing for hand position.         OT Education - 04/28/21 1446     Education Details Educated pt/wife in appropriate community resources and ways to decr risk of future complications including falls (also reviewed fall prevention/freezing strategies); Discussed/educated pt/wife in ways to cue for use of strategies (including wife/pt  to discuss ways in which pt prefers to be cued--verbal, tactile, anticipatory, etc)    Person(s) Educated Patient;Spouse    Methods Explanation;Handout    Comprehension Verbalized understanding              OT Short Term Goals - 04/28/21 1449       OT SHORT TERM GOAL #1   Title Pt will perform PD-specific HEP with cueing prn from caregiver--check STGs 03/27/21    Time 4    Period Weeks    Status Achieved      OT SHORT TERM GOAL #2   Title Pt will improve R hand coordination for ADLs (buttoning/tying shoes) as shown by completing 9-hole peg test in 75sec or less.    Baseline 88.31sec    Time 4    Period Weeks    Status On-going   04/23/21 1 min 42 secs     OT SHORT TERM GOAL #3   Title Pt will improve L hand coordination for ADLs (buttoning/tying shoes) as shown by completing 9-hole peg test in 90 sec or less.    Baseline 108.94sec    Time 4    Period Weeks    Status On-going   04/23/21:> 2 mins     OT SHORT TERM GOAL #4   Title Pt/caregiver will verbalize understanding of ways to decr risk of future complcations related to PD (including falls).    Time 4    Period Weeks    Status Achieved               OT Long Term Goals - 04/28/21 1449       OT LONG TERM GOAL #1   Title Pt/caregiver will verbalize understanding of AE/strategies to incr ease, safety, and independence with ADLs/IADLs.--check LTGs 04/26/21    Status On-going      OT LONG TERM GOAL #2   Title Pt will improve R hand coordination for ADLs (buttoning/tying shoes) as shown by completing 9-hole peg test in 65sec or less.    Status On-going      OT LONG TERM GOAL #3   Title Pt will improve L hand coordination for ADLs (buttoning/tying shoes) as shown by completing 9-hole peg test in 75 sec or less.    Status On-going   04/23/21 >2 mins     OT LONG TERM GOAL #4   Title Pt will improve functional reaching/coordination for ADLs as shown by improving score on box and blocks test by at least 5 with  dominant LUE.    Status On-going   04/23/21-35 blocks, 3 block increase     OT LONG TERM GOAL #5  Title Pt/caregiver will verbalize understanding of appropriate community resources.    Status Achieved                   Plan - 04/28/21 0849     Clinical Impression Statement Pt is progressing slowly towards goals due to severity of deficits.  Pt demo improving in-hand coordination with incr time, but neuropathy affects hand use.  Pt continues to demo timing deficits and is a fall risk.  Pt would benefit from further occupational therapy to continue to address deficits for incr carryover (as pt benefits from repetition/cueing) and to maximize safety/independence.    OT Occupational Profile and History Detailed Assessment- Review of Records and additional review of physical, cognitive, psychosocial history related to current functional performance    Occupational performance deficits (Please refer to evaluation for details): ADL's;IADL's;Leisure    Body Structure / Function / Physical Skills Balance;ADL;Decreased knowledge of use of DME;UE functional use;IADL;Improper spinal/pelvic alignment;Vision;Mobility;Coordination;Decreased knowledge of precautions;FMC    Rehab Potential Good    Clinical Decision Making Several treatment options, min-mod task modification necessary    Comorbidities Affecting Occupational Performance: May have comorbidities impacting occupational performance    Modification or Assistance to Complete Evaluation  Min-Moderate modification of tasks or assist with assess necessary to complete eval    OT Frequency 2x / week    OT Duration 4 weeks    OT Treatment/Interventions Self-care/ADL training;Moist Heat;DME and/or AE instruction;Therapeutic activities;Therapeutic exercise;Cognitive remediation/compensation;Visual/perceptual remediation/compensation;Passive range of motion;Functional Mobility Training;Neuromuscular education;Cryotherapy;Energy conservation;Manual  Therapy;Patient/family education    Plan continue to address coordination, strategies for functional reaching in standing (continue with unmet STGs/LTGs); begin to discuss follow-up after d/c    Consulted and Agree with Plan of Care Patient;Family member/caregiver    Family Member Consulted wife             Patient will benefit from skilled therapeutic intervention in order to improve the following deficits and impairments:   Body Structure / Function / Physical Skills: Balance, ADL, Decreased knowledge of use of DME, UE functional use, IADL, Improper spinal/pelvic alignment, Vision, Mobility, Coordination, Decreased knowledge of precautions, FMC       Visit Diagnosis: Other symptoms and signs involving the nervous system  Other lack of coordination  Abnormal posture  Unsteadiness on feet  Other abnormalities of gait and mobility  Visuospatial deficit  Tremor    Problem List Patient Active Problem List   Diagnosis Date Noted   HOH (hard of hearing) 12/18/2020   Personal history of colonic polyps 08/31/2016   Abnormality of gait 08/27/2014   Memory difficulty 08/27/2014   Atrial fibrillation (HCC) 09/08/2013   Cholelithiases 07/19/2013   Cholelithiasis 07/19/2013   Encounter for therapeutic drug monitoring 07/03/2013   Aortic valve replaced 04/05/2013   Parkinson disease (HCC) 09/27/2012   Orthostatic hypotension 04/01/2012   Pacemaker-Medtronic 03/29/2012   HEPATITIS A 06/04/2010   HYPERTHYROIDISM 06/04/2010   HYPERLIPIDEMIA 06/04/2010   Essential hypertension 06/04/2010   CAD 06/04/2010   AORTIC STENOSIS 06/04/2010   SICK SINUS SYNDROME 06/04/2010   HIATAL HERNIA 06/04/2010   SHORTNESS OF BREATH 06/04/2010   CHEST PAIN 06/04/2010    Willa Frater, OT 04/28/2021, 2:54 PM  [facility] [address] [place], [place], [address] Phone: [phone]   Fax:  [phone]  Name: Donald [name] MRN: [id number] Date of Birth: 1940/10/04  Willa Frater, OTR/L [facility] [address] [place], [place]  [address] [phone] phone [phone] 04/28/21 2:54  PM

## 2021-04-28 NOTE — Therapy (Addendum)
[facility] [address] [place], [place], [address] Phone: [phone]   Fax:  [phone]  Physical Therapy Treatment  Patient Details  Name: Donald [name] MRN: [id number] Date of Birth: [birth year] Referring Provider (PT): Dr. Carles Collet   Encounter Date: 04/28/2021   PT End of Session - 04/28/21 0939     Visit Number 18    Number of Visits 33    Date for PT Re-Evaluation 07/22/21    Authorization Type HTA - $30 co pay    PT Start Time 0934    PT Stop Time 1016    PT Time Calculation (min) 42 min    Equipment Utilized During Treatment Gait belt    Activity Tolerance Patient tolerated treatment well    Behavior During Therapy [facility] for tasks assessed/performed;Impulsive             Past Medical History:  Diagnosis Date   Abnormality of gait 08/27/2014   Anxiety    Aortic stenosis    s/p AVR by Dr Cyndia Bent   Arthritis    left wrist- probable- not diagonised   Asthma    in the past   CAD (coronary artery disease)    s/p CABG 2006   GERD (gastroesophageal reflux disease)    H/O seasonal allergies    H/O: rheumatic fever    Hearing deficit    Bilateral hearing aids   Hematuria    had CT scan   Hiatal hernia    History of kidney stones    HOH (hard of hearing)    Hyperlipidemia    Hypertension    Hyperthyroidism    Inguinal hernia    right side; watching at this time per wife's report   Memory difficulty 08/27/2014   Pacemaker    Parkinson's disease    Persistent atrial fibrillation ([place])    Scarlet fever    as a child   Shortness of breath    Sick sinus syndrome ([place])    s/p PPM (MDT) 2006    Past Surgical History:  Procedure Laterality Date   AORTIC VALVE REPLACEMENT  2006   CARDIOVERSION N/A 09/14/2013   Procedure: CARDIOVERSION;  Surgeon: Sanda Klein, MD;  Location: [facility];  Service: Cardiovascular;  Laterality: N/A;   CHOLECYSTECTOMY N/A 07/21/2013   Procedure: LAPAROSCOPIC  CHOLECYSTECTOMY;  Surgeon: Edward Jolly, MD;  Location: [facility];  Service: General;  Laterality: N/A;   COLONOSCOPY W/ POLYPECTOMY     COLONOSCOPY WITH PROPOFOL N/A 08/31/2016   Procedure: COLONOSCOPY WITH PROPOFOL;  Surgeon: Wilford Corner, MD;  Location: [facility];  Service: Endoscopy;  Laterality: N/A;   CORONARY ARTERY BYPASS GRAFT  2006   DENTAL SURGERY     implants   INSERT / REPLACE / REMOVE PACEMAKER  2006, 2012   MDT for sick sinus syndrome, gen change 8/12 by Belle Rive    There were no vitals filed for this visit.   Subjective Assessment - 04/28/21 0938     Subjective Had a fall the other day. Was leaving church when going down the curb. Fell backwards on his bottom.    Pertinent History PMH: PD, CAD (s/p CABG 2006), LD, HTN, a fib, pacemaker, GERD, on anticoagulants    Limitations Walking;House hold activities;Standing    Patient Stated Goals wants to improve his balance    Currently in Pain? No/denies  [facility] Adult PT Treatment/Exercise - 04/28/21 1652       Transfers   Transfers Sit to Stand;Stand to Sit    Sit to Stand 5: Supervision    Stand to Sit 5: Supervision    Comments Intermittent cues for widenend BOS and for incr forward lean to stand, performed approx 5 reps throughout session.      Ambulation/Gait   Ambulation/Gait Yes    Ambulation/Gait Assistance 4: Min guard    Ambulation/Gait Assistance Details Continued to use laser on RW for a visual cue (more in front of pt) vs. use of red tband on bottom of walker. Pt with much better gait pattern today with no festination episodes, did just need cues throughout for incr step length to the laser and for incr foot clearance. Also ambulated outdoors over unlevel paved surfaces with continued cues for stride length. It was difficult to see the laser over paved and concrete surfaces. Reviewed with pt's spouse cues to give for pt at home  during festination/freezing episodes to stop and reset. And for incr step length towards use of visual cue at RW.    Ambulation Distance (Feet) 230 Feet   x1, 300' x 1   Assistive device Rolling walker    Gait Pattern Step-through pattern;Decreased step length - right;Decreased step length - left    Ambulation Surface Level;Indoor;Unlevel;Outdoor;Paved    Curb Other (comment)   MIN GUARD   Gait Comments Performed a curb outside x2 reps with RW, with pt needing cues to get closer to RW before steppin down and up onto curb. Pt's wife always with pt when performing curbs out in the community.                 Balance Exercises - 04/28/21 1651       Balance Exercises: Standing   Stepping Strategy Limitations;Posterior    Stepping Strategy Limitations With BUE support in // bars, performed x15 reps each leg, cues for full weight posteriorly and getting heel down and well as for slowed pace    Other Standing Exercises At start of session: x10 reps lateral weight shifting with slowed pace, x10 reps slow marching to boomwhacker, x12 reps alternating forward stepping and weight shifting. All performed at [facility]                PT Education - 04/28/21 1148     Education Details Purchasing a laser to put on pt's RW for visual cue to step to vs. using tband on RW,gave pt's spouse handout on where to purchase at last session    Person(s) Educated Patient;Spouse    Methods Explanation    Comprehension Verbalized understanding              PT Short Term Goals - 04/23/21 1155       PT SHORT TERM GOAL #1   Title ALL STGS = LTGS               PT Long Term Goals - 04/23/21 1220       PT LONG TERM GOAL #1   Title Pt will perform final HEP with supervision from pt's spouse for strength, balance, and transfers in order to build upon functional gains made in therapy. ALL LTGS DUE 05/21/21    Baseline will continue to benefit from reviews/updates to HEP going forwards.    Time 4     Period Weeks    Status On-going    Target Date 05/21/21  PT LONG TERM GOAL #2   Title Pt will ambulate 13' with RW and supervision over level indoor surfaces with use of laser as visual cue and pt's spouse giving cues to pt for improved household and short community distances.    Baseline 345' with RW with min uard, incr festination episodes    Time 4    Period Weeks    Status Revised      PT LONG TERM GOAL #3   Title Pt will be able to perform 10 sit <> stands with BUE support with proper technique and no episodes of retropulson in order to demo improved balance/functional transfers.    Baseline 5x sit <> stand: 14.82 seconds with no UE support, more controlled descent today.    Time 4    Period Weeks    Status Revised      PT LONG TERM GOAL #4   Title Pt and pt's spouse will verbalize understanding/cues for strategies to assist with freezing/festination episodes at home.    Time 4    Period Weeks    Status New                   Plan - 04/29/21 [id number]     Clinical Impression Statement Pt with significant improvement in his gait today with RW and use of laser (attached to his RW) as visual cue. Pt with no episodes of festination throughout session today. Did need cues throughout for incr stride length and foot clearance to the laser. Pt did prefer using the laser as a visual cue vs. using the red tband as he feels like he doesn't have to worry about getting his foot caught in it. Pt's spouse to look at purchasing one to attach to his RW. Will continue to progress towards LTGs.    Personal Factors and Comorbidities Comorbidity 3+;Time since onset of injury/illness/exacerbation;Past/Current Experience;Behavior Pattern    Comorbidities PD (diagnosed 2012), CAD (s/p CABG 2006), LD, HTN, a fib, pacemaker, GERD, on anticoagulants    Examination-Activity Limitations Bed Mobility;Caring for Others;Toileting;Transfers;Stairs;Locomotion Level;Stand;Squat;Reach Overhead     Designer, fashion/clothing;Yard Work    Merchant navy officer Evolving/Moderate complexity    Rehab Potential Good    PT Frequency 2x / week    PT Duration 8 weeks    PT Treatment/Interventions ADLs/Self Care Home Management;DME Instruction;Therapeutic activities;Functional mobility training;Gait training;Stair training;Therapeutic exercise;Balance training;Neuromuscular re-education;Patient/family education;Energy conservation;Vestibular    PT Next Visit Plan review stepping strategies to HEP.  Pt is very hard of hearing even with hearing aids (need to wear clear mask). gait trianing with RW using red tband as big kicks/steps to the red. try again attaching head lamp/laser for incr stride length.  standing foot clearance and weight shifting. Review techniques/cues for wife to perform with gait, transfers and freezing episodes.    PT Home Exercise Plan [id number]    Consulted and Agree with Plan of Care Patient;Family member/caregiver    Family Member Consulted pt's [name]             Patient will benefit from skilled therapeutic intervention in order to improve the following deficits and impairments:  Abnormal gait, Decreased activity tolerance, Decreased coordination, Decreased balance, Decreased endurance, Decreased knowledge of use of DME, Decreased safety awareness, Decreased mobility, Decreased strength, Difficulty walking, Impaired flexibility, Postural dysfunction  Visit Diagnosis: Other symptoms and signs involving the nervous system  Unsteadiness on feet  Other abnormalities of gait and mobility  Abnormal posture     Problem List  Patient Active Problem List   Diagnosis Date Noted   HOH (hard of hearing) 12/18/2020   Personal history of colonic polyps 08/31/2016   Abnormality of gait 08/27/2014   Memory difficulty 08/27/2014   Atrial fibrillation (HCC) 09/08/2013   Cholelithiases 07/19/2013    Cholelithiasis 07/19/2013   Encounter for therapeutic drug monitoring 07/03/2013   Aortic valve replaced 04/05/2013   Parkinson disease (HCC) 09/27/2012   Orthostatic hypotension 04/01/2012   Pacemaker-Medtronic 03/29/2012   HEPATITIS A 06/04/2010   HYPERTHYROIDISM 06/04/2010   HYPERLIPIDEMIA 06/04/2010   Essential hypertension 06/04/2010   CAD 06/04/2010   AORTIC STENOSIS 06/04/2010   SICK SINUS SYNDROME 06/04/2010   HIATAL HERNIA 06/04/2010   SHORTNESS OF BREATH 06/04/2010   CHEST PAIN 06/04/2010    Drake Leach, PT, DPT  04/29/2021, 9:28 AM  [facility] [address] [place], [place], [address] Phone: [phone]   Fax:  [phone]  Name: Donald [name] MRN: [id number] Date of Birth: [birth year]

## 2021-04-28 NOTE — Patient Instructions (Signed)
   Ways to prevent future Parkinson's related complications:  1.   Exercise regularly.  Perform your therapy exercises and incorporate safe aerobic exercise when possible (swimming, stationary bike, arm bike, seated stepper)  2.   Keep you feet apart when you are standing to allow you to have better balance and reach further (which can help with shoulder rigidity).  Also make sure your feet are apart when you are sitting before you stand up.  When reaching have feet apart and 1 foot in front of the other (staggered)   3.   Focus on BIGGER movements during daily activities- really reach overhead, straighten elbows and extend fingers  4.   Anytime you reach or move shoulder, make sure you have good upright posture.  5.   When dressing or reaching for your seatbelt make sure to use your body to assist by twisting and looking at where you are reaching while you reach--this can help to minimize stress on the shoulder and reduce the risk of a rotator cuff tear  6.  When you reach for something overhead, make sure your thumb is facing up.  This is a better position for your shoulder.  7.  Keep your elbow down when using your hands.  8.  Try using signs/tape to mark where to stand or as reminders to keep feet apart in problem areas.  9.  Slow down!

## 2021-04-28 NOTE — Therapy (Signed)
[facility] [address] [place], [place], [address] Phone: [phone]   Fax:  [phone]  Speech Language Pathology Treatment  Patient Details  Name: Donald [name] MRN: [id number] Date of Birth: Sep 24, 1940 Referring Provider (SLP): Dr. Lurena Joiner Tat   Encounter Date: 04/28/2021   End of Session - 04/28/21 1102     Visit Number 10    Number of Visits 25    Date for SLP Re-Evaluation 05/23/21    SLP Start Time 1020    SLP Stop Time  1055    SLP Time Calculation (min) 35 min    Activity Tolerance Patient tolerated treatment well             Past Medical History:  Diagnosis Date   Abnormality of gait 08/27/2014   Anxiety    Aortic stenosis    s/p AVR by Dr Laneta Simmers   Arthritis    left wrist- probable- not diagonised   Asthma    in the past   CAD (coronary artery disease)    s/p CABG 2006   GERD (gastroesophageal reflux disease)    H/O seasonal allergies    H/O: rheumatic fever    Hearing deficit    Bilateral hearing aids   Hematuria    had CT scan   Hiatal hernia    History of kidney stones    HOH (hard of hearing)    Hyperlipidemia    Hypertension    Hyperthyroidism    Inguinal hernia    right side; watching at this time per wife's report   Memory difficulty 08/27/2014   Pacemaker    Parkinson's disease    Persistent atrial fibrillation (HCC)    Scarlet fever    as a child   Shortness of breath    Sick sinus syndrome (HCC)    s/p PPM (MDT) 2006    Past Surgical History:  Procedure Laterality Date   AORTIC VALVE REPLACEMENT  2006   CARDIOVERSION N/A 09/14/2013   Procedure: CARDIOVERSION;  Surgeon: Thurmon Fair, MD;  Location: [facility];  Service: Cardiovascular;  Laterality: N/A;   CHOLECYSTECTOMY N/A 07/21/2013   Procedure: LAPAROSCOPIC CHOLECYSTECTOMY;  Surgeon: Mariella Saa, MD;  Location: [facility] OR;  Service: General;  Laterality: N/A;   COLONOSCOPY W/ POLYPECTOMY      COLONOSCOPY WITH PROPOFOL N/A 08/31/2016   Procedure: COLONOSCOPY WITH PROPOFOL;  Surgeon: Charlott Rakes, MD;  Location: [facility] END[facility]COPY;  Service: Endoscopy;  Laterality: N/A;   CORONARY ARTERY BYPASS GRAFT  2006   DENTAL SURGERY     implants   INSERT / REPLACE / REMOVE PACEMAKER  2006, 2012   MDT for sick sinus syndrome, gen change 8/12 by JA   KIDNEY STONE SURGERY  1984    There were no vitals filed for this visit.   Subjective Assessment - 04/28/21 1027     Subjective "We did nothing"    Patient is accompained by: Family member    Currently in Pain? No/denies                   ADULT SLP TREATMENT - 04/28/21 1030       General Information   Behavior/Cognition Alert;Cooperative;Requires cueing;Hard of hearing      Treatment Provided   Treatment provided Cognitive-Linquistic      Dysphagia Treatment   Other treatment/comments Donald [name] reports Donald [name] had difficulty eating Ribeye over the weekend cue to grisel . She instructed Donald [name] to cut off the grisel but he did  not. Reviewed his cognitive impairments and that his awareness is poor - Donald [name] continues to tell me he has no trouble swallowing) and that it is time to avoid foods that are difficult and eat a softer cut, such as  fillet,  instead a hader steak and for Donald [name] to cut out hard pieces to eat before giving Donald [name] his plate.      Cognitive-Linquistic Treatment   Treatment focused on Dysarthria;Patient/family/caregiver education    Skilled Treatment Loud AH averaged 90dB, conversation averaged 68dB in personally relevant conversation re: church and volunteer work, In structured speech task, generating 3 sentence descriptions averaged 66dB with usual mod A for volume and breath support. Donald [name] did not practice HW sent home. He did loud Ah's only. Used automatic speech to target loud talking and instructed [name] to at least do automatic speech tasks in the car or during commericals if he can't get to the  structured exercises. Provided rationale for speech exercises as well as loud Ah.      Assessment / Recommendations / Plan   Plan Continue with current plan of care      Progression Toward Goals   Progression toward goals Progressing toward goals              SLP Education - 04/28/21 1059     Education Details HEP of talking, not just loud AH;    Person(s) Educated Patient;Spouse    Methods Explanation;Demonstration;Verbal cues;Handout    Comprehension Verbalized understanding;Returned demonstration;Verbal cues required;Need further instruction              Speech Therapy Progress Note  Dates of Reporting Period: 02/25/21 to 04/28/21  Objective Reports of Subjective Statement: Donald [name] carries over swallow precautions in clinic and maintains improved volume in clinic, however reduced carryover at home per spouse  Objective Measurements: Conversational volume in clinic averaged 66 to 70dB, over s/s of aspiration persist, with Donald [name] requiring cues at home  Goal Update: continue goals  Plan: continue POC  Reason Skilled Services are Required: Donald [name] requires ongoing ST to maximize carryover of volume HEP and swallow precautions outside of the clinic. Spouse requires ongoing education and training for how to cue [name] for volume and swallowing as well as help him avoid difficulty foods, due to his reduced awareness and decision making    SLP Short Term Goals - 04/28/21 1100       SLP SHORT TERM GOAL #1   Title Pt will average 85dB on loud /a/ with occasional min A over 2 sessions.    Baseline 04/16/21, 04/21/21    Time 3    Period Weeks    Status Achieved      SLP SHORT TERM GOAL #2   Title Pt will average 68dB on 18/20 sentences with occasional min A    Baseline 04/16/21    Time 4    Period Weeks    Status Achieved      SLP SHORT TERM GOAL #3   Title Pt/spouse will follow 2 diet modificiations with occasional min A over 3 sessions    Baseline 04/14/21     Time 2    Period Weeks    Status On-going      SLP SHORT TERM GOAL #4   Title Pt will complete HEP for dysphagia with usual min  visual and verbal cues over 2 sessions    Time 5    Period Weeks    Status Achieved      SLP SHORT TERM GOAL #5  Title Pt will average 68dB over 5 minute conversation with occasional min A over 2 sessions    Baseline 04/14/21, 04/21/21    Time 4    Period Weeks    Status Achieved              SLP Long Term Goals - 04/28/21 1101       SLP LONG TERM GOAL #1   Title Pt will average 85dB on loud /a/ with rare min A over 2 sessions    Baseline 04/16/21; 04/28/21    Time 9    Period Weeks    Status Achieved      SLP LONG TERM GOAL #2   Title Pt will average 68dB over 12 minute conversation with occasional min A    Time 8    Period Weeks    Status On-going      SLP LONG TERM GOAL #3   Title Pt will complete HEP for dysarthria and dysphagia with occasional min A over 3 sessions    Time 8    Period Weeks    Status On-going      SLP LONG TERM GOAL #4   Title Pt will follow swallow precautions with occasional min A over 3 sessions    Baseline 04/14/21;    Time 8    Period Weeks    Status On-going      SLP LONG TERM GOAL #5   Title Pt/spouse will verbalize 4 s/s of aspiration pna with rare min A    Time 8    Period Weeks    Status On-going              Plan - 04/28/21 1100     Clinical Impression Statement .Marland Kitchen Donald [name] and Donald [name] report diffculty  communicating due to Donald [name]'s low volume and running his words together. [place] requires min A to maintain volume and simple conversation. Donald [name] follows swallow precautions withsupervision cues in therapy, and inconsistently at home per spouse. Virgel Manifold education re: s/s of aspiration pna. Continue skilled ST to maximize intelligilbity and safety fo swallow.    Speech Therapy Frequency 2x / week    Duration 12 weeks    Treatment/Interventions Language facilitation;Environmental  controls;Cueing hierarchy;SLP instruction and feedback;Pharyngeal strengthening exercises;Compensatory techniques;Cognitive reorganization;Functional tasks;Compensatory strategies;Patient/family education;Multimodal communcation approach;Internal/external aids;Trials of upgraded texture/liquids;Diet toleration management by SLP;Other (comment)    Potential to Achieve Goals Fair    Potential Considerations Medical prognosis;Severity of impairments;Previous level of function             Patient will benefit from skilled therapeutic intervention in order to improve the following deficits and impairments:   Dysarthria and anarthria  Dysphagia, oropharyngeal phase    Problem List Patient Active Problem List   Diagnosis Date Noted   HOH (hard of hearing) 12/18/2020   Personal history of colonic polyps 08/31/2016   Abnormality of gait 08/27/2014   Memory difficulty 08/27/2014   Atrial fibrillation ([place]) 09/08/2013   Cholelithiases 07/19/2013   Cholelithiasis 07/19/2013   Encounter for therapeutic drug monitoring 07/03/2013   Aortic valve replaced 04/05/2013   Parkinson disease ([place]) 09/27/2012   Orthostatic hypotension 04/01/2012   Pacemaker-Medtronic 03/29/2012   HEPATITIS A 06/04/2010   HYPERTHYROIDISM 06/04/2010   HYPERLIPIDEMIA 06/04/2010   Essential hypertension 06/04/2010   CAD 06/04/2010   AORTIC STENOSIS 06/04/2010   SICK SINUS SYNDROME 06/04/2010   HIATAL HERNIA 06/04/2010   SHORTNESS OF BREATH 06/04/2010   CHEST PAIN 06/04/2010    [place], Annye Rusk, [place] 04/28/2021,  12:06 PM  [facility] [address] [place], [place], [address] Phone: [phone]   Fax:  [phone]   Name: Donald [name] MRN: [id number] Date of Birth: Nov 18, 1940

## 2021-04-28 NOTE — Patient Instructions (Signed)
   F[place], you need to practice talking loud at home, not just your loud AH's - talking louder is important to practice this   If  you can't get the talking exercises in, you can recite loudly things you have memorized - prayers, months, days, rhymes, Bible verses etc during commercials or in the car  Or read billboards, street signs, loudly in the car  Read aloud the newspaper 5 minutes loud - sports section

## 2021-04-30 ENCOUNTER — Encounter: Payer: Self-pay | Admitting: Physical Therapy

## 2021-04-30 ENCOUNTER — Ambulatory Visit: Payer: PPO | Admitting: Occupational Therapy

## 2021-04-30 ENCOUNTER — Other Ambulatory Visit: Payer: Self-pay

## 2021-04-30 ENCOUNTER — Ambulatory Visit: Payer: PPO | Admitting: Speech Pathology

## 2021-04-30 ENCOUNTER — Encounter: Payer: Self-pay | Admitting: Speech Pathology

## 2021-04-30 ENCOUNTER — Ambulatory Visit: Payer: PPO | Admitting: Physical Therapy

## 2021-04-30 DIAGNOSIS — R1312 Dysphagia, oropharyngeal phase: Secondary | ICD-10-CM

## 2021-04-30 DIAGNOSIS — R278 Other lack of coordination: Secondary | ICD-10-CM

## 2021-04-30 DIAGNOSIS — R29818 Other symptoms and signs involving the nervous system: Secondary | ICD-10-CM

## 2021-04-30 DIAGNOSIS — R2689 Other abnormalities of gait and mobility: Secondary | ICD-10-CM

## 2021-04-30 DIAGNOSIS — R471 Dysarthria and anarthria: Secondary | ICD-10-CM | POA: Diagnosis not present

## 2021-04-30 DIAGNOSIS — R2681 Unsteadiness on feet: Secondary | ICD-10-CM

## 2021-04-30 DIAGNOSIS — R251 Tremor, unspecified: Secondary | ICD-10-CM

## 2021-04-30 DIAGNOSIS — R41842 Visuospatial deficit: Secondary | ICD-10-CM

## 2021-04-30 DIAGNOSIS — R293 Abnormal posture: Secondary | ICD-10-CM

## 2021-04-30 NOTE — Therapy (Signed)
[facility] [address] [place], [place], [address] Phone: [phone]   Fax:  [phone]  Physical Therapy Treatment  Patient Details  Name: Donald [name] MRN: [id number] Date of Birth: 03/13/1941 Referring Provider (PT): Dr. Carles Collet   Encounter Date: 04/30/2021   PT End of Session - 04/30/21 0807     Visit Number 19    Number of Visits 33    Date for PT Re-Evaluation 07/22/21    Authorization Type HTA - $30 co pay    PT Start Time 0803    PT Stop Time 0845    PT Time Calculation (min) 42 min    Equipment Utilized During Treatment Gait belt    Activity Tolerance Patient tolerated treatment well    Behavior During Therapy [facility] for tasks assessed/performed;Impulsive             Past Medical History:  Diagnosis Date   Abnormality of gait 08/27/2014   Anxiety    Aortic stenosis    s/p AVR by Dr Cyndia Bent   Arthritis    left wrist- probable- not diagonised   Asthma    in the past   CAD (coronary artery disease)    s/p CABG 2006   GERD (gastroesophageal reflux disease)    H/O seasonal allergies    H/O: rheumatic fever    Hearing deficit    Bilateral hearing aids   Hematuria    had CT scan   Hiatal hernia    History of kidney stones    HOH (hard of hearing)    Hyperlipidemia    Hypertension    Hyperthyroidism    Inguinal hernia    right side; watching at this time per wife's report   Memory difficulty 08/27/2014   Pacemaker    Parkinson's disease    Persistent atrial fibrillation ([place])    Scarlet fever    as a child   Shortness of breath    Sick sinus syndrome ([place])    s/p PPM (MDT) 2006    Past Surgical History:  Procedure Laterality Date   AORTIC VALVE REPLACEMENT  2006   CARDIOVERSION N/A 09/14/2013   Procedure: CARDIOVERSION;  Surgeon: Sanda Klein, MD;  Location: [facility];  Service: Cardiovascular;  Laterality: N/A;   CHOLECYSTECTOMY N/A 07/21/2013   Procedure: LAPAROSCOPIC  CHOLECYSTECTOMY;  Surgeon: Edward Jolly, MD;  Location: [facility];  Service: General;  Laterality: N/A;   COLONOSCOPY W/ POLYPECTOMY     COLONOSCOPY WITH PROPOFOL N/A 08/31/2016   Procedure: COLONOSCOPY WITH PROPOFOL;  Surgeon: Wilford Corner, MD;  Location: [facility];  Service: Endoscopy;  Laterality: N/A;   CORONARY ARTERY BYPASS GRAFT  2006   DENTAL SURGERY     implants   INSERT / REPLACE / REMOVE PACEMAKER  2006, 2012   MDT for sick sinus syndrome, gen change 8/12 by [place]    There were no vitals filed for this visit.   Subjective Assessment - 04/30/21 0806     Subjective No changes, no falls.    Pertinent History PMH: PD, CAD (s/p CABG 2006), LD, HTN, a fib, pacemaker, GERD, on anticoagulants    Limitations Walking;House hold activities;Standing    Patient Stated Goals wants to improve his balance    Currently in Pain? No/denies                               [facility]  Adult PT Treatment/Exercise - 04/30/21 0825       Transfers   Transfers Sit to Stand;Stand to Sit    Sit to Stand 5: Supervision    Stand to Sit 5: Supervision    Comments Performed during session with cues to scoot out towards edge, incr forward lean and wider BOS.      Ambulation/Gait   Ambulation/Gait Yes    Ambulation/Gait Assistance 4: Min guard    Ambulation/Gait Assistance Details Continued to use laser as visual cue with verbal/demo cues throughout for incr stride length and foot clearance/heel strike throughout, esp when going around curves in the therapy gym where pt has tendency to take too small of a step with his RLE. pt's spouse reports that they bought a clip on light to put on his RW for visual cue. Will bring it in to next session.    Ambulation Distance (Feet) 230 Feet    Assistive device Rolling walker    Gait Pattern Step-through pattern;Decreased step length - right;Decreased step length - left    Ambulation Surface Level;Indoor    Gait  Comments With RW ambulating around obstacles for wider turns, performed down and back 4 reps over approx. 20'. Pt with a few festinating episodes when going around obstacles. Pt needing cues each time to STOP and reset, get his heels on the ground and restart with taking a big step. Verbal/demo cues throughout to maintain stride length/foot clearance.                 Balance Exercises - 04/30/21 0844       Balance Exercises: Standing   Stepping Strategy Lateral;Anterior;Limitations    Stepping Strategy Limitations In anterior direction: Over 2" foam beam - x15 reps each side with cues for foot clearance/heel strike and weight shift. Pt with incr difficulty performing this with RLE. In lateral direction: x10 reps over 2" beam, then x10 reps over 4" beam with cues to slow down pace and incr foot clearance esp with LLE.    Other Standing Exercises Stepping over yardsticks in // bars, forward gait with focus on slowed pace, foot clearance and heel strike. When turning cues for marching for incr foot clearance.    Other Standing Exercises Comments Forwards and retro gait in // bars-cues for heel strike when going forwards and incr step length/weight shift stepping backwards (pt still with tendency to be on his toes). Put yard sticks in the middle for wider BOS when stepping backwards, down and back x2 reps.                  PT Short Term Goals - 04/23/21 1155       PT SHORT TERM GOAL #1   Title ALL STGS = LTGS               PT Long Term Goals - 04/23/21 1220       PT LONG TERM GOAL #1   Title Pt will perform final HEP with supervision from pt's spouse for strength, balance, and transfers in order to build upon functional gains made in therapy. ALL LTGS DUE 05/21/21    Baseline will continue to benefit from reviews/updates to HEP going forwards.    Time 4    Period Weeks    Status On-going    Target Date 05/21/21      PT LONG TERM GOAL #2   Title Pt will ambulate 84'  with RW and supervision over level indoor surfaces with use  of laser as visual cue and pt's spouse giving cues to pt for improved household and short community distances.    Baseline 345' with RW with min uard, incr festination episodes    Time 4    Period Weeks    Status Revised      PT LONG TERM GOAL #3   Title Pt will be able to perform 10 sit <> stands with BUE support with proper technique and no episodes of retropulson in order to demo improved balance/functional transfers.    Baseline 5x sit <> stand: 14.82 seconds with no UE support, more controlled descent today.    Time 4    Period Weeks    Status Revised      PT LONG TERM GOAL #4   Title Pt and pt's spouse will verbalize understanding/cues for strategies to assist with freezing/festination episodes at home.    Time 4    Period Weeks    Status New                   Plan - 04/30/21 1259     Clinical Impression Statement Continued to use laser as visual cue attached to laser for incr stride length/foot clearance. When going around curves, did need cues for incr stride length esp with RLE. When performing obstacle negotiation, pt with a few episodes of festination with cues to stop and reset before continuing gait. Remainder of session focused on standing balance strategies focusing on weight shifting, foot clearance, and stepping. Needing cues for slowed pace at times. Will continue to progress towards LTGs.    Personal Factors and Comorbidities Comorbidity 3+;Time since onset of injury/illness/exacerbation;Past/Current Experience;Behavior Pattern    Comorbidities PD (diagnosed 2012), CAD (s/p CABG 2006), LD, HTN, a fib, pacemaker, GERD, on anticoagulants    Examination-Activity Limitations Bed Mobility;Caring for Others;Toileting;Transfers;Stairs;Locomotion Level;Stand;Squat;Reach Overhead    Designer, fashion/clothing;Yard Work    [employer]chant [employer] officer  Evolving/Moderate complexity    Rehab Potential Good    PT Frequency 2x / week    PT Duration 8 weeks    PT Treatment/Interventions ADLs/Self Care Home Management;DME Instruction;Therapeutic activities;Functional mobility training;Gait training;Stair training;Therapeutic exercise;Balance training;Neuromuscular re-education;Patient/family education;Energy conservation;Vestibular    PT Next Visit Plan will need 10th visit PN. review stepping strategies to HEP.  Pt is very hard of hearing even with hearing aids (need to wear clear mask). gait trianing with RW using red tband as big kicks/steps to the red. try again attaching head lamp/laser for incr stride length.  standing foot clearance and weight shifting. Review techniques/cues for wife to perform with gait, transfers and freezing episodes.    PT Home Exercise Plan [id number]    Consulted and Agree with Plan of Care Patient;Family member/caregiver    Family Member Consulted pt's [name]             Patient will benefit from skilled therapeutic intervention in order to improve the following deficits and impairments:  Abnormal gait, Decreased activity tolerance, Decreased coordination, Decreased balance, Decreased endurance, Decreased knowledge of use of DME, Decreased safety awareness, Decreased mobility, Decreased strength, Difficulty walking, Impaired flexibility, Postural dysfunction  Visit Diagnosis: Other symptoms and signs involving the nervous system  Unsteadiness on feet  Other abnormalities of gait and mobility     Problem List Patient Active Problem List   Diagnosis Date Noted   HOH (hard of hearing) 12/18/2020   Personal history of colonic polyps 08/31/2016   Abnormality of gait 08/27/2014   Memory difficulty 08/27/2014  Atrial fibrillation (H[facility]) 09/08/2013   Cholelithiases 07/19/2013   Cholelithiasis 07/19/2013   Encounter for therapeutic drug monitoring 07/03/2013   Aortic valve replaced 04/05/2013   Parkinson  disease (HCC) 09/27/2012   Orthostatic hypotension 04/01/2012   Pacemaker-Medtronic 03/29/2012   HEPATITIS A 06/04/2010   HYPERTHYROIDISM 06/04/2010   HYPERLIPIDEMIA 06/04/2010   Essential hypertension 06/04/2010   CAD 06/04/2010   AORTIC STENOSIS 06/04/2010   SICK SINUS SYNDROME 06/04/2010   HIATAL HERNIA 06/04/2010   SHORTNESS OF BREATH 06/04/2010   CHEST PAIN 06/04/2010    Drake Leach, PT, DPT  04/30/2021, 1:02 PM  [facility] [address] [place], [place], [address] Phone: [phone]   Fax:  [phone]  Name: LATRAVION GRAVES MRN: [id number] Date of Birth: 01/24/1941

## 2021-04-30 NOTE — Therapy (Signed)
[facility] [address] [place], [place], [address] Phone: [phone]   Fax:  [phone]  Speech Language Pathology Treatment  Patient Details  Name: Donald [name] MRN: [id number] Date of Birth: 14-Dec-1940 Referring Provider (SLP): Dr. Lurena Joiner Tat   Encounter Date: 04/30/2021   End of Session - 04/30/21 1617     Visit Number 11    Number of Visits 25    Date for SLP Re-Evaluation 05/23/21    SLP Start Time 1018    SLP Stop Time  1050   pt requested to leave early due to a church meeting   SLP Time Calculation (min) 32 min    Activity Tolerance Patient tolerated treatment well             Past Medical History:  Diagnosis Date   Abnormality of gait 08/27/2014   Anxiety    Aortic stenosis    s/p AVR by Dr Laneta Simmers   Arthritis    left wrist- probable- not diagonised   Asthma    in the past   CAD (coronary artery disease)    s/p CABG 2006   GERD (gastroesophageal reflux disease)    H/O seasonal allergies    H/O: rheumatic fever    Hearing deficit    Bilateral hearing aids   Hematuria    had CT scan   Hiatal hernia    History of kidney stones    HOH (hard of hearing)    Hyperlipidemia    Hypertension    Hyperthyroidism    Inguinal hernia    right side; watching at this time per wife's report   Memory difficulty 08/27/2014   Pacemaker    Parkinson's disease    Persistent atrial fibrillation (HCC)    Scarlet fever    as a child   Shortness of breath    Sick sinus syndrome (H[facility])    s/p PPM (MDT) 2006    Past Surgical History:  Procedure Laterality Date   AORTIC VALVE REPLACEMENT  2006   CARDIOVERSION N/A 09/14/2013   Procedure: CARDIOVERSION;  Surgeon: Thurmon Fair, MD;  Location: [facility];  Service: Cardiovascular;  Laterality: N/A;   CHOLECYSTECTOMY N/A 07/21/2013   Procedure: LAPAROSCOPIC CHOLECYSTECTOMY;  Surgeon: Mariella Saa, MD;  Location: [facility] OR;  Service: General;   Laterality: N/A;   COLONOSCOPY W/ POLYPECTOMY     COLONOSCOPY WITH PROPOFOL N/A 08/31/2016   Procedure: COLONOSCOPY WITH PROPOFOL;  Surgeon: Charlott Rakes, MD;  Location: [facility] END[facility];  Service: Endoscopy;  Laterality: N/A;   CORONARY ARTERY BYPASS GRAFT  2006   DENTAL SURGERY     implants   INSERT / REPLACE / REMOVE PACEMAKER  2006, 2012   MDT for sick sinus syndrome, gen change 8/12 by [facility]   KIDNEY STONE SURGERY  1984    There were no vitals filed for this visit.   Subjective Assessment - 04/30/21 1032     Subjective "I brought it in" re: sports articles    Patient is accompained by: Family member    Currently in Pain? No/denies                   ADULT SLP TREATMENT - 04/30/21 1033       General Information   Behavior/Cognition Alert;Cooperative;Requires cueing;Hard of hearing      Treatment Provided   Treatment provided Cognitive-Linquistic      Dysphagia Treatment   Temperature Spikes Noted No    Respiratory Status Room air  Oral Cavity - Dentition Adequate natural dentition    Treatment Methods Skilled observation;Therapeutic exercise;Compensation strategy training    Type of PO's observed Thin liquids;Dysphagia 3 (soft)    Liquids provided via Cup    Pharyngeal Phase Signs & Symptoms Audible swallow;Immediate cough;Delayed cough    Type of cueing Verbal    Amount of cueing Minimal    Other treatment/comments Donald [name] alternate solids and liquids with supervision cues. 1x he took liquid before swallowing solid. Donald [name] noticed this and pointed it out to Donald [name]. Donald [name] continues to reqiured mod A to remember to swallow after coughing episodes. HEP effortful swallow with rare min A, masako with usual min A, CTAR with usual min A      Cognitive-Linquistic Treatment   Treatment focused on Dysarthria;Patient/family/caregiver education    Skilled Treatment Pt brought in 2 sports articles to read - they have to leave early, so he complete 3 loud AH's and read 5  paragraphs with 70dB with occasional min visual cues. Donald [name] required cues to swallow due to wet voice quality      Assessment / Recommendations / Plan   Plan Continue with current plan of care      Progression Toward Goals   Progression toward goals Progressing toward goals              SLP Education - 04/30/21 1049     Education Details swallow after coughing              SLP Short Term Goals - 04/30/21 1617       SLP SHORT TERM GOAL #1   Title Pt will average 85dB on loud /a/ with occasional min A over 2 sessions.    Baseline 04/16/21, 04/21/21    Time 3    Period Weeks    Status Achieved      SLP SHORT TERM GOAL #2   Title Pt will average 68dB on 18/20 sentences with occasional min A    Baseline 04/16/21    Time 4    Period Weeks    Status Achieved      SLP SHORT TERM GOAL #3   Title Pt/spouse will follow 2 diet modificiations with occasional min A over 3 sessions    Baseline 04/14/21    Time 2    Period Weeks    Status On-going      SLP SHORT TERM GOAL #4   Title Pt will complete HEP for dysphagia with usual min  visual and verbal cues over 2 sessions    Time 5    Period Weeks    Status Achieved      SLP SHORT TERM GOAL #5   Title Pt will average 68dB over 5 minute conversation with occasional min A over 2 sessions    Baseline 04/14/21, 04/21/21    Time 4    Period Weeks    Status Achieved              SLP Long Term Goals - 04/30/21 1617       SLP LONG TERM GOAL #1   Title Pt will average 85dB on loud /a/ with rare min A over 2 sessions    Baseline 04/16/21; 04/28/21    Time 9    Period Weeks    Status Achieved      SLP LONG TERM GOAL #2   Title Pt will average 68dB over 12 minute conversation with occasional min A    Time 8    Period Weeks  Status On-going      SLP LONG TERM GOAL #3   Title Pt will complete HEP for dysarthria and dysphagia with occasional min A over 3 sessions    Time 8    Period Weeks    Status On-going       SLP LONG TERM GOAL #4   Title Pt will follow swallow precautions with occasional min A over 3 sessions    Baseline 04/14/21;    Time 8    Period Weeks    Status On-going      SLP LONG TERM GOAL #5   Title Pt/spouse will verbalize 4 s/s of aspiration pna with rare min A    Time 8    Period Weeks    Status On-going              Plan - 04/30/21 1616     Clinical Impression Statement .Marland Kitchen Donald [name] report diffculty  communicating due to Donald [name]'s low volume and running his words together. Donald [name] requires min A to maintain volume and simple conversation. Donald [name] follows swallow precautions withsupervision cues in therapy, and inconsistently at home per spouse. Marland KitchenHe requires cues to swallow after coughing. ongoing cues to swallow after   regular cues to swallow after coughing episodes.  Donald [name] education re: s/s of aspiration pna. Continue skilled ST to maximize intelligilbity and safety fo swallow.    Speech Therapy Frequency 2x / week    Duration 12 weeks    Treatment/Interventions Language facilitation;Environmental controls;Cueing hierarchy;SLP instruction and feedback;Pharyngeal strengthening exercises;Compensatory techniques;Cognitive reorganization;Functional tasks;Compensatory strategies;Patient/family education;Multimodal communcation approach;Internal/external aids;Trials of upgraded texture/liquids;Diet toleration management by SLP;Other (comment)    Potential to Achieve Goals Fair    Potential Considerations Medical prognosis;Severity of impairments;Previous level of function             Patient will benefit from skilled therapeutic intervention in order to improve the following deficits and impairments:   Dysphagia, oropharyngeal phase  Dysarthria and anarthria    Problem List Patient Active Problem List   Diagnosis Date Noted   HOH (hard of hearing) 12/18/2020   Personal history of colonic polyps 08/31/2016   Abnormality of gait 08/27/2014   Memory  difficulty 08/27/2014   Atrial fibrillation ([place]) 09/08/2013   Cholelithiases 07/19/2013   Cholelithiasis 07/19/2013   Encounter for therapeutic drug monitoring 07/03/2013   Aortic valve replaced 04/05/2013   Parkinson disease ([place]) 09/27/2012   Orthostatic hypotension 04/01/2012   Pacemaker-Medtronic 03/29/2012   HEPATITIS A 06/04/2010   HYPERTHYROIDISM 06/04/2010   HYPERLIPIDEMIA 06/04/2010   Essential hypertension 06/04/2010   CAD 06/04/2010   AORTIC STENOSIS 06/04/2010   SICK SINUS SYNDROME 06/04/2010   HIATAL HERNIA 06/04/2010   SHORTNESS OF BREATH 06/04/2010   CHEST PAIN 06/04/2010    Liela Rylee, Annye Rusk, CCC-SLP 04/30/2021, 4:18 PM  [facility] [address] [place], [place], [address] Phone: [phone]   Fax:  [phone]   Name: Donald [name] MRN: [id number] Date of Birth: 1940/06/10

## 2021-04-30 NOTE — Patient Instructions (Signed)
° °  When you Cough you have to swallow - cough it up then swallow it down  Get a swallow bracelet to help remind you to swallow

## 2021-04-30 NOTE — Therapy (Signed)
[facility] [address] [place], [place], [address] Phone: [phone]   Fax:  [phone]  Occupational Therapy Treatment  Patient Details  Name: Donald [name] MRN: [id number] Date of Birth: [birth year] Referring Provider (OT): Dr. Wells Guiles Tat   Encounter Date: 04/30/2021   OT End of Session - 04/30/21 1005     Visit Number 14    Number of Visits 21    Date for OT Re-Evaluation 05/28/21    Authorization Type [employer], follow Medicare guidelines, copay per day    Authorization - Visit Number 14    Authorization - Number of Visits 20    Progress Note Due on Visit 20    OT Start Time 0850    OT Stop Time 0930    OT Time Calculation (min) 40 min    Behavior During Therapy [facility] for tasks assessed/performed;Impulsive             Past Medical History:  Diagnosis Date   Abnormality of gait 08/27/2014   Anxiety    Aortic stenosis    s/p AVR by Dr Cyndia Bent   Arthritis    left wrist- probable- not diagonised   Asthma    in the past   CAD (coronary artery disease)    s/p CABG 2006   GERD (gastroesophageal reflux disease)    H/O seasonal allergies    H/O: rheumatic fever    Hearing deficit    Bilateral hearing aids   Hematuria    had CT scan   Hiatal hernia    History of kidney stones    HOH (hard of hearing)    Hyperlipidemia    Hypertension    Hyperthyroidism    Inguinal hernia    right side; watching at this time per wife's report   Memory difficulty 08/27/2014   Pacemaker    Parkinson's disease    Persistent atrial fibrillation ([place])    Scarlet fever    as a child   Shortness of breath    Sick sinus syndrome ([place])    s/p PPM (MDT) 2006    Past Surgical History:  Procedure Laterality Date   AORTIC VALVE REPLACEMENT  2006   CARDIOVERSION N/A 09/14/2013   Procedure: CARDIOVERSION;  Surgeon: Sanda Klein, MD;  Location: [facility];  Service: Cardiovascular;  Laterality: N/A;    CHOLECYSTECTOMY N/A 07/21/2013   Procedure: LAPAROSCOPIC CHOLECYSTECTOMY;  Surgeon: Edward Jolly, MD;  Location: [facility];  Service: General;  Laterality: N/A;   COLONOSCOPY W/ POLYPECTOMY     COLONOSCOPY WITH PROPOFOL N/A 08/31/2016   Procedure: COLONOSCOPY WITH PROPOFOL;  Surgeon: Wilford Corner, MD;  Location: [facility] ENDOSCOPY;  Service: Endoscopy;  Laterality: N/A;   CORONARY ARTERY BYPASS GRAFT  2006   DENTAL SURGERY     implants   INSERT / REPLACE / REMOVE PACEMAKER  2006, 2012   MDT for sick sinus syndrome, gen change 8/12 by [place]    There were no vitals filed for this visit.   Subjective Assessment - 04/30/21 1005     Subjective  "I've had 55 falls and haven't gotten hurt yet"    Pertinent History Parkinson's Disease.   PMH:  hx of falls (wears helment), arthritis, asthma, CAD, GERD, HOH, HLD, HTN, hyperthyroidism, anxiety, aortic stenosis, hx of aortic valve replacement 2006, memory difficulty, pacemaker, persistent a-fib, hx of scarlet fever, shortness of breath, hx of CABG 2006, cholecystectomy 2015, neuropathy    Limitations fall  risk (wears helment and pads), freezing, hard of hearing--reads lips (**wear clear mask)    Currently in Pain? No/denies                Treatment: Seated functional reaching with trunk rotation followed by standing to perform functional reach with trunk rotation, feet staggered, min facilitation/ v.c Closed chain shoulder flexion floor to overhead with pt following ball with his eyes, mod demonstrative and  v.c Placing and removing medium sized pegs from pegboard, min-mod verbal and tactile cues to avoid compensation and shoulder abduction, min-mod difficulty, pt demonstrates improved dexterity. Discussion with pt/ wife about safety for fall reduction. Pt demonstrates decreased safety awareness reporting" I've fallen 55 times and haven't gotten hurt".  Therapist reinforced that it only takes one bad fall to get hurt,  pt's wife agreed.                    OT Short Term Goals - 04/28/21 1449       OT SHORT TERM GOAL #1   Title Pt will perform PD-specific HEP with cueing prn from caregiver--check STGs 03/27/21    Time 4    Period Weeks    Status Achieved      OT SHORT TERM GOAL #2   Title Pt will improve R hand coordination for ADLs (buttoning/tying shoes) as shown by completing 9-hole peg test in 75sec or less.    Baseline 88.31sec    Time 4    Period Weeks    Status On-going   04/23/21 1 min 42 secs     OT SHORT TERM GOAL #3   Title Pt will improve L hand coordination for ADLs (buttoning/tying shoes) as shown by completing 9-hole peg test in 90 sec or less.    Baseline 108.94sec    Time 4    Period Weeks    Status On-going   04/23/21:> 2 mins     OT SHORT TERM GOAL #4   Title Pt/caregiver will verbalize understanding of ways to decr risk of future complcations related to PD (including falls).    Time 4    Period Weeks    Status Achieved               OT Long Term Goals - 04/28/21 1449       OT LONG TERM GOAL #1   Title Pt/caregiver will verbalize understanding of AE/strategies to incr ease, safety, and independence with ADLs/IADLs.--check LTGs 04/26/21    Status On-going      OT LONG TERM GOAL #2   Title Pt will improve R hand coordination for ADLs (buttoning/tying shoes) as shown by completing 9-hole peg test in 65sec or less.    Status On-going      OT LONG TERM GOAL #3   Title Pt will improve L hand coordination for ADLs (buttoning/tying shoes) as shown by completing 9-hole peg test in 75 sec or less.    Status On-going   04/23/21 >2 mins     OT LONG TERM GOAL #4   Title Pt will improve functional reaching/coordination for ADLs as shown by improving score on box and blocks test by at least 5 with dominant LUE.    Status On-going   04/23/21-35 blocks, 3 block increase     OT LONG TERM GOAL #5   Title Pt/caregiver will verbalize understanding of appropriate  community resources.    Status Achieved  Plan - 04/30/21 1006     Clinical Impression Statement Pt is progressing slowly towards goals due to severity of deficits. Pt and wife are in agreement with plans for d/c next week and return evals in 6 mons.    OT Occupational Profile and History Detailed Assessment- Review of Records and additional review of physical, cognitive, psychosocial history related to current functional performance    Occupational performance deficits (Please refer to evaluation for details): ADL's;IADL's;Leisure    Body Structure / Function / Physical Skills Balance;ADL;Decreased knowledge of use of DME;UE functional use;IADL;Improper spinal/pelvic alignment;Vision;Mobility;Coordination;Decreased knowledge of precautions;FMC    Rehab Potential Good    Clinical Decision Making Several treatment options, min-mod task modification necessary    Comorbidities Affecting Occupational Performance: May have comorbidities impacting occupational performance    Modification or Assistance to Complete Evaluation  Min-Moderate modification of tasks or assist with assess necessary to complete eval    OT Frequency 2x / week    OT Duration 4 weeks    OT Treatment/Interventions Self-care/ADL training;Moist Heat;DME and/or AE instruction;Therapeutic activities;Therapeutic exercise;Cognitive remediation/compensation;Visual/perceptual remediation/compensation;Passive range of motion;Functional Mobility Training;Neuromuscular education;Cryotherapy;Energy conservation;Manual Therapy;Patient/family education    Plan anticipate d/c next week, complete pt / caregiver education start checking goals    Consulted and Agree with Plan of Care Patient;Family member/caregiver    Family Member Consulted wife             Patient will benefit from skilled therapeutic intervention in order to improve the following deficits and impairments:   Body Structure / Function / Physical  Skills: Balance, ADL, Decreased knowledge of use of DME, UE functional use, IADL, Improper spinal/pelvic alignment, Vision, Mobility, Coordination, Decreased knowledge of precautions, FMC       Visit Diagnosis: Other symptoms and signs involving the nervous system  Unsteadiness on feet  Other abnormalities of gait and mobility  Abnormal posture  Other lack of coordination  Visuospatial deficit  Tremor    Problem List Patient Active Problem List   Diagnosis Date Noted   HOH (hard of hearing) 12/18/2020   Personal history of colonic polyps 08/31/2016   Abnormality of gait 08/27/2014   Memory difficulty 08/27/2014   Atrial fibrillation (H[facility]) 09/08/2013   Cholelithiases 07/19/2013   Cholelithiasis 07/19/2013   Encounter for therapeutic drug monitoring 07/03/2013   Aortic valve replaced 04/05/2013   Parkinson disease (HCC) 09/27/2012   Orthostatic hypotension 04/01/2012   Pacemaker-Medtronic 03/29/2012   HEPATITIS A 06/04/2010   HYPERTHYROIDISM 06/04/2010   HYPERLIPIDEMIA 06/04/2010   Essential hypertension 06/04/2010   CAD 06/04/2010   AORTIC STENOSIS 06/04/2010   SICK SINUS SYNDROME 06/04/2010   HIATAL HERNIA 06/04/2010   SHORTNESS OF BREATH 06/04/2010   CHEST PAIN 06/04/2010    Gabi Mcfate, OT 04/30/2021, 10:09 AM  [facility] [address] [place], [place], [address] Phone: [phone]   Fax:  [phone]  Name: Donald [name] MRN: [id number] Date of Birth: 10-18-1940

## 2021-05-05 ENCOUNTER — Ambulatory Visit: Payer: PPO | Admitting: Physical Therapy

## 2021-05-05 ENCOUNTER — Encounter: Payer: Self-pay | Admitting: Physical Therapy

## 2021-05-05 ENCOUNTER — Other Ambulatory Visit: Payer: Self-pay

## 2021-05-05 ENCOUNTER — Encounter: Payer: Self-pay | Admitting: Occupational Therapy

## 2021-05-05 ENCOUNTER — Ambulatory Visit: Payer: PPO | Admitting: Occupational Therapy

## 2021-05-05 ENCOUNTER — Ambulatory Visit: Payer: PPO

## 2021-05-05 DIAGNOSIS — R29818 Other symptoms and signs involving the nervous system: Secondary | ICD-10-CM

## 2021-05-05 DIAGNOSIS — R293 Abnormal posture: Secondary | ICD-10-CM

## 2021-05-05 DIAGNOSIS — R2681 Unsteadiness on feet: Secondary | ICD-10-CM

## 2021-05-05 DIAGNOSIS — R2689 Other abnormalities of gait and mobility: Secondary | ICD-10-CM

## 2021-05-05 DIAGNOSIS — R471 Dysarthria and anarthria: Secondary | ICD-10-CM

## 2021-05-05 DIAGNOSIS — R41842 Visuospatial deficit: Secondary | ICD-10-CM

## 2021-05-05 DIAGNOSIS — R278 Other lack of coordination: Secondary | ICD-10-CM

## 2021-05-05 DIAGNOSIS — R251 Tremor, unspecified: Secondary | ICD-10-CM

## 2021-05-05 NOTE — Therapy (Addendum)
[facility] [address] [place], [place], [address] Phone: [phone]   Fax:  [phone]  Physical Therapy Treatment/10th Visit Progress Note  Patient Details  Name: Donald [name] MRN: [id number] Date of Birth: 17-Jan-1941 Referring Provider (PT): Dr. Carles Collet  10th Visit Physical Therapy Progress Note  Dates of Reporting Period: 03/31/21 to 05/05/21    Encounter Date: 05/05/2021   PT End of Session - 05/05/21 0937     Visit Number 20    Number of Visits 33    Date for PT Re-Evaluation 07/22/21    Authorization Type HTA - $30 co pay    PT Start Time 0933    PT Stop Time 1015    PT Time Calculation (min) 42 min    Equipment Utilized During Treatment Gait belt    Activity Tolerance Patient tolerated treatment well    Behavior During Therapy [facility] for tasks assessed/performed;Impulsive             Past Medical History:  Diagnosis Date   Abnormality of gait 08/27/2014   Anxiety    Aortic stenosis    s/p AVR by Dr Cyndia Bent   Arthritis    left wrist- probable- not diagonised   Asthma    in the past   CAD (coronary artery disease)    s/p CABG 2006   GERD (gastroesophageal reflux disease)    H/O seasonal allergies    H/O: rheumatic fever    Hearing deficit    Bilateral hearing aids   Hematuria    had CT scan   Hiatal hernia    History of kidney stones    HOH (hard of hearing)    Hyperlipidemia    Hypertension    Hyperthyroidism    Inguinal hernia    right side; watching at this time per wife's report   Memory difficulty 08/27/2014   Pacemaker    Parkinson's disease    Persistent atrial fibrillation ([place])    Scarlet fever    as a child   Shortness of breath    Sick sinus syndrome ([place])    s/p PPM (MDT) 2006    Past Surgical History:  Procedure Laterality Date   AORTIC VALVE REPLACEMENT  2006   CARDIOVERSION N/A 09/14/2013   Procedure: CARDIOVERSION;  Surgeon: Sanda Klein, MD;  Location: [facility]  ENDOSCOPY;  Service: Cardiovascular;  Laterality: N/A;   CHOLECYSTECTOMY N/A 07/21/2013   Procedure: LAPAROSCOPIC CHOLECYSTECTOMY;  Surgeon: Edward Jolly, MD;  Location: [facility];  Service: General;  Laterality: N/A;   COLONOSCOPY W/ POLYPECTOMY     COLONOSCOPY WITH PROPOFOL N/A 08/31/2016   Procedure: COLONOSCOPY WITH PROPOFOL;  Surgeon: Wilford Corner, MD;  Location: [facility] ENDOSCOPY;  Service: Endoscopy;  Laterality: N/A;   CORONARY ARTERY BYPASS GRAFT  2006   DENTAL SURGERY     implants   INSERT / REPLACE / REMOVE PACEMAKER  2006, 2012   MDT for sick sinus syndrome, gen change 8/12 by [place]    There were no vitals filed for this visit.   Subjective Assessment - 05/05/21 0937     Subjective No changes, no falls.    Pertinent History PMH: PD, CAD (s/p CABG 2006), LD, HTN, a fib, pacemaker, GERD, on anticoagulants    Limitations Walking;House hold activities;Standing    Patient Stated Goals wants to improve his balance    Currently in Pain? No/denies  [facility] PT Treatment/Exercise - 05/05/21 1011       Transfers   Transfers Sit to Stand;Stand to Sit    Sit to Stand 5: Supervision    Stand to Sit 5: Supervision    Comments x10 reps sit <> stands with initial reminder cues to scoot out towards edge and incr forwards lean to stand. Needs these cues throughout session intermittently for proper technique and to decr retropulsion      Ambulation/Gait   Ambulation/Gait Yes    Ambulation/Gait Assistance 4: Min guard    Ambulation/Gait Assistance Details Used red tband on pt's RW today for visual cue (had it raised on RW so pt wouldn't worry about getting feet caught in it). Pt's wife ordered a flashlight to attach to pt's RW for visual cue on the floor, however it did not show up on the floor and discussed that a laser target would be better as a cue. Cues throughout for big strides to the red, esp while  going around curves. Pt able to ambulate 345' around the gym without any episodes of festination today. At end of session reviewed cues for gait with pt/spouse for safety and to help with festination episodes to decr fall risk.    Ambulation Distance (Feet) 345 Feet    Assistive device Rolling walker    Gait Pattern Step-through pattern;Decreased step length - right;Decreased step length - left    Ambulation Surface Level;Indoor    Gait Comments Working on gait through doorways, performed x8 reps total throughout session. Educated on getting to doorway, stopping and resetting with tall posture and using a visual target ahead to focus on taking big steps to to help with festination/shuffling episodes through door ways. Pt did well with this cue                 Balance Exercises - 05/05/21 1012       Balance Exercises: Standing   Stepping Strategy Posterior;Lateral;10 reps;UE support;Limitations    Stepping Strategy Limitations With use of colorful floor disc as visual cue on where stance leg to stay and for where opposite foot should step, BUE support at countertop, peformed posterior, lateral, posterior diagonal direction x12 reps of each with each leg. Pt needing cues at times to slow down movement, and perform full weight shift and foot clearance when stepping back to midline.                  PT Short Term Goals - 04/23/21 1155       PT SHORT TERM GOAL #1   Title ALL STGS = LTGS               PT Long Term Goals - 05/05/21 1423       PT LONG TERM GOAL #1   Title Pt will perform final HEP with supervision from pt's spouse for strength, balance, and transfers in order to build upon functional gains made in therapy. ALL LTGS DUE 05/21/21    Baseline will continue to benefit from reviews/updates to HEP going forwards.    Time 4    Period Weeks    Status On-going    Target Date 05/21/21      PT LONG TERM GOAL #2   Title Pt will ambulate 42' with RW and  supervision over level indoor surfaces with use of laser as visual cue and pt's spouse giving cues to pt for improved household and short community distances.    Baseline 345' with RW  with min uard, incr festination episodes    Time 4    Period Weeks    Status Revised      PT LONG TERM GOAL #3   Title Pt will be able to perform 10 sit <> stands with BUE support with proper technique and no episodes of retropulson in order to demo improved balance/functional transfers.    Baseline met on 05/05/21    Time 4    Period Weeks    Status Achieved      PT LONG TERM GOAL #4   Title Pt and pt's spouse will verbalize understanding/cues for strategies to assist with freezing/festination episodes at home.    Time 4    Period Weeks    Status New                   Plan - 05/05/21 1436     Clinical Impression Statement 10th visit progress note: While ambulating 345' around gym today with RW, pt demonstrated no episodes of festination episodes. Did need intermittent cues to maintain stride length towards visual cue, esp going around curves. Worked on gait through doorways to help with festination episodes. Cued to stop, reset and use a visual target ahead on what to step through to maintain stride length. Educated wife on these cues as well. When pt is initially cued for technique, pt is able to perform 10 reps of sit <> stands with no retropulsion with BUE support from mat. Will check remainder of LTGs at next session and will anticipate D/C with return evals in 6 months.    Personal Factors and Comorbidities Comorbidity 3+;Time since onset of injury/illness/exacerbation;Past/Current Experience;Behavior Pattern    Comorbidities PD (diagnosed 2012), CAD (s/p CABG 2006), LD, HTN, a fib, pacemaker, GERD, on anticoagulants    Examination-Activity Limitations Bed Mobility;Caring for Others;Toileting;Transfers;Stairs;Locomotion Level;Stand;Squat;Reach Overhead    Conservator, museum/gallery;Yard Work    [employer]chant [employer] officer Evolving/Moderate complexity    Rehab Potential Good    PT Frequency 2x / week    PT Duration 8 weeks    PT Treatment/Interventions ADLs/Self Care Home Management;DME Instruction;Therapeutic activities;Functional mobility training;Gait training;Stair training;Therapeutic exercise;Balance training;Neuromuscular re-education;Patient/family education;Energy conservation;Vestibular    PT Next Visit Plan Review HEP and D/C. Review techniques/cues for wife to perform with gait, transfers and freezing episodes. Schedule eval in 6 months.    PT Home Exercise Plan [id number]    Consulted and Agree with Plan of Care Patient;Family member/caregiver    Family Member Consulted pt's [name]             Patient will benefit from skilled therapeutic intervention in order to improve the following deficits and impairments:  Abnormal gait, Decreased activity tolerance, Decreased coordination, Decreased balance, Decreased endurance, Decreased knowledge of use of DME, Decreased safety awareness, Decreased mobility, Decreased strength, Difficulty walking, Impaired flexibility, Postural dysfunction  Visit Diagnosis: Other symptoms and signs involving the nervous system  Unsteadiness on feet  Other abnormalities of gait and mobility  Abnormal posture     Problem List Patient Active Problem List   Diagnosis Date Noted   HOH (hard of hearing) 12/18/2020   Personal history of colonic polyps 08/31/2016   Abnormality of gait 08/27/2014   Memory difficulty 08/27/2014   Atrial fibrillation ([place]) 09/08/2013   Cholelithiases 07/19/2013   Cholelithiasis 07/19/2013   Encounter for therapeutic drug monitoring 07/03/2013   Aortic valve replaced 04/05/2013   Parkinson disease ([place]) 09/27/2012   Orthostatic hypotension 04/01/2012   Pacemaker-Medtronic 03/29/2012  HEPATITIS A 06/04/2010   HYPERTHYROIDISM 06/04/2010    HYPERLIPIDEMIA 06/04/2010   Essential hypertension 06/04/2010   CAD 06/04/2010   AORTIC STENOSIS 06/04/2010   SICK SINUS SYNDROME 06/04/2010   HIATAL HERNIA 06/04/2010   SHORTNESS OF BREATH 06/04/2010   CHEST PAIN 06/04/2010    Arliss Journey, PT, DPT  05/05/2021, 2:40 PM  [facility] [address] [place], [place], [address] Phone: [phone]   Fax:  [phone]  Name: RYSZARD SOCARRAS MRN: [id number] Date of Birth: 02-10-1941

## 2021-05-05 NOTE — Therapy (Signed)
[facility] [address] [place], [place], [address] Phone: [phone]   Fax:  [phone]  Speech Language Pathology Treatment  Patient Details  Name: Donald [name] MRN: [id number] Date of Birth: 04/25/1941 Referring Provider (SLP): Dr. Wells Guiles Tat   Encounter Date: 05/05/2021   End of Session - 05/05/21 1005     Visit Number 12    Number of Visits 25    Date for SLP Re-Evaluation 05/23/21    SLP Start Time 37    SLP Stop Time  1055    SLP Time Calculation (min) 40 min    Activity Tolerance Patient tolerated treatment well             Past Medical History:  Diagnosis Date   Abnormality of gait 08/27/2014   Anxiety    Aortic stenosis    s/p AVR by Dr Cyndia Bent   Arthritis    left wrist- probable- not diagonised   Asthma    in the past   CAD (coronary artery disease)    s/p CABG 2006   GERD (gastroesophageal reflux disease)    H/O seasonal allergies    H/O: rheumatic fever    Hearing deficit    Bilateral hearing aids   Hematuria    had CT scan   Hiatal hernia    History of kidney stones    HOH (hard of hearing)    Hyperlipidemia    Hypertension    Hyperthyroidism    Inguinal hernia    right side; watching at this time per wife's report   Memory difficulty 08/27/2014   Pacemaker    Parkinson's disease    Persistent atrial fibrillation ([place])    Scarlet fever    as a child   Shortness of breath    Sick sinus syndrome ([place])    s/p PPM (MDT) 2006    Past Surgical History:  Procedure Laterality Date   AORTIC VALVE REPLACEMENT  2006   CARDIOVERSION N/A 09/14/2013   Procedure: CARDIOVERSION;  Surgeon: Sanda Klein, MD;  Location: [facility];  Service: Cardiovascular;  Laterality: N/A;   CHOLECYSTECTOMY N/A 07/21/2013   Procedure: LAPAROSCOPIC CHOLECYSTECTOMY;  Surgeon: Edward Jolly, MD;  Location: [facility];  Service: General;  Laterality: N/A;   COLONOSCOPY W/ POLYPECTOMY      COLONOSCOPY WITH PROPOFOL N/A 08/31/2016   Procedure: COLONOSCOPY WITH PROPOFOL;  Surgeon: Wilford Corner, MD;  Location: [facility] ENDOSCOPY;  Service: Endoscopy;  Laterality: N/A;   CORONARY ARTERY BYPASS GRAFT  2006   DENTAL SURGERY     implants   INSERT / REPLACE / REMOVE PACEMAKER  2006, 2012   MDT for sick sinus syndrome, gen change 8/12 by [place]    There were no vitals filed for this visit.   Subjective Assessment - 05/05/21 1245     Subjective "I'm Donald [name]"    Patient is accompained by: Family member    Currently in Pain? No/denies                   ADULT SLP TREATMENT - 05/05/21 1005       General Information   Behavior/Cognition Alert;Cooperative;Requires cueing;Hard of hearing      Treatment Provided   Treatment provided Cognitive-Linquistic      Cognitive-Linquistic Treatment   Treatment focused on Dysarthria;Patient/family/caregiver education    Skilled Treatment SLP session focused on dysarthria. Loud /a/ conducted with average of 90 dB with SLP cues and modeling  provided to reduce tension and strain. Pt's wife reported he has been completing HEP for reading aloud, but often reads too quickly resulting in rushes of speech and low speech volume. SLP targeted coordinating abdominal breathing and slow rate for reading aloud. Usual mod A fading to occasional mod A required to optimize breath support while reading. SLP used visual tracker to slow rate, which was effective. Reading averaged WNL for volume. In subsequent conversation, volume averaged WNL with SLP providing intermittent visual cues to slow rate of speech to increase listener comprehension, which was effective.      Assessment / Recommendations / Plan   Plan Continue with current plan of care      Progression Toward Goals   Progression toward goals Progressing toward goals              SLP Education - 05/05/21 1250     Education Details visual tracker for reading, cues  to slow rate in conversation    Person(s) Educated Patient;Spouse    Methods Demonstration;Explanation;Verbal cues    Comprehension Verbalized understanding;Returned demonstration;Verbal cues required;Need further instruction              SLP Short Term Goals - 05/05/21 1007       SLP SHORT TERM GOAL #1   Title Pt will average 85dB on loud /a/ with occasional min A over 2 sessions.    Baseline 04/16/21, 04/21/21    Status Achieved      SLP SHORT TERM GOAL #2   Title Pt will average 68dB on 18/20 sentences with occasional min A    Baseline 04/16/21    Status Achieved      SLP SHORT TERM GOAL #3   Title Pt/spouse will follow 2 diet modificiations with occasional min A over 3 sessions    Baseline 04/14/21    Time 1    Period Weeks    Status On-going      SLP SHORT TERM GOAL #4   Title Pt will complete HEP for dysphagia with usual min  visual and verbal cues over 2 sessions    Status Achieved      SLP SHORT TERM GOAL #5   Title Pt will average 68dB over 5 minute conversation with occasional min A over 2 sessions    Baseline 04/14/21, 04/21/21    Status Achieved              SLP Long Term Goals - 05/05/21 1007       SLP LONG TERM GOAL #1   Title Pt will average 85dB on loud /a/ with rare min A over 2 sessions    Baseline 04/16/21; 04/28/21    Status Achieved      SLP LONG TERM GOAL #2   Title Pt will average 68dB over 12 minute conversation with occasional min A    Baseline 05-05-21    Time 7    Period Weeks    Status On-going      SLP LONG TERM GOAL #3   Title Pt will complete HEP for dysarthria and dysphagia with occasional min A over 3 sessions    Time 7    Period Weeks    Status On-going      SLP LONG TERM GOAL #4   Title Pt will follow swallow precautions with occasional min A over 3 sessions    Baseline 04/14/21;    Time 7    Period Weeks    Status On-going      SLP  LONG TERM GOAL #5   Title Pt/spouse will verbalize 4 s/s of aspiration pna  with rare min A    Time 7    Period Weeks    Status On-going              Plan - 05/05/21 1006     Clinical Impression Statement [name] and [name] report some diffculty communicating due to Donald [name]'s low volume and running his words together. Silvestre requires min A to maintain volume in simple conversation. Intermittent mod A required to slow rate due to rushes in speech. Donald [name] follows swallow precautions with supervision cues in therapy, and inconsistently at home per spouse. He requires cues to swallow after coughing. Ongoing cues to swallow after regular cues to swallow after coughing episodes. Continue skilled ST to maximize intelligilbity and safety fo swallow.    Speech Therapy Frequency 2x / week    Duration 12 weeks    Treatment/Interventions Language facilitation;Environmental controls;Cueing hierarchy;SLP instruction and feedback;Pharyngeal strengthening exercises;Compensatory techniques;Cognitive reorganization;Functional tasks;Compensatory strategies;Patient/family education;Multimodal communcation approach;Internal/external aids;Trials of upgraded texture/liquids;Diet toleration management by SLP;Other (comment)    Potential to Achieve Goals Fair    Potential Considerations Medical prognosis;Severity of impairments;Previous level of function             Patient will benefit from skilled therapeutic intervention in order to improve the following deficits and impairments:   Dysarthria and anarthria    Problem List Patient Active Problem List   Diagnosis Date Noted   HOH (hard of hearing) 12/18/2020   Personal history of colonic polyps 08/31/2016   Abnormality of gait 08/27/2014   Memory difficulty 08/27/2014   Atrial fibrillation ([place]) 09/08/2013   Cholelithiases 07/19/2013   Cholelithiasis 07/19/2013   Encounter for therapeutic drug monitoring 07/03/2013   Aortic valve replaced 04/05/2013   Parkinson disease ([place]) 09/27/2012   Orthostatic hypotension  04/01/2012   Pacemaker-Medtronic 03/29/2012   HEPATITIS A 06/04/2010   HYPERTHYROIDISM 06/04/2010   HYPERLIPIDEMIA 06/04/2010   Essential hypertension 06/04/2010   CAD 06/04/2010   AORTIC STENOSIS 06/04/2010   SICK SINUS SYNDROME 06/04/2010   HIATAL HERNIA 06/04/2010   SHORTNESS OF BREATH 06/04/2010   CHEST PAIN 06/04/2010    Alinda Deem, CCC-SLP 05/05/2021, 12:54 PM  [facility] [address] [place], [place], [address] Phone: [phone]   Fax:  [phone]   Name: SHEY BASSINGER MRN: [id number] Date of Birth: July 24, 1940

## 2021-05-05 NOTE — Therapy (Signed)
[facility] [address] [place], [place], [address] Phone: [phone]   Fax:  [phone]  Occupational Therapy Treatment  Patient Details  Name: Donald [name] MRN: [id number] Date of Birth: 1941-04-23 Referring Provider (OT): Dr. Lurena Joiner Tat   Encounter Date: 05/05/2021   OT End of Session - 05/05/21 0937     Visit Number 15    Number of Visits 21    Date for OT Re-Evaluation 05/28/21    Authorization Type [employer], follow Medicare guidelines, copay per day    Authorization - Visit Number 15    Authorization - Number of Visits 20    Progress Note Due on Visit 20    OT Start Time 0850    OT Stop Time 0930    OT Time Calculation (min) 40 min    Activity Tolerance Patient tolerated treatment well    Behavior During Therapy [facility] for tasks assessed/performed;Impulsive             Past Medical History:  Diagnosis Date   Abnormality of gait 08/27/2014   Anxiety    Aortic stenosis    s/p AVR by Dr Laneta Simmers   Arthritis    left wrist- probable- not diagonised   Asthma    in the past   CAD (coronary artery disease)    s/p CABG 2006   GERD (gastroesophageal reflux disease)    H/O seasonal allergies    H/O: rheumatic fever    Hearing deficit    Bilateral hearing aids   Hematuria    had CT scan   Hiatal hernia    History of kidney stones    HOH (hard of hearing)    Hyperlipidemia    Hypertension    Hyperthyroidism    Inguinal hernia    right side; watching at this time per wife's report   Memory difficulty 08/27/2014   Pacemaker    Parkinson's disease    Persistent atrial fibrillation ([facility])    Scarlet fever    as a child   Shortness of breath    Sick sinus syndrome ([facility])    s/p PPM (MDT) 2006    Past Surgical History:  Procedure Laterality Date   AORTIC VALVE REPLACEMENT  2006   CARDIOVERSION N/A 09/14/2013   Procedure: CARDIOVERSION;  Surgeon: Thurmon Fair, MD;  Location: [facility]  ENDOSCOPY;  Service: Cardiovascular;  Laterality: N/A;   CHOLECYSTECTOMY N/A 07/21/2013   Procedure: LAPAROSCOPIC CHOLECYSTECTOMY;  Surgeon: Mariella Saa, MD;  Location: [facility] OR;  Service: General;  Laterality: N/A;   COLONOSCOPY W/ POLYPECTOMY     COLONOSCOPY WITH PROPOFOL N/A 08/31/2016   Procedure: COLONOSCOPY WITH PROPOFOL;  Surgeon: Charlott Rakes, MD;  Location: [facility] ENDOSCOPY;  Service: Endoscopy;  Laterality: N/A;   CORONARY ARTERY BYPASS GRAFT  2006   DENTAL SURGERY     implants   INSERT / REPLACE / REMOVE PACEMAKER  2006, 2012   MDT for sick sinus syndrome, gen change 8/12 by JA   KIDNEY STONE SURGERY  1984    There were no vitals filed for this visit.   Subjective Assessment - 05/05/21 0853     Subjective  Pt reports that he does his exercises    Pertinent History Parkinson's Disease.   PMH:  hx of falls (wears helment), arthritis, asthma, CAD, GERD, HOH, HLD, HTN, hyperthyroidism, anxiety, aortic stenosis, hx of aortic valve replacement 2006, memory difficulty, pacemaker, persistent a-fib, hx of scarlet fever, shortness of breath, hx of CABG 2006, cholecystectomy  2015, neuropathy    Limitations fall risk (wears helment and pads), freezing, hard of hearing--reads lips (**wear clear mask)    Patient Stated Goals improve coordination    Currently in Pain? No/denies             Picking up and placing checkers in connect 4 slots with each hand incorporating midrange reach for incr elbow ext with min cueing for large amplitude movements (elbow ext/PWR! Hands)--pt demo decr hypokinesia  [place] Manipulation Task (untimed) for coordination with each hand with min cueing for timing, shoulder compensation, and use of PWR! Hands--improved coordination noted with decr shoulder compensation/improved positioning.  Stacking/unstacking 1-inch blocks with each hand (10 block towers) with min difficulty/cueing for timing and use of PWR! Hands, then with both hands simultaneously for  bilateral hand coordination and timing (7 blocks).    Functional reaching in standing with RW with feet staggered and apart (diagonal) with min-mod cues/visual targets with each UE with focus on wt. Shift and timing.  Then sequencing step forward/back then reach with each UE/LE with min-mod cueing for wt. Shift, timing, and sequence with CGA.  Functional ambulation with RW and sit>stand, stand>sit with min cueing for large amplitude movements.       OT Education - 05/05/21 [id number]     Education Details Reviewed importance of HEP, looking at hands for functional tasks/using vision for functional tasks, slowing down/timing, instructed pt/wife in performing coordination HEP with BUEs simultaneously as appropriate/as able to improve bilateral coordination and timing, importance of keeping feet apart/staggered and strategy of using markings for visual targets for exercises/difficult areas in the home, indications for sooner return to therapy    Person(s) Educated Patient;Spouse    Methods Explanation;Demonstration    Comprehension Verbalized understanding              OT Short Term Goals - 04/28/21 1449       OT SHORT TERM GOAL #1   Title Pt will perform PD-specific HEP with cueing prn from caregiver--check STGs 03/27/21    Time 4    Period Weeks    Status Achieved      OT SHORT TERM GOAL #2   Title Pt will improve R hand coordination for ADLs (buttoning/tying shoes) as shown by completing 9-hole peg test in 75sec or less.    Baseline 88.31sec    Time 4    Period Weeks    Status On-going   04/23/21 1 min 42 secs     OT SHORT TERM GOAL #3   Title Pt will improve L hand coordination for ADLs (buttoning/tying shoes) as shown by completing 9-hole peg test in 90 sec or less.    Baseline 108.94sec    Time 4    Period Weeks    Status On-going   04/23/21:> 2 mins     OT SHORT TERM GOAL #4   Title Pt/caregiver will verbalize understanding of ways to decr risk of future complcations  related to PD (including falls).    Time 4    Period Weeks    Status Achieved               OT Long Term Goals - 04/28/21 1449       OT LONG TERM GOAL #1   Title Pt/caregiver will verbalize understanding of AE/strategies to incr ease, safety, and independence with ADLs/IADLs.--check LTGs 04/26/21    Status On-going      OT LONG TERM GOAL #2   Title Pt will improve R hand  coordination for ADLs (buttoning/tying shoes) as shown by completing 9-hole peg test in 65sec or less.    Status On-going      OT LONG TERM GOAL #3   Title Pt will improve L hand coordination for ADLs (buttoning/tying shoes) as shown by completing 9-hole peg test in 75 sec or less.    Status On-going   04/23/21 >2 mins     OT LONG TERM GOAL #4   Title Pt will improve functional reaching/coordination for ADLs as shown by improving score on box and blocks test by at least 5 with dominant LUE.    Status On-going   04/23/21-35 blocks, 3 block increase     OT LONG TERM GOAL #5   Title Pt/caregiver will verbalize understanding of appropriate community resources.    Status Achieved                   Plan - 05/05/21 0939     Clinical Impression Statement Pt demo improving coordination and decr hypokinesia, improved visual tracking to hand movements, and shoulder compensation for coordination tasks.  Pt continues to need cueing to slow down intermittently.    OT Occupational Profile and History Detailed Assessment- Review of Records and additional review of physical, cognitive, psychosocial history related to current functional performance    Occupational performance deficits (Please refer to evaluation for details): ADL's;IADL's;Leisure    Body Structure / Function / Physical Skills Balance;ADL;Decreased knowledge of use of DME;UE functional use;IADL;Improper spinal/pelvic alignment;Vision;Mobility;Coordination;Decreased knowledge of precautions;[facility]    Rehab Potential Good    Clinical Decision Making  Several treatment options, min-mod task modification necessary    Comorbidities Affecting Occupational Performance: May have comorbidities impacting occupational performance    Modification or Assistance to Complete Evaluation  Min-Moderate modification of tasks or assist with assess necessary to complete eval    OT Frequency 2x / week    OT Duration 4 weeks    OT Treatment/Interventions Self-care/ADL training;Moist Heat;DME and/or AE instruction;Therapeutic activities;Therapeutic exercise;Cognitive remediation/compensation;Visual/perceptual remediation/compensation;Passive range of motion;Functional Mobility Training;Neuromuscular education;Cryotherapy;Energy conservation;Manual Therapy;Patient/family education    Plan anticipate d/c next session, complete pt/caregiver education, check remaining goals    Consulted and Agree with Plan of Care Patient;Family member/caregiver    Family Member Consulted wife             Patient will benefit from skilled therapeutic intervention in order to improve the following deficits and impairments:   Body Structure / Function / Physical Skills: Balance, ADL, Decreased knowledge of use of DME, UE functional use, IADL, Improper spinal/pelvic alignment, Vision, Mobility, Coordination, Decreased knowledge of precautions, [place]       Visit Diagnosis: Other symptoms and signs involving the nervous system  Unsteadiness on feet  Other abnormalities of gait and mobility  Abnormal posture  Other lack of coordination  Visuospatial deficit  Tremor    Problem List Patient Active Problem List   Diagnosis Date Noted   HOH (hard of hearing) 12/18/2020   Personal history of colonic polyps 08/31/2016   Abnormality of gait 08/27/2014   Memory difficulty 08/27/2014   Atrial fibrillation ([place]) 09/08/2013   Cholelithiases 07/19/2013   Cholelithiasis 07/19/2013   Encounter for therapeutic drug monitoring 07/03/2013   Aortic valve replaced 04/05/2013    Parkinson disease ([place]) 09/27/2012   Orthostatic hypotension 04/01/2012   Pacemaker-Medtronic 03/29/2012   HEPATITIS A 06/04/2010   HYPERTHYROIDISM 06/04/2010   HYPERLIPIDEMIA 06/04/2010   Essential hypertension 06/04/2010   CAD 06/04/2010   AORTIC STENOSIS 06/04/2010   SICK  SINUS SYNDROME 06/04/2010   HIATAL HERNIA 06/04/2010   SHORTNESS OF BREATH 06/04/2010   CHEST PAIN 06/04/2010    Vianne Bulls, OT 05/05/2021, 9:45 AM  [facility] [address] [place], [place], [address] Phone: [phone]   Fax:  [phone]  Name: Donald [name] MRN: [id number] Date of Birth: [birth year]  Vianne Bulls, OTR/L [facility] [address] [place], [place]  [address] [phone] phone [phone] 05/05/21 9:46 AM

## 2021-05-07 ENCOUNTER — Encounter: Payer: Self-pay | Admitting: Speech Pathology

## 2021-05-07 ENCOUNTER — Encounter: Payer: Self-pay | Admitting: Physical Therapy

## 2021-05-07 ENCOUNTER — Ambulatory Visit: Payer: PPO | Admitting: Physical Therapy

## 2021-05-07 ENCOUNTER — Ambulatory Visit: Payer: PPO | Admitting: Speech Pathology

## 2021-05-07 ENCOUNTER — Ambulatory Visit: Payer: PPO | Admitting: Occupational Therapy

## 2021-05-07 ENCOUNTER — Other Ambulatory Visit: Payer: Self-pay

## 2021-05-07 DIAGNOSIS — R29818 Other symptoms and signs involving the nervous system: Secondary | ICD-10-CM

## 2021-05-07 DIAGNOSIS — R293 Abnormal posture: Secondary | ICD-10-CM

## 2021-05-07 DIAGNOSIS — R41842 Visuospatial deficit: Secondary | ICD-10-CM

## 2021-05-07 DIAGNOSIS — R278 Other lack of coordination: Secondary | ICD-10-CM

## 2021-05-07 DIAGNOSIS — R2689 Other abnormalities of gait and mobility: Secondary | ICD-10-CM

## 2021-05-07 DIAGNOSIS — R471 Dysarthria and anarthria: Secondary | ICD-10-CM | POA: Diagnosis not present

## 2021-05-07 DIAGNOSIS — R1312 Dysphagia, oropharyngeal phase: Secondary | ICD-10-CM

## 2021-05-07 DIAGNOSIS — R2681 Unsteadiness on feet: Secondary | ICD-10-CM

## 2021-05-07 DIAGNOSIS — M6281 Muscle weakness (generalized): Secondary | ICD-10-CM

## 2021-05-07 NOTE — Patient Instructions (Signed)
Access Code: [email or web address] URL: [email or web address] Date: 05/07/2021 Prepared by: Sherlie Ban  Program Notes Sit to Stand Transfers:   1. Scoot out to the edge of the chair 2. Place your feet flat on the floor, shoulder width apart.  Make sure your feet are tucked just under your knees. 3. Lean forward (nose over toes) with momentum, and stand up tall with your best posture.  If you need to use your arms, use them as a quick boost up to stand. 4. If you are in a low or soft chair, you can lean back and then forward up to stand, in order to get more momentum. 5. Once you are standing, make sure you are looking ahead and standing tall.   To sit down:   1. Back up until you feel the chair behind your legs. 2. Bend at you hips, reaching  Back for you chair, if needed, then slowly squat to sit down on your chair.   Exercises Seated Active Hip Flexion - 2 x daily - 5 x weekly - 1-2 sets - 10 reps Side to side weightshift - 2 x daily - 7 x weekly - 1 sets - 10 reps Side Stepping with Counter Support - 2 x daily - 5 x weekly - 2 sets - 10 reps Alternating Step Backward with Support - 2 x daily - 5 x weekly - 2 sets - 10 reps Standing March with Counter Support - 1 x daily - 5 x weekly - 2 sets - 10 reps Sit to Stand with Armchair - 1 x daily - 5 x weekly - 2 sets - 10 reps

## 2021-05-07 NOTE — Therapy (Addendum)
[facility] [address] [place], [place], [address] Phone: [phone]   Fax:  [phone]  Physical Therapy Treatment/Discharge Summary  Patient Details  Name: Donald [name] MRN: [id number] Date of Birth: 1940/12/15 Referring Provider (PT): Dr. Carles Collet   Encounter Date: 05/07/2021   PT End of Session - 05/07/21 1227     Visit Number 21    Number of Visits 33    Date for PT Re-Evaluation 07/22/21    Authorization Type HTA - $30 co pay    PT Start Time 0930    PT Stop Time 1014    PT Time Calculation (min) 44 min    Equipment Utilized During Treatment Gait belt    Activity Tolerance Patient tolerated treatment well    Behavior During Therapy [facility] for tasks assessed/performed;Impulsive             Past Medical History:  Diagnosis Date   Abnormality of gait 08/27/2014   Anxiety    Aortic stenosis    s/p AVR by Dr Cyndia Bent   Arthritis    left wrist- probable- not diagonised   Asthma    in the past   CAD (coronary artery disease)    s/p CABG 2006   GERD (gastroesophageal reflux disease)    H/O seasonal allergies    H/O: rheumatic fever    Hearing deficit    Bilateral hearing aids   Hematuria    had CT scan   Hiatal hernia    History of kidney stones    HOH (hard of hearing)    Hyperlipidemia    Hypertension    Hyperthyroidism    Inguinal hernia    right side; watching at this time per wife's report   Memory difficulty 08/27/2014   Pacemaker    Parkinson's disease    Persistent atrial fibrillation ([place])    Scarlet fever    as a child   Shortness of breath    Sick sinus syndrome ([place])    s/p PPM (MDT) 2006    Past Surgical History:  Procedure Laterality Date   AORTIC VALVE REPLACEMENT  2006   CARDIOVERSION N/A 09/14/2013   Procedure: CARDIOVERSION;  Surgeon: Sanda Klein, MD;  Location: [place] [facility] ENDOSCOPY;  Service: Cardiovascular;  Laterality: N/A;   CHOLECYSTECTOMY N/A 07/21/2013   Procedure:  LAPAROSCOPIC CHOLECYSTECTOMY;  Surgeon: Edward Jolly, MD;  Location: [facility];  Service: General;  Laterality: N/A;   COLONOSCOPY W/ POLYPECTOMY     COLONOSCOPY WITH PROPOFOL N/A 08/31/2016   Procedure: COLONOSCOPY WITH PROPOFOL;  Surgeon: Wilford Corner, MD;  Location: [facility] ENDOSCOPY;  Service: Endoscopy;  Laterality: N/A;   CORONARY ARTERY BYPASS GRAFT  2006   DENTAL SURGERY     implants   INSERT / REPLACE / REMOVE PACEMAKER  2006, 2012   MDT for sick sinus syndrome, gen change 8/12 by [place]    There were no vitals filed for this visit.   Subjective Assessment - 05/07/21 0932     Subjective No changes, no falls.    Pertinent History PMH: PD, CAD (s/p CABG 2006), LD, HTN, a fib, pacemaker, GERD, on anticoagulants    Limitations Walking;House hold activities;Standing    Patient Stated Goals wants to improve his balance    Currently in Pain? No/denies  [facility] Adult PT Treatment/Exercise - 05/07/21 1010       Transfers   Transfers Sit to Stand;Stand to Sit    Sit to Stand 5: Supervision    Stand to Sit 5: Supervision    Comments Throughout session with cues for proper scooting, forward lean, wide BOS and tucking feet under. Cues to take time with his transfers to set himself up for better set up in order to decr retropulsion.      Ambulation/Gait   Ambulation/Gait Yes    Ambulation/Gait Assistance 4: Min guard    Ambulation/Gait Assistance Details Used red tband at bottom of RW as visual cue (pt's wife may be ordering laser to attach to pt's RW at home). Reviewed cues for gait with pt and pt's spouse - giving verbal/visual cues for incr stride length during gait - "big steps, big kicks to the red". Reviewed techniques to help with freezing/festination episodes, cues to STOP, reset/take his time with tall posture, and take a big step forwards. Reiterated importance of with festination episodes to stop and  reset and take a few moments to take some deep breaths to help with these stressful episodes.    Ambulation Distance (Feet) 345 Feet    Assistive device Rolling walker    Gait Pattern Step-through pattern;Decreased step length - right;Decreased step length - left    Ambulation Surface Level;Indoor    Gait Comments Reviewed with pt/pt's spouse techniques on going through dooways to help with festination/freezing- reviewed on getting to doorway, stopping and resetting with tall posture and using a visual target ahead to focus on taking big steps to to help with festination/shuffling episodes through door ways. Performed x6 reps total with pt's spouse able to perform giving pt proper cues for home.      Neuro Re-ed    Neuro Re-ed Details  Standing at RW: x10 reps heel raises, SLS taps x10 reps alternating to 6" step - cues for slowed and controlled. All performed with BUE support.                 Access Code: [email or web address] URL: [email or web address] Date: 05/07/2021 Prepared by: Janann August  Reviewed entirety of pt's HEP and gave new handout. See MedBridge for more details. Discussed importance of pt's spouse performing with him for supervision and to give him proper cues for technique.   Program Notes Sit to Stand Transfers:   1. Scoot out to the edge of the chair 2. Place your feet flat on the floor, shoulder width apart.  Make sure your feet are tucked just under your knees. 3. Lean forward (nose over toes) with momentum, and stand up tall with your best posture.  If you need to use your arms, use them as a quick boost up to stand. 4. If you are in a low or soft chair, you can lean back and then forward up to stand, in order to get more momentum. 5. Once you are standing, make sure you are looking ahead and standing tall.   To sit down:   1. Back up until you feel the chair behind your legs. 2. Bend at you hips, reaching  Back for you chair, if needed, then slowly  squat to sit down on your chair.   Exercises Seated Active Hip Flexion - 2 x daily - 5 x weekly - 1-2 sets - 10 reps - performed as seated PWR Up  Side to side weightshift - 2 x daily - 7 x weekly - 1 sets -  10 reps Side Stepping with Counter Support - 2 x daily - 5 x weekly - 2 sets - 10 reps Alternating Step Backward with Support - 2 x daily - 5 x weekly - 2 sets - 10 reps Standing March with Counter Support - 1 x daily - 5 x weekly - 2 sets - 10 reps - performed at RW  Sit to Stand with Armchair - 1 x daily - 5 x weekly - 2 sets - 10 reps   PHYSICAL THERAPY DISCHARGE SUMMARY  Visits from Start of Care: 21  Current functional level related to goals / functional outcomes: See LTGs/clinical assessment statement.   Remaining deficits: Decr timing/coordination of gait, impaired balance, impulsivity, cognitive/hearing deficits, decr functional mobility.    Education / Equipment: HEP, techniques/strategies to help with gait quality and freezing/festination episodes, sit <> stand transfer technique, fall prevention.   Patient agrees to discharge. Patient goals were partially met. Patient is being discharged due to pt and pt's spouse ready for a break from therapies and given tools/education to use at home. Will schedule for return PD evals in 6 months.      PT Short Term Goals - 04/23/21 1155       PT SHORT TERM GOAL #1   Title ALL STGS = LTGS               PT Long Term Goals - 05/07/21 1436       PT LONG TERM GOAL #1   Title Pt will perform final HEP with supervision from pt's spouse for strength, balance, and transfers in order to build upon functional gains made in therapy. ALL LTGS DUE 05/21/21    Baseline reviewed on 05/07/21    Time 4    Period Weeks    Status Achieved    Target Date 05/21/21      PT LONG TERM GOAL #2   Title Pt will ambulate 42' with RW and supervision over level indoor surfaces with use of laser as visual cue and pt's spouse giving cues to pt  for improved household and short community distances.    Baseline 345' with RW with min guard, used red tband as visual cue    Time 4    Period Weeks    Status Partially Met      PT LONG TERM GOAL #3   Title Pt will be able to perform 10 sit <> stands with BUE support with proper technique and no episodes of retropulson in order to demo improved balance/functional transfers.    Baseline met on 05/05/21    Time 4    Period Weeks    Status Achieved      PT LONG TERM GOAL #4   Title Pt and pt's spouse will verbalize understanding/cues for strategies to assist with freezing/festination episodes at home.    Baseline met today, pt will continue to need verbal/demo reminder cues from spouse at home during these episodes.    Time 4    Period Weeks    Status Partially Met                   Plan - 05/07/21 1437     Clinical Impression Statement Today's skilled session focused on reviewing final HEP for therapy for pt to perform with wife supervision at home for safety and pt will need cues for proper completion of exercises. Reviewed cues/strategies with gait training for larger stride lengths, strategies for freezing/festination episodes, going through doorways, and sit <>  stand transfers. Pt's spouse able to verbalize and demo understanding, as pt's spouse will continue to need to cue pt at home. pt and pt's spouse ready to take a break from therapy at this time and will be discharged -will schedule PD evals in 6 months. LTGs partially met due to severity of pt's deficits.    Personal Factors and Comorbidities Comorbidity 3+;Time since onset of injury/illness/exacerbation;Past/Current Experience;Behavior Pattern    Comorbidities PD (diagnosed 2012), CAD (s/p CABG 2006), LD, HTN, a fib, pacemaker, GERD, on anticoagulants    Examination-Activity Limitations Bed Mobility;Caring for Others;Toileting;Transfers;Stairs;Locomotion Level;Stand;Squat;Reach Overhead    Psychologist, forensic;Yard Work    Merchant [employer] officer Evolving/Moderate complexity    Rehab Potential Good    PT Frequency 2x / week    PT Duration 8 weeks    PT Treatment/Interventions ADLs/Self Care Home Management;DME Instruction;Therapeutic activities;Functional mobility training;Gait training;Stair training;Therapeutic exercise;Balance training;Neuromuscular re-education;Patient/family education;Energy conservation;Vestibular    PT Next Visit Plan D/C    PT Home Exercise Plan [email or web address]    Consulted and Agree with Plan of Care Patient;Family member/caregiver    Family Member Consulted pt's [name]             Patient will benefit from skilled therapeutic intervention in order to improve the following deficits and impairments:  Abnormal gait, Decreased activity tolerance, Decreased coordination, Decreased balance, Decreased endurance, Decreased knowledge of use of DME, Decreased safety awareness, Decreased mobility, Decreased strength, Difficulty walking, Impaired flexibility, Postural dysfunction  Visit Diagnosis: Other symptoms and signs involving the nervous system  Other abnormalities of gait and mobility  Abnormal posture  Unsteadiness on feet     Problem List Patient Active Problem List   Diagnosis Date Noted   HOH (hard of hearing) 12/18/2020   Personal history of colonic polyps 08/31/2016   Abnormality of gait 08/27/2014   Memory difficulty 08/27/2014   Atrial fibrillation ([place]) 09/08/2013   Cholelithiases 07/19/2013   Cholelithiasis 07/19/2013   Encounter for therapeutic drug monitoring 07/03/2013   Aortic valve replaced 04/05/2013   Parkinson disease ([place]) 09/27/2012   Orthostatic hypotension 04/01/2012   Pacemaker-Medtronic 03/29/2012   HEPATITIS A 06/04/2010   HYPERTHYROIDISM 06/04/2010   HYPERLIPIDEMIA 06/04/2010   Essential hypertension 06/04/2010   CAD 06/04/2010   AORTIC STENOSIS 06/04/2010    SICK SINUS SYNDROME 06/04/2010   HIATAL HERNIA 06/04/2010   SHORTNESS OF BREATH 06/04/2010   CHEST PAIN 06/04/2010    Arliss Journey, PT, DPT 05/07/2021, 2:40 PM  [facility] [address] [place], [place], [address] Phone: [phone]   Fax:  [phone]  Name: Donald [name] MRN: [id number] Date of Birth: [birth year]

## 2021-05-07 NOTE — Therapy (Signed)
[facility] [address] [place], [place], [address] Phone: [phone]   Fax:  [phone]  Occupational Therapy Treatment  Patient Details  Name: Donald [name] MRN: [id number] Date of Birth: 27-Mar-1941 Referring Provider (OT): Dr. Wells Guiles Tat   Encounter Date: 05/07/2021   OT End of Session - 05/07/21 1048     Visit Number 16    Number of Visits 21    Date for OT Re-Evaluation 05/28/21    Authorization Type [employer], follow Medicare guidelines, copay per day    Authorization - Visit Number 16    Authorization - Number of Visits 20    Progress Note Due on Visit 20    OT Start Time 0848    OT Stop Time 0930    OT Time Calculation (min) 42 min    Activity Tolerance Patient tolerated treatment well    Behavior During Therapy [facility] for tasks assessed/performed;Impulsive             Past Medical History:  Diagnosis Date   Abnormality of gait 08/27/2014   Anxiety    Aortic stenosis    s/p AVR by Dr Cyndia Bent   Arthritis    left wrist- probable- not diagonised   Asthma    in the past   CAD (coronary artery disease)    s/p CABG 2006   GERD (gastroesophageal reflux disease)    H/O seasonal allergies    H/O: rheumatic fever    Hearing deficit    Bilateral hearing aids   Hematuria    had CT scan   Hiatal hernia    History of kidney stones    HOH (hard of hearing)    Hyperlipidemia    Hypertension    Hyperthyroidism    Inguinal hernia    right side; watching at this time per wife's report   Memory difficulty 08/27/2014   Pacemaker    Parkinson's disease    Persistent atrial fibrillation ([place])    Scarlet fever    as a child   Shortness of breath    Sick sinus syndrome ([place])    s/p PPM (MDT) 2006    Past Surgical History:  Procedure Laterality Date   AORTIC VALVE REPLACEMENT  2006   CARDIOVERSION N/A 09/14/2013   Procedure: CARDIOVERSION;  Surgeon: Sanda Klein, MD;  Location: [facility]  ENDOSCOPY;  Service: Cardiovascular;  Laterality: N/A;   CHOLECYSTECTOMY N/A 07/21/2013   Procedure: LAPAROSCOPIC CHOLECYSTECTOMY;  Surgeon: Edward Jolly, MD;  Location: [facility];  Service: General;  Laterality: N/A;   COLONOSCOPY W/ POLYPECTOMY     COLONOSCOPY WITH PROPOFOL N/A 08/31/2016   Procedure: COLONOSCOPY WITH PROPOFOL;  Surgeon: Wilford Corner, MD;  Location[facility] ENDOSCOPY;  Service: Endoscopy;  Laterality: N/A;   CORONARY ARTERY BYPASS GRAFT  2006   DENTAL SURGERY     implants   INSERT / REPLACE / REMOVE PACEMAKER  2006, 2012   MDT for sick sinus syndrome, gen change 8/12 by [place]    There were no vitals filed for this visit.   Subjective Assessment - 05/07/21 0853     Subjective  Denies pain    Pertinent History Parkinson's Disease.   PMH:  hx of falls (wears helment), arthritis, asthma, CAD, GERD, HOH, HLD, HTN, hyperthyroidism, anxiety, aortic stenosis, hx of aortic valve replacement 2006, memory difficulty, pacemaker, persistent a-fib, hx of scarlet fever, shortness of breath, hx of CABG 2006, cholecystectomy 2015, neuropathy  Limitations fall risk (wears helment and pads), freezing, hard of hearing--reads lips (**wear clear mask)    Patient Stated Goals improve coordination    Currently in Pain? No/denies                 Treatment: closed chain shoulder flexion with ball, pt following with his eyes, min verbal and demonstrative cues Modified quadraped PWR! Rock at table, mod facilitation Dynamic reaching with feet staggered to each side, min verbal and tactile cues for posture and amplitude Therapist checked progress towards goals. Pt agrees with d/c.      OCCUPATIONAL THERAPY DISCHARGE SUMMARY    Current functional level related to goals / functional outcomes: Pt made progress but did not fully achieve all goals due to severity of deficits.   Remaining deficits: Decreased coordination, impulsivity, decreased balance,  cognitive deficits, rigidity, decreased functional mobility   Education / Equipment: Pt/ family were educated in [place], community resources, ADL / mobility strategies, preventing future complications. Pt/ family verbalize understanding.   Patient agrees to discharge. Patient goals were partially met. Patient is being discharged due to pt/ family are ready for a break and they demonstrate understanding of education. Recommend return evals in 6 mons due to the progressive nature of PD.               OT Short Term Goals - 05/07/21 0856       OT SHORT TERM GOAL #1   Title Pt will perform PD-specific HEP with cueing prn from caregiver--check STGs 03/27/21    Time 4    Period Weeks    Status Achieved      OT SHORT TERM GOAL #2   Title Pt will improve R hand coordination for ADLs (buttoning/tying shoes) as shown by completing 9-hole peg test in 75sec or less.    Baseline 88.31sec    Time 4    Period Weeks    Status Not Met   12/21-1 min 42 secs     OT SHORT TERM GOAL #3   Title Pt will improve L hand coordination for ADLs (buttoning/tying shoes) as shown by completing 9-hole peg test in 90 sec or less.    Baseline 108.94sec    Time 4    Period Weeks    Status Not Met   04/23/21:> 2 mins, 05/07/21- 6 pegs palaced in 3 mins     OT SHORT TERM GOAL #4   Title Pt/caregiver will verbalize understanding of ways to decr risk of future complcations related to PD (including falls).    Time 4    Period Weeks    Status Achieved               OT Long Term Goals - 05/07/21 1046       OT LONG TERM GOAL #1   Title Pt/caregiver will verbalize understanding of AE/strategies to incr ease, safety, and independence with ADLs/IADLs.--check LTGs 04/26/21    Status Achieved      OT LONG TERM GOAL #2   Title Pt will improve R hand coordination for ADLs (buttoning/tying shoes) as shown by completing 9-hole peg test in 65sec or less.    Status Not Met   1 min 41 secs     OT LONG TERM  GOAL #3   Title Pt will improve L hand coordination for ADLs (buttoning/tying shoes) as shown by completing 9-hole peg test in 75 sec or less.    Status Not Met   04/23/21 >2 mins  OT LONG TERM GOAL #4   Title Pt will improve functional reaching/coordination for ADLs as shown by improving score on box and blocks test by at least 5 with dominant LUE.    Status Not Met   12/21- 29, 30 blocks     OT LONG TERM GOAL #5   Title Pt/caregiver will verbalize understanding of appropriate community resources.    Status Achieved                   Plan - 05/07/21 1048     Clinical Impression Statement Pt demo improving coordination and decr hypokinesia, improved visual tracking to hand movements, and shoulder compensation for coordination tasks.  However he did not fully achieve goals for coordination due to the severity of his deficits.    OT Occupational Profile and History Detailed Assessment- Review of Records and additional review of physical, cognitive, psychosocial history related to current functional performance    Occupational performance deficits (Please refer to evaluation for details): ADL's;IADL's;Leisure    Body Structure / Function / Physical Skills Balance;ADL;Decreased knowledge of use of DME;UE functional use;IADL;Improper spinal/pelvic alignment;Vision;Mobility;Coordination;Decreased knowledge of precautions;FMC    Rehab Potential Good    Clinical Decision Making Several treatment options, min-mod task modification necessary    Comorbidities Affecting Occupational Performance: May have comorbidities impacting occupational performance    Modification or Assistance to Complete Evaluation  Min-Moderate modification of tasks or assist with assess necessary to complete eval    OT Frequency 2x / week    OT Duration 4 weeks    OT Treatment/Interventions Self-care/ADL training;Moist Heat;DME and/or AE instruction;Therapeutic activities;Therapeutic exercise;Cognitive  remediation/compensation;Visual/perceptual remediation/compensation;Passive range of motion;Functional Mobility Training;Neuromuscular education;Cryotherapy;Energy conservation;Manual Therapy;Patient/family education    Plan d/c OT, return evals in 6 mons    Consulted and Agree with Plan of Care Patient;Family member/caregiver    Family Member Consulted wife             Patient will benefit from skilled therapeutic intervention in order to improve the following deficits and impairments:   Body Structure / Function / Physical Skills: Balance, ADL, Decreased knowledge of use of DME, UE functional use, IADL, Improper spinal/pelvic alignment, Vision, Mobility, Coordination, Decreased knowledge of precautions, [place]       Visit Diagnosis: Other symptoms and signs involving the nervous system  Unsteadiness on feet  Other abnormalities of gait and mobility  Abnormal posture  Other lack of coordination  Visuospatial deficit  Muscle weakness (generalized)    Problem List Patient Active Problem List   Diagnosis Date Noted   HOH (hard of hearing) 12/18/2020   Personal history of colonic polyps 08/31/2016   Abnormality of gait 08/27/2014   Memory difficulty 08/27/2014   Atrial fibrillation ([place]) 09/08/2013   Cholelithiases 07/19/2013   Cholelithiasis 07/19/2013   Encounter for therapeutic drug monitoring 07/03/2013   Aortic valve replaced 04/05/2013   Parkinson disease ([place]) 09/27/2012   Orthostatic hypotension 04/01/2012   Pacemaker-Medtronic 03/29/2012   HEPATITIS A 06/04/2010   HYPERTHYROIDISM 06/04/2010   HYPERLIPIDEMIA 06/04/2010   Essential hypertension 06/04/2010   CAD 06/04/2010   AORTIC STENOSIS 06/04/2010   SICK SINUS SYNDROME 06/04/2010   HIATAL HERNIA 06/04/2010   SHORTNESS OF BREATH 06/04/2010   CHEST PAIN 06/04/2010    [place]st [place], OT 05/07/2021, 10:50 AM  [facility] [address] [place], [place], [address] Phone: [phone]   Fax:  [phone]  Name: Donald [name] MRN: [id number] Date of Birth: Jan 26, 1941

## 2021-05-07 NOTE — Therapy (Signed)
[facility] [address] [place], [place], [address] Phone: [phone]   Fax:  [phone]  Speech Language Pathology Treatment & Discharge Summary  Patient Details  Name: Donald [name] MRN: [id number] Date of Birth: [birth year] Referring Provider (SLP): Dr. Wells Guiles Tat   Encounter Date: 05/07/2021   End of Session - 05/07/21 1104     Visit Number 13    Number of Visits 25    Date for SLP Re-Evaluation 05/23/21    SLP Start Time 1015    SLP Stop Time  1100    SLP Time Calculation (min) 45 min    Activity Tolerance Patient tolerated treatment well             Past Medical History:  Diagnosis Date   Abnormality of gait 08/27/2014   Anxiety    Aortic stenosis    s/p AVR by Dr Cyndia Bent   Arthritis    left wrist- probable- not diagonised   Asthma    in the past   CAD (coronary artery disease)    s/p CABG 2006   GERD (gastroesophageal reflux disease)    H/O seasonal allergies    H/O: rheumatic fever    Hearing deficit    Bilateral hearing aids   Hematuria    had CT scan   Hiatal hernia    History of kidney stones    HOH (hard of hearing)    Hyperlipidemia    Hypertension    Hyperthyroidism    Inguinal hernia    right side; watching at this time per wife's report   Memory difficulty 08/27/2014   Pacemaker    Parkinson's disease    Persistent atrial fibrillation ([place])    Scarlet fever    as a child   Shortness of breath    Sick sinus syndrome ([place])    s/p PPM (MDT) 2006    Past Surgical History:  Procedure Laterality Date   AORTIC VALVE REPLACEMENT  2006   CARDIOVERSION N/A 09/14/2013   Procedure: CARDIOVERSION;  Surgeon: Sanda Klein, MD;  Location: [facility] ENDOSCOPY;  Service: Cardiovascular;  Laterality: N/A;   CHOLECYSTECTOMY N/A 07/21/2013   Procedure: LAPAROSCOPIC CHOLECYSTECTOMY;  Surgeon: Edward Jolly, MD;  Location: [facility];  Service: General;  Laterality: N/A;   COLONOSCOPY W/  POLYPECTOMY     COLONOSCOPY WITH PROPOFOL N/A 08/31/2016   Procedure: COLONOSCOPY WITH PROPOFOL;  Surgeon: Wilford Corner, MD;  Location[facility];  Service: Endoscopy;  Laterality: N/A;   CORONARY ARTERY BYPASS GRAFT  2006   DENTAL SURGERY     implants   INSERT / REPLACE / REMOVE PACEMAKER  2006, 2012   MDT for sick sinus syndrome, gen change 8/12 by [place]    There were no vitals filed for this visit.   Subjective Assessment - 05/07/21 1029     Subjective Donald [name] enters with 2 new rubber bracelets - "It's my day"    Currently in Pain? No/denies                   ADULT SLP TREATMENT - 05/07/21 1031       General Information   Behavior/Cognition Alert;Cooperative;Requires cueing;Hard of hearing      Treatment Provided   Treatment provided Cognitive-Linquistic      Dysphagia Treatment   Temperature Spikes Noted No    Respiratory Status Room air    Oral Cavity - Dentition Adequate natural dentition    Treatment  Methods Skilled observation;Therapeutic exercise;Compensation strategy training    Type of PO's observed Thin liquids    Pharyngeal Phase Signs & Symptoms Audible swallow;Immediate cough;Delayed cough    Type of cueing Verbal    Amount of cueing Modified independent   in ST, at home he is using compensations 70% of the time with no reminders     Cognitive-Linquistic Treatment   Treatment focused on Dysarthria;Patient/family/caregiver education    Skilled Treatment Donald [name] completed loud AH with average of 95dB mod I. Article read average 72dB with mod I. Provided a reading highlight card to slow his rate with written cues to slow down and swallow. Wrote on Control and instrumentation engineer "Swallow" in 3 places. In simple structured conversation Donald [name] averaged 72dB with occasional min visual cues      Assessment / Recommendations / Plan   Plan Discharge SLP treatment due to (comment)      Progression Toward Goals   Progression  toward goals Goals met, education completed, patient discharged from [facility] - 05/07/21 1059     Education Details continue speech and swallow HEP 5/7 days    Person(s) Educated Patient;Spouse    Methods Explanation;Verbal cues    Comprehension Verbalized understanding             SPEECH THERAPY DISCHARGE SUMMARY  Visits from Start of Care: 13  Current functional level related to goals / functional outcomes: See goals below   Remaining deficits: Hypokinetic dysarthria, severe oropharyngeal dysphagia   Education / Equipment: Swallow precautions, HEP for dysphagia and dysarthria, compensations for dysarthria, compensations for drool   Patient agrees to discharge. Patient goals were met. Patient is being discharged due to meeting the stated rehab goals.Marland Kitchen      SLP Short Term Goals - 05/07/21 1103       SLP SHORT TERM GOAL #1   Title Pt will average 85dB on loud /a/ with occasional min A over 2 sessions.    Baseline 04/16/21, 04/21/21    Status Achieved      SLP SHORT TERM GOAL #2   Title Pt will average 68dB on 18/20 sentences with occasional min A    Baseline 04/16/21    Status Achieved      SLP SHORT TERM GOAL #3   Title Pt/spouse will follow 2 diet modificiations with occasional min A over 3 sessions    Baseline 04/14/21    Time 1    Period Weeks    Status Not met     SLP SHORT TERM GOAL #4   Title Pt will complete HEP for dysphagia with usual min  visual and verbal cues over 2 sessions    Status Achieved      SLP SHORT TERM GOAL #5   Title Pt will average 68dB over 5 minute conversation with occasional min A over 2 sessions    Baseline 04/14/21, 04/21/21    Status Achieved              SLP Long Term Goals - 05/07/21 1103       SLP LONG TERM GOAL #1   Title Pt will average 85dB on loud /a/ with rare min A over 2 sessions    Baseline 04/16/21; 04/28/21    Status Achieved      SLP LONG TERM GOAL #2   Title Pt will  average 68dB over 12 minute conversation with occasional min A    Baseline  05-05-21, 05-07-21    Time 7    Period Weeks    Status Achieved      SLP LONG TERM GOAL #3   Title Pt will complete HEP for dysarthria and dysphagia with occasional min A over 3 sessions    Time 7    Period Weeks    Status Achieved      SLP LONG TERM GOAL #4   Title Pt will follow swallow precautions with occasional min A over 3 sessions    Baseline 04/14/21;    Time 7    Period Weeks    Status Achieved      SLP LONG TERM GOAL #5   Title Pt/spouse will verbalize 4 s/s of aspiration pna with rare min A    Time 7    Period Weeks    Status Achieved   spouse             Plan - 05/07/21 1100     Clinical Impression Statement Donald [name] maintains volume in short conversations (70dB) with rare to occasional min visual cues. Ongoing cues to swallow his saliva with external cues of rubber bracelet with "swallow" written on it. Cues to reduce rate also improve intelligibility. Donald [name] reports Donald [name] is carrying over swallow precautions at home 70% of the time (subjectively) with cues to slow down. Goals met, d/c ST, pt to return for re-eval in 6 months. Sooner if change in status. Pt and spouse in agreement    Speech Therapy Frequency 2x / week    Duration 12 weeks    Treatment/Interventions Language facilitation;Environmental controls;Cueing hierarchy;SLP instruction and feedback;Pharyngeal strengthening exercises;Compensatory techniques;Cognitive reorganization;Functional tasks;Compensatory strategies;Patient/family education;Multimodal communcation approach;Internal/external aids;Trials of upgraded texture/liquids;Diet toleration management by SLP;Other (comment)    Potential to Achieve Goals Fair    Potential Considerations Medical prognosis;Severity of impairments;Previous level of function             Patient will benefit from skilled therapeutic intervention in order to improve the following deficits  and impairments:   Dysarthria and anarthria  Dysphagia, oropharyngeal phase    Problem List Patient Active Problem List   Diagnosis Date Noted   HOH (hard of hearing) 12/18/2020   Personal history of colonic polyps 08/31/2016   Abnormality of gait 08/27/2014   Memory difficulty 08/27/2014   Atrial fibrillation ([place]) 09/08/2013   Cholelithiases 07/19/2013   Cholelithiasis 07/19/2013   Encounter for therapeutic drug monitoring 07/03/2013   Aortic valve replaced 04/05/2013   Parkinson disease ([place]) 09/27/2012   Orthostatic hypotension 04/01/2012   Pacemaker-Medtronic 03/29/2012   HEPATITIS A 06/04/2010   HYPERTHYROIDISM 06/04/2010   HYPERLIPIDEMIA 06/04/2010   Essential hypertension 06/04/2010   CAD 06/04/2010   AORTIC STENOSIS 06/04/2010   SICK SINUS SYNDROME 06/04/2010   HIATAL HERNIA 06/04/2010   SHORTNESS OF BREATH 06/04/2010   CHEST PAIN 06/04/2010    Amulya Quintin, Annye Rusk, [place] 05/07/2021, 12:59 PM  [facility] [address] [place], [place], [address] Phone: [phone]   Fax:  [phone]   Name: KHADEEM ROCKETT MRN: [id number] Date of Birth: 16-May-1941

## 2021-05-15 ENCOUNTER — Ambulatory Visit (INDEPENDENT_AMBULATORY_CARE_PROVIDER_SITE_OTHER): Payer: PPO

## 2021-05-15 ENCOUNTER — Other Ambulatory Visit: Payer: Self-pay

## 2021-05-15 DIAGNOSIS — Z5181 Encounter for therapeutic drug level monitoring: Secondary | ICD-10-CM | POA: Diagnosis not present

## 2021-05-15 LAB — POCT INR: INR: 2.3 (ref 2.0–3.0)

## 2021-05-15 NOTE — Patient Instructions (Signed)
Description   Today take 1 tablet then continue taking 1 tablet daily except 1/2 tablet on Sundays, Tuesdays and Thursdays. Recheck INR in 3 weeks. Call [phone] call with any new medications or if scheduled for any other procedures. Clearance/Procedure Fax #[phone]     

## 2021-05-24 ENCOUNTER — Other Ambulatory Visit: Payer: Self-pay | Admitting: Neurology

## 2021-05-24 DIAGNOSIS — G20 Parkinson's disease: Secondary | ICD-10-CM

## 2021-05-26 ENCOUNTER — Ambulatory Visit: Payer: PPO | Admitting: Neurology

## 2021-05-26 ENCOUNTER — Other Ambulatory Visit: Payer: Self-pay

## 2021-05-26 DIAGNOSIS — G20 Parkinson's disease: Secondary | ICD-10-CM

## 2021-05-26 MED ORDER — CARBIDOPA-LEVODOPA 25-100 MG PO TABS
ORAL_TABLET | ORAL | 0 refills | Status: DC
Start: 2021-05-26 — End: 2021-10-07

## 2021-05-28 ENCOUNTER — Telehealth: Payer: Self-pay | Admitting: *Deleted

## 2021-05-28 DIAGNOSIS — G20 Parkinson's disease: Secondary | ICD-10-CM | POA: Diagnosis not present

## 2021-05-28 DIAGNOSIS — R0981 Nasal congestion: Secondary | ICD-10-CM | POA: Diagnosis not present

## 2021-05-28 DIAGNOSIS — Z1152 Encounter for screening for COVID-19: Secondary | ICD-10-CM | POA: Diagnosis not present

## 2021-05-28 DIAGNOSIS — Z952 Presence of prosthetic heart valve: Secondary | ICD-10-CM | POA: Diagnosis not present

## 2021-05-28 DIAGNOSIS — R051 Acute cough: Secondary | ICD-10-CM | POA: Diagnosis not present

## 2021-05-28 DIAGNOSIS — Z7901 Long term (current) use of anticoagulants: Secondary | ICD-10-CM | POA: Diagnosis not present

## 2021-05-28 DIAGNOSIS — I4821 Permanent atrial fibrillation: Secondary | ICD-10-CM | POA: Diagnosis not present

## 2021-05-28 DIAGNOSIS — R5383 Other fatigue: Secondary | ICD-10-CM | POA: Diagnosis not present

## 2021-05-28 NOTE — Telephone Encounter (Signed)
Wife called and stated tat the pt went to see his PCP today and they checked his INR and it was 2.0; she states they told her to have him take any extra 1/2 tablet of Warfarin today then resume normal dose and call us. Advised that since we have no record of result to follow their instructions since they know what it was and obviously would like to help the INR increase. Also, she stated the pt is taking Augmentin, advised that it is safe to take with Warfarin. She is aware that since INR in below range, may need to increase dose at next visit in our office. Confirmed next week appt as well.

## 2021-06-05 ENCOUNTER — Ambulatory Visit (INDEPENDENT_AMBULATORY_CARE_PROVIDER_SITE_OTHER): Payer: PPO

## 2021-06-05 ENCOUNTER — Other Ambulatory Visit: Payer: Self-pay

## 2021-06-05 DIAGNOSIS — Z5181 Encounter for therapeutic drug level monitoring: Secondary | ICD-10-CM | POA: Diagnosis not present

## 2021-06-05 LAB — POCT INR: INR: 3 (ref 2.0–3.0)

## 2021-06-05 NOTE — Patient Instructions (Signed)
Description   Continue taking 1 tablet daily except 1/2 tablet on Sundays, Tuesdays and Thursdays. Recheck INR in 3 weeks.  Call [phone] call with any new medications or if scheduled for any other procedures. Clearance/Procedure Fax [phone]

## 2021-06-15 DIAGNOSIS — E039 Hypothyroidism, unspecified: Secondary | ICD-10-CM | POA: Diagnosis not present

## 2021-06-15 DIAGNOSIS — I119 Hypertensive heart disease without heart failure: Secondary | ICD-10-CM | POA: Diagnosis not present

## 2021-06-15 DIAGNOSIS — E7800 Pure hypercholesterolemia, unspecified: Secondary | ICD-10-CM | POA: Diagnosis not present

## 2021-06-16 NOTE — Progress Notes (Signed)
Assessment/Plan:   1.  Parkinsons Disease  -continue carbidopa/levodopa 25/100, 2 tablets @ 7am / 9:30 am/ 12 pm /2:30 pm /5 pm and 7:30 pm   -Continue carbidopa/levodopa 50/200 CR at bedtime  -I discussed with patient and wife that I do not want them to worry about dyskinesia right now.  It is not bothersome to patient.  He is moving better.  Certainly do not want to add any medication for this.  -given the following weaning schedule for ropinirole: Week 1 Decrease ropinrole, 2 mg in the AM, 3 mg at noon, 3 mg at 5pm  Week 2 Decrease ropinirole 2 mg at 7am, 2 mg at noon, 3 mg at 5pm  Week 3 Decrease ropinirole 2 mg at 7am, 2 mg at noon, 2 mg at 5pm  -Do not think this small decrease in ropinirole will change motor function.  We will need to decide if we want to continue to wean this.  Memory change has not been huge and no hallucinations.  2.  Memory change  -May have PDD, but difficult to tell given the degree of hearing loss he has.  We have fully taken him off of entacapone.  We are slightly weaning the ropinirole and can address this in the future as well.   3.  Dysphagia  -pt had mbe on 10/26 with mod oropharyngeal dysphagia.  Felt to be severe aspiration risk.       Subjective:   Donald [name] was seen today in follow up for Parkinsons disease.  My previous records were reviewed prior to todays visit as well as outside records available to me. Pt with wife who supplements the history.   Last visit, we weaned him off of the entacapone.  He has attended therapy since our last visit.  Those notes are reviewed.  He was sick in early Jan (no covid or flu) and that set him back a bit.  He had 3 falls while sick (and had just finished PT).  With one of the falls, he bent his regular walker and was forced to use the U step and he actually liked it so uses that at home now.  He has some dyskinesia.  No hallucinations.  Occasionally forgets things like a doctors appt and trouble  processing and trouble with word finding.    Current prescribed movement disorder medications: carbidopa/levodopa 25/100, 2 tablets @ 7am / 9:30 am/ 12 pm /2:30 pm /5 pm and 7:30 pm Carbidopa/levodopa 50/200 CR at bedtime Ropinirole, 3 mg 3 times per day  Prior medications: Carbidopa/levodopa 25/250; entacapone  ALLERGIES:   Allergies  Allergen Reactions   Sulfa Antibiotics     unknown    CURRENT MEDICATIONS:  Outpatient Encounter Medications as of 06/18/2021  Medication Sig   aspirin 81 MG tablet Take 81 mg by mouth daily.   atorvastatin (LIPITOR) 20 MG tablet Take 1 tablet (20 mg total) by mouth daily.   carbidopa-levodopa (SINEMET CR) 50-200 MG tablet TAKE ONE TABLET BY MOUTH EVERYDAY AT BEDTIME   carbidopa-levodopa (SINEMET IR) 25-100 MG tablet TAKE TWO TABLETS BY MOUTH SIX TIMES DAILY   esomeprazole (NEXIUM) 20 MG capsule Take 40 mg by mouth daily.   ferrous sulfate 325 (65 FE) MG tablet Take 325 mg by mouth daily with breakfast.   levothyroxine (SYNTHROID, LEVOTHROID) 125 MCG tablet Take 125 mcg by mouth daily.   metoprolol succinate (TOPROL-XL) 25 MG 24 hr tablet Take 1 tablet (25 mg total) by mouth daily.  nitroGLYCERIN (NITROSTAT) 0.4 MG SL tablet PLACE 1 TABLET UNDER TONGUE EVERY 5 MINUTES FOR 3 DOSES AS NEEDED FOR CHEST PAIN   polyethylene glycol (MIRALAX / GLYCOLAX) 17 g packet Take 17 g by mouth daily.   rOPINIRole (REQUIP) 3 MG tablet Take 1 tablet (3 mg total) by mouth in the morning, at noon, and at bedtime.   UNABLE TO FIND Med Name: Hemp oil   warfarin (COUMADIN) 5 MG tablet Take 1/2 to 1 tablet daily or as directed by Coumadin clinic.   No facility-administered encounter medications on file as of 06/18/2021.    Objective:   PHYSICAL EXAMINATION:    VITALS:   Vitals:   06/18/21 0933  BP: 119/79  Pulse: 78  SpO2: 99%  Weight: 165 lb (74.8 kg)  Height: 5\' 7"  (1.702 m)     GEN:  The patient appears stated age and is in NAD. HEENT:  Normocephalic,  atraumatic.  The mucous membranes are moist. The superficial temporal arteries are without ropiness or tenderness. CV:  RRR Lungs:  CTAB Neck/HEME:  There are no carotid bruits bilaterally.  Neurological examination:  Orientation: The patient is alert and oriented x3. Cranial nerves: There is good facial symmetry with facial hypomimia. The speech is fluent and clear. Soft palate rises symmetrically and there is no tongue deviation. Hearing is decreased to conversational tone. Sensation: Sensation is intact to light touch throughout Motor: Strength is at least antigravity x4.  Movement examination: Tone: There is normal tone in the bilateral upper extremities.  The tone in the lower extremities is normal.  Abnormal movements: There is no tremor today.  He has just mild dyskinesia today, mostly in the fingers. Coordination:  There is mild decremation with RAM's, in the left hemibody Gait and Station: The patient pushes off of the chair to arise.  He is given his walker.  He is short stepped and has start hesitation.  He festinate some.  I have reviewed and interpreted the following labs independently    Chemistry      Component Value Date/Time   NA 140 08/31/2016 0803   K 4.1 08/31/2016 0803   CL 105 08/31/2016 0803   CO2 26 09/08/2013 1008   BUN 15 08/31/2016 0803   CREATININE 0.70 08/31/2016 0803      Component Value Date/Time   CALCIUM 9.5 09/08/2013 1008   ALKPHOS 70 07/22/2013 0350   AST 29 07/22/2013 0350   ALT 25 07/22/2013 0350   BILITOT 1.5 (H) 07/22/2013 0350       Lab Results  Component Value Date   WBC 4.1 (L) 09/08/2013   HGB 13.3 08/31/2016   HCT 39.0 08/31/2016   MCV 82.8 09/08/2013   PLT 181.0 09/08/2013    No results found for: TSH   Total time spent on today's visit was 30 minutes, including both face-to-face time and nonface-to-face time.  Time included that spent on review of records (prior notes available to me/labs/imaging if pertinent),  discussing treatment and goals, answering patient's questions and coordinating care.  Cc:  [place], 09/10/2013, MD

## 2021-06-18 ENCOUNTER — Encounter: Payer: Self-pay | Admitting: Neurology

## 2021-06-18 ENCOUNTER — Other Ambulatory Visit: Payer: Self-pay

## 2021-06-18 ENCOUNTER — Ambulatory Visit (INDEPENDENT_AMBULATORY_CARE_PROVIDER_SITE_OTHER): Payer: PPO | Admitting: Neurology

## 2021-06-18 VITALS — BP 119/79 | HR 78 | Ht 67.0 in | Wt 165.0 lb

## 2021-06-18 DIAGNOSIS — R413 Other amnesia: Secondary | ICD-10-CM | POA: Diagnosis not present

## 2021-06-18 DIAGNOSIS — G20 Parkinson's disease: Secondary | ICD-10-CM | POA: Diagnosis not present

## 2021-06-18 DIAGNOSIS — G249 Dystonia, unspecified: Secondary | ICD-10-CM

## 2021-06-18 MED ORDER — ROPINIROLE HCL 2 MG PO TABS: 2.0000 mg | ORAL_TABLET | Freq: Every day | ORAL | 5 refills | Status: DC

## 2021-06-18 NOTE — Patient Instructions (Signed)
Week 1 Decrease ropinrole, 2 mg in the AM, 3 mg at noon, 3 mg at 5pm  Week 2 Decrease ropinirole 2 mg at 7am, 2 mg at noon, 3 mg at 5pm  Week 3 Decrease ropinirole 2 mg at 7am, 2 mg at noon, 2 mg at 5pm

## 2021-06-20 ENCOUNTER — Ambulatory Visit (INDEPENDENT_AMBULATORY_CARE_PROVIDER_SITE_OTHER): Payer: PPO

## 2021-06-20 DIAGNOSIS — I495 Sick sinus syndrome: Secondary | ICD-10-CM | POA: Diagnosis not present

## 2021-06-20 LAB — CUP PACEART REMOTE DEVICE CHECK
Battery Impedance: 1744 Ohm
Battery Remaining Longevity: 45 mo
Battery Voltage: 2.75 V
Brady Statistic RV Percent Paced: 57 %
Date Time Interrogation Session: 20230203075853
Implantable Lead Implant Date: 20060326
Implantable Lead Implant Date: 20060326
Implantable Lead Location: 753859
Implantable Lead Location: 753860
Implantable Lead Model: 5076
Implantable Lead Model: 5076
Implantable Pulse Generator Implant Date: 20120817
Lead Channel Impedance Value: 420 Ohm
Lead Channel Impedance Value: 67 Ohm
Lead Channel Pacing Threshold Amplitude: 0.75 V
Lead Channel Pacing Threshold Pulse Width: 0.4 ms
Lead Channel Setting Pacing Amplitude: 2.5 V
Lead Channel Setting Pacing Pulse Width: 0.4 ms
Lead Channel Setting Sensing Sensitivity: 5.6 mV

## 2021-06-24 NOTE — Progress Notes (Signed)
Electrophysiology Office Note Date: 07/01/2021  ID:  Donald [name], DOB September 03, 1940, MRN [id number]  PCP: Gaspar Garbe, MD Primary Cardiologist: None Electrophysiologist: Hillis Range, MD   CC: Pacemaker follow-up  Donald [name] is a 81 y.o. male seen today for Hillis Range, MD for routine electrophysiology followup.  Since last being seen in our clinic the patient reports doing well overall. His caregiver answers most of the questions. Denies syncope. No specific complaint today. .  he denies chest pain, palpitations, dyspnea, PND, orthopnea, nausea, vomiting, dizziness, syncope, edema, weight gain, or early satiety.  Device History: Medtronic Dual Chamber PPM implanted 01/02/2011 for SSS/Tachy-Brady    Past Medical History:  Diagnosis Date   Abnormality of gait 08/27/2014   Anxiety    Aortic stenosis    s/p AVR by Dr Laneta Simmers   Arthritis    left wrist- probable- not diagonised   Asthma    in the past   CAD (coronary artery disease)    s/p CABG 2006   GERD (gastroesophageal reflux disease)    H/O seasonal allergies    H/O: rheumatic fever    Hearing deficit    Bilateral hearing aids   Hematuria    had CT scan   Hiatal hernia    History of kidney stones    HOH (hard of hearing)    Hyperlipidemia    Hypertension    Hyperthyroidism    Inguinal hernia    right side; watching at this time per wife's report   Memory difficulty 08/27/2014   Pacemaker    Parkinson's disease    Persistent atrial fibrillation ([facility])    Scarlet fever    as a child   Shortness of breath    Sick sinus syndrome ([facility])    s/p PPM (MDT) 2006   Past Surgical History:  Procedure Laterality Date   AORTIC VALVE REPLACEMENT  2006   CARDIOVERSION N/A 09/14/2013   Procedure: CARDIOVERSION;  Surgeon: Thurmon Fair, MD;  Location: [facility] ENDOSCOPY;  Service: Cardiovascular;  Laterality: N/A;   CHOLECYSTECTOMY N/A 07/21/2013   Procedure: LAPAROSCOPIC CHOLECYSTECTOMY;  Surgeon: Mariella Saa, MD;   Location: [facility] OR;  Service: General;  Laterality: N/A;   COLONOSCOPY W/ POLYPECTOMY     COLONOSCOPY WITH PROPOFOL N/A 08/31/2016   Procedure: COLONOSCOPY WITH PROPOFOL;  Surgeon: Charlott Rakes, MD;  Location[facility];  Service: Endoscopy;  Laterality: N/A;   CORONARY ARTERY BYPASS GRAFT  2006   DENTAL SURGERY     implants   INSERT / REPLACE / REMOVE PACEMAKER  2006, 2012   MDT for sick sinus syndrome, gen change 8/12 by JA   KIDNEY STONE SURGERY  1984    Current Outpatient Medications  Medication Sig Dispense Refill   aspirin 81 MG tablet Take 81 mg by mouth daily.     atorvastatin (LIPITOR) 20 MG tablet Take 1 tablet (20 mg total) by mouth daily. 90 tablet 2   carbidopa-levodopa (SINEMET CR) 50-200 MG tablet TAKE ONE TABLET BY MOUTH EVERYDAY AT BEDTIME 90 tablet 1   carbidopa-levodopa (SINEMET IR) 25-100 MG tablet TAKE TWO TABLETS BY MOUTH SIX TIMES DAILY 1080 tablet 0   esomeprazole (NEXIUM) 20 MG capsule Take 40 mg by mouth daily.     levothyroxine (SYNTHROID, LEVOTHROID) 125 MCG tablet Take 125 mcg by mouth daily.     metoprolol succinate (TOPROL-XL) 25 MG 24 hr tablet Take 1 tablet (25 mg total) by mouth daily. 90 tablet 3   nitroGLYCERIN (NITROSTAT) 0.4 MG  SL tablet PLACE 1 TABLET UNDER TONGUE EVERY 5 MINUTES FOR 3 DOSES AS NEEDED FOR CHEST PAIN 25 tablet 3   polyethylene glycol (MIRALAX / GLYCOLAX) 17 g packet Take 17 g by mouth daily.     rOPINIRole (REQUIP) 2 MG tablet Take 1 tablet (2 mg total) by mouth at bedtime. 30 tablet 5   traMADol (ULTRAM) 50 MG tablet Take 50 mg by mouth every 6 (six) hours as needed.     warfarin (COUMADIN) 5 MG tablet Take 1/2 to 1 tablet daily or as directed by Coumadin clinic. 90 tablet 0   ferrous sulfate 325 (65 FE) MG tablet Take 325 mg by mouth daily with breakfast. (Patient not taking: Reported on 07/01/2021)     UNABLE TO FIND Med Name: Hemp oil (Patient not taking: Reported on 07/01/2021)     No current facility-administered medications  for this visit.    Allergies:   Sulfa antibiotics   Social History: Social History   Socioeconomic History   Marital status: Married    Spouse name: barbars   Number of children: 2   Years of education: HS   Highest education level: Not on file  Occupational History   Occupation: N/A  Tobacco Use   Smoking status: Never    Passive exposure: Past   Smokeless tobacco: Never  Substance and Sexual Activity   Alcohol use: No   Drug use: No   Sexual activity: Yes  Other Topics Concern   Not on file  Social History Narrative   Lives at home w/ his wife   Patient is left handed.   Patient does not drink caffeine.   Social Determinants of Health   Financial Resource Strain: Not on file  Food Insecurity: Not on file  Transportation Needs: Not on file  Physical Activity: Not on file  Stress: Not on file  Social Connections: Not on file  Intimate Partner Violence: Not on file    Family History: Family History  Problem Relation Age of Onset   Dementia Mother    Parkinsonism Mother    Heart disease Father    Heart disease Sister    Heart disease Brother    Tremor Brother        Unilateral tremor   Coronary artery disease Other        family hx of     Review of Systems: All other systems reviewed and are otherwise negative except as noted above.  Physical Exam: Vitals:   07/01/21 1117  BP: 112/66  Pulse: 72  SpO2: 97%  Weight: 158 lb (71.7 kg)  Height: 5\' 7"  (1.702 m)     GEN- The patient is well appearing, alert and oriented x 3 today.   HEENT: normocephalic, atraumatic; sclera clear, conjunctiva pink; hearing intact; oropharynx clear; neck supple  Lungs- Clear to ausculation bilaterally, normal work of breathing.  No wheezes, rales, rhonchi Heart- Regular rate and rhythm, 3-4/6 SEM, rubs or gallops  GI- soft, non-tender, non-distended, bowel sounds present  Extremities- no clubbing or cyanosis. No edema MS- no significant deformity or atrophy Skin- warm  and dry, no rash or lesion; PPM pocket well healed Psych- euthymic mood, full affect Neuro- strength and sensation are intact  PPM Interrogation- reviewed in detail today,  See PACEART report  EKG:  EKG is ordered today. Personal review of ekg ordered today shows V paced at 72 bpm with underlying AF.    Recent Labs: No results found for requested labs within last 8760 hours.  Wt Readings from Last 3 Encounters:  07/01/21 158 lb (71.7 kg)  06/18/21 165 lb (74.8 kg)  03/18/21 165 lb 8 oz (75.1 kg)     Other studies Reviewed: Additional studies/ records that were reviewed today include: Previous EP office notes, Previous remote checks, Most recent labwork.   Assessment and Plan:  1. Symptomatic bradycardia s/p Medtronic PPM  Normal PPM function See Pace Art report No changes today  2. Permanent Afib Rate controlled Asymptomatic Continue coumadin   3. HTN Stable on current regimen    4. S/p AVR 2006 Continue coumadin and ASA His murmur is quite loud and has been some time since imaging.  Update Echo. Referral to gen cards/structural based on results.    Current medicines are reviewed at length with the patient today.    Labs/ tests ordered today include:  Orders Placed This Encounter  Procedures   Comprehensive metabolic panel   CBC   EKG 12-Lead   ECHOCARDIOGRAM COMPLETE     Disposition:   Follow up with EP APP in 12 months for pacemaker follow up    Signed, Shirley Friar, PA-C  07/01/2021 11:47 AM  [facility] [place] [address] [phone] (office) [phone] (fax)

## 2021-06-25 ENCOUNTER — Other Ambulatory Visit: Payer: Self-pay | Admitting: Internal Medicine

## 2021-06-25 NOTE — Progress Notes (Signed)
Remote pacemaker transmission.   

## 2021-06-25 NOTE — Telephone Encounter (Signed)
Prescription refill request received for warfarin [place]: 04/24/21 Lalla [facility])  Next INR check: 06/26/21 Warfarin tablet strength: 5mg   Appropriate dose and refill sent to requested pharmacy.

## 2021-06-26 ENCOUNTER — Ambulatory Visit (INDEPENDENT_AMBULATORY_CARE_PROVIDER_SITE_OTHER): Payer: PPO | Admitting: *Deleted

## 2021-06-26 ENCOUNTER — Other Ambulatory Visit: Payer: Self-pay

## 2021-06-26 DIAGNOSIS — Z5181 Encounter for therapeutic drug level monitoring: Secondary | ICD-10-CM

## 2021-06-26 LAB — POCT INR: INR: 2.5 (ref 2.0–3.0)

## 2021-06-26 NOTE — Patient Instructions (Signed)
Description   Continue taking 1 tablet daily except 1/2 tablet on Sundays, Tuesdays and Thursdays. Recheck INR in 4 weeks. Call [phone] call with any new medications or if scheduled for any other procedures. Clearance/Procedure Fax #[phone]

## 2021-07-01 ENCOUNTER — Ambulatory Visit (INDEPENDENT_AMBULATORY_CARE_PROVIDER_SITE_OTHER): Payer: PPO | Admitting: Student

## 2021-07-01 ENCOUNTER — Encounter: Payer: Self-pay | Admitting: Student

## 2021-07-01 ENCOUNTER — Other Ambulatory Visit: Payer: Self-pay

## 2021-07-01 VITALS — BP 112/66 | HR 72 | Ht 67.0 in | Wt 158.0 lb

## 2021-07-01 DIAGNOSIS — I4821 Permanent atrial fibrillation: Secondary | ICD-10-CM

## 2021-07-01 DIAGNOSIS — I495 Sick sinus syndrome: Secondary | ICD-10-CM | POA: Diagnosis not present

## 2021-07-01 DIAGNOSIS — I10 Essential (primary) hypertension: Secondary | ICD-10-CM | POA: Diagnosis not present

## 2021-07-01 DIAGNOSIS — Z952 Presence of prosthetic heart valve: Secondary | ICD-10-CM | POA: Diagnosis not present

## 2021-07-01 LAB — CUP PACEART INCLINIC DEVICE CHECK
Date Time Interrogation Session: 20230214122851
Implantable Lead Implant Date: 20060326
Implantable Lead Implant Date: 20060326
Implantable Lead Location: 753859
Implantable Lead Location: 753860
Implantable Lead Model: 5076
Implantable Lead Model: 5076
Implantable Pulse Generator Implant Date: 20120817

## 2021-07-01 LAB — CBC
Hematocrit: 36.3 % — ABNORMAL LOW (ref 37.5–51.0)
Hemoglobin: 11.9 g/dL — ABNORMAL LOW (ref 13.0–17.7)
MCH: 27.7 pg (ref 26.6–33.0)
MCHC: 32.8 g/dL (ref 31.5–35.7)
MCV: 85 fL (ref 79–97)
Platelets: 235 10*3/uL (ref 150–450)
RBC: 4.29 x10E6/uL (ref 4.14–5.80)
RDW: 14.5 % (ref 11.6–15.4)
WBC: 4.8 10*3/uL (ref 3.4–10.8)

## 2021-07-01 LAB — COMPREHENSIVE METABOLIC PANEL
ALT: 7 IU/L (ref 0–44)
AST: 15 IU/L (ref 0–40)
Albumin/Globulin Ratio: 1.7 (ref 1.2–2.2)
Albumin: 4.1 g/dL (ref 3.7–4.7)
Alkaline Phosphatase: 174 IU/L — ABNORMAL HIGH (ref 44–121)
BUN/Creatinine Ratio: 21 (ref 10–24)
BUN: 13 mg/dL (ref 8–27)
Bilirubin Total: 1.4 mg/dL — ABNORMAL HIGH (ref 0.0–1.2)
CO2: 25 mmol/L (ref 20–29)
Calcium: 9.3 mg/dL (ref 8.6–10.2)
Chloride: 102 mmol/L (ref 96–106)
Creatinine, Ser: 0.62 mg/dL — ABNORMAL LOW (ref 0.76–1.27)
Globulin, Total: 2.4 g/dL (ref 1.5–4.5)
Glucose: 97 mg/dL (ref 70–99)
Potassium: 4.5 mmol/L (ref 3.5–5.2)
Sodium: 139 mmol/L (ref 134–144)
Total Protein: 6.5 g/dL (ref 6.0–8.5)
eGFR: 97 mL/min/{1.73_m2} (ref >59)

## 2021-07-01 NOTE — Patient Instructions (Signed)
Medication Instructions:  Your physician recommends that you continue on your current medications as directed. Please refer to the Current Medication list given to you today.  *If you need a refill on your cardiac medications before your next appointment, please call your pharmacy*   Lab Work: TODAY: CMET, CBC  If you have labs (blood work) drawn today and your tests are completely normal, you will receive your results only by: MyChart Message (if you have MyChart) OR A paper copy in the mail If you have any lab test that is abnormal or we need to change your treatment, we will call you to review the results.   Testing/Procedures: Your physician has requested that you have an echocardiogram. Echocardiography is a painless test that uses sound waves to create images of your heart. It provides your doctor with information about the size and shape of your heart and how well your hearts chambers and valves are working. This procedure takes approximately one hour. There are no restrictions for this procedure.   Follow-Up: At [facility], you and your health needs are our priority.  As part of our continuing mission to provide you with exceptional heart care, we have created designated Provider Care Teams.  These Care Teams include your primary Cardiologist (physician) and Advanced Practice Providers (APPs -  Physician Assistants and Nurse Practitioners) who all work together to provide you with the care you need, when you need it.   Your next appointment:   1 year(s)  The format for your next appointment:   In Person  Provider:   You may see Hillis Range, MD or one of the following Advanced Practice Providers on your designated Care Team:   Francis Dowse, [place] Casimiro Needle "[facility]" [place], [place]

## 2021-07-02 ENCOUNTER — Ambulatory Visit: Payer: PPO | Admitting: Podiatry

## 2021-07-02 DIAGNOSIS — L03032 Cellulitis of left toe: Secondary | ICD-10-CM

## 2021-07-02 DIAGNOSIS — B351 Tinea unguium: Secondary | ICD-10-CM

## 2021-07-02 DIAGNOSIS — M79676 Pain in unspecified toe(s): Secondary | ICD-10-CM

## 2021-07-02 MED ORDER — AMOXICILLIN-POT CLAVULANATE 875-125 MG PO TABS: 1.0000 | ORAL_TABLET | Freq: Two times a day (BID) | ORAL | 0 refills | Status: DC

## 2021-07-02 MED ORDER — MUPIROCIN 2 % EX OINT: 1.0000 "application " | TOPICAL_OINTMENT | Freq: Two times a day (BID) | CUTANEOUS | 1 refills | Status: DC

## 2021-07-02 NOTE — Progress Notes (Signed)
° °  SUBJECTIVE Patient presents to office today complaining of elongated, thickened nails that cause pain while ambulating in shoes.  Patient is unable to trim their own nails. Patient also has been experiencing swelling with redness to the left great toe. Denies any injury. They have not done anything for treatment. They state the toe has only been present for about a week now. Patient is here for further evaluation and treatment.  Past Medical History:  Diagnosis Date   Abnormality of gait 08/27/2014   Anxiety    Aortic stenosis    s/p AVR by Dr Cyndia Bent   Arthritis    left wrist- probable- not diagonised   Asthma    in the past   CAD (coronary artery disease)    s/p CABG 2006   GERD (gastroesophageal reflux disease)    H/O seasonal allergies    H/O: rheumatic fever    Hearing deficit    Bilateral hearing aids   Hematuria    had CT scan   Hiatal hernia    History of kidney stones    HOH (hard of hearing)    Hyperlipidemia    Hypertension    Hyperthyroidism    Inguinal hernia    right side; watching at this time per wife's report   Memory difficulty 08/27/2014   Pacemaker    Parkinson's disease    Persistent atrial fibrillation ([place])    Scarlet fever    as a child   Shortness of breath    Sick sinus syndrome ([place])    s/p PPM (MDT) 2006    OBJECTIVE General Patient is awake, alert, and oriented x 3 and in no acute distress. Derm Skin is dry and supple bilateral. Negative open lesions or macerations. Remaining integument unremarkable. Nails are tender, long, thickened and dystrophic with subungual debris, consistent with onychomycosis, 1-5 bilateral. No signs of infection noted. No open wounds noted especially around the left hallux. Vasc  DP and PT pedal pulses palpable bilaterally. There is some localized erythema and edema noted to the left hallux with associated tenderness.  Neuro Epicritic and protective threshold sensation grossly intact bilaterally.  Musculoskeletal  Exam No symptomatic pedal deformities noted bilateral. Muscular strength within normal limits. Associated tenderness noted to the left hallux nail plate.  ASSESSMENT 1.  Pain due to onychomycosis of toenails both 2. Erythema left hallux  PLAN OF CARE 1. Patient evaluated today.  2. Instructed to maintain good pedal hygiene and foot care.  3. Mechanical debridement of nails 1-5 bilaterally performed using a nail nipper. Filed with dremel without incident.  4. Rx mupirocin ointment 5. Rx augmentin 875/125mg  BID #14 6. Return to clinic in 3 weeks   Edrick Kins, DPM [facility]  Dr. Edrick Kins, DPM    [address], [place] [address]                Office [phone]  Fax [phone]

## 2021-07-03 ENCOUNTER — Telehealth: Payer: Self-pay | Admitting: Licensed Clinical Social Worker

## 2021-07-03 NOTE — Telephone Encounter (Signed)
Note was sent to CMA to follow up on

## 2021-07-03 NOTE — Telephone Encounter (Signed)
Called patients wife back for a little more information. Called patients wife back to ask about the falls. Patient has been on Requip since 08-22 3 MG  This last appointment on 06/18/21 Dr. Arbutus Leas has reduced the Requip to 2 mg after tapering the dose.  Patients wife said he has fallen around 5 or 6 times in the last three weeks before the reduction of the Requip

## 2021-07-03 NOTE — Telephone Encounter (Signed)
LCSW received a call from [facility] wanting a call back from nurse .  She is inquiring if Pt's medication Ropinirole is causing falls.  She reports increase in falls the past two weeks .  LCSW relayed that she would pass this message along however MD was out of the office this afternoon.  She reports "that is fine, Pt will keep taking the medication until follow up call.".

## 2021-07-07 NOTE — Telephone Encounter (Signed)
Called patients [name] this morning and I am receiving a busy signal will try to call again

## 2021-07-07 NOTE — Telephone Encounter (Signed)
Called patients wife again and received busy signal. Tried second phone number and no answer

## 2021-07-09 ENCOUNTER — Telehealth: Payer: Self-pay | Admitting: Neurology

## 2021-07-09 ENCOUNTER — Other Ambulatory Visit: Payer: Self-pay

## 2021-07-09 DIAGNOSIS — G20 Parkinson's disease: Secondary | ICD-10-CM

## 2021-07-09 MED ORDER — ROPINIROLE HCL 2 MG PO TABS: ORAL_TABLET | ORAL | 5 refills | Status: DC

## 2021-07-09 NOTE — Telephone Encounter (Signed)
Prescription has been resent

## 2021-07-09 NOTE — Telephone Encounter (Signed)
Ally from [facility] called about pts prescription Requip. They need a call back to discuss pts RX. Pts wife said she is out of this medicine

## 2021-07-09 NOTE — Telephone Encounter (Signed)
Called patients wife today she had called office and I realized her phone number had been transposed in the system and that was why I could not reach her. Patient needed new prescription sent in for Requip and also spoke to patients wife about PT she feels as if it didn't help patient in the past because he would not continue with the therapy at home and do his exercises

## 2021-07-15 DIAGNOSIS — E039 Hypothyroidism, unspecified: Secondary | ICD-10-CM | POA: Diagnosis not present

## 2021-07-15 DIAGNOSIS — I119 Hypertensive heart disease without heart failure: Secondary | ICD-10-CM | POA: Diagnosis not present

## 2021-07-15 DIAGNOSIS — E7800 Pure hypercholesterolemia, unspecified: Secondary | ICD-10-CM | POA: Diagnosis not present

## 2021-07-21 ENCOUNTER — Other Ambulatory Visit: Payer: Self-pay

## 2021-07-21 ENCOUNTER — Ambulatory Visit (INDEPENDENT_AMBULATORY_CARE_PROVIDER_SITE_OTHER): Payer: PPO | Admitting: *Deleted

## 2021-07-21 ENCOUNTER — Ambulatory Visit (HOSPITAL_COMMUNITY): Payer: PPO | Attending: Internal Medicine

## 2021-07-21 DIAGNOSIS — I495 Sick sinus syndrome: Secondary | ICD-10-CM | POA: Diagnosis not present

## 2021-07-21 DIAGNOSIS — I4821 Permanent atrial fibrillation: Secondary | ICD-10-CM | POA: Insufficient documentation

## 2021-07-21 DIAGNOSIS — Z5181 Encounter for therapeutic drug level monitoring: Secondary | ICD-10-CM

## 2021-07-21 DIAGNOSIS — Z952 Presence of prosthetic heart valve: Secondary | ICD-10-CM

## 2021-07-21 LAB — ECHOCARDIOGRAM COMPLETE
AR max vel: 0.85 cm2
AV Area VTI: 0.93 cm2
AV Area mean vel: 0.9 cm2
AV Mean grad: 23 mmHg
AV Peak grad: 37.7 mmHg
Ao pk vel: 3.07 m/s
Area-P 1/2: 3.6 cm2
P 1/2 time: 406 msec
S' Lateral: 3.2 cm

## 2021-07-21 LAB — POCT INR: INR: 2.4 (ref 2.0–3.0)

## 2021-07-21 NOTE — Patient Instructions (Signed)
Description   Take 1.5 tablets of warfarin today and then continue taking 1 tablet daily except 1/2 tablet on Sundays, Tuesdays and Thursdays. Recheck INR in 3 weeks. Call [phone] call with any new medications or if scheduled for any other procedures. Clearance/Procedure Fax #[phone]

## 2021-07-22 ENCOUNTER — Other Ambulatory Visit: Payer: Self-pay

## 2021-07-22 DIAGNOSIS — Z952 Presence of prosthetic heart valve: Secondary | ICD-10-CM

## 2021-07-22 NOTE — Progress Notes (Signed)
am

## 2021-07-23 ENCOUNTER — Ambulatory Visit: Payer: PPO | Admitting: Podiatry

## 2021-07-23 ENCOUNTER — Other Ambulatory Visit: Payer: Self-pay

## 2021-07-23 DIAGNOSIS — L03032 Cellulitis of left toe: Secondary | ICD-10-CM

## 2021-07-23 NOTE — Progress Notes (Signed)
? ?  SUBJECTIVE ?Patient PMHx Parkinson's disease nonambulatory presents to office today for follow-up evaluation of redness with swelling to the left great toe.  Patient completed the oral antibiotic Augmentin 875/125 mg and he is doing well.  The pain has resolved as well as the redness and swelling.  No new complaints at this time ? ?Past Medical History:  ?Diagnosis Date  ? Abnormality of gait 08/27/2014  ? Anxiety   ? Aortic stenosis   ? s/p AVR by Dr Laneta Simmers  ? Arthritis   ? left wrist- probable- not diagonised  ? Asthma   ? in the past  ? CAD (coronary artery disease)   ? s/p CABG 2006  ? GERD (gastroesophageal reflux disease)   ? H/O seasonal allergies   ? H/O: rheumatic fever   ? Hearing deficit   ? Bilateral hearing aids  ? Hematuria   ? had CT scan  ? Hiatal hernia   ? History of kidney stones   ? HOH (hard of hearing)   ? Hyperlipidemia   ? Hypertension   ? Hyperthyroidism   ? Inguinal hernia   ? right side; watching at this time per wife's report  ? Memory difficulty 08/27/2014  ? Pacemaker   ? Parkinson's disease   ? Persistent atrial fibrillation ([facility])   ? Scarlet fever   ? as a child  ? Shortness of breath   ? Sick sinus syndrome ([facility])   ? s/p PPM (MDT) 2006  ? ? ?OBJECTIVE ?General Patient is awake, alert, and oriented x 3 and in no acute distress. ?Derm Skin is dry and supple bilateral. Negative open lesions or macerations. Remaining integument unremarkable.  ?Vasc  DP and PT pedal pulses palpable bilaterally.  The erythema and edema to the left hallux has resolved.  ?Neuro Epicritic and protective threshold sensation grossly intact bilaterally.  ?Musculoskeletal Exam No symptomatic pedal deformities noted bilateral. Muscular strength within normal limits.  Negative for any significant tenderness to the left hallux nail plate. ? ?ASSESSMENT ?1.  Erythema left hallux; resolved ? ?PLAN OF CARE ?1. Patient evaluated today.  ?2.  Continue to maintain good pedal hygiene and foot care.  ?3.  Patient  completed the oral antibiotics as instructed.  They also apply the mupirocin ointment.  It appears that the toe is healthy and viable and the erythema to the left hallux has resolved ?4.  Return to clinic as needed ? ?Felecia Shelling, DPM ?[facility] ? ?Dr. Felecia Shelling, DPM  ?  ?[address]                                     ?[place], [place] [address]                ?Office [phone]  ?Fax [phone] ? ? ? ? ?

## 2021-08-11 NOTE — Progress Notes (Signed)
?Cardiology Office Note:   ? ?Date:  08/13/2021  ? ?ID:  Donald [name], DOB 1940/11/03, MRN [id number] ? ?PCP:  Haywood Pao, MD  ?Cardiologist:  None   ? ?Referring MD: Shirley Friar*  ? ? ? ?History of Present Illness:   ? ?Donald [name] is a 81 y.o. male with a hx of hypertension, hyperlipidemia, Afib on coumadin, and aortic stenosis, and s/p aortic valve replacement (2006) here today for management of aortic valve replacement. ? ?He saw Barrington Ellison 07/01/21 and was doing well. Most recent echo showed LVEF 50 to 55%, aortic dilation to 39 mm, and mean gradients through mechanical AV are 35 and 23 mmHg. He was referred to general cardiology.  ? ?Today, he is accompanied by a family member. Overall, he had been doing well. His family member reports he has a history of falls. He reports his pacemaker has 3 more years. He is compliant and tolerating his medications.  ? ?He reports the tips of his fingers will feel numb and cause him to drop objects. He denies any palpitations, chest pain, or shortness of breath, lightheadedness, headaches, syncope, orthopnea, PND, or lower extremity edema. ? ?Past Medical History:  ?Diagnosis Date  ? Abnormality of gait 08/27/2014  ? Anxiety   ? Aortic stenosis   ? s/p AVR by Dr Cyndia Bent  ? Arthritis   ? left wrist- probable- not diagonised  ? Asthma   ? in the past  ? CAD (coronary artery disease)   ? s/p CABG 2006  ? GERD (gastroesophageal reflux disease)   ? H/O seasonal allergies   ? H/O: rheumatic fever   ? Hearing deficit   ? Bilateral hearing aids  ? Hematuria   ? had CT scan  ? Hiatal hernia   ? History of kidney stones   ? HOH (hard of hearing)   ? Hyperlipidemia   ? Hypertension   ? Hyperthyroidism   ? Inguinal hernia   ? right side; watching at this time per wife's report  ? Memory difficulty 08/27/2014  ? Pacemaker   ? Parkinson's disease   ? Persistent atrial fibrillation ([place])   ? Scarlet fever   ? as a child  ? Shortness of breath   ? Sick sinus syndrome  ([place])   ? s/p PPM (MDT) 2006  ? ? ?Past Surgical History:  ?Procedure Laterality Date  ? AORTIC VALVE REPLACEMENT  2006  ? CARDIOVERSION N/A 09/14/2013  ? Procedure: CARDIOVERSION;  Surgeon: Sanda Klein, MD;  Location: [facility];  Service: Cardiovascular;  Laterality: N/A;  ? CHOLECYSTECTOMY N/A 07/21/2013  ? Procedure: LAPAROSCOPIC CHOLECYSTECTOMY;  Surgeon: Edward Jolly, MD;  Location: [facility] OR;  Service: General;  Laterality: N/A;  ? COLONOSCOPY W/ POLYPECTOMY    ? COLONOSCOPY WITH PROPOFOL N/A 08/31/2016  ? Procedure: COLONOSCOPY WITH PROPOFOL;  Surgeon: Wilford Corner, MD;  Location: [facility] ENDOSCOPY;  Service: Endoscopy;  Laterality: N/A;  ? CORONARY ARTERY BYPASS GRAFT  2006  ? DENTAL SURGERY    ? implants  ? INSERT / REPLACE / REMOVE PACEMAKER  2006, 2012  ? MDT for sick sinus syndrome, gen change 8/12 by JA  ? [facility]  ? ? ?Current Medications: ?Current Meds  ?Medication Sig  ? amoxicillin (AMOXIL) 500 MG capsule Take 4 capsules by mouth as directed. Prior to dental appts  ? aspirin 81 MG tablet Take 81 mg by mouth daily.  ? atorvastatin (LIPITOR) 20 MG tablet Take 1 tablet (20 mg  total) by mouth daily.  ? carbidopa-levodopa (SINEMET CR) 50-200 MG tablet TAKE ONE TABLET BY MOUTH EVERYDAY AT BEDTIME  ? carbidopa-levodopa (SINEMET IR) 25-100 MG tablet TAKE TWO TABLETS BY MOUTH SIX TIMES DAILY  ? esomeprazole (NEXIUM) 20 MG capsule Take 40 mg by mouth daily.  ? levothyroxine (SYNTHROID, LEVOTHROID) 125 MCG tablet Take 125 mcg by mouth daily.  ? metoprolol succinate (TOPROL-XL) 25 MG 24 hr tablet Take 1 tablet (25 mg total) by mouth daily.  ? nitroGLYCERIN (NITROSTAT) 0.4 MG SL tablet PLACE 1 TABLET UNDER TONGUE EVERY 5 MINUTES FOR 3 DOSES AS NEEDED FOR CHEST PAIN  ? rOPINIRole (REQUIP) 2 MG tablet 2 mg at 7am, 2 mg at noon, 2 mg at 5pm  ? warfarin (COUMADIN) 5 MG tablet Take 1/2 to 1 tablet daily or as directed by Coumadin clinic.  ?  ? ?Allergies:   Sulfa antibiotics  ? ?Social History   ? ?Socioeconomic History  ? Marital status: Married  ?  Spouse name: barbars  ? Number of children: 2  ? Years of education: HS  ? Highest education level: Not on file  ?Occupational History  ? Occupation: N/A  ?Tobacco Use  ? Smoking status: Never  ?  Passive exposure: Past  ? Smokeless tobacco: Never  ?Substance and Sexual Activity  ? Alcohol use: No  ? Drug use: No  ? Sexual activity: Yes  ?Other Topics Concern  ? Not on file  ?Social History Narrative  ? Lives at home w/ his wife  ? Patient is left handed.  ? Patient does not drink caffeine.  ? ?Social Determinants of Health  ? ?Financial Resource Strain: Not on file  ?Food Insecurity: Not on file  ?Transportation Needs: Not on file  ?Physical Activity: Not on file  ?Stress: Not on file  ?Social Connections: Not on file  ?  ? ?Family History: ?The patient's family history includes Coronary artery disease in an other family member; Dementia in his mother; Heart disease in his brother, father, and sister; Parkinsonism in his mother; Tremor in his brother. ? ?ROS:   ?Please see the history of present illness.    ?(+) Falls ?(+) Numbness (Tips of bilateral fingers) ?All other systems reviewed and negative.  ? ?EKGs/Labs/Other Studies Reviewed:   ? ?The following studies were reviewed today: ?Echo 07/21/21 ?1. Septal motion consistent with conduction delay. Left ventricular ejection fraction, by estimation, is 50 to 55%. The left ventricle has low normal function. There is mild left ventricular hypertrophy. Left ventricular diastolic parameters are indeterminate.  ? 2. Right ventricular systolic function is normal. The right ventricular size is mildly enlarged. There is moderately elevated pulmonary artery systolic pressure.  ? 3. Left atrial size was severely dilated.  ? 4. Right atrial size was severely dilated.  ? 5. Mild mitral valve regurgitation.  ? 6. S/P AVR (2006) with mechanical AVR. Unable to find any further information. Peak and mean gradients through the  valve are 35 and 23 mm Hg. Dimensionless index is 0.3 consistent with moderate AS.  ? 7. Aortic dilatation noted. There is mild dilatation of the ascending aorta, measuring 39 mm.  ? 8. The inferior vena cava is dilated in size with <50% respiratory variability, suggesting right atrial pressure of 15 mmHg.  Personally reviewed and interpreted. ? ?EKG:  EKG was not ordered today ? ?Recent Labs: ?07/01/2021: ALT 7; BUN 13; Creatinine, Ser 0.62; Hemoglobin 11.9; Platelets 235; Potassium 4.5; Sodium 139  ? ?Recent Lipid Panel ?No results found  for: CHOL, TRIG, HDL, CHOLHDL, VLDL, LDLCALC, LDLDIRECT ? ?CHA2DS2-VASc Score =   [ ] .  Therefore, the patient's annual risk of stroke is   %.    ?  ? ? ?Physical Exam:   ? ?VS:  BP 116/60   Pulse 63   Ht 5\' 11"  (1.803 m)   Wt 151 lb (68.5 kg)   SpO2 93%   BMI 21.06 kg/m?    ? ?Wt Readings from Last 3 Encounters:  ?08/13/21 151 lb (68.5 kg)  ?07/01/21 158 lb (71.7 kg)  ?06/18/21 165 lb (74.8 kg)  ?  ? ?GEN: Well nourished, well developed in no acute distress ?HEENT: Normal ?NECK: No JVD; No carotid bruits ?LYMPHATICS: No lymphadenopathy ?CARDIAC: RRR, sharp S2 click, 2/6 systolic murmur, no rubs or gallops ?RESPIRATORY:  Clear to auscultation without rales, wheezing or rhonchi  ?ABDOMEN: Soft, non-tender, non-distended ?MUSCULOSKELETAL:  No edema; No deformity  ?SKIN: Warm and dry ?NEUROLOGIC:  Alert and oriented x 3 ?PSYCHIATRIC:  Normal affect  ? ?ASSESSMENT:   ? ?1. Coronary artery disease involving native coronary artery of native heart without angina pectoris   ?2. Orthostatic hypotension   ?3. Parkinson disease ([place])   ?4. Pacemaker-[employer]   ?5. Mechanical aortic valve   ?6. Permanent atrial fibrillation ([place])   ? ?PLAN:   ? ?Coronary artery disease involving native coronary artery of native heart without angina pectoris ?CABG 2006.  No anginal symptoms.  On aspirin 81 mg because of mechanical aortic valve.  Also on Coumadin.  Taking metoprolol, atorvastatin.   Excellent. ? ?Orthostatic hypotension ?No recent episodes.  Be careful when standing up.  Likely autonomic as it relates to Parkinson's.  He does wear a helmet just in case he falls with his Coumadin. ? ?Parkin

## 2021-08-12 DIAGNOSIS — E039 Hypothyroidism, unspecified: Secondary | ICD-10-CM | POA: Diagnosis not present

## 2021-08-12 DIAGNOSIS — Z125 Encounter for screening for malignant neoplasm of prostate: Secondary | ICD-10-CM | POA: Diagnosis not present

## 2021-08-12 DIAGNOSIS — E7800 Pure hypercholesterolemia, unspecified: Secondary | ICD-10-CM | POA: Diagnosis not present

## 2021-08-12 DIAGNOSIS — R82998 Other abnormal findings in urine: Secondary | ICD-10-CM | POA: Diagnosis not present

## 2021-08-12 DIAGNOSIS — I119 Hypertensive heart disease without heart failure: Secondary | ICD-10-CM | POA: Diagnosis not present

## 2021-08-13 ENCOUNTER — Encounter: Payer: Self-pay | Admitting: Cardiology

## 2021-08-13 ENCOUNTER — Ambulatory Visit: Payer: PPO | Admitting: Cardiology

## 2021-08-13 ENCOUNTER — Ambulatory Visit (INDEPENDENT_AMBULATORY_CARE_PROVIDER_SITE_OTHER): Payer: PPO | Admitting: *Deleted

## 2021-08-13 ENCOUNTER — Other Ambulatory Visit: Payer: Self-pay

## 2021-08-13 DIAGNOSIS — I2510 Atherosclerotic heart disease of native coronary artery without angina pectoris: Secondary | ICD-10-CM | POA: Diagnosis not present

## 2021-08-13 DIAGNOSIS — I951 Orthostatic hypotension: Secondary | ICD-10-CM | POA: Diagnosis not present

## 2021-08-13 DIAGNOSIS — Z952 Presence of prosthetic heart valve: Secondary | ICD-10-CM

## 2021-08-13 DIAGNOSIS — Z5181 Encounter for therapeutic drug level monitoring: Secondary | ICD-10-CM | POA: Diagnosis not present

## 2021-08-13 DIAGNOSIS — I4821 Permanent atrial fibrillation: Secondary | ICD-10-CM

## 2021-08-13 DIAGNOSIS — Z950 Presence of cardiac pacemaker: Secondary | ICD-10-CM | POA: Diagnosis not present

## 2021-08-13 DIAGNOSIS — G20 Parkinson's disease: Secondary | ICD-10-CM | POA: Diagnosis not present

## 2021-08-13 LAB — POCT INR: INR: 2.4 (ref 2.0–3.0)

## 2021-08-13 NOTE — Patient Instructions (Signed)
Description   ?Take 1.5 tablets of warfarin today and then start taking 1 tablet daily except 1/2 tablet on Sundays and Thursdays. Recheck INR in 3 weeks. Call [phone] call with any new medications or if scheduled for any other procedures. Clearance/Procedure Fax #[phone] ?  ?  ?

## 2021-08-13 NOTE — Assessment & Plan Note (Signed)
No recent episodes.  Be careful when standing up.  Likely autonomic as it relates to Parkinson's.  He does wear a helmet just in case he falls with his Coumadin. ?

## 2021-08-13 NOTE — Assessment & Plan Note (Signed)
Currently on Sinemet.  Neurology.  Also having some tingling in his fingertips.  Likely neuropathy. ?

## 2021-08-13 NOTE — Assessment & Plan Note (Signed)
Currently well rate controlled.  On metoprolol.  No changes made.  Pacemaker in place to prevent bradycardia. ?

## 2021-08-13 NOTE — Assessment & Plan Note (Signed)
CABG 2006.  No anginal symptoms.  On aspirin 81 mg because of mechanical aortic valve.  Also on Coumadin.  Taking metoprolol, atorvastatin.  Excellent. ?

## 2021-08-13 NOTE — Patient Instructions (Signed)
Medication Instructions:  ?Your physician recommends that you continue on your current medications as directed. Please refer to the Current Medication list given to you today. ? ?*If you need a refill on your cardiac medications before your next appointment, please call your pharmacy* ? ?Lab Work: ?If you have labs (blood work) drawn today and your tests are completely normal, you will receive your results only by: ?MyChart Message (if you have MyChart) OR ?A paper copy in the mail ?If you have any lab test that is abnormal or we need to change your treatment, we will call you to review the results. ? ?Follow-Up: ?At [facility], you and your health needs are our priority.  As part of our continuing mission to provide you with exceptional heart care, we have created designated Provider Care Teams.  These Care Teams include your primary Cardiologist (physician) and Advanced Practice Providers (APPs -  Physician Assistants and Nurse Practitioners) who all work together to provide you with the care you need, when you need it. ? ?We recommend signing up for the patient portal called "MyChart".  Sign up information is provided on this After Visit Summary.  MyChart is used to connect with patients for Virtual Visits (Telemedicine).  Patients are able to view lab/test results, encounter notes, upcoming appointments, etc.  Non-urgent messages can be sent to your provider as well.   ?To learn more about what you can do with MyChart, go to [email or web address].   ? ?Your next appointment:   ?6 month(s) ? ?The format for your next appointment:   ?In Person ? ?Provider:   ?Chelsea Aus, PA-C, Jari Favre, PA-C, Ronie Spies, PA-C, Robin Searing, NP, Nada Boozer, NP, Jacolyn Reedy, PA-C, Eligha Bridegroom, NP, Tereso Newcomer, PA-C, or Cyndi Bender, NP     Then, None will plan to see you again in 1 year(s).{ ? ?

## 2021-08-13 NOTE — Assessment & Plan Note (Signed)
Followed by EP.  Last seen by [place].  Excellent. ?

## 2021-08-13 NOTE — Assessment & Plan Note (Signed)
On Coumadin.  INR reviewed.  Closely monitoring.  Also has dental prophylaxis.  Excellent.  On aspirin 81 mg as well.  His gradients were little higher than expected for mechanical valve, 23 mm mean.  We will continue to monitor.  We will check an echocardiogram again in 1 year.  Continue with Coumadin.  Sharp S2 click heard on exam.  Asymptomatic. ?

## 2021-08-19 DIAGNOSIS — E7800 Pure hypercholesterolemia, unspecified: Secondary | ICD-10-CM | POA: Diagnosis not present

## 2021-08-19 DIAGNOSIS — E039 Hypothyroidism, unspecified: Secondary | ICD-10-CM | POA: Diagnosis not present

## 2021-08-19 DIAGNOSIS — H900 Conductive hearing loss, bilateral: Secondary | ICD-10-CM | POA: Diagnosis not present

## 2021-08-19 DIAGNOSIS — D509 Iron deficiency anemia, unspecified: Secondary | ICD-10-CM | POA: Diagnosis not present

## 2021-08-19 DIAGNOSIS — N401 Enlarged prostate with lower urinary tract symptoms: Secondary | ICD-10-CM | POA: Diagnosis not present

## 2021-08-19 DIAGNOSIS — G20 Parkinson's disease: Secondary | ICD-10-CM | POA: Diagnosis not present

## 2021-08-19 DIAGNOSIS — Z1389 Encounter for screening for other disorder: Secondary | ICD-10-CM | POA: Diagnosis not present

## 2021-08-19 DIAGNOSIS — I119 Hypertensive heart disease without heart failure: Secondary | ICD-10-CM | POA: Diagnosis not present

## 2021-08-19 DIAGNOSIS — D692 Other nonthrombocytopenic purpura: Secondary | ICD-10-CM | POA: Diagnosis not present

## 2021-08-19 DIAGNOSIS — Z0000 Encounter for general adult medical examination without abnormal findings: Secondary | ICD-10-CM | POA: Diagnosis not present

## 2021-08-19 DIAGNOSIS — I25810 Atherosclerosis of coronary artery bypass graft(s) without angina pectoris: Secondary | ICD-10-CM | POA: Diagnosis not present

## 2021-08-19 DIAGNOSIS — D6869 Other thrombophilia: Secondary | ICD-10-CM | POA: Diagnosis not present

## 2021-08-19 DIAGNOSIS — Z23 Encounter for immunization: Secondary | ICD-10-CM | POA: Diagnosis not present

## 2021-08-19 DIAGNOSIS — Z1331 Encounter for screening for depression: Secondary | ICD-10-CM | POA: Diagnosis not present

## 2021-08-19 DIAGNOSIS — Z7901 Long term (current) use of anticoagulants: Secondary | ICD-10-CM | POA: Diagnosis not present

## 2021-09-03 ENCOUNTER — Ambulatory Visit (INDEPENDENT_AMBULATORY_CARE_PROVIDER_SITE_OTHER): Payer: PPO | Admitting: *Deleted

## 2021-09-03 DIAGNOSIS — Z5181 Encounter for therapeutic drug level monitoring: Secondary | ICD-10-CM

## 2021-09-03 LAB — POCT INR: INR: 2.7 (ref 2.0–3.0)

## 2021-09-03 NOTE — Patient Instructions (Signed)
Description   ?Continue taking 1 tablet daily except 1/2 tablet on Sundays and Thursdays. Recheck INR in 4 weeks. Call [phone] call with any new medications or if scheduled for any other procedures. Clearance/Procedure Fax #[phone] ?  ?  ?

## 2021-09-14 DIAGNOSIS — E7800 Pure hypercholesterolemia, unspecified: Secondary | ICD-10-CM | POA: Diagnosis not present

## 2021-09-14 DIAGNOSIS — I119 Hypertensive heart disease without heart failure: Secondary | ICD-10-CM | POA: Diagnosis not present

## 2021-09-14 DIAGNOSIS — E039 Hypothyroidism, unspecified: Secondary | ICD-10-CM | POA: Diagnosis not present

## 2021-09-18 DIAGNOSIS — N390 Urinary tract infection, site not specified: Secondary | ICD-10-CM | POA: Diagnosis not present

## 2021-09-19 ENCOUNTER — Ambulatory Visit (INDEPENDENT_AMBULATORY_CARE_PROVIDER_SITE_OTHER): Payer: PPO

## 2021-09-19 DIAGNOSIS — I495 Sick sinus syndrome: Secondary | ICD-10-CM

## 2021-09-19 LAB — CUP PACEART REMOTE DEVICE CHECK
Battery Impedance: 1924 Ohm
Battery Remaining Longevity: 40 mo
Battery Voltage: 2.75 V
Brady Statistic RV Percent Paced: 66 %
Date Time Interrogation Session: 20230505085655
Implantable Lead Implant Date: 20060326
Implantable Lead Implant Date: 20060326
Implantable Lead Location: 753859
Implantable Lead Location: 753860
Implantable Lead Model: 5076
Implantable Lead Model: 5076
Implantable Pulse Generator Implant Date: 20120817
Lead Channel Impedance Value: 421 Ohm
Lead Channel Impedance Value: 67 Ohm
Lead Channel Pacing Threshold Amplitude: 0.625 V
Lead Channel Pacing Threshold Pulse Width: 0.4 ms
Lead Channel Setting Pacing Amplitude: 2.5 V
Lead Channel Setting Pacing Pulse Width: 0.4 ms
Lead Channel Setting Sensing Sensitivity: 5.6 mV

## 2021-09-20 ENCOUNTER — Other Ambulatory Visit: Payer: Self-pay | Admitting: Internal Medicine

## 2021-09-20 ENCOUNTER — Other Ambulatory Visit: Payer: Self-pay | Admitting: Neurology

## 2021-09-20 DIAGNOSIS — G20 Parkinson's disease: Secondary | ICD-10-CM

## 2021-09-25 ENCOUNTER — Telehealth: Payer: Self-pay | Admitting: Physical Therapy

## 2021-09-25 DIAGNOSIS — G20 Parkinson's disease: Secondary | ICD-10-CM

## 2021-09-25 NOTE — Telephone Encounter (Signed)
Dr. Carles Collet, ? ?Smauel Horacek is scheduled for  OT, PT, and ST PD re-evaluations on 10/20/21 as recommended when patient was last discharged from therapy due to progressive nature of diagnosis of PD.   ? ?Pt was in agreement with this plan.  If you are in agreement, please send updated order for OT, PT, and ST via epic. ? ?Janann August, PT, DPT ?09/25/21 11:40 AM  ? ?

## 2021-09-29 ENCOUNTER — Ambulatory Visit (INDEPENDENT_AMBULATORY_CARE_PROVIDER_SITE_OTHER): Payer: PPO

## 2021-09-29 DIAGNOSIS — Z5181 Encounter for therapeutic drug level monitoring: Secondary | ICD-10-CM

## 2021-09-29 DIAGNOSIS — Z952 Presence of prosthetic heart valve: Secondary | ICD-10-CM

## 2021-09-29 LAB — POCT INR: INR: 3.1 — AB (ref 2.0–3.0)

## 2021-09-29 NOTE — Patient Instructions (Signed)
Description   ?Continue taking 1 tablet daily except 1/2 tablet on Sundays and Thursdays. Recheck INR in 5 weeks. Call [phone] call with any new medications or if scheduled for any other procedures. Clearance/Procedure Fax #[phone] ?  ?   ?

## 2021-10-01 ENCOUNTER — Other Ambulatory Visit: Payer: Self-pay | Admitting: Neurology

## 2021-10-01 ENCOUNTER — Other Ambulatory Visit: Payer: Self-pay | Admitting: Cardiology

## 2021-10-01 DIAGNOSIS — G20 Parkinson's disease: Secondary | ICD-10-CM

## 2021-10-01 DIAGNOSIS — I4821 Permanent atrial fibrillation: Secondary | ICD-10-CM

## 2021-10-02 NOTE — Progress Notes (Signed)
Remote pacemaker transmission.   

## 2021-10-03 ENCOUNTER — Ambulatory Visit: Payer: PPO | Admitting: Neurology

## 2021-10-07 ENCOUNTER — Telehealth: Payer: Self-pay | Admitting: Neurology

## 2021-10-07 DIAGNOSIS — G20 Parkinson's disease: Secondary | ICD-10-CM

## 2021-10-07 MED ORDER — CARBIDOPA-LEVODOPA 25-100 MG PO TABS: ORAL_TABLET | ORAL | 0 refills | Status: DC

## 2021-10-07 NOTE — Telephone Encounter (Signed)
90 day has been sent to [facility]. Called and left a detailed messaged on patients home phone per DPR that his Rx was sent to [facility] and to give me a call with any questions or concerns.

## 2021-10-07 NOTE — Telephone Encounter (Signed)
Patient's wife called and left a voice mail stating the patient will be out of his carbidopa levodopa 25-100 after today.  I tried to return the call but their phone was busy.

## 2021-10-15 DIAGNOSIS — E7800 Pure hypercholesterolemia, unspecified: Secondary | ICD-10-CM | POA: Diagnosis not present

## 2021-10-15 DIAGNOSIS — I119 Hypertensive heart disease without heart failure: Secondary | ICD-10-CM | POA: Diagnosis not present

## 2021-10-15 DIAGNOSIS — E039 Hypothyroidism, unspecified: Secondary | ICD-10-CM | POA: Diagnosis not present

## 2021-10-20 ENCOUNTER — Encounter: Payer: PPO | Admitting: Speech Pathology

## 2021-10-20 ENCOUNTER — Ambulatory Visit: Payer: PPO | Attending: Neurology | Admitting: Physical Therapy

## 2021-10-20 ENCOUNTER — Encounter: Payer: Self-pay | Admitting: Physical Therapy

## 2021-10-20 ENCOUNTER — Encounter: Payer: PPO | Admitting: Occupational Therapy

## 2021-10-20 DIAGNOSIS — R2681 Unsteadiness on feet: Secondary | ICD-10-CM | POA: Diagnosis not present

## 2021-10-20 DIAGNOSIS — M6281 Muscle weakness (generalized): Secondary | ICD-10-CM | POA: Diagnosis not present

## 2021-10-20 DIAGNOSIS — R2689 Other abnormalities of gait and mobility: Secondary | ICD-10-CM | POA: Diagnosis not present

## 2021-10-20 DIAGNOSIS — Z9181 History of falling: Secondary | ICD-10-CM | POA: Insufficient documentation

## 2021-10-20 DIAGNOSIS — G20 Parkinson's disease: Secondary | ICD-10-CM | POA: Diagnosis not present

## 2021-10-20 DIAGNOSIS — R29818 Other symptoms and signs involving the nervous system: Secondary | ICD-10-CM | POA: Diagnosis not present

## 2021-10-20 DIAGNOSIS — R293 Abnormal posture: Secondary | ICD-10-CM | POA: Diagnosis not present

## 2021-10-20 NOTE — Therapy (Signed)
OUTPATIENT PHYSICAL THERAPY NEURO EVALUATION   Patient Name: Donald [name] MRN: [id number] DOB:1941/04/22, 81 y.o., male Today's Date: 10/20/2021   PCP: Donald Pao, MD REFERRING PROVIDER: Ludwig Clarks, DO   PT End of Session - 10/20/21 [phone]     Visit Number 1    Number of Visits 17    Date for PT Re-Evaluation 01/18/22   due to potential delay in scheduling   Authorization Type HTA    PT Start Time 0931    PT Stop Time 1014    PT Time Calculation (min) 43 min    Equipment Utilized During Treatment Gait belt    Activity Tolerance Patient tolerated treatment well    Behavior During Therapy [name] for tasks assessed/performed;Impulsive             Past Medical History:  Diagnosis Date   Abnormality of gait 08/27/2014   Anxiety    Aortic stenosis    s/p AVR by Dr Donald [name]   Arthritis    left wrist- probable- not diagonised   Asthma    in the past   CAD (coronary artery disease)    s/p CABG 2006   GERD (gastroesophageal reflux disease)    H/O seasonal allergies    H/O: rheumatic fever    Hearing deficit    Bilateral hearing aids   Hematuria    had CT scan   Hiatal hernia    History of kidney stones    HOH (hard of hearing)    Hyperlipidemia    Hypertension    Hyperthyroidism    Inguinal hernia    right side; watching at this time per wife's report   Memory difficulty 08/27/2014   Pacemaker    Parkinson's disease    Persistent atrial fibrillation (Donald [name])    Scarlet fever    as a child   Shortness of breath    Sick sinus syndrome ([place])    s/p PPM (MDT) 2006   Past Surgical History:  Procedure Laterality Date   AORTIC VALVE REPLACEMENT  2006   CARDIOVERSION N/A 09/14/2013   Procedure: CARDIOVERSION;  Surgeon: Donald Klein, MD;  Location: [facility];  Service: Cardiovascular;  Laterality: N/A;   CHOLECYSTECTOMY N/A 07/21/2013   Procedure: LAPAROSCOPIC CHOLECYSTECTOMY;  Surgeon: Donald Jolly, MD;  Location: [facility];  Service: General;  Laterality:  N/A;   COLONOSCOPY W/ POLYPECTOMY     COLONOSCOPY WITH PROPOFOL N/A 08/31/2016   Procedure: COLONOSCOPY WITH PROPOFOL;  Surgeon: Donald Corner, MD;  Location: [name];  Service: Endoscopy;  Laterality: N/A;   CORONARY ARTERY BYPASS GRAFT  2006   DENTAL SURGERY     implants   INSERT / REPLACE / REMOVE PACEMAKER  2006, 2012   MDT for sick sinus syndrome, gen change 8/12 by Donald [name]   Patient Active Problem List   Diagnosis Date Noted   HOH (hard of hearing) 12/18/2020   Personal history of colonic polyps 08/31/2016   Abnormality of gait 08/27/2014   Memory difficulty 08/27/2014   Permanent atrial fibrillation ([place]) 09/08/2013   Cholelithiases 07/19/2013   Cholelithiasis 07/19/2013   Encounter for therapeutic drug monitoring 07/03/2013   Mechanical aortic valve 04/05/2013   Parkinson disease ([place]) 09/27/2012   Orthostatic hypotension 04/01/2012   Pacemaker-Medtronic 03/29/2012   HEPATITIS A 06/04/2010   HYPERTHYROIDISM 06/04/2010   HYPERLIPIDEMIA 06/04/2010   Essential hypertension 06/04/2010   Coronary artery disease involving native coronary artery of native heart without angina pectoris 06/04/2010  SICK SINUS SYNDROME 06/04/2010   HIATAL HERNIA 06/04/2010   SHORTNESS OF BREATH 06/04/2010   CHEST PAIN 06/04/2010    ONSET DATE: 09/25/2021  REFERRING DIAG: G20 (ICD-10-CM) - Parkinson's disease ([place])  THERAPY DIAG:  Abnormal posture  Other abnormalities of gait and mobility  Other symptoms and signs involving the nervous system  Unsteadiness on feet  Muscle weakness (generalized)  History of falling  Rationale for Evaluation and Treatment Rehabilitation  SUBJECTIVE:                                                                                                                                                                                              SUBJECTIVE STATEMENT: Has been having a lot of falls, tendency to fall on his R  hip and backwards. Pt's wife reports that he is still walking on his toes. Had a UTI in May and was falling a bunch back then. Is still doing [employer]. Doing some of his PT exercises from last time. Has been using his U-step walker more at home since Wife thinks he falls when he turns loose from his walker, another one was when he was standing at the sink, and another one was when he was stepping backwards after getting something from the refrigerator.   Wants to just focus on PT at this time instead of ST and OT.  Pt accompanied by: significant other Wife Donald [name]  PERTINENT HISTORY: PD (diagnosed in 2012), CAD (s/p CABG 2006), HTN, a fib, pacemaker, GERD, on anticoagulants   PAIN:  Are you having pain? No  PRECAUTIONS: Fall, ICD/Pacemaker, and Other: Wears a helmet, elbow and knees pads to help with falling, pt very HOH  FALLS: Has patient fallen in last 6 months? Yes. Number of falls 13+   LIVING ENVIRONMENT: Lives with: lives with their family Lives in: House/apartment Stairs: No Has following equipment at home: Gilford Rile - 2 wheeled, Grab bars, Ramped entry, and has a walk-in shower, has a U-Step walker at home.   PLOF: Independent with basic ADLs, Independent with household mobility with device, and Leisure: being outside in the yard, watches TV  PATIENT GOALS Pt and pt's wife want to work on pt getting off of his toes when walking and taking bigger steps.   OBJECTIVE:    COGNITION: Overall cognitive status: History of cognitive impairments - at baseline   SENSATION: WFL  COORDINATION: Heel to shin: slower to perform with LLE      POSTURE: rounded shoulders and forward head  LOWER EXTREMITY AROM:   Decr L hamstring ROM compared to R.   LOWER EXTREMITY MMT:  MMT Right Eval Left Eval  Hip flexion 5 5  Hip extension    Hip abduction    Hip adduction    Hip internal rotation    Hip external rotation    Knee flexion 5 5  Knee extension 5 5  Ankle  dorsiflexion 4+ 4+  Ankle plantarflexion    Ankle inversion    Ankle eversion    (Blank rows = not tested)  BED MOBILITY:  Pt's spouse reports that she has to help pt with getting out of the bed.  TRANSFERS: Assistive device utilized: Environmental consultant - 2 wheeled  Sit to stand: SBA Stand to sit: SBA With BUE support from chair, pt with decr anterior lean at times, but no bracing against chair.   Pt needing cues to turn all the way around with RW before sitting down to chair.     GAIT: Gait pattern: step to pattern, step through pattern, decreased step length- Right, decreased step length- Left, decreased stride length, decreased hip/knee flexion- Right, decreased hip/knee flexion- Left, decreased ankle dorsiflexion- Right, decreased ankle dorsiflexion- Left, knee flexed in stance- Right, knee flexed in stance- Left, shuffling, poor foot clearance- Right, and poor foot clearance- Left Distance walked: Clinic distances plus 230' with  Assistive device utilized: Walker - 2 wheeled Level of assistance: CGA Comments: Pt with decr stride length and more shuffled steps esp when turning. Pt ambulates on bilat toes vs. Getting whole foot on the ground when ambulating with heel strike  FUNCTIONAL TESTs:  5 times sit to stand: 19.62 seconds Timed up and go (TUG): 40.84 seconds with RW, pt taking incr time to turn 3 minute walk test: 235' with RW 10 meter walk test: 30.28 seconds with RW = 1.08 ft/sec    TODAY'S TREATMENT:  N/A during eval.    PATIENT EDUCATION: Education details: Clinical findings, POC, areas to work on during this bout of therapy, pt would like to focus just on PT at this time instead of ST and OT Person educated: Patient and Spouse Education method: Explanation Education comprehension: verbalized understanding   HOME EXERCISE PROGRAM: Will review from previous bout of therapy and update as needed.     GOALS: Goals reviewed with patient? Yes  SHORT TERM GOALS:  Target date: 11/17/2021  Pt will perform initial HEP with supervision from pt's spouse for strength, balance, and transfers in order to build upon functional gains made in therapy. Baseline: Will need review from previous HEP Goal status: INITIAL  2.  Pt and pt's spouse will verbalize understanding of techniques to help with freezing/festination episodes in order to demo improved safety with gait. Baseline: Will need further review.  Goal status: INITIAL  3.  Pt will improve gait speed with RW vs. U step rollator to at least 1.3 ft/sec in order to demo improved community mobility.   Baseline: 30.28 seconds with RW = 1.08 ft/sec Goal status: INITIAL  4.  Pt will improve TUG time to 34 seconds or less with RW vs. U-step walker in order to demo decrease fall risk, improved step length and improved ability to turn  Baseline: 40.84 seconds with RW, pt taking incr time to turn Goal status: INITIAL    LONG TERM GOALS: Target date: 12/15/2021   Pt will perform final HEP with supervision from pt's spouse for strength, balance, and transfers in order to build upon functional gains made in therapy Baseline:  Goal status: INITIAL  2.  Pt will perform 5x sit <> stand in 17.5  seconds or less with UE support with proper technique and no BLE bracing in order to demo improved functional transfers and decr fall risk.  Baseline: 19.62 seconds Goal status: INITIAL  3.  Pt will improve gait speed with RW vs. U step rollator to at least 1.8 ft/sec in order to demo improved community mobility.  Baseline:  30.28 seconds with RW = 1.08 ft/sec Goal status: INITIAL  4.  Pt will improve 3MWT distance to at least 280' in order to demo improved gait efficiency and taking larger stride length in order to demo improved community/household mobility.  Baseline: 11' with RW Goal status: INITIAL  5.   Pt and pt's spouse will verbalize understanding of fall prevention in the home.  Baseline:  Goal status:  INITIAL  6.  Pt will improve TUG time to 29 seconds or less with RW vs. U-step walker in order to demo decrease fall risk, improved step length and improved ability to turn Baseline: 40.84 seconds with RW, pt taking incr time to turn Goal status: INITIAL  ASSESSMENT:  CLINICAL IMPRESSION: Patient is a 81 year old male referred to Neuro OPPT for return PD 6 month eval who is well known to this clinic from previous bout of therapy.   Pt's PMH is significant for: PD, CAD (s/p CABG 2006), HTN, a fib, pacemaker, GERD, on anticoagulants. Pt uses a RW and a U-Step Walker at home. Pt has had multiple falls in the past few months- with tendency to fall posteriorly and to the R. Pt's spouse reports that he walks on his toes at home still.   The following deficits were present during the exam: decr timing/coordination of gait, abnormal posture, impaired balance, gait abnormalities (festination/freezing and shuffled steps), decr functional strength for transfers, decr safety awareness, bradykinesia, impulsivity. Based on TUG, 5x sit <> stand, and gait speed pt is an incr risk for falls. Pt would benefit from skilled PT to address these impairments and functional limitations to maximize functional mobility independence and decr fall risk.     OBJECTIVE IMPAIRMENTS Abnormal gait, decreased activity tolerance, decreased balance, decreased coordination, decreased endurance, decreased knowledge of use of DME, decreased ROM, decreased strength, decreased safety awareness, impaired flexibility, and postural dysfunction.   ACTIVITY LIMITATIONS carrying, lifting, bending, standing, stairs, transfers, bed mobility, reach over head, and locomotion level  PARTICIPATION LIMITATIONS: cleaning, laundry, medication management, community activity, and yard work  PERSONAL FACTORS Age, Behavior pattern, Past/current experiences, Time since onset of injury/illness/exacerbation, and 3+ comorbidities: PD (diagnosed in 2012), CAD  (s/p CABG 2006), HTN, a fib, pacemaker, GERD, on anticoagulants   are also affecting patient's functional outcome.   REHAB POTENTIAL: Good  CLINICAL DECISION MAKING: Evolving/moderate complexity  EVALUATION COMPLEXITY: Moderate  PLAN: PT FREQUENCY: 2x/week  PT DURATION: 12 weeks  PLANNED INTERVENTIONS: Therapeutic exercises, Therapeutic activity, Neuromuscular re-education, Balance training, Gait training, Patient/Family education, and DME instructions  PLAN FOR NEXT SESSION: Review previous HEP from last bout of therapy and update as needed. Work on Personnel officer with RW vs. U Step walker  - focus on incr stride length (try either counting steps or try with laser again)    Arliss Journey, PT, DPT  10/20/2021, 12:18 PM

## 2021-10-21 NOTE — Progress Notes (Signed)
Assessment/Plan:   1.  Parkinsons Disease  -continue carbidopa/levodopa 25/100, 2 tablets @ 7am / 9:30 am/ 12 pm /2:30 pm /5 pm and 7:30 pm   -Continue carbidopa/levodopa 50/200 CR at bedtime  -continue Ropinirole, 2 mg 3 times per day.  We decreased this because of hallucinations.  Wife does not think that this contributed to the recent falls.  She stated that we decreased this many months ago and while he had a few random falls initially, he had none for months and then had a lot recently, some of which may have been related to urinary tract infection.  However, if he does not get better after physical therapy, we agreed that we may try to increase this again.  -has just gotten started with PT and this is likely the only thing that will help with balance.  He turn en bloc some.    -handicap placard signed  -goes to [facility] two days per week  2.  Memory change  -May have PDD, but difficult to tell given the degree of hearing loss he has.  We have fully taken him off of entacapone.  We are slightly weaning the ropinirole and can address this in the future as well.   3.  Dysphagia  -pt had mbe on 10/26 with mod oropharyngeal dysphagia.  Felt to be severe aspiration risk.    4.  Sialorrhea  -This is commonly associated with PD.  We talked about treatments.  The patient is not a candidate for oral anticholinergic therapy because of increased risk of confusion and falls.  We discussed Botox (type A and B) and 1% atropine drops.  We discusssed that candy like lemon drops can help by stimulating mm of the oropharynx to induce swallowing.  5.  Low BP  -send dr. Anne Fu a note re: bp and metoprolol.  His wife worries that this may be contributing to falls, although he reports he really is not having any dizziness.     Subjective:   Donald [name] was seen today in follow up for Parkinsons disease.  My previous records were reviewed prior to todays visit as well as outside records available to me.  Pt with wife who supplements the history.   Last visit, we decreased the ropinirole from 3 mg 3 times per day to 2 mg 3 times per day.  His wife did call me and stated that he had falls, but the falls started before we started decreasing the Requip.  Physical therapy was offered.  He just started this physical therapy last week (has only had a consult).  They report that he has had a good number of falls, some related to a urinary tract infection last month.  He has had 15 falls since mid April.  He falls with the walker and sometimes he lets go of the walker and falls.  Yesterday, he fell outside.  Always falls to the R hand side and to the back.  No hallucinations but wife states states that he may wake up and think that a dream is real.  Some drooling  Current prescribed movement disorder medications: carbidopa/levodopa 25/100, 2 tablets @ 7am / 9:30 am/ 12 pm /2:30 pm /5 pm and 7:30 pm Carbidopa/levodopa 50/200 CR at bedtime Ropinirole, 2mg  3 times per day (from 3 mg 3 times per day)  Prior medications: Carbidopa/levodopa 25/250; entacapone  ALLERGIES:   Allergies  Allergen Reactions   Sulfa Antibiotics     unknown    CURRENT  MEDICATIONS:  Outpatient Encounter Medications as of 10/24/2021  Medication Sig   amoxicillin (AMOXIL) 500 MG capsule Take 4 capsules by mouth as directed. Prior to dental appts   aspirin 81 MG tablet Take 81 mg by mouth daily.   atorvastatin (LIPITOR) 20 MG tablet TAKE ONE TABLET BY MOUTH EVERYDAY AT BEDTIME   carbidopa-levodopa (SINEMET CR) 50-200 MG tablet TAKE ONE TABLET BY MOUTH EVERYDAY AT BEDTIME   carbidopa-levodopa (SINEMET IR) 25-100 MG tablet TAKE TWO TABLETS BY MOUTH SIX TIMES DAILY   esomeprazole (NEXIUM) 20 MG capsule Take 40 mg by mouth daily.   levothyroxine (SYNTHROID, LEVOTHROID) 125 MCG tablet Take 125 mcg by mouth daily.   metoprolol succinate (TOPROL-XL) 25 MG 24 hr tablet TAKE ONE TABLET BY MOUTH EVERYDAY AT BEDTIME   nitroGLYCERIN (NITROSTAT)  0.4 MG SL tablet PLACE 1 TABLET UNDER TONGUE EVERY 5 MINUTES FOR 3 DOSES AS NEEDED FOR CHEST PAIN   rOPINIRole (REQUIP) 2 MG tablet 2 mg at 7am, 2 mg at noon, 2 mg at 5pm   warfarin (COUMADIN) 5 MG tablet TAKE 1/2 TO 1 TABLET BY MOUTH DAILY OR AS DIRECTED by coumadin clinic   No facility-administered encounter medications on file as of 10/24/2021.    Objective:   PHYSICAL EXAMINATION:    VITALS:   Vitals:   10/24/21 0851  BP: (!) 100/50  Pulse: 71  SpO2: 98%  Weight: 158 lb (71.7 kg)      GEN:  The patient appears stated age and is in NAD. HEENT:  Normocephalic, atraumatic.  Pt in helmet.  The mucous membranes are moist. The superficial temporal arteries are without ropiness or tenderness. CV:  RRR Lungs:  CTAB Neck/HEME:  There are no carotid bruits bilaterally.  Neurological examination:  Orientation: The patient is alert and oriented x3. Cranial nerves: There is good facial symmetry with facial hypomimia. The speech is fluent and clear. Soft palate rises symmetrically and there is no tongue deviation. Hearing is markedly decreased to conversational tone. Sensation: Sensation is intact to light touch throughout Motor: Strength is at least antigravity x4.  Movement examination: Tone: There is normal tone in the bilateral upper extremities.  The tone in the lower extremities is normal.  Abnormal movements: There is no tremor today.  He has just mild dyskinesia today, mostly in the fingers on the right. Coordination:  There is mild decremation with RAM's, in the left hemibody Gait and Station: The patient pushes off of the chair to arise.  He is given his walker.  He is initially short stepped, but once he gets out in the hall he actually walks very well.  He turns en bloc.    I have reviewed and interpreted the following labs independently    Chemistry      Component Value Date/Time   NA 139 07/01/2021 1150   K 4.5 07/01/2021 1150   CL 102 07/01/2021 1150   CO2 25  07/01/2021 1150   BUN 13 07/01/2021 1150   CREATININE 0.62 (L) 07/01/2021 1150      Component Value Date/Time   CALCIUM 9.3 07/01/2021 1150   ALKPHOS 174 (H) 07/01/2021 1150   AST 15 07/01/2021 1150   ALT 7 07/01/2021 1150   BILITOT 1.4 (H) 07/01/2021 1150       Lab Results  Component Value Date   WBC 4.8 07/01/2021   HGB 11.9 (L) 07/01/2021   HCT 36.3 (L) 07/01/2021   MCV 85 07/01/2021   PLT 235 07/01/2021    No  results found for: "TSH"  No results found for: "VITAMINB12"    Total time spent on today's visit was 33 minutes, including both face-to-face time and nonface-to-face time.  Time included that spent on review of records (prior notes available to me/labs/imaging if pertinent), discussing treatment and goals, answering patient's questions and coordinating care.  Cc:  Tisovec, Adelfa Koh, MD

## 2021-10-24 ENCOUNTER — Encounter: Payer: Self-pay | Admitting: Neurology

## 2021-10-24 ENCOUNTER — Telehealth: Payer: Self-pay | Admitting: *Deleted

## 2021-10-24 ENCOUNTER — Ambulatory Visit: Payer: PPO | Admitting: Neurology

## 2021-10-24 VITALS — BP 100/50 | HR 71 | Wt 158.0 lb

## 2021-10-24 DIAGNOSIS — G20 Parkinson's disease: Secondary | ICD-10-CM | POA: Diagnosis not present

## 2021-10-24 DIAGNOSIS — I959 Hypotension, unspecified: Secondary | ICD-10-CM

## 2021-10-24 DIAGNOSIS — G20A1 Parkinson's disease without dyskinesia, without mention of fluctuations: Secondary | ICD-10-CM

## 2021-10-24 DIAGNOSIS — R296 Repeated falls: Secondary | ICD-10-CM

## 2021-10-24 DIAGNOSIS — K117 Disturbances of salivary secretion: Secondary | ICD-10-CM

## 2021-10-24 NOTE — Telephone Encounter (Signed)
Pam,  Let's stop his metoprolol.  Thanks  Candee Furbish, MD         Spoke with [name] ([place] [facility]) and advised pt to d/c metoprolol.  She states understanding and will notify the pt.  They will call back if any questions or concerns.

## 2021-10-24 NOTE — Patient Instructions (Signed)
If falls no better after PT, we can consider increasing the ropinirole again I will send a note to Dr. Anne Fu about the metoprolol We can consider botox for the drooling if you want  Local and Online Resources for Power over Parkinson's Group June 2023  LOCAL [place] PARKINSON'S GROUPS  Power over Parkinson's Group:   Power Over Parkinson's Patient Education Group will be Wednesday, June 14th-*Hybrid meting*- in person at [facility] location and via [facility] at 2:00 pm.   Upcoming Power over [employer] Meetings:  2nd Wednesdays of the month at 2 pm:   June 14th, July 12th Contact Amy Marriott at [email or web address] if interested in participating in this group Parkinson's Care Partners Group:    3rd Mondays, Contact Misty Paladino Atypical Parkinsonian Patient Group:   4th Wednesdays, Contact Misty Paladino If you are interested in participating in these groups with Misty, please contact her directly for how to join those meetings.  Her contact information is [email or web address].    LOCAL EVENTS AND NEW OFFERINGS Dance Class for People with Parkinson's at [facility].  Friday, June 9th at 2 pm.  Nolen Mu by Sherrie Sport DPT students.  Contact [email or web address] to register or with questions. Ice Cream Social at [place]!  Thursday, June 15th, 5:30-7:00 pm.  RSVP to [email or web address] for attendance and free ice cream. Parkinson's T-shirts for sale!  Designed by a local group member, with funds going to Movement Disorders Fund.  $25.00  Technical sales engineer to purchase  [employer]! Moves [employer] Instructor-Led Class offering at [employer]!  Wednesdays 1-2 pm, starting April 12th.   Contact Irma Newness, Visual merchandiser at [employer].  [email or web address]  ONLINE EDUCATION AND SUPPORT Parkinson Foundation:  www.parkinson.org PD Health at Home continues:  Mindfulness Mondays, Wellness Wednesdays, Fitness Fridays  Upcoming Education: Parkinson's 101:   What You and Your Family Should Know.  Wednesday, June 7th at 1:00 pm Register for expert briefings (webinars) at [email or web address] Please check out their website to sign up for emails and see their full online offerings   [employer] Foundation:  www.[email or web address]  Third Thursday Webinars:  On the third Thursday of every month at 12 p.m. ET, join our free live webinars to learn about various aspects of living with Parkinson's disease and our work to speed medical breakthroughs. Upcoming Webinar: REPLAY:  From Low Blood Pressure to Bladder Problems:  A Look at Lesser Known Parkinson's Symptoms.  Thursday, June 15th at 12 noon. Check out additional information on their website to see their full online offerings  [facility]:  [email or web address] Upcoming Webinar:   Stay tuned Webinar Series:  Living with Parkinson's Meetup.   Third Thursdays each month, 3 pm Care Partner Monthly Meetup.  With Jillene Bucks [place].  First Tuesday of each month, 2 pm Check out additional information to Live Well Today on their website  Parkinson and Movement Disorders (PMD) Alliance:  www.pmdalliance.org [employer] Online:  Online Education Events Sign up for emails, which are sent weekly to give you updates on programming and online offerings  Parkinson's Association of the [place]:  www.parkinsonassociation.org Information on online support groups, education events, and online exercises including Yoga, Parkinson's exercises and more-LOTS of information on links to PD resources and online events Virtual Support Group through Parkinson's Association of the [place]; next one is scheduled for Wednesday, June 7th at 2 pm. (These are typically scheduled for the 1st Wednesday of the month at 2 pm).  Visit website for details. Save the date for "Caring for Parkinson's-Caring for You", 9th  Annual Symposium.  In-person  event in [place].  September 9th.  More info on registration to come. MOVEMENT AND EXERCISE OPPORTUNITIES PWR! Moves Classes at [facility] Exercise Room.  Wednesdays 10 and 11 am.   Contact Amy Marriott, PT [email or web address] if interested. NEW PWR! Moves Class offering at [employer].  Wednesdays 1-2 pm, starting April 12th.  Contact Irma Newness, Visual merchandiser at [employer].  [email or web address] Here is a link to the PWR!Moves classes on Zoom from [place] - Daily Mon-Sat at 10:00. Via Zoom, FREE and open to all.  There is also a link below via Facebook if you use that platform.  [email or web address].is [email or web address]  Parkinson's Wellness Recovery (PWR! Moves)  www.pwr4life.org Info on the PWR! Virtual Experience:  You will have access to our expertise through self-assessment, guided plans that start with the PD-specific fundamentals, educational content, tips, Q&A with an expert, and a growing Engineering geologist of PD-specific pre-recorded and live exercise classes of varying types and intensity - both physical and cognitive! If that is not enough, we offer 1:1 wellness consultations (in-person or virtual) to personalize your PWR! Dance movement psychotherapist.  Parkinson [employer] Fridays:  As part of the PD Health @ Home program, this free video series focuses each week on one aspect of fitness designed to support people living with Parkinson's.  These weekly videos highlight the Parkinson Foundation recent fitness guidelines for people with Parkinson's disease. MenusLocal.com.br Dance for PD website is offering free, live-stream classes throughout the week, as well as links to [employer] of classes:   [email or web address]/ Virtual dance and Pilates for Parkinson's classes: Click on the Community Tab> Parkinson's Movement Initiative Tab.  To register for classes and for more information, visit [email or web address] and click the "community" tab.  [employer] Parkinson's Cycling Classes  [employer]:  Thursdays @ Noon-Live classes at [employer] ([employer] at [email or web address] or [phone]) [employer]: Virtual Classes Mondays and Thursdays Fawn Kirk classes Tuesday, Wednesday and Thursday (contact [place] at [email or web address]  or ([phone]) [facility] Boxing Varied levels of classes are offered Tuesdays and Thursdays at [employer].  Stretching with Byrd Hesselbach weekly class is also offered for people with Parkinson's To observe a class or for more information, call [phone] or email Patricia Nettle at [email or web address] ADDITIONAL SUPPORT AND RESOURCES Well[employer]:Online Caregiver Education Opportunities:  www.well-springsolutions.org/caregiver-education/caregiver-support-group.  You may also contact Loleta Chance at [email or web address] or [phone].    Well-Spring Navigator:  [phone] program, a free service to help individuals and families through the journey of determining care for older adults.  The "Navigator" is a [employer], Child psychotherapist, who will speak with a prospective client and/or loved ones to provide an assessment of the situation and a set of recommendations for a personalized care plan -- all free of charge, and whether Well[employer]Spring [employer] offers the needed service or not. If the need is not a service we provide, we are well-connected with reputable programs in town that we can refer you to.  www.well-springsolutions.org or to speak with the Navigator, call [phone]. Family Caregiver Programming in June:  [employer], Thursday, June 15th 11-12:30 at June 17. [address].  Call [phone] to register

## 2021-10-27 DIAGNOSIS — Z961 Presence of intraocular lens: Secondary | ICD-10-CM | POA: Diagnosis not present

## 2021-10-27 DIAGNOSIS — H524 Presbyopia: Secondary | ICD-10-CM | POA: Diagnosis not present

## 2021-10-27 DIAGNOSIS — H04123 Dry eye syndrome of bilateral lacrimal glands: Secondary | ICD-10-CM | POA: Diagnosis not present

## 2021-10-28 ENCOUNTER — Telehealth: Payer: Self-pay | Admitting: Neurology

## 2021-10-28 NOTE — Telephone Encounter (Signed)
Pt's wife called in and they have decided they would like to try botox for the pt's drooling. She wanted to see about getting the process started.

## 2021-10-29 ENCOUNTER — Telehealth: Payer: Self-pay | Admitting: Pharmacy Technician

## 2021-10-29 ENCOUNTER — Encounter: Payer: Self-pay | Admitting: Physical Therapy

## 2021-10-29 ENCOUNTER — Ambulatory Visit: Payer: PPO | Admitting: Physical Therapy

## 2021-10-29 DIAGNOSIS — R2689 Other abnormalities of gait and mobility: Secondary | ICD-10-CM

## 2021-10-29 DIAGNOSIS — R293 Abnormal posture: Secondary | ICD-10-CM | POA: Diagnosis not present

## 2021-10-29 DIAGNOSIS — R29818 Other symptoms and signs involving the nervous system: Secondary | ICD-10-CM

## 2021-10-29 NOTE — Telephone Encounter (Signed)
Called wife and discussed frequency and the ordering procedure. They are interested in moving forward I have sent to the PA team to start that process for [place]

## 2021-10-29 NOTE — Therapy (Signed)
OUTPATIENT PHYSICAL THERAPY NEURO TREATMENT   Patient Name: Donald [name] MRN: [id number] DOB:11-May-1941, 81 y.o., male Today's Date: 10/29/2021   PCP: Haywood Pao, MD REFERRING PROVIDER: Ludwig Clarks, DO   PT End of Session - 10/29/21 0851     Visit Number 2    Number of Visits 17    Date for PT Re-Evaluation 01/18/22   due to potential delay in scheduling   Authorization Type HTA    PT Start Time 0847    PT Stop Time 0932    PT Time Calculation (min) 45 min    Equipment Utilized During Treatment Gait belt    Activity Tolerance Patient tolerated treatment well    Behavior During Therapy [facility] for tasks assessed/performed;Impulsive              Past Medical History:  Diagnosis Date   Abnormality of gait 08/27/2014   Anxiety    Aortic stenosis    s/p AVR by Dr Cyndia Bent   Arthritis    left wrist- probable- not diagonised   Asthma    in the past   CAD (coronary artery disease)    s/p CABG 2006   GERD (gastroesophageal reflux disease)    H/O seasonal allergies    H/O: rheumatic fever    Hearing deficit    Bilateral hearing aids   Hematuria    had CT scan   Hiatal hernia    History of kidney stones    HOH (hard of hearing)    Hyperlipidemia    Hypertension    Hyperthyroidism    Inguinal hernia    right side; watching at this time per wife's report   Memory difficulty 08/27/2014   Pacemaker    Parkinson's disease    Persistent atrial fibrillation ([place])    Scarlet fever    as a child   Shortness of breath    Sick sinus syndrome ([place])    s/p PPM (MDT) 2006   Past Surgical History:  Procedure Laterality Date   AORTIC VALVE REPLACEMENT  2006   CARDIOVERSION N/A 09/14/2013   Procedure: CARDIOVERSION;  Surgeon: Sanda Klein, MD;  Location: [place] [facility];  Service: Cardiovascular;  Laterality: N/A;   CHOLECYSTECTOMY N/A 07/21/2013   Procedure: LAPAROSCOPIC CHOLECYSTECTOMY;  Surgeon: Edward Jolly, MD;  Location: [place];  Service: General;   Laterality: N/A;   COLONOSCOPY W/ POLYPECTOMY     COLONOSCOPY WITH PROPOFOL N/A 08/31/2016   Procedure: COLONOSCOPY WITH PROPOFOL;  Surgeon: Wilford Corner, MD;  Location: [facility];  Service: Endoscopy;  Laterality: N/A;   CORONARY ARTERY BYPASS GRAFT  2006   DENTAL SURGERY     implants   INSERT / REPLACE / REMOVE PACEMAKER  2006, 2012   MDT for sick sinus syndrome, gen change 8/12 by [place]   Patient Active Problem List   Diagnosis Date Noted   HOH (hard of hearing) 12/18/2020   Personal history of colonic polyps 08/31/2016   Abnormality of gait 08/27/2014   Memory difficulty 08/27/2014   Permanent atrial fibrillation ([place]) 09/08/2013   Cholelithiases 07/19/2013   Cholelithiasis 07/19/2013   Encounter for therapeutic drug monitoring 07/03/2013   Mechanical aortic valve 04/05/2013   Parkinson disease ([place]) 09/27/2012   Orthostatic hypotension 04/01/2012   Pacemaker-Medtronic 03/29/2012   HEPATITIS A 06/04/2010   HYPERTHYROIDISM 06/04/2010   HYPERLIPIDEMIA 06/04/2010   Essential hypertension 06/04/2010   Coronary artery disease involving native coronary artery of native heart without angina pectoris  06/04/2010   SICK SINUS SYNDROME 06/04/2010   HIATAL HERNIA 06/04/2010   SHORTNESS OF BREATH 06/04/2010   CHEST PAIN 06/04/2010    ONSET DATE: 09/25/2021  REFERRING DIAG: G20 (ICD-10-CM) - Parkinson's disease ([place])  THERAPY DIAG:  Abnormal posture  Other abnormalities of gait and mobility  Other symptoms and signs involving the nervous system  Rationale for Evaluation and Treatment Rehabilitation  SUBJECTIVE:                                                                                                                                                                                              SUBJECTIVE STATEMENT: Has had 4 falls since he was last here. Unsure on how he fell, but was not holding onto his walker.   Wants to just focus on  PT at this time instead of ST and OT.  Pt accompanied by: significant other [name]  PERTINENT HISTORY: PD (diagnosed in 2012), CAD (s/p CABG 2006), HTN, a fib, pacemaker, GERD, on anticoagulants   PAIN:  Are you having pain? No  PRECAUTIONS: Fall, ICD/Pacemaker, and Other: Wears a helmet, elbow and knees pads to help with falling, pt very HOH  PLOF: Independent with basic ADLs, Independent with household mobility with device, and Leisure: being outside in the yard, watches TV  PATIENT GOALS Pt and pt's wife want to work on pt getting off of his toes when walking and taking bigger steps.     OBJECTIVE:   TODAY'S TREATMENT:    TRANSFERS: Assistive device utilized: Environmental consultant - 2 wheeled  Sit to stand: SBA Stand to sit: SBA With BUE support from chair, cues at times for incr forward lean and tall posture in standing, x10 reps, plus additional reps throughout session. Pt did well with proper technique.     GAIT: Gait pattern: step to pattern, step through pattern, decreased step length- Right, decreased step length- Left, decreased stride length, decreased hip/knee flexion- Right, decreased hip/knee flexion- Left, decreased ankle dorsiflexion- Right, decreased ankle dorsiflexion- Left, knee flexed in stance- Right, knee flexed in stance- Left, shuffling, poor foot clearance- Right, and poor foot clearance- Left Distance walked: Into and out of clinic, plus additional distances.  Assistive device utilized: Environmental consultant - 2 wheeled Level of assistance: CGA Comments: Pt continues to ambulate onto his toes vs. Getting foot flat on the ground. Needs verbal/visual/demo cues during session for BIG EFFORT when stepping to incr step length and getting more weight bearing through feet/heels. Pt able to take larger steps with LLE but tends to take smaller steps with RLE. Cued for marching when going to change direction and sit  down on the mat table.        NMR:  Alternating posterior stepping  at countertop with BUE support, performed massed practice with stepping back with RLE only (needing multi-modal cues), cues for proper step length and weight shift (trying to get back heel down as pt still on his toes), pt at times taking too large of a step and not able to get heel down, cued to hold for a couple seconds when weight shifting posteriorly. Will need to practice more at future sessions before adding to HEP.  Standing marching at RW with cued for posture, slowed pace and incr ROM when marching, x10 reps    Reviewed and updated pt's HEP from previous bout of therapy and provided new handout:   Access Code: [email or web address] URL: [email or web address] Date: 10/29/2021 Prepared by: Janann August  Seated PWR Up - cues for holding in PWR Up position for scap retraction/postural strengthening for 5 seconds, performed 10 reps.   Exercises - Side Stepping with Counter Support  - 2 x daily - 5 x weekly - 2 sets - 10 reps - performed as single step out and back to the midline, needs multi-modal cues for technique, once pt got technique cues for a BIG STOMP like he is smushing a bug, and then a big step back to the middle, performed 2 sets of 10 reps (pt needing a seated rest break in between due to fatigue), pt more challenged with stepping LLE back to midline.  - Sit to Stand with Armchair  - 1 x daily - 5 x weekly - 2 sets - 10 reps  - Alternating Step Forward with Support  - 2 x daily - 5 x weekly - 2 sets - 10 reps - standing at pt's RW, cues for BIG step and STOMP forward and holding position for 5 seconds into forward weight shift before stepping back to midline.      PATIENT EDUCATION: Education details: Revised/updated pt's HEP and provided new handout. Educated on purpose of exercises and importance of pt's spouse being there with pt when performing to give proper cues.  Person educated: Patient and Spouse Education method: Explanation Education comprehension:  verbalized understanding   HOME EXERCISE PROGRAM: Access Code: [email or web address] URL: [email or web address] Date: 10/29/2021 Prepared by: Janann August  Seated PWR Up  Exercises - Side Stepping with Counter Support  - 2 x daily - 5 x weekly - 2 sets - 10 reps - Sit to Stand with Armchair  - 1 x daily - 5 x weekly - 2 sets - 10 reps - Alternating Step Forward with Support  - 2 x daily - 5 x weekly - 2 sets - 10 reps    GOALS: Goals reviewed with patient? Yes  SHORT TERM GOALS: Target date: 11/17/2021  Pt will perform initial HEP with supervision from pt's spouse for strength, balance, and transfers in order to build upon functional gains made in therapy. Baseline: Will need review from previous HEP Goal status: INITIAL  2.  Pt and pt's spouse will verbalize understanding of techniques to help with freezing/festination episodes in order to demo improved safety with gait. Baseline: Will need further review.  Goal status: INITIAL  3.  Pt will improve gait speed with RW vs. U step rollator to at least 1.3 ft/sec in order to demo improved community mobility.   Baseline: 30.28 seconds with RW = 1.08 ft/sec Goal status: INITIAL  4.  Pt will improve TUG time to 34 seconds or less with RW  vs. U-step walker in order to demo decrease fall risk, improved step length and improved ability to turn  Baseline: 40.84 seconds with RW, pt taking incr time to turn Goal status: INITIAL    LONG TERM GOALS: Target date: 12/15/2021   Pt will perform final HEP with supervision from pt's spouse for strength, balance, and transfers in order to build upon functional gains made in therapy Baseline:  Goal status: INITIAL  2.  Pt will perform 5x sit <> stand in 17.5 seconds or less with UE support with proper technique and no BLE bracing in order to demo improved functional transfers and decr fall risk.  Baseline: 19.62 seconds Goal status: INITIAL  3.  Pt will improve gait speed with RW vs.  U step rollator to at least 1.8 ft/sec in order to demo improved community mobility.  Baseline:  30.28 seconds with RW = 1.08 ft/sec Goal status: INITIAL  4.  Pt will improve 3MWT distance to at least 280' in order to demo improved gait efficiency and taking larger stride length in order to demo improved community/household mobility.  Baseline: 23' with RW Goal status: INITIAL  5.   Pt and pt's spouse will verbalize understanding of fall prevention in the home.  Baseline:  Goal status: INITIAL  6.  Pt will improve TUG time to 29 seconds or less with RW vs. U-step walker in order to demo decrease fall risk, improved step length and improved ability to turn Baseline: 40.84 seconds with RW, pt taking incr time to turn Goal status: INITIAL  ASSESSMENT:  CLINICAL IMPRESSION: Today's skilled focused on reviewing prior HEP and updating as appropriate for posture, stepping and sit <> stands. Pt needs cues for a BIG STOMP with forward and lateral stepping strategies for incr amplitude of movement patterns. With forward/lateral stepping, pt with incr difficulty stepping LLE back to midline in one fluid step. Worked on posterior stepping as well, pt needing multi-modal cues and unable to perform correctly and does not get full weight on back leg (pt still on his toes). Will need more practice before adding to HEP. Will continue to progress towards LTGs.     OBJECTIVE IMPAIRMENTS Abnormal gait, decreased activity tolerance, decreased balance, decreased coordination, decreased endurance, decreased knowledge of use of DME, decreased ROM, decreased strength, decreased safety awareness, impaired flexibility, and postural dysfunction.   ACTIVITY LIMITATIONS carrying, lifting, bending, standing, stairs, transfers, bed mobility, reach over head, and locomotion level  PARTICIPATION LIMITATIONS: cleaning, laundry, medication management, community activity, and yard work  PERSONAL FACTORS Age, Behavior  pattern, Past/current experiences, Time since onset of injury/illness/exacerbation, and 3+ comorbidities: PD (diagnosed in 2012), CAD (s/p CABG 2006), HTN, a fib, pacemaker, GERD, on anticoagulants   are also affecting patient's functional outcome.   REHAB POTENTIAL: Good  CLINICAL DECISION MAKING: Evolving/moderate complexity  EVALUATION COMPLEXITY: Moderate  PLAN: PT FREQUENCY: 2x/week  PT DURATION: 12 weeks  PLANNED INTERVENTIONS: Therapeutic exercises, Therapeutic activity, Neuromuscular re-education, Balance training, Gait training, Patient/Family education, and DME instructions  PLAN FOR NEXT SESSION: Continue to work on stepping strategies, posterior weight shifting. Work on Personnel officer with RW vs. U Step walker  - focus on incr stride length (try either counting steps or try with laser again).    Arliss Journey, PT, DPT  10/29/2021, 10:44 AM

## 2021-10-30 ENCOUNTER — Other Ambulatory Visit (HOSPITAL_COMMUNITY): Payer: Self-pay

## 2021-10-30 NOTE — Telephone Encounter (Signed)
Patient Advocate Encounter   Received notification that prior authorization for Myobloc 5000UNIT/ML solution is required.   PA submitted on 10/30/2021 Key [id number] Status is pending       Roland Earl, CPhT Pharmacy Patient Advocate Specialist [facility] Patient Advocate Team Direct Number: [phone]  Fax: [phone]

## 2021-10-31 ENCOUNTER — Encounter: Payer: Self-pay | Admitting: Physical Therapy

## 2021-10-31 ENCOUNTER — Telehealth: Payer: Self-pay | Admitting: Neurology

## 2021-10-31 ENCOUNTER — Ambulatory Visit: Payer: PPO | Admitting: Physical Therapy

## 2021-10-31 ENCOUNTER — Other Ambulatory Visit (HOSPITAL_COMMUNITY): Payer: Self-pay

## 2021-10-31 DIAGNOSIS — R293 Abnormal posture: Secondary | ICD-10-CM | POA: Diagnosis not present

## 2021-10-31 DIAGNOSIS — R2689 Other abnormalities of gait and mobility: Secondary | ICD-10-CM

## 2021-10-31 DIAGNOSIS — R2681 Unsteadiness on feet: Secondary | ICD-10-CM

## 2021-10-31 DIAGNOSIS — R29818 Other symptoms and signs involving the nervous system: Secondary | ICD-10-CM

## 2021-10-31 NOTE — Telephone Encounter (Signed)
Pt is sch for 12-05-21 for the myobloc

## 2021-10-31 NOTE — Telephone Encounter (Signed)
Patient's wife called and said the patient's insurance company [employer], does not have a PA on file for the patient's myobloc.

## 2021-10-31 NOTE — Telephone Encounter (Signed)
Patients wife made aware and happy with the results

## 2021-10-31 NOTE — Telephone Encounter (Signed)
Did they give you a specialty pharmacy or is it buy and bill or am I missing it again?

## 2021-10-31 NOTE — Therapy (Signed)
OUTPATIENT PHYSICAL THERAPY NEURO TREATMENT   Patient Name: Donald [name] MRN: [id number] DOB:05-15-1941, 81 y.o., male Today's Date: 10/31/2021   PCP: Gaspar Garbe, MD REFERRING PROVIDER: Vladimir Faster, DO   PT End of Session - 10/31/21 1023     Visit Number 3    Number of Visits 17    Date for PT Re-Evaluation 01/18/22   due to potential delay in scheduling   Authorization Type HTA    PT Start Time 1019    PT Stop Time 1102    PT Time Calculation (min) 43 min    Equipment Utilized During Treatment Gait belt    Activity Tolerance Patient tolerated treatment well    Behavior During Therapy [facility] for tasks assessed/performed;Impulsive              Past Medical History:  Diagnosis Date   Abnormality of gait 08/27/2014   Anxiety    Aortic stenosis    s/p AVR by Dr Laneta Simmers   Arthritis    left wrist- probable- not diagonised   Asthma    in the past   CAD (coronary artery disease)    s/p CABG 2006   GERD (gastroesophageal reflux disease)    H/O seasonal allergies    H/O: rheumatic fever    Hearing deficit    Bilateral hearing aids   Hematuria    had CT scan   Hiatal hernia    History of kidney stones    HOH (hard of hearing)    Hyperlipidemia    Hypertension    Hyperthyroidism    Inguinal hernia    right side; watching at this time per wife's report   Memory difficulty 08/27/2014   Pacemaker    Parkinson's disease    Persistent atrial fibrillation (HCC)    Scarlet fever    as a child   Shortness of breath    Sick sinus syndrome (HCC)    s/p PPM (MDT) 2006   Past Surgical History:  Procedure Laterality Date   AORTIC VALVE REPLACEMENT  2006   CARDIOVERSION N/A 09/14/2013   Procedure: CARDIOVERSION;  Surgeon: Thurmon Fair, MD;  Location: [facility] ENDOSCOPY;  Service: Cardiovascular;  Laterality: N/A;   CHOLECYSTECTOMY N/A 07/21/2013   Procedure: LAPAROSCOPIC CHOLECYSTECTOMY;  Surgeon: Mariella Saa, MD;  Location: [facility] OR;  Service: General;   Laterality: N/A;   COLONOSCOPY W/ POLYPECTOMY     COLONOSCOPY WITH PROPOFOL N/A 08/31/2016   Procedure: COLONOSCOPY WITH PROPOFOL;  Surgeon: Charlott Rakes, MD;  Location: [facility] ENDOSCOPY;  Service: Endoscopy;  Laterality: N/A;   CORONARY ARTERY BYPASS GRAFT  2006   DENTAL SURGERY     implants   INSERT / REPLACE / REMOVE PACEMAKER  2006, 2012   MDT for sick sinus syndrome, gen change 8/12 by [facility]   KIDNEY STONE SURGERY  1984   Patient Active Problem List   Diagnosis Date Noted   HOH (hard of hearing) 12/18/2020   Personal history of colonic polyps 08/31/2016   Abnormality of gait 08/27/2014   Memory difficulty 08/27/2014   Permanent atrial fibrillation (HCC) 09/08/2013   Cholelithiases 07/19/2013   Cholelithiasis 07/19/2013   Encounter for therapeutic drug monitoring 07/03/2013   Mechanical aortic valve 04/05/2013   Parkinson disease (HCC) 09/27/2012   Orthostatic hypotension 04/01/2012   Pacemaker-[employer] 03/29/2012   HEPATITIS A 06/04/2010   HYPERTHYROIDISM 06/04/2010   HYPERLIPIDEMIA 06/04/2010   Essential hypertension 06/04/2010   Coronary artery disease involving native coronary artery of native heart without angina pectoris  06/04/2010   SICK SINUS SYNDROME 06/04/2010   HIATAL HERNIA 06/04/2010   SHORTNESS OF BREATH 06/04/2010   CHEST PAIN 06/04/2010    ONSET DATE: 09/25/2021  REFERRING DIAG: G20 (ICD-10-CM) - Parkinson's disease (HCC)  THERAPY DIAG:  Abnormal posture  Other abnormalities of gait and mobility  Other symptoms and signs involving the nervous system  Unsteadiness on feet  Rationale for Evaluation and Treatment Rehabilitation  SUBJECTIVE:                                                                                                                                                                                              SUBJECTIVE STATEMENT:  No falls since he was last here.    Pt accompanied by: significant other [name]  PERTINENT  HISTORY: PD (diagnosed in 2012), CAD (s/p CABG 2006), HTN, a fib, pacemaker, GERD, on anticoagulants   PAIN:  Are you having pain? No  PRECAUTIONS: Fall, ICD/Pacemaker, and Other: Wears a helmet, elbow and knees pads to help with falling, pt very HOH  PLOF: Independent with basic ADLs, Independent with household mobility with device, and Leisure: being outside in the yard, watches TV  PATIENT GOALS Pt and pt's wife want to work on pt getting off of his toes when walking and taking bigger steps.     OBJECTIVE:   TODAY'S TREATMENT:   Seated hamstring stretch 3 x 30 seconds bilat due to incr bilat knee flexion during gait, pt needing cues for proper technique and discussed that pt can put his foot on a step stool at home to make sure leg stays straight. Pt reporting feeling good from this stretch. Gave handout for pt to perform at home.   TRANSFERS: Assistive device utilized: Environmental consultant - 2 wheeled  Sit to stand: SBA Stand to sit: SBA With BUE support from chair/mat table throughout session, needs intermittent cues for incr forward lean for weight shifting to stand to prevent posterior lean.    GAIT: Gait pattern: step to pattern, step through pattern, decreased step length- Right, decreased step length- Left, decreased stride length, decreased hip/knee flexion- Right, decreased hip/knee flexion- Left, decreased ankle dorsiflexion- Right, decreased ankle dorsiflexion- Left, knee flexed in stance- Right, knee flexed in stance- Left, shuffling, poor foot clearance- Right, and poor foot clearance- Left Distance walked: Into and out of clinic with RW, 345' x 1, 115' x 2  Assistive device utilized: Environmental consultant - 2 wheeled Level of assistance: CGA  Comments:  When ambulating into and out of clinic with RW - pt continues to ambulate onto his toes vs. Getting foot flat on the ground. Needs verbal/visual/demo  cues during session for BIG EFFORT when stepping to incr step length and getting more weight  bearing through feet/heels. Pt able to take larger steps with LLE but tends to take smaller steps with RLE.   Used U-Step Rollator with use of laser for gait training with taking longer stride lengths for a visual cue (pt has a U-Step at home, but it does not have the laser feature). Adjusted rollator to proper height and showed the correct height as pt's spouse reports that his may be too tall at home. Cued for stepping to the laser with each step to stay closer to rollator and take longer strides, needs cues throughout but pt able to take longer strides with LLE>RLE. Attempted to perform without the laser (since pt does not have one at home), with cues to take steps and keeping FEET UNDER THE SEAT for longer stride length/staying closer to the walker. Pt did not do as well as this cue compared to visual cue of laser. Trialed a laser box and attached it to U-Step (as this could be something that they could order from [facility] and could get therapist to attach it to pt's U-step walker if pt's wife brings it in as this would be much cheaper than trying to get U-Step Rollator attachment). Pt continued to respond better to visual cue of laser and pt verbalizes that it works well for him. Pt still with decr stride length at times and not getting heel strike, but overall better than with no visual cue. Pt's spouse to work on ordering one.     PATIENT EDUCATION: Education details: Hamstring stretch added to HEP, See gait section.  Person educated: Patient and Spouse Education method: Explanation Education comprehension: verbalized understanding   HOME EXERCISE PROGRAM: Access Code: [email or web address]    GOALS: Goals reviewed with patient? Yes  SHORT TERM GOALS: Target date: 11/17/2021  Pt will perform initial HEP with supervision from pt's spouse for strength, balance, and transfers in order to build upon functional gains made in therapy. Baseline: Will need review from previous HEP Goal status: INITIAL  2.   Pt and pt's spouse will verbalize understanding of techniques to help with freezing/festination episodes in order to demo improved safety with gait. Baseline: Will need further review.  Goal status: INITIAL  3.  Pt will improve gait speed with RW vs. U step rollator to at least 1.3 ft/sec in order to demo improved community mobility.   Baseline: 30.28 seconds with RW = 1.08 ft/sec Goal status: INITIAL  4.  Pt will improve TUG time to 34 seconds or less with RW vs. U-step walker in order to demo decrease fall risk, improved step length and improved ability to turn  Baseline: 40.84 seconds with RW, pt taking incr time to turn Goal status: INITIAL    LONG TERM GOALS: Target date: 12/15/2021   Pt will perform final HEP with supervision from pt's spouse for strength, balance, and transfers in order to build upon functional gains made in therapy Baseline:  Goal status: INITIAL  2.  Pt will perform 5x sit <> stand in 17.5 seconds or less with UE support with proper technique and no BLE bracing in order to demo improved functional transfers and decr fall risk.  Baseline: 19.62 seconds Goal status: INITIAL  3.  Pt will improve gait speed with RW vs. U step rollator to at least 1.8 ft/sec in order to demo improved community mobility.  Baseline:  30.28 seconds with RW = 1.08 ft/sec Goal  status: INITIAL  4.  Pt will improve distance to at least 280' in order to demo improved gait efficiency and taking larger stride length in order to demo improved community/household mobility.  Baseline: 33' with RW Goal status: INITIAL  5.   Pt and pt's spouse will verbalize understanding of fall prevention in the home.  Baseline:  Goal status: INITIAL  6.  Pt will improve TUG time to 29 seconds or less with RW vs. U-step walker in order to demo decrease fall risk, improved step length and improved ability to turn Baseline: 40.84 seconds with RW, pt taking incr time to turn Goal status:  INITIAL  ASSESSMENT:  CLINICAL IMPRESSION: Added seated hamstring stretch to pt's HEP for pt to perform daily due to limited ROM. Pt has a U-Step Rollator at home and worked on Investment banker, operational with use of laser as visual cue to improve stride length. Pt did better with a visual cue vs. Verbal cues to keep feet under the seat and to take longer strides. Pt's wife to work on ordering a laser from [place] that can be attached to pt's U-Step Rollator (will be much cheapter than having it added by U-Step). Will continue to progress towards LTGs.     OBJECTIVE IMPAIRMENTS Abnormal gait, decreased activity tolerance, decreased balance, decreased coordination, decreased endurance, decreased knowledge of use of DME, decreased ROM, decreased strength, decreased safety awareness, impaired flexibility, and postural dysfunction.   ACTIVITY LIMITATIONS carrying, lifting, bending, standing, stairs, transfers, bed mobility, reach over head, and locomotion level  PARTICIPATION LIMITATIONS: cleaning, laundry, medication management, community activity, and yard work  PERSONAL FACTORS Age, Behavior pattern, Past/current experiences, Time since onset of injury/illness/exacerbation, and 3+ comorbidities: PD (diagnosed in 2012), CAD (s/p CABG 2006), HTN, a fib, pacemaker, GERD, on anticoagulants   are also affecting patient's functional outcome.   REHAB POTENTIAL: Good  CLINICAL DECISION MAKING: Evolving/moderate complexity  EVALUATION COMPLEXITY: Moderate  PLAN: PT FREQUENCY: 2x/week  PT DURATION: 12 weeks  PLANNED INTERVENTIONS: Therapeutic exercises, Therapeutic activity, Neuromuscular re-education, Balance training, Gait training, Patient/Family education, and DME instructions  PLAN FOR NEXT SESSION: Continue to work on stepping strategies, posterior weight shifting. Work on Investment banker, operational with RW vs. U Step walker  - focus on incr stride length (try either counting steps or try with laser again). Pt's spouse  ordered a laser and will try to put it on pt's Ustep. Use scifit    Drake Leach, PT, DPT  10/31/2021, 11:29 AM

## 2021-10-31 NOTE — Telephone Encounter (Signed)
Patient Advocate Encounter  Prior Authorization for [employer] [id number]/ML solution has been approved.    PA# [id number] Effective dates: 10/30/2021 through 05/17/2022  Is Covered through Medicare Part B    Roland Earl, CPhT Pharmacy Patient Advocate Specialist [facility] Patient Advocate Team Direct Number: [phone]  Fax: [phone]

## 2021-11-05 ENCOUNTER — Ambulatory Visit: Payer: PPO | Admitting: Physical Therapy

## 2021-11-05 ENCOUNTER — Encounter: Payer: Self-pay | Admitting: Physical Therapy

## 2021-11-05 ENCOUNTER — Ambulatory Visit (INDEPENDENT_AMBULATORY_CARE_PROVIDER_SITE_OTHER): Payer: PPO

## 2021-11-05 DIAGNOSIS — Z5181 Encounter for therapeutic drug level monitoring: Secondary | ICD-10-CM | POA: Diagnosis not present

## 2021-11-05 DIAGNOSIS — R293 Abnormal posture: Secondary | ICD-10-CM | POA: Diagnosis not present

## 2021-11-05 DIAGNOSIS — M6281 Muscle weakness (generalized): Secondary | ICD-10-CM

## 2021-11-05 DIAGNOSIS — R2681 Unsteadiness on feet: Secondary | ICD-10-CM

## 2021-11-05 DIAGNOSIS — R2689 Other abnormalities of gait and mobility: Secondary | ICD-10-CM

## 2021-11-05 LAB — POCT INR: INR: 2 (ref 2.0–3.0)

## 2021-11-05 NOTE — Patient Instructions (Signed)
Description   Take 2 tablets today and then continue taking 1 tablet daily except 1/2 tablet on Sundays and Thursdays.  Recheck INR in 4 weeks.  Call [phone] call with any new medications or if scheduled for any other procedures.  Clearance/Procedure Fax #[phone]

## 2021-11-05 NOTE — Therapy (Signed)
OUTPATIENT PHYSICAL THERAPY NEURO TREATMENT   Patient Name: Donald [name] MRN: [id number] DOB:June 24, 1940, 81 y.o., male Today's Date: 11/05/2021   PCP: Haywood Pao, MD REFERRING PROVIDER: Ludwig Clarks, DO   PT End of Session - 11/05/21 0930     Visit Number 4    Number of Visits 17    Date for PT Re-Evaluation 01/18/22   due to potential delay in scheduling   Authorization Type HTA    PT Start Time 0926    PT Stop Time 1011    PT Time Calculation (min) 45 min    Equipment Utilized During Treatment Gait belt    Activity Tolerance Patient tolerated treatment well    Behavior During Therapy [facility] for tasks assessed/performed;Impulsive              Past Medical History:  Diagnosis Date   Abnormality of gait 08/27/2014   Anxiety    Aortic stenosis    s/p AVR by Dr Cyndia Bent   Arthritis    left wrist- probable- not diagonised   Asthma    in the past   CAD (coronary artery disease)    s/p CABG 2006   GERD (gastroesophageal reflux disease)    H/O seasonal allergies    H/O: rheumatic fever    Hearing deficit    Bilateral hearing aids   Hematuria    had CT scan   Hiatal hernia    History of kidney stones    HOH (hard of hearing)    Hyperlipidemia    Hypertension    Hyperthyroidism    Inguinal hernia    right side; watching at this time per wife's report   Memory difficulty 08/27/2014   Pacemaker    Parkinson's disease    Persistent atrial fibrillation ([place])    Scarlet fever    as a child   Shortness of breath    Sick sinus syndrome ([place])    s/p PPM (MDT) 2006   Past Surgical History:  Procedure Laterality Date   AORTIC VALVE REPLACEMENT  2006   CARDIOVERSION N/A 09/14/2013   Procedure: CARDIOVERSION;  Surgeon: Sanda Klein, MD;  Location: [place] [facility];  Service: Cardiovascular;  Laterality: N/A;   CHOLECYSTECTOMY N/A 07/21/2013   Procedure: LAPAROSCOPIC CHOLECYSTECTOMY;  Surgeon: Edward Jolly, MD;  Location: [place];  Service: General;   Laterality: N/A;   COLONOSCOPY W/ POLYPECTOMY     COLONOSCOPY WITH PROPOFOL N/A 08/31/2016   Procedure: COLONOSCOPY WITH PROPOFOL;  Surgeon: Wilford Corner, MD;  Location: [facility];  Service: Endoscopy;  Laterality: N/A;   CORONARY ARTERY BYPASS GRAFT  2006   DENTAL SURGERY     implants   INSERT / REPLACE / REMOVE PACEMAKER  2006, 2012   MDT for sick sinus syndrome, gen change 8/12 by [place]   Patient Active Problem List   Diagnosis Date Noted   HOH (hard of hearing) 12/18/2020   Personal history of colonic polyps 08/31/2016   Abnormality of gait 08/27/2014   Memory difficulty 08/27/2014   Permanent atrial fibrillation ([place]) 09/08/2013   Cholelithiases 07/19/2013   Cholelithiasis 07/19/2013   Encounter for therapeutic drug monitoring 07/03/2013   Mechanical aortic valve 04/05/2013   Parkinson disease ([place]) 09/27/2012   Orthostatic hypotension 04/01/2012   Pacemaker-Medtronic 03/29/2012   HEPATITIS A 06/04/2010   HYPERTHYROIDISM 06/04/2010   HYPERLIPIDEMIA 06/04/2010   Essential hypertension 06/04/2010   Coronary artery disease involving native coronary artery of native heart without angina pectoris  06/04/2010   SICK SINUS SYNDROME 06/04/2010   HIATAL HERNIA 06/04/2010   SHORTNESS OF BREATH 06/04/2010   CHEST PAIN 06/04/2010    ONSET DATE: 09/25/2021  REFERRING DIAG: G20 (ICD-10-CM) - Parkinson's disease (HCC)  THERAPY DIAG:  Abnormal posture  Other abnormalities of gait and mobility  Unsteadiness on feet  Muscle weakness (generalized)  Rationale for Evaluation and Treatment Rehabilitation  SUBJECTIVE:                                                                                                                                                                                              SUBJECTIVE STATEMENT: Brought in his U-Step Rollator from home as well as the laser box to be attached. No falls. Pt's spouse states that since they  have changed his BP meds, he has had no falls.   Pt accompanied by: significant other Wife Donald [name]  PERTINENT HISTORY: PD (diagnosed in 2012), CAD (s/p CABG 2006), HTN, a fib, pacemaker, GERD, on anticoagulants   PAIN:  Are you having pain? No  PRECAUTIONS: Fall, ICD/Pacemaker, and Other: Wears a helmet, elbow and knees pads to help with falling, pt very HOH  PLOF: Independent with basic ADLs, Independent with household mobility with device, and Leisure: being outside in the yard, watches TV  PATIENT GOALS Pt and pt's wife want to work on pt getting off of his toes when walking and taking bigger steps.     OBJECTIVE:   TODAY'S TREATMENT:   Pt brought in his U-Step Rollator from home and a box with a laser as a visual cue for gait. At start of session, supervisor of this clinic attached laser box to pt's rollator and PT adjusted height of rollator (it was initially too tall)  TRANSFERS: Assistive device utilized: Environmental consultant - 2 wheeled  Sit to stand: SBA Stand to sit: SBA With BUE support from chair/mat table throughout session, needs intermittent cues for proper technique - scooting out towards edge, tucking feet back under, wider BOS, and incr forward lean. Performed approx. 8-10 reps throughout session.  When turning with U-Step Rollator to sit down on mat table, cued for staying close to rollator and taking big steps to the laser.    GAIT: Gait pattern: step to pattern, step through pattern, decreased step length- Right, decreased step length- Left, decreased stride length, decreased hip/knee flexion- Right, decreased hip/knee flexion- Left, decreased ankle dorsiflexion- Right, decreased ankle dorsiflexion- Left, knee flexed in stance- Right, knee flexed in stance- Left, shuffling, poor foot clearance- Right, and poor foot clearance- Left Distance walked: Into and out of clinic with U-Step Rollator, 230' x 1,  115' x 1, plus additional clinic distances with session.  Assistive device  utilized: Environmental consultant - 4 wheeled - U-Step Rollator  Level of assistance: CGA  Comments:  Pt brought in his U-Step Rollator from home. Practiced after laser was attached. Showed pt how to turn laser on and off. Cued with laser to use it as a visual cue to take big steps to for improved stride length, foot clearance. Pt did better with this today. Pt responded well to the cue "red light on sneaker". Pt had a couple episodes of festination when he was distracted or initially when standing up. Cued to stop and reset and gets heels on the ground (as pt comes up onto his toes), rock and then take a big step again to continue. Also reviewed with pt that initially when standing to stop and get balance, rock and weight shift to take a big step as pt does tend to festinate when he stands up and initially starts to walk.   Practiced going in and out of doorways and turning in a small space x3 reps - cued to focus on laser as visual cue when going through doorways and when turning as this is when pt will tend to festinate. Pt improved with this with incr reps     NMR: At countertop with wide BOS (needs visual/demo cues for wider BOS for balance) -Lateral/superior reaching towards sticky notes x10 reps each side, then performed x0 reps lateral reaching each side towards sticky note target, cued to perform slowly for weight shifting and holding at end range when reaching for 5 seconds. Verbal/tactile/demo cues that when shifting weight to the side to keep that foot FULLY on the floor with heel down.   Alternating posterior step and weight shift with BUE support x10 reps each side, verbal/demo cues for technique for weight shifting and proper step length, cued to get back foot fully on floor for full weight shift and to perform slowly. Pt performed better today compared to a couple sessions ago. Able to get back foot fully on the floor.     PATIENT EDUCATION: Education details: Using laser as visual cue with  rollator for gait, reviewed cues for freezing/festination episodes- to STOP when he comes up on his toes. Reset with feet on floor before continuing to take a big step.  Person educated: Patient and Spouse Education method: Explanation, Demonstration, and Verbal cues Education comprehension: verbalized understanding, returned demonstration, and verbal cues required   HOME EXERCISE PROGRAM: Access Code: [email or web address]    GOALS: Goals reviewed with patient? Yes  SHORT TERM GOALS: Target date: 11/17/2021  Pt will perform initial HEP with supervision from pt's spouse for strength, balance, and transfers in order to build upon functional gains made in therapy. Baseline: Will need review from previous HEP Goal status: INITIAL  2.  Pt and pt's spouse will verbalize understanding of techniques to help with freezing/festination episodes in order to demo improved safety with gait. Baseline: Will need further review.  Goal status: INITIAL  3.  Pt will improve gait speed with RW vs. U step rollator to at least 1.3 ft/sec in order to demo improved community mobility.   Baseline: 30.28 seconds with RW = 1.08 ft/sec Goal status: INITIAL  4.  Pt will improve TUG time to 34 seconds or less with RW vs. U-step walker in order to demo decrease fall risk, improved step length and improved ability to turn  Baseline: 40.84 seconds with RW, pt taking incr time to turn  Goal status: INITIAL    LONG TERM GOALS: Target date: 12/15/2021   Pt will perform final HEP with supervision from pt's spouse for strength, balance, and transfers in order to build upon functional gains made in therapy Baseline:  Goal status: INITIAL  2.  Pt will perform 5x sit <> stand in 17.5 seconds or less with UE support with proper technique and no BLE bracing in order to demo improved functional transfers and decr fall risk.  Baseline: 19.62 seconds Goal status: INITIAL  3.  Pt will improve gait speed with RW vs. U step rollator to  at least 1.8 ft/sec in order to demo improved community mobility.  Baseline:  30.28 seconds with RW = 1.08 ft/sec Goal status: INITIAL  4.  Pt will improve distance to at least 280' in order to demo improved gait efficiency and taking larger stride length in order to demo improved community/household mobility.  Baseline: 34' with RW Goal status: INITIAL  5.   Pt and pt's spouse will verbalize understanding of fall prevention in the home.  Baseline:  Goal status: INITIAL  6.  Pt will improve TUG time to 29 seconds or less with RW vs. U-step walker in order to demo decrease fall risk, improved step length and improved ability to turn Baseline: 40.84 seconds with RW, pt taking incr time to turn Goal status: INITIAL  ASSESSMENT:  CLINICAL IMPRESSION: Pt's spouse bought a laser from [employer] and pt's U-Step Rollator in from home. Attached laser to pt's U-Step and continued to work on gait training with use of laser as visual cue for incr stride length/foot clearance. Pt did well today when cued from therapist when going around therapy gym, but will need continued practice when going through doorways or when turning. Overall, pt's quality of gait is improved with U-Step Rollator compared to RW and with use of laser. Pt still needs cues to stop and rest and weight shift before continuing to walk with festination episodes.  Will continue to progress towards LTGs.     OBJECTIVE IMPAIRMENTS Abnormal gait, decreased activity tolerance, decreased balance, decreased coordination, decreased endurance, decreased knowledge of use of DME, decreased ROM, decreased strength, decreased safety awareness, impaired flexibility, and postural dysfunction.   ACTIVITY LIMITATIONS carrying, lifting, bending, standing, stairs, transfers, bed mobility, reach over head, and locomotion level  PARTICIPATION LIMITATIONS: cleaning, laundry, medication management, community activity, and yard work  PERSONAL FACTORS  Age, Behavior pattern, Past/current experiences, Time since onset of injury/illness/exacerbation, and 3+ comorbidities: PD (diagnosed in 2012), CAD (s/p CABG 2006), HTN, a fib, pacemaker, GERD, on anticoagulants   are also affecting patient's functional outcome.   REHAB POTENTIAL: Good  CLINICAL DECISION MAKING: Evolving/moderate complexity  EVALUATION COMPLEXITY: Moderate  PLAN: PT FREQUENCY: 2x/week  PT DURATION: 12 weeks  PLANNED INTERVENTIONS: Therapeutic exercises, Therapeutic activity, Neuromuscular re-education, Balance training, Gait training, Patient/Family education, and DME instructions  PLAN FOR NEXT SESSION: Continue to work on stepping strategies - lateral and posterior weight shifting. Work on Investment banker, operational with U-Step Environmental consultant with use of laser and try going around obstacles. Try SciFit.   Drake Leach, PT, DPT  11/05/2021, 10:16 AM

## 2021-11-07 ENCOUNTER — Ambulatory Visit: Payer: PPO | Admitting: Physical Therapy

## 2021-11-07 DIAGNOSIS — R2689 Other abnormalities of gait and mobility: Secondary | ICD-10-CM

## 2021-11-07 DIAGNOSIS — R293 Abnormal posture: Secondary | ICD-10-CM | POA: Diagnosis not present

## 2021-11-07 DIAGNOSIS — R2681 Unsteadiness on feet: Secondary | ICD-10-CM

## 2021-11-07 DIAGNOSIS — M6281 Muscle weakness (generalized): Secondary | ICD-10-CM

## 2021-11-10 ENCOUNTER — Ambulatory Visit: Payer: PPO | Admitting: Physical Therapy

## 2021-11-10 ENCOUNTER — Encounter: Payer: Self-pay | Admitting: Physical Therapy

## 2021-11-10 DIAGNOSIS — M6281 Muscle weakness (generalized): Secondary | ICD-10-CM

## 2021-11-10 DIAGNOSIS — R293 Abnormal posture: Secondary | ICD-10-CM | POA: Diagnosis not present

## 2021-11-10 DIAGNOSIS — R2689 Other abnormalities of gait and mobility: Secondary | ICD-10-CM

## 2021-11-10 DIAGNOSIS — R2681 Unsteadiness on feet: Secondary | ICD-10-CM

## 2021-11-12 ENCOUNTER — Ambulatory Visit: Payer: PPO | Admitting: Physical Therapy

## 2021-11-12 DIAGNOSIS — R2681 Unsteadiness on feet: Secondary | ICD-10-CM

## 2021-11-12 DIAGNOSIS — R2689 Other abnormalities of gait and mobility: Secondary | ICD-10-CM

## 2021-11-12 DIAGNOSIS — R293 Abnormal posture: Secondary | ICD-10-CM | POA: Diagnosis not present

## 2021-11-12 DIAGNOSIS — M6281 Muscle weakness (generalized): Secondary | ICD-10-CM

## 2021-11-12 NOTE — Therapy (Signed)
OUTPATIENT PHYSICAL THERAPY NEURO TREATMENT   Patient Name: Donald [name] MRN: [id number] DOB:January 02, 1941, 81 y.o., male Today's Date: 11/12/2021   PCP: Donald Garbe, MD REFERRING PROVIDER: Vladimir Faster, DO   PT End of Session - 11/12/21 0930     Visit Number 7    Number of Visits 17    Date for PT Re-Evaluation 01/18/22   due to potential delay in scheduling   Authorization Type HTA    PT Start Time 0929    PT Stop Time 1013    PT Time Calculation (min) 44 min    Equipment Utilized During Treatment Gait belt    Activity Tolerance Patient tolerated treatment well    Behavior During Therapy [facility] for tasks assessed/performed;Impulsive                 Past Medical History:  Diagnosis Date   Abnormality of gait 08/27/2014   Anxiety    Aortic stenosis    s/p AVR by Dr Laneta Simmers   Arthritis    left wrist- probable- not diagonised   Asthma    in the past   CAD (coronary artery disease)    s/p CABG 2006   GERD (gastroesophageal reflux disease)    H/O seasonal allergies    H/O: rheumatic fever    Hearing deficit    Bilateral hearing aids   Hematuria    had CT scan   Hiatal hernia    History of kidney stones    HOH (hard of hearing)    Hyperlipidemia    Hypertension    Hyperthyroidism    Inguinal hernia    right side; watching at this time per wife's report   Memory difficulty 08/27/2014   Pacemaker    Parkinson's disease    Persistent atrial fibrillation (HCC)    Scarlet fever    as a child   Shortness of breath    Sick sinus syndrome (HCC)    s/p PPM (MDT) 2006   Past Surgical History:  Procedure Laterality Date   AORTIC VALVE REPLACEMENT  2006   CARDIOVERSION N/A 09/14/2013   Procedure: CARDIOVERSION;  Surgeon: Thurmon Fair, MD;  Location: [facility] ENDOSCOPY;  Service: Cardiovascular;  Laterality: N/A;   CHOLECYSTECTOMY N/A 07/21/2013   Procedure: LAPAROSCOPIC CHOLECYSTECTOMY;  Surgeon: Mariella Saa, MD;  Location: [facility] OR;  Service: General;   Laterality: N/A;   COLONOSCOPY W/ POLYPECTOMY     COLONOSCOPY WITH PROPOFOL N/A 08/31/2016   Procedure: COLONOSCOPY WITH PROPOFOL;  Surgeon: Charlott Rakes, MD;  [facility];  Service: Endoscopy;  Laterality: N/A;   CORONARY ARTERY BYPASS GRAFT  2006   DENTAL SURGERY     implants   INSERT / REPLACE / REMOVE PACEMAKER  2006, 2012   MDT for sick sinus syndrome, gen change 8/12 by [facility]   KIDNEY STONE SURGERY  1984   Patient Active Problem List   Diagnosis Date Noted   HOH (hard of hearing) 12/18/2020   Personal history of colonic polyps 08/31/2016   Abnormality of gait 08/27/2014   Memory difficulty 08/27/2014   Permanent atrial fibrillation (HCC) 09/08/2013   Cholelithiases 07/19/2013   Cholelithiasis 07/19/2013   Encounter for therapeutic drug monitoring 07/03/2013   Mechanical aortic valve 04/05/2013   Parkinson disease (HCC) 09/27/2012   Orthostatic hypotension 04/01/2012   Pacemaker-Medtronic 03/29/2012   HEPATITIS A 06/04/2010   HYPERTHYROIDISM 06/04/2010   HYPERLIPIDEMIA 06/04/2010   Essential hypertension 06/04/2010   Coronary artery disease involving native coronary artery of native heart  without angina pectoris 06/04/2010   SICK SINUS SYNDROME 06/04/2010   HIATAL HERNIA 06/04/2010   SHORTNESS OF BREATH 06/04/2010   CHEST PAIN 06/04/2010    ONSET DATE: 09/25/2021  REFERRING DIAG: G20 (ICD-10-CM) - Parkinson's disease (HCC)  THERAPY DIAG:  Unsteadiness on feet  Other abnormalities of gait and mobility  Muscle weakness (generalized)  Abnormal posture  Rationale for Evaluation and Treatment Rehabilitation  SUBJECTIVE:                                                                                                                                                                                              SUBJECTIVE STATEMENT: Pt's wife reports pt has had one fall since last visit, fell on Monday night in garage. No injuries reported. Pt's laser on  U-step not properly aligned upon arrival.   Pt accompanied by: significant other Wife Donald [name]  PERTINENT HISTORY: PD (diagnosed in 2012), CAD (s/p CABG 2006), HTN, a fib, pacemaker, GERD, on anticoagulants   PAIN:  Are you having pain? No  PRECAUTIONS: Fall, ICD/Pacemaker, and Other: Wears a helmet, elbow and knees pads to help with falling, pt very HOH  PLOF: Independent with basic ADLs, Independent with household mobility with device, and Leisure: being outside in the yard, watches TV  PATIENT GOALS Pt and pt's wife want to work on pt getting off of his toes when walking and taking bigger steps.    OBJECTIVE:   TODAY'S TREATMENT:  Self-care/home management Beginning of session spent adjusting pt's laser on U-step walker, as box became too loose and laser changed position. Therapists able to screw laser back into place and educated pt and wife on ensuring laser is tightly screwed into place to reduce risk of laser changing position.   Discussed potential use for rollator vs RW (what pt is currently using) when pt going to outdoor shed to work on tractor. Pt's wife and therapist educated pt on safety risks with going out to tractor, but pt adamant that he will go.   Ther Ex  SciFit multi-peaks level 5 for 9 minutes with BUE/BLE at ROM, activity tolerance, and reciprocal movement patterns. Pt reporting effort level as an 8/10.   Gait Training   Gait pattern: step to pattern, step through pattern, decreased step length- Right, decreased step length- Left, decreased stride length, decreased hip/knee flexion- Right, decreased hip/knee flexion- Left, decreased ankle dorsiflexion- Right, decreased ankle dorsiflexion- Left, knee flexed in stance- Right, knee flexed in stance- Left, shuffling, poor foot clearance- Right, and poor foot clearance- Left Distance walked: >500' outside on sidewalk and around clinic  Assistive device utilized: Environmental consultant -  4 wheeled  Level of assistance: CGA Comments:  Practiced use of regular rollator outside to determine if this is safe for pt to use outside at home. Pt able to manage brakes of rollator well, mod verbal cues for increased step length and facilitation of heel strike throughout. Lengthy discussion w/pt regarding safety risks w/use of RW over grass and gravel, as pt currently picking up RW and placing it on unlevel surfaces to get out to tractor. Pt and wife in agreement that rollator is safer option and will think about purchasing one.     PATIENT EDUCATION: Education details: Options for purchasing rollator for use outside at home, as pt currently using RW over grass and is very unsafe Person educated: Patient and Spouse Education method: Explanation, Demonstration, and Verbal cues Education comprehension: verbalized understanding, returned demonstration, and verbal cues required   HOME EXERCISE PROGRAM: Access Code: [email or web address]    GOALS: Goals reviewed with patient? Yes  SHORT TERM GOALS: Target date: 11/17/2021  Pt will perform initial HEP with supervision from pt's spouse for strength, balance, and transfers in order to build upon functional gains made in therapy. Baseline: Will need review from previous HEP Goal status: INITIAL  2.  Pt and pt's spouse will verbalize understanding of techniques to help with freezing/festination episodes in order to demo improved safety with gait. Baseline: Will need further review.  Goal status: INITIAL  3.  Pt will improve gait speed with RW vs. U step rollator to at least 1.3 ft/sec in order to demo improved community mobility.   Baseline: 30.28 seconds with RW = 1.08 ft/sec Goal status: INITIAL  4.  Pt will improve TUG time to 34 seconds or less with RW vs. U-step walker in order to demo decrease fall risk, improved step length and improved ability to turn  Baseline: 40.84 seconds with RW, pt taking incr time to turn Goal status: INITIAL    LONG TERM GOALS: Target date: 12/15/2021  Pt  will perform final HEP with supervision from pt's spouse for strength, balance, and transfers in order to build upon functional gains made in therapy Baseline:  Goal status: INITIAL  2.  Pt will perform 5x sit <> stand in 17.5 seconds or less with UE support with proper technique and no BLE bracing in order to demo improved functional transfers and decr fall risk.  Baseline: 19.62 seconds Goal status: INITIAL  3.  Pt will improve gait speed with RW vs. U step rollator to at least 1.8 ft/sec in order to demo improved community mobility.  Baseline:  30.28 seconds with RW = 1.08 ft/sec Goal status: INITIAL  4.  Pt will improve 3MWT distance to at least 280' in order to demo improved gait efficiency and taking larger stride length in order to demo improved community/household mobility.  Baseline: 34' with RW Goal status: INITIAL  5.   Pt and pt's spouse will verbalize understanding of fall prevention in the home.  Baseline:  Goal status: INITIAL  6.  Pt will improve TUG time to 29 seconds or less with RW vs. U-step walker in order to demo decrease fall risk, improved step length and improved ability to turn Baseline: 40.84 seconds with RW, pt taking incr time to turn Goal status: INITIAL  ASSESSMENT:  CLINICAL IMPRESSION: Emphasis of skilled PT session on gait training w/rollator on unlevel surfaces. Pt's laser box not adjusted correctly upon arrival to clinic. Therapy able to reposition laser and tighten screws to hold laser in place. Pt's wife  reported pt is using RW to walk from house to outdoor shed (over grass, gravel and dirt) and has to pick up walker and carry it as it will not roll over unlevel surfaces. Practiced use of rollator outside to determine if this will be a safer option for home use (wife does not allow pt to use U-step to go to shed, as it gets very dirty and then tracks dirt in house). Pt managed rollator well, required min cues to avoid pulling brakes throughout. Pt  could benefit from continued practice. Continue POC.     OBJECTIVE IMPAIRMENTS Abnormal gait, decreased activity tolerance, decreased balance, decreased coordination, decreased endurance, decreased knowledge of use of DME, decreased ROM, decreased strength, decreased safety awareness, impaired flexibility, and postural dysfunction.   ACTIVITY LIMITATIONS carrying, lifting, bending, standing, stairs, transfers, bed mobility, reach over head, and locomotion level  PARTICIPATION LIMITATIONS: cleaning, laundry, medication management, community activity, and yard work  PERSONAL FACTORS Age, Behavior pattern, Past/current experiences, Time since onset of injury/illness/exacerbation, and 3+ comorbidities: PD (diagnosed in 2012), CAD (s/p CABG 2006), HTN, a fib, pacemaker, GERD, on anticoagulants   are also affecting patient's functional outcome.   REHAB POTENTIAL: Good  CLINICAL DECISION MAKING: Evolving/moderate complexity  EVALUATION COMPLEXITY: Moderate  PLAN: PT FREQUENCY: 2x/week  PT DURATION: 12 weeks  PLANNED INTERVENTIONS: Therapeutic exercises, Therapeutic activity, Neuromuscular re-education, Balance training, Gait training, Patient/Family education, and DME instructions  PLAN FOR NEXT SESSION: goal assessment, SciFit, Continue to work on stepping strategies and weight shifting posteriorly. SLS tasks, larger amplitude movement patterns. Work on Personnel officer with U-Step Environmental consultant with use of laser and try going around obstacles. Work on turns with U-Step, ball kicks, rollator outside   [employer], PT, DPT  11/12/2021, 11:52 AM

## 2021-11-19 ENCOUNTER — Ambulatory Visit: Payer: PPO | Attending: Neurology | Admitting: Physical Therapy

## 2021-11-19 DIAGNOSIS — M6281 Muscle weakness (generalized): Secondary | ICD-10-CM | POA: Diagnosis not present

## 2021-11-19 DIAGNOSIS — R2689 Other abnormalities of gait and mobility: Secondary | ICD-10-CM | POA: Insufficient documentation

## 2021-11-19 DIAGNOSIS — R293 Abnormal posture: Secondary | ICD-10-CM | POA: Diagnosis not present

## 2021-11-19 DIAGNOSIS — R2681 Unsteadiness on feet: Secondary | ICD-10-CM | POA: Diagnosis not present

## 2021-11-19 NOTE — Therapy (Signed)
OUTPATIENT PHYSICAL THERAPY NEURO TREATMENT   Patient Name: Donald [name] MRN: [id number] DOB:Aug 16, 1940, 81 y.o., male Today's Date: 11/19/2021   PCP: Haywood Pao, MD REFERRING PROVIDER: Ludwig Clarks, DO   PT End of Session - 11/19/21 [id number]     Visit Number 8    Number of Visits 17    Date for PT Re-Evaluation 01/18/22   due to potential delay in scheduling   Authorization Type HTA    PT Start Time 0931    PT Stop Time 1014    PT Time Calculation (min) 43 min    Equipment Utilized During Treatment Gait belt    Activity Tolerance Patient tolerated treatment well    Behavior During Therapy [facility] for tasks assessed/performed;Impulsive                  Past Medical History:  Diagnosis Date   Abnormality of gait 08/27/2014   Anxiety    Aortic stenosis    s/p AVR by Dr Cyndia Bent   Arthritis    left wrist- probable- not diagonised   Asthma    in the past   CAD (coronary artery disease)    s/p CABG 2006   GERD (gastroesophageal reflux disease)    H/O seasonal allergies    H/O: rheumatic fever    Hearing deficit    Bilateral hearing aids   Hematuria    had CT scan   Hiatal hernia    History of kidney stones    HOH (hard of hearing)    Hyperlipidemia    Hypertension    Hyperthyroidism    Inguinal hernia    right side; watching at this time per wife's report   Memory difficulty 08/27/2014   Pacemaker    Parkinson's disease    Persistent atrial fibrillation ([place])    Scarlet fever    as a child   Shortness of breath    Sick sinus syndrome ([place])    s/p PPM (MDT) 2006   Past Surgical History:  Procedure Laterality Date   AORTIC VALVE REPLACEMENT  2006   CARDIOVERSION N/A 09/14/2013   Procedure: CARDIOVERSION;  Surgeon: Sanda Klein, MD;  Location: [facility] ENDOSCOPY;  Service: Cardiovascular;  Laterality: N/A;   CHOLECYSTECTOMY N/A 07/21/2013   Procedure: LAPAROSCOPIC CHOLECYSTECTOMY;  Surgeon: Edward Jolly, MD;  Location: [place];  Service: General;   Laterality: N/A;   COLONOSCOPY W/ POLYPECTOMY     COLONOSCOPY WITH PROPOFOL N/A 08/31/2016   Procedure: COLONOSCOPY WITH PROPOFOL;  Surgeon: Wilford Corner, MD;  [facility] ENDOSCOPY;  Service: Endoscopy;  Laterality: N/A;   CORONARY ARTERY BYPASS GRAFT  2006   DENTAL SURGERY     implants   INSERT / REPLACE / REMOVE PACEMAKER  2006, 2012   MDT for sick sinus syndrome, gen change 8/12 by [place]   Patient Active Problem List   Diagnosis Date Noted   HOH (hard of hearing) 12/18/2020   Personal history of colonic polyps 08/31/2016   Abnormality of gait 08/27/2014   Memory difficulty 08/27/2014   Permanent atrial fibrillation ([place]) 09/08/2013   Cholelithiases 07/19/2013   Cholelithiasis 07/19/2013   Encounter for therapeutic drug monitoring 07/03/2013   Mechanical aortic valve 04/05/2013   Parkinson disease ([place]) 09/27/2012   Orthostatic hypotension 04/01/2012   Pacemaker-Medtronic 03/29/2012   HEPATITIS A 06/04/2010   HYPERTHYROIDISM 06/04/2010   HYPERLIPIDEMIA 06/04/2010   Essential hypertension 06/04/2010   Coronary artery disease involving native coronary artery of native  heart without angina pectoris 06/04/2010   SICK SINUS SYNDROME 06/04/2010   HIATAL HERNIA 06/04/2010   SHORTNESS OF BREATH 06/04/2010   CHEST PAIN 06/04/2010    ONSET DATE: 09/25/2021  REFERRING DIAG: G20 (ICD-10-CM) - Parkinson's disease ([place])  THERAPY DIAG:  Unsteadiness on feet  Other abnormalities of gait and mobility  Muscle weakness (generalized)  Rationale for Evaluation and Treatment Rehabilitation  SUBJECTIVE:                                                                                                                                                                                              SUBJECTIVE STATEMENT: No new changes to report, no new falls. Pt's wife waiting for [facility] prime Day (July 11-12) to order rollator   Pt accompanied by: significant  other Wife Donald [name]  PERTINENT HISTORY: PD (diagnosed in 2012), CAD (s/p CABG 2006), HTN, a fib, pacemaker, GERD, on anticoagulants   PAIN:  Are you having pain? No  PRECAUTIONS: Fall, ICD/Pacemaker, and Other: Wears a helmet, elbow and knees pads to help with falling, pt very HOH  PLOF: Independent with basic ADLs, Independent with household mobility with device, and Leisure: being outside in the yard, watches TV  PATIENT GOALS Pt and pt's wife want to work on pt getting off of his toes when walking and taking bigger steps.    OBJECTIVE:   TODAY'S TREATMENT:  Ther Act  STG assessment   [facility] PT Assessment - 11/19/21 0942       Ambulation/Gait   Gait velocity 32.8' over 14.56s= 2.25 ft/s with u-step      Timed Up and Go Test   Normal TUG (seconds) 21.41    TUG Comments No freezing noted             NMR Weaving around 8 cones down and back x3 w/U-step for improved step length, freezing strategies and AD management. Mod multimodal cues to maintain close distance to U-step as pt frequently steps outside of walker, triggering shuffling gait pattern and freezing. Min tactile cues provided throughout to stop and reset, as pt demonstrated significant festination and fighting freezing. Lengthy discussion regarding importance of maintaining safe distance to U-step and utilizing freezing strategies at home, as pt's wife reports pt is only performing them in clinic. Pt verbalized understanding and reported he would begin practicing at home. On last trial, pt able to maintain closer distance to U-step and improved step length but continues to exhibit toe-walking despite cues for heel strike.   Gait pattern: decreased step length- Right, decreased step length- Left, decreased stride length, decreased ankle dorsiflexion- Right, decreased ankle dorsiflexion-  Left, shuffling, festinating, trunk flexed, poor foot clearance- Right, and poor foot clearance- Left Distance walked: Various clinic  distances  Assistive device utilized:  U-Step walker  Level of assistance: SBA Comments: Mod verbal cues for increased step length ("Big steps"), maintaining safe distance to U-step and to facilitate heel strike    PATIENT EDUCATION: Education details: Goal outcomes, practicing quality walking at home w/U-step for improved carryover and retention  Person educated: Patient and Spouse Education method: Explanation, Demonstration, and Verbal cues Education comprehension: verbalized understanding, returned demonstration, verbal cues required, and needs further education   HOME EXERCISE PROGRAM: Access Code: [email or web address]   GOALS: Goals reviewed with patient? Yes  SHORT TERM GOALS: Target date: 11/17/2021  Pt will perform initial HEP with supervision from pt's spouse for strength, balance, and transfers in order to build upon functional gains made in therapy. Baseline: Will need review from previous HEP Goal status: MET  2.  Pt and pt's spouse will verbalize understanding of techniques to help with freezing/festination episodes in order to demo improved safety with gait. Baseline: Will need further review.  Goal status: MET  3.  Pt will improve gait speed with RW vs. U step rollator to at least 1.3 ft/sec in order to demo improved community mobility.   Baseline: 30.28 seconds with RW = 1.08 ft/sec; 2.25 ft/s w/ U-step  Goal status: INITIAL  4.  Pt will improve TUG time to 34 seconds or less with RW vs. U-step walker in order to demo decrease fall risk, improved step length and improved ability to turn  Baseline: 40.84 seconds with RW, pt taking incr time to turn; 21.41s w/U-step, no freezing noted  Goal status: MET    LONG TERM GOALS: Target date: 12/15/2021  Pt will perform final HEP with supervision from pt's spouse for strength, balance, and transfers in order to build upon functional gains made in therapy Baseline:  Goal status: INITIAL  2.  Pt will perform 5x sit <> stand in  17.5 seconds or less with UE support with proper technique and no BLE bracing in order to demo improved functional transfers and decr fall risk.  Baseline: 19.62 seconds Goal status: INITIAL  3.  Pt will improve gait speed with RW vs. U step rollator to at least 2.5 ft/sec in order to demo improved community mobility.  Baseline:  30.28 seconds with RW = 1.08 ft/sec; 2.25 ft/s w/U-step on 7/5  Goal status: REVISED  4.  Pt will improve 3MWT distance to at least 280' in order to demo improved gait efficiency and taking larger stride length in order to demo improved community/household mobility.  Baseline: 30' with RW Goal status: INITIAL  5.   Pt and pt's spouse will verbalize understanding of fall prevention in the home.  Baseline:  Goal status: INITIAL  6.  Pt will improve TUG time to 18 seconds or less with RW vs. U-step walker in order to demo decrease fall risk, improved step length and improved ability to turn Baseline: 40.84 seconds with RW, pt taking incr time to turn; 21.41s w/U-step on 7/5  Goal status: REVISED  ASSESSMENT:  CLINICAL IMPRESSION: Emphasis of skilled PT session on STG assessment and practice of freezing strategies. Pt has met 4/4 STGs, significantly improving his time on normal TUG and gait speed w/U-step walker compared to RW on eval. Pt able to verbalize freezing strategies but per wife, is not implementing at home. Practiced weaving through cones during sesison, as turns trigger pt's freezing, and provided mod  multimodal cues for proper management of AD and implementation of freezing techniques. Pt will benefit from continued practice. Continue POC.     OBJECTIVE IMPAIRMENTS Abnormal gait, decreased activity tolerance, decreased balance, decreased coordination, decreased endurance, decreased knowledge of use of DME, decreased ROM, decreased strength, decreased safety awareness, impaired flexibility, and postural dysfunction.   ACTIVITY LIMITATIONS carrying,  lifting, bending, standing, stairs, transfers, bed mobility, reach over head, and locomotion level  PARTICIPATION LIMITATIONS: cleaning, laundry, medication management, community activity, and yard work  PERSONAL FACTORS Age, Behavior pattern, Past/current experiences, Time since onset of injury/illness/exacerbation, and 3+ comorbidities: PD (diagnosed in 2012), CAD (s/p CABG 2006), HTN, a fib, pacemaker, GERD, on anticoagulants   are also affecting patient's functional outcome.   REHAB POTENTIAL: Good  CLINICAL DECISION MAKING: Evolving/moderate complexity  EVALUATION COMPLEXITY: Moderate  PLAN: PT FREQUENCY: 2x/week  PT DURATION: 12 weeks  PLANNED INTERVENTIONS: Therapeutic exercises, Therapeutic activity, Neuromuscular re-education, Balance training, Gait training, Patient/Family education, and DME instructions  PLAN FOR NEXT SESSION: SciFit, Continue to work on stepping strategies and weight shifting posteriorly. SLS tasks, larger amplitude movement patterns. Work on Personnel officer with U-Step Environmental consultant with use of laser and try going around obstacles. Work on turns with U-Step, ball kicks, rollator outside   [employer], PT, DPT  11/19/2021, 10:15 AM

## 2021-11-21 ENCOUNTER — Ambulatory Visit: Payer: PPO | Admitting: Physical Therapy

## 2021-11-21 DIAGNOSIS — R2681 Unsteadiness on feet: Secondary | ICD-10-CM

## 2021-11-21 DIAGNOSIS — M6281 Muscle weakness (generalized): Secondary | ICD-10-CM

## 2021-11-21 DIAGNOSIS — R2689 Other abnormalities of gait and mobility: Secondary | ICD-10-CM

## 2021-11-21 NOTE — Therapy (Signed)
OUTPATIENT PHYSICAL THERAPY NEURO TREATMENT   Patient Name: Donald [name] MRN: [id number] DOB:May 11, 1941, 81 y.o., male 83 Date: 11/21/2021   PCP: Haywood Pao, MD REFERRING PROVIDER: Ludwig Clarks, DO   PT End of Session - 11/21/21 1236     Visit Number 9    Number of Visits 17    Date for PT Re-Evaluation 01/18/22   due to potential delay in scheduling   Authorization Type HTA    PT Start Time 1233    PT Stop Time 1313    PT Time Calculation (min) 40 min    Equipment Utilized During Treatment Gait belt    Activity Tolerance Patient tolerated treatment well    Behavior During Therapy [name] for tasks assessed/performed;Impulsive                   Past Medical History:  Diagnosis Date   Abnormality of gait 08/27/2014   Anxiety    Aortic stenosis    s/p AVR by Dr Cyndia Bent   Arthritis    left wrist- probable- not diagonised   Asthma    in the past   CAD (coronary artery disease)    s/p CABG 2006   GERD (gastroesophageal reflux disease)    H/O seasonal allergies    H/O: rheumatic fever    Hearing deficit    Bilateral hearing aids   Hematuria    had CT scan   Hiatal hernia    History of kidney stones    HOH (hard of hearing)    Hyperlipidemia    Hypertension    Hyperthyroidism    Inguinal hernia    right side; watching at this time per wife's report   Memory difficulty 08/27/2014   Pacemaker    Parkinson's disease    Persistent atrial fibrillation ([place])    Scarlet fever    as a child   Shortness of breath    Sick sinus syndrome ([place])    s/p PPM (MDT) 2006   Past Surgical History:  Procedure Laterality Date   AORTIC VALVE REPLACEMENT  2006   CARDIOVERSION N/A 09/14/2013   Procedure: CARDIOVERSION;  Surgeon: Sanda Klein, MD;  Location: [place] ENDOSCOPY;  Service: Cardiovascular;  Laterality: N/A;   CHOLECYSTECTOMY N/A 07/21/2013   Procedure: LAPAROSCOPIC CHOLECYSTECTOMY;  Surgeon: Edward Jolly, MD;  Location: [place];  Service: General;   Laterality: N/A;   COLONOSCOPY W/ POLYPECTOMY     COLONOSCOPY WITH PROPOFOL N/A 08/31/2016   Procedure: COLONOSCOPY WITH PROPOFOL;  Surgeon: Wilford Corner, MD;  Location: [facility] ENDOSCOPY;  Service: Endoscopy;  Laterality: N/A;   CORONARY ARTERY BYPASS GRAFT  2006   DENTAL SURGERY     implants   INSERT / REPLACE / REMOVE PACEMAKER  2006, 2012   MDT for sick sinus syndrome, gen change 8/12 by [place]   Patient Active Problem List   Diagnosis Date Noted   HOH (hard of hearing) 12/18/2020   Personal history of colonic polyps 08/31/2016   Abnormality of gait 08/27/2014   Memory difficulty 08/27/2014   Permanent atrial fibrillation ([place]) 09/08/2013   Cholelithiases 07/19/2013   Cholelithiasis 07/19/2013   Encounter for therapeutic drug monitoring 07/03/2013   Mechanical aortic valve 04/05/2013   Parkinson disease ([place]) 09/27/2012   Orthostatic hypotension 04/01/2012   Pacemaker-Medtronic 03/29/2012   HEPATITIS A 06/04/2010   HYPERTHYROIDISM 06/04/2010   HYPERLIPIDEMIA 06/04/2010   Essential hypertension 06/04/2010   Coronary artery disease involving native coronary artery of  native heart without angina pectoris 06/04/2010   SICK SINUS SYNDROME 06/04/2010   HIATAL HERNIA 06/04/2010   SHORTNESS OF BREATH 06/04/2010   CHEST PAIN 06/04/2010    ONSET DATE: 09/25/2021  REFERRING DIAG: G20 (ICD-10-CM) - Parkinson's disease ([place])  THERAPY DIAG:  Unsteadiness on feet  Other abnormalities of gait and mobility  Muscle weakness (generalized)  Rationale for Evaluation and Treatment Rehabilitation  SUBJECTIVE:                                                                                                                                                                                              SUBJECTIVE STATEMENT: No new changes, wife verbalized concern that pt is not improving. No new falls   Pt accompanied by: significant other [name]  PERTINENT  HISTORY: PD (diagnosed in 2012), CAD (s/p CABG 2006), HTN, a fib, pacemaker, GERD, on anticoagulants   PAIN:  Are you having pain? No  PRECAUTIONS: Fall, ICD/Pacemaker, and Other: Wears a helmet, elbow and knees pads to help with falling, pt very HOH  PLOF: Independent with basic ADLs, Independent with household mobility with device, and Leisure: being outside in the yard, watches TV  PATIENT GOALS Pt and pt's wife want to work on pt getting off of his toes when walking and taking bigger steps.    OBJECTIVE:   TODAY'S TREATMENT: Self-care/home management  Lengthy discussion regarding importance of performing HEP consistently and being intentional w/gait at all times for improved safety and reduced fall risk. Pt's wife verbalized frustration with having to cue pt 24/7 and pt not listening to cues. Educated pt on how walking is no longer innate and requires increased effort/thought to improve kinematics, pt unable to understand. Pt's wife not sure PT is benefiting pt at this time as pt is not implementing any changes at home despite being educated by multiple therapists. Will reassess next week.   Ther Ex  SciFit multi-peaks level 7 for 9 minutes using BUE/BLEs for dynamic cardiovascular conditioning, neural priming for UE/LE reciprocal movement, bilateral calf stretch to facilitate heel strike w/gait and endurance. Noted decreased amplitude of RLE > LLE throughout despite cues.   Added standing marches to HEP (see bolded below) for improved lateral weight shifting, hip flexor strength and single leg stability to translate to improved step length/clearance with gait.   Gait Training  Gait pattern: step to pattern, decreased step length- Right, decreased step length- Left, decreased stance time- Right, decreased stride length, decreased hip/knee flexion- Right, decreased hip/knee flexion- Left, decreased ankle dorsiflexion- Right, decreased ankle dorsiflexion- Left, shuffling, festinating, trunk  flexed, narrow BOS, poor foot clearance-  Right, and poor foot clearance- Left Distance walked: 115' Assistive device utilized:  U-step Level of assistance: SBA Comments: Max verbal and visual cues provided for increased step length, lateral weight shifting and heel strike but pt unable to perform. Readjusted pt's laser on walker, as pt states he has not been able to see it for weeks    PATIENT EDUCATION: Education details: See self-care section  Person educated: Patient and Spouse Education method: Explanation, Demonstration, and Verbal cues Education comprehension: verbalized understanding, returned demonstration, verbal cues required, and needs further education   HOME EXERCISE PROGRAM: Access Code: [email or web address] URL: [email or web address] Date: 11/21/2021 Prepared by: Mickie Bail Unknown Schleyer  Program Notes Sit to Stand Transfers: 1. Scoot out to the edge of [place]. Place your feet flat on the floor, shoulder width apart.  Make sure your feet are tucked just under your knees.3. Lean forward (nose over toes) with momentum, and stand up tall with your best posture.  If you need to use your arms, use them as a quick boost up to stand.4. If you are in a low or soft chair, you can lean back and then forward up to stand, in order to get more momentum.5. Once you are standing, make sure you are looking ahead and standing tall. To sit down: 1. Back up until you feel the chair behind your legs.2. Bend at you hips, reaching  Back for you chair, if needed, then slowly squat to sit down on your chair.  Exercises - Side Stepping with Counter Support  - 2 x daily - 5 x weekly - 2 sets - 10 reps - Sit to Stand with Armchair  - 1 x daily - 5 x weekly - 2 sets - 10 reps - Alternating Step Forward with Support  - 2 x daily - 5 x weekly - 2 sets - 10 reps - Seated Hamstring Stretch  - 1-2 x daily - 5 x weekly - 3 sets - 30 hold - Standing March with Counter Support  - 1 x daily - 7 x weekly - 3 sets -  10 reps   GOALS: Goals reviewed with patient? Yes  SHORT TERM GOALS: Target date: 11/17/2021  Pt will perform initial HEP with supervision from pt's spouse for strength, balance, and transfers in order to build upon functional gains made in therapy. Baseline: Will need review from previous HEP Goal status: MET  2.  Pt and pt's spouse will verbalize understanding of techniques to help with freezing/festination episodes in order to demo improved safety with gait. Baseline: Will need further review.  Goal status: MET  3.  Pt will improve gait speed with RW vs. U step rollator to at least 1.3 ft/sec in order to demo improved community mobility.   Baseline: 30.28 seconds with RW = 1.08 ft/sec; 2.25 ft/s w/ U-step  Goal status: MET  4.  Pt will improve TUG time to 34 seconds or less with RW vs. U-step walker in order to demo decrease fall risk, improved step length and improved ability to turn  Baseline: 40.84 seconds with RW, pt taking incr time to turn; 21.41s w/U-step, no freezing noted  Goal status: MET    LONG TERM GOALS: Target date: 12/15/2021  Pt will perform final HEP with supervision from pt's spouse for strength, balance, and transfers in order to build upon functional gains made in therapy Baseline:  Goal status: INITIAL  2.  Pt will perform 5x sit <> stand in 17.5 seconds or less with UE support with  proper technique and no BLE bracing in order to demo improved functional transfers and decr fall risk.  Baseline: 19.62 seconds Goal status: INITIAL  3.  Pt will improve gait speed with RW vs. U step rollator to at least 2.5 ft/sec in order to demo improved community mobility.  Baseline:  30.28 seconds with RW = 1.08 ft/sec; 2.25 ft/s w/U-step on 7/5  Goal status: REVISED  4.  Pt will improve 3MWT distance to at least 280' in order to demo improved gait efficiency and taking larger stride length in order to demo improved community/household mobility.  Baseline: 62' with  RW Goal status: INITIAL  5.   Pt and pt's spouse will verbalize understanding of fall prevention in the home.  Baseline:  Goal status: INITIAL  6.  Pt will improve TUG time to 18 seconds or less with RW vs. U-step walker in order to demo decrease fall risk, improved step length and improved ability to turn Baseline: 40.84 seconds with RW, pt taking incr time to turn; 21.41s w/U-step on 7/5  Goal status: REVISED  ASSESSMENT:  CLINICAL IMPRESSION: Emphasis of skilled PT session on discussing importance of compliance w/exercise and gait training. Pt's wife reports he has not been implementing any education from therapists at home and is easily agitated when she cues him. Added standing marches to pt's HEP to facilitate single leg stance and lateral weight shifting for improved carryover w/gait training. Pt unable to facilitate increased step length or clearance w/cues today. Continue POC.     OBJECTIVE IMPAIRMENTS Abnormal gait, decreased activity tolerance, decreased balance, decreased coordination, decreased endurance, decreased knowledge of use of DME, decreased ROM, decreased strength, decreased safety awareness, impaired flexibility, and postural dysfunction.   ACTIVITY LIMITATIONS carrying, lifting, bending, standing, stairs, transfers, bed mobility, reach over head, and locomotion level  PARTICIPATION LIMITATIONS: cleaning, laundry, medication management, community activity, and yard work  PERSONAL FACTORS Age, Behavior pattern, Past/current experiences, Time since onset of injury/illness/exacerbation, and 3+ comorbidities: PD (diagnosed in 2012), CAD (s/p CABG 2006), HTN, a fib, pacemaker, GERD, on anticoagulants   are also affecting patient's functional outcome.   REHAB POTENTIAL: Good  CLINICAL DECISION MAKING: Evolving/moderate complexity  EVALUATION COMPLEXITY: Moderate  PLAN: PT FREQUENCY: 2x/week  PT DURATION: 12 weeks  PLANNED INTERVENTIONS: Therapeutic exercises,  Therapeutic activity, Neuromuscular re-education, Balance training, Gait training, Patient/Family education, and DME instructions  PLAN FOR NEXT SESSION: 10th visit PN, SciFit, Continue to work on stepping strategies and weight shifting posteriorly. SLS tasks, larger amplitude movement patterns. Work on Personnel officer with U-Step Environmental consultant with use of laser and try going around obstacles. Work on turns with U-Step, ball kicks, rollator outside   [place] [employer], PT, DPT  11/21/2021, 1:24 PM

## 2021-11-26 ENCOUNTER — Ambulatory Visit: Payer: PPO | Admitting: Physical Therapy

## 2021-11-26 DIAGNOSIS — M6281 Muscle weakness (generalized): Secondary | ICD-10-CM

## 2021-11-26 DIAGNOSIS — R2681 Unsteadiness on feet: Secondary | ICD-10-CM | POA: Diagnosis not present

## 2021-11-26 DIAGNOSIS — R2689 Other abnormalities of gait and mobility: Secondary | ICD-10-CM

## 2021-11-26 DIAGNOSIS — R293 Abnormal posture: Secondary | ICD-10-CM

## 2021-11-26 NOTE — Therapy (Signed)
OUTPATIENT PHYSICAL THERAPY NEURO TREATMENT- 10TH VISIT PROGRESS NOTE   Patient Name: Donald [name] MRN: [id number] DOB:November 14, 1940, 81 y.o., male Today's Date: 11/26/2021   PCP: Haywood Pao, MD REFERRING PROVIDER: Ludwig Clarks, DO Physical Therapy Progress Note   Dates of Reporting Period:10/20/21 - 11/26/21  See Note below for Objective Data and Assessment of Progress/Goals.  Thank you for the referral of this patient. [name], PT, DPT    PT End of Session - 11/26/21 0933     Visit Number 10    Number of Visits 17    Date for PT Re-Evaluation 01/18/22   due to potential delay in scheduling   Authorization Type HTA    PT Start Time 0930    PT Stop Time 1012    PT Time Calculation (min) 42 min    Equipment Utilized During Treatment --    Activity Tolerance Patient tolerated treatment well    Behavior During Therapy [facility] for tasks assessed/performed;Impulsive                    Past Medical History:  Diagnosis Date   Abnormality of gait 08/27/2014   Anxiety    Aortic stenosis    s/p AVR by Dr Cyndia Bent   Arthritis    left wrist- probable- not diagonised   Asthma    in the past   CAD (coronary artery disease)    s/p CABG 2006   GERD (gastroesophageal reflux disease)    H/O seasonal allergies    H/O: rheumatic fever    Hearing deficit    Bilateral hearing aids   Hematuria    had CT scan   Hiatal hernia    History of kidney stones    HOH (hard of hearing)    Hyperlipidemia    Hypertension    Hyperthyroidism    Inguinal hernia    right side; watching at this time per wife's report   Memory difficulty 08/27/2014   Pacemaker    Parkinson's disease    Persistent atrial fibrillation ([place])    Scarlet fever    as a child   Shortness of breath    Sick sinus syndrome ([place])    s/p PPM (MDT) 2006   Past Surgical History:  Procedure Laterality Date   AORTIC VALVE REPLACEMENT  2006   CARDIOVERSION N/A 09/14/2013   Procedure: CARDIOVERSION;   Surgeon: Sanda Klein, MD;  Location: [facility];  Service: Cardiovascular;  Laterality: N/A;   CHOLECYSTECTOMY N/A 07/21/2013   Procedure: LAPAROSCOPIC CHOLECYSTECTOMY;  Surgeon: Edward Jolly, MD;  Location: [facility];  Service: General;  Laterality: N/A;   COLONOSCOPY W/ POLYPECTOMY     COLONOSCOPY WITH PROPOFOL N/A 08/31/2016   Procedure: COLONOSCOPY WITH PROPOFOL;  Surgeon: Wilford Corner, MD;  Location: [facility];  Service: Endoscopy;  Laterality: N/A;   CORONARY ARTERY BYPASS GRAFT  2006   DENTAL SURGERY     implants   INSERT / REPLACE / REMOVE PACEMAKER  2006, 2012   MDT for sick sinus syndrome, gen change 8/12 by Weld   Patient Active Problem List   Diagnosis Date Noted   HOH (hard of hearing) 12/18/2020   Personal history of colonic polyps 08/31/2016   Abnormality of gait 08/27/2014   Memory difficulty 08/27/2014   Permanent atrial fibrillation ([place]) 09/08/2013   Cholelithiases 07/19/2013   Cholelithiasis 07/19/2013   Encounter for therapeutic drug monitoring 07/03/2013   Mechanical aortic valve 04/05/2013  Parkinson disease ([place]) 09/27/2012   Orthostatic hypotension 04/01/2012   Pacemaker-[employer] 03/29/2012   HEPATITIS A 06/04/2010   HYPERTHYROIDISM 06/04/2010   HYPERLIPIDEMIA 06/04/2010   Essential hypertension 06/04/2010   Coronary artery disease involving native coronary artery of native heart without angina pectoris 06/04/2010   SICK SINUS SYNDROME 06/04/2010   HIATAL HERNIA 06/04/2010   SHORTNESS OF BREATH 06/04/2010   CHEST PAIN 06/04/2010    ONSET DATE: 09/25/2021  REFERRING DIAG: G20 (ICD-10-CM) - Parkinson's disease ([place])  THERAPY DIAG:  Unsteadiness on feet  Other abnormalities of gait and mobility  Muscle weakness (generalized)  Abnormal posture  Rationale for Evaluation and Treatment Rehabilitation  SUBJECTIVE:                                                                                                                                                                                               SUBJECTIVE STATEMENT: Pt's wife reports things are starting to get better at home. No new falls. Wife has ordered a rollator for use outside to/from the house w/7.5" wheels   Pt accompanied by: significant other [name]  PERTINENT HISTORY: PD (diagnosed in 2012), CAD (s/p CABG 2006), HTN, a fib, pacemaker, GERD, on anticoagulants   PAIN:  Are you having pain? No  PRECAUTIONS: Fall, ICD/Pacemaker, and Other: Wears a helmet, elbow and knees pads to help with falling, pt very HOH  PLOF: Independent with basic ADLs, Independent with household mobility with device, and Leisure: being outside in the yard, watches TV  PATIENT GOALS Pt and pt's wife want to work on pt getting off of his toes when walking and taking bigger steps.    OBJECTIVE:   TODAY'S TREATMENT: Ther Ex  SciFit multi-peaks level 10 for 9 minutes using BUE/BLEs for dynamic cardiovascular conditioning, neural priming for UE/LE reciprocal movement, bilateral calf stretch to facilitate heel strike w/gait and endurance.   Added to HEP (See bolded below) for neural priming for heel strike, anterior tibialis strength and calf stretch. Pt able to demonstrate exercises well and instructed pt to perform heel taps to laser of U-step x3 per side prior to initiating gait to prime for heel strike bilaterally.     NMR In // bars for facilitation of heel strike, single leg stability and lateral weight shifting:  -Eccentric fwd heel taps from 6" step w/BUE, x15 per side. Min cues to maintain IC w/heel throughout. Noted increased difficulty maintaining heel contact w/RLE > LLE   Gait Training  Gait pattern: step to pattern, decreased step length- Right, decreased step length- Left, decreased stance time- Right, decreased stride length, decreased hip/knee flexion- Right,  decreased hip/knee flexion- Left, decreased ankle dorsiflexion- Right,  decreased ankle dorsiflexion- Left, shuffling, festinating, trunk flexed, narrow BOS, poor foot clearance- Right, and poor foot clearance- Left Distance walked: 115' x3  Assistive device utilized:  U-step Level of assistance: SBA Comments: Practiced ambulating around clinic w/heel strike bilaterally following neural priming from eccentric heel taps and pt able to demonstrate heel strike and increased step length without verbal cues.    PATIENT EDUCATION: Education details: Additions to HEP  Person educated: Patient and Spouse Education method: Explanation, Demonstration, and Verbal cues Education comprehension: verbalized understanding, returned demonstration, verbal cues required, and needs further education   HOME EXERCISE PROGRAM: Access Code: [email or web address] URL: [email or web address] Date: 11/26/2021 Prepared by: Mickie Bail Ainsleigh Kakos  Program Notes Sit to Stand Transfers: 1. Scoot out to the edge of [place]. Place your feet flat on the floor, shoulder width apart.  Make sure your feet are tucked just under your knees.3. Lean forward (nose over toes) with momentum, and stand up tall with your best posture.  If you need to use your arms, use them as a quick boost up to stand.4. If you are in a low or soft chair, you can lean back and then forward up to stand, in order to get more momentum.5. Once you are standing, make sure you are looking ahead and standing tall. To sit down: 1. Back up until you feel the chair behind your legs.2. Bend at you hips, reaching  Back for you chair, if needed, then slowly squat to sit down on your chair.  Exercises - Side Stepping with Counter Support  - 2 x daily - 5 x weekly - 2 sets - 10 reps - Sit to Stand with Armchair  - 1 x daily - 5 x weekly - 2 sets - 10 reps - Alternating Step Forward with Support  - 2 x daily - 5 x weekly - 2 sets - 10 reps - Seated Hamstring Stretch  - 1-2 x daily - 5 x weekly - 3 sets - 30 hold - Standing March with  Counter Support  - 1 x daily - 7 x weekly - 3 sets - 10 reps - Toe Raise With Back Against Wall  - 1 x daily - 7 x weekly - 3 sets - 10 reps - Standing Anterior Toe Taps  - 1 x daily - 7 x weekly - 3 sets - 10 reps   GOALS: Goals reviewed with patient? Yes  SHORT TERM GOALS: Target date: 11/17/2021  Pt will perform initial HEP with supervision from pt's spouse for strength, balance, and transfers in order to build upon functional gains made in therapy. Baseline: Will need review from previous HEP Goal status: MET  2.  Pt and pt's spouse will verbalize understanding of techniques to help with freezing/festination episodes in order to demo improved safety with gait. Baseline: Will need further review.  Goal status: MET  3.  Pt will improve gait speed with RW vs. U step rollator to at least 1.3 ft/sec in order to demo improved community mobility.   Baseline: 30.28 seconds with RW = 1.08 ft/sec; 2.25 ft/s w/ U-step  Goal status: MET  4.  Pt will improve TUG time to 34 seconds or less with RW vs. U-step walker in order to demo decrease fall risk, improved step length and improved ability to turn  Baseline: 40.84 seconds with RW, pt taking incr time to turn; 21.41s w/U-step, no freezing noted  Goal status: MET    LONG  TERM GOALS: Target date: 12/15/2021  Pt will perform final HEP with supervision from pt's spouse for strength, balance, and transfers in order to build upon functional gains made in therapy Baseline:  Goal status: INITIAL  2.  Pt will perform 5x sit <> stand in 17.5 seconds or less with UE support with proper technique and no BLE bracing in order to demo improved functional transfers and decr fall risk.  Baseline: 19.62 seconds Goal status: INITIAL  3.  Pt will improve gait speed with RW vs. U step rollator to at least 2.5 ft/sec in order to demo improved community mobility.  Baseline:  30.28 seconds with RW = 1.08 ft/sec; 2.25 ft/s w/U-step on 7/5  Goal status:  REVISED  4.  Pt will improve 3MWT distance to at least 280' in order to demo improved gait efficiency and taking larger stride length in order to demo improved community/household mobility.  Baseline: 104' with RW Goal status: INITIAL  5.   Pt and pt's spouse will verbalize understanding of fall prevention in the home.  Baseline:  Goal status: INITIAL  6.  Pt will improve TUG time to 18 seconds or less with RW vs. U-step walker in order to demo decrease fall risk, improved step length and improved ability to turn Baseline: 40.84 seconds with RW, pt taking incr time to turn; 21.41s w/U-step on 7/5  Goal status: REVISED  ASSESSMENT:  CLINICAL IMPRESSION: 10th Visit PN: Emphasis of skilled PT session on neural priming to facilitate heel strike bilaterally w/gait training. Pt able to ambulate >300' while demonstrating heel strike bilaterally without need for external cues following eccentric heel taps from 6" step. Added heel taps to HEP and educated pt and wife on performing 2-4 heel taps to laser on U-step prior to walking to prime for heel strike bilaterally for increased safety w/gait. Continue POC.     OBJECTIVE IMPAIRMENTS Abnormal gait, decreased activity tolerance, decreased balance, decreased coordination, decreased endurance, decreased knowledge of use of DME, decreased ROM, decreased strength, decreased safety awareness, impaired flexibility, and postural dysfunction.   ACTIVITY LIMITATIONS carrying, lifting, bending, standing, stairs, transfers, bed mobility, reach over head, and locomotion level  PARTICIPATION LIMITATIONS: cleaning, laundry, medication management, community activity, and yard work  PERSONAL FACTORS Age, Behavior pattern, Past/current experiences, Time since onset of injury/illness/exacerbation, and 3+ comorbidities: PD (diagnosed in 2012), CAD (s/p CABG 2006), HTN, a fib, pacemaker, GERD, on anticoagulants   are also affecting patient's functional outcome.    REHAB POTENTIAL: Good  CLINICAL DECISION MAKING: Evolving/moderate complexity  EVALUATION COMPLEXITY: Moderate  PLAN: PT FREQUENCY: 2x/week  PT DURATION: 12 weeks  PLANNED INTERVENTIONS: Therapeutic exercises, Therapeutic activity, Neuromuscular re-education, Balance training, Gait training, Patient/Family education, and DME instructions  PLAN FOR NEXT SESSION: SciFit, Continue to work on stepping strategies and weight shifting posteriorly. SLS tasks, larger amplitude movement patterns. Work on Personnel officer with U-Step Environmental consultant with use of laser and try going around obstacles. Work on turns with U-Step, ball kicks, rollator outside, anything to facilitate heel strike w/gait  Cruzita Lederer Traylon Schimming, PT, DPT  11/26/2021, 10:15 AM

## 2021-11-28 ENCOUNTER — Ambulatory Visit: Payer: PPO | Admitting: Physical Therapy

## 2021-11-28 DIAGNOSIS — R2681 Unsteadiness on feet: Secondary | ICD-10-CM | POA: Diagnosis not present

## 2021-11-28 DIAGNOSIS — R293 Abnormal posture: Secondary | ICD-10-CM

## 2021-11-28 DIAGNOSIS — R2689 Other abnormalities of gait and mobility: Secondary | ICD-10-CM

## 2021-11-28 DIAGNOSIS — M6281 Muscle weakness (generalized): Secondary | ICD-10-CM

## 2021-11-28 NOTE — Therapy (Signed)
OUTPATIENT PHYSICAL THERAPY NEURO TREATMENT   Patient Name: Donald [name] MRN: [id number] DOB:1940-07-30, 81 y.o., male Today's Date: 11/28/2021   PCP: Donald Pao, MD REFERRING PROVIDER: Ludwig Clarks, [name]     PT End of Session - 11/28/21 1049     Visit Number 11    Number of Visits 17    Date for PT Re-Evaluation 01/18/22   due to potential delay in scheduling   Authorization Type HTA    PT Start Time 1045    PT Stop Time 1131    PT Time Calculation (min) 46 min    Activity Tolerance Patient tolerated treatment well    Behavior During Therapy [name] for tasks assessed/performed;Impulsive                    Past Medical History:  Diagnosis Date   Abnormality of gait 08/27/2014   Anxiety    Aortic stenosis    s/p AVR by Dr Donald [name]   Arthritis    left wrist- probable- not diagonised   Asthma    in the past   CAD (coronary artery disease)    s/p CABG 2006   GERD (gastroesophageal reflux disease)    H/O seasonal allergies    H/O: rheumatic fever    Hearing deficit    Bilateral hearing aids   Hematuria    had CT scan   Hiatal hernia    History of kidney stones    HOH (hard of hearing)    Hyperlipidemia    Hypertension    Hyperthyroidism    Inguinal hernia    right side; watching at this time per wife's report   Memory difficulty 08/27/2014   Pacemaker    Parkinson's disease    Persistent atrial fibrillation ([name])    Scarlet fever    as a child   Shortness of breath    Sick sinus syndrome ([facility])    s/p PPM (MDT) 2006   Past Surgical History:  Procedure Laterality Date   AORTIC VALVE REPLACEMENT  2006   CARDIOVERSION N/A 09/14/2013   Procedure: CARDIOVERSION;  Surgeon: Donald Klein, MD;  Location: [name];  Service: Cardiovascular;  Laterality: N/A;   CHOLECYSTECTOMY N/A 07/21/2013   Procedure: LAPAROSCOPIC CHOLECYSTECTOMY;  Surgeon: Donald Jolly, MD;  Location: [name];  Service: General;  Laterality: N/A;   COLONOSCOPY W/  POLYPECTOMY     COLONOSCOPY WITH PROPOFOL N/A 08/31/2016   Procedure: COLONOSCOPY WITH PROPOFOL;  Surgeon: Donald Corner, MD;  Location: [name];  Service: [name];  Laterality: N/A;   CORONARY ARTERY BYPASS GRAFT  2006   DENTAL SURGERY     implants   INSERT / REPLACE / REMOVE PACEMAKER  2006, 2012   MDT for sick sinus syndrome, gen change 8/12 by Donald [name]   Patient Active Problem List   Diagnosis Date Noted   HOH (hard of hearing) 12/18/2020   Personal history of colonic polyps 08/31/2016   Abnormality of gait 08/27/2014   Memory difficulty 08/27/2014   Permanent atrial fibrillation ([name]) 09/08/2013   Cholelithiases 07/19/2013   Cholelithiasis 07/19/2013   Encounter for therapeutic drug monitoring 07/03/2013   Mechanical aortic valve 04/05/2013   Parkinson disease ([place]) 09/27/2012   Orthostatic hypotension 04/01/2012   Pacemaker-[employer] 03/29/2012   HEPATITIS A 06/04/2010   HYPERTHYROIDISM 06/04/2010   HYPERLIPIDEMIA 06/04/2010   Essential hypertension 06/04/2010   Coronary artery disease involving native coronary artery of native heart without angina pectoris 06/04/2010  SICK SINUS SYNDROME 06/04/2010   HIATAL HERNIA 06/04/2010   SHORTNESS OF BREATH 06/04/2010   CHEST PAIN 06/04/2010    ONSET DATE: 09/25/2021  REFERRING DIAG: G20 (ICD-10-CM) - Parkinson's disease (Donald [name])  THERAPY DIAG:  Unsteadiness on feet  Other abnormalities of gait and mobility  Muscle weakness (generalized)  Abnormal posture  Rationale for Evaluation and Treatment Rehabilitation  SUBJECTIVE:                                                                                                                                                                                              SUBJECTIVE STATEMENT: Pt had one fall since last visit, was opening pantry door and fell backwards. No injuries. Otherwise no new changes.   Pt accompanied by: significant other  Wife [name]  PERTINENT HISTORY: PD (diagnosed in 2012), CAD (s/p CABG 2006), HTN, a fib, pacemaker, GERD, on anticoagulants   PAIN:  Are you having pain? No  PRECAUTIONS: Fall, ICD/Pacemaker, and Other: Wears a helmet, elbow and knees pads to help with falling, pt very HOH  PLOF: Independent with basic ADLs, Independent with household mobility with device, and Leisure: being outside in the yard, watches TV  PATIENT GOALS Pt and pt's wife want to work on pt getting off of his toes when walking and taking bigger steps.    OBJECTIVE:   TODAY'S TREATMENT: NMR  In // bars for increased step length, step clearance, lateral weight shifting and facilitation of heel strike:  -Fwd 4" hurdle navigation using step-through pattern, down and back x5 w/BUE support. Min cues to land w/heel, noted significant difficulty when landing w/RLE > LLE.  -Progressed to lateral hurdle navigation, x5 down and back. Min cues to allow room to bring second foot over hurdle, pt had more difficulty motor planning when leading w/RLE compared to LLE.   Ther Ex  SciFit multi-peaks level 12 for 10 minutes using BUE/BLEs for dynamic cardiovascular conditioning, neural priming for UE/LE reciprocal movement, bilateral calf stretch to facilitate heel strike w/gait and endurance. RPE of 10/10 following activity.    Gait Training  Gait pattern: step to pattern, decreased step length- Right, decreased step length- Left, decreased stance time- Right, decreased stride length, decreased hip/knee flexion- Right, decreased hip/knee flexion- Left, decreased ankle dorsiflexion- Right, decreased ankle dorsiflexion- Left, shuffling, festinating, trunk flexed, narrow BOS, poor foot clearance- Right, and poor foot clearance- Left Distance walked: >300' inside clinic around front desk and in hallway to trigger freezing Assistive device utilized:  U-step Level of assistance: SBA Comments: Pt ambulated two laps from // bars, around front  desk and back to practice taking  large steps, freezing strategies and facilitating heel strike on IC. On first lap, pt required min cues to maintain large step length and was able to facilitate freezing techniques independently (stopping and resetting). Noted narrow spaces, turns and people trigger pt's freezing. On second lap, pt required mod verbal cues for adequate step length and freezing strategies, likely due to fatigue and distraction. Had pt pause and perform x7 heel taps per side to laser on U-step prior to continuing gait training to prime heel strike movement, but pt continued to regress to shuffling gait.    PATIENT EDUCATION: Education details: Continue HEP, performing heel taps prior to gait.  Person educated: Patient and Spouse Education method: Explanation, Demonstration, and Verbal cues Education comprehension: verbalized understanding, returned demonstration, verbal cues required, and needs further education   HOME EXERCISE PROGRAM: Access Code: [email or web address] URL: [email or web address] Date: 11/26/2021 Prepared by: Mickie Bail Nyan Dufresne  Program Notes Sit to Stand Transfers: 1. Scoot out to the edge of [place]. Place your feet flat on the floor, shoulder width apart.  Make sure your feet are tucked just under your knees.3. Lean forward (nose over toes) with momentum, and stand up tall with your best posture.  If you need to use your arms, use them as a quick boost up to stand.4. If you are in a low or soft chair, you can lean back and then forward up to stand, in order to get more momentum.5. Once you are standing, make sure you are looking ahead and standing tall. To sit down: 1. Back up until you feel the chair behind your legs.2. Bend at you hips, reaching  Back for you chair, if needed, then slowly squat to sit down on your chair.  Exercises - Side Stepping with Counter Support  - 2 x daily - 5 x weekly - 2 sets - 10 reps - Sit to Stand with Armchair  - 1 x daily - 5 x  weekly - 2 sets - 10 reps - Alternating Step Forward with Support  - 2 x daily - 5 x weekly - 2 sets - 10 reps - Seated Hamstring Stretch  - 1-2 x daily - 5 x weekly - 3 sets - 30 hold - Standing March with Counter Support  - 1 x daily - 7 x weekly - 3 sets - 10 reps - Toe Raise With Back Against Wall  - 1 x daily - 7 x weekly - 3 sets - 10 reps - Standing Anterior Toe Taps  - 1 x daily - 7 x weekly - 3 sets - 10 reps   GOALS: Goals reviewed with patient? Yes  SHORT TERM GOALS: Target date: 11/17/2021  Pt will perform initial HEP with supervision from pt's spouse for strength, balance, and transfers in order to build upon functional gains made in therapy. Baseline: Will need review from previous HEP Goal status: MET  2.  Pt and pt's spouse will verbalize understanding of techniques to help with freezing/festination episodes in order to demo improved safety with gait. Baseline: Will need further review.  Goal status: MET  3.  Pt will improve gait speed with RW vs. U step rollator to at least 1.3 ft/sec in order to demo improved community mobility.   Baseline: 30.28 seconds with RW = 1.08 ft/sec; 2.25 ft/s w/ U-step  Goal status: MET  4.  Pt will improve TUG time to 34 seconds or less with RW vs. U-step walker in order to demo decrease fall risk, improved step length and  improved ability to turn  Baseline: 40.84 seconds with RW, pt taking incr time to turn; 21.41s w/U-step, no freezing noted  Goal status: MET    LONG TERM GOALS: Target date: 12/15/2021  Pt will perform final HEP with supervision from pt's spouse for strength, balance, and transfers in order to build upon functional gains made in therapy Baseline:  Goal status: INITIAL  2.  Pt will perform 5x sit <> stand in 17.5 seconds or less with UE support with proper technique and no BLE bracing in order to demo improved functional transfers and decr fall risk.  Baseline: 19.62 seconds Goal status: INITIAL  3.  Pt will  improve gait speed with RW vs. U step rollator to at least 2.5 ft/sec in order to demo improved community mobility.  Baseline:  30.28 seconds with RW = 1.08 ft/sec; 2.25 ft/s w/U-step on 7/5  Goal status: REVISED  4.  Pt will improve 3MWT distance to at least 280' in order to demo improved gait efficiency and taking larger stride length in order to demo improved community/household mobility.  Baseline: 86' with RW Goal status: INITIAL  5.   Pt and pt's spouse will verbalize understanding of fall prevention in the home.  Baseline:  Goal status: INITIAL  6.  Pt will improve TUG time to 18 seconds or less with RW vs. U-step walker in order to demo decrease fall risk, improved step length and improved ability to turn Baseline: 40.84 seconds with RW, pt taking incr time to turn; 21.41s w/U-step on 7/5  Goal status: REVISED  ASSESSMENT:  CLINICAL IMPRESSION: Skilled PT session continued to focus on neural priming to facilitate heel strike bilaterally w/gait training. Pt continues to require mod-max verbal cues to facilitate heel strike w/IC during gait, RLE > LLE. Pt extremely fatigued following SciFit, requiring lengthy seated rest break prior to being able to walk out to car. Continue POC.    OBJECTIVE IMPAIRMENTS Abnormal gait, decreased activity tolerance, decreased balance, decreased coordination, decreased endurance, decreased knowledge of use of DME, decreased ROM, decreased strength, decreased safety awareness, impaired flexibility, and postural dysfunction.   ACTIVITY LIMITATIONS carrying, lifting, bending, standing, stairs, transfers, bed mobility, reach over head, and locomotion level  PARTICIPATION LIMITATIONS: cleaning, laundry, medication management, community activity, and yard work  PERSONAL FACTORS Age, Behavior pattern, Past/current experiences, Time since onset of injury/illness/exacerbation, and 3+ comorbidities: PD (diagnosed in 2012), CAD (s/p CABG 2006), HTN, a fib,  pacemaker, GERD, on anticoagulants   are also affecting patient's functional outcome.   REHAB POTENTIAL: Good  CLINICAL DECISION MAKING: Evolving/moderate complexity  EVALUATION COMPLEXITY: Moderate  PLAN: PT FREQUENCY: 2x/week  PT DURATION: 12 weeks  PLANNED INTERVENTIONS: Therapeutic exercises, Therapeutic activity, Neuromuscular re-education, Balance training, Gait training, Patient/Family education, and DME instructions  PLAN FOR NEXT SESSION: SciFit, Continue to work on stepping strategies and weight shifting posteriorly. SLS tasks, larger amplitude movement patterns. Work on Personnel officer with U-Step Environmental consultant with use of laser and try going around obstacles. Work on turns with U-Step, ball kicks, rollator outside, anything to facilitate heel strike w/gait  Cruzita Lederer Davonn Flanery, PT, DPT  11/28/2021, 11:33 AM

## 2021-12-01 ENCOUNTER — Ambulatory Visit: Payer: PPO | Admitting: Podiatry

## 2021-12-01 ENCOUNTER — Ambulatory Visit: Payer: PPO | Admitting: Physical Therapy

## 2021-12-01 DIAGNOSIS — R2681 Unsteadiness on feet: Secondary | ICD-10-CM | POA: Diagnosis not present

## 2021-12-01 DIAGNOSIS — R2689 Other abnormalities of gait and mobility: Secondary | ICD-10-CM

## 2021-12-01 DIAGNOSIS — B351 Tinea unguium: Secondary | ICD-10-CM | POA: Diagnosis not present

## 2021-12-01 DIAGNOSIS — R293 Abnormal posture: Secondary | ICD-10-CM

## 2021-12-01 DIAGNOSIS — M79676 Pain in unspecified toe(s): Secondary | ICD-10-CM

## 2021-12-01 DIAGNOSIS — M6281 Muscle weakness (generalized): Secondary | ICD-10-CM

## 2021-12-01 NOTE — Progress Notes (Signed)
   SUBJECTIVE Patient PMHx Parkinson's disease nonambulatory presents to office today with his spouse complaining of elongated, thickened nails that cause pain while ambulating in shoes.  Patient is unable to trim their own nails. Patient is here for further evaluation and treatment.  Past Medical History:  Diagnosis Date   Abnormality of gait 08/27/2014   Anxiety    Aortic stenosis    s/p AVR by Dr Laneta Simmers   Arthritis    left wrist- probable- not diagonised   Asthma    in the past   CAD (coronary artery disease)    s/p CABG 2006   GERD (gastroesophageal reflux disease)    H/O seasonal allergies    H/O: rheumatic fever    Hearing deficit    Bilateral hearing aids   Hematuria    had CT scan   Hiatal hernia    History of kidney stones    HOH (hard of hearing)    Hyperlipidemia    Hypertension    Hyperthyroidism    Inguinal hernia    right side; watching at this time per wife's report   Memory difficulty 08/27/2014   Pacemaker    Parkinson's disease    Persistent atrial fibrillation ([facility])    Scarlet fever    as a child   Shortness of breath    Sick sinus syndrome ([facility])    s/p PPM (MDT) 2006    OBJECTIVE General Patient is awake, alert, and oriented x 3 and in no acute distress. Derm Skin is dry and supple bilateral. Negative open lesions or macerations. Remaining integument unremarkable. Nails are tender, long, thickened and dystrophic with subungual debris, consistent with onychomycosis, 1-5 bilateral. No signs of infection noted. Vasc  DP and PT pedal pulses palpable bilaterally. Temperature gradient within normal limits.  Neuro Epicritic and protective threshold sensation grossly intact bilaterally.  Musculoskeletal Exam No symptomatic pedal deformities noted bilateral. Muscular strength within normal limits.  ASSESSMENT 1.  Pain due to onychomycosis of toenails both  PLAN OF CARE 1. Patient evaluated today.  2. Instructed to maintain good pedal hygiene and foot  care.  3. Mechanical debridement of nails 1-5 bilaterally performed using a nail nipper. Filed with dremel without incident.  4. Return to clinic in 3 mos.    Felecia Shelling, DPM [facility]  Dr. Felecia Shelling, DPM    [address] [place], [place] [address]                Office [phone]  Fax [phone]

## 2021-12-01 NOTE — Therapy (Signed)
OUTPATIENT PHYSICAL THERAPY NEURO TREATMENT   Patient Name: Donald [name] MRN: [id number] DOB:12-30-1940, 81 y.o., male 22 Date: 12/01/2021   PCP: Donald Pao, MD REFERRING PROVIDER: Ludwig Clarks, DO     PT End of Session - 12/01/21 1021     Visit Number 12    Number of Visits 17    Date for PT Re-Evaluation 01/18/22   due to potential delay in scheduling   Authorization Type HTA    PT Start Time 1018    PT Stop Time 1100    PT Time Calculation (min) 42 min    Activity Tolerance Patient tolerated treatment well    Behavior During Therapy Donald [name] for tasks assessed/performed;Impulsive                     Past Medical History:  Diagnosis Date   Abnormality of gait 08/27/2014   Anxiety    Aortic stenosis    s/p AVR by Dr Donald [name]   Arthritis    left wrist- probable- not diagonised   Asthma    in the past   CAD (coronary artery disease)    s/p CABG 2006   GERD (gastroesophageal reflux disease)    H/O seasonal allergies    H/O: rheumatic fever    Hearing deficit    Bilateral hearing aids   Hematuria    had CT scan   Hiatal hernia    History of kidney stones    HOH (hard of hearing)    Hyperlipidemia    Hypertension    Hyperthyroidism    Inguinal hernia    right side; watching at this time per wife's report   Memory difficulty 08/27/2014   Pacemaker    Parkinson's disease    Persistent atrial fibrillation ([place])    Scarlet fever    as a child   Shortness of breath    Sick sinus syndrome ([place])    s/p PPM (MDT) 2006   Past Surgical History:  Procedure Laterality Date   AORTIC VALVE REPLACEMENT  2006   CARDIOVERSION N/A 09/14/2013   Procedure: CARDIOVERSION;  Surgeon: Donald Klein, MD;  Location: [facility];  Service: Cardiovascular;  Laterality: N/A;   CHOLECYSTECTOMY N/A 07/21/2013   Procedure: LAPAROSCOPIC CHOLECYSTECTOMY;  Surgeon: Donald Jolly, MD;  Location: [facility];  Service: General;  Laterality: N/A;   COLONOSCOPY W/  POLYPECTOMY     COLONOSCOPY WITH PROPOFOL N/A 08/31/2016   Procedure: COLONOSCOPY WITH PROPOFOL;  Surgeon: Donald Corner, MD;  [facility];  Service: Endoscopy;  Laterality: N/A;   CORONARY ARTERY BYPASS GRAFT  2006   DENTAL SURGERY     implants   INSERT / REPLACE / REMOVE PACEMAKER  2006, 2012   MDT for sick sinus syndrome, gen change 8/12 by Donald [name]   Patient Active Problem List   Diagnosis Date Noted   HOH (hard of hearing) 12/18/2020   Personal history of colonic polyps 08/31/2016   Abnormality of gait 08/27/2014   Memory difficulty 08/27/2014   Permanent atrial fibrillation ([place]) 09/08/2013   Cholelithiases 07/19/2013   Cholelithiasis 07/19/2013   Encounter for therapeutic drug monitoring 07/03/2013   Mechanical aortic valve 04/05/2013   Parkinson disease ([place]) 09/27/2012   Orthostatic hypotension 04/01/2012   Pacemaker-[employer]tr[employer] 03/29/2012   HEPATITIS A 06/04/2010   HYPERTHYROIDISM 06/04/2010   HYPERLIPIDEMIA 06/04/2010   Essential hypertension 06/04/2010   Coronary artery disease involving native coronary artery of native heart without angina pectoris  06/04/2010   SICK SINUS SYNDROME 06/04/2010   HIATAL HERNIA 06/04/2010   SHORTNESS OF BREATH 06/04/2010   CHEST PAIN 06/04/2010    ONSET DATE: 09/25/2021  REFERRING DIAG: G20 (ICD-10-CM) - Parkinson's disease ([place])  THERAPY DIAG:  Unsteadiness on feet  Other abnormalities of gait and mobility  Muscle weakness (generalized)  Abnormal posture  Rationale for Evaluation and Treatment Rehabilitation  SUBJECTIVE:                                                                                                                                                                                              SUBJECTIVE STATEMENT: Pt had one fall since last visit, was sitting on a towel on the commode seat and was leaning forward to put socks on and slipped forward into the floor. No  injuries reported. Per wife, has not been practicing walking.   Pt accompanied by: significant other Wife Donald [name]  PERTINENT HISTORY: PD (diagnosed in 2012), CAD (s/p CABG 2006), HTN, a fib, pacemaker, GERD, on anticoagulants   PAIN:  Are you having pain? No  PRECAUTIONS: Fall, ICD/Pacemaker, and Other: Wears a helmet, elbow and knees pads to help with falling, pt very HOH  PLOF: Independent with basic ADLs, Independent with household mobility with device, and Leisure: being outside in the yard, watches TV  PATIENT GOALS Pt and pt's wife want to work on pt getting off of his toes when walking and taking bigger steps.    OBJECTIVE:   TODAY'S TREATMENT: Self-care/home management  Pt's wife reporting pt does not like the rollator and is confused regarding its purpose. Lengthy discussion regarding using the rollator to walk outdoors to his shed, as pt currently using RW which is very unsafe. Emphasized how the rollator is not a replacement for U-step walker and reviewed proper management of rollator vs U-step. Pt able to teach back differences between rollator and U-step and stated he would practice rollator at home prior to next session.   Educated pt on safety risk w/sitting on towel on toilet seat. Pt stated he does this often, so therapist provided detailed and lengthy explanation as to why sitting on slick surfaces are not safe. Encouraged pt to sit directly on toilet seat instead, but pt did not agree.   Continued to emphasize importance of practicing proper walking (heel-toe, increased step length) at home for increased carryover and improved safety w/gait. Pt seemingly confused as to what "practicing good walking" means, provided multimodal cues but unsure if pt understood. Encouraged pt to practice "good" walking every single time he walks, as walking is not as natural for him now and  he must think about taking large steps every time he walks for improved carryover. Pt verbalized  understanding   NMR  -Sit <>stands w/blue wedge placed under feet to bias posterior lean and weight on heels, x15 reps w/BUE support. Provided max concurrent visual and verbal cues to facilitate powerful anterior weight shifting and proper body mechanics. Pt unable to perform without UE support and required CGA-min A to stabilize once standing on wedge as pt would fall backwards without support. Max cues to shift weight forward in stance, but pt only able to hold balance twice without external support.    Gait Training  Gait pattern: step to pattern, decreased step length- Right, decreased step length- Left, decreased stance time- Right, decreased stride length, decreased hip/knee flexion- Right, decreased hip/knee flexion- Left, decreased ankle dorsiflexion- Right, decreased ankle dorsiflexion- Left, shuffling, festinating, trunk flexed, narrow BOS, poor foot clearance- Right, and poor foot clearance- Left Distance walked: >200' inside clinic around  hallway to trigger freezing Assistive device utilized:  U-step Level of assistance: SBA Comments: Pt ambulated around gym and up/down front desk hallway to practice proper heel-toe walking and freezing strategies. Pt required mod verbal cues to facilitate heel strike w/gait but was able to reset during freezing episodes without cues.    PATIENT EDUCATION: Education details: See self-care section  Person educated: Patient and Spouse Education method: Explanation, Demonstration, and Verbal cues Education comprehension: verbalized understanding, returned demonstration, verbal cues required, and needs further education   HOME EXERCISE PROGRAM: Access Code: [email or web address] URL: [email or web address] Date: 11/26/2021 Prepared by: Mickie Bail   Program Notes Sit to Stand Transfers: 1. Scoot out to the edge of [place]. Place your feet flat on the floor, shoulder width apart.  Make sure your feet are tucked just under your knees.3. Lean  forward (nose over toes) with momentum, and stand up tall with your best posture.  If you need to use your arms, use them as a quick boost up to stand.4. If you are in a low or soft chair, you can lean back and then forward up to stand, in order to get more momentum.5. Once you are standing, make sure you are looking ahead and standing tall. To sit down: 1. Back up until you feel the chair behind your legs.2. Bend at you hips, reaching  Back for you chair, if needed, then slowly squat to sit down on your chair.  Exercises - Side Stepping with Counter Support  - 2 x daily - 5 x weekly - 2 sets - 10 reps - Sit to Stand with Armchair  - 1 x daily - 5 x weekly - 2 sets - 10 reps - Alternating Step Forward with Support  - 2 x daily - 5 x weekly - 2 sets - 10 reps - Seated Hamstring Stretch  - 1-2 x daily - 5 x weekly - 3 sets - 30 hold - Standing March with Counter Support  - 1 x daily - 7 x weekly - 3 sets - 10 reps - Toe Raise With Back Against Wall  - 1 x daily - 7 x weekly - 3 sets - 10 reps - Standing Anterior Toe Taps  - 1 x daily - 7 x weekly - 3 sets - 10 reps   GOALS: Goals reviewed with patient? Yes  SHORT TERM GOALS: Target date: 11/17/2021  Pt will perform initial HEP with supervision from pt's spouse for strength, balance, and transfers in order to build upon functional gains made in therapy. Baseline:  Will need review from previous HEP Goal status: MET  2.  Pt and pt's spouse will verbalize understanding of techniques to help with freezing/festination episodes in order to demo improved safety with gait. Baseline: Will need further review.  Goal status: MET  3.  Pt will improve gait speed with RW vs. U step rollator to at least 1.3 ft/sec in order to demo improved community mobility.   Baseline: 30.28 seconds with RW = 1.08 ft/sec; 2.25 ft/s w/ U-step  Goal status: MET  4.  Pt will improve TUG time to 34 seconds or less with RW vs. U-step walker in order to demo decrease fall  risk, improved step length and improved ability to turn  Baseline: 40.84 seconds with RW, pt taking incr time to turn; 21.41s w/U-step, no freezing noted  Goal status: MET    LONG TERM GOALS: Target date: 12/15/2021  Pt will perform final HEP with supervision from pt's spouse for strength, balance, and transfers in order to build upon functional gains made in therapy Baseline:  Goal status: INITIAL  2.  Pt will perform 5x sit <> stand in 17.5 seconds or less with UE support with proper technique and no BLE bracing in order to demo improved functional transfers and decr fall risk.  Baseline: 19.62 seconds Goal status: INITIAL  3.  Pt will improve gait speed with RW vs. U step rollator to at least 2.5 ft/sec in order to demo improved community mobility.  Baseline:  30.28 seconds with RW = 1.08 ft/sec; 2.25 ft/s w/U-step on 7/5  Goal status: REVISED  4.  Pt will improve 3MWT distance to at least 280' in order to demo improved gait efficiency and taking larger stride length in order to demo improved community/household mobility.  Baseline: 24' with RW Goal status: INITIAL  5.   Pt and pt's spouse will verbalize understanding of fall prevention in the home.  Baseline:  Goal status: INITIAL  6.  Pt will improve TUG time to 18 seconds or less with RW vs. U-step walker in order to demo decrease fall risk, improved step length and improved ability to turn Baseline: 40.84 seconds with RW, pt taking incr time to turn; 21.41s w/U-step on 7/5  Goal status: REVISED  ASSESSMENT:  CLINICAL IMPRESSION: Emphasis of skilled PT session on patient education, anterior weight shifting and continued heel-strike bias w/gait. Pt's wife reports noncompliance w/HEP at home, lengthy discussion regarding safety and practicing proper gait can be read in self care-section. Pt unable to perform sit <>stands w/posterior bias and no UE support even w/tactile cues provided for high amplitude weight shift. Pt  continues to require neural priming prior to gait training to facilitate heel strike. Continue POC.    OBJECTIVE IMPAIRMENTS Abnormal gait, decreased activity tolerance, decreased balance, decreased coordination, decreased endurance, decreased knowledge of use of DME, decreased ROM, decreased strength, decreased safety awareness, impaired flexibility, and postural dysfunction.   ACTIVITY LIMITATIONS carrying, lifting, bending, standing, stairs, transfers, bed mobility, reach over head, and locomotion level  PARTICIPATION LIMITATIONS: cleaning, laundry, medication management, community activity, and yard work  PERSONAL FACTORS Age, Behavior pattern, Past/current experiences, Time since onset of injury/illness/exacerbation, and 3+ comorbidities: PD (diagnosed in 2012), CAD (s/p CABG 2006), HTN, a fib, pacemaker, GERD, on anticoagulants   are also affecting patient's functional outcome.   REHAB POTENTIAL: Good  CLINICAL DECISION MAKING: Evolving/moderate complexity  EVALUATION COMPLEXITY: Moderate  PLAN: PT FREQUENCY: 2x/week  PT DURATION: 12 weeks  PLANNED INTERVENTIONS: Therapeutic exercises, Therapeutic activity, Neuromuscular  re-education, Balance training, Gait training, Patient/Family education, and DME instructions  PLAN FOR NEXT SESSION: Updates on rollator? [employer], Continue to work on stepping strategies and weight shifting posteriorly. SLS tasks, larger amplitude movement patterns. Work on Personnel officer with U-Step Environmental consultant with use of laser and try going around obstacles. Work on turns with U-Step, ball kicks, rollator outside, anything to facilitate heel strike w/gait  Cruzita Lederer , PT, DPT  12/01/2021, 11:13 AM

## 2021-12-02 ENCOUNTER — Ambulatory Visit (INDEPENDENT_AMBULATORY_CARE_PROVIDER_SITE_OTHER): Payer: PPO

## 2021-12-02 DIAGNOSIS — Z5181 Encounter for therapeutic drug level monitoring: Secondary | ICD-10-CM

## 2021-12-02 LAB — POCT INR: INR: 2.9 (ref 2.0–3.0)

## 2021-12-02 NOTE — Patient Instructions (Signed)
continue taking 1 tablet daily except 1/2 tablet on Sundays and Thursdays.  Recheck INR in 6 weeks.  Call [phone] call with any new medications or if scheduled for any other procedures.  Clearance/Procedure Fax #[phone]

## 2021-12-03 ENCOUNTER — Ambulatory Visit: Payer: PPO | Admitting: Physical Therapy

## 2021-12-03 ENCOUNTER — Encounter: Payer: Self-pay | Admitting: Physical Therapy

## 2021-12-03 DIAGNOSIS — M6281 Muscle weakness (generalized): Secondary | ICD-10-CM

## 2021-12-03 DIAGNOSIS — R2689 Other abnormalities of gait and mobility: Secondary | ICD-10-CM

## 2021-12-03 DIAGNOSIS — R293 Abnormal posture: Secondary | ICD-10-CM

## 2021-12-03 DIAGNOSIS — R2681 Unsteadiness on feet: Secondary | ICD-10-CM | POA: Diagnosis not present

## 2021-12-03 NOTE — Therapy (Signed)
OUTPATIENT PHYSICAL THERAPY NEURO TREATMENT   Patient Name: Donald [name] MRN: [id number] DOB:[birth year], 81 y.o., male Today's Date: 12/03/2021   PCP: Donald Pao, MD REFERRING PROVIDER: Ludwig Clarks, DO     PT End of Session - 12/03/21 [phone]     Visit Number 13    Number of Visits 17    Date for PT Re-Evaluation 01/18/22   due to potential delay in scheduling   Authorization Type HTA    PT Start Time 0933    PT Stop Time 1015    PT Time Calculation (min) 42 min    Activity Tolerance Patient tolerated treatment well    Behavior During Therapy Donald [name] for tasks assessed/performed;Impulsive                     Past Medical History:  Diagnosis Date   Abnormality of gait 08/27/2014   Anxiety    Aortic stenosis    s/p AVR by Dr Donald [name]   Arthritis    left wrist- probable- not diagonised   Asthma    in the past   CAD (coronary artery disease)    s/p CABG 2006   GERD (gastroesophageal reflux disease)    H/O seasonal allergies    H/O: rheumatic fever    Hearing deficit    Bilateral hearing aids   Hematuria    had CT scan   Hiatal hernia    History of kidney stones    HOH (hard of hearing)    Hyperlipidemia    Hypertension    Hyperthyroidism    Inguinal hernia    right side; watching at this time per wife's report   Memory difficulty 08/27/2014   Pacemaker    Parkinson's disease    Persistent atrial fibrillation (Donald [name])    Scarlet fever    as a child   Shortness of breath    Sick sinus syndrome (Donald [name])    s/p PPM (MDT) 2006   Past Surgical History:  Procedure Laterality Date   AORTIC VALVE REPLACEMENT  2006   CARDIOVERSION N/A 09/14/2013   Procedure: CARDIOVERSION;  Surgeon: Donald Klein, MD;  Location: [facility];  Service: Cardiovascular;  Laterality: N/A;   CHOLECYSTECTOMY N/A 07/21/2013   Procedure: LAPAROSCOPIC CHOLECYSTECTOMY;  Surgeon: Donald Jolly, MD;  Location: [facility];  Service: General;  Laterality: N/A;   COLONOSCOPY W/  POLYPECTOMY     COLONOSCOPY WITH PROPOFOL N/A 08/31/2016   Procedure: COLONOSCOPY WITH PROPOFOL;  Surgeon: Donald Corner, MD;  Location: [name];  Service: Endoscopy;  Laterality: N/A;   CORONARY ARTERY BYPASS GRAFT  2006   DENTAL SURGERY     implants   INSERT / REPLACE / REMOVE PACEMAKER  2006, 2012   MDT for sick sinus syndrome, gen change 8/12 by Donald [name]   Patient Active Problem List   Diagnosis Date Noted   HOH (hard of hearing) 12/18/2020   Personal history of colonic polyps 08/31/2016   Abnormality of gait 08/27/2014   Memory difficulty 08/27/2014   Permanent atrial fibrillation ([place]) 09/08/2013   Cholelithiases 07/19/2013   Cholelithiasis 07/19/2013   Encounter for therapeutic drug monitoring 07/03/2013   Mechanical aortic valve 04/05/2013   Parkinson disease ([place]) 09/27/2012   Orthostatic hypotension 04/01/2012   Pacemaker-Medtronic 03/29/2012   HEPATITIS A 06/04/2010   HYPERTHYROIDISM 06/04/2010   HYPERLIPIDEMIA 06/04/2010   Essential hypertension 06/04/2010   Coronary artery disease involving native coronary artery of native heart without angina pectoris  06/04/2010   SICK SINUS SYNDROME 06/04/2010   HIATAL HERNIA 06/04/2010   SHORTNESS OF BREATH 06/04/2010   CHEST PAIN 06/04/2010    ONSET DATE: 09/25/2021  REFERRING DIAG: G20 (ICD-10-CM) - Parkinson's disease ([place])  THERAPY DIAG:  Unsteadiness on feet  Other abnormalities of gait and mobility  Muscle weakness (generalized)  Abnormal posture  Rationale for Evaluation and Treatment Rehabilitation  SUBJECTIVE:                                                                                                                                                                                              SUBJECTIVE STATEMENT: Had a fall this morning, thinks he put some stuff in his pockets after he turned loose from the waker.   Pt accompanied by: significant other Wife  Donald [name]  PERTINENT HISTORY: PD (diagnosed in 2012), CAD (s/p CABG 2006), HTN, a fib, pacemaker, GERD, on anticoagulants   PAIN:  Are you having pain? No  PRECAUTIONS: Fall, ICD/Pacemaker, and Other: Wears a helmet, elbow and knees pads to help with falling, pt very HOH  PLOF: Independent with basic ADLs, Independent with household mobility with device, and Leisure: being outside in the yard, watches TV  PATIENT GOALS Pt and pt's wife want to work on pt getting off of his toes when walking and taking bigger steps.    OBJECTIVE:   TODAY'S TREATMENT: Self-care/home management  Pt reporting fall this morning - thinks he turned loose from U-Step Rollator when he went to put things in his pockets. Review importance that pt should always have single UE support like on rollator or on countertop and wide BOS for balance or if he does need to use both hands to make sure rollator is in front of him and something solid is behind him like a chair/counter/wall as pt frequently loses his balance backwards.    Pt asking if his BP is contributing to his falls (pt has recently been taking off a BP medication), pt's BP (per wife) has been between 110-120/65-75 at home and he has not been dizzy/lightheaded. Discussed that this probably is not contributing as in the past pt has had lower BP values and had more falls after he was taken off his medication.   Continued to emphasize importance of focusing on "good walking" with increased effort (heel-toe, increased step length) at home for increased carryover and improved safety w/gait and not just when in therapy.   NMR  Sit <>stands from mat table with focus on scooting out towards edge, wide BOS, tucking feet under him and incr forward lean to stand, needs reminder cues for proper  technique x10 reps, plus additional reps during session.   In // bars: Wide BOS and trunk rotations reaching across body laterally and superiorly/laterally to tap cone, x15 reps each  side, max cues for weight shifting and looking at cone and holding position for balance. Min guard at times for balance.  Forwards and retro gait down and back x5 reps with BUE support, with forward gait cued for heel strike with incr stride length with pt able to perform well. With initial rep with retro gait, pt taking 30 steps, cued for larger steps and getting foot fully on the floor to shift back onto heels as pt ambulates backwards on his toes, needs constant cues throughout for technique and to slow down, pt able to improve with weight shift during last rep, but still unable to get foot fully on floor. Pt able to improve from 30 steps > ~10-12 steps.  Forward <> Backwards stepping x20 reps each leg to visual targets colorful floor dots forwards and backwards, pt needs max multimodal cues throughout for technique (keeping stance leg still), weight shifting and step length.  With 4 smaller orange obstacles: stepping over with step to pattern with focus on incr foot clearance and SLS time, pt needing BUE support - attempted some without and pt needing min guard for balance, down and back x2 reps, when turning cued for incr foot clearance/marching when going to face other direction as pt performs with pivoting on his feet.   Gait Training  Gait pattern: step to pattern, decreased step length- Right, decreased step length- Left, decreased stance time- Right, decreased stride length, decreased hip/knee flexion- Right, decreased hip/knee flexion- Left, decreased ankle dorsiflexion- Right, decreased ankle dorsiflexion- Left, shuffling, festinating, trunk flexed, narrow BOS, poor foot clearance- Right, and poor foot clearance- Left Distance walked: 53' plus going in and out of clinic.  Assistive device utilized:  U-step Level of assistance: SBA Comments: Cues for BIG KICKS with big effort to the laser and heel strike throughout. Pt demonstrated improved step length LLE>RLE and heel strike with consistent  cues throughout. Pt with more freezing and festination episodes when another person is walking towards him or in a busy environment. Needs cues to stop and reset and get heels all the way down onto floor before continuing with another big step.   PATIENT EDUCATION: Education details: See self-care section  Person educated: Patient and Spouse Education method: Explanation, Demonstration, and Verbal cues Education comprehension: verbalized understanding, returned demonstration, verbal cues required, and needs further education   HOME EXERCISE PROGRAM: Access Code: [email or web address] URL: [email or web address] Date: 11/26/2021 Prepared by: Mickie Bail Plaster  Program Notes Sit to Stand Transfers: 1. Scoot out to the edge of [place]. Place your feet flat on the floor, shoulder width apart.  Make sure your feet are tucked just under your knees.3. Lean forward (nose over toes) with momentum, and stand up tall with your best posture.  If you need to use your arms, use them as a quick boost up to stand.4. If you are in a low or soft chair, you can lean back and then forward up to stand, in order to get more momentum.5. Once you are standing, make sure you are looking ahead and standing tall. To sit down: 1. Back up until you feel the chair behind your legs.2. Bend at you hips, reaching  Back for you chair, if needed, then slowly squat to sit down on your chair.  Exercises - Side Stepping with Counter Support  - 2  x daily - 5 x weekly - 2 sets - 10 reps - Sit to Stand with Armchair  - 1 x daily - 5 x weekly - 2 sets - 10 reps - Alternating Step Forward with Support  - 2 x daily - 5 x weekly - 2 sets - 10 reps - Seated Hamstring Stretch  - 1-2 x daily - 5 x weekly - 3 sets - 30 hold - Standing March with Counter Support  - 1 x daily - 7 x weekly - 3 sets - 10 reps - Toe Raise With Back Against Wall  - 1 x daily - 7 x weekly - 3 sets - 10 reps - Standing Anterior Toe Taps  - 1 x daily - 7 x weekly -  3 sets - 10 reps   GOALS: Goals reviewed with patient? Yes  SHORT TERM GOALS: Target date: 11/17/2021  Pt will perform initial HEP with supervision from pt's spouse for strength, balance, and transfers in order to build upon functional gains made in therapy. Baseline: Will need review from previous HEP Goal status: MET  2.  Pt and pt's spouse will verbalize understanding of techniques to help with freezing/festination episodes in order to demo improved safety with gait. Baseline: Will need further review.  Goal status: MET  3.  Pt will improve gait speed with RW vs. U step rollator to at least 1.3 ft/sec in order to demo improved community mobility.   Baseline: 30.28 seconds with RW = 1.08 ft/sec; 2.25 ft/s w/ U-step  Goal status: MET  4.  Pt will improve TUG time to 34 seconds or less with RW vs. U-step walker in order to demo decrease fall risk, improved step length and improved ability to turn  Baseline: 40.84 seconds with RW, pt taking incr time to turn; 21.41s w/U-step, no freezing noted  Goal status: MET    LONG TERM GOALS: Target date: 12/15/2021  Pt will perform final HEP with supervision from pt's spouse for strength, balance, and transfers in order to build upon functional gains made in therapy Baseline:  Goal status: INITIAL  2.  Pt will perform 5x sit <> stand in 17.5 seconds or less with UE support with proper technique and no BLE bracing in order to demo improved functional transfers and decr fall risk.  Baseline: 19.62 seconds Goal status: INITIAL  3.  Pt will improve gait speed with RW vs. U step rollator to at least 2.5 ft/sec in order to demo improved community mobility.  Baseline:  30.28 seconds with RW = 1.08 ft/sec; 2.25 ft/s w/U-step on 7/5  Goal status: REVISED  4.  Pt will improve 3MWT distance to at least 280' in order to demo improved gait efficiency and taking larger stride length in order to demo improved community/household mobility.  Baseline:  64' with RW Goal status: INITIAL  5.   Pt and pt's spouse will verbalize understanding of fall prevention in the home.  Baseline:  Goal status: INITIAL  6.  Pt will improve TUG time to 18 seconds or less with RW vs. U-step walker in order to demo decrease fall risk, improved step length and improved ability to turn Baseline: 40.84 seconds with RW, pt taking incr time to turn; 21.41s w/U-step on 7/5  Goal status: REVISED  ASSESSMENT:  CLINICAL IMPRESSION: Today's skilled session focused on patient education, weight shifting tasks, stepping and SLS activities. Pt needing max multi-modal cues for proper technique for forward/posterior stepping, esp weight shifting posteriorly and getting weight  onto his heels. Reviewed importance of wife performing HEP with pt at home for cueing and incr carryover. Will continue to progress towards LTGs.    OBJECTIVE IMPAIRMENTS Abnormal gait, decreased activity tolerance, decreased balance, decreased coordination, decreased endurance, decreased knowledge of use of DME, decreased ROM, decreased strength, decreased safety awareness, impaired flexibility, and postural dysfunction.   ACTIVITY LIMITATIONS carrying, lifting, bending, standing, stairs, transfers, bed mobility, reach over head, and locomotion level  PARTICIPATION LIMITATIONS: cleaning, laundry, medication management, community activity, and yard work  PERSONAL FACTORS Age, Behavior pattern, Past/current experiences, Time since onset of injury/illness/exacerbation, and 3+ comorbidities: PD (diagnosed in 2012), CAD (s/p CABG 2006), HTN, a fib, pacemaker, GERD, on anticoagulants   are also affecting patient's functional outcome.   REHAB POTENTIAL: Good  CLINICAL DECISION MAKING: Evolving/moderate complexity  EVALUATION COMPLEXITY: Moderate  PLAN: PT FREQUENCY: 2x/week  PT DURATION: 12 weeks  PLANNED INTERVENTIONS: Therapeutic exercises, Therapeutic activity, Neuromuscular re-education,  Balance training, Gait training, Patient/Family education, and DME instructions  PLAN FOR NEXT SESSION: Updates on rollator? [employer], Continue to work on stepping strategies and weight shifting posteriorly. SLS tasks, larger amplitude movement patterns. Work on Personnel officer with U-Step Environmental consultant with use of laser and try going around obstacles and turns. Rollator outside, anything to facilitate heel strike w/gait  Arliss Journey, PT, DPT  12/03/2021, 11:22 AM

## 2021-12-05 ENCOUNTER — Ambulatory Visit: Payer: PPO | Admitting: Neurology

## 2021-12-05 DIAGNOSIS — K117 Disturbances of salivary secretion: Secondary | ICD-10-CM | POA: Diagnosis not present

## 2021-12-05 MED ORDER — RIMABOTULINUMTOXINB 5000 UNIT/ML IM SOLN
5000.0000 [IU] | Freq: Once | INTRAMUSCULAR | Status: AC
  Administered 2021-12-05: 5000 [IU] via INTRAMUSCULAR

## 2021-12-05 NOTE — Procedures (Signed)
[facility]um [facility]    History:  Diagnosis: Sialorrhea    Result History  N/a  Consent obtained from: The patient The patient was educated on the botulinum toxin the black blox warning and given a copy of the botox patient medication guide.  The patient understands that this warning states that there have been reported cases of the Botox extending beyond the injection site and creating adverse effects, similar to those of botulism. This included loss of strength, trouble walking, hoarseness, trouble saying words clearly, loss of bladder control, trouble breathing, trouble swallowing, diplopia, blurry vision and ptosis. Most of the distant spread of Botox was happening in patients, primarily children, who received medication for spasticity or for cervical dystonia. The patient expressed understanding and desire to proceed.     Injections  Location Left  Right Units Number of sites  Submandibular gland 250 250 500 1 per side  Parotid 2250 2250 2500 1 per side  TOTAL UNITS:     5000      Type of Toxin: Myobloc type B As ordered and injected IM at today's visit Total Units: 5000  Discarded Units: 0  Needle drawback with each injection was free of blood. Pt tolerated procedure well without complications.   Reinjection is anticipated in 3 months.                   

## 2021-12-08 ENCOUNTER — Ambulatory Visit: Payer: PPO | Admitting: Physical Therapy

## 2021-12-08 ENCOUNTER — Encounter: Payer: Self-pay | Admitting: Physical Therapy

## 2021-12-08 DIAGNOSIS — R2681 Unsteadiness on feet: Secondary | ICD-10-CM

## 2021-12-08 DIAGNOSIS — R2689 Other abnormalities of gait and mobility: Secondary | ICD-10-CM

## 2021-12-08 DIAGNOSIS — R293 Abnormal posture: Secondary | ICD-10-CM

## 2021-12-08 DIAGNOSIS — M6281 Muscle weakness (generalized): Secondary | ICD-10-CM

## 2021-12-08 NOTE — Therapy (Signed)
OUTPATIENT PHYSICAL THERAPY NEURO TREATMENT   Patient Name: [name] MRN: [id number] DOB:July 28, 1940, 81 y.o., male Today's Date: 12/08/2021   PCP: Donald Pao, MD REFERRING PROVIDER: Ludwig Clarks, DO     PT End of Session - 12/08/21 1107     Visit Number 14    Number of Visits 17    Date for PT Re-Evaluation 01/18/22   due to potential delay in scheduling   Authorization Type HTA    PT Start Time 1104    PT Stop Time 1145    PT Time Calculation (min) 41 min    Activity Tolerance Patient tolerated treatment well    Behavior During Therapy [name] for tasks assessed/performed;Impulsive                     Past Medical History:  Diagnosis Date   Abnormality of gait 08/27/2014   Anxiety    Aortic stenosis    s/p AVR by Dr [name]   Arthritis    left wrist- probable- not diagonised   Asthma    in the past   CAD (coronary artery disease)    s/p CABG 2006   GERD (gastroesophageal reflux disease)    H/O seasonal allergies    H/O: rheumatic fever    Hearing deficit    Bilateral hearing aids   Hematuria    had CT scan   Hiatal hernia    History of kidney stones    HOH (hard of hearing)    Hyperlipidemia    Hypertension    Hyperthyroidism    Inguinal hernia    right side; watching at this time per wife's report   Memory difficulty 08/27/2014   Pacemaker    Parkinson's disease    Persistent atrial fibrillation ([name])    Scarlet fever    as a child   Shortness of breath    Sick sinus syndrome (Donald [name])    s/p PPM ([name]) 2006   Past Surgical History:  Procedure Laterality Date   AORTIC VALVE REPLACEMENT  2006   CARDIOVERSION N/A 09/14/2013   Procedure: CARDIOVERSION;  Surgeon: Donald Klein, MD;  [facility]: [name];  Service: Cardiovascular;  Laterality: N/A;   CHOLECYSTECTOMY N/A 07/21/2013   Procedure: LAPAROSCOPIC CHOLECYSTECTOMY;  Surgeon: Donald Jolly, MD;  Location: [name];  Service: General;  Laterality: N/A;   COLONOSCOPY W/  POLYPECTOMY     COLONOSCOPY WITH PROPOFOL N/A 08/31/2016   Procedure: COLONOSCOPY WITH PROPOFOL;  Surgeon: Donald Corner, MD;  Location: [name];  Service: Endoscopy;  Laterality: N/A;   CORONARY ARTERY BYPASS GRAFT  2006   DENTAL SURGERY     implants   INSERT / REPLACE / REMOVE PACEMAKER  2006, 2012   [name] for sick sinus syndrome, gen change 8/12 by [place]   Patient Active Problem List   Diagnosis Date Noted   HOH (hard of hearing) 12/18/2020   Personal history of colonic polyps 08/31/2016   Abnormality of gait 08/27/2014   Memory difficulty 08/27/2014   Permanent atrial fibrillation ([name]) 09/08/2013   Cholelithiases 07/19/2013   Cholelithiasis 07/19/2013   Encounter for therapeutic drug monitoring 07/03/2013   Mechanical aortic valve 04/05/2013   Parkinson disease ([name]) 09/27/2012   Orthostatic hypotension 04/01/2012   Pacemaker-[employer] 03/29/2012   HEPATITIS A 06/04/2010   HYPERTHYROIDISM 06/04/2010   HYPERLIPIDEMIA 06/04/2010   Essential hypertension 06/04/2010   Coronary artery disease involving native coronary artery of native heart without angina pectoris  06/04/2010   SICK SINUS SYNDROME 06/04/2010   HIATAL HERNIA 06/04/2010   SHORTNESS OF BREATH 06/04/2010   CHEST PAIN 06/04/2010    ONSET DATE: 09/25/2021  REFERRING DIAG: G20 (ICD-10-CM) - Parkinson's disease (Donald [name])  THERAPY DIAG:  Unsteadiness on feet  Other abnormalities of gait and mobility  Muscle weakness (generalized)  Abnormal posture  Rationale for Evaluation and Treatment Rehabilitation  SUBJECTIVE:                                                                                                                                                                                              SUBJECTIVE STATEMENT: No changes since he was last here. No falls. Got the Botox to help with the drooling last Friday.   Pt accompanied by: significant other Wife  [name]  PERTINENT HISTORY: PD (diagnosed in 2012), CAD (s/p CABG 2006), HTN, a fib, pacemaker, GERD, on anticoagulants   PAIN:  Are you having pain? No  PRECAUTIONS: Fall, ICD/Pacemaker, and Other: Wears a helmet, elbow and knees pads to help with falling, pt very HOH  PLOF: Independent with basic ADLs, Independent with household mobility with device, and Leisure: being outside in the yard, watches TV  PATIENT GOALS Pt and pt's wife want to work on pt getting off of his toes when walking and taking bigger steps.    OBJECTIVE:   TODAY'S TREATMENT:   NMR  Sit <>stands throughout session from mat table and from chair with focus on scooting out towards edge, wide BOS, tucking feet under him and incr forward lean to stand, needs reminder cues for proper technique x10 reps, plus additional reps during session. Pt with tendency to not tuck feet back under him enough or perform with incr forward lean and had one episode of retropulsion where he lost his balance backwards and fell back into chair. Some reps, pt stood onto his heels due to decr forward weight shift. Pt much more steady with forward weight shift and proper technique. Educated on importance of making sure pt is in the proper position each time before he stands.   With // bars:  Lateral posterior stepping to target, with single UE on bars for balance and other UE reaching, cued for large amplitude movements and trying to take a single step back in to the [name], when stepping with LLE, pt with tendency to take smaller little steps to bring into [name] vs. One fluid step, performed x15 reps each side. Cued for wider BOS when stepping back into midline.  Alternating SLS taps to floor dots on 6" step as visual cue, x20 reps each leg with focus  on slowed pace, weight shifting and wider BOS, performed with UE support > none with close min guard for balance, pt more challenged by SLS on LLE.  Lateral step overs: performed LLE stepping over 4"  step, beginning with UE support > none x10 reps with close min guard/min A for balance, then repeated with RLE over 2" step (pt needing to step over a smaller obstacle on this side due to incr difficulty)  Gait Training  Gait pattern: step to pattern, decreased step length- Right, decreased step length- Left, decreased stance time- Right, decreased stride length, decreased hip/knee flexion- Right, decreased hip/knee flexion- Left, decreased ankle dorsiflexion- Right, decreased ankle dorsiflexion- Left, shuffling, festinating, trunk flexed, narrow BOS, poor foot clearance- Right, and poor foot clearance- Left Distance walked: 450', plus going into and out of session.  Assistive device utilized:  U-step Level of assistance: SBA Comments: Cues for BIG KICKS with big effort to the laser and heel strike throughout. Continued to educate on purpose of larger steps/heel strike during gait and how pt needs to focus on this at all times when he is walking. Pt demonstrated improved step length LLE>RLE and heel strike with consistent cues throughout. Pt with festination episodes when in a more busy environment or when starting to turn.  Needs cues to stop and reset with tall posture and get heels all the way down onto floor before continuing with another big step. Continued to provide extensive education on purpose of incr heel strike with gait and would rather have pt focus on technique/slowing down then trying to move fast and that pt is more efficient with gait when taking larger steps vs. Smaller shuffled/festinated steps.   PATIENT EDUCATION: Education details: See gait section above.   Person educated: Patient and Spouse Education method: Explanation, Demonstration, and Verbal cues Education comprehension: verbalized understanding, returned demonstration, verbal cues required, and needs further education   HOME EXERCISE PROGRAM: Access Code: [email or web address] URL: [email or web address] Date:  11/26/2021 Prepared by: Mickie Bail Plaster  Program Notes Sit to Stand Transfers: 1. Scoot out to the edge of [place]. Place your feet flat on the floor, shoulder width apart.  Make sure your feet are tucked just under your knees.3. Lean forward (nose over toes) with momentum, and stand up tall with your best posture.  If you need to use your arms, use them as a quick boost up to stand.4. If you are in a low or soft chair, you can lean back and then forward up to stand, in order to get more momentum.5. Once you are standing, make sure you are looking ahead and standing tall. To sit down: 1. Back up until you feel the chair behind your legs.2. Bend at you hips, reaching  Back for you chair, if needed, then slowly squat to sit down on your chair.  Exercises - Side Stepping with Counter Support  - 2 x daily - 5 x weekly - 2 sets - 10 reps - Sit to Stand with Armchair  - 1 x daily - 5 x weekly - 2 sets - 10 reps - Alternating Step Forward with Support  - 2 x daily - 5 x weekly - 2 sets - 10 reps - Seated Hamstring Stretch  - 1-2 x daily - 5 x weekly - 3 sets - 30 hold - Standing March with Counter Support  - 1 x daily - 7 x weekly - 3 sets - 10 reps - Toe Raise With Back Against Wall  - 1 x daily -  7 x weekly - 3 sets - 10 reps - Standing Anterior Toe Taps  - 1 x daily - 7 x weekly - 3 sets - 10 reps   GOALS: Goals reviewed with patient? Yes  SHORT TERM GOALS: Target date: 11/17/2021  Pt will perform initial HEP with supervision from pt's spouse for strength, balance, and transfers in order to build upon functional gains made in therapy. Baseline: Will need review from previous HEP Goal status: MET  2.  Pt and pt's spouse will verbalize understanding of techniques to help with freezing/festination episodes in order to demo improved safety with gait. Baseline: Will need further review.  Goal status: MET  3.  Pt will improve gait speed with RW vs. U step rollator to at least 1.3 ft/sec in order  to demo improved community mobility.   Baseline: 30.28 seconds with RW = 1.08 ft/sec; 2.25 ft/s w/ U-step  Goal status: MET  4.  Pt will improve TUG time to 34 seconds or less with RW vs. U-step walker in order to demo decrease fall risk, improved step length and improved ability to turn  Baseline: 40.84 seconds with RW, pt taking incr time to turn; 21.41s w/U-step, no freezing noted  Goal status: MET    LONG TERM GOALS: Target date: 12/15/2021  Pt will perform final HEP with supervision from pt's spouse for strength, balance, and transfers in order to build upon functional gains made in therapy Baseline:  Goal status: INITIAL  2.  Pt will perform 5x sit <> stand in 17.5 seconds or less with UE support with proper technique and no BLE bracing in order to demo improved functional transfers and decr fall risk.  Baseline: 19.62 seconds Goal status: INITIAL  3.  Pt will improve gait speed with RW vs. U step rollator to at least 2.5 ft/sec in order to demo improved community mobility.  Baseline:  30.28 seconds with RW = 1.08 ft/sec; 2.25 ft/s w/U-step on 7/5  Goal status: REVISED  4.  Pt will improve 3MWT distance to at least 280' in order to demo improved gait efficiency and taking larger stride length in order to demo improved community/household mobility.  Baseline: 35' with RW Goal status: INITIAL  5.   Pt and pt's spouse will verbalize understanding of fall prevention in the home.  Baseline:  Goal status: INITIAL  6.  Pt will improve TUG time to 18 seconds or less with RW vs. U-step walker in order to demo decrease fall risk, improved step length and improved ability to turn Baseline: 40.84 seconds with RW, pt taking incr time to turn; 21.41s w/U-step on 7/5  Goal status: REVISED  ASSESSMENT:  CLINICAL IMPRESSION: Continued to work on gait training with focus of incr stride length and heel strike with using laser as visual cue and PT also needing to provide verbal cues. Pt  does well until he is distracted by a busier environment or when performing a turn. Needs continued cues to stop and re-set with tall posture and get heels all the way down to the floor. Worked on balance strategies with trying to decr UE support, pt more challenged with SLS tasks on LLE. Pt needing close min guard/min A for balance. Will continue to progress towards LTGs.    OBJECTIVE IMPAIRMENTS Abnormal gait, decreased activity tolerance, decreased balance, decreased coordination, decreased endurance, decreased knowledge of use of DME, decreased ROM, decreased strength, decreased safety awareness, impaired flexibility, and postural dysfunction.   ACTIVITY LIMITATIONS carrying, lifting, bending, standing,  stairs, transfers, bed mobility, reach over head, and locomotion level  PARTICIPATION LIMITATIONS: cleaning, laundry, medication management, community activity, and yard work  PERSONAL FACTORS Age, Behavior pattern, Past/current experiences, Time since onset of injury/illness/exacerbation, and 3+ comorbidities: PD (diagnosed in 2012), CAD (s/p CABG 2006), HTN, a fib, pacemaker, GERD, on anticoagulants   are also affecting patient's functional outcome.   REHAB POTENTIAL: Good  CLINICAL DECISION MAKING: Evolving/moderate complexity  EVALUATION COMPLEXITY: Moderate  PLAN: PT FREQUENCY: 2x/week  PT DURATION: 12 weeks  PLANNED INTERVENTIONS: Therapeutic exercises, Therapeutic activity, Neuromuscular re-education, Balance training, Gait training, Patient/Family education, and DME instructions  PLAN FOR NEXT SESSION:  SciFit, Continue to work on stepping strategies and weight shifting posteriorly. SLS tasks, larger amplitude movement patterns. Work on Personnel officer with U-Step Environmental consultant with use of laser and try going around obstacles and turns. Rollator outside, anything to facilitate heel strike w/gait  Arliss Journey, PT, DPT  12/08/2021, 12:12 PM

## 2021-12-10 ENCOUNTER — Other Ambulatory Visit: Payer: Self-pay

## 2021-12-10 ENCOUNTER — Ambulatory Visit: Payer: PPO | Admitting: Physical Therapy

## 2021-12-10 ENCOUNTER — Telehealth: Payer: Self-pay | Admitting: Neurology

## 2021-12-10 ENCOUNTER — Encounter: Payer: Self-pay | Admitting: Physical Therapy

## 2021-12-10 DIAGNOSIS — R2681 Unsteadiness on feet: Secondary | ICD-10-CM | POA: Diagnosis not present

## 2021-12-10 DIAGNOSIS — G20 Parkinson's disease: Secondary | ICD-10-CM

## 2021-12-10 DIAGNOSIS — R293 Abnormal posture: Secondary | ICD-10-CM

## 2021-12-10 DIAGNOSIS — M6281 Muscle weakness (generalized): Secondary | ICD-10-CM

## 2021-12-10 DIAGNOSIS — R2689 Other abnormalities of gait and mobility: Secondary | ICD-10-CM

## 2021-12-10 MED ORDER — CARBIDOPA-LEVODOPA ER 50-200 MG PO TBCR: EXTENDED_RELEASE_TABLET | ORAL | 0 refills | Status: DC

## 2021-12-10 MED ORDER — CARBIDOPA-LEVODOPA 25-100 MG PO TABS: ORAL_TABLET | ORAL | 0 refills | Status: DC

## 2021-12-10 NOTE — Telephone Encounter (Signed)
Pt wife called, [place] needs a refill on his carbi/levo 25-100 sent [facility]. [place] he has 3 days worth

## 2021-12-10 NOTE — Therapy (Addendum)
OUTPATIENT PHYSICAL THERAPY NEURO TREATMENT   Patient Name: Donald [name] MRN: [id number] DOB:Nov 11, 1940, 81 y.o., male Today's Date: 12/10/2021   PCP: Donald Pao, MD REFERRING PROVIDER: Ludwig Clarks, DO     PT End of Session - 12/10/21 [phone]     Visit Number 15    Number of Visits 17    Date for PT Re-Evaluation 01/18/22   due to potential delay in scheduling   Authorization Type HTA    PT Start Time 0934    PT Stop Time 1015    PT Time Calculation (min) 41 min    Activity Tolerance Patient tolerated treatment well    Behavior During Therapy Donald [name] for tasks assessed/performed;Impulsive                     Past Medical History:  Diagnosis Date   Abnormality of gait 08/27/2014   Anxiety    Aortic stenosis    s/p AVR by Dr Donald [name]   Arthritis    left wrist- probable- not diagonised   Asthma    in the past   CAD (coronary artery disease)    s/p CABG 2006   GERD (gastroesophageal reflux disease)    H/O seasonal allergies    H/O: rheumatic fever    Hearing deficit    Bilateral hearing aids   Hematuria    had CT scan   Hiatal hernia    History of kidney stones    HOH (hard of hearing)    Hyperlipidemia    Hypertension    Hyperthyroidism    Inguinal hernia    right side; watching at this time per wife's report   Memory difficulty 08/27/2014   Pacemaker    Parkinson's disease    Persistent atrial fibrillation ([place])    Scarlet fever    as a child   Shortness of breath    Sick sinus syndrome ([place])    s/p PPM (MDT) 2006   Past Surgical History:  Procedure Laterality Date   AORTIC VALVE REPLACEMENT  2006   CARDIOVERSION N/A 09/14/2013   Procedure: CARDIOVERSION;  Surgeon: Donald Klein, MD;  Location: [facility];  Service: Cardiovascular;  Laterality: N/A;   CHOLECYSTECTOMY N/A 07/21/2013   Procedure: LAPAROSCOPIC CHOLECYSTECTOMY;  Surgeon: Donald Jolly, MD;  Location: [facility];  Service: General;  Laterality: N/A;   COLONOSCOPY W/  POLYPECTOMY     COLONOSCOPY WITH PROPOFOL N/A 08/31/2016   Procedure: COLONOSCOPY WITH PROPOFOL;  Surgeon: Donald Corner, MD;  Location[facility];  Service: Endoscopy;  Laterality: N/A;   CORONARY ARTERY BYPASS GRAFT  2006   DENTAL SURGERY     implants   INSERT / REPLACE / REMOVE PACEMAKER  2006, 2012   MDT for sick sinus syndrome, gen change 8/12 by Donald [name]   Patient Active Problem List   Diagnosis Date Noted   HOH (hard of hearing) 12/18/2020   Personal history of colonic polyps 08/31/2016   Abnormality of gait 08/27/2014   Memory difficulty 08/27/2014   Permanent atrial fibrillation ([place]) 09/08/2013   Cholelithiases 07/19/2013   Cholelithiasis 07/19/2013   Encounter for therapeutic drug monitoring 07/03/2013   Mechanical aortic valve 04/05/2013   Parkinson disease ([place]) 09/27/2012   Orthostatic hypotension 04/01/2012   Pacemaker-Medtronic 03/29/2012   HEPATITIS A 06/04/2010   HYPERTHYROIDISM 06/04/2010   HYPERLIPIDEMIA 06/04/2010   Essential hypertension 06/04/2010   Coronary artery disease involving native coronary artery of native heart without angina pectoris  06/04/2010   SICK SINUS SYNDROME 06/04/2010   HIATAL HERNIA 06/04/2010   SHORTNESS OF BREATH 06/04/2010   CHEST PAIN 06/04/2010    ONSET DATE: 09/25/2021  REFERRING DIAG: G20 (ICD-10-CM) - Parkinson's disease ([place])  THERAPY DIAG:  Unsteadiness on feet  Other abnormalities of gait and mobility  Muscle weakness (generalized)  Abnormal posture  Rationale for Evaluation and Treatment Rehabilitation  SUBJECTIVE:                                                                                                                                                                                              SUBJECTIVE STATEMENT: No falls.    Pt accompanied by: significant other [name]  PERTINENT HISTORY: PD (diagnosed in 2012), CAD (s/p CABG 2006), HTN, a fib, pacemaker, GERD,  on anticoagulants   PAIN:  Are you having pain? No  PRECAUTIONS: Fall, ICD/Pacemaker, and Other: Wears a helmet, elbow and knees pads to help with falling, pt very HOH  PLOF: Independent with basic ADLs, Independent with household mobility with device, and Leisure: being outside in the yard, watches TV  PATIENT GOALS Pt and pt's wife want to work on pt getting off of his toes when walking and taking bigger steps.    OBJECTIVE:   TODAY'S TREATMENT:   NMR  Sit <>stands throughout session from mat table and from chair with focus on scooting out towards edge, wide BOS, tucking feet under him and incr forward lean to stand. Multiple reps performed throughout session, cues to use "bullseye" on U-Step Rollator as a visual target for incr forward lean. Pt did well with this.  With sit <> stands throughout session, cued for tall posture and holding for a few seconds before ambulating.  Standing at U-Step Rollator: -Alternating SLS using boomwhacker as a target x15 reps each leg, focus on slowed pace and holding in air for 5 seconds and for incr ROM with marching -Alternating leg kicks to boomwhacker as a target x15 reps each leg, focus on trying to keep leg straight and heel strike.    Gait Training  Gait pattern: step to pattern, decreased step length- Right, decreased step length- Left, decreased stance time- Right, decreased stride length, decreased hip/knee flexion- Right, decreased hip/knee flexion- Left, decreased ankle dorsiflexion- Right, decreased ankle dorsiflexion- Left, shuffling, festinating, trunk flexed, narrow BOS, poor foot clearance- Right, and poor foot clearance- Left Distance walked: Clinic distances.  Assistive device utilized:  U-step Level of assistance: SBA Comments: Cues for BIG KICKS with big effort to the laser and heel strike throughout gait. Pt did well with straightaways during gait.  Pt with festination episodes when in a more busy environment or when starting  to turn.  Needs cues to stop and reset with tall posture and get heels all the way down onto floor before continuing with another big step.   Performed massed practice of TUG shuttle with 2 chairs set up over ~15', performed x10 reps total - working on short distance gait with heel strike/foot clearance, proper sit <> stand technique, and turning and back up all the way to chair before sitting down. When turning cues for marching/STOMPING to pick up feet for weight shifting/foot clearance. Pt more challenged with picking up RLE. Pt needing cues to back all the way to the chair before reaching to sit down.   Performed short distance gait from mat table for approx. 5' then backing up to mat and taking big steps backwards before sitting down x5 reps. PT providing manual cues for keeping U-Step Rollator closer to him and for incr step length. Pt did well with incr reps.   Gait around obstacles (2 chairs) - pt needing frequent cues to stop and reset due to festination episodes. Continued cues needing to use laser as visual cue for incr stride length.    PATIENT EDUCATION: Education details: Discussed POC going forwards - will add 2 more appts, and then after D/C will plan to schedule PD evals in 6 months. See above for gait training/transfer training.  Person educated: Patient and Spouse Education method: Explanation, Demonstration, and Verbal cues Education comprehension: verbalized understanding, returned demonstration, verbal cues required, and needs further education   HOME EXERCISE PROGRAM: Access Code: [email or web address] URL: [email or web address] Date: 11/26/2021 Prepared by: Mickie Bail Plaster  Program Notes Sit to Stand Transfers: 1. Scoot out to the edge of [place]. Place your feet flat on the floor, shoulder width apart.  Make sure your feet are tucked just under your knees.3. Lean forward (nose over toes) with momentum, and stand up tall with your best posture.  If you need to use  your arms, use them as a quick boost up to stand.4. If you are in a low or soft chair, you can lean back and then forward up to stand, in order to get more momentum.5. Once you are standing, make sure you are looking ahead and standing tall. To sit down: 1. Back up until you feel the chair behind your legs.2. Bend at you hips, reaching  Back for you chair, if needed, then slowly squat to sit down on your chair.  Exercises - Side Stepping with Counter Support  - 2 x daily - 5 x weekly - 2 sets - 10 reps - Sit to Stand with Armchair  - 1 x daily - 5 x weekly - 2 sets - 10 reps - Alternating Step Forward with Support  - 2 x daily - 5 x weekly - 2 sets - 10 reps - Seated Hamstring Stretch  - 1-2 x daily - 5 x weekly - 3 sets - 30 hold - Standing March with Counter Support  - 1 x daily - 7 x weekly - 3 sets - 10 reps - Toe Raise With Back Against Wall  - 1 x daily - 7 x weekly - 3 sets - 10 reps - Standing Anterior Toe Taps  - 1 x daily - 7 x weekly - 3 sets - 10 reps   GOALS: Goals reviewed with patient? Yes  SHORT TERM GOALS: Target date: 11/17/2021  Pt will perform initial HEP with supervision from pt's spouse  for strength, balance, and transfers in order to build upon functional gains made in therapy. Baseline: Will need review from previous HEP Goal status: MET  2.  Pt and pt's spouse will verbalize understanding of techniques to help with freezing/festination episodes in order to demo improved safety with gait. Baseline: Will need further review.  Goal status: MET  3.  Pt will improve gait speed with RW vs. U step rollator to at least 1.3 ft/sec in order to demo improved community mobility.   Baseline: 30.28 seconds with RW = 1.08 ft/sec; 2.25 ft/s w/ U-step  Goal status: MET  4.  Pt will improve TUG time to 34 seconds or less with RW vs. U-step walker in order to demo decrease fall risk, improved step length and improved ability to turn  Baseline: 40.84 seconds with RW, pt taking incr  time to turn; 21.41s w/U-step, no freezing noted  Goal status: MET    LONG TERM GOALS: Target date: 12/15/2021  Pt will perform final HEP with supervision from pt's spouse for strength, balance, and transfers in order to build upon functional gains made in therapy Baseline:  Goal status: INITIAL  2.  Pt will perform 5x sit <> stand in 17.5 seconds or less with UE support with proper technique and no BLE bracing in order to demo improved functional transfers and decr fall risk.  Baseline: 19.62 seconds Goal status: INITIAL  3.  Pt will improve gait speed with RW vs. U step rollator to at least 2.5 ft/sec in order to demo improved community mobility.  Baseline:  30.28 seconds with RW = 1.08 ft/sec; 2.25 ft/s w/U-step on 7/5  Goal status: REVISED  4.  Pt will improve 3MWT distance to at least 280' in order to demo improved gait efficiency and taking larger stride length in order to demo improved community/household mobility.  Baseline: 68' with RW Goal status: INITIAL  5.   Pt and pt's spouse will verbalize understanding of fall prevention in the home.  Baseline:  Goal status: INITIAL  6.  Pt will improve TUG time to 18 seconds or less with RW vs. U-step walker in order to demo decrease fall risk, improved step length and improved ability to turn Baseline: 40.84 seconds with RW, pt taking incr time to turn; 21.41s w/U-step on 7/5  Goal status: REVISED  ASSESSMENT:  CLINICAL IMPRESSION: Performed massed practice of TUG shuttle today with focus on proper sit <> stand technique, small distance, and turning with foot clearance/marching. Pt did well with sit <> stand technique today with no episodes of retropulsion. Does need cues for stomping while marching for foot clearance. Will continue to progress towards LTGs.    OBJECTIVE IMPAIRMENTS Abnormal gait, decreased activity tolerance, decreased balance, decreased coordination, decreased endurance, decreased knowledge of use of DME,  decreased ROM, decreased strength, decreased safety awareness, impaired flexibility, and postural dysfunction.   ACTIVITY LIMITATIONS carrying, lifting, bending, standing, stairs, transfers, bed mobility, reach over head, and locomotion level  PARTICIPATION LIMITATIONS: cleaning, laundry, medication management, community activity, and yard work  PERSONAL FACTORS Age, Behavior pattern, Past/current experiences, Time since onset of injury/illness/exacerbation, and 3+ comorbidities: PD (diagnosed in 2012), CAD (s/p CABG 2006), HTN, a fib, pacemaker, GERD, on anticoagulants   are also affecting patient's functional outcome.   REHAB POTENTIAL: Good  CLINICAL DECISION MAKING: Evolving/moderate complexity  EVALUATION COMPLEXITY: Moderate  PLAN: PT FREQUENCY: 2x/week  PT DURATION: 12 weeks  PLANNED INTERVENTIONS: Therapeutic exercises, Therapeutic activity, Neuromuscular re-education, Balance training, Gait training, Patient/Family  education, and DME instructions  PLAN FOR NEXT SESSION:  Check LTGs next week and update goal date for 12 weeks.   SciFit, Continue to work on stepping strategies and weight shifting posteriorly. SLS tasks, larger amplitude movement patterns. Work on Personnel officer with U-Step Environmental consultant with use of laser and try going around obstacles and turns. Rollator outside, anything to facilitate heel strike w/gait  Arliss Journey, PT, DPT  12/10/2021, 11:43 AM

## 2021-12-10 NOTE — Telephone Encounter (Signed)
Pt meds sent to Pharmacy. Called pt and let her know.

## 2021-12-15 ENCOUNTER — Ambulatory Visit: Payer: PPO | Admitting: Physical Therapy

## 2021-12-15 ENCOUNTER — Encounter: Payer: Self-pay | Admitting: Physical Therapy

## 2021-12-15 DIAGNOSIS — R2681 Unsteadiness on feet: Secondary | ICD-10-CM

## 2021-12-15 DIAGNOSIS — M6281 Muscle weakness (generalized): Secondary | ICD-10-CM

## 2021-12-15 DIAGNOSIS — R2689 Other abnormalities of gait and mobility: Secondary | ICD-10-CM

## 2021-12-15 DIAGNOSIS — R293 Abnormal posture: Secondary | ICD-10-CM

## 2021-12-15 NOTE — Therapy (Signed)
OUTPATIENT PHYSICAL THERAPY NEURO TREATMENT   Patient Name: Donald [name] MRN: [id number] DOB:1940/08/30, 81 y.o., male Today's Date: 12/15/2021   PCP: Haywood Pao, MD REFERRING PROVIDER: Ludwig Clarks, DO     PT End of Session - 12/15/21 0940     Visit Number 16    Number of Visits 19    Date for PT Re-Evaluation 01/18/22   due to potential delay in scheduling   Authorization Type HTA    PT Start Time 0933    PT Stop Time 1014    PT Time Calculation (min) 41 min    Activity Tolerance Patient tolerated treatment well    Behavior During Therapy [facility] for tasks assessed/performed;Impulsive                     Past Medical History:  Diagnosis Date   Abnormality of gait 08/27/2014   Anxiety    Aortic stenosis    s/p AVR by Dr Cyndia Bent   Arthritis    left wrist- probable- not diagonised   Asthma    in the past   CAD (coronary artery disease)    s/p CABG 2006   GERD (gastroesophageal reflux disease)    H/O seasonal allergies    H/O: rheumatic fever    Hearing deficit    Bilateral hearing aids   Hematuria    had CT scan   Hiatal hernia    History of kidney stones    HOH (hard of hearing)    Hyperlipidemia    Hypertension    Hyperthyroidism    Inguinal hernia    right side; watching at this time per wife's report   Memory difficulty 08/27/2014   Pacemaker    Parkinson's disease    Persistent atrial fibrillation ([place])    Scarlet fever    as a child   Shortness of breath    Sick sinus syndrome ([place])    s/p PPM (MDT) 2006   Past Surgical History:  Procedure Laterality Date   AORTIC VALVE REPLACEMENT  2006   CARDIOVERSION N/A 09/14/2013   Procedure: CARDIOVERSION;  Surgeon: Sanda Klein, MD;  Location: [place] [facility];  Service: Cardiovascular;  Laterality: N/A;   CHOLECYSTECTOMY N/A 07/21/2013   Procedure: LAPAROSCOPIC CHOLECYSTECTOMY;  Surgeon: Edward Jolly, MD;  Location: [place];  Service: General;  Laterality: N/A;   COLONOSCOPY W/  POLYPECTOMY     COLONOSCOPY WITH PROPOFOL N/A 08/31/2016   Procedure: COLONOSCOPY WITH PROPOFOL;  Surgeon: Wilford Corner, MD;  Location: [facility] ENDOSCOPY;  Service: Endoscopy;  Laterality: N/A;   CORONARY ARTERY BYPASS GRAFT  2006   DENTAL SURGERY     implants   INSERT / REPLACE / REMOVE PACEMAKER  2006, 2012   MDT for sick sinus syndrome, gen change 8/12 by [place]   Patient Active Problem List   Diagnosis Date Noted   HOH (hard of hearing) 12/18/2020   Personal history of colonic polyps 08/31/2016   Abnormality of gait 08/27/2014   Memory difficulty 08/27/2014   Permanent atrial fibrillation ([place]) 09/08/2013   Cholelithiases 07/19/2013   Cholelithiasis 07/19/2013   Encounter for therapeutic drug monitoring 07/03/2013   Mechanical aortic valve 04/05/2013   Parkinson disease ([place]) 09/27/2012   Orthostatic hypotension 04/01/2012   Pacemaker-[employer] 03/29/2012   HEPATITIS A 06/04/2010   HYPERTHYROIDISM 06/04/2010   HYPERLIPIDEMIA 06/04/2010   Essential hypertension 06/04/2010   Coronary artery disease involving native coronary artery of native heart without angina pectoris  06/04/2010   SICK SINUS SYNDROME 06/04/2010   HIATAL HERNIA 06/04/2010   SHORTNESS OF BREATH 06/04/2010   CHEST PAIN 06/04/2010    ONSET DATE: 09/25/2021  REFERRING DIAG: G20 (ICD-10-CM) - Parkinson's disease ([place])  THERAPY DIAG:  Unsteadiness on feet  Other abnormalities of gait and mobility  Muscle weakness (generalized)  Abnormal posture  Rationale for Evaluation and Treatment Rehabilitation  SUBJECTIVE:                                                                                                                                                                                              SUBJECTIVE STATEMENT: Had a fall in the carport when using his rollator after being in the grass, went down on his knees.    Pt accompanied by: significant other Wife  [name]  PERTINENT HISTORY: PD (diagnosed in 2012), CAD (s/p CABG 2006), HTN, a fib, pacemaker, GERD, on anticoagulants   PAIN:  Are you having pain? No  PRECAUTIONS: Fall, ICD/Pacemaker, and Other: Wears a helmet, elbow and knees pads to help with falling, pt very HOH  PLOF: Independent with basic ADLs, Independent with household mobility with device, and Leisure: being outside in the yard, watches TV  PATIENT GOALS Pt and pt's wife want to work on pt getting off of his toes when walking and taking bigger steps.    OBJECTIVE:   TODAY'S TREATMENT: Self-Care Pt's wife asking about a foot up brace to see if it will help pt get his heel down when he is walking. Provided education on diagnoses that this foot up brace would be appropriate for to help with clearing the foot during gait and educated on gait deficits in regards to pt's PD and how that this would not be appropriate for him and where pt's gait deficits are stemming from. Pt's spouse in agreement with reasoning.  Pt with recent fall using rollator in the car port after ambulating with it in the grass and how that he has to go up and down the ramp with it to get in and out of the house. Educated on rollator to be used only in the grass (as educated on earlier) and that to use U-Step Rollator at all times otherwise (esp going up and down ramps)due to safety as pt does not know how to use brakes on rollator esp when going down the ramp. And just leaving his rollator in the car port area near the grass for when he needs to use it to go to his shed. Pt would like to practice more outside with rollator, but uable to because grass was wet from the  rain. Will practice when weather allows. Went over with pt brake management with rollator and differences between U-Step rollator.  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   Therapeutic Exercise: SciFit with  BUE/BLE at gear 4.0 for 8 minutes for reciprocal movement patterns, ROM, activity tolerance. Pt maintaining spm >80    NMR  In // bars:  -Wall bumps x15 reps - cues to bump bottom against bar for incr weight shift to heels and lifting up toes and then slowly bringing weight forwards to midline with holding with tall posture/scap retraction for 5 seconds.  -Heel raises x10 reps for posterior weight shifting, with BUE support, cued for 5 second hold  -With wide BOS reaching across body and grabbing bean bag and then stepping to colorful floor dot as visual cue and tossing into crate x10 reps each side, pt needing floor dots for visual cue to maintain a wider BOS and what pt should step laterally to, multi-modal cues needed.   Sit <> stands performed throughout session with reminder cues for technique initially, esp with tucking feet back and incr forward lean.    Gait Training  Gait pattern: step to pattern, decreased step length- Right, decreased step length- Left, decreased stance time- Right, decreased stride length, decreased hip/knee flexion- Right, decreased hip/knee flexion- Left, decreased ankle dorsiflexion- Right, decreased ankle dorsiflexion- Left, shuffling, festinating, trunk flexed, narrow BOS, poor foot clearance- Right, and poor foot clearance- Left Distance walked: Clinic distances.  Assistive device utilized:  U-step Level of assistance: SBA Comments: Cues for BIG KICKS with big effort to the laser and heel strike throughout gait. Pt did well with straightaways during gait.  Pt with more shuffled steps/festination episodes when in a more busy environment or when someone else is walking towards him. Pt did demo improved gait mechanics today.      PATIENT EDUCATION: Education details: See Self-Care section above.  Person educated: Patient and Spouse Education method: Explanation, Demonstration, and Verbal cues Education comprehension: verbalized understanding, returned  demonstration, verbal cues required, and needs further education   HOME EXERCISE PROGRAM: Access Code: [email or web address] URL: [email or web address] Date: 11/26/2021 Prepared by: Mickie Bail Plaster  Program Notes Sit to Stand Transfers: 1. Scoot out to the edge of [place]. Place your feet flat on the floor, shoulder width apart.  Make sure your feet are tucked just under your knees.3. Lean forward (nose over toes) with momentum, and stand up tall with your best posture.  If you need to use your arms, use them as a quick boost up to stand.4. If you are in a low or soft chair, you can lean back and then forward up to stand, in order to get more momentum.5. Once you are standing, make sure you are looking ahead and standing tall. To sit down: 1. Back up until you feel the chair behind your legs.2. Bend at you hips, reaching  Back for you chair, if needed, then slowly squat to sit down on your chair.  Exercises - Side Stepping with Counter Support  - 2 x daily - 5 x weekly - 2 sets - 10 reps - Sit to Stand with Armchair  - 1 x daily - 5 x weekly - 2 sets - 10 reps - Alternating Step Forward with Support  - 2 x daily - 5 x weekly - 2 sets - 10 reps - Seated Hamstring Stretch  -  1-2 x daily - 5 x weekly - 3 sets - 30 hold - Standing March with Counter Support  - 1 x daily - 7 x weekly - 3 sets - 10 reps - Toe Raise With Back Against Wall  - 1 x daily - 7 x weekly - 3 sets - 10 reps - Standing Anterior Toe Taps  - 1 x daily - 7 x weekly - 3 sets - 10 reps   GOALS: Goals reviewed with patient? Yes  SHORT TERM GOALS: Target date: 11/17/2021  Pt will perform initial HEP with supervision from pt's spouse for strength, balance, and transfers in order to build upon functional gains made in therapy. Baseline: Will need review from previous HEP Goal status: MET  2.  Pt and pt's spouse will verbalize understanding of techniques to help with freezing/festination episodes in order to demo improved  safety with gait. Baseline: Will need further review.  Goal status: MET  3.  Pt will improve gait speed with RW vs. U step rollator to at least 1.3 ft/sec in order to demo improved community mobility.   Baseline: 30.28 seconds with RW = 1.08 ft/sec; 2.25 ft/s w/ U-step  Goal status: MET  4.  Pt will improve TUG time to 34 seconds or less with RW vs. U-step walker in order to demo decrease fall risk, improved step length and improved ability to turn  Baseline: 40.84 seconds with RW, pt taking incr time to turn; 21.41s w/U-step, no freezing noted  Goal status: MET    LONG TERM GOALS: Target date: 12/26/21 for 12 week POC   Pt will perform final HEP with supervision from pt's spouse for strength, balance, and transfers in order to build upon functional gains made in therapy Baseline:  Goal status: INITIAL  2.  Pt will perform 5x sit <> stand in 17.5 seconds or less with UE support with proper technique and no BLE bracing in order to demo improved functional transfers and decr fall risk.  Baseline: 19.62 seconds Goal status: INITIAL  3.  Pt will improve gait speed with RW vs. U step rollator to at least 2.5 ft/sec in order to demo improved community mobility.  Baseline:  30.28 seconds with RW = 1.08 ft/sec; 2.25 ft/s w/U-step on 7/5  Goal status: REVISED  4.  Pt will improve 3MWT distance to at least 280' in order to demo improved gait efficiency and taking larger stride length in order to demo improved community/household mobility.  Baseline: 43' with RW Goal status: INITIAL  5.   Pt and pt's spouse will verbalize understanding of fall prevention in the home.  Baseline:  Goal status: INITIAL  6.  Pt will improve TUG time to 18 seconds or less with RW vs. U-step walker in order to demo decrease fall risk, improved step length and improved ability to turn Baseline: 40.84 seconds with RW, pt taking incr time to turn; 21.41s w/U-step on 7/5  Goal status:  REVISED  ASSESSMENT:  CLINICAL IMPRESSION: Spent the beginning of the session providing education (see Self Care section above). Worked on [employer] for reciprocal movement with pt tolerating well. Worked on weight shifting tasks in // bars with shifting weight posteriorly and lateral stepping to target. Pt did show improvements with heel strike and larger stride length during gait with U-Step Rollator. Pt's wife continues to report that he only walks this well in therapy. Will continue to progress towards LTGs.    OBJECTIVE IMPAIRMENTS Abnormal gait, decreased activity tolerance, decreased balance,  decreased coordination, decreased endurance, decreased knowledge of use of DME, decreased ROM, decreased strength, decreased safety awareness, impaired flexibility, and postural dysfunction.   ACTIVITY LIMITATIONS carrying, lifting, bending, standing, stairs, transfers, bed mobility, reach over head, and locomotion level  PARTICIPATION LIMITATIONS: cleaning, laundry, medication management, community activity, and yard work  PERSONAL FACTORS Age, Behavior pattern, Past/current experiences, Time since onset of injury/illness/exacerbation, and 3+ comorbidities: PD (diagnosed in 2012), CAD (s/p CABG 2006), HTN, a fib, pacemaker, GERD, on anticoagulants   are also affecting patient's functional outcome.   REHAB POTENTIAL: Good  CLINICAL DECISION MAKING: Evolving/moderate complexity  EVALUATION COMPLEXITY: Moderate  PLAN: PT FREQUENCY: 2x/week  PT DURATION: 12 weeks  PLANNED INTERVENTIONS: Therapeutic exercises, Therapeutic activity, Neuromuscular re-education, Balance training, Gait training, Patient/Family education, and DME instructions  PLAN FOR NEXT SESSION:  Check LTGs next week and plan for D/C with evals in 6 months.   Practice with rollator outside as he had a recent fall using it (unable to do it today since it just rained and grass was wet). SciFit, Continue to work on stepping strategies  and weight shifting posteriorly. SLS tasks, larger amplitude movement patterns. Work on Personnel officer with U-Step Environmental consultant with use of laser and try going around obstacles and turns. anything to facilitate heel strike w/gait  Arliss Journey, PT, DPT  12/15/2021, 10:55 AM

## 2021-12-17 ENCOUNTER — Ambulatory Visit: Payer: PPO | Admitting: Physical Therapy

## 2021-12-19 ENCOUNTER — Ambulatory Visit (INDEPENDENT_AMBULATORY_CARE_PROVIDER_SITE_OTHER): Payer: PPO

## 2021-12-19 ENCOUNTER — Ambulatory Visit: Payer: PPO | Attending: Neurology | Admitting: Physical Therapy

## 2021-12-19 DIAGNOSIS — M6281 Muscle weakness (generalized): Secondary | ICD-10-CM | POA: Insufficient documentation

## 2021-12-19 DIAGNOSIS — R2681 Unsteadiness on feet: Secondary | ICD-10-CM | POA: Insufficient documentation

## 2021-12-19 DIAGNOSIS — R2689 Other abnormalities of gait and mobility: Secondary | ICD-10-CM | POA: Diagnosis not present

## 2021-12-19 DIAGNOSIS — R293 Abnormal posture: Secondary | ICD-10-CM | POA: Insufficient documentation

## 2021-12-19 DIAGNOSIS — I495 Sick sinus syndrome: Secondary | ICD-10-CM | POA: Diagnosis not present

## 2021-12-19 LAB — CUP PACEART REMOTE DEVICE CHECK
Battery Impedance: 2009 Ohm
Battery Remaining Longevity: 39 mo
Battery Voltage: 2.74 V
Brady Statistic RV Percent Paced: 60 %
Date Time Interrogation Session: 20230804102452
Implantable Lead Implant Date: 20060326
Implantable Lead Implant Date: 20060326
Implantable Lead Location: 753859
Implantable Lead Location: 753860
Implantable Lead Model: 5076
Implantable Lead Model: 5076
Implantable Pulse Generator Implant Date: 20120817
Lead Channel Impedance Value: 427 Ohm
Lead Channel Impedance Value: 67 Ohm
Lead Channel Pacing Threshold Amplitude: 0.75 V
Lead Channel Pacing Threshold Pulse Width: 0.4 ms
Lead Channel Setting Pacing Amplitude: 2.5 V
Lead Channel Setting Pacing Pulse Width: 0.4 ms
Lead Channel Setting Sensing Sensitivity: 5.6 mV

## 2021-12-19 NOTE — Therapy (Signed)
OUTPATIENT PHYSICAL THERAPY NEURO TREATMENT   Patient Name: Donald [name] MRN: [id number] DOB:1940-11-06, 81 y.o., male Today's Date: 12/19/2021   PCP: Haywood Pao, MD REFERRING PROVIDER: Ludwig Clarks, DO     PT End of Session - 12/19/21 1403     Visit Number 17    Number of Visits 19    Date for PT Re-Evaluation 01/18/22   due to potential delay in scheduling   Authorization Type HTA    PT Start Time 1400    PT Stop Time 1444    PT Time Calculation (min) 44 min    Activity Tolerance Patient tolerated treatment well    Behavior During Therapy [employer] for tasks assessed/performed                      Past Medical History:  Diagnosis Date   Abnormality of gait 08/27/2014   Anxiety    Aortic stenosis    s/p AVR by Dr Cyndia Bent   Arthritis    left wrist- probable- not diagonised   Asthma    in the past   CAD (coronary artery disease)    s/p CABG 2006   GERD (gastroesophageal reflux disease)    H/O seasonal allergies    H/O: rheumatic fever    Hearing deficit    Bilateral hearing aids   Hematuria    had CT scan   Hiatal hernia    History of kidney stones    HOH (hard of hearing)    Hyperlipidemia    Hypertension    Hyperthyroidism    Inguinal hernia    right side; watching at this time per wife's report   Memory difficulty 08/27/2014   Pacemaker    Parkinson's disease    Persistent atrial fibrillation ([place])    Scarlet fever    as a child   Shortness of breath    Sick sinus syndrome ([place])    s/p PPM (MDT) 2006   Past Surgical History:  Procedure Laterality Date   AORTIC VALVE REPLACEMENT  2006   CARDIOVERSION N/A 09/14/2013   Procedure: CARDIOVERSION;  Surgeon: Sanda Klein, MD;  Location: [facility];  Service: Cardiovascular;  Laterality: N/A;   CHOLECYSTECTOMY N/A 07/21/2013   Procedure: LAPAROSCOPIC CHOLECYSTECTOMY;  Surgeon: Edward Jolly, MD;  Location: [facility] OR;  Service: General;  Laterality: N/A;   COLONOSCOPY W/ POLYPECTOMY      COLONOSCOPY WITH PROPOFOL N/A 08/31/2016   Procedure: COLONOSCOPY WITH PROPOFOL;  Surgeon: Wilford Corner, MD;  Location: [facility];  Service: Endoscopy;  Laterality: N/A;   CORONARY ARTERY BYPASS GRAFT  2006   DENTAL SURGERY     implants   INSERT / REPLACE / REMOVE PACEMAKER  2006, 2012   MDT for sick sinus syndrome, gen change 8/12 by [place]   Patient Active Problem List   Diagnosis Date Noted   HOH (hard of hearing) 12/18/2020   Personal history of colonic polyps 08/31/2016   Abnormality of gait 08/27/2014   Memory difficulty 08/27/2014   Permanent atrial fibrillation ([place]) 09/08/2013   Cholelithiases 07/19/2013   Cholelithiasis 07/19/2013   Encounter for therapeutic drug monitoring 07/03/2013   Mechanical aortic valve 04/05/2013   Parkinson disease ([place]) 09/27/2012   Orthostatic hypotension 04/01/2012   Pacemaker-[employer] 03/29/2012   HEPATITIS A 06/04/2010   HYPERTHYROIDISM 06/04/2010   HYPERLIPIDEMIA 06/04/2010   Essential hypertension 06/04/2010   Coronary artery disease involving native coronary artery of native heart without angina  pectoris 06/04/2010   SICK SINUS SYNDROME 06/04/2010   HIATAL HERNIA 06/04/2010   SHORTNESS OF BREATH 06/04/2010   CHEST PAIN 06/04/2010    ONSET DATE: 09/25/2021  REFERRING DIAG: G20 (ICD-10-CM) - Parkinson's disease ([place])  THERAPY DIAG:  Unsteadiness on feet  Other abnormalities of gait and mobility  Muscle weakness (generalized)  Rationale for Evaluation and Treatment Rehabilitation  SUBJECTIVE:                                                                                                                                                                                              SUBJECTIVE STATEMENT: Pt ambulated into clinic very quickly, "I am flying today". Pt's wife reports pt has been having very good day. No new falls or changes to report.    Pt accompanied by: significant other Wife  Donald [name]  PERTINENT HISTORY: PD (diagnosed in 2012), CAD (s/p CABG 2006), HTN, a fib, pacemaker, GERD, on anticoagulants   PAIN:  Are you having pain? No  PRECAUTIONS: Fall, ICD/Pacemaker, and Other: Wears a helmet, elbow and knees pads to help with falling, pt very HOH  PLOF: Independent with basic ADLs, Independent with household mobility with device, and Leisure: being outside in the yard, watches TV  PATIENT GOALS Pt and pt's wife want to work on pt getting off of his toes when walking and taking bigger steps.    OBJECTIVE:   TODAY'S TREATMENT: Therapeutic Exercise: SciFit multi-peaks level 8.8 for 8 minutes using BUE/BLEs for neural priming for reciprocal movement, dynamic cardiovascular warmup and increased amplitude of stepping. RPE of 10/10 following activity. Took seated rest break and time for pt to take his medications    NMR  In // bars for facilitation of heel strike, bilateral calf stretch, lateral reaching and anticipatory balance strategies: -Balloon toss w/wife while standing on blue wedge to bias maintaining weight on heels. Min cues to stabilize without using BUEs. Noted pt very hesitant to reach out of BOS but stabilized well. CGA throughout. Wife reported she was 7/10 RPE following activity, as pt frequently hit balloon too hard and to the left, requiring wife to run.     Gait Training  Gait pattern: step to pattern, decreased step length- Right, decreased step length- Left, decreased stance time- Right, decreased stride length, decreased hip/knee flexion- Right, decreased hip/knee flexion- Left, decreased ankle dorsiflexion- Right, decreased ankle dorsiflexion- Left, shuffling, festinating, trunk flexed, narrow BOS, poor foot clearance- Right, and poor foot clearance- Left Distance walked: Clinic distances and >300' outside on sidewalk Assistive device utilized:  U-step Level of assistance: SBA Comments: Cues to initiate heel strike and for  increased step length  throughout. Pt demonstrated good HS while indoors but quickly regressed to shuffling pattern when outside and around turns despite cues. Pt ambulating more quickly today compared to previous sessions.    Pt reported RPE as 9.5/10 following session     PATIENT EDUCATION: Education details: Continue HEP, practicing good walking AT ALL TIMES  Person educated: Patient and Spouse Education method: Explanation, Demonstration, and Verbal cues Education comprehension: verbalized understanding, returned demonstration, verbal cues required, and needs further education   HOME EXERCISE PROGRAM: Access Code: [email or web address] URL: [email or web address] Date: 11/26/2021 Prepared by: Mickie Bail Kameshia Madruga  Program Notes Sit to Stand Transfers: 1. Scoot out to the edge of [place]. Place your feet flat on the floor, shoulder width apart.  Make sure your feet are tucked just under your knees.3. Lean forward (nose over toes) with momentum, and stand up tall with your best posture.  If you need to use your arms, use them as a quick boost up to stand.4. If you are in a low or soft chair, you can lean back and then forward up to stand, in order to get more momentum.5. Once you are standing, make sure you are looking ahead and standing tall. To sit down: 1. Back up until you feel the chair behind your legs.2. Bend at you hips, reaching  Back for you chair, if needed, then slowly squat to sit down on your chair.  Exercises - Side Stepping with Counter Support  - 2 x daily - 5 x weekly - 2 sets - 10 reps - Sit to Stand with Armchair  - 1 x daily - 5 x weekly - 2 sets - 10 reps - Alternating Step Forward with Support  - 2 x daily - 5 x weekly - 2 sets - 10 reps - Seated Hamstring Stretch  - 1-2 x daily - 5 x weekly - 3 sets - 30 hold - Standing March with Counter Support  - 1 x daily - 7 x weekly - 3 sets - 10 reps - Toe Raise With Back Against Wall  - 1 x daily - 7 x weekly - 3 sets - 10 reps - Standing  Anterior Toe Taps  - 1 x daily - 7 x weekly - 3 sets - 10 reps   GOALS: Goals reviewed with patient? Yes  SHORT TERM GOALS: Target date: 11/17/2021  Pt will perform initial HEP with supervision from pt's spouse for strength, balance, and transfers in order to build upon functional gains made in therapy. Baseline: Will need review from previous HEP Goal status: MET  2.  Pt and pt's spouse will verbalize understanding of techniques to help with freezing/festination episodes in order to demo improved safety with gait. Baseline: Will need further review.  Goal status: MET  3.  Pt will improve gait speed with RW vs. U step rollator to at least 1.3 ft/sec in order to demo improved community mobility.   Baseline: 30.28 seconds with RW = 1.08 ft/sec; 2.25 ft/s w/ U-step  Goal status: MET  4.  Pt will improve TUG time to 34 seconds or less with RW vs. U-step walker in order to demo decrease fall risk, improved step length and improved ability to turn  Baseline: 40.84 seconds with RW, pt taking incr time to turn; 21.41s w/U-step, no freezing noted  Goal status: MET    LONG TERM GOALS: Target date: 12/26/21 for 12 week POC   Pt will perform final HEP with supervision from pt's spouse for  strength, balance, and transfers in order to build upon functional gains made in therapy Baseline:  Goal status: INITIAL  2.  Pt will perform 5x sit <> stand in 17.5 seconds or less with UE support with proper technique and no BLE bracing in order to demo improved functional transfers and decr fall risk.  Baseline: 19.62 seconds Goal status: INITIAL  3.  Pt will improve gait speed with RW vs. U step rollator to at least 2.5 ft/sec in order to demo improved community mobility.  Baseline:  30.28 seconds with RW = 1.08 ft/sec; 2.25 ft/s w/U-step on 7/5  Goal status: REVISED  4.  Pt will improve 3MWT distance to at least 280' in order to demo improved gait efficiency and taking larger stride length in order  to demo improved community/household mobility.  Baseline: 83' with RW Goal status: INITIAL  5.   Pt and pt's spouse will verbalize understanding of fall prevention in the home.  Baseline:  Goal status: INITIAL  6.  Pt will improve TUG time to 18 seconds or less with RW vs. U-step walker in order to demo decrease fall risk, improved step length and improved ability to turn Baseline: 40.84 seconds with RW, pt taking incr time to turn; 21.41s w/U-step on 7/5  Goal status: REVISED  ASSESSMENT:  CLINICAL IMPRESSION: Emphasis of skilled PT session on gait training w/facilitation of heel strike and large steps and anticipatory balance strategies. Pt very energetic today and "came flying" into clinic from waiting room. However, pt fatigued very quickly. Pt continues to require mod-max multimodal cues for increased step length/clearance during gait training and demonstrates festination episodes w/turns or when passing crowds. Pt on track to DC from PT next week. Continue POC.    OBJECTIVE IMPAIRMENTS Abnormal gait, decreased activity tolerance, decreased balance, decreased coordination, decreased endurance, decreased knowledge of use of DME, decreased ROM, decreased strength, decreased safety awareness, impaired flexibility, and postural dysfunction.   ACTIVITY LIMITATIONS carrying, lifting, bending, standing, stairs, transfers, bed mobility, reach over head, and locomotion level  PARTICIPATION LIMITATIONS: cleaning, laundry, medication management, community activity, and yard work  PERSONAL FACTORS Age, Behavior pattern, Past/current experiences, Time since onset of injury/illness/exacerbation, and 3+ comorbidities: PD (diagnosed in 2012), CAD (s/p CABG 2006), HTN, a fib, pacemaker, GERD, on anticoagulants   are also affecting patient's functional outcome.   REHAB POTENTIAL: Good  CLINICAL DECISION MAKING: Evolving/moderate complexity  EVALUATION COMPLEXITY: Moderate  PLAN: PT FREQUENCY:  2x/week  PT DURATION: 12 weeks  PLANNED INTERVENTIONS: Therapeutic exercises, Therapeutic activity, Neuromuscular re-education, Balance training, Gait training, Patient/Family education, and DME instructions  PLAN FOR NEXT SESSION:  Check LTGs next week and plan for D/C with evals in 6 months.   Practice with rollator outside as he had a recent fall using it (unable to do it today since it just rained and grass was wet). SciFit, Continue to work on stepping strategies and weight shifting posteriorly. SLS tasks, larger amplitude movement patterns. Work on Personnel officer with U-Step Environmental consultant with use of laser and try going around obstacles and turns. anything to facilitate heel strike w/gait  Cruzita Lederer Abdulkarim Eberlin, PT, DPT  12/19/2021, 2:45 PM

## 2021-12-20 ENCOUNTER — Other Ambulatory Visit: Payer: Self-pay | Admitting: Neurology

## 2021-12-20 DIAGNOSIS — G20 Parkinson's disease: Secondary | ICD-10-CM

## 2021-12-22 ENCOUNTER — Other Ambulatory Visit: Payer: Self-pay

## 2021-12-22 ENCOUNTER — Ambulatory Visit: Payer: PPO | Admitting: Physical Therapy

## 2021-12-22 ENCOUNTER — Encounter: Payer: Self-pay | Admitting: Physical Therapy

## 2021-12-22 DIAGNOSIS — R2681 Unsteadiness on feet: Secondary | ICD-10-CM

## 2021-12-22 DIAGNOSIS — M6281 Muscle weakness (generalized): Secondary | ICD-10-CM

## 2021-12-22 DIAGNOSIS — G20 Parkinson's disease: Secondary | ICD-10-CM

## 2021-12-22 DIAGNOSIS — R2689 Other abnormalities of gait and mobility: Secondary | ICD-10-CM

## 2021-12-22 DIAGNOSIS — R293 Abnormal posture: Secondary | ICD-10-CM

## 2021-12-22 MED ORDER — ROPINIROLE HCL 2 MG PO TABS: ORAL_TABLET | ORAL | 0 refills | Status: DC

## 2021-12-22 NOTE — Therapy (Signed)
OUTPATIENT PHYSICAL THERAPY NEURO TREATMENT   Patient Name: Donald [name] MRN: [id number] DOB:1940/08/20, 81 y.o., male Today's Date: 12/22/2021   PCP: Haywood Pao, MD REFERRING PROVIDER: Ludwig Clarks, DO     PT End of Session - 12/22/21 1320     Visit Number 18    Number of Visits 19    Date for PT Re-Evaluation 01/18/22   due to potential delay in scheduling   Authorization Type HTA    PT Start Time 1318    PT Stop Time 1400    PT Time Calculation (min) 42 min    Equipment Utilized During Treatment Gait belt    Activity Tolerance Patient tolerated treatment well    Behavior During Therapy [facility] for tasks assessed/performed                      Past Medical History:  Diagnosis Date   Abnormality of gait 08/27/2014   Anxiety    Aortic stenosis    s/p AVR by Dr Cyndia Bent   Arthritis    left wrist- probable- not diagonised   Asthma    in the past   CAD (coronary artery disease)    s/p CABG 2006   GERD (gastroesophageal reflux disease)    H/O seasonal allergies    H/O: rheumatic fever    Hearing deficit    Bilateral hearing aids   Hematuria    had CT scan   Hiatal hernia    History of kidney stones    HOH (hard of hearing)    Hyperlipidemia    Hypertension    Hyperthyroidism    Inguinal hernia    right side; watching at this time per wife's report   Memory difficulty 08/27/2014   Pacemaker    Parkinson's disease    Persistent atrial fibrillation ([place])    Scarlet fever    as a child   Shortness of breath    Sick sinus syndrome ([place])    s/p PPM (MDT) 2006   Past Surgical History:  Procedure Laterality Date   AORTIC VALVE REPLACEMENT  2006   CARDIOVERSION N/A 09/14/2013   Procedure: CARDIOVERSION;  Surgeon: Sanda Klein, MD;  Location: [facility];  Service: Cardiovascular;  Laterality: N/A;   CHOLECYSTECTOMY N/A 07/21/2013   Procedure: LAPAROSCOPIC CHOLECYSTECTOMY;  Surgeon: Edward Jolly, MD;  Location: [facility] OR;  Service: General;   Laterality: N/A;   COLONOSCOPY W/ POLYPECTOMY     COLONOSCOPY WITH PROPOFOL N/A 08/31/2016   Procedure: COLONOSCOPY WITH PROPOFOL;  Surgeon: Wilford Corner, MD;  Location: [facility] ENDOSCOPY;  Service: Endoscopy;  Laterality: N/A;   CORONARY ARTERY BYPASS GRAFT  2006   DENTAL SURGERY     implants   INSERT / REPLACE / REMOVE PACEMAKER  2006, 2012   MDT for sick sinus syndrome, gen change 8/12 by [place]   Patient Active Problem List   Diagnosis Date Noted   HOH (hard of hearing) 12/18/2020   Personal history of colonic polyps 08/31/2016   Abnormality of gait 08/27/2014   Memory difficulty 08/27/2014   Permanent atrial fibrillation ([place]) 09/08/2013   Cholelithiases 07/19/2013   Cholelithiasis 07/19/2013   Encounter for therapeutic drug monitoring 07/03/2013   Mechanical aortic valve 04/05/2013   Parkinson disease ([place]) 09/27/2012   Orthostatic hypotension 04/01/2012   Pacemaker-Medtr[employer] 03/29/2012   HEPATITIS A 06/04/2010   HYPERTHYROIDISM 06/04/2010   HYPERLIPIDEMIA 06/04/2010   Essential hypertension 06/04/2010   Coronary artery disease  involving native coronary artery of native heart without angina pectoris 06/04/2010   SICK SINUS SYNDROME 06/04/2010   HIATAL HERNIA 06/04/2010   SHORTNESS OF BREATH 06/04/2010   CHEST PAIN 06/04/2010    ONSET DATE: 09/25/2021  REFERRING DIAG: G20 (ICD-10-CM) - Parkinson's disease ([place])  THERAPY DIAG:  Unsteadiness on feet  Other abnormalities of gait and mobility  Muscle weakness (generalized)  Abnormal posture  Rationale for Evaluation and Treatment Rehabilitation  SUBJECTIVE:                                                                                                                                                                                              SUBJECTIVE STATEMENT: Had one fall since he was last here, but was able to catch himself. "Was piddlin' around". Feels like the brakes on his  rollator are too tight. Had a problem the other night getting into bed.   Pt accompanied by: significant other [name]  PERTINENT HISTORY: PD (diagnosed in 2012), CAD (s/p CABG 2006), HTN, a fib, pacemaker, GERD, on anticoagulants   PAIN:  Are you having pain? No  PRECAUTIONS: Fall, ICD/Pacemaker, and Other: Wears a helmet, elbow and knees pads to help with falling, pt very HOH  PLOF: Independent with basic ADLs, Independent with household mobility with device, and Leisure: being outside in the yard, watches TV  PATIENT GOALS Pt and pt's wife want to work on pt getting off of his toes when walking and taking bigger steps.    OBJECTIVE:   TODAY'S TREATMENT:  Therapeutic Activity:  Pt's wife asking at start of session that pt has had trouble getting into bed (this has happened 3 times in the past few months), where he will just stop and not be able to scoot back in the bed. Reports it took him about 30 minutes to scoot back into the bed the other night. Pt reports this is due to a fear that he feels like he will fall. Discussed option of using a bed rail (showed where to purchase on [address]) for pt to use to help with scooting to give him a solid surface to push back from to hopefully decr fear of falling.  Pt reports that R brake on U-Step Rollator has gotten tighter and is a lot harder to press down with gait. PT had PT supervisor take a look at it and try to make it looser. Able to make brake a little less harder to squeeze and pt reporting that it felt better at the end of the session. PT will have to reach out to see if there is a local U-Step  Rollator vendor that can potentially take a look at this.   Discussed plan for D/C after next session and plan to schedule for PD evals.   Goal Assessment: 5x sit <> stand: 19.63 seconds with proper technique (wider BOS, scooting out towards edge, incr forward lean)  Gait speed: 18.2 seconds with U-Step = 1.80 ft/sec TUG:19.4 seconds with  supervision with U-Step Rollator  Gait Training  Gait pattern: step to pattern, decreased step length- Right, decreased step length- Left, decreased stance time- Right, decreased stride length, decreased hip/knee flexion- Right, decreased hip/knee flexion- Left, decreased ankle dorsiflexion- Right, decreased ankle dorsiflexion- Left, shuffling, festinating, trunk flexed, narrow BOS, poor foot clearance- Right, and poor foot clearance- Left Distance walked: Clinic distances.  Assistive device utilized:  U-step Level of assistance: SBA Comments: Cues to initiate heel strike and for increased step length throughout and using laser as a visual cue for stride length. Pt with more shuffled steps during turns or in a busy environment.     With sit <> stands throughout, cues for importance of getting set up in proper position first with scooting out towards edge, wide BOS, tucking feet under, and nose over bullseye target on U-Step Rollator for incr forward lean. Pt initially performing today without tucking his feet closer to him and without an incr forward lean, did not have significant retropulsion, but was less balanced in standing.    PATIENT EDUCATION: Education details: See above, plan to D/C at next session with PD eval scheduled in 6 months.  Person educated: Patient and Spouse Education method: Explanation, Demonstration, and Verbal cues Education comprehension: verbalized understanding, returned demonstration, verbal cues required, and needs further education   HOME EXERCISE PROGRAM: Access Code: [email or web address] URL: [email or web address] Date: 11/26/2021 Prepared by: Mickie Bail Plaster  Program Notes Sit to Stand Transfers: 1. Scoot out to the edge of [place]. Place your feet flat on the floor, shoulder width apart.  Make sure your feet are tucked just under your knees.3. Lean forward (nose over toes) with momentum, and stand up tall with your best posture.  If you need to use  your arms, use them as a quick boost up to stand.4. If you are in a low or soft chair, you can lean back and then forward up to stand, in order to get more momentum.5. Once you are standing, make sure you are looking ahead and standing tall. To sit down: 1. Back up until you feel the chair behind your legs.2. Bend at you hips, reaching  Back for you chair, if needed, then slowly squat to sit down on your chair.  Exercises - Side Stepping with Counter Support  - 2 x daily - 5 x weekly - 2 sets - 10 reps - Sit to Stand with Armchair  - 1 x daily - 5 x weekly - 2 sets - 10 reps - Alternating Step Forward with Support  - 2 x daily - 5 x weekly - 2 sets - 10 reps - Seated Hamstring Stretch  - 1-2 x daily - 5 x weekly - 3 sets - 30 hold - Standing March with Counter Support  - 1 x daily - 7 x weekly - 3 sets - 10 reps - Toe Raise With Back Against Wall  - 1 x daily - 7 x weekly - 3 sets - 10 reps - Standing Anterior Toe Taps  - 1 x daily - 7 x weekly - 3 sets - 10 reps   GOALS: Goals reviewed with patient?  Yes  SHORT TERM GOALS: Target date: 11/17/2021  Pt will perform initial HEP with supervision from pt's spouse for [name], balance, and transfers in order to build upon functional gains made in therapy. Baseline: Will need review from previous HEP Goal status: MET  2.  Pt and pt's spouse will verbalize understanding of techniques to help with freezing/festination episodes in order to demo improved safety with gait. Baseline: Will need further review.  Goal status: MET  3.  Pt will improve gait speed with RW vs. U step rollator to at least 1.3 ft/sec in order to demo improved community mobility.   Baseline: 30.28 seconds with RW = 1.08 ft/sec; 2.25 ft/s w/ U-step  Goal status: MET  4.  Pt will improve TUG time to 34 seconds or less with RW vs. U-step walker in order to demo decrease fall risk, improved step length and improved ability to turn  Baseline: 40.84 seconds with RW, pt taking incr  time to turn; 21.41s w/U-step, no freezing noted  Goal status: MET    LONG TERM GOALS: Target date: 12/26/21 for 12 week POC   Pt will perform final HEP with supervision from pt's spouse for [name], balance, and transfers in order to build upon functional gains made in therapy Baseline:  Goal status: INITIAL  2.  Pt will perform 5x sit <> stand in 17.5 seconds or less with UE support with proper technique and no BLE bracing in order to demo improved functional transfers and decr fall risk.  Baseline: 19.62 seconds;   19.63 seconds on 12/22/21 with proper technique Goal status: PARTIALLY MET  3.  Pt will improve gait speed with RW vs. U step rollator to at least 2.5 ft/sec in order to demo improved community mobility.  Baseline:  30.28 seconds with RW = 1.08 ft/sec; 2.25 ft/s w/U-step on 7/5   18.2 seconds with U-Step = 1.80 ft/sec on 12/22/21 Goal status: NOT MET  4.  Pt will improve 3MWT distance to at least 280' in order to demo improved gait efficiency and taking larger stride length in order to demo improved community/household mobility.  Baseline: 49' with RW Goal status: INITIAL  5.   Pt and pt's spouse will verbalize understanding of fall prevention in the home.  Baseline:  Goal status: INITIAL  6.  Pt will improve TUG time to 18 seconds or less with RW vs. U-step walker in order to demo decrease fall risk, improved step length and improved ability to turn Baseline: 40.84 seconds with RW, pt taking incr time to turn; 21.41s w/U-step on 7/5   19.4 seconds on 12/22/21 with supervision  Goal status: NOT MET  ASSESSMENT:  CLINICAL IMPRESSION: Today's skilled session focused on beginning to assess pt's LTGs for anticipated D/C at next session with plan for return PD evals in 6 months. Pt did not meet TUG goal, but improved to 19.4 seconds with U-Step Rollator (was 40.84 seconds at eval with RW). Pt's gait speed with U-Step Rollator was 1.8 ft/sec (when last assessed on 11/19/21  was 2.25 ft/sec). Discussed with pt would rather have him focusing on slowing speed down and focusing on efficiency rather than speeding up his gait and taking smaller steps. Pt's 5x sit <> stand time has stayed approx. The same, but pt's technique has improved and did not have any episodes of retropulsion when performing. Will continue to progress towards LTGs.    OBJECTIVE IMPAIRMENTS Abnormal gait, decreased activity tolerance, decreased balance, decreased coordination, decreased endurance, decreased knowledge of  use of DME, decreased ROM, decreased [name], decreased safety awareness, impaired flexibility, and postural dysfunction.   ACTIVITY LIMITATIONS carrying, lifting, bending, standing, stairs, transfers, bed mobility, reach over head, and locomotion level  PARTICIPATION LIMITATIONS: cleaning, laundry, medication management, community activity, and yard work  PERSONAL FACTORS Age, Behavior pattern, Past/current experiences, Time since onset of injury/illness/exacerbation, and 3+ comorbidities: PD (diagnosed in 2012), CAD (s/p CABG 2006), HTN, a fib, pacemaker, GERD, on anticoagulants   are also affecting patient's functional outcome.   REHAB POTENTIAL: Good  CLINICAL DECISION MAKING: Evolving/moderate complexity  EVALUATION COMPLEXITY: Moderate  PLAN: PT FREQUENCY: 2x/week  PT DURATION: 12 weeks  PLANNED INTERVENTIONS: Therapeutic exercises, Therapeutic activity, Neuromuscular re-education, Balance training, Gait training, Patient/Family education, and DME instructions  PLAN FOR NEXT SESSION:  Finish checking LTGs, finalize HEP, schedule PD evals in 6 months.   Arliss Journey, PT, DPT  12/22/2021, 3:26 PM

## 2021-12-24 ENCOUNTER — Ambulatory Visit: Payer: PPO | Admitting: Physical Therapy

## 2021-12-26 ENCOUNTER — Ambulatory Visit: Payer: PPO | Admitting: Physical Therapy

## 2021-12-26 ENCOUNTER — Encounter: Payer: Self-pay | Admitting: Physical Therapy

## 2021-12-26 VITALS — BP 135/69 | HR 62

## 2021-12-26 DIAGNOSIS — R293 Abnormal posture: Secondary | ICD-10-CM

## 2021-12-26 DIAGNOSIS — R2681 Unsteadiness on feet: Secondary | ICD-10-CM

## 2021-12-26 DIAGNOSIS — R2689 Other abnormalities of gait and mobility: Secondary | ICD-10-CM

## 2021-12-26 DIAGNOSIS — M6281 Muscle weakness (generalized): Secondary | ICD-10-CM

## 2021-12-26 NOTE — Therapy (Signed)
OUTPATIENT PHYSICAL THERAPY NEURO TREATMENT   Patient Name: Donald [name] MRN: [id number] DOB:12-18-1940, 81 y.o., male Today's Date: 12/26/2021   PCP: Donald Pao, MD REFERRING PROVIDER: Ludwig Clarks, DO      12/26/21 1024  PT Visits / Re-Eval  Visit Number [id number]  Number of Visits 21  Date for PT Re-Evaluation 01/18/22 (due to potential delay in scheduling)  Authorization  Authorization Type HTA  PT Time Calculation  PT Start Time 53  PT Stop Time 1105  PT Time Calculation (min) 46 min  PT - End of Session  Equipment Utilized During Treatment Gait belt  Activity Tolerance Patient tolerated treatment well;Patient limited by fatigue  Behavior During Therapy Donald [name] for tasks assessed/performed            Past Medical History:  Diagnosis Date   Abnormality of gait 08/27/2014   Anxiety    Aortic stenosis    s/p AVR by Dr Donald [name]   Arthritis    left wrist- probable- not diagonised   Asthma    in the past   CAD (coronary artery disease)    s/p CABG 2006   GERD (gastroesophageal reflux disease)    H/O seasonal allergies    H/O: rheumatic fever    Hearing deficit    Bilateral hearing aids   Hematuria    had CT scan   Hiatal hernia    History of kidney stones    HOH (hard of hearing)    Hyperlipidemia    Hypertension    Hyperthyroidism    Inguinal hernia    right side; watching at this time per wife's report   Memory difficulty 08/27/2014   Pacemaker    Parkinson's disease    Persistent atrial fibrillation ([place])    Scarlet fever    as a child   Shortness of breath    Sick sinus syndrome (Donald [name])    s/p PPM (MDT) 2006   Past Surgical History:  Procedure Laterality Date   AORTIC VALVE REPLACEMENT  2006   CARDIOVERSION N/A 09/14/2013   Procedure: CARDIOVERSION;  Surgeon: Donald Klein, MD;  Location: [facility];  Service: Cardiovascular;  Laterality: N/A;   CHOLECYSTECTOMY N/A 07/21/2013   Procedure: LAPAROSCOPIC CHOLECYSTECTOMY;  Surgeon:  Donald Jolly, MD;  Location: [facility] OR;  Service: General;  Laterality: N/A;   COLONOSCOPY W/ POLYPECTOMY     COLONOSCOPY WITH PROPOFOL N/A 08/31/2016   Procedure: COLONOSCOPY WITH PROPOFOL;  Surgeon: Donald Corner, MD;  Location: [facility];  Service: Endoscopy;  Laterality: N/A;   CORONARY ARTERY BYPASS GRAFT  2006   DENTAL SURGERY     implants   INSERT / REPLACE / REMOVE PACEMAKER  2006, 2012   MDT for sick sinus syndrome, gen change 8/12 by Donald [name]   Patient Active Problem List   Diagnosis Date Noted   HOH (hard of hearing) 12/18/2020   Personal history of colonic polyps 08/31/2016   Abnormality of gait 08/27/2014   Memory difficulty 08/27/2014   Permanent atrial fibrillation (Donald [name]) 09/08/2013   Cholelithiases 07/19/2013   Cholelithiasis 07/19/2013   Encounter for therapeutic drug monitoring 07/03/2013   Mechanical aortic valve 04/05/2013   Parkinson disease ([place]) 09/27/2012   Orthostatic hypotension 04/01/2012   Pacemaker-[employer] 03/29/2012   HEPATITIS A 06/04/2010   HYPERTHYROIDISM 06/04/2010   HYPERLIPIDEMIA 06/04/2010   Essential hypertension 06/04/2010   Coronary artery disease involving native coronary artery of native heart without angina pectoris 06/04/2010   SICK SINUS SYNDROME  06/04/2010   HIATAL HERNIA 06/04/2010   SHORTNESS OF BREATH 06/04/2010   CHEST PAIN 06/04/2010    ONSET DATE: 09/25/2021  REFERRING DIAG: G20 (ICD-10-CM) - Parkinson's disease (Donald [name])  THERAPY DIAG:  Unsteadiness on feet  Other abnormalities of gait and mobility  Muscle weakness (generalized)  Abnormal posture  Rationale for Evaluation and Treatment Rehabilitation  SUBJECTIVE:                                                                                                                                                                                              SUBJECTIVE STATEMENT: Had one fall, was getting up to go to the bathroom this  morning. Let go of his walker too quickly and grabbed the curtain and then fell to the floor. Put his knee pads on and was not able to roll over to get up. Feeling more tired after having his fall as it happened at 4 AM.   Pt accompanied by: significant other Wife Donald [name]  PERTINENT HISTORY: PD (diagnosed in 2012), CAD (s/p CABG 2006), HTN, a fib, pacemaker, GERD, on anticoagulants   PAIN:  Are you having pain? No  Vitals:   12/26/21 1034  BP: 135/69  Pulse: 62     PRECAUTIONS: Fall, ICD/Pacemaker, and Other: Wears a helmet, elbow and knees pads to help with falling, pt very HOH  PLOF: Independent with basic ADLs, Independent with household mobility with device, and Leisure: being outside in the yard, watches TV  PATIENT GOALS Pt and pt's wife want to work on pt getting off of his toes when walking and taking bigger steps.    OBJECTIVE:   TODAY'S TREATMENT:  Therapeutic Activity:  Pt reporting that he had a fall earlier this morning, pt with no pain, but did report that he might have hit his head. Pt with no symptoms of dizziness, confusion, headache etc. Discussed that if pt does develop any of these symptoms then would need medical management and would need to go to the ED. Pt and pt's spouse verbalized understanding of instructions. Discussed adding 2 more appts when he gets back from the beach to finish going over HEP, practice floor transfers. Reviewed freezing/festination strategies with pt and pt's spouse - importance of stopping during festination episodes and then putting heels down to the floor, and rocking through hips to help try to initiate taking a big step.   Access Code: [email or web address] URL: [email or web address]  Reviewed bolded exercises for HEP:   Exercises - Side Stepping with Counter Support  - 2 x daily - 5 x weekly - 2 sets - 10 reps - with BUE support,  cues for incr intensity and full weight shift when stepping, esp to the L side.  - Sit to Stand  with Armchair  - 1 x daily - 5 x weekly - 2 sets - 10 reps - Alternating Step Forward with Support  - 2 x daily - 5 x weekly - 2 sets - 10 reps - Seated Hamstring Stretch  - 1-2 x daily - 5 x weekly - 3 sets - 30 hold - Standing March with Counter Support  - 1 x daily - 7 x weekly - 3 sets - 10 reps - standing at locked U-Step Rollator, cues for posture, slowed pace and incr ROM when lifting leg into march (visual cues on how high to lift leg) - Toe Raise With Back Against Wall  - 1 x daily - 7 x weekly - 3 sets - 10 reps - Standing Anterior Toe Taps  - 1 x daily - 7 x weekly - 3 sets - 10 reps   Gait Training  Gait pattern: step to pattern, decreased step length- Right, decreased step length- Left, decreased stance time- Right, decreased stride length, decreased hip/knee flexion- Right, decreased hip/knee flexion- Left, decreased ankle dorsiflexion- Right, decreased ankle dorsiflexion- Left, shuffling, festinating, trunk flexed, narrow BOS, poor foot clearance- Right, and poor foot clearance- Left Distance walked: 115' x 2, plus into and out of session.  Assistive device utilized:  U-step Level of assistance: SBA and CGA Comments: Pt more fatigued today and ambulates with more shuffled steps and onto his toes with incr festination. Cues throughout gait for incr stride length and heel strike and using laser as a visual cue, with pt not able to respond to cues today. When pt did have festination episodes, did need cues to STOP and reset and get heels all the way down on floor and then rock through hips before initiating trying to take a big step, pt needing verbal/manual cues to help with weight shifting through hips.   With sit <> stands throughout, cues for importance of getting set up in proper position first with scooting out towards edge, wide BOS, tucking feet under, and nose over bullseye target on U-Step Rollator for incr forward lean. Pt needing cues throughout for proper technique, with pt  still having retropulsion and shifting weight onto his heels.     PATIENT EDUCATION: Education details: See above, plan to add 2 more appts to practice floor transfers and finalize pt's HEP.  Person educated: Patient and Spouse Education method: Explanation, Demonstration, and Verbal cues Education comprehension: verbalized understanding, returned demonstration, verbal cues required, and needs further education   HOME EXERCISE PROGRAM: Access Code: [email or web address] URL: [email or web address] Date: 11/26/2021 Prepared by: Mickie Bail Plaster  Exercises - Side Stepping with Counter Support  - 2 x daily - 5 x weekly - 2 sets - 10 reps - Sit to Stand with Armchair  - 1 x daily - 5 x weekly - 2 sets - 10 reps - Alternating Step Forward with Support  - 2 x daily - 5 x weekly - 2 sets - 10 reps - Seated Hamstring Stretch  - 1-2 x daily - 5 x weekly - 3 sets - 30 hold - Standing March with Counter Support  - 1 x daily - 7 x weekly - 3 sets - 10 reps - Toe Raise With Back Against Wall  - 1 x daily - 7 x weekly - 3 sets - 10 reps - Standing Anterior Toe Taps  - 1 x daily -  7 x weekly - 3 sets - 10 reps   GOALS: Goals reviewed with patient? Yes  SHORT TERM GOALS: Target date: 11/17/2021  Pt will perform initial HEP with supervision from pt's spouse for strength, balance, and transfers in order to build upon functional gains made in therapy. Baseline: Will need review from previous HEP Goal status: MET  2.  Pt and pt's spouse will verbalize understanding of techniques to help with freezing/festination episodes in order to demo improved safety with gait. Baseline: Will need further review.  Goal status: MET  3.  Pt will improve gait speed with RW vs. U step rollator to at least 1.3 ft/sec in order to demo improved community mobility.   Baseline: 30.28 seconds with RW = 1.08 ft/sec; 2.25 ft/s w/ U-step  Goal status: MET  4.  Pt will improve TUG time to 34 seconds or less with RW vs.  U-step walker in order to demo decrease fall risk, improved step length and improved ability to turn  Baseline: 40.84 seconds with RW, pt taking incr time to turn; 21.41s w/U-step, no freezing noted  Goal status: MET    LONG TERM GOALS: Target date: 12/26/21   Pt will perform final HEP with supervision from pt's spouse for strength, balance, and transfers in order to build upon functional gains made in therapy Baseline:  Goal status: INITIAL  2.  Pt will perform 5x sit <> stand in 17.5 seconds or less with UE support with proper technique and no BLE bracing in order to demo improved functional transfers and decr fall risk.  Baseline: 19.62 seconds;   19.63 seconds on 12/22/21 with proper technique Goal status: PARTIALLY MET  3.  Pt will improve gait speed with RW vs. U step rollator to at least 2.5 ft/sec in order to demo improved community mobility.  Baseline:  30.28 seconds with RW = 1.08 ft/sec; 2.25 ft/s w/U-step on 7/5   18.2 seconds with U-Step = 1.80 ft/sec on 12/22/21 Goal status: NOT MET  4.  Pt will improve 3MWT distance to at least 280' in order to demo improved gait efficiency and taking larger stride length in order to demo improved community/household mobility.  Baseline: 96' with RW; 115' with U-Step Rollator on 12/26/21 Goal status: NOT MET  5.   Pt and pt's spouse will verbalize understanding of fall prevention in the home.  Baseline:  Goal status: INITIAL  6.  Pt will improve TUG time to 18 seconds or less with RW vs. U-step walker in order to demo decrease fall risk, improved step length and improved ability to turn Baseline: 40.84 seconds with RW, pt taking incr time to turn; 21.41s w/U-step on 7/5   19.4 seconds on 12/22/21 with supervision  Goal status: NOT MET  Updated/ongoing LTGs for 12 week POC:  LONG TERM GOALS: Target date: 01/18/22  Pt will perform final HEP with supervision from pt's spouse for strength, balance, and transfers in order to build upon  functional gains made in therapy Baseline:  Goal status: ON-GOING  2.  Pt will improve 3MWT distance to at least 280' in order to demo improved gait efficiency and taking larger stride length in order to demo improved community/household mobility.  Baseline: 30' with RW; 115' with U-Step Rollator on 12/26/21 Goal status: ON-GOING  3. Pt and pt's spouse will verbalize understanding of fall prevention in the home.  Baseline:  Goal status: INITIAL    4. Pt will perform floor <> stand transfer with CGA/min A for  fall recovery.  Baseline:  Goal status: NEW  ASSESSMENT:  CLINICAL IMPRESSION: Pt having a more off day today due to fall earlier this morning and pt more fatigued. Pt reporting not injuring himself. Pt with incr freezing and festination episodes during gait and ambulating on his toes today and did not respond well to cues. Pt did not meet LTG #4 - was only able to ambulate 115' during 3MWT with U-Step rollator (previously was 30' with RW). Was originally planning to D/C today, but due to pt having an off day, discussed adding 2 more appts to finish finalizing/reviewing HEP and to work on floor transfers due to pt not being able to get up from floor with recent fall. Pt's spouse and pt in agreement with plan. Will continue to progress towards LTGs.    OBJECTIVE IMPAIRMENTS Abnormal gait, decreased activity tolerance, decreased balance, decreased coordination, decreased endurance, decreased knowledge of use of DME, decreased ROM, decreased strength, decreased safety awareness, impaired flexibility, and postural dysfunction.   ACTIVITY LIMITATIONS carrying, lifting, bending, standing, stairs, transfers, bed mobility, reach over head, and locomotion level  PARTICIPATION LIMITATIONS: cleaning, laundry, medication management, community activity, and yard work  PERSONAL FACTORS Age, Behavior pattern, Past/current experiences, Time since onset of injury/illness/exacerbation, and 3+  comorbidities: PD (diagnosed in 2012), CAD (s/p CABG 2006), HTN, a fib, pacemaker, GERD, on anticoagulants   are also affecting patient's functional outcome.   REHAB POTENTIAL: Good  CLINICAL DECISION MAKING: Evolving/moderate complexity  EVALUATION COMPLEXITY: Moderate  PLAN: PT FREQUENCY: 2x/week  PT DURATION: 12 weeks  PLANNED INTERVENTIONS: Therapeutic exercises, Therapeutic activity, Neuromuscular re-education, Balance training, Gait training, Patient/Family education, and DME instructions  PLAN FOR NEXT SESSION:  Finish checking LTGs, finalize HEP, practice floor transfers, schedule PD evals in 6 months.   Arliss Journey, PT, DPT  12/26/2021, 10:26 AM

## 2022-01-01 ENCOUNTER — Other Ambulatory Visit: Payer: Self-pay | Admitting: Neurology

## 2022-01-01 DIAGNOSIS — G20 Parkinson's disease: Secondary | ICD-10-CM

## 2022-01-02 ENCOUNTER — Other Ambulatory Visit: Payer: Self-pay

## 2022-01-02 DIAGNOSIS — G20A1 Parkinson's disease without dyskinesia, without mention of fluctuations: Secondary | ICD-10-CM

## 2022-01-02 DIAGNOSIS — G20 Parkinson's disease: Secondary | ICD-10-CM

## 2022-01-02 MED ORDER — CARBIDOPA-LEVODOPA 25-100 MG PO TABS: ORAL_TABLET | ORAL | 0 refills | Status: DC

## 2022-01-05 ENCOUNTER — Telehealth: Payer: Self-pay | Admitting: Neurology

## 2022-01-05 ENCOUNTER — Other Ambulatory Visit: Payer: Self-pay | Admitting: Neurology

## 2022-01-05 DIAGNOSIS — G20 Parkinson's disease: Secondary | ICD-10-CM

## 2022-01-05 NOTE — Telephone Encounter (Signed)
1. Which medications need refilled? (List name and dosage, if known) carbidopa-levodopa 25-100 mg  2. Which pharmacy/location is medication to be sent to? (include street and city if local pharmacy) [facility]  Pt only has enough for tomorrow. [facility] called today to tell them we needed to send a new presription.

## 2022-01-05 NOTE — Telephone Encounter (Signed)
Refill sent in on 8/18 for pt [facility] has the prescription, pt wife called informed he will be getting his medication today.

## 2022-01-08 NOTE — Progress Notes (Signed)
Remote pacemaker transmission.   

## 2022-01-12 ENCOUNTER — Ambulatory Visit: Payer: PPO | Admitting: Physical Therapy

## 2022-01-12 ENCOUNTER — Encounter: Payer: Self-pay | Admitting: Physical Therapy

## 2022-01-12 ENCOUNTER — Ambulatory Visit: Payer: PPO | Attending: Internal Medicine

## 2022-01-12 DIAGNOSIS — R293 Abnormal posture: Secondary | ICD-10-CM

## 2022-01-12 DIAGNOSIS — Z5181 Encounter for therapeutic drug level monitoring: Secondary | ICD-10-CM | POA: Diagnosis not present

## 2022-01-12 DIAGNOSIS — R2681 Unsteadiness on feet: Secondary | ICD-10-CM | POA: Diagnosis not present

## 2022-01-12 DIAGNOSIS — R2689 Other abnormalities of gait and mobility: Secondary | ICD-10-CM

## 2022-01-12 DIAGNOSIS — M6281 Muscle weakness (generalized): Secondary | ICD-10-CM

## 2022-01-12 LAB — POCT INR: INR: 3.8 — AB (ref 2.0–3.0)

## 2022-01-12 NOTE — Patient Instructions (Signed)
continue taking 1 tablet daily except 1/2 tablet on Sundays and Thursdays.  Recheck INR in 6 weeks. [facility] [place] TODAY. Call [phone] call with any new medications or if scheduled for any other procedures.  Clearance/Procedure Fax #[phone]

## 2022-01-12 NOTE — Therapy (Signed)
OUTPATIENT PHYSICAL THERAPY NEURO TREATMENT/10th Visit Progress Note/Discharge Summary   Patient Name: Donald [name] MRN: [id number] DOB:Oct 04, 1940, 81 y.o., male Today's Date: 01/12/2022  10th Visit Physical Therapy Progress Note  Dates of Reporting Period: 11/28/21 to 01/12/22   PCP: Haywood Pao, MD REFERRING PROVIDER: Ludwig Clarks, DO    PT End of Session - 01/12/22 1236     Visit Number 20    Number of Visits 21    Date for PT Re-Evaluation 01/18/22   due to potential delay in scheduling   Authorization Type HTA    PT Start Time 7342    PT Stop Time 1313    PT Time Calculation (min) 42 min    Equipment Utilized During Treatment Gait belt    Activity Tolerance Patient tolerated treatment well;Patient limited by fatigue    Behavior During Therapy [facility] for tasks assessed/performed                 Past Medical History:  Diagnosis Date   Abnormality of gait 08/27/2014   Anxiety    Aortic stenosis    s/p AVR by Dr Cyndia Bent   Arthritis    left wrist- probable- not diagonised   Asthma    in the past   CAD (coronary artery disease)    s/p CABG 2006   GERD (gastroesophageal reflux disease)    H/O seasonal allergies    H/O: rheumatic fever    Hearing deficit    Bilateral hearing aids   Hematuria    had CT scan   Hiatal hernia    History of kidney stones    HOH (hard of hearing)    Hyperlipidemia    Hypertension    Hyperthyroidism    Inguinal hernia    right side; watching at this time per wife's report   Memory difficulty 08/27/2014   Pacemaker    Parkinson's disease    Persistent atrial fibrillation ([place])    Scarlet fever    as a child   Shortness of breath    Sick sinus syndrome ([place])    s/p PPM (MDT) 2006   Past Surgical History:  Procedure Laterality Date   AORTIC VALVE REPLACEMENT  2006   CARDIOVERSION N/A 09/14/2013   Procedure: CARDIOVERSION;  Surgeon: Sanda Klein, MD;  Location: [facility];  Service: Cardiovascular;  Laterality:  N/A;   CHOLECYSTECTOMY N/A 07/21/2013   Procedure: LAPAROSCOPIC CHOLECYSTECTOMY;  Surgeon: Edward Jolly, MD;  Location: [facility] OR;  Service: General;  Laterality: N/A;   COLONOSCOPY W/ POLYPECTOMY     COLONOSCOPY WITH PROPOFOL N/A 08/31/2016   Procedure: COLONOSCOPY WITH PROPOFOL;  Surgeon: Wilford Corner, MD;  Location: [facility];  Service: Endoscopy;  Laterality: N/A;   CORONARY ARTERY BYPASS GRAFT  2006   DENTAL SURGERY     implants   INSERT / REPLACE / REMOVE PACEMAKER  2006, 2012   MDT for sick sinus syndrome, gen change 8/12 by [place]   Patient Active Problem List   Diagnosis Date Noted   HOH (hard of hearing) 12/18/2020   Personal history of colonic polyps 08/31/2016   Abnormality of gait 08/27/2014   Memory difficulty 08/27/2014   Permanent atrial fibrillation ([place]) 09/08/2013   Cholelithiases 07/19/2013   Cholelithiasis 07/19/2013   Encounter for therapeutic drug monitoring 07/03/2013   Mechanical aortic valve 04/05/2013   Parkinson disease ([place]) 09/27/2012   Orthostatic hypotension 04/01/2012   Pacemaker-[employer] 03/29/2012   HEPATITIS A 06/04/2010  HYPERTHYROIDISM 06/04/2010   HYPERLIPIDEMIA 06/04/2010   Essential hypertension 06/04/2010   Coronary artery disease involving native coronary artery of native heart without angina pectoris 06/04/2010   SICK SINUS SYNDROME 06/04/2010   HIATAL HERNIA 06/04/2010   SHORTNESS OF BREATH 06/04/2010   CHEST PAIN 06/04/2010    ONSET DATE: 09/25/2021  REFERRING DIAG: G20 (ICD-10-CM) - Parkinson's disease ([place])  THERAPY DIAG:  Unsteadiness on feet  Other abnormalities of gait and mobility  Abnormal posture  Muscle weakness (generalized)  Rationale for Evaluation and Treatment Rehabilitation  SUBJECTIVE:                                                                                                                                                                                               SUBJECTIVE STATEMENT: Had 3 falls since he was last here. Didn't get hurt.  Sees the cardiologist on Wednesday.   Pt accompanied by: significant other [name]  PERTINENT HISTORY: PD (diagnosed in 2012), CAD (s/p CABG 2006), HTN, a fib, pacemaker, GERD, on anticoagulants   PAIN:  Are you having pain? No  There were no vitals filed for this visit.    PRECAUTIONS: Fall, ICD/Pacemaker, and Other: Wears a helmet, elbow and knees pads to help with falling, pt very HOH  PLOF: Independent with basic ADLs, Independent with household mobility with device, and Leisure: being outside in the yard, watches TV  PATIENT GOALS Pt and pt's wife want to work on pt getting off of his toes when walking and taking bigger steps.    OBJECTIVE:   TODAY'S TREATMENT:  Therapeutic Activity:  Performed floor to stand transfers due to pt's recent fall and could not get up from the floor. Pt had a fall earlier today however and was able to get back up with wife's supervision. Reviewed technique and had pt stand up and hold onto chair while turning, cues for incr foot clearance/marching when turning as pt taking small steps onto his toes. Had pt face the mat table and then lower down slowly into half kneel > tall kneel with supervision. From here had pt sit on his bottom and then come back up into the same position. Pt needing cues to perform slowly from tall kneel and then bring either leg forward with a deliberate movement into half kneel before standing up as pt with tendency to move too quickly and to not put his foot all the way on the floor. Pt needing min guard when standing up and needs BUE support to press up from the mat. Discussed importance of making sure that pt gets near a solid object to  stand up with wife's supervision. Performed x3 reps with pt and wife verbalizing understanding.   Discussed scheduling return PD evals in 6 months. Pt and pt's spouse would prefer just to do PT as all 3  disciplines would be too much. Discussed that if pt feels like he needs ST/OT, can always get a new referral from Dr. Carles Collet    Reviewed remainder of HEP (see bolded exercises under HEP below).    Gait Training  Gait pattern: step to pattern, decreased step length- Right, decreased step length- Left, decreased stance time- Right, decreased stride length, decreased hip/knee flexion- Right, decreased hip/knee flexion- Left, decreased ankle dorsiflexion- Right, decreased ankle dorsiflexion- Left, shuffling, festinating, trunk flexed, narrow BOS, poor foot clearance- Right, and poor foot clearance- Left Distance walked: Into and out of session.  Assistive device utilized:  U-step Level of assistance: SBA and CGA Comments: Cued for longer stride length and stepping his foot/heel to the laser. Pt did improve with heel strike at end of session and when cued for heel to the laser. Pt with an episode of festination going through the doorway with cues needed to stop and reset with tall posture and get feet flat on the floor before taking a big step.   With sit <> stands throughout, cues for importance of getting set up in proper position first with scooting out towards edge, wide BOS, tucking feet under, and nose over bullseye target on U-Step Rollator for incr forward lean. Pt needing initial reminders for incr forward lean.    PATIENT EDUCATION: Education details: See therapeutic activity section. Review of HEP and importance of continuing to perform.  Person educated: Patient and Spouse Education method: Explanation, Demonstration, and Verbal cues Education comprehension: verbalized understanding, returned demonstration, verbal cues required, and needs further education   HOME EXERCISE PROGRAM: Access Code: [email or web address] URL: [email or web address] Date: 11/26/2021 Prepared by: Mickie Bail Plaster  Exercises - Side Stepping with Counter Support  - 2 x daily - 5 x weekly - 2 sets - 10 reps -  Sit to Stand with Armchair  - 1 x daily - 5 x weekly - 2 sets - 10 reps - Alternating Step Forward with Support  - 2 x daily - 5 x weekly - 2 sets - 10 reps - cues for deliberate step  - Seated Hamstring Stretch  - 1-2 x daily - 5 x weekly - 3 sets - 30 hold - Standing March with Counter Support  - 1 x daily - 7 x weekly - 3 sets - 10 reps - holding onto locked rollator, cues for slowed pace and incr ROM  - Toe Raise With Back Against Wall  - 1 x daily - 7 x weekly - 3 sets - 10 reps - cues for just weight shifting posteriorly.  - Standing Anterior Toe Taps  - 1 x daily - 7 x weekly - 3 sets - 10 reps - performed at rollator, cued to perform heel tap to laser deliberately and to perform slowly, pt with more difficulty performing with RLE. Discussed importance of performing daily.     PHYSICAL THERAPY DISCHARGE SUMMARY  Visits from Start of Care: 20  Current functional level related to goals / functional outcomes: See LTGs/clinical assessment statement.    Remaining deficits: Decr timing/coordination of gait, impaired balance, impulsivity, cognitive/hearing deficits, decr functional mobility, bradykinesia, postural instability.   Education / Equipment: HEP, techniques/strategies to help with gait quality and freezing/festination episodes, sit <> stand transfer technique, fall prevention.   Patient  agrees to discharge. Patient goals were partially met. Patient is being discharged due to maximized rehab potential.  Will schedule return PD evals in 6 months.    GOALS: Goals reviewed with patient? Yes  SHORT TERM GOALS: Target date: 11/17/2021  Pt will perform initial HEP with supervision from pt's spouse for strength, balance, and transfers in order to build upon functional gains made in therapy. Baseline: Will need review from previous HEP Goal status: MET  2.  Pt and pt's spouse will verbalize understanding of techniques to help with freezing/festination episodes in order to demo  improved safety with gait. Baseline: Will need further review.  Goal status: MET  3.  Pt will improve gait speed with RW vs. U step rollator to at least 1.3 ft/sec in order to demo improved community mobility.   Baseline: 30.28 seconds with RW = 1.08 ft/sec; 2.25 ft/s w/ U-step  Goal status: MET  4.  Pt will improve TUG time to 34 seconds or less with RW vs. U-step walker in order to demo decrease fall risk, improved step length and improved ability to turn  Baseline: 40.84 seconds with RW, pt taking incr time to turn; 21.41s w/U-step, no freezing noted  Goal status: MET    LONG TERM GOALS: Target date: 12/26/21   Pt will perform final HEP with supervision from pt's spouse for strength, balance, and transfers in order to build upon functional gains made in therapy Baseline:  Goal status: INITIAL  2.  Pt will perform 5x sit <> stand in 17.5 seconds or less with UE support with proper technique and no BLE bracing in order to demo improved functional transfers and decr fall risk.  Baseline: 19.62 seconds;   19.63 seconds on 12/22/21 with proper technique Goal status: PARTIALLY MET  3.  Pt will improve gait speed with RW vs. U step rollator to at least 2.5 ft/sec in order to demo improved community mobility.  Baseline:  30.28 seconds with RW = 1.08 ft/sec; 2.25 ft/s w/U-step on 7/5   18.2 seconds with U-Step = 1.80 ft/sec on 12/22/21 Goal status: NOT MET  4.  Pt will improve 3MWT distance to at least 280' in order to demo improved gait efficiency and taking larger stride length in order to demo improved community/household mobility.  Baseline: 41' with RW; 115' with U-Step Rollator on 12/26/21 Goal status: NOT MET  5.   Pt and pt's spouse will verbalize understanding of fall prevention in the home.  Baseline:  Goal status: INITIAL  6.  Pt will improve TUG time to 18 seconds or less with RW vs. U-step walker in order to demo decrease fall risk, improved step length and improved  ability to turn Baseline: 40.84 seconds with RW, pt taking incr time to turn; 21.41s w/U-step on 7/5   19.4 seconds on 12/22/21 with supervision  Goal status: NOT MET  Updated/ongoing LTGs for 12 week POC:  LONG TERM GOALS: Target date: 01/18/22  Pt will perform final HEP with supervision from pt's spouse for strength, balance, and transfers in order to build upon functional gains made in therapy Baseline:  Goal status: PARTIALLY MET  2.  Pt will improve 3MWT distance to at least 280' in order to demo improved gait efficiency and taking larger stride length in order to demo improved community/household mobility.  Baseline: 76' with RW; 115' with U-Step Rollator on 12/26/21, did not have the chance to assess on 01/12/22 Goal status: ON-GOING  3. Pt and pt's spouse will verbalize  understanding of fall prevention in the home.  Baseline:  Goal status: MET     4. Pt will perform floor <> stand transfer with CGA/min A for fall recovery.  Baseline:  Goal status: MET  ASSESSMENT:  CLINICAL IMPRESSION: 10th visit PN: Performed floor <> stand transfers for fall recovery at home with pt able to perform with supervision/min guard and cues for proper technique/slowing down. Reviewed pt's HEP to perform at home for balance, weight shifting, heel strike, sit <> stands. Pt able to demo understanding and educated on importance of performing at home with wife's cues to allow for proper technique and intensity. Reviewed technique for sit <> stands, cues/strategies for gait training for larger stride lengths, and strategies for festination episodes. Pt varies on days where he is able to incr her stride length/foot clearance with cues to step to the laser on his U-Step Rollator and other days has incr festination episodes where pt does not respond well to cues. From this bout of therapy, pt overall does demonstrate improvements with his gait using his U-Step Rollator vs. A standard RW. Pt has reached his maximum  potential with OPPT at this time and will schedule a return PD PT eval in 6 months. Pt and pt's spouse in agreement with plan.    OBJECTIVE IMPAIRMENTS Abnormal gait, decreased activity tolerance, decreased balance, decreased coordination, decreased endurance, decreased knowledge of use of DME, decreased ROM, decreased strength, decreased safety awareness, impaired flexibility, and postural dysfunction.   ACTIVITY LIMITATIONS carrying, lifting, bending, standing, stairs, transfers, bed mobility, reach over head, and locomotion level  PARTICIPATION LIMITATIONS: cleaning, laundry, medication management, community activity, and yard work  PERSONAL FACTORS Age, Behavior pattern, Past/current experiences, Time since onset of injury/illness/exacerbation, and 3+ comorbidities: PD (diagnosed in 2012), CAD (s/p CABG 2006), HTN, a fib, pacemaker, GERD, on anticoagulants   are also affecting patient's functional outcome.   REHAB POTENTIAL: Good  CLINICAL DECISION MAKING: Evolving/moderate complexity  EVALUATION COMPLEXITY: Moderate  PLAN: PT FREQUENCY: 2x/week  PT DURATION: 12 weeks  PLANNED INTERVENTIONS: Therapeutic exercises, Therapeutic activity, Neuromuscular re-education, Balance training, Gait training, Patient/Family education, and DME instructions  PLAN FOR NEXT SESSION: D/C   Arliss Journey, PT, DPT  01/12/2022, 4:19 PM

## 2022-01-13 ENCOUNTER — Telehealth: Payer: Self-pay | Admitting: Neurology

## 2022-01-13 NOTE — Telephone Encounter (Signed)
-----   Message from Octaviano Batty Jahniyah Revere, DO sent at 12/05/2021  1:52 PM EDT ----- Call patient/wife (patient cannot hear well) and find out how he did with the botox for drooling.  Did it help?  Does he want to continue?

## 2022-01-13 NOTE — Progress Notes (Unsigned)
Office Visit    Patient Name: Donald [name] Date of Encounter: 01/14/2022  PCP:  Corwin Levins, MD   [facility]  Cardiologist:  Donato Schultz, MD  Advanced Practice Provider:  No care team member to display Electrophysiologist:  Hillis Range, MD    HPI    Donald [name] is a 81 y.o. male with past medical history significant for hypertension, hyperlipidemia, atrial fibrillation on Coumadin, and aortic stenosis status post aortic valve replacement and CABG 2006 presents today for 36-month follow-up visit.  He saw Otilio Saber 07/01/2021 and was doing well at that time.  Most recent echo showed LVEF 50 to 55%, aortic dilation to 39 mm, mean gradient through mechanical AV was 35 and 22 mmHg.  He was referred to general cardiology at that point.  08/13/2021 he was seen by Dr.  Tyler Aas he is doing well.  He does have a history of falls.  He has had a pacemaker over 3 years.  He is tolerating his medications.  He reported some numbness at the tips of his fingers that was causing him to drop objects.  An echocardiogram was obtained and it was found that he has moderate aortic stenosis.  He was referred to the structural heart team.  Today, he feels pretty good.  He had 1 episode of dizziness a couple of weeks ago.  He has not had any symptoms.  He denies any chest pain or shortness of breath.  He never got to see the structural heart folks for his aortic stenosis.  We will send another referral today.  Blood pressure still low normal.  He does come in with some blood pressure readings with systolic in the 80s.  We discussed increasing water intake to 64 ounces a day as well as incorporating some sodium into his diet.  We discussed also potentially needing medications to increase blood pressure but we want to try conservative measures first.  Otherwise, he is really not on any cardiac medicines.  He is taking Coumadin for his mechanical aortic valve and most recent INR was 3.8.   He was told to eat some leafy greens.  Goal INR is 2.5-3.5.  Reports no shortness of breath nor dyspnea on exertion. Reports no chest pain, pressure, or tightness. No edema, orthopnea, PND. Reports no palpitations.    Past Medical History    Past Medical History:  Diagnosis Date   Abnormality of gait 08/27/2014   Anxiety    Aortic stenosis    s/p AVR by Dr Laneta Simmers   Arthritis    left wrist- probable- not diagonised   Asthma    in the past   CAD (coronary artery disease)    s/p CABG 2006   GERD (gastroesophageal reflux disease)    H/O seasonal allergies    H/O: rheumatic fever    Hearing deficit    Bilateral hearing aids   Hematuria    had CT scan   Hiatal hernia    History of kidney stones    HOH (hard of hearing)    Hyperlipidemia    Hypertension    Hyperthyroidism    Inguinal hernia    right side; watching at this time per wife's report   Memory difficulty 08/27/2014   Pacemaker    Parkinson's disease    Persistent atrial fibrillation (HCC)    Scarlet fever    as a child   Shortness of breath    Sick sinus syndrome (HCC)  s/p PPM (MDT) 2006   Past Surgical History:  Procedure Laterality Date   AORTIC VALVE REPLACEMENT  2006   CARDIOVERSION N/A 09/14/2013   Procedure: CARDIOVERSION;  Surgeon: Thurmon Fair, MD;  Location: [facility] ENDOSCOPY;  Service: Cardiovascular;  Laterality: N/A;   CHOLECYSTECTOMY N/A 07/21/2013   Procedure: LAPAROSCOPIC CHOLECYSTECTOMY;  Surgeon: Mariella Saa, MD;  Location: [facility] OR;  Service: General;  Laterality: N/A;   COLONOSCOPY W/ POLYPECTOMY     COLONOSCOPY WITH PROPOFOL N/A 08/31/2016   Procedure: COLONOSCOPY WITH PROPOFOL;  Surgeon: Charlott Rakes, MD;  Location: [facility] ENDOSCOPY;  Service: Endoscopy;  Laterality: N/A;   CORONARY ARTERY BYPASS GRAFT  2006   DENTAL SURGERY     implants   INSERT / REPLACE / REMOVE PACEMAKER  2006, 2012   MDT for sick sinus syndrome, gen change 8/12 by JA   KIDNEY STONE SURGERY  1984     Allergies  Allergies  Allergen Reactions   Sulfa Antibiotics     unknown    EKGs/Labs/Other Studies Reviewed:   The following studies were reviewed today: IMPRESSIONS     1. Septal motion consistent with conduction delay.. Left ventricular  ejection fraction, by estimation, is 50 to 55%. The left ventricle has low  normal function. There is mild left ventricular hypertrophy. Left  ventricular diastolic parameters are  indeterminate.   2. Right ventricular systolic function is normal. The right ventricular  size is mildly enlarged. There is moderately elevated pulmonary artery  systolic pressure.   3. Left atrial size was severely dilated.   4. Right atrial size was severely dilated.   5. Mild mitral valve regurgitation.   6. S/P AVR (2006) with mechanical AVR. Unable to find any further  information. Peak and mean gradients through the valve are 35 and 23 mm  Hg. Dimensionless index is 0.3 consistent with moderate AS.   7. Aortic dilatation noted. There is mild dilatation of the ascending  aorta, measuring 39 mm.   8. The inferior vena cava is dilated in size with <50% respiratory  variability, suggesting right atrial pressure of 15 mmHg.   FINDINGS   Left Ventricle: Septal motion consistent with conduction delay. Left  ventricular ejection fraction, by estimation, is 50 to 55%. The left  ventricle has low normal function. The left ventricular internal cavity  size was normal in size. There is mild left   ventricular hypertrophy. Left ventricular diastolic parameters are  indeterminate.   Right Ventricle: The right ventricular size is mildly enlarged. Right  vetricular wall thickness was not assessed. Right ventricular systolic  function is normal. There is moderately elevated pulmonary artery systolic  pressure. The tricuspid regurgitant  velocity is 3.44 m/s, and with an assumed right atrial pressure of 8 mmHg,  the estimated right ventricular systolic pressure  is 55.3 mmHg.   Left Atrium: Left atrial size was severely dilated.   Right Atrium: Right atrial size was severely dilated.   Pericardium: Trivial pericardial effusion is present.   Mitral Valve: There is mild thickening of the mitral valve leaflet(s).  Mild mitral valve regurgitation.   Tricuspid Valve: The tricuspid valve is normal in structure. Tricuspid  valve regurgitation is mild.   Aortic Valve: S/P AVR (2006) with mechanical AVR. Unable to find any  further information. Peak and mean gradients through the valve are 35 and  23 mm Hg. Dimensionless index is 0.3 consistent with moderate AS. The  aortic valve has been repaired/replaced.  Aortic valve regurgitation is mild. Aortic regurgitation PHT  measures 406  msec. Aortic valve mean gradient measures 23.0 mmHg. Aortic valve peak  gradient measures 37.7 mmHg. Aortic valve area, by VTI measures 0.93 cm.  There is a mechanical valve present   in the aortic position. Procedure Date: 2006.   Pulmonic Valve: The pulmonic valve was normal in structure. Pulmonic valve  regurgitation is mild.   Aorta: The aortic root is normal in size and structure and aortic  dilatation noted. There is mild dilatation of the ascending aorta,  measuring 39 mm.   Venous: The inferior vena cava is dilated in size with less than 50%  respiratory variability, suggesting right atrial pressure of 15 mmHg.   IAS/Shunts: No atrial level shunt detected by color flow Doppler.   Additional Comments: A device lead is visualized.   EKG:  EKG is not ordered today.    Recent Labs: 07/01/2021: ALT 7; BUN 13; Creatinine, Ser 0.62; Hemoglobin 11.9; Platelets 235; Potassium 4.5; Sodium 139  Recent Lipid Panel No results found for: "CHOL", "TRIG", "HDL", "CHOLHDL", "VLDL", "LDLCALC", "LDLDIRECT"  Risk Assessment/Calculations:   CHA2DS2-VASc Score = 4   This indicates a 4.8% annual risk of stroke. The patient's score is based upon: CHF History: 0 HTN  History: 1 Diabetes History: 0 Stroke History: 0 Vascular Disease History: 1 Age Score: 2 Gender Score: 0     Home Medications   Current Meds  Medication Sig   amoxicillin (AMOXIL) 500 MG capsule Take 4 capsules by mouth as directed. Prior to dental appts   aspirin 81 MG tablet Take 81 mg by mouth daily.   atorvastatin (LIPITOR) 20 MG tablet TAKE ONE TABLET BY MOUTH EVERYDAY AT BEDTIME   carbidopa-levodopa (SINEMET CR) 50-200 MG tablet TAKE ONE TABLET BY MOUTH EVERYDAY AT BEDTIME   esomeprazole (NEXIUM) 20 MG capsule Take 40 mg by mouth daily.   levothyroxine (SYNTHROID, LEVOTHROID) 125 MCG tablet Take 125 mcg by mouth daily.   nitroGLYCERIN (NITROSTAT) 0.4 MG SL tablet PLACE 1 TABLET UNDER TONGUE EVERY 5 MINUTES FOR 3 DOSES AS NEEDED FOR CHEST PAIN   rOPINIRole (REQUIP) 2 MG tablet 2 mg at 7am, 2 mg at noon, 2 mg at 5pm   warfarin (COUMADIN) 5 MG tablet TAKE 1/2 TO 1 TABLET BY MOUTH DAILY OR AS DIRECTED by coumadin clinic     Review of Systems      All other systems reviewed and are otherwise negative except as noted above.  Physical Exam    VS:  BP 104/70   Pulse 72   Ht 5\' 11"  (1.803 m)   Wt 142 lb (64.4 kg)   SpO2 97%   BMI 19.80 kg/m  , BMI Body mass index is 19.8 kg/m.  Wt Readings from Last 3 Encounters:  01/14/22 142 lb (64.4 kg)  10/24/21 158 lb (71.7 kg)  08/13/21 151 lb (68.5 kg)     GEN: Well nourished, well developed, in no acute distress. HEENT: normal. Neck: Supple, no JVD, carotid bruits, or masses. Cardiac: irregularly irregular, no murmurs, rubs, or gallops. No clubbing, cyanosis, edema.  Radials/PT 2+ and equal bilaterally.  Respiratory:  Respirations regular and unlabored, clear to auscultation bilaterally. GI: Soft, nontender, nondistended. MS: No deformity or atrophy. Skin: Warm and dry, no rash. Neuro:  Strength and sensation are intact. Psych: Normal affect.  Assessment & Plan    Moderate AS -structural heart referral -one episode of  dizziness but BP has also been as low as 80s systolic  Coronary artery disease -no chest pain -s/p  CABG in 2006 -Continue current medication regimen with aspirin 81 mg daily, Lipitor 20 mg daily, Coumadin 5 mg as directed by the Coumadin clinic, metoprolol discontinued due to hypotension  Orthostatic hypotension -One episode a few weeks ago -Discussed 64 ounces of water fluid intake and incorporating a little bit of sodium -Discussed compression socks/hose from elastic therapy.  Handout provided. -We will try these conservative measures first and then may need to start midodrine if still having issues  Parkinson's disease -Continue current dose of carbidopa-levodopa  Pacemaker ([employer]) -no recent issues  Mechanical aortic valve -on coumadin and monitored by coumadin clinic -Most recent INR 3.8  Permanent atrial fibrillation -CHA2DS2-VASc score of 4 -Patient is asymptomatic at this time -Continue anticoagulation -Rate controlled in the 70s today   Disposition: Follow up 6 months with Donato Schultz, MD or APP.  Signed, Sharlene Dory, PA-C 01/14/2022, 12:23 PM [facility]

## 2022-01-13 NOTE — Telephone Encounter (Signed)
I spoke to patients wife and she said it is helping but now the patient is having dry mouth. She would like to speak to him and call me back to let me know if he thinks its helping him and if he wants to continue

## 2022-01-14 ENCOUNTER — Encounter: Payer: Self-pay | Admitting: Physician Assistant

## 2022-01-14 ENCOUNTER — Ambulatory Visit: Payer: PPO | Attending: Physician Assistant | Admitting: Physician Assistant

## 2022-01-14 VITALS — BP 104/70 | HR 72 | Ht 71.0 in | Wt 142.0 lb

## 2022-01-14 DIAGNOSIS — Z952 Presence of prosthetic heart valve: Secondary | ICD-10-CM

## 2022-01-14 DIAGNOSIS — Z950 Presence of cardiac pacemaker: Secondary | ICD-10-CM

## 2022-01-14 DIAGNOSIS — I951 Orthostatic hypotension: Secondary | ICD-10-CM | POA: Diagnosis not present

## 2022-01-14 DIAGNOSIS — I4821 Permanent atrial fibrillation: Secondary | ICD-10-CM | POA: Diagnosis not present

## 2022-01-14 DIAGNOSIS — G20 Parkinson's disease: Secondary | ICD-10-CM

## 2022-01-14 DIAGNOSIS — I2510 Atherosclerotic heart disease of native coronary artery without angina pectoris: Secondary | ICD-10-CM

## 2022-01-14 DIAGNOSIS — I350 Nonrheumatic aortic (valve) stenosis: Secondary | ICD-10-CM

## 2022-01-14 NOTE — Patient Instructions (Addendum)
Medication Instructions:  Your physician recommends that you continue on your current medications as directed. Please refer to the Current Medication list given to you today.  *If you need a refill on your cardiac medications before your next appointment, please call your pharmacy*   Lab Work: None If you have labs (blood work) drawn today and your tests are completely normal, you will receive your results only by: MyChart Message (if you have MyChart) OR A paper copy in the mail If you have any lab test that is abnormal or we need to change your treatment, we will call you to review the results.  Follow-Up: At [facility], you and your health needs are our priority.  As part of our continuing mission to provide you with exceptional heart care, we have created designated Provider Care Teams.  These Care Teams include your primary Cardiologist (physician) and Advanced Practice Providers (APPs -  Physician Assistants and Nurse Practitioners) who all work together to provide you with the care you need, when you need it.  Your next appointment:   6 month(s)  The format for your next appointment:   In Person  Provider:   Donato Schultz, MD   You have been referred to the structural heart clinic.   Orthostatic Hypotension Blood pressure is a measurement of how strongly, or weakly, your circulating blood is pressing against the walls of your arteries. Orthostatic hypotension is a drop in blood pressure that can happen when you change positions, such as when you go from lying down to standing. Arteries are blood vessels that carry blood from your heart throughout your body. When blood pressure is too low, you may not get enough blood to your brain or to the rest of your organs. Orthostatic hypotension can cause light-headedness, sweating, rapid heartbeat, blurred vision, and fainting. These symptoms require further investigation into the cause. What are the causes? Orthostatic  hypotension can be caused by many things, including: Sudden changes in posture, such as standing up quickly after you have been sitting or lying down. Loss of blood (anemia) or loss of body fluids (dehydration). Heart problems, neurologic problems, or hormone problems. Pregnancy. Aging. The risk for this condition increases as you get older. Severe infection (sepsis). Certain medicines, such as medicines for high blood pressure or medicines that make the body lose excess fluids (diuretics). What are the signs or symptoms? Symptoms of this condition may include: Weakness, light-headedness, or dizziness. Sweating. Blurred vision. Tiredness (fatigue). Rapid heartbeat. Fainting, in severe cases. How is this diagnosed? This condition is diagnosed based on: Your symptoms and medical history. Your blood pressure measurements. Your health care provider will check your blood pressure when you are: Lying down. Sitting. Standing. A blood pressure reading is recorded as two numbers, such as "120 over 80" (or 120/80). The first ("top") number is called the systolic pressure. It is a measure of the pressure in your arteries as your heart beats. The second ("bottom") number is called the diastolic pressure. It is a measure of the pressure in your arteries when your heart relaxes between beats. Blood pressure is measured in a unit called mmHg. Healthy blood pressure for most adults is 120/80 mmHg. Orthostatic hypotension is defined as a 20 mmHg drop in systolic pressure or a 10 mmHg drop in diastolic pressure within 3 minutes of standing. Other information or tests that may be used to diagnose orthostatic hypotension include: Your other vital signs, such as your heart rate and temperature. Blood tests. An electrocardiogram (  ECG) or echocardiogram. A Holter monitor. This is a device you wear that records your heart rhythm continuously, usually for 24-48 hours. Tilt table test. For this test, you will be  safely secured to a table that moves you from a lying position to an upright position. Your heart rhythm and blood pressure will be monitored during the test. How is this treated? This condition may be treated by: Changing your diet. This may involve eating more salt (sodium) or drinking more water. Changing the dosage of certain medicines you are taking that might be lowering your blood pressure. Correcting the underlying reason for the orthostatic hypotension. Wearing compression stockings. Taking medicines to raise your blood pressure. Avoiding actions that trigger symptoms. Follow these instructions at home: Medicines Take over-the-counter and prescription medicines only as told by your health care provider. Follow instructions from your health care provider about changing the dosage of your current medicines, if this applies. Do not stop or adjust any of your medicines on your own. Eating and drinking  Drink enough fluid to keep your urine pale yellow. Eat extra salt only as directed. Do not add extra salt to your diet unless advised by your health care provider. Eat frequent, small meals. Avoid standing up suddenly after eating. General instructions  Get up slowly from lying down or sitting positions. This gives your blood pressure a chance to adjust. Avoid hot showers and excessive heat as directed by your health care provider. Engage in regular physical activity as directed by your health care provider. If you have compression stockings, wear them as told. Keep all follow-up visits. This is important. Contact a health care provider if: You have a fever for more than 2-3 days. You feel more thirsty than usual. You feel dizzy or weak. Get help right away if: You have chest pain. You have a fast or irregular heartbeat. You become sweaty or feel light-headed. You feel short of breath. You faint. You have any symptoms of a stroke. "BE FAST" is an easy way to remember the main  warning signs of a stroke: B - Balance. Signs are dizziness, sudden trouble walking, or loss of balance. E - Eyes. Signs are trouble seeing or a sudden change in vision. F - Face. Signs are sudden weakness or numbness of the face, or the face or eyelid drooping on one side. A - Arms. Signs are weakness or numbness in an arm. This happens suddenly and usually on one side of the body. S - Speech. Signs are sudden trouble speaking, slurred speech, or trouble understanding what people say. T - Time. Time to call emergency services. Write down what time symptoms started. You have other signs of a stroke, such as: A sudden, severe headache with no known cause. Nausea or vomiting. Seizure. These symptoms may represent a serious problem that is an emergency. Do not wait to see if the symptoms will go away. Get medical help right away. Call your local emergency services (911 in the [place] Do not drive yourself to the hospital. Summary Orthostatic hypotension is a sudden drop in blood pressure. It can cause light-headedness, sweating, rapid heartbeat, blurred vision, and fainting. Orthostatic hypotension can be diagnosed by having your blood pressure taken while lying down, sitting, and then standing. Treatment may involve changing your diet, wearing compression stockings, sitting up slowly, adjusting your medicines, or correcting the underlying reason for the orthostatic hypotension. Get help right away if you have chest pain, a fast or irregular heartbeat, or symptoms of a  stroke. This information is not intended to replace advice given to you by your health care provider. Make sure you discuss any questions you have with your health care provider. Document Revised: 07/18/2020 Document Reviewed: 07/18/2020 Elsevier Patient Education  2023 Elsevier Inc.   Important Information About Sugar

## 2022-01-16 ENCOUNTER — Ambulatory Visit: Payer: PPO | Admitting: Physical Therapy

## 2022-01-21 ENCOUNTER — Other Ambulatory Visit: Payer: Self-pay | Admitting: Podiatry

## 2022-02-02 IMAGING — RF DG SWALLOWING FUNCTION
24 series · 24 of 24 positions shown · non-contrast
Comparison: None.

CLINICAL DATA: Dysphagia. Cough/GE reflux disease/other secondary
diagnosis

EXAM:
MODIFIED BARIUM SWALLOW
TECHNIQUE: Different consistencies of barium were administered orally to the
patient by the Speech Pathologist. Imaging of the pharynx was
performed in the lateral projection. The radiologist was present in
the fluoroscopy room for this study, providing personal supervision.
FLUOROSCOPY TIME:  Fluoroscopy Time:  2 minute 6 seconds
Radiation Exposure Index (if provided by the fluoroscopic device):
3.9 mGy
Number of Acquired Spot Images: 0

[Series 1: cp_standard · 0.34mm/px · 1 of 100 frames shown (1 of 24)]
[frame 16/100]
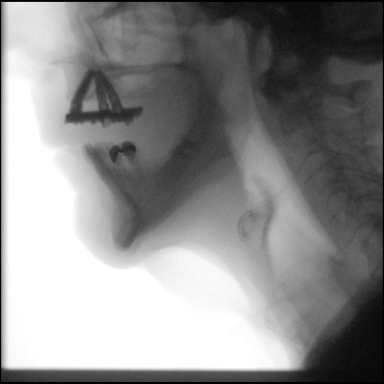

[Series 2: cp_standard · 0.34mm/px · 1 of 39 frames shown (2 of 24)]
[frame 2/39]
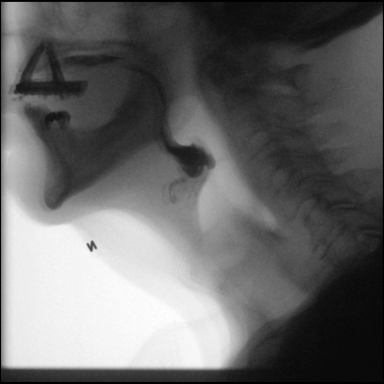

[Series 3: cp_standard · 0.34mm/px · 1 of 30 frames shown (3 of 24)]
[frame 5/30]
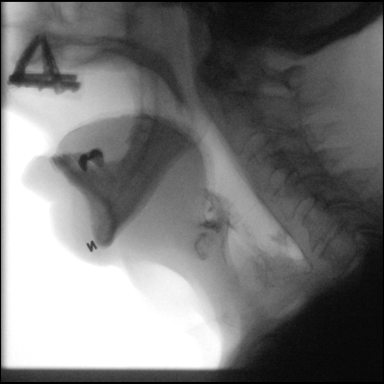

[Series 4: cp_standard · 0.34mm/px · 1 of 79 frames shown (4 of 24)]
[frame 12/79]
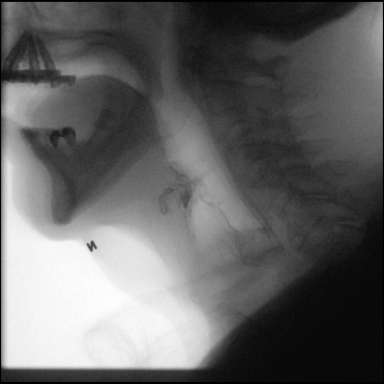

[Series 5: cp_standard · 0.34mm/px · 1 of 41 frames shown (5 of 24)]
[frame 7/41]
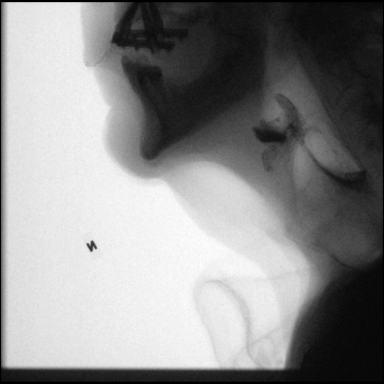

[Series 6: cp_standard · 0.34mm/px · 1 of 63 frames shown (6 of 24)]
[frame 1/63]
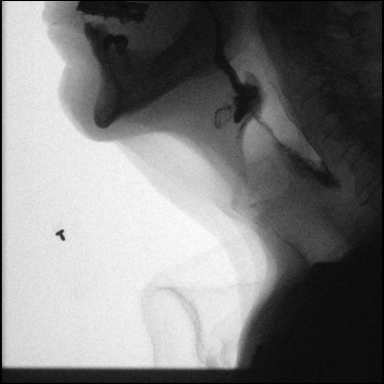

[Series 7: cp_standard · 0.34mm/px · 1 of 28 frames shown (7 of 24)]
[frame 15/28]
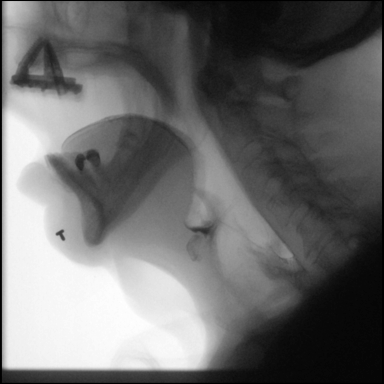

[Series 8: cp_standard · 0.34mm/px · 1 of 86 frames shown (8 of 24)]
[frame 44/86]
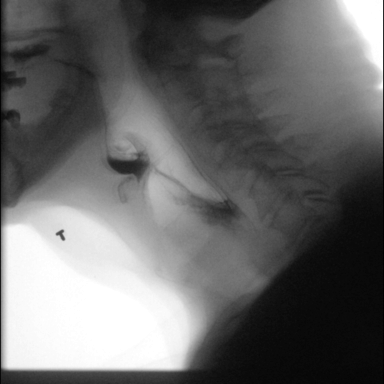

[Series 9: cp_standard · 0.34mm/px · 1 of 26 frames shown (9 of 24)]
[frame 19/26]
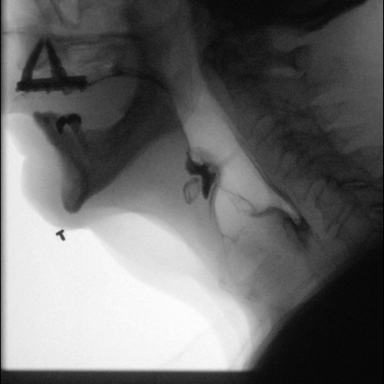

[Series 10: cp_standard · 0.34mm/px · 1 of 82 frames shown (10 of 24)]
[frame 52/82]
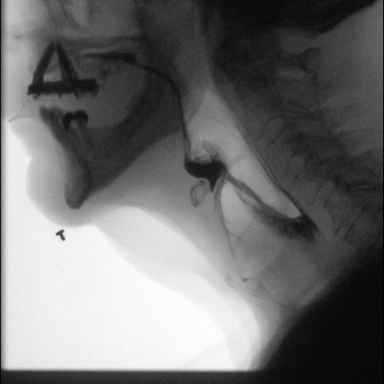

[Series 11: cp_standard · 0.34mm/px · 1 of 67 frames shown (11 of 24)]
[frame 34/67]
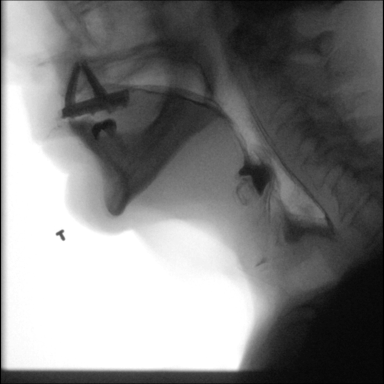

[Series 12: cp_standard · 0.34mm/px · 1 of 27 frames shown (12 of 24)]
[frame 23/27]
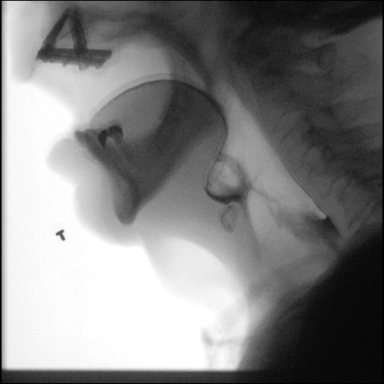

[Series 13: cp_standard · 0.34mm/px · 1 of 34 frames shown (13 of 24)]
[frame 29/34]
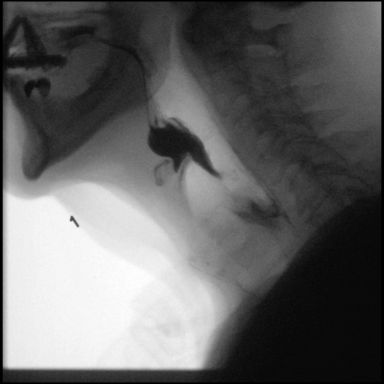

[Series 14: cp_standard · 0.34mm/px · 1 of 41 frames shown (14 of 24)]
[frame 32/41]
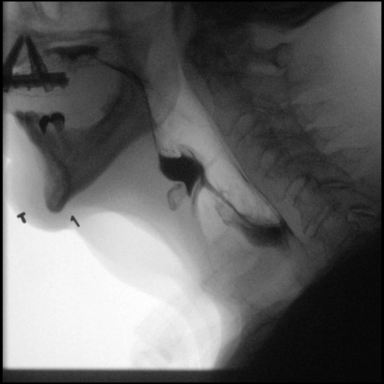

[Series 15: cp_standard · 0.34mm/px · 1 of 52 frames shown (15 of 24)]
[frame 45/52]
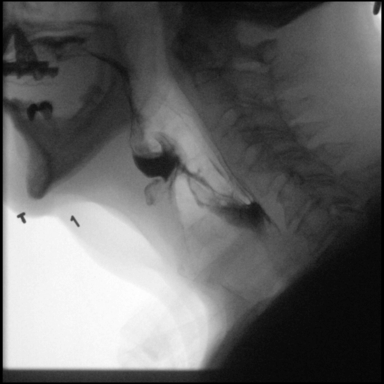

[Series 16: cp_standard · 0.34mm/px · 1 of 36 frames shown (16 of 24)]
[frame 19/36]
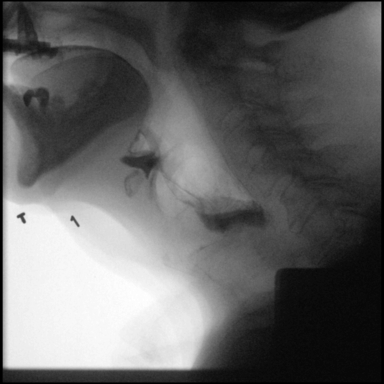

[Series 17: cp_standard · 0.34mm/px · 1 of 41 frames shown (17 of 24)]
[frame 21/41]
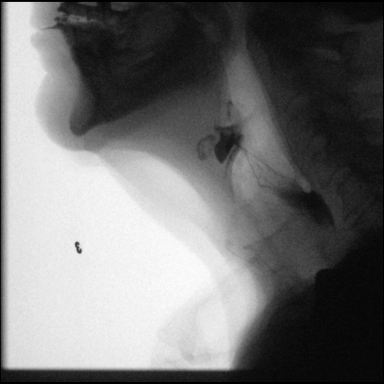

[Series 18: cp_standard · 0.34mm/px · 1 of 57 frames shown (18 of 24)]
[frame 44/57]
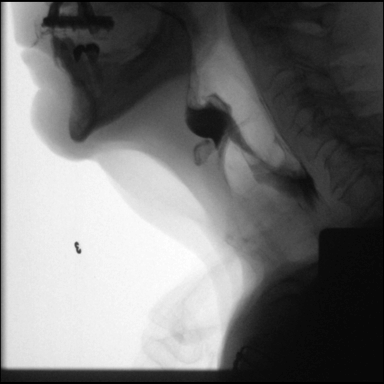

[Series 19: cp_standard · 0.34mm/px · 1 of 78 frames shown (19 of 24)]
[frame 67/78]
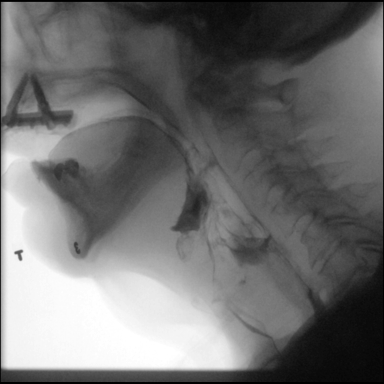

[Series 20: cp_standard · 0.34mm/px · 1 of 59 frames shown (20 of 24)]
[frame 51/59]
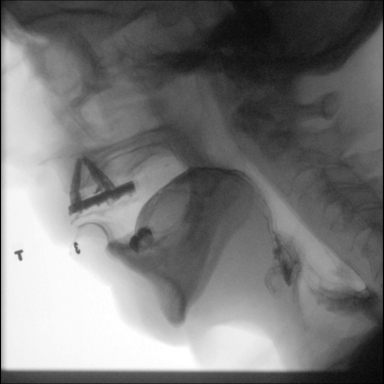

[Series 21: cp_standard · 0.34mm/px · 1 of 35 frames shown (21 of 24)]
[frame 30/35]
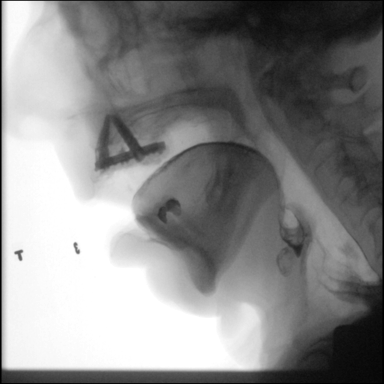

[Series 22: cp_standard · 0.34mm/px · 1 of 24 frames shown (22 of 24)]
[frame 21/24]
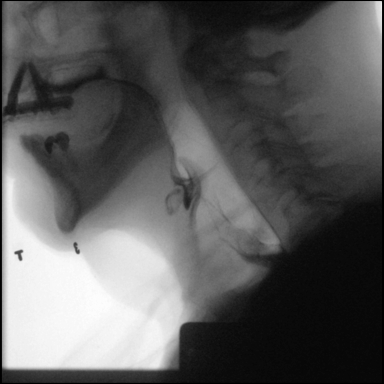

[Series 23: cp_standard · 0.34mm/px · 1 of 29 frames shown (23 of 24)]
[frame 25/29]
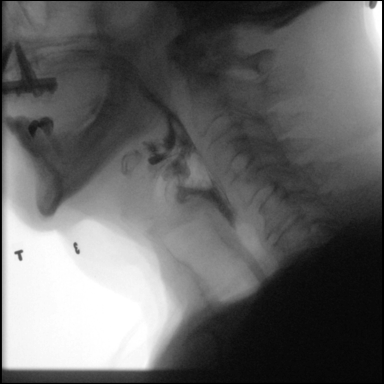

[Series 24: cp_standard · 0.34mm/px · 1 of 23 frames shown (24 of 24)]
[frame 23/23]
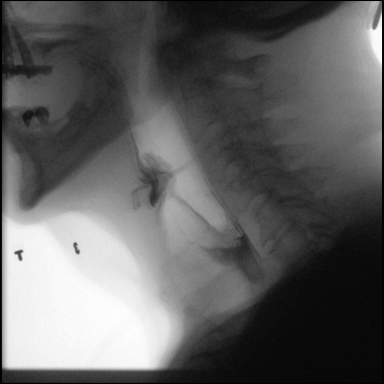

[24 of 24 positions shown; findings below may reference images not displayed]

FINDINGS: Laryngeal aspiration of thin and thick liquid. Significant retention
all food stuffs at the epiglottis.
IMPRESSION: Aspiration and retention.  See pathology report detail

Please refer to the Speech Pathologists report for complete details
and recommendations.

## 2022-02-10 ENCOUNTER — Encounter: Payer: Self-pay | Admitting: Cardiology

## 2022-02-10 NOTE — Telephone Encounter (Signed)
Per OV note on 01/14/22: He does come in with some blood pressure readings with systolic in the 68G.  We discussed increasing water intake to 64 ounces a day as well as incorporating some sodium into his diet.  We discussed also potentially needing medications to increase blood pressure but we want to try conservative measures first.

## 2022-02-23 ENCOUNTER — Ambulatory Visit: Payer: PPO | Attending: Internal Medicine

## 2022-02-23 DIAGNOSIS — Z950 Presence of cardiac pacemaker: Secondary | ICD-10-CM | POA: Diagnosis not present

## 2022-02-23 DIAGNOSIS — D509 Iron deficiency anemia, unspecified: Secondary | ICD-10-CM | POA: Diagnosis not present

## 2022-02-23 DIAGNOSIS — I25810 Atherosclerosis of coronary artery bypass graft(s) without angina pectoris: Secondary | ICD-10-CM | POA: Diagnosis not present

## 2022-02-23 DIAGNOSIS — Z5181 Encounter for therapeutic drug level monitoring: Secondary | ICD-10-CM | POA: Diagnosis not present

## 2022-02-23 DIAGNOSIS — Z952 Presence of prosthetic heart valve: Secondary | ICD-10-CM | POA: Diagnosis not present

## 2022-02-23 DIAGNOSIS — I4821 Permanent atrial fibrillation: Secondary | ICD-10-CM | POA: Diagnosis not present

## 2022-02-23 DIAGNOSIS — D6869 Other thrombophilia: Secondary | ICD-10-CM | POA: Diagnosis not present

## 2022-02-23 DIAGNOSIS — I119 Hypertensive heart disease without heart failure: Secondary | ICD-10-CM | POA: Diagnosis not present

## 2022-02-23 DIAGNOSIS — E7800 Pure hypercholesterolemia, unspecified: Secondary | ICD-10-CM | POA: Diagnosis not present

## 2022-02-23 DIAGNOSIS — E039 Hypothyroidism, unspecified: Secondary | ICD-10-CM | POA: Diagnosis not present

## 2022-02-23 DIAGNOSIS — H900 Conductive hearing loss, bilateral: Secondary | ICD-10-CM | POA: Diagnosis not present

## 2022-02-23 DIAGNOSIS — G20A1 Parkinson's disease without dyskinesia, without mention of fluctuations: Secondary | ICD-10-CM | POA: Diagnosis not present

## 2022-02-23 DIAGNOSIS — Z7901 Long term (current) use of anticoagulants: Secondary | ICD-10-CM | POA: Diagnosis not present

## 2022-02-23 LAB — POCT INR: INR: 4.3 — AB (ref 2.0–3.0)

## 2022-02-23 NOTE — Patient Instructions (Signed)
HOLD TODAY ONLY and then continue taking 1 tablet daily except 1/2 tablet on Sundays and Thursdays.  Recheck INR in 2 weeks.  Call [phone] call with any new medications or if scheduled for any other procedures.  Clearance/Procedure Fax [phone]

## 2022-03-10 ENCOUNTER — Ambulatory Visit: Payer: PPO | Attending: Internal Medicine

## 2022-03-10 DIAGNOSIS — Z5181 Encounter for therapeutic drug level monitoring: Secondary | ICD-10-CM | POA: Diagnosis not present

## 2022-03-10 LAB — POCT INR: INR: 2.6 (ref 2.0–3.0)

## 2022-03-10 NOTE — Patient Instructions (Signed)
continue taking 1 tablet daily except 1/2 tablet on Sundays and Thursdays.  Recheck INR in 3 weeks.  Call [phone] call with any new medications or if scheduled for any other procedures.  Clearance/Procedure Fax [phone]

## 2022-03-11 ENCOUNTER — Encounter: Payer: Self-pay | Admitting: Podiatry

## 2022-03-11 ENCOUNTER — Ambulatory Visit: Payer: PPO | Admitting: Podiatry

## 2022-03-11 DIAGNOSIS — M79676 Pain in unspecified toe(s): Secondary | ICD-10-CM | POA: Diagnosis not present

## 2022-03-11 DIAGNOSIS — B351 Tinea unguium: Secondary | ICD-10-CM | POA: Diagnosis not present

## 2022-03-11 DIAGNOSIS — H903 Sensorineural hearing loss, bilateral: Secondary | ICD-10-CM

## 2022-03-11 NOTE — Progress Notes (Signed)
This patient presents to the office with chief complaint of long thick painful nails.  Patient says the nails are painful walking and wearing shoes.  This patient is unable to self treat.  This patient is unable to trim his nails since she is unable to reach his nails.  he presents to the office for preventative foot care services.  General Appearance  Alert, conversant and in no acute stress.  Vascular  Dorsalis pedis and posterior tibial  pulses are palpable  bilaterally.  Capillary return is within normal limits  bilaterally. Temperature is within normal limits  bilaterally.  Neurologic  Senn-Weinstein monofilament wire test within normal limits  bilaterally. Muscle power within normal limits bilaterally.  Nails Thick disfigured discolored nails with subungual debris  from hallux to fifth toes bilaterally. No evidence of bacterial infection or drainage bilaterally.  Orthopedic  No limitations of motion  feet .  No crepitus or effusions noted.  No bony pathology or digital deformities noted.  Skin  normotropic skin with no porokeratosis noted bilaterally.  No signs of infections or ulcers noted.     Onychomycosis  Nails  B/L.  Pain in right toes  Pain in left toes  Debridement of nails both feet followed trimming the nails with dremel tool.    RTC 10 weeks    Andon Villard DPM   

## 2022-03-13 ENCOUNTER — Ambulatory Visit: Payer: PPO | Admitting: Neurology

## 2022-03-13 DIAGNOSIS — K117 Disturbances of salivary secretion: Secondary | ICD-10-CM

## 2022-03-13 MED ORDER — RIMABOTULINUMTOXINB 5000 UNIT/ML IM SOLN
5000.0000 [IU] | Freq: Once | INTRAMUSCULAR | Status: AC
  Administered 2022-03-13: 2500 [IU] via INTRAMUSCULAR

## 2022-03-13 NOTE — Procedures (Signed)
[facility]    History:  Diagnosis: Sialorrhea    Result History  Mouth a little too dry.  Wife requests decreased dosage  Consent obtained from: The patients wife The patient was educated on the botulinum toxin the black blox warning and given a copy of the botox patient medication guide.  The patient understands that this warning states that there have been reported cases of the Botox extending beyond the injection site and creating adverse effects, similar to those of botulism. This included loss of strength, trouble walking, hoarseness, trouble saying words clearly, loss of bladder control, trouble breathing, trouble swallowing, diplopia, blurry vision and ptosis. Most of the distant spread of Botox was happening in patients, primarily children, who received medication for spasticity or for cervical dystonia. The patient expressed understanding and desire to proceed.     Injections  Location Left  Right Units Number of sites  Submandibular gland 125 125 250 1 per side  Parotid 1125 1125 2250 1 per side  TOTAL UNITS:     2500      Type of Toxin: Myobloc type B As ordered and injected IM at today's visit Total Units: 2500 Discarded Units: 2500 Needle drawback with each injection was free of blood. Pt tolerated procedure well without complications.   Reinjection is anticipated in 3 months.

## 2022-03-20 ENCOUNTER — Ambulatory Visit (INDEPENDENT_AMBULATORY_CARE_PROVIDER_SITE_OTHER): Payer: PPO

## 2022-03-20 ENCOUNTER — Encounter: Payer: Self-pay | Admitting: Neurology

## 2022-03-20 DIAGNOSIS — I495 Sick sinus syndrome: Secondary | ICD-10-CM | POA: Diagnosis not present

## 2022-03-20 DIAGNOSIS — D509 Iron deficiency anemia, unspecified: Secondary | ICD-10-CM | POA: Diagnosis not present

## 2022-03-20 LAB — CUP PACEART REMOTE DEVICE CHECK
Battery Impedance: 2130 Ohm
Battery Remaining Longevity: 38 mo
Battery Voltage: 2.74 V
Brady Statistic RV Percent Paced: 58 %
Date Time Interrogation Session: 20231103095411
Implantable Lead Connection Status: 753985
Implantable Lead Connection Status: 753985
Implantable Lead Implant Date: 20060326
Implantable Lead Implant Date: 20060326
Implantable Lead Location: 753859
Implantable Lead Location: 753860
Implantable Lead Model: 5076
Implantable Lead Model: 5076
Implantable Pulse Generator Implant Date: 20120817
Lead Channel Impedance Value: 451 Ohm
Lead Channel Impedance Value: 67 Ohm
Lead Channel Pacing Threshold Amplitude: 0.75 V
Lead Channel Pacing Threshold Pulse Width: 0.4 ms
Lead Channel Setting Pacing Amplitude: 2.5 V
Lead Channel Setting Pacing Pulse Width: 0.4 ms
Lead Channel Setting Sensing Sensitivity: 5.6 mV
Zone Setting Status: 755011
Zone Setting Status: 755011

## 2022-03-22 ENCOUNTER — Other Ambulatory Visit: Payer: Self-pay | Admitting: Cardiovascular Disease

## 2022-03-22 DIAGNOSIS — I4821 Permanent atrial fibrillation: Secondary | ICD-10-CM

## 2022-03-27 ENCOUNTER — Other Ambulatory Visit: Payer: Self-pay | Admitting: Neurology

## 2022-03-27 DIAGNOSIS — G20A1 Parkinson's disease without dyskinesia, without mention of fluctuations: Secondary | ICD-10-CM

## 2022-03-30 NOTE — Progress Notes (Signed)
Remote pacemaker transmission.   

## 2022-03-30 NOTE — Telephone Encounter (Signed)
ER 50-200 MG  1. Which medications need refilled? (List name and dosage, if known) carbidopa levodopa ER 50-200 MG  2. Which pharmacy/location is medication to be sent to? (include street and city if local pharmacy) [facility]  3. Do they need a 30 day or 90 day supply? 90  They are getting ready to package patient's Rx's for delivery today and hope to include this medication as well.

## 2022-03-31 NOTE — Telephone Encounter (Signed)
Patient's wife called to follow up about this request.

## 2022-04-03 ENCOUNTER — Ambulatory Visit: Payer: PPO | Attending: Cardiology

## 2022-04-03 DIAGNOSIS — Z5181 Encounter for therapeutic drug level monitoring: Secondary | ICD-10-CM

## 2022-04-03 LAB — POCT INR: INR: 4.2 — AB (ref 2.0–3.0)

## 2022-04-03 NOTE — Patient Instructions (Signed)
Description   HOLD today's dose and eat greens and then continue taking 1 tablet daily except 1/2 tablet on Sundays and Thursdays.  Recheck INR in 2 weeks.  Call [phone] call with any new medications or if scheduled for any other procedures.  Clearance/Procedure Fax #[phone]

## 2022-04-17 ENCOUNTER — Telehealth: Payer: Self-pay | Admitting: Neurology

## 2022-04-17 ENCOUNTER — Ambulatory Visit: Payer: PPO | Attending: Cardiovascular Disease

## 2022-04-17 DIAGNOSIS — Z5181 Encounter for therapeutic drug level monitoring: Secondary | ICD-10-CM

## 2022-04-17 DIAGNOSIS — Z952 Presence of prosthetic heart valve: Secondary | ICD-10-CM

## 2022-04-17 LAB — POCT INR: INR: 3.4 — AB (ref 2.0–3.0)

## 2022-04-17 NOTE — Telephone Encounter (Signed)
-----   Message from Octaviano Batty Kassey Laforest, DO sent at 03/13/2022 10:33 AM EDT ----- Call wife and find out how he did with lower myobloc dosage for drooling

## 2022-04-17 NOTE — Patient Instructions (Signed)
Description   Continue taking 1 tablet daily except 1/2 tablet on Sundays and Thursdays.  Recheck INR in 3 weeks.  Call [phone] call with any new medications or if scheduled for any other procedures.  Clearance/Procedure Fax #[phone]

## 2022-04-17 NOTE — Telephone Encounter (Signed)
Called patients wife she feels he is doing somewhat better he is not so dry but now he is drooling some. Patients wife wanted to know if Dr. Arbutus Leas could do a 3/4 dose

## 2022-04-22 NOTE — Telephone Encounter (Signed)
Patients wife said she would like to do a full dose next time and thinks maybe patient just needed a break for his body to get adjusted

## 2022-05-04 ENCOUNTER — Telehealth: Payer: Self-pay | Admitting: Cardiology

## 2022-05-04 MED ORDER — NITROGLYCERIN 0.4 MG SL SUBL: SUBLINGUAL_TABLET | SUBLINGUAL | 8 refills | Status: DC

## 2022-05-04 NOTE — Telephone Encounter (Signed)
Pt's medication was sent to pt's pharmacy as requested. Confirmation received.  °

## 2022-05-04 NOTE — Telephone Encounter (Signed)
*  STAT* If patient is at the pharmacy, call can be transferred to refill team.   1. Which medications need to be refilled? (please list name of each medication and dose if known) nitroGLYCERIN (NITROSTAT) 0.4 MG SL tablet   2. Which pharmacy/location (including street and city if local pharmacy) is medication to be sent to?  [facility] - [place], [place] - [address]    3. Do they need a 30 day or 90 day supply?  30 day  

## 2022-05-06 NOTE — Progress Notes (Signed)
Assessment/Plan:   1.  Parkinsons Disease  -continue carbidopa/levodopa 25/100, 2 tablets @ 7am / 9:30 am/ 12 pm /2:30 pm /5 pm and 7:30 pm   -Continue carbidopa/levodopa 50/200 CR at bedtime  -continue Ropinirole, 2 mg 3 times per day.    -get to pcp re: hand swelling after fall  2.  Memory change  -May have PDD, but difficult to tell given the degree of hearing loss he has.  We have fully taken him off of entacapone.    3.  Dysphagia  -pt had mbe on 10/26 with mod oropharyngeal dysphagia.  Felt to be severe aspiration risk.    4.  Sialorrhea  -On Myobloc.  Wife requests that we increase his dose back to the full 5000.  5.  Low BP  -BP in the 80's today  -start midodrine.  Starting low dose, 2.5 mg tid.  Wife to call me and let me know how he is doing in few weeks.  6.  Mechanical heart valve  -On Coumadin.  Cannot get off of this because of the mechanical heart valve.  He has had multiple falls, so we really need to be careful. Subjective:   Donald [name] was seen today in follow up for Parkinsons disease.  My previous records were reviewed prior to todays visit as well as outside records available to me. Pt with wife who supplements the history.   We started the patient on Myobloc.  They initially felt that his mouth was too dry and they requested we decrease the dose.  We did that, but then his wife felt it was not quite enough.  They are requesting we increase the dose again.  Patient has fallen multiple times since our last visit.  His wife actually emailed cardiology in September because she felt that the falls were from low blood pressure.  His metoprolol had already been stopped.  He was told to increase his salt and water intake.  He is doing this.  They brought BP log and most this most were in the low 100's.  A few low 120's.  No near syncope.  He has had 5 falls just this month.  Many are going to the refrigerator and he is jerking to pull open the magnet.  He also falls  opening the pantry door open.  Falls occur with letting go of walker.  He has fallen at boxing when trying to back up.  No hallucinations.  Current prescribed movement disorder medications: carbidopa/levodopa 25/100, 2 tablets @ 7am / 9:30 am/ 12 pm /2:30 pm /5 pm and 7:30 pm Carbidopa/levodopa 50/200 CR at bedtime Ropinirole, 2mg  3 times per day (from 3 mg 3 times per day)  Prior medications: Carbidopa/levodopa 25/250; entacapone  ALLERGIES:   Allergies  Allergen Reactions   Sulfa Antibiotics     unknown    CURRENT MEDICATIONS:  Outpatient Encounter Medications as of 05/08/2022  Medication Sig   amoxicillin (AMOXIL) 500 MG capsule Take 4 capsules by mouth as directed. Prior to dental appts   aspirin 81 MG tablet Take 81 mg by mouth daily.   atorvastatin (LIPITOR) 20 MG tablet TAKE ONE TABLET BY MOUTH EVERYDAY AT BEDTIME   carbidopa-levodopa (SINEMET CR) 50-200 MG tablet TAKE ONE TABLET BY MOUTH EVERYDAY AT BEDTIME   carbidopa-levodopa (SINEMET IR) 25-100 MG tablet Take 2 tablets by mouth 6 (six) times daily.   esomeprazole (NEXIUM) 20 MG capsule Take 40 mg by mouth daily.   levothyroxine (SYNTHROID, LEVOTHROID) 125  MCG tablet Take 125 mcg by mouth daily.   nitroGLYCERIN (NITROSTAT) 0.4 MG SL tablet PLACE 1 TABLET UNDER TONGUE EVERY 5 MINUTES FOR 3 DOSES AS NEEDED FOR CHEST PAIN   rOPINIRole (REQUIP) 2 MG tablet 2 mg at 7am, 2 mg at noon, 2 mg at 5pm   warfarin (COUMADIN) 5 MG tablet TAKE 1/2 TO 1 TABLET BY MOUTH DAILY OR AS DIRECTED by coumadin clinic   No facility-administered encounter medications on file as of 05/08/2022.    Objective:   PHYSICAL EXAMINATION:    VITALS:   Vitals:   05/08/22 0903 05/08/22 0905 05/08/22 0906  SpO2: 96% 99% 99%  Weight: 142 lb 12.8 oz (64.8 kg)    Height: 5\' 8"  (1.727 m)     Orthostatic VS for the past 72 hrs (Last 3 readings):  Orthostatic BP Patient Position BP Location Orthostatic Pulse  05/08/22 0906 102/48 Standing Left Arm 62   05/08/22 0905 (!) 82/44 Sitting Left Arm 53  05/08/22 0903 90/40 Supine Left Leg 72       GEN:  The patient appears stated age and is in NAD. HEENT:  Normocephalic, atraumatic.  Pt in helmet.  The mucous membranes are moist. The superficial temporal arteries are without ropiness or tenderness. CV:  RRR Lungs:  CTAB Neck/HEME:  There are no carotid bruits bilaterally.  Neurological examination:  Orientation: The patient is alert and oriented x3. Cranial nerves: There is good facial symmetry with facial hypomimia. The speech is fluent and clear. Soft palate rises symmetrically and there is no tongue deviation. Hearing is markedly decreased to conversational tone. Sensation: Sensation is intact to light touch throughout Motor: Strength is at least antigravity x4. MS:  there is swelling and erythema and ecchymosis over the L index and middle fingers.  Movement examination: Tone: There is normal tone in the bilateral upper extremities.  The tone in the lower extremities is normal.  Abnormal movements: There is no tremor today.  No dyskinesia Coordination:  There is no decremation, with any form of RAMS, including alternating supination and pronation of the forearm, hand opening and closing, finger taps, heel taps and toe taps.  Gait and Station: The patient pushes off of the chair to arise.  He is given his walker.  He is short stepped and walks on toes and festinates  I have reviewed and interpreted the following labs independently    Chemistry      Component Value Date/Time   NA 139 07/01/2021 1150   K 4.5 07/01/2021 1150   CL 102 07/01/2021 1150   CO2 25 07/01/2021 1150   BUN 13 07/01/2021 1150   CREATININE 0.62 (L) 07/01/2021 1150      Component Value Date/Time   CALCIUM 9.3 07/01/2021 1150   ALKPHOS 174 (H) 07/01/2021 1150   AST 15 07/01/2021 1150   ALT 7 07/01/2021 1150   BILITOT 1.4 (H) 07/01/2021 1150       Lab Results  Component Value Date   WBC 4.8 07/01/2021    HGB 11.9 (L) 07/01/2021   HCT 36.3 (L) 07/01/2021   MCV 85 07/01/2021   PLT 235 07/01/2021    No results found for: "TSH"  No results found for: "VITAMINB12"    Total time spent on today's visit was 21 minutes, including both face-to-face time and nonface-to-face time.  Time included that spent on review of records (prior notes available to me/labs/imaging if pertinent), discussing treatment and goals, answering patient's questions and coordinating care.  Cc:  Tisovec, Adelfa Koh, MD

## 2022-05-08 ENCOUNTER — Ambulatory Visit: Payer: PPO | Admitting: Neurology

## 2022-05-08 ENCOUNTER — Ambulatory Visit: Payer: PPO | Attending: Internal Medicine | Admitting: *Deleted

## 2022-05-08 ENCOUNTER — Encounter: Payer: Self-pay | Admitting: Neurology

## 2022-05-08 VITALS — Ht 68.0 in | Wt 142.8 lb

## 2022-05-08 DIAGNOSIS — K117 Disturbances of salivary secretion: Secondary | ICD-10-CM | POA: Diagnosis not present

## 2022-05-08 DIAGNOSIS — G903 Multi-system degeneration of the autonomic nervous system: Secondary | ICD-10-CM | POA: Diagnosis not present

## 2022-05-08 DIAGNOSIS — Z5181 Encounter for therapeutic drug level monitoring: Secondary | ICD-10-CM | POA: Diagnosis not present

## 2022-05-08 DIAGNOSIS — R1319 Other dysphagia: Secondary | ICD-10-CM | POA: Diagnosis not present

## 2022-05-08 DIAGNOSIS — G20B2 Parkinson's disease with dyskinesia, with fluctuations: Secondary | ICD-10-CM | POA: Diagnosis not present

## 2022-05-08 LAB — POCT INR: INR: 4 — AB (ref 2.0–3.0)

## 2022-05-08 MED ORDER — MIDODRINE HCL 2.5 MG PO TABS: 2.5000 mg | ORAL_TABLET | Freq: Three times a day (TID) | ORAL | 4 refills | Status: DC

## 2022-05-08 NOTE — Patient Instructions (Signed)
Description   Do not take any warfarin today the start taking 1 tablet daily except 1/2 tablet on Sundays, Tuesdays, and Thursdays. Recheck INR in 3 weeks.  Call [phone] call with any new medications or if scheduled for any other procedures.  Clearance/Procedure Fax #[phone]

## 2022-05-08 NOTE — Patient Instructions (Signed)
Take midodrine 2.5 mg three times per day with meals  Local and Online Resources for Power over Parkinson's Group  December 2023    LOCAL [place] PARKINSON'S GROUPS   Power over Parkinson's Group:    Power Over Parkinson's Patient Education Group will be Wednesday, December 13th-*Hybrid meting*- in person at [facility] [facility] location and via WEBEX, 2:00-3:00 pm.   Starting in November, Power over Parkinson's and Care Partner Groups will meet together, with plans for separate break out session for caregivers (*this will be evolving over the next few months) Upcoming Power over Parkinson's Meetings/Care Partner Support:  2nd Wednesdays of the month at 2 pm:   December 13th, January 10th  Contact Amy Marriott at [email or web address] if interested in participating in this group    LOCAL EVENTS AND NEW OFFERINGS  Parkinson's Holiday Party!  Wednesday, December 6th, 4:00-5:00 pm.  [employer] [facility] [employer].  RSVP to Karen Simmers at [phone] or [email or web address] New PWR! Moves Community Fitness Instructor-Led Classes offering at [employer]!  TUESDAYS and Wednesdays 1-2 pm.   Contact Christy Weaver at  [email or web address]  or [phone] (Tuesday classes are modified for chair and standing only) Dance for Parkinson 's classes will be on Tuesdays 9:30am-10:30am starting October 3-December 12 with a break the week of November 21st. Located in the [facility], in the first floor of the [facility] ([address] To register:  [email or web address] or [phone]  Drumming for Parkinson's will be held on 2nd and 4th Mondays at 11:00 am.   Located at the [employer] ([address] [place]  Contact Jane Maydian at [email or web address] or [phone]  Through support from the Parkinson's Foundation, the Dance and Drumming for Parkinson's classes are free for both patients and  caregivers.    [employer] Parkinson's Tai Chi Class, Mondays at 11 am.  Call [phone] for details   ONLINE EDUCATION AND SUPPORT  Parkinson Foundation:  www.parkinson.org  PD Health at Home continues:  Mindfulness Mondays, Wellness Wednesdays, Fitness Fridays   Upcoming Education:    Eating and Feeling Well through the Holidays. Wednesday, Dec. 6th,  1-2 pm  Hospital Safety.  Wednesday, Dec. 13th, 1-2 pm Register for expert briefings (webinars) at https://www.[email or web address].org/resources-support/online-education/expert-briefings-webinars  Please check out their website to sign up for emails and see their full online offerings      [employer] Foundation:  www.michaeljfox.org   Third Thursday Webinars:  On the third Thursday of every month at 12 p.m. ET, join our free live webinars to learn about various aspects of living with Parkinson's disease and our work to speed medical breakthroughs.  Upcoming Webinar:  Tools for Diagnosing and Visualizing Parkinson's Disease.  Thursday, December 21st at 12 noon. Check out additional information on their website to see their full online offerings    [employer] Phinney Foundation:  www.davisphinneyfoundation.org  Upcoming Webinar:   Stay tuned  Webinar Series:  Living with Parkinson's Meetup.   Third Thursdays each month, 3 pm  Care Partner Monthly Meetup.  With Connie Carpenter Phinney.  First Tuesday of each month, 2 pm  Check out additional information to Live Well Today on their website    Parkinson and Movement Disorders (PMD) Alliance:  www.pmdalliance.org  [employer]:  Online Education Events  Sign up for emails, which are sent weekly to give you updates on programming and online offerings    Parkinson's Association of [place]:  www.parkinsonassociation.org  Information on online support groups, education events, and   online exercises including Yoga, Parkinson's exercises and more-LOTS of information on links to PD resources and  online events  Virtual Support Group through Parkinson's Association of [place]; next one is scheduled for Wednesday, December 6th  at 2 pm.  (These are typically scheduled for the 1st Wednesday of the month at 2 pm).  Visit website for details.   MOVEMENT AND EXERCISE OPPORTUNITIES  PWR! Moves Classes at [facility].  Wednesdays 10 and 11 am.   Contact Amy Marriott, PT [email or web address] if interested.  NEW PWR! Moves Class offerings at [employer].  *TUESDAYS* and Wednesdays 1-2 pm.    Contact Christy Weaver at  [email or web address]    Parkinson's Wellness Recovery (PWR! Moves)  www.pwr4life.org  Info on the PWR! Virtual Experience:  You will have access to our expertise?through self-assessment, guided plans that start with the PD-specific fundamentals, educational content, tips, Q&A with an expert, and a growing library of PD-specific pre-recorded and live exercise classes of varying types and intensity - both physical and cognitive! If that is not enough, we offer 1:1 wellness consultations (in-person or virtual) to personalize your PWR! Virtual Experience.   Parkinson Foundation Fitness Fridays:   As part of the PD Health @ Home program, this free video series focuses each week on one aspect of fitness designed to support people living with Parkinson's.? These weekly videos highlight the Parkinson Foundation fitness guidelines for people with Parkinson's disease.  www.parkinson.org/resources-support/online-education/pdhealth#ff   Dance for PD website is offering free, live-stream classes throughout the week, as well as links to digital library of classes:  [email or web address].[email or web address]/  Virtual dance and Pilates for Parkinson's classes: Click on the Community Tab> Parkinson's Movement Initiative Tab.  To register for classes and for more information, visit www.americandancefestival.org and click the "community" tab.   [employer] Parkinson's Cycling Classes    [facility]:  Thursdays @ Noon-Live classes at [facility] (Contact Margaret Hazen at [email or web address]?or [phone])  [facility]: Virtual Classes Mondays and Thursdays /Live classes Tuesday, Wednesday and Thursday (contact Marlee at [email or web address] ?or [phone])  [place] [employer]  Varied levels of classes are offered Tuesdays and Thursdays at [employer].   Stretching with Maria weekly class is also offered for people with Parkinson's  To observe a class or for more information, call [phone] or email Hillary Savage at [email or web address]   ADDITIONAL SUPPORT AND RESOURCES  [employer]:Online Caregiver Education Opportunities:  www.well-springsolutions.org/caregiver-education/caregiver-support-group.  You may also contact Jodi Kolada at [email or web address] or [phone].     [employer] Navigator:  Just1Navigator program, a?free service to help individuals and families through the journey of determining care for older adults.  The "Navigator" is a social worker, Nicole Reynolds, who will speak with a prospective client and/or loved ones to provide an assessment of the situation and a set of recommendations for a personalized care plan -- all free of charge, and whether?[employer] offers the needed service or not. If the need is not a service we provide, we are well-connected with reputable programs in town that we can refer you to.  www.well-springsolutions.org or to speak with the Navigator, call [phone].     

## 2022-05-21 ENCOUNTER — Other Ambulatory Visit: Payer: Self-pay | Admitting: Internal Medicine

## 2022-05-21 ENCOUNTER — Other Ambulatory Visit: Payer: Self-pay | Admitting: Neurology

## 2022-05-21 DIAGNOSIS — G20A1 Parkinson's disease without dyskinesia, without mention of fluctuations: Secondary | ICD-10-CM

## 2022-05-27 ENCOUNTER — Ambulatory Visit: Payer: PPO | Admitting: Podiatry

## 2022-05-27 ENCOUNTER — Encounter: Payer: Self-pay | Admitting: Podiatry

## 2022-05-27 DIAGNOSIS — M79676 Pain in unspecified toe(s): Secondary | ICD-10-CM | POA: Diagnosis not present

## 2022-05-27 DIAGNOSIS — B351 Tinea unguium: Secondary | ICD-10-CM

## 2022-05-27 NOTE — Progress Notes (Signed)
This patient presents to the office with chief complaint of long thick painful nails.  Patient says the nails are painful walking and wearing shoes.  This patient is unable to self treat.  This patient is unable to trim his nails since she is unable to reach his nails.  he presents to the office for preventative foot care services.  General Appearance  Alert, conversant and in no acute stress.  Vascular  Dorsalis pedis and posterior tibial  pulses are palpable  bilaterally.  Capillary return is within normal limits  bilaterally. Temperature is within normal limits  bilaterally.  Neurologic  Senn-Weinstein monofilament wire test within normal limits  bilaterally. Muscle power within normal limits bilaterally.  Nails Thick disfigured discolored nails with subungual debris  from hallux to fifth toes bilaterally. No evidence of bacterial infection or drainage bilaterally.  Orthopedic  No limitations of motion  feet .  No crepitus or effusions noted.  No bony pathology or digital deformities noted.  Skin  normotropic skin with no porokeratosis noted bilaterally.  No signs of infections or ulcers noted.     Onychomycosis  Nails  B/L.  Pain in right toes  Pain in left toes  Debridement of nails both feet followed trimming the nails with dremel tool.    RTC 10 weeks    Gardiner Barefoot DPM

## 2022-05-29 ENCOUNTER — Ambulatory Visit: Payer: PPO | Attending: Internal Medicine

## 2022-05-29 ENCOUNTER — Other Ambulatory Visit: Payer: Self-pay | Admitting: Neurology

## 2022-05-29 DIAGNOSIS — Z5181 Encounter for therapeutic drug level monitoring: Secondary | ICD-10-CM

## 2022-05-29 DIAGNOSIS — G20A1 Parkinson's disease without dyskinesia, without mention of fluctuations: Secondary | ICD-10-CM

## 2022-05-29 LAB — POCT INR: INR: 2.5 (ref 2.0–3.0)

## 2022-05-29 NOTE — Patient Instructions (Signed)
Continue 1 tablet daily except 1/2 tablet on Sundays, Tuesdays, and Thursdays. Recheck INR in 4 weeks.  Call [phone] call with any new medications or if scheduled for any other procedures.  Clearance/Procedure Fax [phone]

## 2022-06-05 ENCOUNTER — Other Ambulatory Visit: Payer: Self-pay

## 2022-06-05 ENCOUNTER — Telehealth: Payer: Self-pay | Admitting: Neurology

## 2022-06-05 MED ORDER — MIDODRINE HCL 2.5 MG PO TABS: ORAL_TABLET | ORAL | 0 refills | Status: DC

## 2022-06-05 NOTE — Telephone Encounter (Signed)
Pt's wife called in stating the pt has been falling. He has fallen 8 times in the last 7 days. She also wanted to give an update on his blood pressure readings since taking that new medication.

## 2022-06-05 NOTE — Telephone Encounter (Signed)
Called patients wife  Patient takes midodrine 3 times a day  Today 124/65 18th 106/65 9:00 AM and at 10:00 pm 126/89 17th 105/89 am 16th AM 86/56 and pm 132/66

## 2022-06-05 NOTE — Telephone Encounter (Signed)
Called patients wife and gave new instructions for midodrine medication. Sent prescription to [facility] and called them to let them know the new prescription to [facility] and called to verify the directions

## 2022-06-12 ENCOUNTER — Ambulatory Visit: Payer: PPO | Admitting: Neurology

## 2022-06-12 DIAGNOSIS — K117 Disturbances of salivary secretion: Secondary | ICD-10-CM | POA: Diagnosis not present

## 2022-06-12 DIAGNOSIS — G903 Multi-system degeneration of the autonomic nervous system: Secondary | ICD-10-CM

## 2022-06-12 MED ORDER — MIDODRINE HCL 5 MG PO TABS: ORAL_TABLET | ORAL | 5 refills | Status: DC

## 2022-06-12 MED ORDER — RIMABOTULINUMTOXINB 5000 UNIT/ML IM SOLN
5000.0000 [IU] | Freq: Once | INTRAMUSCULAR | Status: AC
  Administered 2022-06-12: 5000 [IU] via INTRAMUSCULAR

## 2022-06-12 NOTE — Procedures (Signed)
[facility]    History:  Diagnosis: Sialorrhea    Result History  We tried to decrease dose but too much drooling so are going back up  Consent obtained from: The patients wife The patient was educated on the botulinum toxin the black blox warning and given a copy of the botox patient medication guide.  The patient understands that this warning states that there have been reported cases of the Botox extending beyond the injection site and creating adverse effects, similar to those of botulism. This included loss of strength, trouble walking, hoarseness, trouble saying words clearly, loss of bladder control, trouble breathing, trouble swallowing, diplopia, blurry vision and ptosis. Most of the distant spread of Botox was happening in patients, primarily children, who received medication for spasticity or for cervical dystonia. The patient expressed understanding and desire to proceed.     Injections  Location Left  Right Units Number of sites  Submandibular gland 250 250 500 1 per side  Parotid 2250 2250 4500 1 per side  TOTAL UNITS:     5000      Type of Toxin: Myobloc type B As ordered and injected IM at today's visit Total Units: 2500 Discarded Units: 2500 Needle drawback with each injection was free of blood. Pt tolerated procedure well without complications.   Reinjection is anticipated in 3 months.

## 2022-06-12 NOTE — Progress Notes (Signed)
Assessment/Plan:   1.  Parkinsons Disease  -continue carbidopa/levodopa 25/100, 2 tablets @ 7am / 9:30 am/ 12 pm /2:30 pm /5 pm and 7:30 pm   -Continue carbidopa/levodopa 50/200 CR at bedtime  -continue Ropinirole, 2 mg 3 times per day.     2.  Memory change  -May have PDD, but difficult to tell given the degree of hearing loss he has.  We have fully taken him off of entacapone.    3.  Dysphagia  -pt had mbe on 10/26 with mod oropharyngeal dysphagia.  Felt to be severe aspiration risk.    4.  Sialorrhea  -On Myobloc.  This was completed today with the higher dose of 5000 units.  5.  Low BP  -Increase midodrine so that he takes 10 mg in the morning, 5 mg with lunch and 5 mg with dinner.  His wife will continue to monitor his blood pressure.  6.  Mechanical heart valve  -On Coumadin.  Cannot get off of this because of the mechanical heart valve.  He has had multiple falls, so we really need to be careful. Subjective:   Donald [name] was originally in the office for Botox procedure.  However, they brought in an extensive blood pressure log and asked me to address this.  Patient had several blood pressures that were in the 90s and even in the 30Q systolic, but sometimes in the low 100s in the morning and then he had subsequent falls.  They have been consistently tracking the blood pressures and they have been running usually in the low 657Q systolic, but he has had some that were in the 130s to a maximum of 150s.  He has never had a fall when blood pressure was not low.  Overall, patient states that he has been doing a bit better because blood pressure has been higher.  Current prescribed movement disorder medications: carbidopa/levodopa 25/100, 2 tablets @ 7am / 9:30 am/ 12 pm /2:30 pm /5 pm and 7:30 pm Carbidopa/levodopa 50/200 CR at bedtime Ropinirole, 2mg  3 times per day (from 3 mg 3 times per day)  Prior medications: Carbidopa/levodopa 25/250; entacapone  ALLERGIES:    Allergies  Allergen Reactions   Sulfa Antibiotics     unknown    CURRENT MEDICATIONS:  Outpatient Encounter Medications as of 06/12/2022  Medication Sig   midodrine (PROAMATINE) 5 MG tablet 2 with breakfast, 1 with lunch and dinner   amoxicillin (AMOXIL) 500 MG capsule Take 4 capsules by mouth as directed. Prior to dental appts   aspirin 81 MG tablet Take 81 mg by mouth daily.   atorvastatin (LIPITOR) 20 MG tablet TAKE ONE TABLET BY MOUTH EVERYDAY AT BEDTIME   carbidopa-levodopa (SINEMET CR) 50-200 MG tablet TAKE ONE TABLET BY MOUTH EVERYDAY AT BEDTIME   carbidopa-levodopa (SINEMET IR) 25-100 MG tablet take 2 tablets by mouth six times a day at 7am / 9:30 am/ 12 pm /2:30 pm /5 pm and 7:30 pm   esomeprazole (NEXIUM) 20 MG capsule Take 40 mg by mouth daily.   levothyroxine (SYNTHROID, LEVOTHROID) 125 MCG tablet Take 125 mcg by mouth daily.   nitroGLYCERIN (NITROSTAT) 0.4 MG SL tablet PLACE 1 TABLET UNDER TONGUE EVERY 5 MINUTES FOR 3 DOSES AS NEEDED FOR CHEST PAIN   rOPINIRole (REQUIP) 2 MG tablet TAKE ONE TABLET BY MOUTH BEFORE BREAKFAST AND TAKE ONE TABLET BY MOUTH AT NOON AND TAKE ONE TABLET BY MOUTH EVERY EVENING   warfarin (COUMADIN) 5 MG tablet TAKE 1/2 TO  1 TABLET BY MOUTH DAILY OR AS DIRECTED by coumadin clinic   [DISCONTINUED] midodrine (PROAMATINE) 2.5 MG tablet Take 2 tablets (5 mg) at Breakfast and Take 2 tablets (5 mg) at lunch and Take 1  tablet (2.5 mg) at dinner   [EXPIRED] Botulinum Toxin Type B SOLN 5,000 Units    No facility-administered encounter medications on file as of 06/12/2022.    Objective:   PHYSICAL EXAMINATION:    VITALS:   There were no vitals filed for this visit.  No data found.      GEN:  The patient appears stated age and is in NAD. HEENT:  Normocephalic, atraumatic.  Pt in helmet.  The mucous membranes are moist. The superficial temporal arteries are without ropiness or tenderness. CV:  RRR Lungs:  CTAB Neck/HEME:  There are no carotid  bruits bilaterally.  Neurological examination:  Orientation: The patient is alert and oriented x3. Cranial nerves: There is good facial symmetry with facial hypomimia. The speech is fluent and clear. Soft palate rises symmetrically and there is no tongue deviation. Hearing is markedly decreased to conversational tone. Sensation: Sensation is intact to light touch throughout Motor: Strength is at least antigravity x4. MS:  there is swelling and erythema and ecchymosis over the L index and middle fingers.  Movement examination: Tone: There is normal tone in the bilateral upper extremities.  The tone in the lower extremities is normal.  Abnormal movements: There is no tremor today.  No dyskinesia Coordination:  There is no decremation, with any form of RAMS, including alternating supination and pronation of the forearm, hand opening and closing, finger taps, heel taps and toe taps.  Gait and Station: The patient pushes off of the chair to arise.  He is given his walker.  He is short stepped and walks on toes and festinates  I have reviewed and interpreted the following labs independently    Chemistry      Component Value Date/Time   NA 139 07/01/2021 1150   K 4.5 07/01/2021 1150   CL 102 07/01/2021 1150   CO2 25 07/01/2021 1150   BUN 13 07/01/2021 1150   CREATININE 0.62 (L) 07/01/2021 1150      Component Value Date/Time   CALCIUM 9.3 07/01/2021 1150   ALKPHOS 174 (H) 07/01/2021 1150   AST 15 07/01/2021 1150   ALT 7 07/01/2021 1150   BILITOT 1.4 (H) 07/01/2021 1150       Lab Results  Component Value Date   WBC 4.8 07/01/2021   HGB 11.9 (L) 07/01/2021   HCT 36.3 (L) 07/01/2021   MCV 85 07/01/2021   PLT 235 07/01/2021    No results found for: "TSH"  No results found for: "VITAMINB12"     Cc:  Tisovec, Fransico Him, MD

## 2022-06-12 NOTE — Patient Instructions (Signed)
Stop midodrine 2.5 mg START midodrine 5 mg and take two with breakfast, one with lunch and one with dinner.  Continue to monitor blood pressure

## 2022-06-19 ENCOUNTER — Ambulatory Visit: Payer: PPO

## 2022-06-19 DIAGNOSIS — I495 Sick sinus syndrome: Secondary | ICD-10-CM | POA: Diagnosis not present

## 2022-06-19 LAB — CUP PACEART REMOTE DEVICE CHECK
Battery Impedance: 2286 Ohm
Battery Remaining Longevity: 35 mo
Battery Voltage: 2.74 V
Brady Statistic RV Percent Paced: 56 %
Date Time Interrogation Session: 20240202090414
Implantable Lead Connection Status: 753985
Implantable Lead Connection Status: 753985
Implantable Lead Implant Date: 20060326
Implantable Lead Implant Date: 20060326
Implantable Lead Location: 753859
Implantable Lead Location: 753860
Implantable Lead Model: 5076
Implantable Lead Model: 5076
Implantable Pulse Generator Implant Date: 20120817
Lead Channel Impedance Value: 429 Ohm
Lead Channel Impedance Value: 67 Ohm
Lead Channel Pacing Threshold Amplitude: 0.75 V
Lead Channel Pacing Threshold Pulse Width: 0.4 ms
Lead Channel Setting Pacing Amplitude: 2.5 V
Lead Channel Setting Pacing Pulse Width: 0.4 ms
Lead Channel Setting Sensing Sensitivity: 5.6 mV
Zone Setting Status: 755011
Zone Setting Status: 755011

## 2022-06-26 ENCOUNTER — Ambulatory Visit: Payer: PPO | Attending: Internal Medicine

## 2022-06-26 DIAGNOSIS — Z5181 Encounter for therapeutic drug level monitoring: Secondary | ICD-10-CM | POA: Diagnosis not present

## 2022-06-26 LAB — POCT INR: INR: 2.6 (ref 2.0–3.0)

## 2022-06-26 NOTE — Patient Instructions (Signed)
Description   Continue 1 tablet daily except 1/2 tablet on Sundays, Tuesdays, and Thursdays. Recheck INR in 5 weeks.  Call [phone] call with any new medications or if scheduled for any other procedures.  Clearance/Procedure Fax [phone]

## 2022-07-07 NOTE — Progress Notes (Signed)
Remote pacemaker transmission.   

## 2022-07-15 NOTE — Progress Notes (Signed)
Pt seen in joint visit with Dr. Marlou Porch for device check.   Pacemaker check in clinic. Normal device function. Thresholds, sensing, impedances consistent with previous measurements. Device programmed to maximize longevity. Permanent AF on coumadin. Rates overalls well controlled. Rare, short HVR episodes < 10 seconds. Cannot r/o NSVT. Device programmed at appropriate safety margins. Histogram distribution appropriate for patient activity level. Device programmed to optimize intrinsic conduction. Estimated longevity 2 yr, 10 mo. Patient enrolled in remote follow-up. Patient education completed.  F/u in office 12 months for continued device management.   Legrand Como [address] [place], PA-C  07/20/2022 11:22 AM

## 2022-07-20 ENCOUNTER — Ambulatory Visit: Payer: PPO | Attending: Cardiology | Admitting: Cardiology

## 2022-07-20 ENCOUNTER — Ambulatory Visit (INDEPENDENT_AMBULATORY_CARE_PROVIDER_SITE_OTHER): Payer: PPO | Admitting: Student

## 2022-07-20 ENCOUNTER — Encounter: Payer: Self-pay | Admitting: Cardiology

## 2022-07-20 VITALS — BP 129/67 | HR 70 | Ht 68.0 in | Wt 144.6 lb

## 2022-07-20 DIAGNOSIS — I350 Nonrheumatic aortic (valve) stenosis: Secondary | ICD-10-CM | POA: Diagnosis not present

## 2022-07-20 DIAGNOSIS — I951 Orthostatic hypotension: Secondary | ICD-10-CM

## 2022-07-20 DIAGNOSIS — I495 Sick sinus syndrome: Secondary | ICD-10-CM

## 2022-07-20 DIAGNOSIS — I10 Essential (primary) hypertension: Secondary | ICD-10-CM

## 2022-07-20 DIAGNOSIS — Z952 Presence of prosthetic heart valve: Secondary | ICD-10-CM

## 2022-07-20 DIAGNOSIS — I4821 Permanent atrial fibrillation: Secondary | ICD-10-CM

## 2022-07-20 DIAGNOSIS — Z950 Presence of cardiac pacemaker: Secondary | ICD-10-CM

## 2022-07-20 LAB — CUP PACEART INCLINIC DEVICE CHECK
Battery Impedance: 2347 Ohm
Battery Remaining Longevity: 34 mo
Battery Voltage: 2.74 V
Brady Statistic RV Percent Paced: 56 %
Date Time Interrogation Session: 20240304111914
Implantable Lead Connection Status: 753985
Implantable Lead Connection Status: 753985
Implantable Lead Implant Date: 20060326
Implantable Lead Implant Date: 20060326
Implantable Lead Location: 753859
Implantable Lead Location: 753860
Implantable Lead Model: 5076
Implantable Lead Model: 5076
Implantable Pulse Generator Implant Date: 20120817
Lead Channel Impedance Value: 432 Ohm
Lead Channel Impedance Value: 67 Ohm
Lead Channel Pacing Threshold Amplitude: 0.75 V
Lead Channel Pacing Threshold Amplitude: 0.75 V
Lead Channel Pacing Threshold Pulse Width: 0.4 ms
Lead Channel Pacing Threshold Pulse Width: 0.4 ms
Lead Channel Sensing Intrinsic Amplitude: 15.67 mV
Lead Channel Setting Pacing Amplitude: 2.5 V
Lead Channel Setting Pacing Pulse Width: 0.4 ms
Lead Channel Setting Sensing Sensitivity: 5.6 mV
Zone Setting Status: 755011
Zone Setting Status: 755011

## 2022-07-20 NOTE — Progress Notes (Signed)
Cardiology Office Note:    Date:  07/20/2022   ID:  Donald [name], DOB [birth year], MRN [id number]  PCP:  Haywood Pao, MD  Cardiologist:  Candee Furbish, MD    Referring MD: Biagio Borg, MD     History of Present Illness:    Donald [name] is a 82 y.o. male with a hx of hypertension, hyperlipidemia, Afib on coumadin, and aortic stenosis, and s/p aortic valve replacement (2006) here today for management of aortic valve replacement.  He saw Barrington Ellison 07/01/21 and was doing well. Most recent echo showed LVEF 50 to 55%, aortic dilation to 39 mm, and mean gradients through mechanical AV are 35 and 23 mmHg. He was referred to general cardiology.  No chest pain no fevers no chills.  No syncope.  No shortness of breath.  Today, he is accompanied by his wife. pacemaker has 3 more years. He is compliant and tolerating his medications.    Past Medical History:  Diagnosis Date   Abnormality of gait 08/27/2014   Anxiety    Aortic stenosis    s/p AVR by Dr Cyndia Bent   Arthritis    left wrist- probable- not diagonised   Asthma    in the past   CAD (coronary artery disease)    s/p CABG 2006   GERD (gastroesophageal reflux disease)    H/O seasonal allergies    H/O: rheumatic fever    Hearing deficit    Bilateral hearing aids   Hematuria    had CT scan   Hiatal hernia    History of kidney stones    HOH (hard of hearing)    Hyperlipidemia    Hypertension    Hyperthyroidism    Inguinal hernia    right side; watching at this time per wife's report   Memory difficulty 08/27/2014   Pacemaker    Parkinson's disease    Persistent atrial fibrillation ([place])    Scarlet fever    as a child   Shortness of breath    Sick sinus syndrome ([place])    s/p PPM (MDT) 2006    Past Surgical History:  Procedure Laterality Date   AORTIC VALVE REPLACEMENT  2006   CARDIOVERSION N/A 09/14/2013   Procedure: CARDIOVERSION;  Surgeon: Sanda Klein, MD;  Location: [facility];  Service:  Cardiovascular;  Laterality: N/A;   CHOLECYSTECTOMY N/A 07/21/2013   Procedure: LAPAROSCOPIC CHOLECYSTECTOMY;  Surgeon: Edward Jolly, MD;  Location: [facility];  Service: General;  Laterality: N/A;   COLONOSCOPY W/ POLYPECTOMY     COLONOSCOPY WITH PROPOFOL N/A 08/31/2016   Procedure: COLONOSCOPY WITH PROPOFOL;  Surgeon: Wilford Corner, MD;  Location: [facility] END[facility];  Service: Endoscopy;  Laterality: N/A;   CORONARY ARTERY BYPASS GRAFT  2006   DENTAL SURGERY     implants   INSERT / REPLACE / REMOVE PACEMAKER  2006, 2012   MDT for sick sinus syndrome, gen change 8/12 by [place]    Current Medications: Current Meds  Medication Sig   amoxicillin (AMOXIL) 500 MG capsule Take 4 capsules by mouth as directed. Prior to dental appts   aspirin 81 MG tablet Take 81 mg by mouth daily.   atorvastatin (LIPITOR) 20 MG tablet TAKE ONE TABLET BY MOUTH EVERYDAY AT BEDTIME   carbidopa-levodopa (SINEMET CR) 50-200 MG tablet TAKE ONE TABLET BY MOUTH EVERYDAY AT BEDTIME   carbidopa-levodopa (SINEMET IR) 25-100 MG tablet take 2 tablets by mouth six times a day at  7am / 9:30 am/ 12 pm /2:30 pm /5 pm and 7:30 pm   esomeprazole (NEXIUM) 20 MG capsule Take 40 mg by mouth daily.   levothyroxine (SYNTHROID, LEVOTHROID) 125 MCG tablet Take 125 mcg by mouth daily.   midodrine (PROAMATINE) 5 MG tablet 2 with breakfast, 1 with lunch and dinner   nitroGLYCERIN (NITROSTAT) 0.4 MG SL tablet PLACE 1 TABLET UNDER TONGUE EVERY 5 MINUTES FOR 3 DOSES AS NEEDED FOR CHEST PAIN   rOPINIRole (REQUIP) 2 MG tablet TAKE ONE TABLET BY MOUTH BEFORE BREAKFAST AND TAKE ONE TABLET BY MOUTH AT NOON AND TAKE ONE TABLET BY MOUTH EVERY EVENING   warfarin (COUMADIN) 5 MG tablet TAKE 1/2 TO 1 TABLET BY MOUTH DAILY OR AS DIRECTED by coumadin clinic     Allergies:   Sulfa antibiotics   Social History   Socioeconomic History   Marital status: Married    Spouse name: barbars   Number of children: 2   Years of  education: HS   Highest education level: Not on file  Occupational History   Occupation: N/A  Tobacco Use   Smoking status: Never    Passive exposure: Past   Smokeless tobacco: Never  Vaping Use   Vaping Use: Never used  Substance and Sexual Activity   Alcohol use: No   Drug use: No   Sexual activity: Yes  Other Topics Concern   Not on file  Social History Narrative   Lives at home w/ his wife   Patient is left handed.   Patient does not drink caffeine.   Social Determinants of Health   Financial Resource Strain: Not on file  Food Insecurity: Not on file  Transportation Needs: Not on file  Physical Activity: Not on file  Stress: Not on file  Social Connections: Not on file     Family History: The patient's family history includes Coronary artery disease in an other family member; Dementia in his mother; Heart disease in his brother, father, and sister; Parkinsonism in his mother; Tremor in his brother.  ROS:   Please see the history of present illness.    (+) Falls, yes, many.  (+) Numbness (Tips of bilateral fingers) All other systems reviewed and negative.   EKGs/Labs/Other Studies Reviewed:    The following studies were reviewed today: Echo 07/21/21 1. Septal motion consistent with conduction delay. Left ventricular ejection fraction, by estimation, is 50 to 55%. The left ventricle has low normal function. There is mild left ventricular hypertrophy. Left ventricular diastolic parameters are indeterminate.   2. Right ventricular systolic function is normal. The right ventricular size is mildly enlarged. There is moderately elevated pulmonary artery systolic pressure.   3. Left atrial size was severely dilated.   4. Right atrial size was severely dilated.   5. Mild mitral valve regurgitation.   6. S/P AVR (2006) with mechanical AVR. Unable to find any further information. Peak and mean gradients through the valve are 35 and 23 mm Hg. Dimensionless index is 0.3  consistent with moderate AS.   7. Aortic dilatation noted. There is mild dilatation of the ascending aorta, measuring 39 mm.   8. The inferior vena cava is dilated in size with <50% respiratory variability, suggesting right atrial pressure of 15 mmHg.  Personally reviewed and interpreted.  EKG:  07/20/22: AFIB 66, paced  Recent Labs: No results found for requested labs within last 365 days.   Recent Lipid Panel No results found for: "CHOL", "TRIG", "HDL", "CHOLHDL", "VLDL", "LDLCALC", "LDLDIRECT"  CHA2DS2-VASc Score = 4 [CHF History: 0, HTN History: 1, Diabetes History: 0, Stroke History: 0, Vascular Disease History: 1, Age Score: 2, Gender Score: 0].  Therefore, the patient's annual risk of stroke is 4.8 %.        Physical Exam:    VS:  BP 129/67   Pulse 70   Ht '5\' 8"'$  (1.727 m)   Wt 144 lb 9.6 oz (65.6 kg)   SpO2 98%   BMI 21.99 kg/m     Wt Readings from Last 3 Encounters:  07/20/22 144 lb 9.6 oz (65.6 kg)  05/08/22 142 lb 12.8 oz (64.8 kg)  01/14/22 142 lb (64.4 kg)     GEN: Thin, uses walker, short shuffling gait, helmet.  HEENT: Normal NECK: No JVD; No carotid bruits LYMPHATICS: No lymphadenopathy CARDIAC: RRR, sharp S2 click, 2/6 systolic murmur, no rubs or gallops.No changes.  RESPIRATORY:  Clear to auscultation without rales, wheezing or rhonchi  ABDOMEN: Soft, non-tender, non-distended MUSCULOSKELETAL:  No edema; No deformity  SKIN: Warm and dry NEUROLOGIC:  Alert, did not say much.  PSYCHIATRIC:  Normal affect   ASSESSMENT:    1. Aortic valve stenosis, etiology of cardiac valve disease unspecified   2. Mechanical aortic valve   3. Orthostatic hypotension     PLAN:     Coronary artery disease involving native coronary artery of native heart without angina pectoris CABG 2006.  No anginal symptoms.  On aspirin 81 mg because of mechanical aortic valve.  Also on Coumadin.  Taking metoprolol, atorvastatin.  Excellent.  No changes made.  Dental  prophylaxis.  Orthostatic hypotension No recent episodes.  Be careful when standing up.  Likely autonomic as it relates to Parkinson's.  He does wear a helmet just in case he falls with his Coumadin.  He does fall quite often.  He wears elbow pads as well as a helmet.  Parkinson disease Currently on Sinemet.  Neurology.  Also having some tingling in his fingertips.  Likely neuropathy. Can fall asleep quite easily.  Likely medication related    Pacemaker-[employer] Followed by EP.  Today seen by [facility].  Excellent.  Mechanical aortic valve On Coumadin.  INR reviewed.  Closely monitoring.  Also has dental prophylaxis.  Excellent.  On aspirin 81 mg as well.  His gradients were little higher than expected for mechanical valve, 23 mm mean.  We will continue to monitor.  We will check an echocardiogram again.  Continue with Coumadin.  Sharp S2 click heard on exam.  Asymptomatic.  Permanent atrial fibrillation (HCC) Currently well rate controlled.  On metoprolol.  No changes made.  Pacemaker in place to prevent bradycardia. Good rate cont. 3 yrs of battery life. 50% V paced.   Recent lab work has been performed by his primary care physician.  Prior office notes reviewed.    Medication Adjustments/Labs and Tests Ordered: Current medicines are reviewed at length with the patient today.  Concerns regarding medicines are outlined above.  Orders Placed This Encounter  Procedures   EKG 12-Lead   ECHOCARDIOGRAM COMPLETE   No orders of the defined types were placed in this encounter.   Signed, Candee Furbish, MD  07/20/2022 10:58 AM    [facility]

## 2022-07-20 NOTE — Patient Instructions (Signed)
Medication Instructions:  The current medical regimen is effective;  continue present plan and medications.  *If you need a refill on your cardiac medications before your next appointment, please call your pharmacy*  Testing/Procedures: Your physician has requested that you have an echocardiogram. Echocardiography is a painless test that uses sound waves to create images of your heart. It provides your doctor with information about the size and shape of your heart and how well your heart's chambers and valves are working. This procedure takes approximately one hour. There are no restrictions for this procedure. Please do NOT wear cologne, perfume, aftershave, or lotions (deodorant is allowed). Please arrive 15 minutes prior to your appointment time.   Follow-Up: At [facility], you and your health needs are our priority.  As part of our continuing mission to provide you with exceptional heart care, we have created designated Provider Care Teams.  These Care Teams include your primary Cardiologist (physician) and Advanced Practice Providers (APPs -  Physician Assistants and Nurse Practitioners) who all work together to provide you with the care you need, when you need it.  We recommend signing up for the patient portal called "MyChart".  Sign up information is provided on this After Visit Summary.  MyChart is used to connect with patients for Virtual Visits (Telemedicine).  Patients are able to view lab/test results, encounter notes, upcoming appointments, etc.  Non-urgent messages can be sent to your provider as well.   To learn more about what you can do with MyChart, go to [email or web address].    Your next appointment:   1 year(s)  Provider:   Candee Furbish, MD

## 2022-07-31 ENCOUNTER — Ambulatory Visit: Payer: PPO

## 2022-07-31 ENCOUNTER — Ambulatory Visit: Payer: PPO | Attending: Internal Medicine | Admitting: *Deleted

## 2022-07-31 DIAGNOSIS — Z5181 Encounter for therapeutic drug level monitoring: Secondary | ICD-10-CM | POA: Diagnosis not present

## 2022-07-31 LAB — POCT INR: INR: 2.5 (ref 2.0–3.0)

## 2022-07-31 NOTE — Patient Instructions (Signed)
Description   Today take 1.5 tablets of warfarin then continue taking 1 tablet daily except 1/2 tablet on Sundays, Tuesdays, and Thursdays. Recheck INR in 5 weeks.  Call [phone] call with any new medications or if scheduled for any other procedures.  Clearance/Procedure Fax [phone]

## 2022-08-10 ENCOUNTER — Ambulatory Visit (INDEPENDENT_AMBULATORY_CARE_PROVIDER_SITE_OTHER): Payer: PPO

## 2022-08-10 DIAGNOSIS — I350 Nonrheumatic aortic (valve) stenosis: Secondary | ICD-10-CM

## 2022-08-10 DIAGNOSIS — Z952 Presence of prosthetic heart valve: Secondary | ICD-10-CM | POA: Diagnosis not present

## 2022-08-10 LAB — ECHOCARDIOGRAM COMPLETE
AV Mean grad: 18 mmHg
AV Peak grad: 34.6 mmHg
Ao pk vel: 2.94 m/s
Area-P 1/2: 3.48 cm2
MV M vel: 5.17 m/s
MV Peak grad: 106.9 mmHg
P 1/2 time: 576 msec
Radius: 0.7 cm
S' Lateral: 2.04 cm

## 2022-08-19 ENCOUNTER — Other Ambulatory Visit: Payer: Self-pay | Admitting: Cardiovascular Disease

## 2022-08-19 DIAGNOSIS — I4821 Permanent atrial fibrillation: Secondary | ICD-10-CM

## 2022-08-19 NOTE — Telephone Encounter (Signed)
Warfarin 5mg  tablet Afib Last OV 07/20/22 Last INR 07/20/22

## 2022-08-24 DIAGNOSIS — E7800 Pure hypercholesterolemia, unspecified: Secondary | ICD-10-CM | POA: Diagnosis not present

## 2022-08-24 DIAGNOSIS — R7989 Other specified abnormal findings of blood chemistry: Secondary | ICD-10-CM | POA: Diagnosis not present

## 2022-08-24 DIAGNOSIS — D509 Iron deficiency anemia, unspecified: Secondary | ICD-10-CM | POA: Diagnosis not present

## 2022-08-24 DIAGNOSIS — E039 Hypothyroidism, unspecified: Secondary | ICD-10-CM | POA: Diagnosis not present

## 2022-08-24 DIAGNOSIS — Z125 Encounter for screening for malignant neoplasm of prostate: Secondary | ICD-10-CM | POA: Diagnosis not present

## 2022-08-26 ENCOUNTER — Encounter: Payer: Self-pay | Admitting: Podiatry

## 2022-08-26 ENCOUNTER — Ambulatory Visit: Payer: PPO | Admitting: Podiatry

## 2022-08-26 DIAGNOSIS — B351 Tinea unguium: Secondary | ICD-10-CM

## 2022-08-26 DIAGNOSIS — M79676 Pain in unspecified toe(s): Secondary | ICD-10-CM | POA: Diagnosis not present

## 2022-08-26 NOTE — Progress Notes (Signed)
This patient presents to the office with chief complaint of long thick painful nails.  Patient says the nails are painful walking and wearing shoes.  This patient is unable to self treat.  This patient is unable to trim his nails since she is unable to reach his nails.  he presents to the office for preventative foot care services.  General Appearance  Alert, conversant and in no acute stress.  Vascular  Dorsalis pedis and posterior tibial  pulses are palpable  bilaterally.  Capillary return is within normal limits  bilaterally. Temperature is within normal limits  bilaterally.  Neurologic  Senn-Weinstein monofilament wire test within normal limits  bilaterally. Muscle power within normal limits bilaterally.  Nails Thick disfigured discolored nails with subungual debris  from hallux to fifth toes bilaterally. No evidence of bacterial infection or drainage bilaterally.  Orthopedic  No limitations of motion  feet .  No crepitus or effusions noted.  No bony pathology or digital deformities noted.  Skin  normotropic skin with no porokeratosis noted bilaterally.  No signs of infections or ulcers noted.     Onychomycosis  Nails  B/L.  Pain in right toes  Pain in left toes  Debridement of nails both feet followed trimming the nails with dremel tool.    RTC 10 weeks    Quency Tober DPM   

## 2022-08-31 DIAGNOSIS — Z952 Presence of prosthetic heart valve: Secondary | ICD-10-CM | POA: Diagnosis not present

## 2022-08-31 DIAGNOSIS — Z7901 Long term (current) use of anticoagulants: Secondary | ICD-10-CM | POA: Diagnosis not present

## 2022-08-31 DIAGNOSIS — I10 Essential (primary) hypertension: Secondary | ICD-10-CM | POA: Diagnosis not present

## 2022-08-31 DIAGNOSIS — Z1339 Encounter for screening examination for other mental health and behavioral disorders: Secondary | ICD-10-CM | POA: Diagnosis not present

## 2022-08-31 DIAGNOSIS — R82998 Other abnormal findings in urine: Secondary | ICD-10-CM | POA: Diagnosis not present

## 2022-08-31 DIAGNOSIS — E039 Hypothyroidism, unspecified: Secondary | ICD-10-CM | POA: Diagnosis not present

## 2022-08-31 DIAGNOSIS — E7800 Pure hypercholesterolemia, unspecified: Secondary | ICD-10-CM | POA: Diagnosis not present

## 2022-08-31 DIAGNOSIS — I4821 Permanent atrial fibrillation: Secondary | ICD-10-CM | POA: Diagnosis not present

## 2022-08-31 DIAGNOSIS — Z1331 Encounter for screening for depression: Secondary | ICD-10-CM | POA: Diagnosis not present

## 2022-08-31 DIAGNOSIS — D692 Other nonthrombocytopenic purpura: Secondary | ICD-10-CM | POA: Diagnosis not present

## 2022-08-31 DIAGNOSIS — H900 Conductive hearing loss, bilateral: Secondary | ICD-10-CM | POA: Diagnosis not present

## 2022-08-31 DIAGNOSIS — D509 Iron deficiency anemia, unspecified: Secondary | ICD-10-CM | POA: Diagnosis not present

## 2022-08-31 DIAGNOSIS — G20A1 Parkinson's disease without dyskinesia, without mention of fluctuations: Secondary | ICD-10-CM | POA: Diagnosis not present

## 2022-08-31 DIAGNOSIS — I25810 Atherosclerosis of coronary artery bypass graft(s) without angina pectoris: Secondary | ICD-10-CM | POA: Diagnosis not present

## 2022-08-31 DIAGNOSIS — Z950 Presence of cardiac pacemaker: Secondary | ICD-10-CM | POA: Diagnosis not present

## 2022-08-31 DIAGNOSIS — Z0000 Encounter for general adult medical examination without abnormal findings: Secondary | ICD-10-CM | POA: Diagnosis not present

## 2022-09-01 ENCOUNTER — Ambulatory Visit: Payer: PPO | Admitting: Physical Therapy

## 2022-09-01 ENCOUNTER — Ambulatory Visit: Payer: PPO

## 2022-09-01 ENCOUNTER — Ambulatory Visit: Payer: PPO | Attending: Neurology | Admitting: Occupational Therapy

## 2022-09-01 ENCOUNTER — Telehealth: Payer: Self-pay | Admitting: Physical Therapy

## 2022-09-01 DIAGNOSIS — R471 Dysarthria and anarthria: Secondary | ICD-10-CM | POA: Insufficient documentation

## 2022-09-01 DIAGNOSIS — G20B2 Parkinson's disease with dyskinesia, with fluctuations: Secondary | ICD-10-CM

## 2022-09-01 DIAGNOSIS — R1312 Dysphagia, oropharyngeal phase: Secondary | ICD-10-CM | POA: Insufficient documentation

## 2022-09-01 DIAGNOSIS — R41841 Cognitive communication deficit: Secondary | ICD-10-CM | POA: Insufficient documentation

## 2022-09-01 DIAGNOSIS — R2689 Other abnormalities of gait and mobility: Secondary | ICD-10-CM | POA: Insufficient documentation

## 2022-09-01 DIAGNOSIS — R29818 Other symptoms and signs involving the nervous system: Secondary | ICD-10-CM

## 2022-09-01 NOTE — Therapy (Signed)
[facility] [address] [place], [place], [address] Phone: [phone]   Fax:  [phone]  Patient Details  Name: Donald [name] MRN: [id number] Date of Birth: 07/13/1940 Referring Provider:  Gaspar Garbe, MD  Encounter Date: 09/01/2022   Physical Therapy Parkinson's Disease Screen   Timed Up and Go test:  29.82 sec with U-step (compared to 21.41 sec at d/c)  10 meter walk test: 24.5 sec = 1.34 ft/sec (compared to 2.25 ft/sec at d/c)  5 time sit to stand test: 12 sec with UE support (compared 19.63 sec)  Patient would benefit from Physical Therapy evaluation due to slowed mobility measures compared to discharge in summer 2023.  He is having multiple falls and wife has concerns about falls.     Berry Gallacher W., PT 09/01/2022, 11:21 AM  [facility] [address], [address] [place], [place], [address] Phone: [phone]   Fax:  [phone]

## 2022-09-01 NOTE — Telephone Encounter (Signed)
Good morning, Donald [name] was seen for PT, OT, speech therapy screens in our multi-disciplinary clinic.  Physical therapy is recommended for follow up due to mobility, blaance deficits.  If you agree, please send referral via Epic for physical therapy. *He would prefer the [facility] location due to proximity to their home.  Thank you.  Lonia Blood, PT 09/01/22 11:49 AM Phone: [phone] Fax: [phone]   [facility] [address] [place], [address] [place], [place] [address] Phone # [phone] Fax # [phone]

## 2022-09-01 NOTE — Therapy (Signed)
[facility] [address], [address] [place], [place], [address] Phone: [phone]   Fax:  [phone]  Patient Details  Name: Donald [name] MRN: [id number] Date of Birth: July 27, 1940 Referring Provider:  Gaspar Garbe, MD  Encounter Date: 09/01/2022  Occupational Therapy Parkinson's Disease Screen  Hand dominance:  Left   9-hole peg test:    RUE  3:25        LUE  2:50  Other Comments:  Pt's spouse reports he drops a lot of things, decreased sensation/ possible neuropathy.  Pt is able to dress himself, but is very slow and has difficulty with buttons.  Pt does okay with self-feeding.  Pt falls a lot and spouse wants to focus on PT at this time due to frequent falls and high co-pay.  Pt would benefit from occupational therapy evaluation due to  impaired coordination, dropping items, and slowing with ADLs.  However pt's spouse states due to co-pay she would prefer to focus on PT at this time due to frequent falls.  OT encouraged spouse to reach out to neurologist for referral if/when ready to pursue OT.   Rosalio Loud, OT 09/01/2022, 11:05 AM  [facility] [address] [place], [place], [address] Phone: [phone]   Fax:  [phone]

## 2022-09-02 NOTE — Therapy (Signed)
[place] [address], [address] [place], [place], [address] Phone: [phone]   Fax:  [phone]  Patient Details  Name: Donald [name] MRN: [id number] Date of Birth: September 02, 1940 Referring Provider:  Kerin Salen, DO  Encounter Date: 09/01/2022  Speech Therapy Parkinson's Disease Screen   Decibel Level today: low-mid 60s dB  (WNL=70-72 dB) with sound level meter 30cm away from pt's mouth. Pt's conversational volume has remained sub-WNL as in the last treatment course in 2022.  Pt does not report difficulty with swallowing, which does warrant further evaluation, however pt wife would like to focus on PT at this time due to incr'd frequency of pt falls.   Pt would benefit from speech-language eval for dysarthria,  and a referral for modified barium swallow eval -however wife would like to focus at this time on PT. SLP told wife and pt that if pt desired ST to please contact Dr. Arbutus Leas. Lastly, SLP explained the purpose of an instrumental/objective swallow evaluation such as a FEES or a MBS, and the s/sx to be alert for, as pt had persistent "wet" voice which can be indicative of pharyngeal dysphagia.  If pt does not choose to initiate speech therapy services, recommend ST screen in another 6-8 months  [facility], CCC-SLP 09/02/2022, 9:11 AM  [place] [address], [address] [place], [place], [address] Phone: [phone]   Fax:  [phone]

## 2022-09-03 ENCOUNTER — Ambulatory Visit: Payer: PPO

## 2022-09-08 ENCOUNTER — Ambulatory Visit: Payer: PPO | Admitting: Speech Pathology

## 2022-09-08 ENCOUNTER — Ambulatory Visit: Payer: PPO | Admitting: Physical Therapy

## 2022-09-08 ENCOUNTER — Ambulatory Visit: Payer: PPO | Attending: Cardiology | Admitting: *Deleted

## 2022-09-08 ENCOUNTER — Ambulatory Visit: Payer: PPO | Admitting: Occupational Therapy

## 2022-09-08 DIAGNOSIS — I4821 Permanent atrial fibrillation: Secondary | ICD-10-CM | POA: Diagnosis not present

## 2022-09-08 DIAGNOSIS — Z5181 Encounter for therapeutic drug level monitoring: Secondary | ICD-10-CM | POA: Diagnosis not present

## 2022-09-08 LAB — POCT INR: INR: 4.8 — AB (ref 2.0–3.0)

## 2022-09-08 NOTE — Patient Instructions (Signed)
Description   Do not take any warfarin today and No warfarin tomorrow then continue taking 1 tablet daily except 1/2 tablet on Sundays, Tuesdays, and Thursdays. Recheck INR in 3 weeks.  Call [phone] call with any new medications or if scheduled for any other procedures.  Clearance/Procedure Fax #[phone]

## 2022-09-11 ENCOUNTER — Ambulatory Visit: Payer: PPO | Admitting: Neurology

## 2022-09-11 DIAGNOSIS — K117 Disturbances of salivary secretion: Secondary | ICD-10-CM

## 2022-09-11 MED ORDER — RIMABOTULINUMTOXINB 5000 UNIT/ML IM SOLN
5000.0000 [IU] | Freq: Once | INTRAMUSCULAR | Status: AC
  Administered 2022-09-11: 5000 [IU] via INTRAMUSCULAR

## 2022-09-11 NOTE — Procedures (Signed)
Botulinum Clinic    History:  Diagnosis: Sialorrhea    Result History  Better with higher dose  Consent obtained from: The patients wife The patient was educated on the botulinum toxin the black blox warning and given a copy of the botox patient medication guide.  The patient understands that this warning states that there have been reported cases of the Botox extending beyond the injection site and creating adverse effects, similar to those of botulism. This included loss of strength, trouble walking, hoarseness, trouble saying words clearly, loss of bladder control, trouble breathing, trouble swallowing, diplopia, blurry vision and ptosis. Most of the distant spread of Botox was happening in patients, primarily children, who received medication for spasticity or for cervical dystonia. The patient expressed understanding and desire to proceed.     Injections  Location Left  Right Units Number of sites  Submandibular gland 250 250 500 1 per side  Parotid 2250 2250 4500 1 per side  TOTAL UNITS:     5000      Type of Toxin: Myobloc type B As ordered and injected IM at today's visit Total Units: 2500 Discarded Units: 2500 Needle drawback with each injection was free of blood. Pt tolerated procedure well without complications.   Reinjection is anticipated in 3 months.

## 2022-09-18 ENCOUNTER — Ambulatory Visit (INDEPENDENT_AMBULATORY_CARE_PROVIDER_SITE_OTHER): Payer: PPO

## 2022-09-18 DIAGNOSIS — I495 Sick sinus syndrome: Secondary | ICD-10-CM

## 2022-09-22 ENCOUNTER — Ambulatory Visit: Payer: PPO | Admitting: Physical Therapy

## 2022-09-22 LAB — CUP PACEART REMOTE DEVICE CHECK
Battery Impedance: 2347 Ohm
Battery Remaining Longevity: 35 mo
Battery Voltage: 2.73 V
Brady Statistic RV Percent Paced: 57 %
Date Time Interrogation Session: 20240506154902
Implantable Lead Connection Status: 753985
Implantable Lead Connection Status: 753985
Implantable Lead Implant Date: 20060326
Implantable Lead Implant Date: 20060326
Implantable Lead Location: 753859
Implantable Lead Location: 753860
Implantable Lead Model: 5076
Implantable Lead Model: 5076
Implantable Pulse Generator Implant Date: 20120817
Lead Channel Impedance Value: 454 Ohm
Lead Channel Impedance Value: 67 Ohm
Lead Channel Pacing Threshold Amplitude: 0.875 V
Lead Channel Pacing Threshold Pulse Width: 0.4 ms
Lead Channel Setting Pacing Amplitude: 2.5 V
Lead Channel Setting Pacing Pulse Width: 0.4 ms
Lead Channel Setting Sensing Sensitivity: 5.6 mV
Zone Setting Status: 755011
Zone Setting Status: 755011

## 2022-09-28 ENCOUNTER — Ambulatory Visit: Payer: PPO

## 2022-09-28 ENCOUNTER — Other Ambulatory Visit: Payer: Self-pay

## 2022-09-28 ENCOUNTER — Ambulatory Visit: Payer: PPO | Admitting: Speech Pathology

## 2022-09-28 ENCOUNTER — Encounter: Payer: Self-pay | Admitting: Speech Pathology

## 2022-09-28 ENCOUNTER — Ambulatory Visit: Payer: PPO | Attending: Neurology | Admitting: Occupational Therapy

## 2022-09-28 DIAGNOSIS — Z9181 History of falling: Secondary | ICD-10-CM | POA: Diagnosis not present

## 2022-09-28 DIAGNOSIS — R471 Dysarthria and anarthria: Secondary | ICD-10-CM

## 2022-09-28 DIAGNOSIS — R278 Other lack of coordination: Secondary | ICD-10-CM | POA: Diagnosis not present

## 2022-09-28 DIAGNOSIS — R41841 Cognitive communication deficit: Secondary | ICD-10-CM | POA: Diagnosis not present

## 2022-09-28 DIAGNOSIS — R29818 Other symptoms and signs involving the nervous system: Secondary | ICD-10-CM

## 2022-09-28 DIAGNOSIS — M6281 Muscle weakness (generalized): Secondary | ICD-10-CM | POA: Insufficient documentation

## 2022-09-28 DIAGNOSIS — R1312 Dysphagia, oropharyngeal phase: Secondary | ICD-10-CM | POA: Diagnosis not present

## 2022-09-28 DIAGNOSIS — G20B2 Parkinson's disease with dyskinesia, with fluctuations: Secondary | ICD-10-CM | POA: Insufficient documentation

## 2022-09-28 DIAGNOSIS — R293 Abnormal posture: Secondary | ICD-10-CM | POA: Diagnosis not present

## 2022-09-28 DIAGNOSIS — R2689 Other abnormalities of gait and mobility: Secondary | ICD-10-CM | POA: Insufficient documentation

## 2022-09-28 DIAGNOSIS — R2681 Unsteadiness on feet: Secondary | ICD-10-CM

## 2022-09-28 NOTE — Therapy (Signed)
OUTPATIENT OCCUPATIONAL THERAPY PARKINSON'S EVALUATION  Patient Name: Donald [name] MRN: [id number] DOB:1941-04-13, 82 y.o., male Today's Date: 09/28/2022  PCP: Gaspar Garbe, MD REFERRING PROVIDER: Vladimir Faster, DO  END OF SESSION:  OT End of Session - 09/28/22 1530     Visit Number 1    Number of Visits 17    Date for OT Re-Evaluation 11/27/22    Authorization Type [employer]    OT Start Time 1402    OT Stop Time 1446    OT Time Calculation (min) 44 min             Past Medical History:  Diagnosis Date   Abnormality of gait 08/27/2014   Anxiety    Aortic stenosis    s/p AVR by Dr Laneta Simmers   Arthritis    left wrist- probable- not diagonised   Asthma    in the past   CAD (coronary artery disease)    s/p CABG 2006   GERD (gastroesophageal reflux disease)    H/O seasonal allergies    H/O: rheumatic fever    Hearing deficit    Bilateral hearing aids   Hematuria    had CT scan   Hiatal hernia    History of kidney stones    HOH (hard of hearing)    Hyperlipidemia    Hypertension    Hyperthyroidism    Inguinal hernia    right side; watching at this time per wife's report   Memory difficulty 08/27/2014   Pacemaker    Parkinson's disease    Persistent atrial fibrillation (HCC)    Scarlet fever    as a child   Shortness of breath    Sick sinus syndrome (HCC)    s/p PPM (MDT) 2006   Past Surgical History:  Procedure Laterality Date   AORTIC VALVE REPLACEMENT  2006   CARDIOVERSION N/A 09/14/2013   Procedure: CARDIOVERSION;  Surgeon: Thurmon Fair, MD;  Location: [facility] ENDOSCOPY;  Service: Cardiovascular;  Laterality: N/A;   CHOLECYSTECTOMY N/A 07/21/2013   Procedure: LAPAROSCOPIC CHOLECYSTECTOMY;  Surgeon: Mariella Saa, MD;  Location: [facility] OR;  Service: General;  Laterality: N/A;   COLONOSCOPY W/ POLYPECTOMY     COLONOSCOPY WITH PROPOFOL N/A 08/31/2016   Procedure: COLONOSCOPY WITH PROPOFOL;  Surgeon: Charlott Rakes, MD;  Location: [facility]  ENDOSCOPY;  Service: Endoscopy;  Laterality: N/A;   CORONARY ARTERY BYPASS GRAFT  2006   DENTAL SURGERY     implants   INSERT / REPLACE / REMOVE PACEMAKER  2006, 2012   MDT for sick sinus syndrome, gen change 8/12 by [facility]   KIDNEY STONE SURGERY  1984   Patient Active Problem List   Diagnosis Date Noted   HOH (hard of hearing) 12/18/2020   Personal history of colonic polyps 08/31/2016   Abnormality of gait 08/27/2014   Memory difficulty 08/27/2014   Permanent atrial fibrillation (HCC) 09/08/2013   Cholelithiases 07/19/2013   Cholelithiasis 07/19/2013   Encounter for therapeutic drug monitoring 07/03/2013   Mechanical aortic valve 04/05/2013   Parkinson disease 09/27/2012   Orthostatic hypotension 04/01/2012   Pacemaker-Medtronic 03/29/2012   HEPATITIS A 06/04/2010   HYPERTHYROIDISM 06/04/2010   HYPERLIPIDEMIA 06/04/2010   Essential hypertension 06/04/2010   Coronary artery disease involving native coronary artery of native heart without angina pectoris 06/04/2010   SICK SINUS SYNDROME 06/04/2010   HIATAL HERNIA 06/04/2010   SHORTNESS OF BREATH 06/04/2010   CHEST PAIN 06/04/2010    ONSET DATE: referral date 09/01/22 (PD diagnosis 2014)  REFERRING DIAG: G20.B2 (ICD-10-CM) - Parkinson's disease with dyskinesia and fluctuating manifestations  THERAPY DIAG:  Other symptoms and signs involving the nervous system  Unsteadiness on feet  Abnormal posture  Muscle weakness (generalized)  Other lack of coordination  Rationale for Evaluation and Treatment: Rehabilitation  SUBJECTIVE:   SUBJECTIVE STATEMENT: Pt reports falling 3x this morning.  Pt reports going to [employer] 2x/week.  Spouse reports pt requiring 30 mins to get dressed and having difficulty with clothing fasteners and spilling items during self-feeding. Pt accompanied by: self and significant other  PERTINENT HISTORY: Parkinson's Disease. PMH: hx of falls (wears helment), arthritis, asthma, CAD, GERD,  HOH, HLD, HTN, hyperthyroidism, anxiety, aortic stenosis, hx of aortic valve replacement 2006, memory difficulty, pacemaker, persistent a-fib, hx of scarlet fever, shortness of breath, hx of CABG 2006, cholecystectomy 2015, neuropathy  PRECAUTIONS: Fall  WEIGHT BEARING RESTRICTIONS: No  PAIN:  Are you having pain? Yes: Unrated, but states "I have pain every day I fall"  FALLS: Has patient fallen in last 6 months? Yes. Number of falls fall frequently, reports falling 3x today  LIVING ENVIRONMENT: Lives with: lives with their spouse Lives in: House/apartment Stairs:  ramped entrance Has following equipment at home: Grab bars, Ramped entry, and U-step walker  PLOF: Independent with basic ADLs and Requires assistive device for independence  PATIENT GOALS: to work on his balance  OBJECTIVE:   HAND DOMINANCE: Left  ADLs: Overall ADLs: Pt reports it takes about 30 mins to get dressed, but nobody helps him with that.  Reports that he does not need any help with any bathroom transfers.  He utilizes a hook on pants button instead of buttoning and wears step in shoes to eliminate tying shoes. Transfers/ambulation related to ADLs: Utilizes U-step walker  Equipment: Grab bars and Walk in shower  IADLs: Light housekeeping: changes bed linen Medication management: wife administers Handwriting: 75% legible, shaky  MOBILITY STATUS: Hx of falls and ambulates with U-step walker and falls frequently  POSTURE COMMENTS:  rounded shoulders and forward head  FUNCTIONAL OUTCOME MEASURES: Fastening/unfastening 3 buttons: 5:16.46 (Pt utilizing all fingers intermittently to attempt to fasten buttons) Physical performance test: PPT#2 (simulated eating) 22.93 sec & PPT#4 (donning/doffing jacket): TBD  COORDINATION: 9 Hole Peg test: Right: 2.43.9; Left: 3:47.9 (pt dropping multiple pegs and benefiting from picking up from table top by sliding off edge of table)   UE ROM:   R shoulder flexion 110*, L  shoulder flexion 110*  UE MMT:   WFL  SENSATION: "Feels like ice"  COGNITION: Overall cognitive status: No family/caregiver present to determine baseline cognitive functioning and HOH makes this determination more difficult  OBSERVATIONS: Bradykinesia   TODAY'S TREATMENT:                                                                                                                               09/28/22  NA, eval only   PATIENT EDUCATION: Education details: Educated  on role and purpose of OT as well as potential interventions and goals for therapy based on initial evaluation findings. Person educated: Patient and Spouse Education method: Explanation Education comprehension: verbalized understanding and needs further education  HOME EXERCISE PROGRAM: TBD  GOALS: Goals reviewed with patient? No  SHORT TERM GOALS: Target date: 10/30/22  Pt will perform PD-specific HEP with cueing prn from caregiver  Baseline: Goal status: INITIAL  2.  Pt will improve R hand coordination for ADLs (buttoning/tying shoes) as shown by completing 9-hole peg test in 2 min or less  Baseline: Right: 2.43.9; Left: 3:47.9  Goal status: INITIAL  3.  Pt will improve L hand coordination for ADLs (buttoning/tying shoes) as shown by completing 9-hole peg test in 3 mins or less  Baseline: Right: 2.43.9; Left: 3:47.9  Goal status: INITIAL  4.  Pt/caregiver will verbalize understanding of ways to decr risk of future complcations related to PD (including falls).  Baseline:  Goal status: INITIAL   LONG TERM GOALS: Target date: 11/27/22  Pt/caregiver will verbalize understanding of AE/strategies to increase ease, safety, and independence with ADLs/IADLs  Baseline:  Goal status: INITIAL  2.  Pt will improve functional reaching/coordination for ADLs as shown by improving score on box and blocks test by at least 5 with dominant LUE  Baseline: TBD Goal status: INITIAL  3.  Pt will complete 3  button/unbutton in 4 mins or less with use of AE PRN to demonstrate increased coordination and motor control to manage clothing fasteners. Baseline:  5:16.46  Goal status: INITIAL  4.  Pt will demonstrate improved ease with feeding as evidenced by decreasing PPT#2 (self feeding) by 3 secs Baseline: PPT#2 (simulated eating) 22.93 sec  Goal status: INITIAL  5.  Pt will demonstrate ability to retrieve a lightweight object at moderate range to increase shoulder flexion. Baseline:  Goal status: INITIAL  ASSESSMENT:  CLINICAL IMPRESSION: Patient is a 82 y.o. male who was seen today for occupational therapy evaluation for PD. Pt demonstrates decreased coordination and balance impacting ease and independence with ADLs and IADLs.  Pt is able to complete dressing tasks without assistance, however is requiring increased time and has already made adaptations to aspects of dressing tasks to increase ease.  Pt with frequent falls, possibility of multiple in one day, therefore wears helmet and padding to decrease injury with fall.  Pt will benefit from skilled occupational therapy services to address strength and coordination, ROM, balance, GM/FM control, cognition, safety awareness, introduction of compensatory strategies/AE prn, and implementation of an HEP to improve participation and safety during ADLs and IADLs.   PERFORMANCE DEFICITS: in functional skills including ADLs, IADLs, coordination, ROM, strength, Fine motor control, Gross motor control, mobility, balance, body mechanics, decreased knowledge of use of DME, and UE functional use and psychosocial skills including environmental adaptation and routines and behaviors.   IMPAIRMENTS: are limiting patient from ADLs and IADLs.   COMORBIDITIES:  may have co-morbidities  that affects occupational performance. Patient will benefit from skilled OT to address above impairments and improve overall function.  MODIFICATION OR ASSISTANCE TO COMPLETE  EVALUATION: Min-Moderate modification of tasks or assist with assess necessary to complete an evaluation.  OT OCCUPATIONAL PROFILE AND HISTORY: Detailed assessment: Review of records and additional review of physical, cognitive, psychosocial history related to current functional performance.  CLINICAL DECISION MAKING: Moderate - several treatment options, min-mod task modification necessary  REHAB POTENTIAL: Good  EVALUATION COMPLEXITY: Moderate    PLAN:  OT FREQUENCY: 2x/week  OT DURATION:  8 weeks  PLANNED INTERVENTIONS: self care/ADL training, therapeutic exercise, therapeutic activity, neuromuscular re-education, passive range of motion, balance training, functional mobility training, ultrasound, compression bandaging, moist heat, cryotherapy, patient/family education, cognitive remediation/compensation, psychosocial skills training, energy conservation, coping strategies training, and DME and/or AE instructions  RECOMMENDED OTHER SERVICES: NA  CONSULTED AND AGREED WITH PLAN OF CARE: Patient and family member/caregiver  PLAN FOR NEXT SESSION: Time don/doff jacket, Box and blocks.  Initiate education on large amplitude and full/modified grasp on containers   Kashaun Bebo, OTR/L 09/28/2022, 3:31 PM

## 2022-09-28 NOTE — Therapy (Signed)
OUTPATIENT PHYSICAL THERAPY NEURO EVALUATION   Patient Name: Donald [name] MRN: [id number] DOB:09-Jun-1940, 82 y.o., male Today's Date: 09/28/2022   PCP: Gaspar Garbe, MD REFERRING PROVIDER: Vladimir Faster, DO  END OF SESSION:  PT End of Session - 09/28/22 1420     Visit Number 1    Number of Visits 12    Date for PT Re-Evaluation 11/09/22    Authorization Type [employer]    PT Start Time 1445    PT Stop Time 1530    PT Time Calculation (min) 45 min             Past Medical History:  Diagnosis Date   Abnormality of gait 08/27/2014   Anxiety    Aortic stenosis    s/p AVR by Dr Laneta Simmers   Arthritis    left wrist- probable- not diagonised   Asthma    in the past   CAD (coronary artery disease)    s/p CABG 2006   GERD (gastroesophageal reflux disease)    H/O seasonal allergies    H/O: rheumatic fever    Hearing deficit    Bilateral hearing aids   Hematuria    had CT scan   Hiatal hernia    History of kidney stones    HOH (hard of hearing)    Hyperlipidemia    Hypertension    Hyperthyroidism    Inguinal hernia    right side; watching at this time per wife's report   Memory difficulty 08/27/2014   Pacemaker    Parkinson's disease    Persistent atrial fibrillation (HCC)    Scarlet fever    as a child   Shortness of breath    Sick sinus syndrome (HCC)    s/p PPM (MDT) 2006   Past Surgical History:  Procedure Laterality Date   AORTIC VALVE REPLACEMENT  2006   CARDIOVERSION N/A 09/14/2013   Procedure: CARDIOVERSION;  Surgeon: Thurmon Fair, MD;  Location: [facility] ENDOSCOPY;  Service: Cardiovascular;  Laterality: N/A;   CHOLECYSTECTOMY N/A 07/21/2013   Procedure: LAPAROSCOPIC CHOLECYSTECTOMY;  Surgeon: Mariella Saa, MD;  Location: [facility] OR;  Service: General;  Laterality: N/A;   COLONOSCOPY W/ POLYPECTOMY     COLONOSCOPY WITH PROPOFOL N/A 08/31/2016   Procedure: COLONOSCOPY WITH PROPOFOL;  Surgeon: Charlott Rakes, MD;  Location: [facility] ENDOSCOPY;   Service: Endoscopy;  Laterality: N/A;   CORONARY ARTERY BYPASS GRAFT  2006   DENTAL SURGERY     implants   INSERT / REPLACE / REMOVE PACEMAKER  2006, 2012   MDT for sick sinus syndrome, gen change 8/12 by [facility]   KIDNEY STONE SURGERY  1984   Patient Active Problem List   Diagnosis Date Noted   HOH (hard of hearing) 12/18/2020   Personal history of colonic polyps 08/31/2016   Abnormality of gait 08/27/2014   Memory difficulty 08/27/2014   Permanent atrial fibrillation (HCC) 09/08/2013   Cholelithiases 07/19/2013   Cholelithiasis 07/19/2013   Encounter for therapeutic drug monitoring 07/03/2013   Mechanical aortic valve 04/05/2013   Parkinson disease 09/27/2012   Orthostatic hypotension 04/01/2012   Pacemaker-Medtronic 03/29/2012   HEPATITIS A 06/04/2010   HYPERTHYROIDISM 06/04/2010   HYPERLIPIDEMIA 06/04/2010   Essential hypertension 06/04/2010   Coronary artery disease involving native coronary artery of native heart without angina pectoris 06/04/2010   SICK SINUS SYNDROME 06/04/2010   HIATAL HERNIA 06/04/2010   SHORTNESS OF BREATH 06/04/2010   CHEST PAIN 06/04/2010    ONSET DATE: 2014  REFERRING DIAG:  G20.B2 (ICD-10-CM) - Parkinson's disease with dyskinesia and fluctuating manifestations    THERAPY DIAG:  Parkinson's disease with dyskinesia and fluctuating manifestations  Other abnormalities of gait and mobility  Unsteadiness on feet  Abnormal posture  Muscle weakness (generalized)  History of falling  Rationale for Evaluation and Treatment: Rehabilitation  SUBJECTIVE:                                                                                                                                                                                             SUBJECTIVE STATEMENT: Pt with history of PD with worsening balance and control as of late. Goes to [employer] 2x/wk Pt accompanied by: self-spouse in lobby  PERTINENT HISTORY: (from recent PT  screen)  Timed Up and Go test:  29.82 sec with U-step (compared to 21.41 sec at d/c)  10 meter walk test: 24.5 sec = 1.34 ft/sec (compared to 2.25 ft/sec at d/c)  5 time sit to stand test: 12 sec with UE support (compared 19.63 sec)  PAIN:  Are you having pain? No  PRECAUTIONS: Fall  WEIGHT BEARING RESTRICTIONS: No  FALLS: Has patient fallen in last 6 months? Yes. Number of falls numerous/often multiple per day  LIVING ENVIRONMENT: Lives with: lives with their spouse Lives in: House/apartment Stairs:  reports ramp access in/out Has following equipment at home: Environmental consultant - 4 wheeled and U-step  PLOF: Requires assistive device for independence and Needs assistance with homemaking  PATIENT GOALS: Improve balance and walking  OBJECTIVE:   DIAGNOSTIC FINDINGS:   COGNITION: Overall cognitive status: No family/caregiver present to determine baseline cognitive functioning, HOH makes this determination more difficult   SENSATION: WFL  COORDINATION: Grossly impaired    MUSCLE TONE: ratcheting in BLE  MUSCLE LENGTH: Hamstrings: Right -20 deg; Left -20 deg     POSTURE: rounded shoulders and forward head  LOWER EXTREMITY ROM:     Active  Right Eval Left Eval  Hip flexion    Hip extension    Hip abduction    Hip adduction    Hip internal rotation    Hip external rotation    Knee flexion -10 -10  Knee extension    Ankle dorsiflexion 8 11  Ankle plantarflexion    Ankle inversion    Ankle eversion     (Blank rows = not tested)  LOWER EXTREMITY MMT:    Grossly 4/5 BLE  BED MOBILITY:  NT  TRANSFERS: Assistive device utilized: Environmental consultant - 4 wheeled  Sit to stand: Modified independence and CGA Stand to sit: Modified independence and SBA Chair to chair: Modified independence and CGA Floor:  NT  RAMP:  NT  CURB:  Level of Assistance: CGA Assistive device utilized: Environmental consultant - 4 wheeled Curb Comments:   STAIRS: NT  GAIT: Gait pattern: shuffling and  festinating Distance walked:  Assistive device utilized:  U-step Level of assistance: CGA and Min A Comments:   FUNCTIONAL TESTS:  5 times sit to stand: 33 sec w/ retro-LOB, pushing backs of knees to chair, multiple attempts needed Timed up and go (TUG): 23 sec. TUG w/ cog: 32.91 sec 10 meter walk test: 18.12 sec = 1.8 ft/sec  Mini-BESTest: 7/28      PATIENT EDUCATION: Education details: assessment findings Person educated: Patient Education method: Explanation Education comprehension: verbalized understanding  HOME EXERCISE PROGRAM: TBD  GOALS: Goals reviewed with patient? Yes  SHORT TERM GOALS: Target date: 10/19/2022    Patient will perform HEP with family/caregiver supervision for improved strength, balance, transfers, and gait  Baseline: Goal status: INITIAL  2.  Manifest improved balance and BLE strength per 20 sec 5xSTS test without compensatory movements Baseline: 33 sec w/ compensations Goal status: INITIAL    LONG TERM GOALS: Target date: 11/09/2022    Reduce risk for falls per score 18/28 Mini-BESTest Baseline: 7/28 Goal status: INITIAL  2.  Demo improved efficiency and safety with ambulation maintaining speed of 2.25 ft/sec w/ least restrictive AD to improve community ambulation Baseline: 1.8 ft/sec Goal status: INITIAL  3.  Demo improved safety with gait and negotiating turns per time of 15 sec TUG test Baseline: 23 sec w/ U-step Goal status: INITIAL    ASSESSMENT:  CLINICAL IMPRESSION: Patient is a 82 y.o. man who was seen today for physical therapy evaluation and treatment for PD. Demonstrates significant deficits and limitations in functional mobility and increased risk for falls per above outcome measures.  Tactile guarding and assist needed throughout assessment and mobility due to unsteadiness and motor control/planning deficits.  Postural deviations, strength, and balance deficits present limiting safety with functional mobility. Pt would  benefit from PT services to address these areas of limitations to hopefully reduce risk for falls.   OBJECTIVE IMPAIRMENTS: Abnormal gait, decreased activity tolerance, decreased balance, decreased coordination, decreased endurance, decreased mobility, difficulty walking, decreased ROM, decreased strength, impaired flexibility, impaired tone, improper body mechanics, and postural dysfunction.   ACTIVITY LIMITATIONS: carrying, lifting, bending, standing, transfers, reach over head, and locomotion level  PARTICIPATION LIMITATIONS: meal prep, cleaning, laundry, driving, shopping, community activity, and yard work  PERSONAL FACTORS: Age, Time since onset of injury/illness/exacerbation, and 3+ comorbidities: PMH  are also affecting patient's functional outcome.   REHAB POTENTIAL: Good  CLINICAL DECISION MAKING: Evolving/moderate complexity  EVALUATION COMPLEXITY: Moderate  PLAN:  PT FREQUENCY: 1-2x/week  PT DURATION: 6 weeks  PLANNED INTERVENTIONS: Therapeutic exercises, Therapeutic activity, Neuromuscular re-education, Balance training, Gait training, Patient/Family education, Self Care, Joint mobilization, Stair training, Vestibular training, Canalith repositioning, DME instructions, Aquatic Therapy, Dry Needling, Electrical stimulation, Spinal mobilization, Cryotherapy, Moist heat, Taping, Traction, Ultrasound, Ionotophoresis 4mg /ml Dexamethasone, and Manual therapy  PLAN FOR NEXT SESSION: seated PWR moves?   4:01 PM, 09/28/22 M. Shary Decamp, PT, DPT Physical Therapist- [place] Office Number: [phone]

## 2022-09-28 NOTE — Therapy (Signed)
OUTPATIENT SPEECH LANGUAGE PATHOLOGY PARKINSON'S EVALUATION   Patient Name: Donald [name] MRN: [id number] DOB:Oct 10, 1940, 82 y.o., male Today's Date: 09/28/2022  PCP: Gaspar Garbe, MD REFERRING PROVIDER: Vladimir Faster, DO  END OF SESSION:  End of Session - 09/28/22 1711     Visit Number 1    Number of Visits 17    Date for SLP Re-Evaluation 11/23/22    Authorization Type HTA    SLP Start Time 1530    SLP Stop Time  1615    SLP Time Calculation (min) 45 min    Activity Tolerance Patient tolerated treatment well             Past Medical History:  Diagnosis Date   Abnormality of gait 08/27/2014   Anxiety    Aortic stenosis    s/p AVR by Dr Laneta Simmers   Arthritis    left wrist- probable- not diagonised   Asthma    in the past   CAD (coronary artery disease)    s/p CABG 2006   GERD (gastroesophageal reflux disease)    H/O seasonal allergies    H/O: rheumatic fever    Hearing deficit    Bilateral hearing aids   Hematuria    had CT scan   Hiatal hernia    History of kidney stones    HOH (hard of hearing)    Hyperlipidemia    Hypertension    Hyperthyroidism    Inguinal hernia    right side; watching at this time per wife's report   Memory difficulty 08/27/2014   Pacemaker    Parkinson's disease    Persistent atrial fibrillation (HCC)    Scarlet fever    as a child   Shortness of breath    Sick sinus syndrome (HCC)    s/p PPM (MDT) 2006   Past Surgical History:  Procedure Laterality Date   AORTIC VALVE REPLACEMENT  2006   CARDIOVERSION N/A 09/14/2013   Procedure: CARDIOVERSION;  Surgeon: Thurmon Fair, MD;  Location: [facility] ENDOSCOPY;  Service: Cardiovascular;  Laterality: N/A;   CHOLECYSTECTOMY N/A 07/21/2013   Procedure: LAPAROSCOPIC CHOLECYSTECTOMY;  Surgeon: Mariella Saa, MD;  Location: [facility] OR;  Service: General;  Laterality: N/A;   COLONOSCOPY W/ POLYPECTOMY     COLONOSCOPY WITH PROPOFOL N/A 08/31/2016   Procedure: COLONOSCOPY WITH PROPOFOL;   Surgeon: Charlott Rakes, MD;  Location: [facility] ENDOSCOPY;  Service: Endoscopy;  Laterality: N/A;   CORONARY ARTERY BYPASS GRAFT  2006   DENTAL SURGERY     implants   INSERT / REPLACE / REMOVE PACEMAKER  2006, 2012   MDT for sick sinus syndrome, gen change 8/12 by [facility]   KIDNEY STONE SURGERY  1984   Patient Active Problem List   Diagnosis Date Noted   HOH (hard of hearing) 12/18/2020   Personal history of colonic polyps 08/31/2016   Abnormality of gait 08/27/2014   Memory difficulty 08/27/2014   Permanent atrial fibrillation (HCC) 09/08/2013   Cholelithiases 07/19/2013   Cholelithiasis 07/19/2013   Encounter for therapeutic drug monitoring 07/03/2013   Mechanical aortic valve 04/05/2013   Parkinson disease 09/27/2012   Orthostatic hypotension 04/01/2012   Pacemaker-[employer]tr[employer] 03/29/2012   HEPATITIS A 06/04/2010   HYPERTHYROIDISM 06/04/2010   HYPERLIPIDEMIA 06/04/2010   Essential hypertension 06/04/2010   Coronary artery disease involving native coronary artery of native heart without angina pectoris 06/04/2010   SICK SINUS SYNDROME 06/04/2010   HIATAL HERNIA 06/04/2010   SHORTNESS OF BREATH 06/04/2010   CHEST PAIN 06/04/2010  ONSET DATE: referred 09/01/22  REFERRING DIAG: G20.B2 (ICD-10-CM) - Parkinson's disease with dyskinesia and fluctuating manifestations   THERAPY DIAG:  Dysarthria and anarthria  Cognitive communication deficit  Dysphagia, oropharyngeal phase  Rationale for Evaluation and Treatment: Rehabilitation  SUBJECTIVE:   SUBJECTIVE STATEMENT: Report vague recall of prior ST course Pt accompanied by: significant other (in lobby but available to provide SLP with information)   PERTINENT HISTORY: Dx wtih PD 2012. MBSS in July 2020 revealed moderate dysphagia with significant pharyngeal residue, silent aspiration, reduce tongue base, epiglittic inversion, pharyngeal contraction, hocking and drool. Prior ST course 2022 at [facility].   PAIN:  Are you having  pain? No  FALLS: Has patient fallen in last 6 months?  See PT evaluation for details  LIVING ENVIRONMENT: Lives with: lives with their spouse Lives in: House/apartment  PLOF:  Level of assistance: Needed assistance with ADLs, Needed assistance with IADLS Employment: Retired  PATIENT GOALS: swallow and talk better  OBJECTIVE:   DIAGNOSTIC FINDINGS: MBSS 2022 Clinical Impression Patient presents with mildly worsening moderate oropharyngeal dysphagia c/b sensorimotor deficits.  Oral control and strength impaired allowing premature spillage of liquid into pharynx.    Pharyngeal dysphagia is worse than 2020 study - Pharyngeal contraction, tongue base retraction, epiglottic deflection and airway closure are compromised resulting in variable pharyngeal retention (vallecular more than pyriform and worse with increased viscosity) without awareness.  Pharyngeal secretions present and mixed with barium at pyriform sinus.  With puree - 30% of bolus transited into esophagus with first swallow, dys3- severe pharyngeal retention noted.  Pt senses very large amounts of retention but not mod and mild amounts. Dry swallows not effective to decrease retention - however liquid swallows helpful to decrease but not fully clear.  Thin and nectar aspirated due to decreased adequacy of laryngeal closure, from penetrates transiting below vocal cords and also from pharyngeal retention spilling into airway post-swallow.      COGNITION: Overall cognitive status: Impaired Areas of impairment: Executive function, Attention and Memory Comments: baseline, 2/2 PD  MOTOR SPEECH: Overall motor speech: impaired Level of impairment: Word Respiration: speaking on residual capacity Phonation: wet, hoarse, and low vocal intensity Resonance: WFL Articulation: Impaired: word Intelligibility: Intelligibility reduced ~40% to trained listener Motor planning: Appears intact Motor speech errors: unaware Interfering components:   co-morbidities  Effective technique: slow rate, increased vocal intensity, and over articulate  ORAL MOTOR EXAMINATION: Overall status: Impaired Comments: decreased strength, ROM, and coordination: labial and lingual; white coating on tongue  OBJECTIVE VOICE ASSESSMENT: Sustained "ah" maximum phonation time: 8 seconds Sustained "ah" loudness average: 77 dB Oral reading (passage) loudness average: 64 dB Conversational loudness average: 65 dB Conversational loudness range: 62-68 dB Voice quality: hoarse and low vocal intensity Stimulability trials: Given SLP modeling and consistent max cues, loudness average increased to 74 dB at phrase reading level.   Completed audio recording of patients baseline voice without cueing from SLP: No   CLINICAL SWALLOW ASSESSMENT:   Current diet: regular & thin Dentition: poor condition Patient directly observed with POs: Yes: thin liquids  Feeding: able to feed self Liquids provided by: straw Oral phase signs and symptoms: none Pharyngeal phase signs and symptoms: immediate cough, all trials Comments: dysphonia present throughout evaluation, wet vocal quality does not clear with cued throat clear and reswallow  PATIENT REPORTED OUTCOME MEASURES (PROM): To be completed initial therapy session  TODAY'S TREATMENT:  DATE:  09/28/22: Initiated training on dysarthria strategies: slow, loud, over-articulate, with practice at phrase reading and generative levels. Direct model required to optimize production and improve intelligibility. Education on POC and goals, to which pt and spouse verbalize agreement.   PATIENT EDUCATION: Education details: see above Person educated: Patient and Spouse Education method: Explanation, Demonstration, Verbal cues, and Handouts Education comprehension: verbalized understanding, returned  demonstration, verbal cues required, and needs further education  HOME EXERCISE PROGRAM: Sustained "ah," oral reading   GOALS: Goals reviewed with patient? Yes  SHORT TERM GOALS: Target date: 10/26/2022  Pt will participate in MBSS Baseline: Goal status: INITIAL  2.  Pt will demonstrate use of swallow compensations with use of visual aid and occasional verbal cues during structured practice Baseline:  Goal status: INITIAL  3.  Pt will use dysarthria strategies, resulting in intelligible production of 15/20 sentences  Baseline:  Goal status: INITIAL  4.  Pt will demonstrate dysphagia exercises with mod-A over 2 sessions  Baseline:  Goal status: INITIAL   LONG TERM GOALS: Target date: 11/23/2022  Pt will complete dysphagia and dysarthria HEP 5/7 days over 1 week period Baseline:  Goal status: INITIAL  2.  Pt and spouse will report carryover of swallow compensations and strategies at home Baseline:  Goal status: INITIAL  3.  Pt will use dysarthria strategies during 10 minute conversation, resulting in 80% intelligibility  Baseline:  Goal status: INITIAL  ASSESSMENT:  CLINICAL IMPRESSION: Patient is a 82 y.o. M who was seen today for dysphagia and motor speech evaluation 2/2 PD. Evaluation reveals moderate dysarthria, c/b low vocal intensity and imprecise articulation which results in significantly decreased intelligibility. Stimulibility for improved productions evidenced using dysarthria strategies with max-A from SLP. Pt reporting worsening dysphagia, usual coughing and choking with liquids and solids. Oral motor exam c/w PD. Thin liquid PO trials with consistant coughing following all trials. Last MBSS 2022, SLP recommends updated instrumental to ensure therapeutic interventions and diet recommendations are patient specific. Pt agreeable. Pt would benefit from skilled ST to address aforementioned deficits to reduce caregiver burden , enhance communication efficacy, and  facilitate improved swallow function/safety .   OBJECTIVE IMPAIRMENTS: Objective impairments include attention, memory, executive functioning, dysarthria, and dysphagia. These impairments are limiting patient from effectively communicating at home and in community and safety when swallowing.Factors affecting potential to achieve goals and functional outcome are ability to learn/carryover information and co-morbidities.. Patient will benefit from skilled SLP services to address above impairments and improve overall function.  REHAB POTENTIAL: Good  PLAN:  SLP FREQUENCY: 2x/week  SLP DURATION: 8 weeks  PLANNED INTERVENTIONS: Aspiration precaution training, Pharyngeal strengthening exercises, Diet toleration management , Environmental controls, Trials of upgraded texture/liquids, Cueing hierachy, Internal/external aids, Functional tasks, Multimodal communication approach, SLP instruction and feedback, Compensatory strategies, Patient/family education, and Re-evaluation    Maia Breslow, CCC-SLP 09/28/2022, 5:26 PM

## 2022-09-29 ENCOUNTER — Ambulatory Visit: Payer: PPO | Attending: Internal Medicine | Admitting: *Deleted

## 2022-09-29 ENCOUNTER — Telehealth (HOSPITAL_COMMUNITY): Payer: Self-pay

## 2022-09-29 DIAGNOSIS — Z5181 Encounter for therapeutic drug level monitoring: Secondary | ICD-10-CM

## 2022-09-29 LAB — POCT INR: INR: 2.7 (ref 2.0–3.0)

## 2022-09-29 NOTE — Patient Instructions (Signed)
Description   Continue taking 1 tablet daily except 1/2 tablet on Sundays, Tuesdays, and Thursdays. Recheck INR in 4 weeks.  Call [phone] call with any new medications or if scheduled for any other procedures.  Clearance/Procedure Fax #[phone]

## 2022-09-29 NOTE — Telephone Encounter (Signed)
Attempted to contact patient to schedule OP Modified Barium Swallow - left voicemail. 

## 2022-09-30 ENCOUNTER — Ambulatory Visit: Payer: PPO

## 2022-09-30 ENCOUNTER — Encounter: Payer: Self-pay | Admitting: Physical Therapy

## 2022-09-30 ENCOUNTER — Ambulatory Visit: Payer: PPO | Admitting: Physical Therapy

## 2022-09-30 ENCOUNTER — Ambulatory Visit: Payer: PPO | Admitting: Occupational Therapy

## 2022-09-30 DIAGNOSIS — R41841 Cognitive communication deficit: Secondary | ICD-10-CM

## 2022-09-30 DIAGNOSIS — R471 Dysarthria and anarthria: Secondary | ICD-10-CM

## 2022-09-30 DIAGNOSIS — R29818 Other symptoms and signs involving the nervous system: Secondary | ICD-10-CM | POA: Diagnosis not present

## 2022-09-30 DIAGNOSIS — R2689 Other abnormalities of gait and mobility: Secondary | ICD-10-CM

## 2022-09-30 DIAGNOSIS — R278 Other lack of coordination: Secondary | ICD-10-CM

## 2022-09-30 DIAGNOSIS — M6281 Muscle weakness (generalized): Secondary | ICD-10-CM

## 2022-09-30 DIAGNOSIS — R2681 Unsteadiness on feet: Secondary | ICD-10-CM

## 2022-09-30 DIAGNOSIS — R1312 Dysphagia, oropharyngeal phase: Secondary | ICD-10-CM

## 2022-09-30 DIAGNOSIS — R293 Abnormal posture: Secondary | ICD-10-CM

## 2022-09-30 NOTE — Patient Instructions (Signed)
Things to think about/try at home:   - when picking up a cup: make sure that you have full, complete grasp of the cup in your hand to ensure increased security and motor control.  - when donning jacket: bring jacket sleeve all the way up arm to shoulder, bring it around shoulders/neck, and then place other arm in sleeve, to decrease need to reach behind back.

## 2022-09-30 NOTE — Progress Notes (Signed)
Assessment/Plan:   1.  Parkinsons Disease  -continue carbidopa/levodopa 25/100, 2 tablets @ 7am / 9:30 am/ 12 pm /2:30 pm /5 pm and 7:30 pm   -Continue carbidopa/levodopa 50/200 CR at bedtime  -continue Ropinirole, 2 mg 3 times per day.    -CT head today due to fall and hit head.  Did prior authorization while he was in the office.  On coumadin and on it for about send cannot get off of that.  I will let his cardiologist know.  Discussed with patient and wife that whenever he falls and hits his head, he needs evaluation because of the fact he is on Coumadin.  2.  Memory change  -May have PDD, but difficult to tell given the degree of hearing loss he has.  We have fully taken him off of entacapone.    3.  Dysphagia  -pt had mbe on 10/26 with mod oropharyngeal dysphagia.  Felt to be severe aspiration risk.    4.  Sialorrhea  -On Myobloc.  He is back on 5000 units.  Felt that 2500 was not enough and continued to drool with that dose.  5.  Low BP  -On 10 mg in the morning, 5 mg with lunch and dinner.  Seems to be doing better with this dosage.  6.  Mechanical heart valve  -On Coumadin.  Cannot get off of this because of the mechanical heart valve.  He has had multiple falls, so we really need to be careful. Subjective:   Donald [name] was seen today in follow up for Parkinsons disease.  My previous records were reviewed prior to todays visit as well as outside records available to me. Pt with wife who supplements the history.   Pt back on higher dose of myobloc.  Seems to be helping.  He just started physical, occupational and speech therapies.  Last visit, we started him on midodrine.  Wife emailed me about a month after I saw him and his blood pressures were still running pretty low.  We decided to go ahead and increase the midodrine.  He is now taking 10mg /5mg /5mg .  Wife states that most BP are 110's-120's.  No passing out.  No longer c/o dizziness.  Had a fall a few days ago.  He had  the helmet on but "it must have fallen off" per wife.  Wife states that he was in his shed on uneven ground and she knew he hit his hand but didn't know he hit face until it started turning colors around the eye.  He is in PT/OT/ST x 1 week.    Current prescribed movement disorder medications: carbidopa/levodopa 25/100, 2 tablets @ 7am / 9:30 am/ 12 pm /2:30 pm /5 pm and 7:30 pm Carbidopa/levodopa 50/200 CR at bedtime Ropinirole, 2mg  3 times per day (from 3 mg 3 times per day) Midodrine, 5 mg, 2 in the AM, 1 at lunch and dinner.    Prior medications: Carbidopa/levodopa 25/250; entacapone  ALLERGIES:   Allergies  Allergen Reactions   Sulfa Antibiotics     unknown    CURRENT MEDICATIONS:  Outpatient Encounter Medications as of 10/02/2022  Medication Sig   amoxicillin (AMOXIL) 500 MG capsule Take 4 capsules by mouth as directed. Prior to dental appts   aspirin 81 MG tablet Take 81 mg by mouth daily.   atorvastatin (LIPITOR) 20 MG tablet TAKE ONE TABLET BY MOUTH EVERYDAY AT BEDTIME   carbidopa-levodopa (SINEMET CR) 50-200 MG tablet TAKE ONE TABLET BY MOUTH  EVERYDAY AT BEDTIME   carbidopa-levodopa (SINEMET IR) 25-100 MG tablet take 2 tablets by mouth six times a day at 7am / 9:30 am/ 12 pm /2:30 pm /5 pm and 7:30 pm   esomeprazole (NEXIUM) 20 MG capsule Take 40 mg by mouth daily.   levothyroxine (SYNTHROID, LEVOTHROID) 125 MCG tablet Take 125 mcg by mouth daily.   midodrine (PROAMATINE) 5 MG tablet 2 with breakfast, 1 with lunch and dinner   nitroGLYCERIN (NITROSTAT) 0.4 MG SL tablet PLACE 1 TABLET UNDER TONGUE EVERY 5 MINUTES FOR 3 DOSES AS NEEDED FOR CHEST PAIN   rOPINIRole (REQUIP) 2 MG tablet TAKE ONE TABLET BY MOUTH BEFORE BREAKFAST AND TAKE ONE TABLET BY MOUTH AT NOON AND TAKE ONE TABLET BY MOUTH EVERY EVENING   warfarin (COUMADIN) 5 MG tablet TAKE 1/2 TO 1 TABLET BY MOUTH daily OR AS DIRECTED by coumadin clinic   No facility-administered encounter medications on file as of 10/02/2022.     Objective:   PHYSICAL EXAMINATION:    VITALS:   Vitals:   10/02/22 1037  BP: (!) 110/52  Pulse: 76  SpO2: 97%  Weight: 140 lb (63.5 kg)  Height: 5\' 8"  (1.727 m)    No data found.   GEN:  The patient appears stated age and is in NAD. HEENT:  Normocephalic.  Large area of ecchymosis around the right eye and right temple.  Pt in helmet.  The mucous membranes are moist. The superficial temporal arteries are without ropiness or tenderness. CV:  RRR Lungs:  CTAB Neck/HEME:  There are no carotid bruits bilaterally.  Neurological examination:  Orientation: The patient is alert and oriented x3. Cranial nerves: There is good facial symmetry with facial hypomimia. The speech is fluent and dysarthric and hypophonic. Soft palate rises symmetrically and there is no tongue deviation. Hearing is markedly decreased to conversational tone. Sensation: Sensation is intact to light touch throughout Motor: Strength is at least antigravity x4.  Movement examination: Tone: There is normal tone in the bilateral upper extremities.  The tone in the lower extremities is normal.  Abnormal movements: There is no tremor today.  No dyskinesia Coordination:  There is slowness of finger taps and toe taps, L>R Gait and Station: The patient pushes off of the chair to arise.  He is given his walker.  He is short stepped and walks on toes and festinates.  This is same as previous  I have reviewed and interpreted the following labs independently    Chemistry      Component Value Date/Time   NA 139 07/01/2021 1150   K 4.5 07/01/2021 1150   CL 102 07/01/2021 1150   CO2 25 07/01/2021 1150   BUN 13 07/01/2021 1150   CREATININE 0.62 (L) 07/01/2021 1150      Component Value Date/Time   CALCIUM 9.3 07/01/2021 1150   ALKPHOS 174 (H) 07/01/2021 1150   AST 15 07/01/2021 1150   ALT 7 07/01/2021 1150   BILITOT 1.4 (H) 07/01/2021 1150       Lab Results  Component Value Date   WBC 4.8 07/01/2021   HGB  11.9 (L) 07/01/2021   HCT 36.3 (L) 07/01/2021   MCV 85 07/01/2021   PLT 235 07/01/2021    No results found for: "TSH"  No results found for: "VITAMINB12"     Cc:  Tisovec, Adelfa Koh, MD

## 2022-09-30 NOTE — Therapy (Signed)
OUTPATIENT OCCUPATIONAL THERAPY [facility]  Treatment Note  Patient Name: Donald [name] MRN: [id number] DOB:[birth year], 82 y.o., male Today's Date: 09/30/2022  PCP: Gaspar Garbe, MD REFERRING PROVIDER: Vladimir Faster, DO  END OF SESSION:  OT End of Session - 09/30/22 1500     Visit Number 2    Number of Visits 17    Date for OT Re-Evaluation 11/27/22    Authorization Type [employer]    OT Start Time 1450    OT Stop Time 1530    OT Time Calculation (min) 40 min              Past Medical History:  Diagnosis Date   Abnormality of gait 08/27/2014   Anxiety    Aortic stenosis    s/p AVR by Dr Laneta Simmers   Arthritis    left wrist- probable- not diagonised   Asthma    in the past   CAD (coronary artery disease)    s/p CABG 2006   GERD (gastroesophageal reflux disease)    H/O seasonal allergies    H/O: rheumatic fever    Hearing deficit    Bilateral hearing aids   Hematuria    had CT scan   Hiatal hernia    History of kidney stones    HOH (hard of hearing)    Hyperlipidemia    Hypertension    Hyperthyroidism    Inguinal hernia    right side; watching at this time per wife's report   Memory difficulty 08/27/2014   Pacemaker    Parkinson's disease    Persistent atrial fibrillation (HCC)    Scarlet fever    as a child   Shortness of breath    Sick sinus syndrome (HCC)    s/p PPM (MDT) 2006   Past Surgical History:  Procedure Laterality Date   AORTIC VALVE REPLACEMENT  2006   CARDIOVERSION N/A 09/14/2013   Procedure: CARDIOVERSION;  Surgeon: Thurmon Fair, MD;  Location: [facility] ENDOSCOPY;  Service: Cardiovascular;  Laterality: N/A;   CHOLECYSTECTOMY N/A 07/21/2013   Procedure: LAPAROSCOPIC CHOLECYSTECTOMY;  Surgeon: Mariella Saa, MD;  Location: [facility] OR;  Service: General;  Laterality: N/A;   COLONOSCOPY W/ POLYPECTOMY     COLONOSCOPY WITH PROPOFOL N/A 08/31/2016   Procedure: COLONOSCOPY WITH PROPOFOL;  Surgeon: Charlott Rakes, MD;  Location:  [facility] ENDOSCOPY;  Service: Endoscopy;  Laterality: N/A;   CORONARY ARTERY BYPASS GRAFT  2006   DENTAL SURGERY     implants   INSERT / REPLACE / REMOVE PACEMAKER  2006, 2012   MDT for sick sinus syndrome, gen change 8/12 by [facility]   KIDNEY STONE SURGERY  1984   Patient Active Problem List   Diagnosis Date Noted   HOH (hard of hearing) 12/18/2020   Personal history of colonic polyps 08/31/2016   Abnormality of gait 08/27/2014   Memory difficulty 08/27/2014   Permanent atrial fibrillation (HCC) 09/08/2013   Cholelithiases 07/19/2013   Cholelithiasis 07/19/2013   Encounter for therapeutic drug monitoring 07/03/2013   Mechanical aortic valve 04/05/2013   Parkinson disease 09/27/2012   Orthostatic hypotension 04/01/2012   Pacemaker-Medtronic 03/29/2012   HEPATITIS A 06/04/2010   HYPERTHYROIDISM 06/04/2010   HYPERLIPIDEMIA 06/04/2010   Essential hypertension 06/04/2010   Coronary artery disease involving native coronary artery of native heart without angina pectoris 06/04/2010   SICK SINUS SYNDROME 06/04/2010   HIATAL HERNIA 06/04/2010   SHORTNESS OF BREATH 06/04/2010   CHEST PAIN 06/04/2010    ONSET DATE: referral date 09/01/22 (PD  diagnosis 2014)  REFERRING DIAG: G20.B2 (ICD-10-CM) - Parkinson's disease with dyskinesia and fluctuating manifestations  THERAPY DIAG:  Unsteadiness on feet  Abnormal posture  Muscle weakness (generalized)  Other symptoms and signs involving the nervous system  Other lack of coordination  Rationale for Evaluation and Treatment: Rehabilitation  SUBJECTIVE:   SUBJECTIVE STATEMENT: Pt's spouse reports pt tolerating 3 visits on Monday.  Pt reports 3 falls since evals on Monday. Pt accompanied by: self and significant other  PERTINENT HISTORY: Parkinson's Disease. PMH: hx of falls (wears helment), arthritis, asthma, CAD, GERD, HOH, HLD, HTN, hyperthyroidism, anxiety, aortic stenosis, hx of aortic valve replacement 2006, memory difficulty, pacemaker,  persistent a-fib, hx of scarlet fever, shortness of breath, hx of CABG 2006, cholecystectomy 2015, neuropathy  PRECAUTIONS: Fall  WEIGHT BEARING RESTRICTIONS: No  PAIN:  Are you having pain? Yes: Unrated, but states "I have pain every day I fall"  FALLS: Has patient fallen in last 6 months? Yes. Number of falls fall frequently, reports falling 3x today  LIVING ENVIRONMENT: Lives with: lives with their spouse Lives in: House/apartment Stairs:  ramped entrance Has following equipment at home: Grab bars, Ramped entry, and U-step walker  PLOF: Independent with basic ADLs and Requires assistive device for independence  PATIENT GOALS: to work on his balance  OBJECTIVE:   HAND DOMINANCE: Left  ADLs: Overall ADLs: Pt reports it takes about 30 mins to get dressed, but nobody helps him with that.  Reports that he does not need any help with any bathroom transfers.  He utilizes a hook on pants button instead of buttoning and wears step in shoes to eliminate tying shoes. Transfers/ambulation related to ADLs: Utilizes U-step walker  Equipment: Grab bars and Walk in shower  IADLs: Light housekeeping: changes bed linen Medication management: wife administers Handwriting: 75% legible, shaky  MOBILITY STATUS: Hx of falls and ambulates with U-step walker and falls frequently  POSTURE COMMENTS:  rounded shoulders and forward head  FUNCTIONAL OUTCOME MEASURES: Fastening/unfastening 3 buttons: 5:16.46 (Pt utilizing all fingers intermittently to attempt to fasten buttons) Physical performance test: PPT#2 (simulated eating) 22.93 sec & PPT#4 (donning/doffing jacket): 2:41.1 (attempted for 45 seconds with inability to thread 2nd arm, therefore switched arms)  COORDINATION: 9 Hole Peg test: Right: 2.43.9; Left: 3:47.9 (pt dropping multiple pegs and benefiting from picking up from table top by sliding off edge of table)  09/30/22: Box and blocks: L: 28 blocks, R: 37 blocks  UE ROM:   R shoulder  flexion 110*, L shoulder flexion 110*  SENSATION: "Feels like ice"  COGNITION: Overall cognitive status: No family/caregiver present to determine baseline cognitive functioning and HOH makes this determination more difficult  OBSERVATIONS: Bradykinesia   TODAY'S TREATMENT:                                                                                                                               09/30/22 Don/doff jacket: 2:41.1.  Pt attempted for 45 seconds  with inability to thread 2nd arm, therefore removed sleeve from arm and switched arms.  OT educated on modified cape technique to facilitate reaching around neck/shoulders to increase success with advancing jacket to thread 2nd arm.  Pt requiring hand over hand to complete, however with massed practice may be able to don jacket with decreased time and effort. Box and blocks: Box and blocks: L: 28 blocks, R: 37 blocks Cup stacking: engaged in stacking cups in pyramid shape with focus on motor control and complete grasp on cup.  OT educating on hand placement on cups to facilitate increased motor control as pt with tendency to spill or drop.  Pt completing stacking and unstacking in single stack and pyramid pattern with min cues for increased grasp on cups.   09/28/22  NA, eval only   PATIENT EDUCATION: Education details: Educated on role and purpose of OT as well as potential interventions and goals for therapy based on initial evaluation findings. Person educated: Patient and Spouse Education method: Explanation Education comprehension: verbalized understanding and needs further education  HOME EXERCISE PROGRAM: TBD  GOALS: Goals reviewed with patient? Yes  SHORT TERM GOALS: Target date: 10/30/22  Pt will perform PD-specific HEP with cueing prn from caregiver  Baseline: Goal status: IN PROGRESS  2.  Pt will improve R hand coordination for ADLs (buttoning/tying shoes) as shown by completing 9-hole peg test in 2 min or less   Baseline: Right: 2.43.9; Left: 3:47.9  Goal status: IN PROGRESS  3.  Pt will improve L hand coordination for ADLs (buttoning/tying shoes) as shown by completing 9-hole peg test in 3 mins or less  Baseline: Right: 2.43.9; Left: 3:47.9  Goal status: IN PROGRESS  4.  Pt/caregiver will verbalize understanding of ways to decr risk of future complcations related to PD (including falls).  Baseline:  Goal status: IN PROGRESS   LONG TERM GOALS: Target date: 11/27/22  Pt/caregiver will verbalize understanding of AE/strategies to increase ease, safety, and independence with ADLs/IADLs  Baseline:  Goal status: IN PROGRESS  2.  Pt will improve functional reaching/coordination for ADLs as shown by improving score on box and blocks test by at least 5 with dominant LUE  Baseline: TBD Goal status: IN PROGRESS  3.  Pt will complete 3 button/unbutton in 4 mins or less with use of AE PRN to demonstrate increased coordination and motor control to manage clothing fasteners. Baseline:  5:16.46  Goal status: IN PROGRESS  4.  Pt will demonstrate improved ease with feeding as evidenced by decreasing PPT#2 (self feeding) by 3 secs Baseline: PPT#2 (simulated eating) 22.93 sec  Goal status: IN PROGRESS  5.  Pt will demonstrate ability to retrieve a lightweight object at moderate range to increase shoulder flexion. Baseline:  Goal status: IN PROGRESS  ASSESSMENT:  CLINICAL IMPRESSION: Pt benefiting from hand over hand with modified cape technique with donning jacket, will benefit from massed practice to increase ease with donning jacket.  Pt demonstrating good motor control and grasp/hand placement with min cues during cup stacking activity.  Pt requires increased time and repetition of cues due to significant HOH.  PERFORMANCE DEFICITS: in functional skills including ADLs, IADLs, coordination, ROM, strength, Fine motor control, Gross motor control, mobility, balance, body mechanics, decreased knowledge of  use of DME, and UE functional use and psychosocial skills including environmental adaptation and routines and behaviors.   IMPAIRMENTS: are limiting patient from ADLs and IADLs.    PLAN:  OT FREQUENCY: 2x/week  OT DURATION: 8 weeks  PLANNED  INTERVENTIONS: self care/ADL training, therapeutic exercise, therapeutic activity, neuromuscular re-education, passive range of motion, balance training, functional mobility training, ultrasound, compression bandaging, moist heat, cryotherapy, patient/family education, cognitive remediation/compensation, psychosocial skills training, energy conservation, coping strategies training, and DME and/or AE instructions  CONSULTED AND AGREED WITH PLAN OF CARE: Patient and family member/caregiver  PLAN FOR NEXT SESSION: Initiate education on large amplitude and full/modified grasp on containers   Mckinnon Glick, OTR/L 09/30/2022, 4:46 PM

## 2022-09-30 NOTE — Therapy (Signed)
OUTPATIENT PHYSICAL THERAPY NEURO TREATMENT  Patient Name: Donald [name] MRN: [id number] DOB:December 29, 1940, 82 y.o., male Today's Date: 10/01/2022   PCP: Gaspar Garbe, MD REFERRING PROVIDER: Vladimir Faster, DO  END OF SESSION:  PT End of Session - 09/30/22 1623     Visit Number 2    Number of Visits 12    Date for PT Re-Evaluation 11/09/22    Authorization Type [employer]    PT Start Time 1620    PT Stop Time 1700    PT Time Calculation (min) 40 min    Activity Tolerance Patient tolerated treatment well    Behavior During Therapy Impulsive             Past Medical History:  Diagnosis Date   Abnormality of gait 08/27/2014   Anxiety    Aortic stenosis    s/p AVR by Dr Laneta Simmers   Arthritis    left wrist- probable- not diagonised   Asthma    in the past   CAD (coronary artery disease)    s/p CABG 2006   GERD (gastroesophageal reflux disease)    H/O seasonal allergies    H/O: rheumatic fever    Hearing deficit    Bilateral hearing aids   Hematuria    had CT scan   Hiatal hernia    History of kidney stones    HOH (hard of hearing)    Hyperlipidemia    Hypertension    Hyperthyroidism    Inguinal hernia    right side; watching at this time per wife's report   Memory difficulty 08/27/2014   Pacemaker    Parkinson's disease    Persistent atrial fibrillation (HCC)    Scarlet fever    as a child   Shortness of breath    Sick sinus syndrome (HCC)    s/p PPM (MDT) 2006   Past Surgical History:  Procedure Laterality Date   AORTIC VALVE REPLACEMENT  2006   CARDIOVERSION N/A 09/14/2013   Procedure: CARDIOVERSION;  Surgeon: Thurmon Fair, MD;  Location: [facility] ENDOSCOPY;  Service: Cardiovascular;  Laterality: N/A;   CHOLECYSTECTOMY N/A 07/21/2013   Procedure: LAPAROSCOPIC CHOLECYSTECTOMY;  Surgeon: Mariella Saa, MD;  Location: [facility] OR;  Service: General;  Laterality: N/A;   COLONOSCOPY W/ POLYPECTOMY     COLONOSCOPY WITH PROPOFOL N/A 08/31/2016    Procedure: COLONOSCOPY WITH PROPOFOL;  Surgeon: Charlott Rakes, MD;  Location: [facility] ENDOSCOPY;  Service: Endoscopy;  Laterality: N/A;   CORONARY ARTERY BYPASS GRAFT  2006   DENTAL SURGERY     implants   INSERT / REPLACE / REMOVE PACEMAKER  2006, 2012   MDT for sick sinus syndrome, gen change 8/12 by [facility]   KIDNEY STONE SURGERY  1984   Patient Active Problem List   Diagnosis Date Noted   HOH (hard of hearing) 12/18/2020   Personal history of colonic polyps 08/31/2016   Abnormality of gait 08/27/2014   Memory difficulty 08/27/2014   Permanent atrial fibrillation (HCC) 09/08/2013   Cholelithiases 07/19/2013   Cholelithiasis 07/19/2013   Encounter for therapeutic drug monitoring 07/03/2013   Mechanical aortic valve 04/05/2013   Parkinson disease 09/27/2012   Orthostatic hypotension 04/01/2012   Pacemaker-Medtronic 03/29/2012   HEPATITIS A 06/04/2010   HYPERTHYROIDISM 06/04/2010   HYPERLIPIDEMIA 06/04/2010   Essential hypertension 06/04/2010   Coronary artery disease involving native coronary artery of native heart without angina pectoris 06/04/2010   SICK SINUS SYNDROME 06/04/2010   HIATAL HERNIA 06/04/2010   SHORTNESS OF BREATH 06/04/2010  CHEST PAIN 06/04/2010    ONSET DATE: 2014  REFERRING DIAG:  G20.B2 (ICD-10-CM) - Parkinson's disease with dyskinesia and fluctuating manifestations    THERAPY DIAG:  Unsteadiness on feet  Abnormal posture  Muscle weakness (generalized)  Other symptoms and signs involving the nervous system  Other abnormalities of gait and mobility  Rationale for Evaluation and Treatment: Rehabilitation  SUBJECTIVE:                                                                                                                                                                                             SUBJECTIVE STATEMENT: Have had 3 falls since I was here last.  Larey Seat while working on the lawnmower. Pt accompanied by: self-spouse in  lobby  PERTINENT HISTORY: (from recent PT screen)  Timed Up and Go test:  29.82 sec with U-step (compared to 21.41 sec at d/c)  10 meter walk test: 24.5 sec = 1.34 ft/sec (compared to 2.25 ft/sec at d/c)  5 time sit to stand test: 12 sec with UE support (compared 19.63 sec)  PAIN:  Are you having pain? No  PRECAUTIONS: Fall, HOH  WEIGHT BEARING RESTRICTIONS: No  FALLS: Has patient fallen in last 6 months? Yes. Number of falls numerous/often multiple per day  LIVING ENVIRONMENT: Lives with: lives with their spouse Lives in: House/apartment Stairs:  reports ramp access in/out Has following equipment at home: Environmental consultant - 4 wheeled and U-step  PLOF: Requires assistive device for independence and Needs assistance with homemaking  PATIENT GOALS: Improve balance and walking  OBJECTIVE:    TODAY'S TREATMENT: 09/30/2022 Activity Comments  Seated march 2 x 10 reps Cues for SLOWED pace  Sit to stand, 2 x 5 reps Cues for increased forward lean, upright posture  Seated ankle pumps 2 x 10 reps Cues for SLOWED pace  Standing March 2 x 10 at locked walker Cues for slowed pace, increased height of march  Standing forward kick 2 x 10 Cues for slowed pace  Gait training 40 ft x 2 , then 60 ft with U-step walker, with PT supervision and cues for SLOWED pace, like "SLOW MOTION" Cues to "stop, heels to floor, stick out chest" to start again       Pt performs PWR! Moves in seated position 2 x 10 reps   PWR! Up for improved posture  PWR! Rock for improved weighshifting  PWR! Twist for improved trunk rotation   PWR! Step for improved step initiation -using obstacle to step over  Cues provided for slow, sustained movement patterns  PATIENT EDUCATION: Education details: SLOWED pace with gait, ways that wife can cue patient "  stick out chest, heels to ground, long strides like slow motion" Person educated: Patient and Spouse, at end of session Education method: Explanation, Demonstration, and  Verbal cues Education comprehension: verbalized understanding, returned demonstration, and needs further education  ----------------------------------------------------------------------------- Objective measures below taken at initial evaluation:  DIAGNOSTIC FINDINGS:   COGNITION: Overall cognitive status: No family/caregiver present to determine baseline cognitive functioning, HOH makes this determination more difficult   SENSATION: WFL  COORDINATION: Grossly impaired    MUSCLE TONE: ratcheting in BLE  MUSCLE LENGTH: Hamstrings: Right -20 deg; Left -20 deg     POSTURE: rounded shoulders and forward head  LOWER EXTREMITY ROM:     Active  Right Eval Left Eval  Hip flexion    Hip extension    Hip abduction    Hip adduction    Hip internal rotation    Hip external rotation    Knee flexion -10 -10  Knee extension    Ankle dorsiflexion 8 11  Ankle plantarflexion    Ankle inversion    Ankle eversion     (Blank rows = not tested)  LOWER EXTREMITY MMT:    Grossly 4/5 BLE  BED MOBILITY:  NT  TRANSFERS: Assistive device utilized: Environmental consultant - 4 wheeled  Sit to stand: Modified independence and CGA Stand to sit: Modified independence and SBA Chair to chair: Modified independence and CGA Floor:  NT  RAMP:  NT  CURB:  Level of Assistance: CGA Assistive device utilized: Environmental consultant - 4 wheeled Curb Comments:   STAIRS: NT  GAIT: Gait pattern: shuffling and festinating Distance walked:  Assistive device utilized:  U-step Level of assistance: CGA and Min A Comments:   FUNCTIONAL TESTS:  5 times sit to stand: 33 sec w/ retro-LOB, pushing backs of knees to chair, multiple attempts needed Timed up and go (TUG): 23 sec. TUG w/ cog: 32.91 sec 10 meter walk test: 18.12 sec = 1.8 ft/sec  Mini-BESTest: 7/28      PATIENT EDUCATION: Education details: assessment findings Person educated: Patient Education method: Explanation Education comprehension: verbalized  understanding  HOME EXERCISE PROGRAM: TBD  GOALS: Goals reviewed with patient? Yes  SHORT TERM GOALS: Target date: 10/19/2022    Patient will perform HEP with family/caregiver supervision for improved strength, balance, transfers, and gait  Baseline: Goal status: IN PROGRESS  2.  Manifest improved balance and BLE strength per 20 sec 5xSTS test without compensatory movements Baseline: 33 sec w/ compensations Goal status: IN PROGRESS    LONG TERM GOALS: Target date: 11/09/2022    Reduce risk for falls per score 18/28 Mini-BESTest Baseline: 7/28 Goal status: IN PROGRESS  2.  Demo improved efficiency and safety with ambulation maintaining speed of 2.25 ft/sec w/ least restrictive AD to improve community ambulation Baseline: 1.8 ft/sec Goal status: IN PROGRESS  3.  Demo improved safety with gait and negotiating turns per time of 15 sec TUG test Baseline: 23 sec w/ U-step Goal status: IN PROGRESS    ASSESSMENT:  CLINICAL IMPRESSION: Skilled PT session today initiated seated and standing exercise to address slowed pace and larger amplitude movement patterns.  Pt responds well with max cues.  Wife in lobby and PT provided education at end of session, with wife reporting that "he always does better in therapy with you than with me at home."  PT discussed simple cues that wife can provide (see above) to try slowed pace of gait with decreased shuffling.  He will likely need additional practice, repetition, and HEP formally initiated to address.  OBJECTIVE IMPAIRMENTS: Abnormal gait, decreased activity tolerance, decreased balance, decreased coordination, decreased endurance, decreased mobility, difficulty walking, decreased ROM, decreased strength, impaired flexibility, impaired tone, improper body mechanics, and postural dysfunction.   ACTIVITY LIMITATIONS: carrying, lifting, bending, standing, transfers, reach over head, and locomotion level  PARTICIPATION LIMITATIONS: meal prep,  cleaning, laundry, driving, shopping, community activity, and yard work  PERSONAL FACTORS: Age, Time since onset of injury/illness/exacerbation, and 3+ comorbidities: PMH  are also affecting patient's functional outcome.   REHAB POTENTIAL: Good  CLINICAL DECISION MAKING: Evolving/moderate complexity  EVALUATION COMPLEXITY: Moderate  PLAN:  PT FREQUENCY: 1-2x/week  PT DURATION: 6 weeks  PLANNED INTERVENTIONS: Therapeutic exercises, Therapeutic activity, Neuromuscular re-education, Balance training, Gait training, Patient/Family education, Self Care, Joint mobilization, Stair training, Vestibular training, Canalith repositioning, DME instructions, Aquatic Therapy, Dry Needling, Electrical stimulation, Spinal mobilization, Cryotherapy, Moist heat, Taping, Traction, Ultrasound, Ionotophoresis 4mg /ml Dexamethasone, and Manual therapy  PLAN FOR NEXT SESSION: seated PWR moves, seated exercises, standing march, kicks with cues for SLOWED motion   Lonia Blood, PT 10/01/22 4:05 PM Phone: [phone] Fax: [phone]  [facility] [address], [address] [place], [place] [address] Phone # [phone] Fax # [phone]

## 2022-09-30 NOTE — Therapy (Signed)
OUTPATIENT SPEECH LANGUAGE PATHOLOGY TREATMENT   Patient Name: Donald [name] MRN: [id number] DOB:09/05/1940, 82 y.o., male Today's Date: 09/30/2022  PCP: Gaspar Garbe, MD REFERRING PROVIDER: Vladimir Faster, DO  END OF SESSION:  End of Session - 09/30/22 1829     Visit Number 2    Number of Visits 17    Date for SLP Re-Evaluation 11/23/22    Authorization Type HTA    SLP Start Time 1534    SLP Stop Time  1615    SLP Time Calculation (min) 41 min    Activity Tolerance Other (comment)   limited somewhat by level of hearing acuity             Past Medical History:  Diagnosis Date   Abnormality of gait 08/27/2014   Anxiety    Aortic stenosis    s/p AVR by Dr Laneta Simmers   Arthritis    left wrist- probable- not diagonised   Asthma    in the past   CAD (coronary artery disease)    s/p CABG 2006   GERD (gastroesophageal reflux disease)    H/O seasonal allergies    H/O: rheumatic fever    Hearing deficit    Bilateral hearing aids   Hematuria    had CT scan   Hiatal hernia    History of kidney stones    HOH (hard of hearing)    Hyperlipidemia    Hypertension    Hyperthyroidism    Inguinal hernia    right side; watching at this time per wife's report   Memory difficulty 08/27/2014   Pacemaker    Parkinson's disease    Persistent atrial fibrillation (HCC)    Scarlet fever    as a child   Shortness of breath    Sick sinus syndrome (HCC)    s/p PPM (MDT) 2006   Past Surgical History:  Procedure Laterality Date   AORTIC VALVE REPLACEMENT  2006   CARDIOVERSION N/A 09/14/2013   Procedure: CARDIOVERSION;  Surgeon: Thurmon Fair, MD;  Location: [facility];  Service: Cardiovascular;  Laterality: N/A;   CHOLECYSTECTOMY N/A 07/21/2013   Procedure: LAPAROSCOPIC CHOLECYSTECTOMY;  Surgeon: Mariella Saa, MD;  Location: [facility] OR;  Service: General;  Laterality: N/A;   COLONOSCOPY W/ POLYPECTOMY     COLONOSCOPY WITH PROPOFOL N/A 08/31/2016   Procedure: COLONOSCOPY  WITH PROPOFOL;  Surgeon: Charlott Rakes, MD;  Location: [facility]  ENDOSCOPY;  Service: Endoscopy;  Laterality: N/A;   CORONARY ARTERY BYPASS GRAFT  2006   DENTAL SURGERY     implants   INSERT / REPLACE / REMOVE PACEMAKER  2006, 2012   MDT for sick sinus syndrome, gen change 8/12 by [facility]   KIDNEY STONE SURGERY  1984   Patient Active Problem List   Diagnosis Date Noted   HOH (hard of hearing) 12/18/2020   Personal history of colonic polyps 08/31/2016   Abnormality of gait 08/27/2014   Memory difficulty 08/27/2014   Permanent atrial fibrillation (HCC) 09/08/2013   Cholelithiases 07/19/2013   Cholelithiasis 07/19/2013   Encounter for therapeutic drug monitoring 07/03/2013   Mechanical aortic valve 04/05/2013   Parkinson disease 09/27/2012   Orthostatic hypotension 04/01/2012   Pacemaker-Medtronic 03/29/2012   HEPATITIS A 06/04/2010   HYPERTHYROIDISM 06/04/2010   HYPERLIPIDEMIA 06/04/2010   Essential hypertension 06/04/2010   Coronary artery disease involving native coronary artery of native heart without angina pectoris 06/04/2010   SICK SINUS SYNDROME 06/04/2010   HIATAL HERNIA 06/04/2010   SHORTNESS OF BREATH 06/04/2010  CHEST PAIN 06/04/2010    ONSET DATE: referred 09/01/22  REFERRING DIAG: G20.B2 (ICD-10-CM) - Parkinson's disease with dyskinesia and fluctuating manifestations   THERAPY DIAG:  Dysarthria and anarthria  Cognitive communication deficit  Dysphagia, oropharyngeal phase  Rationale for Evaluation and Treatment: Rehabilitation  SUBJECTIVE:   SUBJECTIVE STATEMENT: "How many times you want me to do them?" (Pt, re: loud /a/) Pt accompanied by: significant other (in lobby but available to provide SLP with information)   PERTINENT HISTORY: Dx wtih PD 2012. MBSS in July 2020 revealed moderate dysphagia with significant pharyngeal residue, silent aspiration, reduce tongue base, epiglittic inversion, pharyngeal contraction, hocking and drool. Prior ST course 2022 at [facility].   PAIN:  Are you having pain? No  FALLS: Has patient fallen in last 6 months?  See PT evaluation for details   PATIENT GOALS: swallow and talk better  OBJECTIVE:   DIAGNOSTIC FINDINGS: MBSS 2022 Clinical Impression Patient presents with mildly worsening moderate oropharyngeal dysphagia c/b sensorimotor deficits.  Oral control and strength impaired allowing premature spillage of liquid into pharynx.    Pharyngeal dysphagia is worse than 2020 study - Pharyngeal contraction, tongue base retraction, epiglottic deflection and airway closure are compromised resulting in variable pharyngeal retention (vallecular more than pyriform and worse with increased viscosity) without awareness.  Pharyngeal secretions present and mixed with barium at pyriform sinus.  With puree - 30% of bolus transited into esophagus with first swallow, dys3- severe pharyngeal retention noted.  Pt senses very large amounts of retention but not mod and mild amounts. Dry swallows not effective to decrease retention - however liquid swallows helpful to decrease but not fully clear.  Thin and nectar aspirated due to decreased adequacy of laryngeal closure, from penetrates transiting below vocal cords and also from pharyngeal retention spilling into airway post-swallow.     CLINICAL SWALLOW ASSESSMENT:   Current diet: regular & thin Dentition: poor condition Patient directly observed with POs: Yes: thin liquids  Feeding: able to feed self Liquids provided by: straw Oral phase signs and symptoms: none Pharyngeal phase signs and symptoms: immediate cough, all trials Comments: dysphonia present throughout evaluation, wet vocal quality does not clear with cued throat clear and reswallow  PATIENT REPORTED OUTCOME MEASURES (PROM): PROM was completed today (CES) with a score of 13/32. Higher scores indicate greater effectiveness of communication.   TODAY'S TREATMENT:                                                                                                                                          DATE:  09/30/22: Pt wife waiting on a call back from scheduler for MBSS. SLP required to provide a very loud speaking volume for pt to respond, or ask SLP for repeat. [name] stated pt had hearing aids serviced "about a month ago." SLP may suggest return to hearing aid dispenser for service/checkup. Max cues needed for loud /  a/ including modeling. Pt average loudness upper 80s dB with average 6 seconds, even with max cues for longer production. SLP told pt 10 reps, BID. SLP educated pt/wife that pt and wife should generate 10 everyday sentences to practice at home BID after the loud /a/ reps. When SLP inquired how consistent pt is with performing other HEPs in the past [name] stated, "He isn't too good with doing (the HEP) at home." SLP reiterated that Wigen must do HEP at least 5 days/week for there to be any possibility of progress with speech intelligibility and/or with swallow strength.SLP provided pt/wife with HEP checklist for completion with "ahhhhhh" and "everyday sentences" slots, to encourage HEP completion. At the end of session wife provided Goetze with a sip of water and pt immediately coughed after an audible swallow.  09/28/22: Initiated training on dysarthria strategies: slow, loud, over-articulate, with practice at phrase reading and generative levels. Direct model required to optimize production and improve intelligibility. Education on POC and goals, to which pt and spouse verbalize agreement.   PATIENT EDUCATION: Education details: see above Person educated: Patient and Spouse Education method: Explanation, Demonstration, Verbal cues, and Handouts Education comprehension: verbalized understanding, returned demonstration, verbal cues required, and needs further education  HOME EXERCISE PROGRAM: Sustained "ah," oral reading   GOALS: Goals reviewed with patient? Yes  SHORT TERM GOALS: Target date:  10/26/2022  Pt will participate in MBSS Baseline: Goal status: INITIAL  2.  Pt will demonstrate use of swallow compensations with use of visual aid and occasional verbal cues during structured practice Baseline:  Goal status: INITIAL  3.  Pt will use dysarthria strategies, resulting in intelligible production of 15/20 sentences  Baseline:  Goal status: INITIAL  4.  Pt will demonstrate dysphagia exercises with mod-A over 2 sessions  Baseline:  Goal status: INITIAL   LONG TERM GOALS: Target date: 11/23/2022  Pt will complete dysphagia and dysarthria HEP 5/7 days over 1 week period Baseline:  Goal status: INITIAL  2.  Pt and spouse will report carryover of swallow compensations and strategies at home Baseline:  Goal status: INITIAL  3.  Pt will use dysarthria strategies during 10 minute conversation, resulting in 80% intelligibility  Baseline:  Goal status: INITIAL  ASSESSMENT:  CLINICAL IMPRESSION: Patient is a 82 y.o. M who was seen today for dysphagia and motor speech evaluation 2/2 PD. Evaluation reveals moderate dysarthria, c/b low vocal intensity and imprecise articulation which results in significantly decreased intelligibility. Pt would benefit from skilled ST to address aforementioned deficits to reduce caregiver burden , enhance communication efficacy, and facilitate improved swallow function/safety .   OBJECTIVE IMPAIRMENTS: Objective impairments include attention, memory, executive functioning, dysarthria, and dysphagia. These impairments are limiting patient from effectively communicating at home and in community and safety when swallowing.Factors affecting potential to achieve goals and functional outcome are ability to learn/carryover information and co-morbidities.. Patient will benefit from skilled SLP services to address above impairments and improve overall function.  REHAB POTENTIAL: Good  PLAN:  SLP FREQUENCY: 2x/week  SLP DURATION: 8 weeks  PLANNED  INTERVENTIONS: Aspiration precaution training, Pharyngeal strengthening exercises, Diet toleration management , Environmental controls, Trials of upgraded texture/liquids, Cueing hierachy, Internal/external aids, Functional tasks, Multimodal communication approach, SLP instruction and feedback, Compensatory strategies, Patient/family education, and Re-evaluation    Yuliet Needs, CCC-SLP 09/30/2022, 6:31 PM

## 2022-10-02 ENCOUNTER — Telehealth: Payer: Self-pay | Admitting: Neurology

## 2022-10-02 ENCOUNTER — Telehealth: Payer: Self-pay | Admitting: Anesthesiology

## 2022-10-02 ENCOUNTER — Other Ambulatory Visit: Payer: Self-pay

## 2022-10-02 ENCOUNTER — Ambulatory Visit
Admission: RE | Admit: 2022-10-02 | Discharge: 2022-10-02 | Disposition: A | Discharge status: Home / Self Care | Payer: PPO | Source: Ambulatory Visit | Attending: Neurology | Admitting: Neurology

## 2022-10-02 ENCOUNTER — Ambulatory Visit (INDEPENDENT_AMBULATORY_CARE_PROVIDER_SITE_OTHER): Payer: PPO | Admitting: Neurology

## 2022-10-02 VITALS — BP 110/52 | HR 76 | Ht 68.0 in | Wt 140.0 lb

## 2022-10-02 DIAGNOSIS — G903 Multi-system degeneration of the autonomic nervous system: Secondary | ICD-10-CM

## 2022-10-02 DIAGNOSIS — S0990XA Unspecified injury of head, initial encounter: Secondary | ICD-10-CM | POA: Diagnosis not present

## 2022-10-02 DIAGNOSIS — G20A2 Parkinson's disease without dyskinesia, with fluctuations: Secondary | ICD-10-CM

## 2022-10-02 DIAGNOSIS — W19XXXA Unspecified fall, initial encounter: Secondary | ICD-10-CM | POA: Diagnosis not present

## 2022-10-02 DIAGNOSIS — R270 Ataxia, unspecified: Secondary | ICD-10-CM

## 2022-10-02 DIAGNOSIS — K117 Disturbances of salivary secretion: Secondary | ICD-10-CM | POA: Diagnosis not present

## 2022-10-02 NOTE — Telephone Encounter (Signed)
Donald [name] from [facility] left message with AN stating she needs to report radiology stat results. Per  Nurse- Stat CT Head results- Hypodensity and loss of gray/white differential R occipital lobe concerning for acute to subacute infarct. Recommend MRI or if not a candidate for MRI, CTA head/neck. No intracranial hemorrhage.  Call back number is [phone].  Complete AN nurse message in Drs box.

## 2022-10-02 NOTE — Telephone Encounter (Signed)
Pt's wife called in stating we were wanting to know when his pacemaker was put in the last time. She stated August of 2012.

## 2022-10-02 NOTE — Telephone Encounter (Signed)
Radiology report from CT brain just came back as:  1. Hypodensity and loss of gray-white differentiation in the right occipital lobe, concerning for acute to subacute infarct. MRI is recommended for further evaluation. If the patient's pacemaker is not MRI conditional, a CTA head and neck is recommended. 2. No acute intracranial hemorrhage.  Note that they stated that they would call results but haven't been as of yet.  Nonetheless, Chelsea, see if patients PPM is MRI compatable.  If so, order MRI brain.  If not, order CTA head/neck.  Let pt/wife know that there was no bleeding from fall.  Radiologist was concerned there could be newer stroke.  I didn't see clinical evidence of this on my examination today but if he gets worse or develops new sx's, should go to the ER.  Chelsea, get scans done quickly - by beginning of the week please.

## 2022-10-02 NOTE — Patient Instructions (Signed)
[facility] has been placed for your Stat CT   They are located at [address] [place]. Please contact them directly by calling [phone]

## 2022-10-02 NOTE — Telephone Encounter (Signed)
Dr Tat has already addressed this 2 hours ago.

## 2022-10-05 ENCOUNTER — Ambulatory Visit: Payer: PPO

## 2022-10-05 ENCOUNTER — Other Ambulatory Visit: Payer: Self-pay

## 2022-10-05 ENCOUNTER — Telehealth: Payer: Self-pay

## 2022-10-05 ENCOUNTER — Ambulatory Visit: Payer: PPO | Admitting: Occupational Therapy

## 2022-10-05 DIAGNOSIS — R2689 Other abnormalities of gait and mobility: Secondary | ICD-10-CM

## 2022-10-05 DIAGNOSIS — R29818 Other symptoms and signs involving the nervous system: Secondary | ICD-10-CM | POA: Diagnosis not present

## 2022-10-05 DIAGNOSIS — W19XXXA Unspecified fall, initial encounter: Secondary | ICD-10-CM

## 2022-10-05 DIAGNOSIS — R293 Abnormal posture: Secondary | ICD-10-CM

## 2022-10-05 DIAGNOSIS — R1312 Dysphagia, oropharyngeal phase: Secondary | ICD-10-CM

## 2022-10-05 DIAGNOSIS — R41841 Cognitive communication deficit: Secondary | ICD-10-CM

## 2022-10-05 DIAGNOSIS — R278 Other lack of coordination: Secondary | ICD-10-CM

## 2022-10-05 DIAGNOSIS — M6281 Muscle weakness (generalized): Secondary | ICD-10-CM

## 2022-10-05 DIAGNOSIS — R2681 Unsteadiness on feet: Secondary | ICD-10-CM

## 2022-10-05 DIAGNOSIS — R471 Dysarthria and anarthria: Secondary | ICD-10-CM

## 2022-10-05 DIAGNOSIS — Z9181 History of falling: Secondary | ICD-10-CM

## 2022-10-05 DIAGNOSIS — R270 Ataxia, unspecified: Secondary | ICD-10-CM

## 2022-10-05 DIAGNOSIS — G20B2 Parkinson's disease with dyskinesia, with fluctuations: Secondary | ICD-10-CM

## 2022-10-05 MED ORDER — DIPHENHYDRAMINE HCL 50 MG PO TABS: 50.0000 mg | ORAL_TABLET | Freq: Once | ORAL | 0 refills | Status: DC

## 2022-10-05 NOTE — Therapy (Signed)
OUTPATIENT PHYSICAL THERAPY NEURO TREATMENT  Patient Name: Donald [name] MRN: [id number] DOB:Oct 23, 1940, 82 y.o., male Today's Date: 10/05/2022   PCP: Gaspar Garbe, MD REFERRING PROVIDER: Vladimir Faster, DO  END OF SESSION:  PT End of Session - 10/05/22 1452     Visit Number 3    Number of Visits 12    Date for PT Re-Evaluation 11/09/22    Authorization Type [employer]    PT Start Time 1445    PT Stop Time 1530    PT Time Calculation (min) 45 min    Activity Tolerance Patient tolerated treatment well    Behavior During Therapy Impulsive             Past Medical History:  Diagnosis Date   Abnormality of gait 08/27/2014   Anxiety    Aortic stenosis    s/p AVR by Dr Laneta Simmers   Arthritis    left wrist- probable- not diagonised   Asthma    in the past   CAD (coronary artery disease)    s/p CABG 2006   GERD (gastroesophageal reflux disease)    H/O seasonal allergies    H/O: rheumatic fever    Hearing deficit    Bilateral hearing aids   Hematuria    had CT scan   Hiatal hernia    History of kidney stones    HOH (hard of hearing)    Hyperlipidemia    Hypertension    Hyperthyroidism    Inguinal hernia    right side; watching at this time per wife's report   Memory difficulty 08/27/2014   Pacemaker    Parkinson's disease    Persistent atrial fibrillation (HCC)    Scarlet fever    as a child   Shortness of breath    Sick sinus syndrome (HCC)    s/p PPM (MDT) 2006   Past Surgical History:  Procedure Laterality Date   AORTIC VALVE REPLACEMENT  2006   CARDIOVERSION N/A 09/14/2013   Procedure: CARDIOVERSION;  Surgeon: Thurmon Fair, MD;  Location: [facility] ENDOSCOPY;  Service: Cardiovascular;  Laterality: N/A;   CHOLECYSTECTOMY N/A 07/21/2013   Procedure: LAPAROSCOPIC CHOLECYSTECTOMY;  Surgeon: Mariella Saa, MD;  Location: [facility] OR;  Service: General;  Laterality: N/A;   COLONOSCOPY W/ POLYPECTOMY     COLONOSCOPY WITH PROPOFOL N/A 08/31/2016    Procedure: COLONOSCOPY WITH PROPOFOL;  Surgeon: Charlott Rakes, MD;  Location: [facility] ENDOSCOPY;  Service: Endoscopy;  Laterality: N/A;   CORONARY ARTERY BYPASS GRAFT  2006   DENTAL SURGERY     implants   INSERT / REPLACE / REMOVE PACEMAKER  2006, 2012   MDT for sick sinus syndrome, gen change 8/12 by [facility]   KIDNEY STONE SURGERY  1984   Patient Active Problem List   Diagnosis Date Noted   HOH (hard of hearing) 12/18/2020   Personal history of colonic polyps 08/31/2016   Abnormality of gait 08/27/2014   Memory difficulty 08/27/2014   Permanent atrial fibrillation (HCC) 09/08/2013   Cholelithiases 07/19/2013   Cholelithiasis 07/19/2013   Encounter for therapeutic drug monitoring 07/03/2013   Mechanical aortic valve 04/05/2013   Parkinson disease 09/27/2012   Orthostatic hypotension 04/01/2012   Pacemaker-Medtronic 03/29/2012   HEPATITIS A 06/04/2010   HYPERTHYROIDISM 06/04/2010   HYPERLIPIDEMIA 06/04/2010   Essential hypertension 06/04/2010   Coronary artery disease involving native coronary artery of native heart without angina pectoris 06/04/2010   SICK SINUS SYNDROME 06/04/2010   HIATAL HERNIA 06/04/2010   SHORTNESS OF BREATH 06/04/2010  CHEST PAIN 06/04/2010    ONSET DATE: 2014  REFERRING DIAG:  G20.B2 (ICD-10-CM) - Parkinson's disease with dyskinesia and fluctuating manifestations    THERAPY DIAG:  Other symptoms and signs involving the nervous system  Muscle weakness (generalized)  Abnormal posture  Unsteadiness on feet  Other abnormalities of gait and mobility  Parkinson's disease with dyskinesia and fluctuating manifestations  History of falling  Rationale for Evaluation and Treatment: Rehabilitation  SUBJECTIVE:                                                                                                                                                                                             SUBJECTIVE STATEMENT: Continuing to experience falls  daily  Pt accompanied by: self-spouse in lobby  PERTINENT HISTORY: (from recent PT screen)  Timed Up and Go test:  29.82 sec with U-step (compared to 21.41 sec at d/c)  10 meter walk test: 24.5 sec = 1.34 ft/sec (compared to 2.25 ft/sec at d/c)  5 time sit to stand test: 12 sec with UE support (compared 19.63 sec)  PAIN:  Are you having pain? No  PRECAUTIONS: Fall, HOH  WEIGHT BEARING RESTRICTIONS: No  FALLS: Has patient fallen in last 6 months? Yes. Number of falls numerous/often multiple per day  LIVING ENVIRONMENT: Lives with: lives with their spouse Lives in: House/apartment Stairs:  reports ramp access in/out Has following equipment at home: Walker - 4 wheeled and U-step  PLOF: Requires assistive device for independence and Needs assistance with homemaking  PATIENT GOALS: Improve balance and walking  OBJECTIVE:   TODAY'S TREATMENT: 10/05/22 Activity Comments  NU-step x 8 min Resistance intervals for coordination and heavy work. 30 sec on 60 sec off  Transfer training Verbal/visual cues in safety/sequence and UE placement  Alt stair taps x 2 min 5#, 6" box parallel bars  LAQ 3x10 5#, cues in full ROM  Sidestepping x 2 min Parallel bars 5#, cues in large step length  Standing hip abd 2x10 -unilat step to color dot on floor for visual cue in sequence  Step length/advancement 2x10 -forward/backward single leg to colored dot  Gait training CGA w/ U-step to generalize incr step length        PATIENT EDUCATION: Education details: SLOWED pace with gait, ways that wife can cue patient "stick out chest, heels to ground, long strides like slow motion" Person educated: Patient and Spouse, at end of session Education method: Explanation, Demonstration, and Verbal cues Education comprehension: verbalized understanding, returned demonstration, and needs further education  ----------------------------------------------------------------------------- Objective measures below  taken at initial evaluation:  DIAGNOSTIC FINDINGS:   COGNITION: Overall cognitive  status: No family/caregiver present to determine baseline cognitive functioning, HOH makes this determination more difficult   SENSATION: WFL  COORDINATION: Grossly impaired    MUSCLE TONE: ratcheting in BLE  MUSCLE LENGTH: Hamstrings: Right -20 deg; Left -20 deg     POSTURE: rounded shoulders and forward head  LOWER EXTREMITY ROM:     Active  Right Eval Left Eval  Hip flexion    Hip extension    Hip abduction    Hip adduction    Hip internal rotation    Hip external rotation    Knee flexion -10 -10  Knee extension    Ankle dorsiflexion 8 11  Ankle plantarflexion    Ankle inversion    Ankle eversion     (Blank rows = not tested)  LOWER EXTREMITY MMT:    Grossly 4/5 BLE  BED MOBILITY:  NT  TRANSFERS: Assistive device utilized: Environmental consultant - 4 wheeled  Sit to stand: Modified independence and CGA Stand to sit: Modified independence and SBA Chair to chair: Modified independence and CGA Floor:  NT  RAMP:  NT  CURB:  Level of Assistance: CGA Assistive device utilized: Environmental consultant - 4 wheeled Curb Comments:   STAIRS: NT  GAIT: Gait pattern: shuffling and festinating Distance walked:  Assistive device utilized:  U-step Level of assistance: CGA and Min A Comments:   FUNCTIONAL TESTS:  5 times sit to stand: 33 sec w/ retro-LOB, pushing backs of knees to chair, multiple attempts needed Timed up and go (TUG): 23 sec. TUG w/ cog: 32.91 sec 10 meter walk test: 18.12 sec = 1.8 ft/sec  Mini-BESTest: 7/28      PATIENT EDUCATION: Education details: assessment findings Person educated: Patient Education method: Explanation Education comprehension: verbalized understanding  HOME EXERCISE PROGRAM: TBD  GOALS: Goals reviewed with patient? Yes  SHORT TERM GOALS: Target date: 10/19/2022    Patient will perform HEP with family/caregiver supervision for improved strength,  balance, transfers, and gait  Baseline: Goal status: IN PROGRESS  2.  Manifest improved balance and BLE strength per 20 sec 5xSTS test without compensatory movements Baseline: 33 sec w/ compensations Goal status: IN PROGRESS    LONG TERM GOALS: Target date: 11/09/2022    Reduce risk for falls per score 18/28 Mini-BESTest Baseline: 7/28 Goal status: IN PROGRESS  2.  Demo improved efficiency and safety with ambulation maintaining speed of 2.25 ft/sec w/ least restrictive AD to improve community ambulation Baseline: 1.8 ft/sec Goal status: IN PROGRESS  3.  Demo improved safety with gait and negotiating turns per time of 15 sec TUG test Baseline: 23 sec w/ U-step Goal status: IN PROGRESS    ASSESSMENT:  CLINICAL IMPRESSION: Continued with training in improving coordination, planning, control for enhanced balance and gait mechanics.  Heavy use of visual cues for foot placement for large amplitude step length with good return demonstration provided visual cues, but unable to generalize to gait performance with verbal cues.  Attempted use of "get your right foot under the walker" to combat his festination and step-to pattern but with only 10% carryover   OBJECTIVE IMPAIRMENTS: Abnormal gait, decreased activity tolerance, decreased balance, decreased coordination, decreased endurance, decreased mobility, difficulty walking, decreased ROM, decreased strength, impaired flexibility, impaired tone, improper body mechanics, and postural dysfunction.   ACTIVITY LIMITATIONS: carrying, lifting, bending, standing, transfers, reach over head, and locomotion level  PARTICIPATION LIMITATIONS: meal prep, cleaning, laundry, driving, shopping, community activity, and yard work  PERSONAL FACTORS: Age, Time since onset of injury/illness/exacerbation, and 3+ comorbidities: PMH  are also affecting patient's functional outcome.   REHAB POTENTIAL: Good  CLINICAL DECISION MAKING: Evolving/moderate  complexity  EVALUATION COMPLEXITY: Moderate  PLAN:  PT FREQUENCY: 1-2x/week  PT DURATION: 6 weeks  PLANNED INTERVENTIONS: Therapeutic exercises, Therapeutic activity, Neuromuscular re-education, Balance training, Gait training, Patient/Family education, Self Care, Joint mobilization, Stair training, Vestibular training, Canalith repositioning, DME instructions, Aquatic Therapy, Dry Needling, Electrical stimulation, Spinal mobilization, Cryotherapy, Moist heat, Taping, Traction, Ultrasound, Ionotophoresis 4mg /ml Dexamethasone, and Manual therapy  PLAN FOR NEXT SESSION: seated PWR moves, seated exercises, standing march, kicks with cues for SLOWED motion   5:08 PM, 10/05/22 M. Shary Decamp, PT, DPT Physical Therapist- [place] Office Number: [phone]

## 2022-10-05 NOTE — Therapy (Signed)
OUTPATIENT OCCUPATIONAL THERAPY [facility]  Treatment Note  Patient Name: Donald [name] MRN: [id number] DOB:13-May-1941, 82 y.o., male Today's Date: 10/05/2022  PCP: Gaspar Garbe, MD REFERRING PROVIDER: Vladimir Faster, DO  END OF SESSION:  OT End of Session - 10/05/22 1337     Visit Number 3    Number of Visits 17    Date for OT Re-Evaluation 11/27/22    Authorization Type [employer]    OT Start Time 1318    OT Stop Time 1400    OT Time Calculation (min) 42 min               Past Medical History:  Diagnosis Date   Abnormality of gait 08/27/2014   Anxiety    Aortic stenosis    s/p AVR by Dr Laneta Simmers   Arthritis    left wrist- probable- not diagonised   Asthma    in the past   CAD (coronary artery disease)    s/p CABG 2006   GERD (gastroesophageal reflux disease)    H/O seasonal allergies    H/O: rheumatic fever    Hearing deficit    Bilateral hearing aids   Hematuria    had CT scan   Hiatal hernia    History of kidney stones    HOH (hard of hearing)    Hyperlipidemia    Hypertension    Hyperthyroidism    Inguinal hernia    right side; watching at this time per wife's report   Memory difficulty 08/27/2014   Pacemaker    Parkinson's disease    Persistent atrial fibrillation (HCC)    Scarlet fever    as a child   Shortness of breath    Sick sinus syndrome (HCC)    s/p PPM (MDT) 2006   Past Surgical History:  Procedure Laterality Date   AORTIC VALVE REPLACEMENT  2006   CARDIOVERSION N/A 09/14/2013   Procedure: CARDIOVERSION;  Surgeon: Thurmon Fair, MD;  Location: [facility] ENDOSCOPY;  Service: Cardiovascular;  Laterality: N/A;   CHOLECYSTECTOMY N/A 07/21/2013   Procedure: LAPAROSCOPIC CHOLECYSTECTOMY;  Surgeon: Mariella Saa, MD;  Location: [facility] OR;  Service: General;  Laterality: N/A;   COLONOSCOPY W/ POLYPECTOMY     COLONOSCOPY WITH PROPOFOL N/A 08/31/2016   Procedure: COLONOSCOPY WITH PROPOFOL;  Surgeon: Charlott Rakes, MD;  Location:  [facility] ENDOSCOPY;  Service: Endoscopy;  Laterality: N/A;   CORONARY ARTERY BYPASS GRAFT  2006   DENTAL SURGERY     implants   INSERT / REPLACE / REMOVE PACEMAKER  2006, 2012   MDT for sick sinus syndrome, gen change 8/12 by [facility]   KIDNEY STONE SURGERY  1984   Patient Active Problem List   Diagnosis Date Noted   HOH (hard of hearing) 12/18/2020   Personal history of colonic polyps 08/31/2016   Abnormality of gait 08/27/2014   Memory difficulty 08/27/2014   Permanent atrial fibrillation (HCC) 09/08/2013   Cholelithiases 07/19/2013   Cholelithiasis 07/19/2013   Encounter for therapeutic drug monitoring 07/03/2013   Mechanical aortic valve 04/05/2013   Parkinson disease 09/27/2012   Orthostatic hypotension 04/01/2012   Pacemaker-Medtronic 03/29/2012   HEPATITIS A 06/04/2010   HYPERTHYROIDISM 06/04/2010   HYPERLIPIDEMIA 06/04/2010   Essential hypertension 06/04/2010   Coronary artery disease involving native coronary artery of native heart without angina pectoris 06/04/2010   SICK SINUS SYNDROME 06/04/2010   HIATAL HERNIA 06/04/2010   SHORTNESS OF BREATH 06/04/2010   CHEST PAIN 06/04/2010    ONSET DATE: referral date 09/01/22 (  PD diagnosis 2014)  REFERRING DIAG: G20.B2 (ICD-10-CM) - Parkinson's disease with dyskinesia and fluctuating manifestations  THERAPY DIAG:  Other symptoms and signs involving the nervous system  Muscle weakness (generalized)  Other lack of coordination  Abnormal posture  Rationale for Evaluation and Treatment: Rehabilitation  SUBJECTIVE:   SUBJECTIVE STATEMENT: Pt reports falling in to the wall (leaving him with black eye). Pt accompanied by: self and significant other  PERTINENT HISTORY: Parkinson's Disease. PMH: hx of falls (wears helment), arthritis, asthma, CAD, GERD, HOH, HLD, HTN, hyperthyroidism, anxiety, aortic stenosis, hx of aortic valve replacement 2006, memory difficulty, pacemaker, persistent a-fib, hx of scarlet fever, shortness of  breath, hx of CABG 2006, cholecystectomy 2015, neuropathy  PRECAUTIONS: Fall  WEIGHT BEARING RESTRICTIONS: No  PAIN:  Are you having pain? Yes: Unrated, but states "I have pain every day I fall"  FALLS: Has patient fallen in last 6 months? Yes. Number of falls fall frequently, reports falling 3x today  LIVING ENVIRONMENT: Lives with: lives with their spouse Lives in: House/apartment Stairs:  ramped entrance Has following equipment at home: Grab bars, Ramped entry, and U-step walker  PLOF: Independent with basic ADLs and Requires assistive device for independence  PATIENT GOALS: to work on his balance  OBJECTIVE:   HAND DOMINANCE: Left  ADLs: Overall ADLs: Pt reports it takes about 30 mins to get dressed, but nobody helps him with that.  Reports that he does not need any help with any bathroom transfers.  He utilizes a hook on pants button instead of buttoning and wears step in shoes to eliminate tying shoes. Transfers/ambulation related to ADLs: Utilizes U-step walker  Equipment: Grab bars and Walk in shower  IADLs: Light housekeeping: changes bed linen Medication management: wife administers Handwriting: 75% legible, shaky  MOBILITY STATUS: Hx of falls and ambulates with U-step walker and falls frequently  POSTURE COMMENTS:  rounded shoulders and forward head  FUNCTIONAL OUTCOME MEASURES: Fastening/unfastening 3 buttons: 5:16.46 (Pt utilizing all fingers intermittently to attempt to fasten buttons) Physical performance test: PPT#2 (simulated eating) 22.93 sec & PPT#4 (donning/doffing jacket): 2:41.1 (attempted for 45 seconds with inability to thread 2nd arm, therefore switched arms)  COORDINATION: 9 Hole Peg test: Right: 2.43.9; Left: 3:47.9 (pt dropping multiple pegs and benefiting from picking up from table top by sliding off edge of table)  09/30/22: Box and blocks: L: 28 blocks, R: 37 blocks  UE ROM:   R shoulder flexion 110*, L shoulder flexion  110*  SENSATION: "Feels like ice"  COGNITION: Overall cognitive status: No family/caregiver present to determine baseline cognitive functioning and HOH makes this determination more difficult  OBSERVATIONS: Bradykinesia   TODAY'S TREATMENT:                                                                                                                               10/05/22 Coordination: picking up and placing large jacks into matching colored cups with focus on large amplitude with grasp  and release.  Pt knocking cups over when reaching with LUE.  OT challenging pt to focus on quality of movement in addition to amplitude when pouring jacks from cups.  Pt dropping and knocking over cups with L hand, and not at all with R hand. Cards: sorting playing cards with R and then L hand with focus on large amplitude with picking up cards and sorting by suit for cognitive challenge.  Noted pt with increased sliding of cards as task continued.  OT increased challenge to completing card sorting task in standing.  OT providing frequent cues for reaching to facilitate increased amplitude and extension instead of tossing cards towards piles.   09/30/22 Don/doff jacket: 2:41.1.  Pt attempted for 45 seconds with inability to thread 2nd arm, therefore removed sleeve from arm and switched arms.  OT educated on modified cape technique to facilitate reaching around neck/shoulders to increase success with advancing jacket to thread 2nd arm.  Pt requiring hand over hand to complete, however with massed practice may be able to don jacket with decreased time and effort. Box and blocks: Box and blocks: L: 28 blocks, R: 37 blocks Cup stacking: engaged in stacking cups in pyramid shape with focus on motor control and complete grasp on cup.  OT educating on hand placement on cups to facilitate increased motor control as pt with tendency to spill or drop.  Pt completing stacking and unstacking in single stack and pyramid pattern  with min cues for increased grasp on cups.   09/28/22  NA, eval only   PATIENT EDUCATION: Education details: Educated on role and purpose of OT as well as potential interventions and goals for therapy based on initial evaluation findings. Person educated: Patient and Spouse Education method: Explanation Education comprehension: verbalized understanding and needs further education  HOME EXERCISE PROGRAM: TBD  GOALS: Goals reviewed with patient? Yes  SHORT TERM GOALS: Target date: 10/30/22  Pt will perform PD-specific HEP with cueing prn from caregiver  Baseline: Goal status: IN PROGRESS  2.  Pt will improve R hand coordination for ADLs (buttoning/tying shoes) as shown by completing 9-hole peg test in 2 min or less  Baseline: Right: 2.43.9; Left: 3:47.9  Goal status: IN PROGRESS  3.  Pt will improve L hand coordination for ADLs (buttoning/tying shoes) as shown by completing 9-hole peg test in 3 mins or less  Baseline: Right: 2.43.9; Left: 3:47.9  Goal status: IN PROGRESS  4.  Pt/caregiver will verbalize understanding of ways to decr risk of future complcations related to PD (including falls).  Baseline:  Goal status: IN PROGRESS   LONG TERM GOALS: Target date: 11/27/22  Pt/caregiver will verbalize understanding of AE/strategies to increase ease, safety, and independence with ADLs/IADLs  Baseline:  Goal status: IN PROGRESS  2.  Pt will improve functional reaching/coordination for ADLs as shown by improving score on box and blocks test by at least 5 with dominant LUE  Baseline:  L: 28 blocks, R: 37 blocks Goal status: IN PROGRESS  3.  Pt will complete 3 button/unbutton in 4 mins or less with use of AE PRN to demonstrate increased coordination and motor control to manage clothing fasteners. Baseline:  5:16.46  Goal status: IN PROGRESS  4.  Pt will demonstrate improved ease with feeding as evidenced by decreasing PPT#2 (self feeding) by 3 secs Baseline: PPT#2 (simulated  eating) 22.93 sec  Goal status: IN PROGRESS  5.  Pt will demonstrate ability to retrieve a lightweight object at moderate range to increase shoulder  flexion. Baseline:  Goal status: IN PROGRESS  ASSESSMENT:  CLINICAL IMPRESSION: Pt demonstrating quick movements with sorting tasks, frequently knocking over cups with L hand and sliding cards off table vs picking up as directed.  Pt demonstrating impulsivity, requiring frequent cues to facilitate large amplitude movements and quality of movements.  Pt requires increased time and repetition of cues due to significant HOH.  PERFORMANCE DEFICITS: in functional skills including ADLs, IADLs, coordination, ROM, strength, Fine motor control, Gross motor control, mobility, balance, body mechanics, decreased knowledge of use of DME, and UE functional use and psychosocial skills including environmental adaptation and routines and behaviors.   IMPAIRMENTS: are limiting patient from ADLs and IADLs.    PLAN:  OT FREQUENCY: 2x/week  OT DURATION: 8 weeks  PLANNED INTERVENTIONS: self care/ADL training, therapeutic exercise, therapeutic activity, neuromuscular re-education, passive range of motion, balance training, functional mobility training, ultrasound, compression bandaging, moist heat, cryotherapy, patient/family education, cognitive remediation/compensation, psychosocial skills training, energy conservation, coping strategies training, and DME and/or AE instructions  CONSULTED AND AGREED WITH PLAN OF CARE: Patient and family member/caregiver  PLAN FOR NEXT SESSION: Initiate education on large amplitude and full/modified grasp on containers, donning/doffing jacket, FMC tasks   Damaria Stofko, OTR/L 10/05/2022, 1:38 PM

## 2022-10-05 NOTE — Therapy (Signed)
OUTPATIENT SPEECH LANGUAGE PATHOLOGY TREATMENT   Patient Name: Donald [name] MRN: [id number] DOB:1940-06-23, 82 y.o., male Today's Date: 10/05/2022  PCP: Gaspar Garbe, MD REFERRING PROVIDER: Vladimir Faster, DO  END OF SESSION:  End of Session - 10/05/22 1723     Visit Number 3    Number of Visits 17    Date for SLP Re-Evaluation 11/23/22    Authorization Type [employer]    SLP Start Time 1405    SLP Stop Time  1445    SLP Time Calculation (min) 40 min    Activity Tolerance Patient tolerated treatment well              Past Medical History:  Diagnosis Date   Abnormality of gait 08/27/2014   Anxiety    Aortic stenosis    s/p AVR by Dr Laneta Simmers   Arthritis    left wrist- probable- not diagonised   Asthma    in the past   CAD (coronary artery disease)    s/p CABG 2006   GERD (gastroesophageal reflux disease)    H/O seasonal allergies    H/O: rheumatic fever    Hearing deficit    Bilateral hearing aids   Hematuria    had CT scan   Hiatal hernia    History of kidney stones    HOH (hard of hearing)    Hyperlipidemia    Hypertension    Hyperthyroidism    Inguinal hernia    right side; watching at this time per wife's report   Memory difficulty 08/27/2014   Pacemaker    Parkinson's disease    Persistent atrial fibrillation (HCC)    Scarlet fever    as a child   Shortness of breath    Sick sinus syndrome (HCC)    s/p PPM (MDT) 2006   Past Surgical History:  Procedure Laterality Date   AORTIC VALVE REPLACEMENT  2006   CARDIOVERSION N/A 09/14/2013   Procedure: CARDIOVERSION;  Surgeon: Thurmon Fair, MD;  Location: [facility] ENDOSCOPY;  Service: Cardiovascular;  Laterality: N/A;   CHOLECYSTECTOMY N/A 07/21/2013   Procedure: LAPAROSCOPIC CHOLECYSTECTOMY;  Surgeon: Mariella Saa, MD;  Location: [facility] OR;  Service: General;  Laterality: N/A;   COLONOSCOPY W/ POLYPECTOMY     COLONOSCOPY WITH PROPOFOL N/A 08/31/2016   Procedure: COLONOSCOPY WITH PROPOFOL;  Surgeon:  Charlott Rakes, MD;  Location: [facility] ENDOSCOPY;  Service: Endoscopy;  Laterality: N/A;   CORONARY ARTERY BYPASS GRAFT  2006   DENTAL SURGERY     implants   INSERT / REPLACE / REMOVE PACEMAKER  2006, 2012   MDT for sick sinus syndrome, gen change 8/12 by [facility]   KIDNEY STONE SURGERY  1984   Patient Active Problem List   Diagnosis Date Noted   HOH (hard of hearing) 12/18/2020   Personal history of colonic polyps 08/31/2016   Abnormality of gait 08/27/2014   Memory difficulty 08/27/2014   Permanent atrial fibrillation (HCC) 09/08/2013   Cholelithiases 07/19/2013   Cholelithiasis 07/19/2013   Encounter for therapeutic drug monitoring 07/03/2013   Mechanical aortic valve 04/05/2013   Parkinson disease 09/27/2012   Orthostatic hypotension 04/01/2012   Pacemaker-Medtronic 03/29/2012   HEPATITIS A 06/04/2010   HYPERTHYROIDISM 06/04/2010   HYPERLIPIDEMIA 06/04/2010   Essential hypertension 06/04/2010   Coronary artery disease involving native coronary artery of native heart without angina pectoris 06/04/2010   SICK SINUS SYNDROME 06/04/2010   HIATAL HERNIA 06/04/2010   SHORTNESS OF BREATH 06/04/2010   CHEST PAIN 06/04/2010  ONSET DATE: referred 09/01/22  REFERRING DIAG: G20.B2 (ICD-10-CM) - Parkinson's disease with dyskinesia and fluctuating manifestations   THERAPY DIAG:  Dysarthria and anarthria  Cognitive communication deficit  Dysphagia, oropharyngeal phase  Rationale for Evaluation and Treatment: Rehabilitation  SUBJECTIVE:   SUBJECTIVE STATEMENT: "(Unintelligible)" (pt, after SLP "What happened to you?") Pt arrived with rt black eye Pt accompanied by: significant other    PERTINENT HISTORY: Dx wtih PD 2012. MBSS in July 2020 revealed moderate dysphagia with significant pharyngeal residue, silent aspiration, reduce tongue base, epiglittic inversion, pharyngeal contraction, hocking and drool. Prior ST course 2022 at [facility].   PAIN:  Are you having pain? No  FALLS:  Has patient fallen in last 6 months?  See PT evaluation for details   PATIENT GOALS: swallow and talk better  OBJECTIVE:   DIAGNOSTIC FINDINGS: MBSS 2022 Clinical Impression Patient presents with mildly worsening moderate oropharyngeal dysphagia c/b sensorimotor deficits.  Oral control and strength impaired allowing premature spillage of liquid into pharynx.    Pharyngeal dysphagia is worse than 2020 study - Pharyngeal contraction, tongue base retraction, epiglottic deflection and airway closure are compromised resulting in variable pharyngeal retention (vallecular more than pyriform and worse with increased viscosity) without awareness.  Pharyngeal secretions present and mixed with barium at pyriform sinus.  With puree - 30% of bolus transited into esophagus with first swallow, dys3- severe pharyngeal retention noted.  Pt senses very large amounts of retention but not mod and mild amounts. Dry swallows not effective to decrease retention - however liquid swallows helpful to decrease but not fully clear.  Thin and nectar aspirated due to decreased adequacy of laryngeal closure, from penetrates transiting below vocal cords and also from pharyngeal retention spilling into airway post-swallow.     CLINICAL SWALLOW ASSESSMENT:   Current diet: regular & thin Dentition: poor condition Patient directly observed with POs: Yes: thin liquids  Feeding: able to feed self Liquids provided by: straw Oral phase signs and symptoms: none Pharyngeal phase signs and symptoms: immediate cough, all trials Comments: dysphonia present throughout evaluation, wet vocal quality does not clear with cued throat clear and reswallow  PATIENT REPORTED OUTCOME MEASURES (PROM): PROM was completed today (CES) with a score of 13/32. Higher scores indicate greater effectiveness of communication.   TODAY'S TREATMENT:                                                                                                                                          DATE:  10/05/22: SLP cont to require to use very loud speech volume for pt to demonstrate understanding of SLP speech. Since last session pt has completed /a/ and sentences once/day, with two days BID. SLP told pt to complete every day BID.  Simultaneous production necessary for loud /a/ - otherwise pt with loudness decay after 2-3 seconds. SLP told [name]) she may need to complete loud /a/ with  pt based upon today's production/s. SLP wonders the effectiveness of pt's practice thus far, if wife is not assisting pt. SLP cont to remind pt throughout session to "shout, so we can understand you" and that pt normal speech is mumbling in nature and this cannot be understood. Pt benefited from occasional cues to incr volume for the remaining time throughout the session (14 minutes) to "shout". [name] endorsed it was easier to hear pt during session today than it is at home, and especially in the car. SLP told [name] to use "shout" as a cue for pt at home and also in the car, to see if this improves pt's loudness/clarity.  09/30/22: Pt wife waiting on a call back from scheduler for MBSS. SLP required to provide a very loud speaking volume for pt to respond, or ask SLP for repeat. [name] stated pt had hearing aids serviced "about a month ago." SLP may suggest return to hearing aid dispenser for service/checkup. Max cues needed for loud /a/ including modeling. Pt average loudness upper 80s dB with average 6 seconds, even with max cues for longer production. SLP told pt 10 reps, BID. SLP educated pt/wife that pt and wife should generate 10 everyday sentences to practice at home BID after the loud /a/ reps. When SLP inquired how consistent pt is with performing other HEPs in the past [name] stated, "He isn't too good with doing (the HEP) at home." SLP reiterated that San must do HEP at least 5 days/week for there to be any possibility of progress with speech intelligibility and/or with  swallow strength.SLP provided pt/wife with HEP checklist for completion with "ahhhhhh" and "everyday sentences" slots, to encourage HEP completion. At the end of session wife provided Sheeley with a sip of water and pt immediately coughed after an audible swallow.  09/28/22: Initiated training on dysarthria strategies: slow, loud, over-articulate, with practice at phrase reading and generative levels. Direct model required to optimize production and improve intelligibility. Education on POC and goals, to which pt and spouse verbalize agreement.   PATIENT EDUCATION: Education details: see above Person educated: Patient and Spouse Education method: Explanation, Demonstration, Verbal cues, and Handouts Education comprehension: verbalized understanding, returned demonstration, verbal cues required, and needs further education  HOME EXERCISE PROGRAM: Sustained "ah," oral reading   GOALS: Goals reviewed with patient? Yes  SHORT TERM GOALS: Target date: 10/26/2022  Pt will participate in MBSS Baseline: Goal status: IN PROGRESS  2.  Pt will demonstrate use of swallow compensations with use of visual aid and occasional verbal cues during structured practice Baseline:  Goal status: IN PROGRESS  3.  Pt will use dysarthria strategies, resulting in intelligible production of 15/20 sentences  Baseline:  Goal status: IN PROGRESS  4.  Pt will demonstrate dysphagia exercises with mod-A over 2 sessions  Baseline:  Goal status: IN PROGRESS   LONG TERM GOALS: Target date: 11/23/2022  Pt will complete dysphagia and dysarthria HEP 5/7 days over 1 week period Baseline:  Goal status: IN PROGRESS  2.  Pt and spouse will report carryover of swallow compensations and strategies at home Baseline:  Goal status: IN PROGRESS  3.  Pt will use dysarthria strategies during 10 minute conversation, resulting in 80% intelligibility  Baseline:  Goal status: IN PROGRESS  ASSESSMENT:  CLINICAL  IMPRESSION: Patient is a 82 y.o. M who was seen today for moderate dysarthria, c/b low vocal intensity and imprecise articulation which results in significantly decreased intelligibility. Pt would benefit from cont'd skilled ST to address  aforementioned deficits to reduce caregiver burden , enhance communication efficacy, and facilitate improved swallow function/safety .   OBJECTIVE IMPAIRMENTS: Objective impairments include attention, memory, executive functioning, dysarthria, and dysphagia. These impairments are limiting patient from effectively communicating at home and in community and safety when swallowing.Factors affecting potential to achieve goals and functional outcome are ability to learn/carryover information and co-morbidities.. Patient will benefit from skilled SLP services to address above impairments and improve overall function.  REHAB POTENTIAL: Good  PLAN:  SLP FREQUENCY: 2x/week  SLP DURATION: 8 weeks  PLANNED INTERVENTIONS: Aspiration precaution training, Pharyngeal strengthening exercises, Diet toleration management , Environmental controls, Trials of upgraded texture/liquids, Cueing hierachy, Internal/external aids, Functional tasks, Multimodal communication approach, SLP instruction and feedback, Compensatory strategies, Patient/family education, and Re-evaluation    Mahogony Gilchrest, CCC-SLP 10/05/2022, 5:23 PM

## 2022-10-05 NOTE — Progress Notes (Signed)
Phone call to pt to discuss his CT scan with contrast on 10/06/22. Spoke to the patients wife who is the primary caregiver. She reports the patient experienced mild itching, only in one spot on his arm, after his last CT scan with contrast. No intervention was done to stop the itching as it subsided by itself. The patient denied any rash, hives, swelling of his tongue, lips, difficulty breathing etc... Spoke to Dr. Charise Killian regarding this patient and we will forgo the 13 hr prep and requested that the patient take 50 mg of benadryl 1 hour prior to his CT scan. This was called into [facility] in [place] at the caregivers request. Advised the pt/cg that this medication can cause drowsiness.

## 2022-10-06 ENCOUNTER — Other Ambulatory Visit (HOSPITAL_COMMUNITY): Payer: Self-pay | Admitting: *Deleted

## 2022-10-06 ENCOUNTER — Ambulatory Visit
Admission: RE | Admit: 2022-10-06 | Discharge: 2022-10-06 | Disposition: A | Discharge status: Home / Self Care | Payer: PPO | Source: Ambulatory Visit | Attending: Neurology | Admitting: Neurology

## 2022-10-06 DIAGNOSIS — W19XXXA Unspecified fall, initial encounter: Secondary | ICD-10-CM

## 2022-10-06 DIAGNOSIS — R1310 Dysphagia, unspecified: Secondary | ICD-10-CM

## 2022-10-06 DIAGNOSIS — R270 Ataxia, unspecified: Secondary | ICD-10-CM

## 2022-10-06 DIAGNOSIS — S0990XA Unspecified injury of head, initial encounter: Secondary | ICD-10-CM | POA: Diagnosis not present

## 2022-10-06 MED ORDER — IOPAMIDOL (ISOVUE-370) INJECTION 76%
75.0000 mL | Freq: Once | INTRAVENOUS | Status: AC | PRN
  Administered 2022-10-06: 75 mL via INTRAVENOUS

## 2022-10-06 NOTE — Telephone Encounter (Signed)
Spoke to radiology and patient is not compatible to the MRI due to pacemaker is not compatible. Will schedule CTA Head and neck he is due to to be scanned on 10-06-22

## 2022-10-07 ENCOUNTER — Ambulatory Visit: Payer: PPO | Admitting: Physical Therapy

## 2022-10-07 ENCOUNTER — Ambulatory Visit: Payer: PPO

## 2022-10-07 ENCOUNTER — Encounter: Payer: Self-pay | Admitting: Rehabilitative and Restorative Service Providers"

## 2022-10-07 ENCOUNTER — Encounter: Payer: Self-pay | Admitting: Physical Therapy

## 2022-10-07 ENCOUNTER — Ambulatory Visit: Payer: PPO | Admitting: Rehabilitative and Restorative Service Providers"

## 2022-10-07 DIAGNOSIS — R41841 Cognitive communication deficit: Secondary | ICD-10-CM

## 2022-10-07 DIAGNOSIS — M6281 Muscle weakness (generalized): Secondary | ICD-10-CM

## 2022-10-07 DIAGNOSIS — R2681 Unsteadiness on feet: Secondary | ICD-10-CM

## 2022-10-07 DIAGNOSIS — R471 Dysarthria and anarthria: Secondary | ICD-10-CM

## 2022-10-07 DIAGNOSIS — G20B2 Parkinson's disease with dyskinesia, with fluctuations: Secondary | ICD-10-CM

## 2022-10-07 DIAGNOSIS — R29818 Other symptoms and signs involving the nervous system: Secondary | ICD-10-CM

## 2022-10-07 DIAGNOSIS — R2689 Other abnormalities of gait and mobility: Secondary | ICD-10-CM

## 2022-10-07 DIAGNOSIS — R1312 Dysphagia, oropharyngeal phase: Secondary | ICD-10-CM

## 2022-10-07 DIAGNOSIS — Z9181 History of falling: Secondary | ICD-10-CM

## 2022-10-07 NOTE — Therapy (Addendum)
OUTPATIENT SPEECH LANGUAGE PATHOLOGY TREATMENT   Patient Name: Donald [name] MRN: [id number] DOB:12/07/1940, 82 y.o., male Today's Date: 10/07/2022  PCP: Gaspar Garbe, MD REFERRING PROVIDER: Vladimir Faster, DO  END OF SESSION:  End of Session - 10/07/22 1409     Visit Number 4    Number of Visits 17    Date for SLP Re-Evaluation 11/23/22    SLP Start Time 1320    SLP Stop Time  1400    SLP Time Calculation (min) 40 min    Activity Tolerance Patient tolerated treatment well               Past Medical History:  Diagnosis Date   Abnormality of gait 08/27/2014   Anxiety    Aortic stenosis    s/p AVR by Dr Laneta Simmers   Arthritis    left wrist- probable- not diagonised   Asthma    in the past   CAD (coronary artery disease)    s/p CABG 2006   GERD (gastroesophageal reflux disease)    H/O seasonal allergies    H/O: rheumatic fever    Hearing deficit    Bilateral hearing aids   Hematuria    had CT scan   Hiatal hernia    History of kidney stones    HOH (hard of hearing)    Hyperlipidemia    Hypertension    Hyperthyroidism    Inguinal hernia    right side; watching at this time per wife's report   Memory difficulty 08/27/2014   Pacemaker    Parkinson's disease    Persistent atrial fibrillation (HCC)    Scarlet fever    as a child   Shortness of breath    Sick sinus syndrome (HCC)    s/p PPM (MDT) 2006   Past Surgical History:  Procedure Laterality Date   AORTIC VALVE REPLACEMENT  2006   CARDIOVERSION N/A 09/14/2013   Procedure: CARDIOVERSION;  Surgeon: Thurmon Fair, MD;  Location: [facility] ENDOSCOPY;  Service: Cardiovascular;  Laterality: N/A;   CHOLECYSTECTOMY N/A 07/21/2013   Procedure: LAPAROSCOPIC CHOLECYSTECTOMY;  Surgeon: Mariella Saa, MD;  Location: [facility] OR;  Service: General;  Laterality: N/A;   COLONOSCOPY W/ POLYPECTOMY     COLONOSCOPY WITH PROPOFOL N/A 08/31/2016   Procedure: COLONOSCOPY WITH PROPOFOL;  Surgeon: Charlott Rakes, MD;   Location: [facility] ENDOSCOPY;  Service: Endoscopy;  Laterality: N/A;   CORONARY ARTERY BYPASS GRAFT  2006   DENTAL SURGERY     implants   INSERT / REPLACE / REMOVE PACEMAKER  2006, 2012   MDT for sick sinus syndrome, gen change 8/12 by [facility]   KIDNEY STONE SURGERY  1984   Patient Active Problem List   Diagnosis Date Noted   HOH (hard of hearing) 12/18/2020   Personal history of colonic polyps 08/31/2016   Abnormality of gait 08/27/2014   Memory difficulty 08/27/2014   Permanent atrial fibrillation (HCC) 09/08/2013   Cholelithiases 07/19/2013   Cholelithiasis 07/19/2013   Encounter for therapeutic drug monitoring 07/03/2013   Mechanical aortic valve 04/05/2013   Parkinson disease 09/27/2012   Orthostatic hypotension 04/01/2012   Pacemaker-Medtronic 03/29/2012   HEPATITIS A 06/04/2010   HYPERTHYROIDISM 06/04/2010   HYPERLIPIDEMIA 06/04/2010   Essential hypertension 06/04/2010   Coronary artery disease involving native coronary artery of native heart without angina pectoris 06/04/2010   SICK SINUS SYNDROME 06/04/2010   HIATAL HERNIA 06/04/2010   SHORTNESS OF BREATH 06/04/2010   CHEST PAIN 06/04/2010    ONSET DATE: referred 09/01/22  REFERRING DIAG: G20.B2 (ICD-10-CM) - Parkinson's disease with dyskinesia and fluctuating manifestations   THERAPY DIAG:  Dysarthria and anarthria  Cognitive communication deficit  Dysphagia, oropharyngeal phase  Rationale for Evaluation and Treatment: Rehabilitation  SUBJECTIVE:   SUBJECTIVE STATEMENT: "I'm gonna find out Friday -about some of this. I have that swallow test." Pt accompanied by: self    PERTINENT HISTORY: Dx wtih PD 2012. MBSS in July 2020 revealed moderate dysphagia with significant pharyngeal residue, silent aspiration, reduce tongue base, epiglittic inversion, pharyngeal contraction, hocking and drool. Prior ST course 2022 at [facility].   PAIN:  Are you having pain? No  FALLS: Has patient fallen in last 6 months?  See PT  evaluation for details   PATIENT GOALS: swallow and talk better  OBJECTIVE:   DIAGNOSTIC FINDINGS: MBSS 2022 Clinical Impression Patient presents with mildly worsening moderate oropharyngeal dysphagia c/b sensorimotor deficits.  Oral control and strength impaired allowing premature spillage of liquid into pharynx.    Pharyngeal dysphagia is worse than 2020 study - Pharyngeal contraction, tongue base retraction, epiglottic deflection and airway closure are compromised resulting in variable pharyngeal retention (vallecular more than pyriform and worse with increased viscosity) without awareness.  Pharyngeal secretions present and mixed with barium at pyriform sinus.  With puree - 30% of bolus transited into esophagus with first swallow, dys3- severe pharyngeal retention noted.  Pt senses very large amounts of retention but not mod and mild amounts. Dry swallows not effective to decrease retention - however liquid swallows helpful to decrease but not fully clear.  Thin and nectar aspirated due to decreased adequacy of laryngeal closure, from penetrates transiting below vocal cords and also from pharyngeal retention spilling into airway post-swallow.     CLINICAL SWALLOW ASSESSMENT:   Current diet: regular & thin Dentition: poor condition Patient directly observed with POs: Yes: thin liquids  Feeding: able to feed self Liquids provided by: straw Oral phase signs and symptoms: none Pharyngeal phase signs and symptoms: immediate cough, all trials Comments: dysphonia present throughout evaluation, wet vocal quality does not clear with cued throat clear and reswallow  PATIENT REPORTED OUTCOME MEASURES (PROM): PROM was completed today (CES) with a score of 13/32. Higher scores indicate greater effectiveness of communication.   TODAY'S TREATMENT:                                                                                                                                         DATE:  10/07/22: Pt  told SLP MBS scheduled for Friday 10/09/22. Arrived with "wet" voice and took x3 attempts to clear. Once cleared, pt did not have to clear again for remainder of session. SLP provided max A for /a/ x10 - simultaneous production needed. SLP provided max A to slow down and read sentences using one breath per sentence, and shout sentences. SLP worked remainder of session with consistent mod cues to "shout" when pt speaks  in sentence responses. When doing so, SLP understood [name] 75% of the time, however even with a cue to shout, [place] only used incr'd volume and articulation 70% of the time. When not "shouting", SLP understood [name] ~20% of the time.  Pt had reportedly completed exercises twice yesterday.   10/05/22: SLP cont to require to use very loud speech volume for pt to demonstrate understanding of SLP speech. Since last session pt has completed /a/ and sentences once/day, with two days BID. SLP told pt to complete every day BID.  Simultaneous production necessary for loud /a/ - otherwise pt with loudness decay after 2-3 seconds. SLP told [name]) she may need to complete loud /a/ with pt based upon today's production/s. SLP wonders the effectiveness of pt's practice thus far, if wife is not assisting pt. SLP cont to remind pt throughout session to "shout, so we can understand you" and that pt normal speech is mumbling in nature and this cannot be understood. Pt benefited from occasional cues to incr volume for the remaining time throughout the session (14 minutes) to "shout". [name] endorsed it was easier to hear pt during session today than it is at home, and especially in the car. SLP told [name] to use "shout" as a cue for pt at home and also in the car, to see if this improves pt's loudness/clarity.  09/30/22: Pt wife waiting on a call back from scheduler for MBSS. SLP required to provide a very loud speaking volume for pt to respond, or ask SLP for repeat. [name] stated pt had hearing  aids serviced "about a month ago." SLP may suggest return to hearing aid dispenser for service/checkup. Max cues needed for loud /a/ including modeling. Pt average loudness upper 80s dB with average 6 seconds, even with max cues for longer production. SLP told pt 10 reps, BID. SLP educated pt/wife that pt and wife should generate 10 everyday sentences to practice at home BID after the loud /a/ reps. When SLP inquired how consistent pt is with performing other HEPs in the past [name] stated, "He isn't too good with doing (the HEP) at home." SLP reiterated that Florance must do HEP at least 5 days/week for there to be any possibility of progress with speech intelligibility and/or with swallow strength.SLP provided pt/wife with HEP checklist for completion with "ahhhhhh" and "everyday sentences" slots, to encourage HEP completion. At the end of session wife provided Leja with a sip of water and pt immediately coughed after an audible swallow.  09/28/22: Initiated training on dysarthria strategies: slow, loud, over-articulate, with practice at phrase reading and generative levels. Direct model required to optimize production and improve intelligibility. Education on POC and goals, to which pt and spouse verbalize agreement.   PATIENT EDUCATION: Education details: see above Person educated: Patient and Spouse Education method: Explanation, Demonstration, Verbal cues, and Handouts Education comprehension: verbalized understanding, returned demonstration, verbal cues required, and needs further education  HOME EXERCISE PROGRAM: Sustained "ah," oral reading   GOALS: Goals reviewed with patient? Yes  SHORT TERM GOALS: Target date: 10/26/2022  Pt will participate in MBSS Baseline: Goal status: IN PROGRESS  2.  Pt will demonstrate use of swallow compensations with use of visual aid and occasional verbal cues during structured practice Baseline:  Goal status: IN PROGRESS  3.  Pt will use dysarthria  strategies, resulting in intelligible production of 15/20 sentences  Baseline:  Goal status: IN PROGRESS  4.  Pt will demonstrate dysphagia exercises with mod-A over 2 sessions  Baseline:  Goal status: IN PROGRESS   LONG TERM GOALS: Target date: 11/23/2022  Pt will complete dysphagia and dysarthria HEP 5/7 days over 1 week period Baseline:  Goal status: IN PROGRESS  2.  Pt and spouse will report carryover of swallow compensations and strategies at home Baseline:  Goal status: IN PROGRESS  3.  Pt will use dysarthria strategies during 10 minute conversation, resulting in 80% intelligibility  Baseline:  Goal status: IN PROGRESS  ASSESSMENT:  CLINICAL IMPRESSION: Patient is a 82 y.o. M who was seen today for moderate dysarthria, c/b low vocal intensity and imprecise articulation which results in significantly decreased intelligibility. Pt would benefit from cont'd skilled ST to address aforementioned deficits to reduce caregiver burden , enhance communication efficacy, and facilitate improved swallow function/safety .   OBJECTIVE IMPAIRMENTS: Objective impairments include attention, memory, executive functioning, dysarthria, and dysphagia. These impairments are limiting patient from effectively communicating at home and in community and safety when swallowing.Factors affecting potential to achieve goals and functional outcome are ability to learn/carryover information and co-morbidities.. Patient will benefit from skilled SLP services to address above impairments and improve overall function.  REHAB POTENTIAL: Good  PLAN:  SLP FREQUENCY: 2x/week  SLP DURATION: 8 weeks  PLANNED INTERVENTIONS: Aspiration precaution training, Pharyngeal strengthening exercises, Diet toleration management , Environmental controls, Trials of upgraded texture/liquids, Cueing hierachy, Internal/external aids, Functional tasks, Multimodal communication approach, SLP instruction and feedback, Compensatory  strategies, Patient/family education, and Re-evaluation    Tacha Manni, CCC-SLP 10/07/2022, 2:10 PM

## 2022-10-07 NOTE — Therapy (Signed)
OUTPATIENT PHYSICAL THERAPY NEURO TREATMENT  Patient Name: Donald [name] MRN: [id number] DOB:[birth year], 82 y.o., male Today's Date: 10/07/2022   PCP: Gaspar Garbe, MD REFERRING PROVIDER: Vladimir Faster, DO  END OF SESSION:  PT End of Session - 10/07/22 1422     Visit Number 4    Number of Visits 12    Date for PT Re-Evaluation 11/09/22    Authorization Type [employer]    PT Start Time 1443    PT Stop Time 1527    PT Time Calculation (min) 44 min    Equipment Utilized During Treatment Gait belt    Activity Tolerance Patient tolerated treatment well    Behavior During Therapy Impulsive   improving with cues            Past Medical History:  Diagnosis Date   Abnormality of gait 08/27/2014   Anxiety    Aortic stenosis    s/p AVR by Dr Laneta Simmers   Arthritis    left wrist- probable- not diagonised   Asthma    in the past   CAD (coronary artery disease)    s/p CABG 2006   GERD (gastroesophageal reflux disease)    H/O seasonal allergies    H/O: rheumatic fever    Hearing deficit    Bilateral hearing aids   Hematuria    had CT scan   Hiatal hernia    History of kidney stones    HOH (hard of hearing)    Hyperlipidemia    Hypertension    Hyperthyroidism    Inguinal hernia    right side; watching at this time per wife's report   Memory difficulty 08/27/2014   Pacemaker    Parkinson's disease    Persistent atrial fibrillation (HCC)    Scarlet fever    as a child   Shortness of breath    Sick sinus syndrome (HCC)    s/p PPM (MDT) 2006   Past Surgical History:  Procedure Laterality Date   AORTIC VALVE REPLACEMENT  2006   CARDIOVERSION N/A 09/14/2013   Procedure: CARDIOVERSION;  Surgeon: Thurmon Fair, MD;  Location: [facility] ENDOSCOPY;  Service: Cardiovascular;  Laterality: N/A;   CHOLECYSTECTOMY N/A 07/21/2013   Procedure: LAPAROSCOPIC CHOLECYSTECTOMY;  Surgeon: Mariella Saa, MD;  Location: [facility] OR;  Service: General;  Laterality: N/A;    COLONOSCOPY W/ POLYPECTOMY     COLONOSCOPY WITH PROPOFOL N/A 08/31/2016   Procedure: COLONOSCOPY WITH PROPOFOL;  Surgeon: Charlott Rakes, MD;  Location: [facility] ENDOSCOPY;  Service: Endoscopy;  Laterality: N/A;   CORONARY ARTERY BYPASS GRAFT  2006   DENTAL SURGERY     implants   INSERT / REPLACE / REMOVE PACEMAKER  2006, 2012   MDT for sick sinus syndrome, gen change 8/12 by [facility]   KIDNEY STONE SURGERY  1984   Patient Active Problem List   Diagnosis Date Noted   HOH (hard of hearing) 12/18/2020   Personal history of colonic polyps 08/31/2016   Abnormality of gait 08/27/2014   Memory difficulty 08/27/2014   Permanent atrial fibrillation (HCC) 09/08/2013   Cholelithiases 07/19/2013   Cholelithiasis 07/19/2013   Encounter for therapeutic drug monitoring 07/03/2013   Mechanical aortic valve 04/05/2013   Parkinson disease 09/27/2012   Orthostatic hypotension 04/01/2012   Pacemaker-Medtronic 03/29/2012   HEPATITIS A 06/04/2010   HYPERTHYROIDISM 06/04/2010   HYPERLIPIDEMIA 06/04/2010   Essential hypertension 06/04/2010   Coronary artery disease involving native coronary artery of native heart without angina pectoris 06/04/2010   SICK SINUS  SYNDROME 06/04/2010   HIATAL HERNIA 06/04/2010   SHORTNESS OF BREATH 06/04/2010   CHEST PAIN 06/04/2010    ONSET DATE: 2014  REFERRING DIAG:  G20.B2 (ICD-10-CM) - Parkinson's disease with dyskinesia and fluctuating manifestations    THERAPY DIAG:  Unsteadiness on feet  Other abnormalities of gait and mobility  Other symptoms and signs involving the nervous system  Muscle weakness (generalized)  Rationale for Evaluation and Treatment: Rehabilitation  SUBJECTIVE:                                                                                                                                                                                             SUBJECTIVE STATEMENT: Wife reports they have come up with some cues-she will tap her chest to  mean taller posture and she will tap his shoulder to have him stop.  Wife does report that he had a fall yesterday backing up to put trash in the trash can.   Pt accompanied by: self-spouse in lobby  PERTINENT HISTORY: (from recent PT screen)  Timed Up and Go test:  29.82 sec with U-step (compared to 21.41 sec at d/c)  10 meter walk test: 24.5 sec = 1.34 ft/sec (compared to 2.25 ft/sec at d/c)  5 time sit to stand test: 12 sec with UE support (compared 19.63 sec)  PAIN:  Are you having pain? No  PRECAUTIONS: Fall, HOH  WEIGHT BEARING RESTRICTIONS: No  FALLS: Has patient fallen in last 6 months? Yes. Number of falls numerous/often multiple per day  LIVING ENVIRONMENT: Lives with: lives with their spouse Lives in: House/apartment Stairs:  reports ramp access in/out Has following equipment at home: Environmental consultant - 4 wheeled and U-step  PLOF: Requires assistive device for independence and Needs assistance with homemaking  PATIENT GOALS: Improve balance and walking  OBJECTIVE:    TODAY'S TREATMENT: 10/07/2022 Activity Comments  Gait 65 ft x 3, with U-step rollator, using laser line Min guard and cues for SLOW MOTION pace, and for improved R foot clearance  Seated LAQ 2 x 10, 3# Kick to pool noodle target  Seated march 2 x 10 3# Touch pool noodle target  Seated ankle pumps 2 x 10 reps 3 sec hold, tap to floor  Seated hamstring stretch, 8 reps x 5 sec Foot propped at floor, seated at edge of mat  Forward gait/backward gait with U-step rollator, varied distances 10-15 ft with min assist (no laser line) Max cues, extra time in posterior direction for slowed pace, larger steps for more stable backwards walking; pt improves with repetition  Gait 50 ft with U-step rollator, using laser line, min assist  Initial cues and then cues to negotiate through doorway with larger steps   Pt performs PWR! Moves in seated position x 10 reps   PWR! Up for improved posture   PWR! Rock for improved  weighshifting   PWR! Twist for improved trunk rotation    PWR! Step for improved step initiation -using obstacle to step over   Cues provided for slow, sustained movement patterns  Access Code: [email or web address] URL: [email or web address] Date: 10/07/2022 Prepared by: [facility] - Outpatient  Rehab - [facility]  Exercises - Seated Hamstring Stretch  - 2 x daily - 5 x weekly - 1 sets - 3 reps - 10 sec hold - Seated Ankle Dorsiflexion AROM  - 1-2 x daily - 7 x weekly - 2 sets - 5 reps - 3 sec hold  PATIENT EDUCATION: Education details: Reminders for SLOWED pace with gait, continued to reinforce wife's cues for patient "stick out chest, heels to ground, long strides like slow motion".  HEP initiated-see above; use of laser line to help with increased step length. Person educated: Patient and Spouse, at end of session Education method: Explanation, Demonstration, and Verbal cues Education comprehension: verbalized understanding, returned demonstration, and needs further education  ----------------------------------------------------------------------------- Objective measures below taken at initial evaluation:  DIAGNOSTIC FINDINGS:   COGNITION: Overall cognitive status: No family/caregiver present to determine baseline cognitive functioning, HOH makes this determination more difficult   SENSATION: WFL  COORDINATION: Grossly impaired    MUSCLE TONE: ratcheting in BLE  MUSCLE LENGTH: Hamstrings: Right -20 deg; Left -20 deg     POSTURE: rounded shoulders and forward head  LOWER EXTREMITY ROM:     Active  Right Eval Left Eval  Hip flexion    Hip extension    Hip abduction    Hip adduction    Hip internal rotation    Hip external rotation    Knee flexion -10 -10  Knee extension    Ankle dorsiflexion 8 11  Ankle plantarflexion    Ankle inversion    Ankle eversion     (Blank rows = not tested)  LOWER EXTREMITY MMT:    Grossly 4/5 BLE  BED  MOBILITY:  NT  TRANSFERS: Assistive device utilized: Environmental consultant - 4 wheeled  Sit to stand: Modified independence and CGA Stand to sit: Modified independence and SBA Chair to chair: Modified independence and CGA Floor:  NT  RAMP:  NT  CURB:  Level of Assistance: CGA Assistive device utilized: Environmental consultant - 4 wheeled Curb Comments:   STAIRS: NT  GAIT: Gait pattern: shuffling and festinating Distance walked:  Assistive device utilized:  U-step Level of assistance: CGA and Min A Comments:   FUNCTIONAL TESTS:  5 times sit to stand: 33 sec w/ retro-LOB, pushing backs of knees to chair, multiple attempts needed Timed up and go (TUG): 23 sec. TUG w/ cog: 32.91 sec 10 meter walk test: 18.12 sec = 1.8 ft/sec  Mini-BESTest: 7/28      PATIENT EDUCATION: Education details: assessment findings Person educated: Patient Education method: Explanation Education comprehension: verbalized understanding  HOME EXERCISE PROGRAM: TBD  GOALS: Goals reviewed with patient? Yes  SHORT TERM GOALS: Target date: 10/19/2022    Patient will perform HEP with family/caregiver supervision for improved strength, balance, transfers, and gait  Baseline: Goal status: IN PROGRESS  2.  Manifest improved balance and BLE strength per 20 sec 5xSTS test without compensatory movements Baseline: 33 sec w/ compensations Goal status: IN PROGRESS    LONG TERM GOALS: Target date: 11/09/2022  Reduce risk for falls per score 18/28 Mini-BESTest Baseline: 7/28 Goal status: IN PROGRESS  2.  Demo improved efficiency and safety with ambulation maintaining speed of 2.25 ft/sec w/ least restrictive AD to improve community ambulation Baseline: 1.8 ft/sec Goal status: IN PROGRESS  3.  Demo improved safety with gait and negotiating turns per time of 15 sec TUG test Baseline: 23 sec w/ U-step Goal status: IN PROGRESS    ASSESSMENT:  CLINICAL IMPRESSION: Skilled PT session today with focus on seated PWR! Moves  and lower extremity strengthening with visual cues (from therapist and from mirror) as well as verbal cues to move in SLOW motion.  Focused also on functional gait training with U-step rollator, with and without use of laser line.  Max cues provided for slowed pace, weightshifting and increased step length in posterior direction.  Overall, pt is able to sustain improved foot clearance and decreased shuffling gait pattern, improved from last visit with this therapist (09/30/2022).  He continues to need cues, but at end of session, when therapist asked pt what he was doing differently, pt reports thinking about slowing down.  He will continue to benefit from skilled PT for reinforcing large amplitude, slowed movement patterns for improved carryover with gait and functional mobility.  OBJECTIVE IMPAIRMENTS: Abnormal gait, decreased activity tolerance, decreased balance, decreased coordination, decreased endurance, decreased mobility, difficulty walking, decreased ROM, decreased strength, impaired flexibility, impaired tone, improper body mechanics, and postural dysfunction.   ACTIVITY LIMITATIONS: carrying, lifting, bending, standing, transfers, reach over head, and locomotion level  PARTICIPATION LIMITATIONS: meal prep, cleaning, laundry, driving, shopping, community activity, and yard work  PERSONAL FACTORS: Age, Time since onset of injury/illness/exacerbation, and 3+ comorbidities: PMH  are also affecting patient's functional outcome.   REHAB POTENTIAL: Good  CLINICAL DECISION MAKING: Evolving/moderate complexity  EVALUATION COMPLEXITY: Moderate  PLAN:  PT FREQUENCY: 1-2x/week  PT DURATION: 6 weeks  PLANNED INTERVENTIONS: Therapeutic exercises, Therapeutic activity, Neuromuscular re-education, Balance training, Gait training, Patient/Family education, Self Care, Joint mobilization, Stair training, Vestibular training, Canalith repositioning, DME instructions, Aquatic Therapy, Dry Needling,  Electrical stimulation, Spinal mobilization, Cryotherapy, Moist heat, Taping, Traction, Ultrasound, Ionotophoresis 4mg /ml Dexamethasone, and Manual therapy  PLAN FOR NEXT SESSION: Review HEP, continue with seated PWR moves, seated exercises, standing march, kicks with cues for SLOWED motion; functional gait with U-step rollator-forward/back walking, turns   Lonia Blood, PT 10/07/22 4:09 PM Phone: [phone] Fax: [phone]  [facility] at [facility] [address] [place], [place] [address] Phone # [phone] Fax # [phone]

## 2022-10-07 NOTE — Progress Notes (Signed)
Remote pacemaker transmission.   

## 2022-10-07 NOTE — Therapy (Signed)
OUTPATIENT OCCUPATIONAL THERAPY [facility]  Treatment Note  Patient Name: Donald [name] MRN: [id number] DOB:03-17-1941, 82 y.o., male Today's Date: 10/07/2022  PCP: Gaspar Garbe, MD REFERRING PROVIDER: Vladimir Faster, DO  END OF SESSION:  OT End of Session - 10/07/22 1439     Visit Number 4    Number of Visits 17    Date for OT Re-Evaluation 11/27/22    Authorization Type [employer]    OT Start Time 1404    OT Stop Time 1438    OT Time Calculation (min) 34 min    Activity Tolerance No increased pain;Patient tolerated treatment well    Behavior During Therapy [facility] for tasks assessed/performed;Impulsive            Past Medical History:  Diagnosis Date   Abnormality of gait 08/27/2014   Anxiety    Aortic stenosis    s/p AVR by Dr Laneta Simmers   Arthritis    left wrist- probable- not diagonised   Asthma    in the past   CAD (coronary artery disease)    s/p CABG 2006   GERD (gastroesophageal reflux disease)    H/O seasonal allergies    H/O: rheumatic fever    Hearing deficit    Bilateral hearing aids   Hematuria    had CT scan   Hiatal hernia    History of kidney stones    HOH (hard of hearing)    Hyperlipidemia    Hypertension    Hyperthyroidism    Inguinal hernia    right side; watching at this time per wife's report   Memory difficulty 08/27/2014   Pacemaker    Parkinson's disease    Persistent atrial fibrillation (HCC)    Scarlet fever    as a child   Shortness of breath    Sick sinus syndrome (HCC)    s/p PPM (MDT) 2006   Past Surgical History:  Procedure Laterality Date   AORTIC VALVE REPLACEMENT  2006   CARDIOVERSION N/A 09/14/2013   Procedure: CARDIOVERSION;  Surgeon: Thurmon Fair, MD;  Location: [facility] ENDOSCOPY;  Service: Cardiovascular;  Laterality: N/A;   CHOLECYSTECTOMY N/A 07/21/2013   Procedure: LAPAROSCOPIC CHOLECYSTECTOMY;  Surgeon: Mariella Saa, MD;  Location: [facility] OR;  Service: General;  Laterality: N/A;   COLONOSCOPY W/  POLYPECTOMY     COLONOSCOPY WITH PROPOFOL N/A 08/31/2016   Procedure: COLONOSCOPY WITH PROPOFOL;  Surgeon: Charlott Rakes, MD;  Location: [facility] ENDOSCOPY;  Service: Endoscopy;  Laterality: N/A;   CORONARY ARTERY BYPASS GRAFT  2006   DENTAL SURGERY     implants   INSERT / REPLACE / REMOVE PACEMAKER  2006, 2012   MDT for sick sinus syndrome, gen change 8/12 by [facility]   KIDNEY STONE SURGERY  1984   Patient Active Problem List   Diagnosis Date Noted   HOH (hard of hearing) 12/18/2020   Personal history of colonic polyps 08/31/2016   Abnormality of gait 08/27/2014   Memory difficulty 08/27/2014   Permanent atrial fibrillation (HCC) 09/08/2013   Cholelithiases 07/19/2013   Cholelithiasis 07/19/2013   Encounter for therapeutic drug monitoring 07/03/2013   Mechanical aortic valve 04/05/2013   Parkinson disease 09/27/2012   Orthostatic hypotension 04/01/2012   Pacemaker-[employer] 03/29/2012   HEPATITIS A 06/04/2010   HYPERTHYROIDISM 06/04/2010   HYPERLIPIDEMIA 06/04/2010   Essential hypertension 06/04/2010   Coronary artery disease involving native coronary artery of native heart without angina pectoris 06/04/2010   SICK SINUS SYNDROME 06/04/2010   HIATAL HERNIA 06/04/2010  SHORTNESS OF BREATH 06/04/2010   CHEST PAIN 06/04/2010    ONSET DATE: referral date 09/01/22 (PD diagnosis 2014)  REFERRING DIAG: G20.B2 (ICD-10-CM) - Parkinson's disease with dyskinesia and fluctuating manifestations  THERAPY DIAG:  Muscle weakness (generalized)  Parkinson's disease with dyskinesia and fluctuating manifestations  History of falling  Rationale for Evaluation and Treatment: Rehabilitation  SUBJECTIVE:   SUBJECTIVE STATEMENT: Pt reports his biggest issue is falling down.  Also that he has to pay someone 80$ to mow his 3 acres, but he wants to [place] it.  He mumbles and is difficult to understand.    Pt accompanied by: self   PERTINENT HISTORY: Parkinson's Disease. PMH: hx of falls (wears  helment), arthritis, asthma, CAD, GERD, HOH, HLD, HTN, hyperthyroidism, anxiety, aortic stenosis, hx of aortic valve replacement 2006, memory difficulty, pacemaker, persistent a-fib, hx of scarlet fever, shortness of breath, hx of CABG 2006, cholecystectomy 2015, neuropathy  PRECAUTIONS: Fall; WEIGHT BEARING RESTRICTIONS: No  PAIN:  Are you having pain? None today  FALLS: Has patient fallen in last 6 months? Yes. Number of falls fall frequently, reports falling 3x today  LIVING ENVIRONMENT: Lives with: lives with their spouse Lives in: House/apartment Stairs:  ramped entrance Has following equipment at home: Grab bars, Ramped entry, and U-step walker  PLOF: Independent with basic ADLs and Requires assistive device for independence  PATIENT GOALS: to work on his balance   OBJECTIVE:  (All objective assessments below are from initial evaluation on: 09/28/22 unless otherwise specified.)   HAND DOMINANCE: Left  ADLs: Overall ADLs: Pt reports it takes about 30 mins to get dressed, but nobody helps him with that.  Reports that he does not need any help with any bathroom transfers.  He utilizes a hook on pants button instead of buttoning and wears step in shoes to eliminate tying shoes. Transfers/ambulation related to ADLs: Utilizes U-step walker  Equipment: Grab bars and Walk in shower  IADLs: Light housekeeping: changes bed linen Medication management: wife administers Handwriting: 75% legible, shaky  MOBILITY STATUS: Hx of falls and ambulates with U-step walker and falls frequently  POSTURE COMMENTS:  rounded shoulders and forward head  FUNCTIONAL OUTCOME MEASURES: Fastening/unfastening 3 buttons: 5:16.46 (Pt utilizing all fingers intermittently to attempt to fasten buttons) Physical performance test: PPT#2 (simulated eating) 22.93 sec & PPT#4 (donning/doffing jacket): 2:41.1 (attempted for 45 seconds with inability to thread 2nd arm, therefore switched arms)  COORDINATION: 9  Hole Peg test: Right: 2.43.9; Left: 3:47.9 (pt dropping multiple pegs and benefiting from picking up from table top by sliding off edge of table)  09/30/22: Box and blocks: L: 28 blocks, R: 37 blocks  UE ROM:   R shoulder flexion 110*, L shoulder flexion 110*  SENSATION: "Feels like ice"  COGNITION: Overall cognitive status: No family/caregiver present to determine baseline cognitive functioning and HOH makes this determination more difficult  OBSERVATIONS: Bradykinesia   TODAY'S TREATMENT:  10/07/22: Due to Lt RF flexion contracture, OT fabricates static progressive DIP J ext orthosis and it fits well, he feels a non-painful stretch. He is advised to use only in the night as tolerated to help correct deformity.   Next, he states his biggest issues are falling and reaching up high, so OT works on functional transfers and safety cues with walking with him. He is cued to "slow down" and take bigger steps, not on the tips of his toes but on flat feet.  He seems to respond to this but is somewhat impulsive and hard to understand at times. For "reaching up high," he is given the following HEP to try 2-3 x day at home, seated and safe. He demo's them back well without pain and moving fairly well- just fast and impulsive. Perhaps this comes from history of falls and head trauma. He states mowing his lawn or paying someone and OT suggests downsizing to a less rigorous living environment, perhaps (he has 3 acres in [place]). Wife is not present and he can think of no other goals or functional concerns, so session is ended at that point. He goes to PT.   Exercises - Seated Shoulder Flexion Towel Slide at Table Top  - 2-3 x daily - 3-5 reps - 15 hold - Seated Shoulder Flexion AAROM with Dowel  - 2-3 x daily - 1 sets - 10-15 reps - Seated Shoulder Abduction AAROM with Dowel  - 2-3 x  daily - 1 sets - 10-15 reps    10/05/22 Coordination: picking up and placing large jacks into matching colored cups with focus on large amplitude with grasp and release.  Pt knocking cups over when reaching with LUE.  OT challenging pt to focus on quality of movement in addition to amplitude when pouring jacks from cups.  Pt dropping and knocking over cups with L hand, and not at all with R hand. Cards: sorting playing cards with R and then L hand with focus on large amplitude with picking up cards and sorting by suit for cognitive challenge.  Noted pt with increased sliding of cards as task continued.  OT increased challenge to completing card sorting task in standing.  OT providing frequent cues for reaching to facilitate increased amplitude and extension instead of tossing cards towards piles.   PATIENT EDUCATION: Education details: Educated on role and purpose of OT as well as potential interventions and goals for therapy based on initial evaluation findings. Person educated: Patient and Spouse Education method: Explanation Education comprehension: verbalized understanding and needs further education  HOME EXERCISE PROGRAM: Access Code: [email or web address] URL: [email or web address] Date: 10/07/2022 Prepared by: Fannie Knee   GOALS: Goals reviewed with patient? Yes  SHORT TERM GOALS: Target date: 10/30/22  Pt will perform PD-specific HEP with cueing prn from caregiver  Baseline: Goal status: IN PROGRESS  2.  Pt will improve R hand coordination for ADLs (buttoning/tying shoes) as shown by completing 9-hole peg test in 2 min or less  Baseline: Right: 2.43.9; Left: 3:47.9  Goal status: IN PROGRESS  3.  Pt will improve L hand coordination for ADLs (buttoning/tying shoes) as shown by completing 9-hole peg test in 3 mins or less  Baseline: Right: 2.43.9; Left: 3:47.9  Goal status: IN PROGRESS  4.  Pt/caregiver will verbalize understanding of ways to decr risk of future  complcations related to PD (including falls).  Baseline:  Goal status: IN PROGRESS   LONG TERM GOALS: Target date: 11/27/22  Pt/caregiver will verbalize understanding of  AE/strategies to increase ease, safety, and independence with ADLs/IADLs  Baseline:  Goal status: IN PROGRESS  2.  Pt will improve functional reaching/coordination for ADLs as shown by improving score on box and blocks test by at least 5 with dominant LUE  Baseline:  L: 28 blocks, R: 37 blocks Goal status: IN PROGRESS  3.  Pt will complete 3 button/unbutton in 4 mins or less with use of AE PRN to demonstrate increased coordination and motor control to manage clothing fasteners. Baseline:  5:16.46  Goal status: IN PROGRESS  4.  Pt will demonstrate improved ease with feeding as evidenced by decreasing PPT#2 (self feeding) by 3 secs Baseline: PPT#2 (simulated eating) 22.93 sec  Goal status: IN PROGRESS  5.  Pt will demonstrate ability to retrieve a lightweight object at moderate range to increase shoulder flexion. Baseline:  Goal status: IN PROGRESS  ASSESSMENT:  CLINICAL IMPRESSION: 10/07/22: Pt's main issues seems to be impulsivity and safety hazards, lack of awareness of theses things.  He would benefit from his wife being in session, but she was not here to help with awareness, safety carry over or to point out needed goals of treatment.  Treatment and effect will be limited at best with no caregiver support. If he cannot identify more areas of need, early discharge may be called for as treatment options are then severely limited.    PLAN:  OT FREQUENCY: 2x/week  OT DURATION: 8 weeks  PLANNED INTERVENTIONS: self care/ADL training, therapeutic exercise, therapeutic activity, neuromuscular re-education, passive range of motion, balance training, functional mobility training, ultrasound, compression bandaging, moist heat, cryotherapy, patient/family education, cognitive remediation/compensation, psychosocial skills  training, energy conservation, coping strategies training, and DME and/or AE instructions  CONSULTED AND AGREED WITH PLAN OF CARE: Patient and family member/caregiver  PLAN FOR NEXT SESSION: Try to address issues and identify safety modifications with him and his spouse, if able.    Fannie Knee, OTR/L 10/07/2022, 5:16 PM

## 2022-10-08 ENCOUNTER — Encounter (HOSPITAL_COMMUNITY): Payer: PPO

## 2022-10-08 NOTE — Therapy (Signed)
OUTPATIENT OCCUPATIONAL THERAPY PARKINSON[facility]S  Treatment Note  Patient Name: Donald [name] MRN: [id number] DOB:Jan 14, 1941, 82 y.o., male Today's Date: 10/14/2022  PCP: Gaspar Garbe, MD REFERRING PROVIDER: Vladimir Faster, DO  END OF SESSION:  OT End of Session - 10/14/22 1310     Visit Number 5    Number of Visits 17    Date for OT Re-Evaluation 11/27/22    Authorization Type [employer]    OT Start Time 1111    OT Stop Time 1149    OT Time Calculation (min) 38 min    Activity Tolerance No increased pain;Patient tolerated treatment well    Behavior During Therapy [facility] for tasks assessed/performed;Impulsive            Past Medical History:  Diagnosis Date   Abnormality of gait 08/27/2014   Anxiety    Aortic stenosis    s/p AVR by Dr Laneta Simmers   Arthritis    left wrist- probable- not diagonised   Asthma    in the past   CAD (coronary artery disease)    s/p CABG 2006   GERD (gastroesophageal reflux disease)    H/O seasonal allergies    H/O: rheumatic fever    Hearing deficit    Bilateral hearing aids   Hematuria    had CT scan   Hiatal hernia    History of kidney stones    HOH (hard of hearing)    Hyperlipidemia    Hypertension    Hyperthyroidism    Inguinal hernia    right side; watching at this time per wife's report   Memory difficulty 08/27/2014   Pacemaker    Parkinson's disease    Persistent atrial fibrillation (HCC)    Scarlet fever    as a child   Shortness of breath    Sick sinus syndrome (HCC)    s/p PPM (MDT) 2006   Past Surgical History:  Procedure Laterality Date   AORTIC VALVE REPLACEMENT  2006   CARDIOVERSION N/A 09/14/2013   Procedure: CARDIOVERSION;  Surgeon: Thurmon Fair, MD;  Location: [facility] ENDOSCOPY;  Service: Cardiovascular;  Laterality: N/A;   CHOLECYSTECTOMY N/A 07/21/2013   Procedure: LAPAROSCOPIC CHOLECYSTECTOMY;  Surgeon: Mariella Saa, MD;  Location: [facility] OR;  Service: General;  Laterality: N/A;   COLONOSCOPY W/  POLYPECTOMY     COLONOSCOPY WITH PROPOFOL N/A 08/31/2016   Procedure: COLONOSCOPY WITH PROPOFOL;  Surgeon: Charlott Rakes, MD;  Location: [facility] ENDOSCOPY;  Service: Endoscopy;  Laterality: N/A;   CORONARY ARTERY BYPASS GRAFT  2006   DENTAL SURGERY     implants   INSERT / REPLACE / REMOVE PACEMAKER  2006, 2012   MDT for sick sinus syndrome, gen change 8/12 by [facility]   KIDNEY STONE SURGERY  1984   Patient Active Problem List   Diagnosis Date Noted   HOH (hard of hearing) 12/18/2020   Personal history of colonic polyps 08/31/2016   Abnormality of gait 08/27/2014   Memory difficulty 08/27/2014   Permanent atrial fibrillation (HCC) 09/08/2013   Cholelithiases 07/19/2013   Cholelithiasis 07/19/2013   Encounter for therapeutic drug monitoring 07/03/2013   Mechanical aortic valve 04/05/2013   Parkinson disease 09/27/2012   Orthostatic hypotension 04/01/2012   Pacemaker-[employer] 03/29/2012   HEPATITIS A 06/04/2010   HYPERTHYROIDISM 06/04/2010   HYPERLIPIDEMIA 06/04/2010   Essential hypertension 06/04/2010   Coronary artery disease involving native coronary artery of native heart without angina pectoris 06/04/2010   SICK SINUS SYNDROME 06/04/2010   HIATAL HERNIA 06/04/2010  SHORTNESS OF BREATH 06/04/2010   CHEST PAIN 06/04/2010    ONSET DATE: referral date 09/01/22 (PD diagnosis 2014)  REFERRING DIAG: G20.B2 (ICD-10-CM) - Parkinson's disease with dyskinesia and fluctuating manifestations  THERAPY DIAG:  Muscle weakness (generalized)  Parkinson's disease with dyskinesia and fluctuating manifestations  History of falling  Unsteadiness on feet  Other symptoms and signs involving the nervous system  Rationale for Evaluation and Treatment: Rehabilitation  SUBJECTIVE:   SUBJECTIVE STATEMENT: Pt's spouse reports his BP was 71/46 this morning and would like a check now.   OT checks and it's 125/77 with pulse at 73 BPM.  (It was similar when checked again.)   His wife states that  most of his falls (recently and historically) have been outside, climbing up on the tractor, etc.    Pt accompanied by: self & spouse [name]    PERTINENT HISTORY: Parkinson's Disease. PMH: hx of falls (wears helment), arthritis, asthma, CAD, GERD, HOH, HLD, HTN, hyperthyroidism, anxiety, aortic stenosis, hx of aortic valve replacement 2006, memory difficulty, pacemaker, persistent a-fib, hx of scarlet fever, shortness of breath, hx of CABG 2006, cholecystectomy 2015, neuropathy  PRECAUTIONS: Fall; WEIGHT BEARING RESTRICTIONS: No  PAIN:  Are you having pain? None today reported  FALLS: Has patient fallen in last 6 months? Yes. Number of falls fall frequently, reports falling 3x today  LIVING ENVIRONMENT: Lives with: lives with their spouse Lives in: House/apartment Stairs:  ramped entrance Has following equipment at home: Grab bars, Ramped entry, and U-step walker  PLOF: Independent with basic ADLs and Requires assistive device for independence  PATIENT GOALS: to work on his balance   OBJECTIVE:  (All objective assessments below are from initial evaluation on: 09/28/22 unless otherwise specified.)   HAND DOMINANCE: Left  ADLs: Overall ADLs: Pt reports it takes about 30 mins to get dressed, but nobody helps him with that.  Reports that he does not need any help with any bathroom transfers.  He utilizes a hook on pants button instead of buttoning and wears step in shoes to eliminate tying shoes. Transfers/ambulation related to ADLs: Utilizes U-step walker  Equipment: Grab bars and Walk in shower  IADLs: Light housekeeping: changes bed linen Medication management: wife administers Handwriting: 75% legible, shaky  MOBILITY STATUS: Hx of falls and ambulates with U-step walker and falls frequently  POSTURE COMMENTS:  rounded shoulders and forward head  FUNCTIONAL OUTCOME MEASURES: Fastening/unfastening 3 buttons: 5:16.46 (Pt utilizing all fingers intermittently to attempt to  fasten buttons) Physical performance test: PPT#2 (simulated eating) 22.93 sec & PPT#4 (donning/doffing jacket): 2:41.1 (attempted for 45 seconds with inability to thread 2nd arm, therefore switched arms)  COORDINATION: 9 Hole Peg test: Right: 2.43.9; Left: 3:47.9 (pt dropping multiple pegs and benefiting from picking up from table top by sliding off edge of table)  09/30/22: Box and blocks: L: 28 blocks, R: 37 blocks  UE ROM:   R shoulder flexion 110*, L shoulder flexion 110*  SENSATION: "Feels like ice"  COGNITION: Overall cognitive status: No family/caregiver present to determine baseline cognitive functioning and HOH makes this determination more difficult  OBSERVATIONS:    10/14/22: today he presents less impulsive than last session- more sedate, quite and slow. More typical of PD.  He is still toe-walking.    TODAY'S TREATMENT:  10/14/22: His wife states that he is stubborn and won't listen to her, also that most of his falls are from going outside on gravel and trying to get on his tractor. He does cognitive activity with OT (SLUMS TEST) to help determine a baseline for cognition and he scores 14/30 points indicating "dementia level" cognition.  (OT did try to ensure that he could hear and reapeat back info, though very poor hearing could have had at least a mild effect.)  His wife is cautioned that this may mean he is not safe to be left alone, especially as he has hx of repeatedly/impulsively falling from tractor after trying to get back on. It shows his poor safety awareness, etc. For his self-care, safety she is recommended to not left him try high risk activities himself. Next, he does a building activity for FMS and direction following to build a replica simple model.  He needs min A cues to complete correctly. He also does seated catch/throw for UE fnl reaching  activity/timing/coordination.   He did not bring finger orthosis to be checked- his wife is informed about it's purpose.  She does leave the session 1/2 way through, as she fels her presence distracts him.  It seems like he is more dependent on her, in OT opinion (due to hearing and cognition).    10/07/22: Due to Lt RF flexion contracture, OT fabricates static progressive DIP J ext orthosis and it fits well, he feels a non-painful stretch. He is advised to use only in the night as tolerated to help correct deformity.   Next, he states his biggest issues are falling and reaching up high, so OT works on functional transfers and safety cues with walking with him. He is cued to "slow down" and take bigger steps, not on the tips of his toes but on flat feet.  He seems to respond to this but is somewhat impulsive and hard to understand at times. For "reaching up high," he is given the following HEP to try 2-3 x day at home, seated and safe. He demo's them back well without pain and moving fairly well- just fast and impulsive. Perhaps this comes from history of falls and head trauma. He states mowing his lawn or paying someone and OT suggests downsizing to a less rigorous living environment, perhaps (he has 3 acres in [place]). Wife is not present and he can think of no other goals or functional concerns, so session is ended at that point. He goes to PT.   Exercises - Seated Shoulder Flexion Towel Slide at Table Top  - 2-3 x daily - 3-5 reps - 15 hold - Seated Shoulder Flexion AAROM with Dowel  - 2-3 x daily - 1 sets - 10-15 reps - Seated Shoulder Abduction AAROM with Dowel  - 2-3 x daily - 1 sets - 10-15 reps  PATIENT EDUCATION: Education details: Educated on role and purpose of OT as well as potential interventions and goals for therapy based on initial evaluation findings. Person educated: Patient and Spouse Education method: Explanation Education comprehension: verbalized understanding and needs  further education  HOME EXERCISE PROGRAM: Access Code: [email or web address] URL: [email or web address] Date: 10/07/2022 Prepared by: Fannie Knee   GOALS: Goals reviewed with patient? Yes  SHORT TERM GOALS: Target date: 10/30/22  Pt will perform PD-specific HEP with cueing prn from caregiver  Baseline: Goal status: IN PROGRESS  2.  Pt will improve R hand coordination for ADLs (buttoning/tying shoes) as shown by completing 9-hole peg test in  2 min or less  Baseline: Right: 2.43.9; Left: 3:47.9  Goal status: IN PROGRESS  3.  Pt will improve L hand coordination for ADLs (buttoning/tying shoes) as shown by completing 9-hole peg test in 3 mins or less  Baseline: Right: 2.43.9; Left: 3:47.9  Goal status: IN PROGRESS  4.  Pt/caregiver will verbalize understanding of ways to decr risk of future complcations related to PD (including falls).  Baseline:  Goal status: IN PROGRESS   LONG TERM GOALS: Target date: 11/27/22  Pt/caregiver will verbalize understanding of AE/strategies to increase ease, safety, and independence with ADLs/IADLs  Baseline:  Goal status: IN PROGRESS  2.  Pt will improve functional reaching/coordination for ADLs as shown by improving score on box and blocks test by at least 5 with dominant LUE  Baseline:  L: 28 blocks, R: 37 blocks Goal status: IN PROGRESS  3.  Pt will complete 3 button/unbutton in 4 mins or less with use of AE PRN to demonstrate increased coordination and motor control to manage clothing fasteners. Baseline:  5:16.46  Goal status: IN PROGRESS  4.  Pt will demonstrate improved ease with feeding as evidenced by decreasing PPT#2 (self feeding) by 3 secs Baseline: PPT#2 (simulated eating) 22.93 sec  Goal status: IN PROGRESS  5.  Pt will demonstrate ability to retrieve a lightweight object at moderate range to increase shoulder flexion. Baseline:  Goal status: IN PROGRESS  ASSESSMENT:  CLINICAL IMPRESSION: 10/14/22: He seems more  limited by cognition, poor hearing, and poor safety awareness than other issues. His Ue's are fairly quick for GMS, though FMS are somewhat poor.   10/07/22: Pt's main issues seems to be impulsivity and safety hazards, lack of awareness of theses things.  He would benefit from his wife being in session, but she was not here to help with awareness, safety carry over or to point out needed goals of treatment.  Treatment and effect will be limited at best with no caregiver support. If he cannot identify more areas of need, early discharge may be called for as treatment options are then severely limited.    PLAN:  OT FREQUENCY: 2x/week  OT DURATION: 8 weeks  PLANNED INTERVENTIONS: self care/ADL training, therapeutic exercise, therapeutic activity, neuromuscular re-education, passive range of motion, balance training, functional mobility training, ultrasound, compression bandaging, moist heat, cryotherapy, patient/family education, cognitive remediation/compensation, psychosocial skills training, energy conservation, coping strategies training, and DME and/or AE instructions  CONSULTED AND AGREED WITH PLAN OF CARE: Patient and family member/caregiver  PLAN FOR NEXT SESSION: Focus on cognitive/safety tasks, as well as FMS and also standing balance/coordination activities.    Fannie Knee, OTR/L 10/14/2022, 1:52 PM

## 2022-10-09 ENCOUNTER — Ambulatory Visit (HOSPITAL_COMMUNITY)
Admission: RE | Admit: 2022-10-09 | Discharge: 2022-10-09 | Disposition: A | Discharge status: Home / Self Care | Payer: PPO | Source: Ambulatory Visit | Attending: Internal Medicine | Admitting: Internal Medicine

## 2022-10-09 DIAGNOSIS — R41841 Cognitive communication deficit: Secondary | ICD-10-CM | POA: Diagnosis not present

## 2022-10-09 DIAGNOSIS — R1312 Dysphagia, oropharyngeal phase: Secondary | ICD-10-CM | POA: Diagnosis not present

## 2022-10-09 DIAGNOSIS — R1310 Dysphagia, unspecified: Secondary | ICD-10-CM | POA: Diagnosis not present

## 2022-10-09 DIAGNOSIS — R471 Dysarthria and anarthria: Secondary | ICD-10-CM

## 2022-10-09 NOTE — Progress Notes (Signed)
Modified Barium Swallow Study  Patient Details  Name: Donald [name] MRN: [id number] Date of Birth: [birth year]  Today's Date: 10/09/2022  Modified Barium Swallow completed.  Full report located under Chart Review in the Imaging Section.  History of Present Illness 82 yr old seen for outpatient MBS accomapanied by his wife. History of Parkinson's, GERD, hiatal hernia, pharyngeal dysphagia. MBS 2022 with aspiration of thin and nectar, significant residuals. Thin liquids recommended and to cough/"hock" after swallows. He consumes regular texture and thin liquids presently.   Clinical Impression Pt exhibits mild oral and severe pharyngeal dysphagia which has mild-moderately progressed since MBS in 2022. Orally he was able to control and propel boluses with mild delays and lingual residue. He silently aspirated thin, nectar and puree consistencies due to decreased hyoid excursion, laryngeal elevation and laryngeal vestibular closure. Nectar thick was aspirated before laryngeal closure could be achieved. Cues for strong cough did not clear aspirates and only briefly cleared penetrates before barium reappeared on vocal cords.  Reduced tongue base retraction, decreased epiglottic deflection and pharyngeal stripping wave led to max vallecular and pyriform sinus residue with puree and nectar. Multiple swallows were ineffective to decrease residue however chin tuck did appear to minimally reduce residue. Chin tuck during the swallow was not effective to protect airway nor were right or left head turns. Options were discussed with pt and wife. His wishes are to continue eating and drinking and she states pt has not had pneumonia. Continued education re: risk if pt were to experience an illness or injury that risk of pneumona and decompnesation increases. Pt to continue regular texture,thin liquids, multiple swallows with chin tuck, hard coughs after swallows, alternate liquids and solids, remain upright 1 hour  after meals and crush pills. Factors that may increase risk of adverse event in presence of aspiration [name] 2021): Frail or deconditioned;Weak cough;Aspiration of thick, dense, and/or acidic materials;Frequent aspiration of large volumes  Swallow Evaluation Recommendations Recommendations: PO diet PO Diet Recommendation: Regular;Thin liquids (Level 0) Liquid Administration via: Cup Medication Administration: Crushed with puree Supervision: Patient able to self-feed;Full supervision/cueing for swallowing strategies Swallowing strategies  : Slow rate;Small bites/sips;Chin tuck;Follow solids with liquids;Multiple dry swallows after each bite/sip;Hard cough after swallowing Postural changes: Stay upright 30-60 min after meals;Position pt fully upright for meals Oral care recommendations: Oral care BID (2x/day)      Royce Macadamia 10/09/2022,3:12 PM

## 2022-10-12 ENCOUNTER — Encounter: Payer: Self-pay | Admitting: Neurology

## 2022-10-13 ENCOUNTER — Other Ambulatory Visit: Payer: Self-pay | Admitting: Neurology

## 2022-10-13 ENCOUNTER — Other Ambulatory Visit: Payer: Self-pay

## 2022-10-13 DIAGNOSIS — G903 Multi-system degeneration of the autonomic nervous system: Secondary | ICD-10-CM

## 2022-10-13 MED ORDER — MIDODRINE HCL 5 MG PO TABS
ORAL_TABLET | ORAL | 0 refills | Status: DC
Start: 2022-10-13 — End: 2022-10-28

## 2022-10-13 MED ORDER — MIDODRINE HCL 5 MG PO TABS: ORAL_TABLET | ORAL | 5 refills | Status: DC

## 2022-10-13 NOTE — Therapy (Signed)
OUTPATIENT PHYSICAL THERAPY NEURO TREATMENT  Patient Name: Donald [name] MRN: [id number] DOB:05/07/1941, 82 y.o., male Today's Date: 10/14/2022   PCP: Gaspar Garbe, MD REFERRING PROVIDER: Vladimir Faster, DO  END OF SESSION:  PT End of Session - 10/14/22 1446     Visit Number 5    Number of Visits 12    Date for PT Re-Evaluation 11/09/22    Authorization Type [employer]    PT Start Time 1402    PT Stop Time 1445    PT Time Calculation (min) 43 min    Equipment Utilized During Treatment Gait belt    Activity Tolerance Patient tolerated treatment well    Behavior During Therapy Impulsive   improving with cues             Past Medical History:  Diagnosis Date   Abnormality of gait 08/27/2014   Anxiety    Aortic stenosis    s/p AVR by Dr Laneta Simmers   Arthritis    left wrist- probable- not diagonised   Asthma    in the past   CAD (coronary artery disease)    s/p CABG 2006   GERD (gastroesophageal reflux disease)    H/O seasonal allergies    H/O: rheumatic fever    Hearing deficit    Bilateral hearing aids   Hematuria    had CT scan   Hiatal hernia    History of kidney stones    HOH (hard of hearing)    Hyperlipidemia    Hypertension    Hyperthyroidism    Inguinal hernia    right side; watching at this time per wife's report   Memory difficulty 08/27/2014   Pacemaker    Parkinson's disease    Persistent atrial fibrillation (HCC)    Scarlet fever    as a child   Shortness of breath    Sick sinus syndrome (HCC)    s/p PPM (MDT) 2006   Past Surgical History:  Procedure Laterality Date   AORTIC VALVE REPLACEMENT  2006   CARDIOVERSION N/A 09/14/2013   Procedure: CARDIOVERSION;  Surgeon: Thurmon Fair, MD;  Location: [facility] ENDOSCOPY;  Service: Cardiovascular;  Laterality: N/A;   CHOLECYSTECTOMY N/A 07/21/2013   Procedure: LAPAROSCOPIC CHOLECYSTECTOMY;  Surgeon: Mariella Saa, MD;  Location: [facility] OR;  Service: General;  Laterality: N/A;    COLONOSCOPY W/ POLYPECTOMY     COLONOSCOPY WITH PROPOFOL N/A 08/31/2016   Procedure: COLONOSCOPY WITH PROPOFOL;  Surgeon: Charlott Rakes, MD;  Location: [facility] ENDOSCOPY;  Service: Endoscopy;  Laterality: N/A;   CORONARY ARTERY BYPASS GRAFT  2006   DENTAL SURGERY     implants   INSERT / REPLACE / REMOVE PACEMAKER  2006, 2012   MDT for sick sinus syndrome, gen change 8/12 by [facility]   KIDNEY STONE SURGERY  1984   Patient Active Problem List   Diagnosis Date Noted   HOH (hard of hearing) 12/18/2020   Personal history of colonic polyps 08/31/2016   Abnormality of gait 08/27/2014   Memory difficulty 08/27/2014   Permanent atrial fibrillation (HCC) 09/08/2013   Cholelithiases 07/19/2013   Cholelithiasis 07/19/2013   Encounter for therapeutic drug monitoring 07/03/2013   Mechanical aortic valve 04/05/2013   Parkinson disease 09/27/2012   Orthostatic hypotension 04/01/2012   Pacemaker-[employer] 03/29/2012   HEPATITIS A 06/04/2010   HYPERTHYROIDISM 06/04/2010   HYPERLIPIDEMIA 06/04/2010   Essential hypertension 06/04/2010   Coronary artery disease involving native coronary artery of native heart without angina pectoris 06/04/2010   SICK  SINUS SYNDROME 06/04/2010   HIATAL HERNIA 06/04/2010   SHORTNESS OF BREATH 06/04/2010   CHEST PAIN 06/04/2010    ONSET DATE: 2014  REFERRING DIAG:  G20.B2 (ICD-10-CM) - Parkinson's disease with dyskinesia and fluctuating manifestations    THERAPY DIAG:  Muscle weakness (generalized)  Parkinson's disease with dyskinesia and fluctuating manifestations  History of falling  Unsteadiness on feet  Other symptoms and signs involving the nervous system  Rationale for Evaluation and Treatment: Rehabilitation  SUBJECTIVE:                                                                                                                                                                                             SUBJECTIVE STATEMENT: Had a fall 2 weeks ago.    Pt accompanied by: self-spouse in lobby  PERTINENT HISTORY: (from recent PT screen)  Timed Up and Go test:  29.82 sec with U-step (compared to 21.41 sec at d/c)  10 meter walk test: 24.5 sec = 1.34 ft/sec (compared to 2.25 ft/sec at d/c)  5 time sit to stand test: 12 sec with UE support (compared 19.63 sec)  PAIN:  Are you having pain? No  PRECAUTIONS: Fall, HOH  WEIGHT BEARING RESTRICTIONS: No  FALLS: Has patient fallen in last 6 months? Yes. Number of falls numerous/often multiple per day  LIVING ENVIRONMENT: Lives with: lives with their spouse Lives in: House/apartment Stairs:  reports ramp access in/out Has following equipment at home: Dan Humphreys - 4 wheeled and U-step  PLOF: Requires assistive device for independence and Needs assistance with homemaking  PATIENT GOALS: Improve balance and walking  OBJECTIVE:     TODAY'S TREATMENT: 10/14/22 Activity Comments  Gait training with U step 3x 50-60 ft Frequent cues to stop and reset, rest heels down, and step BIG  Review of HEP: sitting HS stretch 30" sitting ankle pumps 10x  Cues for form  seated PWR moves sitting in chair against wall with boom whackers or scarves PWR! Up PWR rock PWR twist PWR step  Good pacing with PT demo in front, cues to hit in the center or hit the wall to prevent movements from running together. Required cues to slow down especially with PWR step d/t difficulty coordinating UE and LE movements. Trialed moving LEs only   standing with 2# ankle weights: March 20x HS curl 10x each alt side step Cueing to elicit proper movement pattern  For alt side step: added eye boost- calling out cards at midline to slow down movements              Access Code: [email or web address] URL: [email or web address] Date: 10/07/2022 Prepared by: [facility] -  Outpatient  Rehab - [facility]  Exercises - Seated Hamstring Stretch  - 2 x daily - 5 x weekly - 1 sets - 3 reps - 10 sec hold - Seated Ankle  Dorsiflexion AROM  - 1-2 x daily - 7 x weekly - 2 sets - 5 reps - 3 sec hold    ----------------------------------------------------------------------------- Objective measures below taken at initial evaluation:  DIAGNOSTIC FINDINGS:   COGNITION: Overall cognitive status: No family/caregiver present to determine baseline cognitive functioning, HOH makes this determination more difficult   SENSATION: WFL  COORDINATION: Grossly impaired    MUSCLE TONE: ratcheting in BLE  MUSCLE LENGTH: Hamstrings: Right -20 deg; Left -20 deg     POSTURE: rounded shoulders and forward head  LOWER EXTREMITY ROM:     Active  Right Eval Left Eval  Hip flexion    Hip extension    Hip abduction    Hip adduction    Hip internal rotation    Hip external rotation    Knee flexion -10 -10  Knee extension    Ankle dorsiflexion 8 11  Ankle plantarflexion    Ankle inversion    Ankle eversion     (Blank rows = not tested)  LOWER EXTREMITY MMT:    Grossly 4/5 BLE  BED MOBILITY:  NT  TRANSFERS: Assistive device utilized: Environmental consultant - 4 wheeled  Sit to stand: Modified independence and CGA Stand to sit: Modified independence and SBA Chair to chair: Modified independence and CGA Floor:  NT  RAMP:  NT  CURB:  Level of Assistance: CGA Assistive device utilized: Environmental consultant - 4 wheeled Curb Comments:   STAIRS: NT  GAIT: Gait pattern: shuffling and festinating Distance walked:  Assistive device utilized:  U-step Level of assistance: CGA and Min A Comments:   FUNCTIONAL TESTS:  5 times sit to stand: 33 sec w/ retro-LOB, pushing backs of knees to chair, multiple attempts needed Timed up and go (TUG): 23 sec. TUG w/ cog: 32.91 sec 10 meter walk test: 18.12 sec = 1.8 ft/sec  Mini-BESTest: 7/28      PATIENT EDUCATION: Education details: assessment findings Person educated: Patient Education method: Explanation Education comprehension: verbalized understanding  HOME EXERCISE  PROGRAM: TBD  GOALS: Goals reviewed with patient? Yes  SHORT TERM GOALS: Target date: 10/19/2022    Patient will perform HEP with family/caregiver supervision for improved strength, balance, transfers, and gait  Baseline: Goal status: IN PROGRESS  2.  Manifest improved balance and BLE strength per 20 sec 5xSTS test without compensatory movements Baseline: 33 sec w/ compensations Goal status: IN PROGRESS    LONG TERM GOALS: Target date: 11/09/2022    Reduce risk for falls per score 18/28 Mini-BESTest Baseline: 7/28 Goal status: IN PROGRESS  2.  Demo improved efficiency and safety with ambulation maintaining speed of 2.25 ft/sec w/ least restrictive AD to improve community ambulation Baseline: 1.8 ft/sec Goal status: IN PROGRESS  3.  Demo improved safety with gait and negotiating turns per time of 15 sec TUG test Baseline: 23 sec w/ U-step Goal status: IN PROGRESS    ASSESSMENT:  CLINICAL IMPRESSION: Patient arrived to session without new complaints. Reviewed HEP for max benefit- patient was able to demo proper form with instruction and cues. Used Hospital doctor as hand boost when performing seated PWR moves to proves auditory feedback and prevent movements from running together. Patient seemed to enjoy this and responded well to this cue. Standing LE strengthening revealed limited control and quick pace. Patient tolerated session  well and without complaints upon leaving.   OBJECTIVE IMPAIRMENTS: Abnormal gait, decreased activity tolerance, decreased balance, decreased coordination, decreased endurance, decreased mobility, difficulty walking, decreased ROM, decreased strength, impaired flexibility, impaired tone, improper body mechanics, and postural dysfunction.   ACTIVITY LIMITATIONS: carrying, lifting, bending, standing, transfers, reach over head, and locomotion level  PARTICIPATION LIMITATIONS: meal prep, cleaning, laundry, driving, shopping, community activity, and yard  work  PERSONAL FACTORS: Age, Time since onset of injury/illness/exacerbation, and 3+ comorbidities: PMH  are also affecting patient's functional outcome.   REHAB POTENTIAL: Good  CLINICAL DECISION MAKING: Evolving/moderate complexity  EVALUATION COMPLEXITY: Moderate  PLAN:  PT FREQUENCY: 1-2x/week  PT DURATION: 6 weeks  PLANNED INTERVENTIONS: Therapeutic exercises, Therapeutic activity, Neuromuscular re-education, Balance training, Gait training, Patient/Family education, Self Care, Joint mobilization, Stair training, Vestibular training, Canalith repositioning, DME instructions, Aquatic Therapy, Dry Needling, Electrical stimulation, Spinal mobilization, Cryotherapy, Moist heat, Taping, Traction, Ultrasound, Ionotophoresis 4mg /ml Dexamethasone, and Manual therapy  PLAN FOR NEXT SESSION: continue with seated PWR moves, seated exercises, standing march, kicks with cues for SLOWED motion; functional gait with U-step rollator-forward/back walking, turns    Anette Guarneri, PT, DPT 10/14/22 2:48 PM  [facility] [address] [place], [place] [address] Phone # [phone] Fax # [phone]

## 2022-10-14 ENCOUNTER — Encounter: Payer: Self-pay | Admitting: Rehabilitative and Restorative Service Providers"

## 2022-10-14 ENCOUNTER — Ambulatory Visit: Payer: PPO | Admitting: Physical Therapy

## 2022-10-14 ENCOUNTER — Ambulatory Visit: Payer: PPO

## 2022-10-14 ENCOUNTER — Encounter: Payer: Self-pay | Admitting: Physical Therapy

## 2022-10-14 ENCOUNTER — Ambulatory Visit: Payer: PPO | Admitting: Rehabilitative and Restorative Service Providers"

## 2022-10-14 DIAGNOSIS — Z9181 History of falling: Secondary | ICD-10-CM

## 2022-10-14 DIAGNOSIS — G20B2 Parkinson's disease with dyskinesia, with fluctuations: Secondary | ICD-10-CM

## 2022-10-14 DIAGNOSIS — R29818 Other symptoms and signs involving the nervous system: Secondary | ICD-10-CM

## 2022-10-14 DIAGNOSIS — M6281 Muscle weakness (generalized): Secondary | ICD-10-CM

## 2022-10-14 DIAGNOSIS — R41841 Cognitive communication deficit: Secondary | ICD-10-CM

## 2022-10-14 DIAGNOSIS — R2681 Unsteadiness on feet: Secondary | ICD-10-CM

## 2022-10-14 DIAGNOSIS — R1312 Dysphagia, oropharyngeal phase: Secondary | ICD-10-CM

## 2022-10-14 DIAGNOSIS — R471 Dysarthria and anarthria: Secondary | ICD-10-CM

## 2022-10-14 NOTE — Therapy (Signed)
OUTPATIENT SPEECH LANGUAGE PATHOLOGY TREATMENT   Patient Name: Donald [name] MRN: [id number] DOB:21-Mar-1941, 82 y.o., male Today's Date: 10/14/2022  PCP: Gaspar Garbe, MD REFERRING PROVIDER: Vladimir Faster, DO  END OF SESSION:  End of Session - 10/14/22 1514     Number of Visits 17    Date for SLP Re-Evaluation 11/23/22    SLP Start Time 1450    SLP Stop Time  1530    SLP Time Calculation (min) 40 min    Activity Tolerance Patient tolerated treatment well                Past Medical History:  Diagnosis Date   Abnormality of gait 08/27/2014   Anxiety    Aortic stenosis    s/p AVR by Dr Laneta Simmers   Arthritis    left wrist- probable- not diagonised   Asthma    in the past   CAD (coronary artery disease)    s/p CABG 2006   GERD (gastroesophageal reflux disease)    H/O seasonal allergies    H/O: rheumatic fever    Hearing deficit    Bilateral hearing aids   Hematuria    had CT scan   Hiatal hernia    History of kidney stones    HOH (hard of hearing)    Hyperlipidemia    Hypertension    Hyperthyroidism    Inguinal hernia    right side; watching at this time per wife's report   Memory difficulty 08/27/2014   Pacemaker    Parkinson's disease    Persistent atrial fibrillation (HCC)    Scarlet fever    as a child   Shortness of breath    Sick sinus syndrome (HCC)    s/p PPM (MDT) 2006   Past Surgical History:  Procedure Laterality Date   AORTIC VALVE REPLACEMENT  2006   CARDIOVERSION N/A 09/14/2013   Procedure: CARDIOVERSION;  Surgeon: Thurmon Fair, MD;  Location: [facility] ENDOSCOPY;  Service: Cardiovascular;  Laterality: N/A;   CHOLECYSTECTOMY N/A 07/21/2013   Procedure: LAPAROSCOPIC CHOLECYSTECTOMY;  Surgeon: Mariella Saa, MD;  Location: [facility] OR;  Service: General;  Laterality: N/A;   COLONOSCOPY W/ POLYPECTOMY     COLONOSCOPY WITH PROPOFOL N/A 08/31/2016   Procedure: COLONOSCOPY WITH PROPOFOL;  Surgeon: Charlott Rakes, MD;  Location: [facility] ENDOSCOPY;   Service: Endoscopy;  Laterality: N/A;   CORONARY ARTERY BYPASS GRAFT  2006   DENTAL SURGERY     implants   INSERT / REPLACE / REMOVE PACEMAKER  2006, 2012   MDT for sick sinus syndrome, gen change 8/12 by [facility]   KIDNEY STONE SURGERY  1984   Patient Active Problem List   Diagnosis Date Noted   HOH (hard of hearing) 12/18/2020   Personal history of colonic polyps 08/31/2016   Abnormality of gait 08/27/2014   Memory difficulty 08/27/2014   Permanent atrial fibrillation (HCC) 09/08/2013   Cholelithiases 07/19/2013   Cholelithiasis 07/19/2013   Encounter for therapeutic drug monitoring 07/03/2013   Mechanical aortic valve 04/05/2013   Parkinson disease 09/27/2012   Orthostatic hypotension 04/01/2012   Pacemaker-[employer] 03/29/2012   HEPATITIS A 06/04/2010   HYPERTHYROIDISM 06/04/2010   HYPERLIPIDEMIA 06/04/2010   Essential hypertension 06/04/2010   Coronary artery disease involving native coronary artery of native heart without angina pectoris 06/04/2010   SICK SINUS SYNDROME 06/04/2010   HIATAL HERNIA 06/04/2010   SHORTNESS OF BREATH 06/04/2010   CHEST PAIN 06/04/2010    ONSET DATE: referred 09/01/22  REFERRING DIAG: G20.B2 (ICD-10-CM) -  Parkinson's disease with dyskinesia and fluctuating manifestations   THERAPY DIAG:  Cognitive communication deficit  Dysarthria and anarthria  Dysphagia, oropharyngeal phase  Rationale for Evaluation and Treatment: Rehabilitation  SUBJECTIVE:   SUBJECTIVE STATEMENT: "Yeah." (Pt's response when SLP talking in higher pitch voice if he can understand better) "No - he won't let me. He wants to be independent." [name], when SLP asked her if she cuts food and standardizes pt bite sizes for him) Pt accompanied by: self    PERTINENT HISTORY: Dx wtih PD 2012. MBSS in July 2020 revealed moderate dysphagia with significant pharyngeal residue, silent aspiration, reduce tongue base, epiglittic inversion, pharyngeal contraction, hocking and drool.  Prior ST course 2022 at [facility].   PAIN:  Are you having pain? No  FALLS: Has patient fallen in last 6 months?  See PT evaluation for details   PATIENT GOALS: swallow and talk better  OBJECTIVE:   DIAGNOSTIC FINDINGS:  MBSS 10/09/22 Clinical Impression: Pt exhibits mild oral and severe pharyngeal dysphagia which has mild-moderately progressed since MBS in 2022. Orally he was able to control and propel boluses with mild delays and lingual residue. He silently aspirated thin, nectar and puree consistencies due to decreased hyoid excursion, laryngeal elevation and laryngeal vestibular closure. Nectar thick was aspirated before laryngeal closure could be achieved. Cues for strong cough did not clear aspirates and only briefly cleared penetrates before barium reappeared on vocal cords.  Reduced tongue base retraction, decreased epiglottic deflection and pharyngeal stripping wave led to max vallecular and pyriform sinus residue with puree and nectar. Multiple swallows were ineffective to decrease residue however chin tuck did appear to minimally reduce residue. Chin tuck during the swallow was not effective to protect airway nor were right or left head turns. Options were discussed with pt and wife. His wishes are to continue eating and drinking and she states pt has not had pneumonia. Continued education re: risk if pt were to experience an illness or injury that risk of pneumona and decompnesation increases. Pt to continue regular texture,thin liquids, multiple swallows with chin tuck, hard coughs after swallows, alternate liquids and solids, remain upright 1 hour after meals and crush pills. Factors that may increase risk of adverse event in presence of aspiration [name] & [name] 2021): Factors that may increase risk of adverse event in presence of aspiration [name] & [name] 2021): Frail or deconditioned; Weak cough; Aspiration of thick, dense, and/or acidic materials; Frequent aspiration of large  volumes Recommendations/Plan: Swallowing Evaluation Recommendations PO Diet Recommendation: Regular; Thin liquids (Level 0) Liquid Administration via: Cup Medication Administration: Crushed with puree Supervision: Patient able to self-feed; Full supervision/cueing for swallowing strategies Swallowing strategies  : Slow rate; Small bites/sips; Chin tuck; Follow solids with liquids; Multiple dry swallows after each bite/sip; Hard cough after swallowing Postural changes: Stay upright 30-60 min after meals; Position pt fully upright for meals Oral care recommendations: Oral care BID (2x/day)  PATIENT REPORTED OUTCOME MEASURES (PROM): PROM was completed (CES) with a score of 13/32. Higher scores indicate greater effectiveness of communication.   TODAY'S TREATMENT:  DATE:  10/14/22: SWALLOW: Pt with MBS 10/09/22. Results above. SLP strongly reiterated results of exam with pt/wife. SLP practiced recommendations with POs with pt, who required mod A usually for chin tuck, double swallows, and cough. Fading cues to min A occasionally for other precautions. Pt took small bites until end of session when SLP was not looking and then he took x2 bite size that he had been taking with SLP observing. SLP told pt to take smaller bites to mitigate risk of aspiration. SLP informed pt and wife that wife may need to assist pt during meals. SLP asked Strzelczyk if this was ok and he agreed. Pt is not performing speech intelligibilty exercises as directed - SLP is cautious to recommend HEP for dysphagia, suspecting pt will also not complete as directed. SPEECH: He performed loud /a/ with consistent simultaneous production and usual mod cues for full breath before production. Held loud /a/ for 5 seconds average. [name] got out pt's sentences for him and he req'd consistent max cues for taking  breath for each one and producing loud speech for each of them. SLP looked at pt's practice sheet and he is performing once/day. SLP strongly stressed BID completion and told pt and wife rationale.   10/07/22: Pt told SLP MBS scheduled for Friday 10/09/22. Arrived with "wet" voice and took x3 attempts to clear. Once cleared, pt did not have to clear again for remainder of session. SLP provided max A for /a/ x10 - simultaneous production needed. SLP provided max A to slow down and read sentences using one breath per sentence, and shout sentences. SLP worked remainder of session with consistent mod cues to "shout" when pt speaks in sentence responses. When doing so, SLP understood [name] 75% of the time, however even with a cue to shout, Kulich only used incr'd volume and articulation 70% of the time. When not "shouting", SLP understood [name] ~20% of the time.  Pt had reportedly completed exercises twice yesterday.   10/05/22: SLP cont to require to use very loud speech volume for pt to demonstrate understanding of SLP speech. Since last session pt has completed /a/ and sentences once/day, with two days BID. SLP told pt to complete every day BID.  Simultaneous production necessary for loud /a/ - otherwise pt with loudness decay after 2-3 seconds. SLP told [name]) she may need to complete loud /a/ with pt based upon today's production/s. SLP wonders the effectiveness of pt's practice thus far, if wife is not assisting pt. SLP cont to remind pt throughout session to "shout, so we can understand you" and that pt normal speech is mumbling in nature and this cannot be understood. Pt benefited from occasional cues to incr volume for the remaining time throughout the session (14 minutes) to "shout". [name] endorsed it was easier to hear pt during session today than it is at home, and especially in the car. SLP told [name] to use "shout" as a cue for pt at home and also in the car, to see if this improves  pt's loudness/clarity.  09/30/22: Pt wife waiting on a call back from scheduler for MBSS. SLP required to provide a very loud speaking volume for pt to respond, or ask SLP for repeat. [name] stated pt had hearing aids serviced "about a month ago." SLP may suggest return to hearing aid dispenser for service/checkup. Max cues needed for loud /a/ including modeling. Pt average loudness upper 80s dB with average 6 seconds, even with max cues for longer production. SLP  told pt 10 reps, BID. SLP educated pt/wife that pt and wife should generate 10 everyday sentences to practice at home BID after the loud /a/ reps. When SLP inquired how consistent pt is with performing other HEPs in the past [name] stated, "He isn't too good with doing (the HEP) at home." SLP reiterated that Melber must do HEP at least 5 days/week for there to be any possibility of progress with speech intelligibility and/or with swallow strength.SLP provided pt/wife with HEP checklist for completion with "ahhhhhh" and "everyday sentences" slots, to encourage HEP completion. At the end of session wife provided Shumard with a sip of water and pt immediately coughed after an audible swallow.  09/28/22: Initiated training on dysarthria strategies: slow, loud, over-articulate, with practice at phrase reading and generative levels. Direct model required to optimize production and improve intelligibility. Education on POC and goals, to which pt and spouse verbalize agreement.   PATIENT EDUCATION: Education details: see above Person educated: Patient and Spouse Education method: Explanation, Demonstration, Verbal cues, and Handouts Education comprehension: verbalized understanding, returned demonstration, verbal cues required, and needs further education  HOME EXERCISE PROGRAM: Sustained "ah," oral reading   GOALS: Goals reviewed with patient? Yes  SHORT TERM GOALS: Target date: 10/26/2022  Pt will participate in MBSS Baseline: Goal  status: MET  2.  Pt will demonstrate use of swallow compensations with use of visual aid and occasional verbal cues during structured practice Baseline:  Goal status: IN PROGRESS  3.  Pt will use dysarthria strategies, resulting in intelligible production of 15/20 sentences  Baseline:  Goal status: IN PROGRESS  4.  Pt will demonstrate dysphagia exercises with mod-A over 2 sessions  Baseline:  Goal status: IN PROGRESS   LONG TERM GOALS: Target date: 11/23/2022  Pt will complete dysphagia and dysarthria HEP 5/7 days over 1 week period Baseline:  Goal status: IN PROGRESS  2.  Pt and spouse will report carryover of swallow compensations and strategies at home Baseline:  Goal status: IN PROGRESS  3.  Pt will use dysarthria strategies during 10 minute conversation, resulting in 80% intelligibility  Baseline:  Goal status: IN PROGRESS  ASSESSMENT:  CLINICAL IMPRESSION: Patient is a 82 y.o. M who was seen today for moderate dysarthria, c/b low vocal intensity and imprecise articulation which results in significantly decreased intelligibility. Pt would benefit from cont'd skilled ST to address aforementioned deficits to reduce caregiver burden , enhance communication efficacy, and facilitate improved swallow function/safety . PT had MBSS last week and above recommendations were initiated today in session. SLP pondering feasibility of a dysphagia HEP when pt is not completing dysarthria HEP as directed by SLP , in order to achieve progress.  OBJECTIVE IMPAIRMENTS: Objective impairments include attention, memory, executive functioning, dysarthria, and dysphagia. These impairments are limiting patient from effectively communicating at home and in community and safety when swallowing.Factors affecting potential to achieve goals and functional outcome are ability to learn/carryover information and co-morbidities.. Patient will benefit from skilled SLP services to address above impairments and  improve overall function.  REHAB POTENTIAL: Good  PLAN:  SLP FREQUENCY: 2x/week  SLP DURATION: 8 weeks  PLANNED INTERVENTIONS: Aspiration precaution training, Pharyngeal strengthening exercises, Diet toleration management , Environmental controls, Trials of upgraded texture/liquids, Cueing hierachy, Internal/external aids, Functional tasks, Multimodal communication approach, SLP instruction and feedback, Compensatory strategies, Patient/family education, and Re-evaluation    Magally Vahle, CCC-SLP 10/14/2022, 3:15 PM

## 2022-10-14 NOTE — Patient Instructions (Signed)
  WHEN YOU EAT: Small bites/sips Chin tuck! Follow solids with liquids Multiple dry swallows after each bite/sip Hard cough after swallowing Crush pills and put into puree

## 2022-10-15 NOTE — Therapy (Signed)
OUTPATIENT PHYSICAL THERAPY NEURO TREATMENT  Patient Name: Donald [name] MRN: [id number] DOB:11-Jan-1941, 82 y.o., male Today's Date: 10/16/2022   PCP: Gaspar Garbe, MD REFERRING PROVIDER: Vladimir Faster, DO  END OF SESSION:  PT End of Session - 10/16/22 0932     Visit Number 6    Number of Visits 12    Date for PT Re-Evaluation 11/09/22    Authorization Type [employer]    PT Start Time 0851    PT Stop Time 0931    PT Time Calculation (min) 40 min    Equipment Utilized During Treatment Gait belt    Activity Tolerance Patient tolerated treatment well    Behavior During Therapy Impulsive   improving with cues              Past Medical History:  Diagnosis Date   Abnormality of gait 08/27/2014   Anxiety    Aortic stenosis    s/p AVR by Dr Laneta Simmers   Arthritis    left wrist- probable- not diagonised   Asthma    in the past   CAD (coronary artery disease)    s/p CABG 2006   GERD (gastroesophageal reflux disease)    H/O seasonal allergies    H/O: rheumatic fever    Hearing deficit    Bilateral hearing aids   Hematuria    had CT scan   Hiatal hernia    History of kidney stones    HOH (hard of hearing)    Hyperlipidemia    Hypertension    Hyperthyroidism    Inguinal hernia    right side; watching at this time per wife's report   Memory difficulty 08/27/2014   Pacemaker    Parkinson's disease    Persistent atrial fibrillation (HCC)    Scarlet fever    as a child   Shortness of breath    Sick sinus syndrome (HCC)    s/p PPM (MDT) 2006   Past Surgical History:  Procedure Laterality Date   AORTIC VALVE REPLACEMENT  2006   CARDIOVERSION N/A 09/14/2013   Procedure: CARDIOVERSION;  Surgeon: Thurmon Fair, MD;  Location: [facility];  Service: Cardiovascular;  Laterality: N/A;   CHOLECYSTECTOMY N/A 07/21/2013   Procedure: LAPAROSCOPIC CHOLECYSTECTOMY;  Surgeon: Mariella Saa, MD;  Location: [facility] OR;  Service: General;  Laterality: N/A;    COLONOSCOPY W/ POLYPECTOMY     COLONOSCOPY WITH PROPOFOL N/A 08/31/2016   Procedure: COLONOSCOPY WITH PROPOFOL;  Surgeon: Charlott Rakes, MD;  Location: [facility] ENDOSCOPY;  Service: Endoscopy;  Laterality: N/A;   CORONARY ARTERY BYPASS GRAFT  2006   DENTAL SURGERY     implants   INSERT / REPLACE / REMOVE PACEMAKER  2006, 2012   MDT for sick sinus syndrome, gen change 8/12 by [facility]   KIDNEY STONE SURGERY  1984   Patient Active Problem List   Diagnosis Date Noted   HOH (hard of hearing) 12/18/2020   Personal history of colonic polyps 08/31/2016   Abnormality of gait 08/27/2014   Memory difficulty 08/27/2014   Permanent atrial fibrillation (HCC) 09/08/2013   Cholelithiases 07/19/2013   Cholelithiasis 07/19/2013   Encounter for therapeutic drug monitoring 07/03/2013   Mechanical aortic valve 04/05/2013   Parkinson disease 09/27/2012   Orthostatic hypotension 04/01/2012   Pacemaker-[employer] 03/29/2012   HEPATITIS A 06/04/2010   HYPERTHYROIDISM 06/04/2010   HYPERLIPIDEMIA 06/04/2010   Essential hypertension 06/04/2010   Coronary artery disease involving native coronary artery of native heart without angina pectoris 06/04/2010  SICK SINUS SYNDROME 06/04/2010   HIATAL HERNIA 06/04/2010   SHORTNESS OF BREATH 06/04/2010   CHEST PAIN 06/04/2010    ONSET DATE: 2014  REFERRING DIAG:  G20.B2 (ICD-10-CM) - Parkinson's disease with dyskinesia and fluctuating manifestations    THERAPY DIAG:  Muscle weakness (generalized)  Parkinson's disease with dyskinesia and fluctuating manifestations  History of falling  Unsteadiness on feet  Other symptoms and signs involving the nervous system  Rationale for Evaluation and Treatment: Rehabilitation  SUBJECTIVE:                                                                                                                                                                                             SUBJECTIVE STATEMENT: Doing okay.   Pt  accompanied by: self-spouse in lobby  PERTINENT HISTORY: (from recent PT screen)  Timed Up and Go test:  29.82 sec with U-step (compared to 21.41 sec at d/c)  10 meter walk test: 24.5 sec = 1.34 ft/sec (compared to 2.25 ft/sec at d/c)  5 time sit to stand test: 12 sec with UE support (compared 19.63 sec)  PAIN:  Are you having pain? No  PRECAUTIONS: Fall, HOH  WEIGHT BEARING RESTRICTIONS: No  FALLS: Has patient fallen in last 6 months? Yes. Number of falls numerous/often multiple per day  LIVING ENVIRONMENT: Lives with: lives with their spouse Lives in: House/apartment Stairs:  reports ramp access in/out Has following equipment at home: Dan Humphreys - 4 wheeled and U-step  PLOF: Requires assistive device for independence and Needs assistance with homemaking  PATIENT GOALS: Improve balance and walking  OBJECTIVE:      TODAY'S TREATMENT: 10/16/22 Activity Comments  Nustep L5 x 6 min UEs/LEs Maintaining 80-100 SPM  gait with U-step: forward/back walking, turns  Cues to "kick the laser" and "heels down" much improved step amplitude, speed, and control. Most difficulty with turns, backwards walk, and turning to sit down  seated PWR moves sitting in chair against wall with scarves in front of mirror Difficulty resetting back to middle after rock and twist- pt looking at PT for guidance rather than his hand. Difficulty with set switching (may try 1 side at a time next time). Performed only LE movement first with PWR step  Standing alt side step  Instruction to :only move if I move"       Access Code: [email or web address] URL: [email or web address] Date: 10/07/2022 Prepared by: [facility] - Outpatient  Rehab - [facility]  Exercises - Seated Hamstring Stretch  - 2 x daily - 5 x weekly - 1 sets - 3 reps - 10 sec hold - Seated Ankle Dorsiflexion AROM  -  1-2 x daily - 7 x weekly - 2 sets - 5 reps - 3 sec  hold    ----------------------------------------------------------------------------- Objective measures below taken at initial evaluation:  DIAGNOSTIC FINDINGS:   COGNITION: Overall cognitive status: No family/caregiver present to determine baseline cognitive functioning, HOH makes this determination more difficult   SENSATION: WFL  COORDINATION: Grossly impaired    MUSCLE TONE: ratcheting in BLE  MUSCLE LENGTH: Hamstrings: Right -20 deg; Left -20 deg     POSTURE: rounded shoulders and forward head  LOWER EXTREMITY ROM:     Active  Right Eval Left Eval  Hip flexion    Hip extension    Hip abduction    Hip adduction    Hip internal rotation    Hip external rotation    Knee flexion -10 -10  Knee extension    Ankle dorsiflexion 8 11  Ankle plantarflexion    Ankle inversion    Ankle eversion     (Blank rows = not tested)  LOWER EXTREMITY MMT:    Grossly 4/5 BLE  BED MOBILITY:  NT  TRANSFERS: Assistive device utilized: Environmental consultant - 4 wheeled  Sit to stand: Modified independence and CGA Stand to sit: Modified independence and SBA Chair to chair: Modified independence and CGA Floor:  NT  RAMP:  NT  CURB:  Level of Assistance: CGA Assistive device utilized: Environmental consultant - 4 wheeled Curb Comments:   STAIRS: NT  GAIT: Gait pattern: shuffling and festinating Distance walked:  Assistive device utilized:  U-step Level of assistance: CGA and Min A Comments:   FUNCTIONAL TESTS:  5 times sit to stand: 33 sec w/ retro-LOB, pushing backs of knees to chair, multiple attempts needed Timed up and go (TUG): 23 sec. TUG w/ cog: 32.91 sec 10 meter walk test: 18.12 sec = 1.8 ft/sec  Mini-BESTest: 7/28      PATIENT EDUCATION: Education details: assessment findings Person educated: Patient Education method: Explanation Education comprehension: verbalized understanding  HOME EXERCISE PROGRAM: TBD  GOALS: Goals reviewed with patient? Yes  SHORT TERM  GOALS: Target date: 10/19/2022    Patient will perform HEP with family/caregiver supervision for improved strength, balance, transfers, and gait  Baseline: Goal status: IN PROGRESS  2.  Manifest improved balance and BLE strength per 20 sec 5xSTS test without compensatory movements Baseline: 33 sec w/ compensations Goal status: IN PROGRESS    LONG TERM GOALS: Target date: 11/09/2022    Reduce risk for falls per score 18/28 Mini-BESTest Baseline: 7/28 Goal status: IN PROGRESS  2.  Demo improved efficiency and safety with ambulation maintaining speed of 2.25 ft/sec w/ least restrictive AD to improve community ambulation Baseline: 1.8 ft/sec Goal status: IN PROGRESS  3.  Demo improved safety with gait and negotiating turns per time of 15 sec TUG test Baseline: 23 sec w/ U-step Goal status: IN PROGRESS    ASSESSMENT:  CLINICAL IMPRESSION: Patient arrived to session without complaints. Worked on gait training with additional balance challenges and verbal cues to elicit more controlled and larger amplitude stepping. Patient demonstrated significant improvement in gait quality today. PWR moves in sitting were performed with hand boost to improve awareness of movement amplitude; patient with difficulty resetting at midline and set switching. Patient tolerated session well and without complaints upon leaving.    OBJECTIVE IMPAIRMENTS: Abnormal gait, decreased activity tolerance, decreased balance, decreased coordination, decreased endurance, decreased mobility, difficulty walking, decreased ROM, decreased strength, impaired flexibility, impaired tone, improper body mechanics, and postural dysfunction.   ACTIVITY LIMITATIONS: carrying, lifting,  bending, standing, transfers, reach over head, and locomotion level  PARTICIPATION LIMITATIONS: meal prep, cleaning, laundry, driving, shopping, community activity, and yard work  PERSONAL FACTORS: Age, Time since onset of  injury/illness/exacerbation, and 3+ comorbidities: PMH  are also affecting patient's functional outcome.   REHAB POTENTIAL: Good  CLINICAL DECISION MAKING: Evolving/moderate complexity  EVALUATION COMPLEXITY: Moderate  PLAN:  PT FREQUENCY: 1-2x/week  PT DURATION: 6 weeks  PLANNED INTERVENTIONS: Therapeutic exercises, Therapeutic activity, Neuromuscular re-education, Balance training, Gait training, Patient/Family education, Self Care, Joint mobilization, Stair training, Vestibular training, Canalith repositioning, DME instructions, Aquatic Therapy, Dry Needling, Electrical stimulation, Spinal mobilization, Cryotherapy, Moist heat, Taping, Traction, Ultrasound, Ionotophoresis 4mg /ml Dexamethasone, and Manual therapy  PLAN FOR NEXT SESSION: continue with seated PWR moves, seated exercises, standing march, kicks with cues for SLOWED motion; functional gait with U-step rollator-forward/back walking, turns    Anette Guarneri, PT, DPT 10/16/22 9:33 AM  [facility] at [facility] [address], [address] [place], [place] [address] Phone # [phone] Fax # [phone]

## 2022-10-16 ENCOUNTER — Ambulatory Visit: Payer: PPO | Admitting: Physical Therapy

## 2022-10-16 ENCOUNTER — Ambulatory Visit: Payer: PPO | Admitting: Occupational Therapy

## 2022-10-16 ENCOUNTER — Encounter: Payer: Self-pay | Admitting: Physical Therapy

## 2022-10-16 ENCOUNTER — Ambulatory Visit: Payer: PPO

## 2022-10-16 DIAGNOSIS — R278 Other lack of coordination: Secondary | ICD-10-CM

## 2022-10-16 DIAGNOSIS — R29818 Other symptoms and signs involving the nervous system: Secondary | ICD-10-CM

## 2022-10-16 DIAGNOSIS — R1312 Dysphagia, oropharyngeal phase: Secondary | ICD-10-CM

## 2022-10-16 DIAGNOSIS — G20B2 Parkinson's disease with dyskinesia, with fluctuations: Secondary | ICD-10-CM

## 2022-10-16 DIAGNOSIS — R2689 Other abnormalities of gait and mobility: Secondary | ICD-10-CM

## 2022-10-16 DIAGNOSIS — Z9181 History of falling: Secondary | ICD-10-CM

## 2022-10-16 DIAGNOSIS — R2681 Unsteadiness on feet: Secondary | ICD-10-CM

## 2022-10-16 DIAGNOSIS — R41841 Cognitive communication deficit: Secondary | ICD-10-CM

## 2022-10-16 DIAGNOSIS — R471 Dysarthria and anarthria: Secondary | ICD-10-CM

## 2022-10-16 DIAGNOSIS — M6281 Muscle weakness (generalized): Secondary | ICD-10-CM

## 2022-10-16 NOTE — Therapy (Signed)
OUTPATIENT SPEECH LANGUAGE PATHOLOGY TREATMENT   Patient Name: Donald [name] MRN: [id number] DOB:1940-07-17, 82 y.o., male Today's Date: 10/16/2022  PCP: Gaspar Garbe, MD REFERRING PROVIDER: Vladimir Faster, DO  END OF SESSION:  End of Session - 10/16/22 0819     Visit Number 6    Number of Visits 17    Date for SLP Re-Evaluation 11/23/22    SLP Start Time 0807    SLP Stop Time  0847    SLP Time Calculation (min) 40 min    Activity Tolerance Patient tolerated treatment well                Past Medical History:  Diagnosis Date   Abnormality of gait 08/27/2014   Anxiety    Aortic stenosis    s/p AVR by Dr Laneta Simmers   Arthritis    left wrist- probable- not diagonised   Asthma    in the past   CAD (coronary artery disease)    s/p CABG 2006   GERD (gastroesophageal reflux disease)    H/O seasonal allergies    H/O: rheumatic fever    Hearing deficit    Bilateral hearing aids   Hematuria    had CT scan   Hiatal hernia    History of kidney stones    HOH (hard of hearing)    Hyperlipidemia    Hypertension    Hyperthyroidism    Inguinal hernia    right side; watching at this time per wife's report   Memory difficulty 08/27/2014   Pacemaker    Parkinson's disease    Persistent atrial fibrillation (HCC)    Scarlet fever    as a child   Shortness of breath    Sick sinus syndrome (HCC)    s/p PPM (MDT) 2006   Past Surgical History:  Procedure Laterality Date   AORTIC VALVE REPLACEMENT  2006   CARDIOVERSION N/A 09/14/2013   Procedure: CARDIOVERSION;  Surgeon: Thurmon Fair, MD;  Location: [facility] ENDOSCOPY;  Service: Cardiovascular;  Laterality: N/A;   CHOLECYSTECTOMY N/A 07/21/2013   Procedure: LAPAROSCOPIC CHOLECYSTECTOMY;  Surgeon: Mariella Saa, MD;  Location: [facility] OR;  Service: General;  Laterality: N/A;   COLONOSCOPY W/ POLYPECTOMY     COLONOSCOPY WITH PROPOFOL N/A 08/31/2016   Procedure: COLONOSCOPY WITH PROPOFOL;  Surgeon: Charlott Rakes, MD;   Location: [facility] ENDOSCOPY;  Service: Endoscopy;  Laterality: N/A;   CORONARY ARTERY BYPASS GRAFT  2006   DENTAL SURGERY     implants   INSERT / REPLACE / REMOVE PACEMAKER  2006, 2012   MDT for sick sinus syndrome, gen change 8/12 by [facility]   KIDNEY STONE SURGERY  1984   Patient Active Problem List   Diagnosis Date Noted   HOH (hard of hearing) 12/18/2020   Personal history of colonic polyps 08/31/2016   Abnormality of gait 08/27/2014   Memory difficulty 08/27/2014   Permanent atrial fibrillation (HCC) 09/08/2013   Cholelithiases 07/19/2013   Cholelithiasis 07/19/2013   Encounter for therapeutic drug monitoring 07/03/2013   Mechanical aortic valve 04/05/2013   Parkinson disease 09/27/2012   Orthostatic hypotension 04/01/2012   Pacemaker-[employer] 03/29/2012   HEPATITIS A 06/04/2010   HYPERTHYROIDISM 06/04/2010   HYPERLIPIDEMIA 06/04/2010   Essential hypertension 06/04/2010   Coronary artery disease involving native coronary artery of native heart without angina pectoris 06/04/2010   SICK SINUS SYNDROME 06/04/2010   HIATAL HERNIA 06/04/2010   SHORTNESS OF BREATH 06/04/2010   CHEST PAIN 06/04/2010    ONSET DATE: referred  09/01/22  REFERRING DIAG: G20.B2 (ICD-10-CM) - Parkinson's disease with dyskinesia and fluctuating manifestations   THERAPY DIAG:  Dysarthria and anarthria  Cognitive communication deficit  Dysphagia, oropharyngeal phase  Rationale for Evaluation and Treatment: Rehabilitation  SUBJECTIVE:   SUBJECTIVE STATEMENT: Wife indicates she has to cue pt to follow swallow precautions. Pt accompanied by: self    PERTINENT HISTORY: Dx wtih PD 2012. MBSS in July 2020 revealed moderate dysphagia with significant pharyngeal residue, silent aspiration, reduce tongue base, epiglittic inversion, pharyngeal contraction, hocking and drool. Prior ST course 2022 at [facility].   PAIN:  Are you having pain? No  FALLS: Has patient fallen in last 6 months?  See PT evaluation for  details   PATIENT GOALS: swallow and talk better  OBJECTIVE:   DIAGNOSTIC FINDINGS:  MBSS 10/09/22 Clinical Impression: Pt exhibits mild oral and severe pharyngeal dysphagia which has mild-moderately progressed since MBS in 2022. Orally he was able to control and propel boluses with mild delays and lingual residue. He silently aspirated thin, nectar and puree consistencies due to decreased hyoid excursion, laryngeal elevation and laryngeal vestibular closure. Nectar thick was aspirated before laryngeal closure could be achieved. Cues for strong cough did not clear aspirates and only briefly cleared penetrates before barium reappeared on vocal cords.  Reduced tongue base retraction, decreased epiglottic deflection and pharyngeal stripping wave led to max vallecular and pyriform sinus residue with puree and nectar. Multiple swallows were ineffective to decrease residue however chin tuck did appear to minimally reduce residue. Chin tuck during the swallow was not effective to protect airway nor were right or left head turns. Options were discussed with pt and wife. His wishes are to continue eating and drinking and she states pt has not had pneumonia. Continued education re: risk if pt were to experience an illness or injury that risk of pneumona and decompnesation increases. Pt to continue regular texture,thin liquids, multiple swallows with chin tuck, hard coughs after swallows, alternate liquids and solids, remain upright 1 hour after meals and crush pills. Factors that may increase risk of adverse event in presence of aspiration [name] [employer] [name] 2021): Factors that may increase risk of adverse event in presence of aspiration [name] [employer] [name] 2021): Frail or deconditioned; Weak cough; Aspiration of thick, dense, and/or acidic materials; Frequent aspiration of large volumes Recommendations/Plan: Swallowing Evaluation Recommendations PO Diet Recommendation: Regular; Thin liquids (Level 0) Liquid  Administration via: Cup Medication Administration: Crushed with puree Supervision: Patient able to self-feed; Full supervision/cueing for swallowing strategies Swallowing strategies  : Slow rate; Small bites/sips; Chin tuck; Follow solids with liquids; Multiple dry swallows after each bite/sip; Hard cough after swallowing Postural changes: Stay upright 30-60 min after meals; Position pt fully upright for meals Oral care recommendations: Oral care BID (2x/day)  PATIENT REPORTED OUTCOME MEASURES (PROM): PROM was completed (CES) with a score of 13/32. Higher scores indicate greater effectiveness of communication.   TODAY'S TREATMENT:  DATE:  10/16/22: SPEECH: 8 minute simple conversation with pt - pt req'd usual mod A for speech intelligibility strategy usage -without pt using overarticulation and louder speech SLP understood 80%. SLP noted wife demonstrated understanding of pt 95% of the time. SWALLOW: Pt req'd initial consistent min-mod A for following precautions (except small bites/sips) with Dys III/thin, faded to usual min A for double swallows, cough, and alternate bite/sip.  10/14/22: SWALLOW: Pt with MBS 10/09/22. Results above. SLP strongly reiterated results of exam with pt/wife. SLP practiced recommendations with POs with pt, who required mod A usually for chin tuck, double swallows, and cough. Fading cues to min A occasionally for other precautions. Pt took small bites until end of session when SLP was not looking and then he took x2 bite size that he had been taking with SLP observing. SLP told pt to take smaller bites to mitigate risk of aspiration. SLP informed pt and wife that wife may need to assist pt during meals. SLP asked Meech if this was ok and he agreed. Pt is not performing speech intelligibilty exercises as directed - SLP is cautious to recommend  HEP for dysphagia, suspecting pt will also not complete as directed. SPEECH: He performed loud /a/ with consistent simultaneous production and usual mod cues for full breath before production. Held loud /a/ for 5 seconds average. [name] got out pt's sentences for him and he req'd consistent max cues for taking breath for each one and producing loud speech for each of them. SLP looked at pt's practice sheet and he is performing once/day. SLP strongly stressed BID completion and told pt and wife rationale.   10/07/22: Pt told SLP MBS scheduled for Friday 10/09/22. Arrived with "wet" voice and took x3 attempts to clear. Once cleared, pt did not have to clear again for remainder of session. SLP provided max A for /a/ x10 - simultaneous production needed. SLP provided max A to slow down and read sentences using one breath per sentence, and shout sentences. SLP worked remainder of session with consistent mod cues to "shout" when pt speaks in sentence responses. When doing so, SLP understood [name] 75% of the time, however even with a cue to shout, [place] only used incr'd volume and articulation 70% of the time. When not "shouting", SLP understood [name] ~20% of the time.  Pt had reportedly completed exercises twice yesterday.   10/05/22: SLP cont to require to use very loud speech volume for pt to demonstrate understanding of SLP speech. Since last session pt has completed /a/ and sentences once/day, with two days BID. SLP told pt to complete every day BID.  Simultaneous production necessary for loud /a/ - otherwise pt with loudness decay after 2-3 seconds. SLP told [name]) she may need to complete loud /a/ with pt based upon today's production/s. SLP wonders the effectiveness of pt's practice thus far, if wife is not assisting pt. SLP cont to remind pt throughout session to "shout, so we can understand you" and that pt normal speech is mumbling in nature and this cannot be understood. Pt benefited from  occasional cues to incr volume for the remaining time throughout the session (14 minutes) to "shout". [name] endorsed it was easier to hear pt during session today than it is at home, and especially in the car. SLP told [name] to use "shout" as a cue for pt at home and also in the car, to see if this improves pt's loudness/clarity.  09/30/22: Pt wife waiting on a call back  from scheduler for MBSS. SLP required to provide a very loud speaking volume for pt to respond, or ask SLP for repeat. [name] stated pt had hearing aids serviced "about a month ago." SLP may suggest return to hearing aid dispenser for service/checkup. Max cues needed for loud /a/ including modeling. Pt average loudness upper 80s dB with average 6 seconds, even with max cues for longer production. SLP told pt 10 reps, BID. SLP educated pt/wife that pt and wife should generate 10 everyday sentences to practice at home BID after the loud /a/ reps. When SLP inquired how consistent pt is with performing other HEPs in the past [name] stated, "He isn't too good with doing (the HEP) at home." SLP reiterated that [place] must do HEP at least 5 days/week for there to be any possibility of progress with speech intelligibility and/or with swallow strength.SLP provided pt/wife with HEP checklist for completion with "ahhhhhh" and "everyday sentences" slots, to encourage HEP completion. At the end of session wife provided [place] with a sip of water and pt immediately coughed after an audible swallow.  09/28/22: Initiated training on dysarthria strategies: slow, loud, over-articulate, with practice at phrase reading and generative levels. Direct model required to optimize production and improve intelligibility. Education on POC and goals, to which pt and spouse verbalize agreement.   PATIENT EDUCATION: Education details: see above Person educated: Patient and Spouse Education method: Explanation, Demonstration, Verbal cues, and Handouts Education  comprehension: verbalized understanding, returned demonstration, verbal cues required, and needs further education  HOME EXERCISE PROGRAM: Sustained "ah," oral reading   GOALS: Goals reviewed with patient? Yes  SHORT TERM GOALS: Target date: 10/26/2022  Pt will participate in MBSS Baseline: Goal status: MET  2.  Pt will demonstrate use of swallow compensations with use of visual aid and occasional verbal cues during structured practice Baseline:  Goal status: IN PROGRESS  3.  Pt will use dysarthria strategies, resulting in intelligible production of 15/20 sentences  Baseline:  Goal status: IN PROGRESS  4.  Pt will demonstrate dysphagia exercises with mod-A over 2 sessions  Baseline:  Goal status: IN PROGRESS   LONG TERM GOALS: Target date: 11/23/2022  Pt will complete dysphagia and dysarthria HEP 5/7 days over 1 week period Baseline:  Goal status: IN PROGRESS  2.  Pt and spouse will report carryover of swallow compensations and strategies at home Baseline:  Goal status: IN PROGRESS  3.  Pt will use dysarthria strategies during 10 minute conversation, resulting in 80% intelligibility  Baseline:  Goal status: IN PROGRESS  ASSESSMENT:  CLINICAL IMPRESSION: Patient is a 82 y.o. M who was seen today for moderate dysarthria, c/b low vocal intensity and imprecise articulation which results in significantly decreased intelligibility. Pt would benefit from cont'd skilled ST to address aforementioned deficits to reduce caregiver burden , enhance communication efficacy, and facilitate improved swallow function/safety . PT had MBSS last week and above recommendations were initiated today in session. SLP pondering feasibility of a dysphagia HEP when pt is not completing dysarthria HEP as directed by SLP , in order to achieve progress.  OBJECTIVE IMPAIRMENTS: Objective impairments include attention, memory, executive functioning, dysarthria, and dysphagia. These impairments are  limiting patient from effectively communicating at home and in community and safety when swallowing.Factors affecting potential to achieve goals and functional outcome are ability to learn/carryover information and co-morbidities.. Patient will benefit from skilled SLP services to address above impairments and improve overall function.  REHAB POTENTIAL: Good  PLAN:  SLP FREQUENCY: 2x/week  SLP DURATION: 8 weeks  PLANNED INTERVENTIONS: Aspiration precaution training, Pharyngeal strengthening exercises, Diet toleration management , Environmental controls, Trials of upgraded texture/liquids, Cueing hierachy, Internal/external aids, Functional tasks, Multimodal communication approach, SLP instruction and feedback, Compensatory strategies, Patient/family education, and Re-evaluation    Caralee Morea, CCC-SLP 10/16/2022, 8:19 AM

## 2022-10-16 NOTE — Therapy (Signed)
OUTPATIENT PHYSICAL THERAPY NEURO TREATMENT  Patient Name: Donald [name] MRN: [id number] DOB:October 19, 1940, 82 y.o., male Today's Date: 10/19/2022   PCP: Gaspar Garbe, MD REFERRING PROVIDER: Vladimir Faster, DO  END OF SESSION:  PT End of Session - 10/19/22 1531     Visit Number 7    Number of Visits 12    Date for PT Re-Evaluation 11/09/22    Authorization Type [employer]    PT Start Time 1448    PT Stop Time 1527    PT Time Calculation (min) 39 min    Equipment Utilized During Treatment Gait belt    Activity Tolerance Patient tolerated treatment well    Behavior During Therapy Impulsive   improving with cues               Past Medical History:  Diagnosis Date   Abnormality of gait 08/27/2014   Anxiety    Aortic stenosis    s/p AVR by Dr Laneta Simmers   Arthritis    left wrist- probable- not diagonised   Asthma    in the past   CAD (coronary artery disease)    s/p CABG 2006   GERD (gastroesophageal reflux disease)    H/O seasonal allergies    H/O: rheumatic fever    Hearing deficit    Bilateral hearing aids   Hematuria    had CT scan   Hiatal hernia    History of kidney stones    HOH (hard of hearing)    Hyperlipidemia    Hypertension    Hyperthyroidism    Inguinal hernia    right side; watching at this time per wife's report   Memory difficulty 08/27/2014   Pacemaker    Parkinson's disease    Persistent atrial fibrillation (HCC)    Scarlet fever    as a child   Shortness of breath    Sick sinus syndrome (HCC)    s/p PPM (MDT) 2006   Past Surgical History:  Procedure Laterality Date   AORTIC VALVE REPLACEMENT  2006   CARDIOVERSION N/A 09/14/2013   Procedure: CARDIOVERSION;  Surgeon: Thurmon Fair, MD;  Location: [facility] ENDOSCOPY;  Service: Cardiovascular;  Laterality: N/A;   CHOLECYSTECTOMY N/A 07/21/2013   Procedure: LAPAROSCOPIC CHOLECYSTECTOMY;  Surgeon: Mariella Saa, MD;  Location: [facility] OR;  Service: General;  Laterality: N/A;    COLONOSCOPY W/ POLYPECTOMY     COLONOSCOPY WITH PROPOFOL N/A 08/31/2016   Procedure: COLONOSCOPY WITH PROPOFOL;  Surgeon: Charlott Rakes, MD;  Location: [facility] ENDOSCOPY;  Service: Endoscopy;  Laterality: N/A;   CORONARY ARTERY BYPASS GRAFT  2006   DENTAL SURGERY     implants   INSERT / REPLACE / REMOVE PACEMAKER  2006, 2012   MDT for sick sinus syndrome, gen change 8/12 by [facility]   KIDNEY STONE SURGERY  1984   Patient Active Problem List   Diagnosis Date Noted   HOH (hard of hearing) 12/18/2020   Personal history of colonic polyps 08/31/2016   Abnormality of gait 08/27/2014   Memory difficulty 08/27/2014   Permanent atrial fibrillation (HCC) 09/08/2013   Cholelithiases 07/19/2013   Cholelithiasis 07/19/2013   Encounter for therapeutic drug monitoring 07/03/2013   Mechanical aortic valve 04/05/2013   Parkinson disease 09/27/2012   Orthostatic hypotension 04/01/2012   Pacemaker-[employer] 03/29/2012   HEPATITIS A 06/04/2010   HYPERTHYROIDISM 06/04/2010   HYPERLIPIDEMIA 06/04/2010   Essential hypertension 06/04/2010   Coronary artery disease involving native coronary artery of native heart without angina pectoris 06/04/2010  SICK SINUS SYNDROME 06/04/2010   HIATAL HERNIA 06/04/2010   SHORTNESS OF BREATH 06/04/2010   CHEST PAIN 06/04/2010    ONSET DATE: 2014  REFERRING DIAG:  G20.B2 (ICD-10-CM) - Parkinson's disease with dyskinesia and fluctuating manifestations    THERAPY DIAG:  Other symptoms and signs involving the nervous system  Other abnormalities of gait and mobility  Unsteadiness on feet  Rationale for Evaluation and Treatment: Rehabilitation  SUBJECTIVE:                                                                                                                                                                                             SUBJECTIVE STATEMENT: "I'm fine."  Pt accompanied by: self-spouse in lobby  PERTINENT HISTORY: (from recent PT  screen)  Timed Up and Go test:  29.82 sec with U-step (compared to 21.41 sec at d/c)  10 meter walk test: 24.5 sec = 1.34 ft/sec (compared to 2.25 ft/sec at d/c)  5 time sit to stand test: 12 sec with UE support (compared 19.63 sec)  PAIN:  Are you having pain? No  PRECAUTIONS: Fall, HOH  WEIGHT BEARING RESTRICTIONS: No  FALLS: Has patient fallen in last 6 months? Yes. Number of falls numerous/often multiple per day  LIVING ENVIRONMENT: Lives with: lives with their spouse Lives in: House/apartment Stairs:  reports ramp access in/out Has following equipment at home: Dan Humphreys - 4 wheeled and U-step  PLOF: Requires assistive device for independence and Needs assistance with homemaking  PATIENT GOALS: Improve balance and walking  OBJECTIVE:     TODAY'S TREATMENT: 10/19/22 Activity Comments  Nustep L5 x 6 min UEs/LEs  For neural priming, focusing on large reciprocal movements   STS from elevated seat, hands on knees 15x Then also at normal mat height. Consistent cueing to slow down, set up with scooting forward, tucking feet under, standing fully upright   bed mobility: supine> roll > prone > quadruped Prone reaches to PT's hand quadruped PWR rock  quadruped reach to colored targets  Some difficulty getting into prone and required min A for positioning.   Quadruped to sit Provided colored dot to bring R knee down onto   Lateral scooting on EOM Cueing to lean forward to off weight bottom, scoot bottom to one side, then step feet over   Gait training with U step practicing turns 163ft Cues to STOP, heels down, large steps particularly during turns        Access Code: [email or web address] URL: [email or web address] Date: 10/07/2022 Prepared by: [facility] Outpatient  Rehab - [facility]  Exercises - Seated Hamstring Stretch  - 2 x daily -  5 x weekly - 1 sets - 3 reps - 10 sec hold - Seated Ankle Dorsiflexion AROM  - 1-2 x daily - 7 x weekly - 2 sets - 5 reps - 3 sec  hold    ----------------------------------------------------------------------------- Objective measures below taken at initial evaluation:  DIAGNOSTIC FINDINGS:   COGNITION: Overall cognitive status: No family/caregiver present to determine baseline cognitive functioning, HOH makes this determination more difficult   SENSATION: WFL  COORDINATION: Grossly impaired    MUSCLE TONE: ratcheting in BLE  MUSCLE LENGTH: Hamstrings: Right -20 deg; Left -20 deg     POSTURE: rounded shoulders and forward head  LOWER EXTREMITY ROM:     Active  Right Eval Left Eval  Hip flexion    Hip extension    Hip abduction    Hip adduction    Hip internal rotation    Hip external rotation    Knee flexion -10 -10  Knee extension    Ankle dorsiflexion 8 11  Ankle plantarflexion    Ankle inversion    Ankle eversion     (Blank rows = not tested)  LOWER EXTREMITY MMT:    Grossly 4/5 BLE  BED MOBILITY:  NT  TRANSFERS: Assistive device utilized: Environmental consultant - 4 wheeled  Sit to stand: Modified independence and CGA Stand to sit: Modified independence and SBA Chair to chair: Modified independence and CGA Floor:  NT  RAMP:  NT  CURB:  Level of Assistance: CGA Assistive device utilized: Environmental consultant - 4 wheeled Curb Comments:   STAIRS: NT  GAIT: Gait pattern: shuffling and festinating Distance walked:  Assistive device utilized:  U-step Level of assistance: CGA and Min A Comments:   FUNCTIONAL TESTS:  5 times sit to stand: 33 sec w/ retro-LOB, pushing backs of knees to chair, multiple attempts needed Timed up and go (TUG): 23 sec. TUG w/ cog: 32.91 sec 10 meter walk test: 18.12 sec = 1.8 ft/sec  Mini-BESTest: 7/28      PATIENT EDUCATION: Education details: assessment findings Person educated: Patient Education method: Explanation Education comprehension: verbalized understanding  HOME EXERCISE PROGRAM: TBD  GOALS: Goals reviewed with patient? Yes  SHORT TERM  GOALS: Target date: 10/19/2022    Patient will perform HEP with family/caregiver supervision for improved strength, balance, transfers, and gait  Baseline: Goal status: IN PROGRESS  2.  Manifest improved balance and BLE strength per 20 sec 5xSTS test without compensatory movements Baseline: 33 sec w/ compensations Goal status: IN PROGRESS    LONG TERM GOALS: Target date: 11/09/2022    Reduce risk for falls per score 18/28 Mini-BESTest Baseline: 7/28 Goal status: IN PROGRESS  2.  Demo improved efficiency and safety with ambulation maintaining speed of 2.25 ft/sec w/ least restrictive AD to improve community ambulation Baseline: 1.8 ft/sec Goal status: IN PROGRESS  3.  Demo improved safety with gait and negotiating turns per time of 15 sec TUG test Baseline: 23 sec w/ U-step Goal status: IN PROGRESS    ASSESSMENT:  CLINICAL IMPRESSION: Patient arrived to session without complaints. Worked on STS transfers from elevated seat height, with consistent cues required for proper set up. Patient also able to perform at normal mat height but with limited carryover of cues provided. Performed bed mobility and weight shifting activities in prone and quadruped with good tolerance and ability. Did demo some difficulty transitioning from quadruped to sit and when side scooting, which response to cues. Gait training at end of session focused on addressing freezing with turns. No complaints at  end of session.   OBJECTIVE IMPAIRMENTS: Abnormal gait, decreased activity tolerance, decreased balance, decreased coordination, decreased endurance, decreased mobility, difficulty walking, decreased ROM, decreased strength, impaired flexibility, impaired tone, improper body mechanics, and postural dysfunction.   ACTIVITY LIMITATIONS: carrying, lifting, bending, standing, transfers, reach over head, and locomotion level  PARTICIPATION LIMITATIONS: meal prep, cleaning, laundry, driving, shopping, community  activity, and yard work  PERSONAL FACTORS: Age, Time since onset of injury/illness/exacerbation, and 3+ comorbidities: PMH  are also affecting patient's functional outcome.   REHAB POTENTIAL: Good  CLINICAL DECISION MAKING: Evolving/moderate complexity  EVALUATION COMPLEXITY: Moderate  PLAN:  PT FREQUENCY: 1-2x/week  PT DURATION: 6 weeks  PLANNED INTERVENTIONS: Therapeutic exercises, Therapeutic activity, Neuromuscular re-education, Balance training, Gait training, Patient/Family education, Self Care, Joint mobilization, Stair training, Vestibular training, Canalith repositioning, DME instructions, Aquatic Therapy, Dry Needling, Electrical stimulation, Spinal mobilization, Cryotherapy, Moist heat, Taping, Traction, Ultrasound, Ionotophoresis 4mg /ml Dexamethasone, and Manual therapy  PLAN FOR NEXT SESSION: continue with seated PWR moves, seated exercises, standing march, kicks with cues for SLOWED motion; functional gait with U-step rollator-forward/back walking, turns    Anette Guarneri, PT, DPT 10/19/22 3:32 PM   Outpatient Rehab at [facility] [address] [place], [place] [address] Phone # [phone] Fax # [phone]

## 2022-10-16 NOTE — Patient Instructions (Signed)
In-Hand Manipulation Skills Rotation:  Hold pen, try to "twirl" like a baton, keeping flat with surface of table. Try going BOTH directions 10x For increased challenge, can complete with coin.  Flip:  Hold pen in writing position, flip in an arch to "erase" position, then back to "write" position. Do not lift hand off table.  10x   Translation:  Open hand palm up, put an object in your palm and then use your fingers and thumb to move it to the tips of your fingers, pinched against your thumb.  10x (Bigger is easier (fat marker), smaller is harder (penny))  Shift:  Hold pen like a dart, start "shifting" it forward until you are holding it at the base, then shift it backwards until you are holding it at the tip again. 10x (like putting a key in a key hole)  

## 2022-10-16 NOTE — Therapy (Signed)
OUTPATIENT OCCUPATIONAL THERAPY PARKIN[facility]  Treatment Note  Patient Name: Donald [name] MRN: [id number] DOB:November 18, 1940, 82 y.o., male Today's Date: 10/16/2022  PCP: Gaspar Garbe, MD REFERRING PROVIDER: Vladimir Faster, DO  END OF SESSION:  OT End of Session - 10/16/22 1022     Visit Number 6    Number of Visits 17    Date for OT Re-Evaluation 11/27/22    Authorization Type [employer]    OT Start Time 1018    OT Stop Time 1100    OT Time Calculation (min) 42 min    Activity Tolerance No increased pain;Patient tolerated treatment well    Behavior During Therapy [facility] for tasks assessed/performed;Impulsive             Past Medical History:  Diagnosis Date   Abnormality of gait 08/27/2014   Anxiety    Aortic stenosis    s/p AVR by Dr Laneta Simmers   Arthritis    left wrist- probable- not diagonised   Asthma    in the past   CAD (coronary artery disease)    s/p CABG 2006   GERD (gastroesophageal reflux disease)    H/O seasonal allergies    H/O: rheumatic fever    Hearing deficit    Bilateral hearing aids   Hematuria    had CT scan   Hiatal hernia    History of kidney stones    HOH (hard of hearing)    Hyperlipidemia    Hypertension    Hyperthyroidism    Inguinal hernia    right side; watching at this time per wife's report   Memory difficulty 08/27/2014   Pacemaker    Parkinson's disease    Persistent atrial fibrillation (HCC)    Scarlet fever    as a child   Shortness of breath    Sick sinus syndrome (HCC)    s/p PPM (MDT) 2006   Past Surgical History:  Procedure Laterality Date   AORTIC VALVE REPLACEMENT  2006   CARDIOVERSION N/A 09/14/2013   Procedure: CARDIOVERSION;  Surgeon: Thurmon Fair, MD;  Location: [facility] ENDOSCOPY;  Service: Cardiovascular;  Laterality: N/A;   CHOLECYSTECTOMY N/A 07/21/2013   Procedure: LAPAROSCOPIC CHOLECYSTECTOMY;  Surgeon: Mariella Saa, MD;  Location: [facility] OR;  Service: General;  Laterality: N/A;   COLONOSCOPY W/  POLYPECTOMY     COLONOSCOPY WITH PROPOFOL N/A 08/31/2016   Procedure: COLONOSCOPY WITH PROPOFOL;  Surgeon: Charlott Rakes, MD;  Location[facility] ENDOSCOPY;  Service: Endoscopy;  Laterality: N/A;   CORONARY ARTERY BYPASS GRAFT  2006   DENTAL SURGERY     implants   INSERT / REPLACE / REMOVE PACEMAKER  2006, 2012   MDT for sick sinus syndrome, gen change 8/12 by [facility]   KIDNEY STONE SURGERY  1984   Patient Active Problem List   Diagnosis Date Noted   HOH (hard of hearing) 12/18/2020   Personal history of colonic polyps 08/31/2016   Abnormality of gait 08/27/2014   Memory difficulty 08/27/2014   Permanent atrial fibrillation (HCC) 09/08/2013   Cholelithiases 07/19/2013   Cholelithiasis 07/19/2013   Encounter for therapeutic drug monitoring 07/03/2013   Mechanical aortic valve 04/05/2013   Parkinson disease 09/27/2012   Orthostatic hypotension 04/01/2012   Pacemaker-[employer] 03/29/2012   HEPATITIS A 06/04/2010   HYPERTHYROIDISM 06/04/2010   HYPERLIPIDEMIA 06/04/2010   Essential hypertension 06/04/2010   Coronary artery disease involving native coronary artery of native heart without angina pectoris 06/04/2010   SICK SINUS SYNDROME 06/04/2010   HIATAL HERNIA 06/04/2010  SHORTNESS OF BREATH 06/04/2010   CHEST PAIN 06/04/2010    ONSET DATE: referral date 09/01/22 (PD diagnosis 2014)  REFERRING DIAG: G20.B2 (ICD-10-CM) - Parkinson's disease with dyskinesia and fluctuating manifestations  THERAPY DIAG:  Other symptoms and signs involving the nervous system  Other abnormalities of gait and mobility  Unsteadiness on feet  Other lack of coordination  Rationale for Evaluation and Treatment: Rehabilitation  SUBJECTIVE:   SUBJECTIVE STATEMENT: Pt reports playing a card game with 10 in the name (garbled speech unable to decipher).  Pt accompanied by: self & spouse [name] in waiting area   PERTINENT HISTORY: Parkinson's Disease. PMH: hx of falls (wears helment), arthritis, asthma,  CAD, GERD, HOH, HLD, HTN, hyperthyroidism, anxiety, aortic stenosis, hx of aortic valve replacement 2006, memory difficulty, pacemaker, persistent a-fib, hx of scarlet fever, shortness of breath, hx of CABG 2006, cholecystectomy 2015, neuropathy  PRECAUTIONS: Fall; WEIGHT BEARING RESTRICTIONS: No  PAIN:  Are you having pain? None today reported  FALLS: Has patient fallen in last 6 months? Yes. Number of falls fall frequently, reports falling 3x today  LIVING ENVIRONMENT: Lives with: lives with their spouse Lives in: House/apartment Stairs:  ramped entrance Has following equipment at home: Grab bars, Ramped entry, and U-step walker  PLOF: Independent with basic ADLs and Requires assistive device for independence  PATIENT GOALS: to work on his balance   OBJECTIVE:  (All objective assessments below are from initial evaluation on: 09/28/22 unless otherwise specified.)   HAND DOMINANCE: Left  ADLs: Overall ADLs: Pt reports it takes about 30 mins to get dressed, but nobody helps him with that.  Reports that he does not need any help with any bathroom transfers.  He utilizes a hook on pants button instead of buttoning and wears step in shoes to eliminate tying shoes. Transfers/ambulation related to ADLs: Utilizes U-step walker  Equipment: Grab bars and Walk in shower  IADLs: Light housekeeping: changes bed linen Medication management: wife administers Handwriting: 75% legible, shaky  MOBILITY STATUS: Hx of falls and ambulates with U-step walker and falls frequently  POSTURE COMMENTS:  rounded shoulders and forward head  FUNCTIONAL OUTCOME MEASURES: Fastening/unfastening 3 buttons: 5:16.46 (Pt utilizing all fingers intermittently to attempt to fasten buttons) Physical performance test: PPT#2 (simulated eating) 22.93 sec & PPT#4 (donning/doffing jacket): 2:41.1 (attempted for 45 seconds with inability to thread 2nd arm, therefore switched arms)  COORDINATION: 9 Hole Peg test: Right:  2.43.9; Left: 3:47.9 (pt dropping multiple pegs and benefiting from picking up from table top by sliding off edge of table)  10/16/22: 9 hole peg test: R: 4:58 (pt dropping multiple pegs and transferring them from finger tips to palm and then attempting to place with gross grasp); L: 6:07 09/30/22: Box and blocks: L: 28 blocks, R: 37 blocks  UE ROM:   R shoulder flexion 110*, L shoulder flexion 110*  SENSATION: "Feels like ice"  COGNITION: Overall cognitive status: No family/caregiver present to determine baseline cognitive functioning and HOH makes this determination more difficult  OBSERVATIONS:    10/14/22: today he presents less impulsive than last session- more sedate, quite and slow. More typical of PD.  He is still toe-walking.    TODAY'S TREATMENT:  10/16/22 Connect 4: engaged in picking up and placing connect 4 pieces into grid with each hand incorporating midrange reach for increased elbow extension with mod cueing for large amplitude movements to open hand. Pt demonstrating decreased hypokinesia with repetition and setup of task. Coordination: as pt demonstrating improved motor control and grasp with Connect 4 attempted 9 hole peg test bilaterally.  Pt requiring significantly increased time to complete.  Noted frequent dropping of pegs and dropping from precision pinch into hand - therefore educated pt on translation from palm <> finger tips with marker and use of Connect 4 piece.  Pt able to complete with increased time and effort, but with repetition demonstrating some carryover.     10/14/22: His wife states that he is stubborn and won't listen to her, also that most of his falls are from going outside on gravel and trying to get on his tractor. He does cognitive activity with OT (SLUMS TEST) to help determine a baseline for cognition and he scores 14/30 points  indicating "dementia level" cognition.  (OT did try to ensure that he could hear and reapeat back info, though very poor hearing could have had at least a mild effect.)  His wife is cautioned that this may mean he is not safe to be left alone, especially as he has hx of repeatedly/impulsively falling from tractor after trying to get back on. It shows his poor safety awareness, etc. For his self-care, safety she is recommended to not left him try high risk activities himself. Next, he does a building activity for FMS and direction following to build a replica simple model.  He needs min A cues to complete correctly. He also does seated catch/throw for UE fnl reaching activity/timing/coordination.   He did not bring finger orthosis to be checked- his wife is informed about it's purpose.  She does leave the session 1/2 way through, as she fels her presence distracts him.  It seems like he is more dependent on her, in OT opinion (due to hearing and cognition).    10/07/22: Due to Lt RF flexion contracture, OT fabricates static progressive DIP J ext orthosis and it fits well, he feels a non-painful stretch. He is advised to use only in the night as tolerated to help correct deformity.   Next, he states his biggest issues are falling and reaching up high, so OT works on functional transfers and safety cues with walking with him. He is cued to "slow down" and take bigger steps, not on the tips of his toes but on flat feet.  He seems to respond to this but is somewhat impulsive and hard to understand at times. For "reaching up high," he is given the following HEP to try 2-3 x day at home, seated and safe. He demo's them back well without pain and moving fairly well- just fast and impulsive. Perhaps this comes from history of falls and head trauma. He states mowing his lawn or paying someone and OT suggests downsizing to a less rigorous living environment, perhaps (he has 3 acres in [place]). Wife is not present and  he can think of no other goals or functional concerns, so session is ended at that point. He goes to PT.   Exercises - Seated Shoulder Flexion Towel Slide at Table Top  - 2-3 x daily - 3-5 reps - 15 hold - Seated Shoulder Flexion AAROM with Dowel  - 2-3 x daily - 1 sets - 10-15 reps - Seated Shoulder Abduction AAROM with  Dowel  - 2-3 x daily - 1 sets - 10-15 reps  PATIENT EDUCATION: Education details: Educated on role and purpose of OT as well as potential interventions and goals for therapy based on initial evaluation findings. Person educated: Patient and Spouse Education method: Explanation Education comprehension: verbalized understanding and needs further education  HOME EXERCISE PROGRAM: Access Code: [email or web address] URL: [email or web address] Date: 10/07/2022 Prepared by: Fannie Knee   GOALS: Goals reviewed with patient? Yes  SHORT TERM GOALS: Target date: 10/30/22  Pt will perform PD-specific HEP with cueing prn from caregiver  Baseline: Goal status: IN PROGRESS  2.  Pt will improve R hand coordination for ADLs (buttoning/tying shoes) as shown by completing 9-hole peg test in 2 min or less  Baseline: Right: 2.43.9; Left: 3:47.9  Goal status: IN PROGRESS  3.  Pt will improve L hand coordination for ADLs (buttoning/tying shoes) as shown by completing 9-hole peg test in 3 mins or less  Baseline: Right: 2.43.9; Left: 3:47.9  Goal status: IN PROGRESS  4.  Pt/caregiver will verbalize understanding of ways to decr risk of future complcations related to PD (including falls).  Baseline:  Goal status: IN PROGRESS   LONG TERM GOALS: Target date: 11/27/22  Pt/caregiver will verbalize understanding of AE/strategies to increase ease, safety, and independence with ADLs/IADLs  Baseline:  Goal status: IN PROGRESS  2.  Pt will improve functional reaching/coordination for ADLs as shown by improving score on box and blocks test by at least 5 with dominant LUE  Baseline:   L: 28 blocks, R: 37 blocks Goal status: IN PROGRESS  3.  Pt will complete 3 button/unbutton in 4 mins or less with use of AE PRN to demonstrate increased coordination and motor control to manage clothing fasteners. Baseline:  5:16.46  Goal status: IN PROGRESS  4.  Pt will demonstrate improved ease with feeding as evidenced by decreasing PPT#2 (self feeding) by 3 secs Baseline: PPT#2 (simulated eating) 22.93 sec  Goal status: IN PROGRESS  5.  Pt will demonstrate ability to retrieve a lightweight object at moderate range to increase shoulder flexion. Baseline:  Goal status: IN PROGRESS  ASSESSMENT:  CLINICAL IMPRESSION: Pt with hyperfocus on tasks once he initiates, attempted to redirect pt from 9 hole peg test when noted increased time this session.  OT educating on translation exercise with marker and Connect 4 piece to continue to address manipulation of items to aid in engagement in fine and gross motor control tasks.   PLAN:  OT FREQUENCY: 2x/week  OT DURATION: 8 weeks  PLANNED INTERVENTIONS: self care/ADL training, therapeutic exercise, therapeutic activity, neuromuscular re-education, passive range of motion, balance training, functional mobility training, ultrasound, compression bandaging, moist heat, cryotherapy, patient/family education, cognitive remediation/compensation, psychosocial skills training, energy conservation, coping strategies training, and DME and/or AE instructions  CONSULTED AND AGREED WITH PLAN OF CARE: Patient and family member/caregiver  PLAN FOR NEXT SESSION: Focus on cognitive/safety tasks, as well as FMS and also standing balance/coordination activities.    Rosalio Loud, OTR/L 10/16/2022, 10:25 AM

## 2022-10-19 ENCOUNTER — Ambulatory Visit: Payer: PPO | Admitting: Physical Therapy

## 2022-10-19 ENCOUNTER — Encounter: Payer: Self-pay | Admitting: Physical Therapy

## 2022-10-19 ENCOUNTER — Ambulatory Visit: Payer: PPO

## 2022-10-19 ENCOUNTER — Ambulatory Visit: Payer: PPO | Attending: Neurology | Admitting: Occupational Therapy

## 2022-10-19 DIAGNOSIS — R1312 Dysphagia, oropharyngeal phase: Secondary | ICD-10-CM | POA: Insufficient documentation

## 2022-10-19 DIAGNOSIS — G20B2 Parkinson's disease with dyskinesia, with fluctuations: Secondary | ICD-10-CM | POA: Insufficient documentation

## 2022-10-19 DIAGNOSIS — R471 Dysarthria and anarthria: Secondary | ICD-10-CM | POA: Insufficient documentation

## 2022-10-19 DIAGNOSIS — R2681 Unsteadiness on feet: Secondary | ICD-10-CM | POA: Insufficient documentation

## 2022-10-19 DIAGNOSIS — Z9181 History of falling: Secondary | ICD-10-CM | POA: Insufficient documentation

## 2022-10-19 DIAGNOSIS — R278 Other lack of coordination: Secondary | ICD-10-CM | POA: Diagnosis not present

## 2022-10-19 DIAGNOSIS — R41841 Cognitive communication deficit: Secondary | ICD-10-CM | POA: Diagnosis not present

## 2022-10-19 DIAGNOSIS — R29818 Other symptoms and signs involving the nervous system: Secondary | ICD-10-CM | POA: Diagnosis not present

## 2022-10-19 DIAGNOSIS — M6281 Muscle weakness (generalized): Secondary | ICD-10-CM | POA: Diagnosis not present

## 2022-10-19 DIAGNOSIS — R2689 Other abnormalities of gait and mobility: Secondary | ICD-10-CM | POA: Diagnosis not present

## 2022-10-19 NOTE — Therapy (Signed)
OUTPATIENT SPEECH LANGUAGE PATHOLOGY TREATMENT   Patient Name: Donald [name] MRN: [id number] DOB:[birth year], 82 y.o., male Today's Date: 10/19/2022  PCP: Gaspar Garbe, MD REFERRING PROVIDER: Vladimir Faster, DO  END OF SESSION:  End of Session - 10/19/22 2031     Visit Number 7    Number of Visits 17    Date for SLP Re-Evaluation 11/23/22    SLP Start Time 1403    SLP Stop Time  1445    SLP Time Calculation (min) 42 min    Activity Tolerance Patient tolerated treatment well                 Past Medical History:  Diagnosis Date   Abnormality of gait 08/27/2014   Anxiety    Aortic stenosis    s/p AVR by Dr Laneta Simmers   Arthritis    left wrist- probable- not diagonised   Asthma    in the past   CAD (coronary artery disease)    s/p CABG 2006   GERD (gastroesophageal reflux disease)    H/O seasonal allergies    H/O: rheumatic fever    Hearing deficit    Bilateral hearing aids   Hematuria    had CT scan   Hiatal hernia    History of kidney stones    HOH (hard of hearing)    Hyperlipidemia    Hypertension    Hyperthyroidism    Inguinal hernia    right side; watching at this time per wife's report   Memory difficulty 08/27/2014   Pacemaker    Parkinson's disease    Persistent atrial fibrillation (HCC)    Scarlet fever    as a child   Shortness of breath    Sick sinus syndrome (HCC)    s/p PPM (MDT) 2006   Past Surgical History:  Procedure Laterality Date   AORTIC VALVE REPLACEMENT  2006   CARDIOVERSION N/A 09/14/2013   Procedure: CARDIOVERSION;  Surgeon: Thurmon Fair, MD;  Location: [facility] ENDOSCOPY;  Service: Cardiovascular;  Laterality: N/A;   CHOLECYSTECTOMY N/A 07/21/2013   Procedure: LAPAROSCOPIC CHOLECYSTECTOMY;  Surgeon: Mariella Saa, MD;  Location: [facility] OR;  Service: General;  Laterality: N/A;   COLONOSCOPY W/ POLYPECTOMY     COLONOSCOPY WITH PROPOFOL N/A 08/31/2016   Procedure: COLONOSCOPY WITH PROPOFOL;  Surgeon: Charlott Rakes, MD;   Location: [facility] ENDOSCOPY;  Service: Endoscopy;  Laterality: N/A;   CORONARY ARTERY BYPASS GRAFT  2006   DENTAL SURGERY     implants   INSERT / REPLACE / REMOVE PACEMAKER  2006, 2012   MDT for sick sinus syndrome, gen change 8/12 by [facility]   KIDNEY STONE SURGERY  1984   Patient Active Problem List   Diagnosis Date Noted   HOH (hard of hearing) 12/18/2020   Personal history of colonic polyps 08/31/2016   Abnormality of gait 08/27/2014   Memory difficulty 08/27/2014   Permanent atrial fibrillation (HCC) 09/08/2013   Cholelithiases 07/19/2013   Cholelithiasis 07/19/2013   Encounter for therapeutic drug monitoring 07/03/2013   Mechanical aortic valve 04/05/2013   Parkinson disease 09/27/2012   Orthostatic hypotension 04/01/2012   Pacemaker-[employer] 03/29/2012   HEPATITIS A 06/04/2010   HYPERTHYROIDISM 06/04/2010   HYPERLIPIDEMIA 06/04/2010   Essential hypertension 06/04/2010   Coronary artery disease involving native coronary artery of native heart without angina pectoris 06/04/2010   SICK SINUS SYNDROME 06/04/2010   HIATAL HERNIA 06/04/2010   SHORTNESS OF BREATH 06/04/2010   CHEST PAIN 06/04/2010    ONSET DATE:  referred 09/01/22  REFERRING DIAG: G20.B2 (ICD-10-CM) - Parkinson's disease with dyskinesia and fluctuating manifestations   THERAPY DIAG:  Dysarthria and anarthria  Cognitive communication deficit  Dysphagia, oropharyngeal phase  Rationale for Evaluation and Treatment: Rehabilitation  SUBJECTIVE:   SUBJECTIVE STATEMENT: 1455, pt in PT "How do I get [name] to enunciate?" (Pt's PT, approx7 minutes after targeting intelligible speech in ST) Pt accompanied by: self    PERTINENT HISTORY: Dx wtih PD 2012. MBSS in July 2020 revealed moderate dysphagia with significant pharyngeal residue, silent aspiration, reduce tongue base, epiglittic inversion, pharyngeal contraction, hocking and drool. Prior ST course 2022 at [facility].   PAIN:  Are you having pain? No  FALLS:  Has patient fallen in last 6 months?  See PT evaluation for details   PATIENT GOALS: swallow and talk better  OBJECTIVE:   DIAGNOSTIC FINDINGS:  MBSS 10/09/22 Clinical Impression: Pt exhibits mild oral and severe pharyngeal dysphagia which has mild-moderately progressed since MBS in 2022. Orally he was able to control and propel boluses with mild delays and lingual residue. He silently aspirated thin, nectar and puree consistencies due to decreased hyoid excursion, laryngeal elevation and laryngeal vestibular closure. Nectar thick was aspirated before laryngeal closure could be achieved. Cues for strong cough did not clear aspirates and only briefly cleared penetrates before barium reappeared on vocal cords.  Reduced tongue base retraction, decreased epiglottic deflection and pharyngeal stripping wave led to max vallecular and pyriform sinus residue with puree and nectar. Multiple swallows were ineffective to decrease residue however chin tuck did appear to minimally reduce residue. Chin tuck during the swallow was not effective to protect airway nor were right or left head turns. Options were discussed with pt and wife. His wishes are to continue eating and drinking and she states pt has not had pneumonia. Continued education re: risk if pt were to experience an illness or injury that risk of pneumona and decompnesation increases. Pt to continue regular texture,thin liquids, multiple swallows with chin tuck, hard coughs after swallows, alternate liquids and solids, remain upright 1 hour after meals and crush pills. Factors that may increase risk of adverse event in presence of aspiration [name] [employer] [name] 2021): Factors that may increase risk of adverse event in presence of aspiration [name] [employer] [name] 2021): Frail or deconditioned; Weak cough; Aspiration of thick, dense, and/or acidic materials; Frequent aspiration of large volumes Recommendations/Plan: Swallowing Evaluation Recommendations PO Diet  Recommendation: Regular; Thin liquids (Level 0) Liquid Administration via: Cup Medication Administration: Crushed with puree Supervision: Patient able to self-feed; Full supervision/cueing for swallowing strategies Swallowing strategies  : Slow rate; Small bites/sips; Chin tuck; Follow solids with liquids; Multiple dry swallows after each bite/sip; Hard cough after swallowing Postural changes: Stay upright 30-60 min after meals; Position pt fully upright for meals Oral care recommendations: Oral care BID (2x/day)  PATIENT REPORTED OUTCOME MEASURES (PROM): PROM was completed (CES) with a score of 13/32. Higher scores indicate greater effectiveness of communication.   TODAY'S TREATMENT:  DATE:  10/19/22: Pt, in OT session, was watched by SLP to see if he followed any aspiration precautions with water. He did not. SLP reminded him he needed to take small sips, swallow at least twice, and cough and reswallow last swallow. In simple 7 minute conversation about events yesterday pt req'd usual mod-max A for speech intelligibility. Frequency of cueing did not lessen cueing level needed by SLP.  Pt had three days where he completed HEP BID in the last week. With /a/, pt benefited from simultaneous SLP production, as well as simultaneous production with SLP with cues for breath before each sentence with everyday sentences. SLP instructed pt to repeat sentences and reminded him he needs to shout with every response he provides. He told SLP [name] asks him to repeat almost everything he says - SLP correlated the fact he needs to shout with being more intelligible and [name] won't have to ask him to repeat as often if he shouts everything he says. He acknowledged understanding. The next 7 minutes SLP engaged pt in conversation and intelligibility improved to 70%, and 80% if SLP  nonverbally cued pt he was not understood.   10/16/22: SPEECH: 8 minute simple conversation with pt - pt req'd usual mod A for speech intelligibility strategy usage -without pt using overarticulation and louder speech SLP understood 80%. SLP noted wife demonstrated understanding of pt 95% of the time. SWALLOW: Pt req'd initial consistent min-mod A for following precautions (except small bites/sips) with Dys III/thin, faded to usual min A for double swallows, cough, and alternate bite/sip.  10/14/22: SWALLOW: Pt with MBS 10/09/22. Results above. SLP strongly reiterated results of exam with pt/wife. SLP practiced recommendations with POs with pt, who required mod A usually for chin tuck, double swallows, and cough. Fading cues to min A occasionally for other precautions. Pt took small bites until end of session when SLP was not looking and then he took x2 bite size that he had been taking with SLP observing. SLP told pt to take smaller bites to mitigate risk of aspiration. SLP informed pt and wife that wife may need to assist pt during meals. SLP asked [place] if this was ok and he agreed. Pt is not performing speech intelligibilty exercises as directed - SLP is cautious to recommend HEP for dysphagia, suspecting pt will also not complete as directed. SPEECH: He performed loud /a/ with consistent simultaneous production and usual mod cues for full breath before production. Held loud /a/ for 5 seconds average. [name] got out pt's sentences for him and he req'd consistent max cues for taking breath for each one and producing loud speech for each of them. SLP looked at pt's practice sheet and he is performing once/day. SLP strongly stressed BID completion and told pt and wife rationale.   10/07/22: Pt told SLP MBS scheduled for Friday 10/09/22. Arrived with "wet" voice and took x3 attempts to clear. Once cleared, pt did not have to clear again for remainder of session. SLP provided max A for /a/ x10 - simultaneous  production needed. SLP provided max A to slow down and read sentences using one breath per sentence, and shout sentences. SLP worked remainder of session with consistent mod cues to "shout" when pt speaks in sentence responses. When doing so, SLP understood [name] 75% of the time, however even with a cue to shout, [place] only used incr'd volume and articulation 70% of the time. When not "shouting", SLP understood [name] ~20% of the time.  Pt had reportedly  completed exercises twice yesterday.   10/05/22: SLP cont to require to use very loud speech volume for pt to demonstrate understanding of SLP speech. Since last session pt has completed /a/ and sentences once/day, with two days BID. SLP told pt to complete every day BID.  Simultaneous production necessary for loud /a/ - otherwise pt with loudness decay after 2-3 seconds. SLP told [name]) she may need to complete loud /a/ with pt based upon today's production/s. SLP wonders the effectiveness of pt's practice thus far, if wife is not assisting pt. SLP cont to remind pt throughout session to "shout, so we can understand you" and that pt normal speech is mumbling in nature and this cannot be understood. Pt benefited from occasional cues to incr volume for the remaining time throughout the session (14 minutes) to "shout". [name] endorsed it was easier to hear pt during session today than it is at home, and especially in the car. SLP told [name] to use "shout" as a cue for pt at home and also in the car, to see if this improves pt's loudness/clarity.  09/30/22: Pt wife waiting on a call back from scheduler for MBSS. SLP required to provide a very loud speaking volume for pt to respond, or ask SLP for repeat. [name] stated pt had hearing aids serviced "about a month ago." SLP may suggest return to hearing aid dispenser for service/checkup. Max cues needed for loud /a/ including modeling. Pt average loudness upper 80s dB with average 6 seconds, even  with max cues for longer production. SLP told pt 10 reps, BID. SLP educated pt/wife that pt and wife should generate 10 everyday sentences to practice at home BID after the loud /a/ reps. When SLP inquired how consistent pt is with performing other HEPs in the past [name] stated, "He isn't too good with doing (the HEP) at home." SLP reiterated that Fickle must do HEP at least 5 days/week for there to be any possibility of progress with speech intelligibility and/or with swallow strength.SLP provided pt/wife with HEP checklist for completion with "ahhhhhh" and "everyday sentences" slots, to encourage HEP completion. At the end of session wife provided Mccommon with a sip of water and pt immediately coughed after an audible swallow.  09/28/22: Initiated training on dysarthria strategies: slow, loud, over-articulate, with practice at phrase reading and generative levels. Direct model required to optimize production and improve intelligibility. Education on POC and goals, to which pt and spouse verbalize agreement.   PATIENT EDUCATION: Education details: see above Person educated: Patient and Spouse Education method: Explanation, Demonstration, Verbal cues, and Handouts Education comprehension: verbalized understanding, returned demonstration, verbal cues required, and needs further education  HOME EXERCISE PROGRAM: Sustained "ah," oral reading   GOALS: Goals reviewed with patient? Yes  SHORT TERM GOALS: Target date: 10/26/2022  Pt will participate in MBSS Baseline: Goal status: MET  2.  Pt will demonstrate use of swallow compensations with use of visual aid and occasional verbal cues during structured practice Baseline:  Goal status: IN PROGRESS  3.  Pt will use dysarthria strategies, resulting in intelligible production of 15/20 sentences  Baseline:  Goal status: IN PROGRESS  4.  Pt will demonstrate dysphagia exercises with mod-A over 2 sessions  Baseline:  Goal status: IN  PROGRESS   LONG TERM GOALS: Target date: 11/23/2022  Pt will complete dysphagia and dysarthria HEP 5/7 days over 1 week period Baseline:  Goal status: IN PROGRESS  2.  Pt and spouse will report carryover of swallow  compensations and strategies at home Baseline:  Goal status: IN PROGRESS  3.  Pt will use dysarthria strategies during 10 minute conversation, resulting in 80% intelligibility  Baseline:  Goal status: IN PROGRESS  ASSESSMENT:  CLINICAL IMPRESSION: Patient is a 82 y.o. M who was seen today for moderate dysarthria, c/b low vocal intensity and imprecise articulation which results in significantly decreased intelligibility. Pt would benefit from cont'd skilled ST to address aforementioned deficits to reduce caregiver burden , enhance communication efficacy, and facilitate improved swallow function/safety . See today's note for more details. SLP pondering feasibility of a dysphagia HEP when pt is not completing dysarthria HEP as directed by SLP , in order to achieve progress.  OBJECTIVE IMPAIRMENTS: Objective impairments include attention, memory, executive functioning, dysarthria, and dysphagia. These impairments are limiting patient from effectively communicating at home and in community and safety when swallowing.Factors affecting potential to achieve goals and functional outcome are ability to learn/carryover information and co-morbidities.. Patient will benefit from skilled SLP services to address above impairments and improve overall function.  REHAB POTENTIAL: Good  PLAN:  SLP FREQUENCY: 2x/week  SLP DURATION: 8 weeks  PLANNED INTERVENTIONS: Aspiration precaution training, Pharyngeal strengthening exercises, Diet toleration management , Environmental controls, Trials of upgraded texture/liquids, Cueing hierachy, Internal/external aids, Functional tasks, Multimodal communication approach, SLP instruction and feedback, Compensatory strategies, Patient/family education, and  Re-evaluation    Loron Weimer, CCC-SLP 10/19/2022, 8:32 PM

## 2022-10-19 NOTE — Therapy (Signed)
OUTPATIENT OCCUPATIONAL THERAPY PARKINSON'S  Treatment Note  Patient Name: Donald [name] MRN: [id number] DOB:1941/03/12, 82 y.o., male Today's Date: 10/19/2022  PCP: Gaspar Garbe, MD REFERRING PROVIDER: Vladimir Faster, DO  END OF SESSION:  OT End of Session - 10/19/22 1327     Visit Number 7    Number of Visits 17    Date for OT Re-Evaluation 11/27/22    Authorization Type [employer]    OT Start Time 1318    OT Stop Time 1400    OT Time Calculation (min) 42 min    Activity Tolerance No increased pain;Patient tolerated treatment well    Behavior During Therapy [facility] for tasks assessed/performed;Impulsive             Past Medical History:  Diagnosis Date   Abnormality of gait 08/27/2014   Anxiety    Aortic stenosis    s/p AVR by Dr Laneta Simmers   Arthritis    left wrist- probable- not diagonised   Asthma    in the past   CAD (coronary artery disease)    s/p CABG 2006   GERD (gastroesophageal reflux disease)    H/O seasonal allergies    H/O: rheumatic fever    Hearing deficit    Bilateral hearing aids   Hematuria    had CT scan   Hiatal hernia    History of kidney stones    HOH (hard of hearing)    Hyperlipidemia    Hypertension    Hyperthyroidism    Inguinal hernia    right side; watching at this time per wife's report   Memory difficulty 08/27/2014   Pacemaker    Parkinson's disease    Persistent atrial fibrillation (HCC)    Scarlet fever    as a child   Shortness of breath    Sick sinus syndrome (HCC)    s/p PPM (MDT) 2006   Past Surgical History:  Procedure Laterality Date   AORTIC VALVE REPLACEMENT  2006   CARDIOVERSION N/A 09/14/2013   Procedure: CARDIOVERSION;  Surgeon: Thurmon Fair, MD;  Location: [facility] ENDOSCOPY;  Service: Cardiovascular;  Laterality: N/A;   CHOLECYSTECTOMY N/A 07/21/2013   Procedure: LAPAROSCOPIC CHOLECYSTECTOMY;  Surgeon: Mariella Saa, MD;  Location: [facility] OR;  Service: General;  Laterality: N/A;   COLONOSCOPY W/  POLYPECTOMY     COLONOSCOPY WITH PROPOFOL N/A 08/31/2016   Procedure: COLONOSCOPY WITH PROPOFOL;  Surgeon: Charlott Rakes, MD;  Location: [facility] ENDOSCOPY;  Service: Endoscopy;  Laterality: N/A;   CORONARY ARTERY BYPASS GRAFT  2006   DENTAL SURGERY     implants   INSERT / REPLACE / REMOVE PACEMAKER  2006, 2012   MDT for sick sinus syndrome, gen change 8/12 by [facility]   KIDNEY STONE SURGERY  1984   Patient Active Problem List   Diagnosis Date Noted   HOH (hard of hearing) 12/18/2020   Personal history of colonic polyps 08/31/2016   Abnormality of gait 08/27/2014   Memory difficulty 08/27/2014   Permanent atrial fibrillation (HCC) 09/08/2013   Cholelithiases 07/19/2013   Cholelithiasis 07/19/2013   Encounter for therapeutic drug monitoring 07/03/2013   Mechanical aortic valve 04/05/2013   Parkinson disease 09/27/2012   Orthostatic hypotension 04/01/2012   Pacemaker-Medtronic 03/29/2012   HEPATITIS A 06/04/2010   HYPERTHYROIDISM 06/04/2010   HYPERLIPIDEMIA 06/04/2010   Essential hypertension 06/04/2010   Coronary artery disease involving native coronary artery of native heart without angina pectoris 06/04/2010   SICK SINUS SYNDROME 06/04/2010   HIATAL HERNIA 06/04/2010  SHORTNESS OF BREATH 06/04/2010   CHEST PAIN 06/04/2010    ONSET DATE: referral date 09/01/22 (PD diagnosis 2014)  REFERRING DIAG: G20.B2 (ICD-10-CM) - Parkinson's disease with dyskinesia and fluctuating manifestations  THERAPY DIAG:  Other symptoms and signs involving the nervous system  Other abnormalities of gait and mobility  Unsteadiness on feet  Other lack of coordination  Rationale for Evaluation and Treatment: Rehabilitation  SUBJECTIVE:   SUBJECTIVE STATEMENT: Pt's spouse reporting pt is moving a "bit slower" today.  Pt accompanied by: self & spouse [name] in waiting area   PERTINENT HISTORY: Parkinson's Disease. PMH: hx of falls (wears helment), arthritis, asthma, CAD, GERD, HOH, HLD, HTN,  hyperthyroidism, anxiety, aortic stenosis, hx of aortic valve replacement 2006, memory difficulty, pacemaker, persistent a-fib, hx of scarlet fever, shortness of breath, hx of CABG 2006, cholecystectomy 2015, neuropathy  PRECAUTIONS: Fall; WEIGHT BEARING RESTRICTIONS: No  PAIN:  Are you having pain? None today reported  FALLS: Has patient fallen in last 6 months? Yes. Number of falls fall frequently, reports falling 3x today  LIVING ENVIRONMENT: Lives with: lives with their spouse Lives in: House/apartment Stairs:  ramped entrance Has following equipment at home: Grab bars, Ramped entry, and U-step walker  PLOF: Independent with basic ADLs and Requires assistive device for independence  PATIENT GOALS: to work on his balance   OBJECTIVE:  (All objective assessments below are from initial evaluation on: 09/28/22 unless otherwise specified.)   HAND DOMINANCE: Left  ADLs: Overall ADLs: Pt reports it takes about 30 mins to get dressed, but nobody helps him with that.  Reports that he does not need any help with any bathroom transfers.  He utilizes a hook on pants button instead of buttoning and wears step in shoes to eliminate tying shoes. Transfers/ambulation related to ADLs: Utilizes U-step walker  Equipment: Grab bars and Walk in shower  IADLs: Light housekeeping: changes bed linen Medication management: wife administers Handwriting: 75% legible, shaky  MOBILITY STATUS: Hx of falls and ambulates with U-step walker and falls frequently  POSTURE COMMENTS:  rounded shoulders and forward head  FUNCTIONAL OUTCOME MEASURES: Fastening/unfastening 3 buttons: 5:16.46 (Pt utilizing all fingers intermittently to attempt to fasten buttons) Physical performance test: PPT#2 (simulated eating) 22.93 sec & PPT#4 (donning/doffing jacket): 2:41.1 (attempted for 45 seconds with inability to thread 2nd arm, therefore switched arms)  COORDINATION: 9 Hole Peg test: Right: 2.43.9; Left: 3:47.9 (pt  dropping multiple pegs and benefiting from picking up from table top by sliding off edge of table)  10/16/22: 9 hole peg test: R: 4:58 (pt dropping multiple pegs and transferring them from finger tips to palm and then attempting to place with gross grasp); L: 6:07 09/30/22: Box and blocks: L: 28 blocks, R: 37 blocks 10/19/22: box and blocks: L: 23 blocks, then 28 blocks and R: 43 blocks  UE ROM:   R shoulder flexion 110*, L shoulder flexion 110*  SENSATION: "Feels like ice"  COGNITION: Overall cognitive status: No family/caregiver present to determine baseline cognitive functioning and HOH makes this determination more difficult  OBSERVATIONS:    10/14/22: today he presents less impulsive than last session- more sedate, quite and slow. More typical of PD.  He is still toe-walking.    TODAY'S TREATMENT:  10/19/22 Large amplitude: engaged in picking up and transferring rings from cone to cone with focus on elbow extension and large hand opening when grasping rings.  Cones placed to facilitate increased elbow extension and reaching across midline and outside BOS.  Completed in sitting progressing to standing.  In standing with feet apart and staggered stance for improved BOS and decrease need of stepping with trunk rotation.  OT providing CGA throughout dynamic standing task. Coordination: Engaged in translation from palm to finger tips with Connect 4 piece.  Pt able to translate from finger tips to palm, but unable to translate back to finger tips without turning over palm and using momentum and gravity.  Transitioned back to marker with pt still demonstrating difficulty with translating palm to finger tips as well.  OT providing demonstration and min-mod cues to facilitate large opening of hand with manipulation of marker and Connect 4 piece.  OT demonstrating rotation with twirling  large pen both directions.  Pt with significant difficulty with rotation, however demonstrating near ability to complete on R compared to unable to sequence/complete on L.   Box and blocks: L: 23 blocks, then 28 blocks and R: 43 blocks   10/16/22 Connect 4: engaged in picking up and placing connect 4 pieces into grid with each hand incorporating midrange reach for increased elbow extension with mod cueing for large amplitude movements to open hand. Pt demonstrating decreased hypokinesia with repetition and setup of task. Coordination: as pt demonstrating improved motor control and grasp with Connect 4 attempted 9 hole peg test bilaterally.  Pt requiring significantly increased time to complete.  Noted frequent dropping of pegs and dropping from precision pinch into hand - therefore educated pt on translation from palm <> finger tips with marker and use of Connect 4 piece.  Pt able to complete with increased time and effort, but with repetition demonstrating some carryover.     10/14/22: His wife states that he is stubborn and won't listen to her, also that most of his falls are from going outside on gravel and trying to get on his tractor. He does cognitive activity with OT (SLUMS TEST) to help determine a baseline for cognition and he scores 14/30 points indicating "dementia level" cognition.  (OT did try to ensure that he could hear and reapeat back info, though very poor hearing could have had at least a mild effect.)  His wife is cautioned that this may mean he is not safe to be left alone, especially as he has hx of repeatedly/impulsively falling from tractor after trying to get back on. It shows his poor safety awareness, etc. For his self-care, safety she is recommended to not left him try high risk activities himself. Next, he does a building activity for FMS and direction following to build a replica simple model.  He needs min A cues to complete correctly. He also does seated catch/throw for UE fnl  reaching activity/timing/coordination.   He did not bring finger orthosis to be checked- his wife is informed about it's purpose.  She does leave the session 1/2 way through, as she fels her presence distracts him.  It seems like he is more dependent on her, in OT opinion (due to hearing and cognition).    PATIENT EDUCATION: Education details: ongoing condition specific education Person educated: Patient and Spouse Education method: Explanation Education comprehension: verbalized understanding and needs further education  HOME EXERCISE PROGRAM: Access Code: [email or web address] URL: [email or web address] Date: 10/07/2022 Prepared by: Fannie Knee   GOALS: Goals  reviewed with patient? Yes  SHORT TERM GOALS: Target date: 10/30/22  Pt will perform PD-specific HEP with cueing prn from caregiver  Baseline: Goal status: IN PROGRESS  2.  Pt will improve R hand coordination for ADLs (buttoning/tying shoes) as shown by completing 9-hole peg test in 2 min or less  Baseline: Right: 2.43.9; Left: 3:47.9  Goal status: IN PROGRESS  3.  Pt will improve L hand coordination for ADLs (buttoning/tying shoes) as shown by completing 9-hole peg test in 3 mins or less  Baseline: Right: 2.43.9; Left: 3:47.9  Goal status: IN PROGRESS  4.  Pt/caregiver will verbalize understanding of ways to decr risk of future complcations related to PD (including falls).  Baseline:  Goal status: IN PROGRESS   LONG TERM GOALS: Target date: 11/27/22  Pt/caregiver will verbalize understanding of AE/strategies to increase ease, safety, and independence with ADLs/IADLs  Baseline:  Goal status: IN PROGRESS  2.  Pt will improve functional reaching/coordination for ADLs as shown by improving score on box and blocks test by at least 5 with dominant LUE  Baseline:  L: 28 blocks, R: 37 blocks Goal status: IN PROGRESS  3.  Pt will complete 3 button/unbutton in 4 mins or less with use of AE PRN to demonstrate  increased coordination and motor control to manage clothing fasteners. Baseline:  5:16.46  Goal status: IN PROGRESS  4.  Pt will demonstrate improved ease with feeding as evidenced by decreasing PPT#2 (self feeding) by 3 secs Baseline: PPT#2 (simulated eating) 22.93 sec  Goal status: IN PROGRESS  5.  Pt will demonstrate ability to retrieve a lightweight object at moderate range to increase shoulder flexion. Baseline:  Goal status: IN PROGRESS  ASSESSMENT:  CLINICAL IMPRESSION: Pt continues to demonstrate decreased elbow extension with reaching activities in sitting and standing.  OT facilitated increased challenge with standing with focus on foot placement for improved stance and BOS.  OT reviewing translation exercise and addition of rotation exercise with marker and Connect 4 piece to continue to address manipulation of items to aid in engagement in self-care tasks as well as fine and gross motor control tasks.   PLAN:  OT FREQUENCY: 2x/week  OT DURATION: 8 weeks  PLANNED INTERVENTIONS: self care/ADL training, therapeutic exercise, therapeutic activity, neuromuscular re-education, passive range of motion, balance training, functional mobility training, ultrasound, compression bandaging, moist heat, cryotherapy, patient/family education, cognitive remediation/compensation, psychosocial skills training, energy conservation, coping strategies training, and DME and/or AE instructions  CONSULTED AND AGREED WITH PLAN OF CARE: Patient and family member/caregiver  PLAN FOR NEXT SESSION: Focus on cognitive/safety tasks, as well as FMS and also standing balance/coordination activities.    Elizette Shek, OTR/L 10/19/2022, 1:28 PM

## 2022-10-20 NOTE — Therapy (Signed)
OUTPATIENT PHYSICAL THERAPY NEURO TREATMENT  Patient Name: Donald [name] MRN: [id number] DOB:1940-08-22, 82 y.o., male Today's Date: 10/21/2022   PCP: Gaspar Garbe, MD REFERRING PROVIDER: Vladimir Faster, DO  END OF SESSION:  PT End of Session - 10/21/22 1358     Visit Number 8    Number of Visits 12    Date for PT Re-Evaluation 11/09/22    Authorization Type [employer]    PT Start Time 1315    PT Stop Time 1357    PT Time Calculation (min) 42 min    Equipment Utilized During Treatment Gait belt    Activity Tolerance Patient tolerated treatment well    Behavior During Therapy Impulsive   improving with cues                Past Medical History:  Diagnosis Date   Abnormality of gait 08/27/2014   Anxiety    Aortic stenosis    s/p AVR by Dr Laneta Simmers   Arthritis    left wrist- probable- not diagonised   Asthma    in the past   CAD (coronary artery disease)    s/p CABG 2006   GERD (gastroesophageal reflux disease)    H/O seasonal allergies    H/O: rheumatic fever    Hearing deficit    Bilateral hearing aids   Hematuria    had CT scan   Hiatal hernia    History of kidney stones    HOH (hard of hearing)    Hyperlipidemia    Hypertension    Hyperthyroidism    Inguinal hernia    right side; watching at this time per wife's report   Memory difficulty 08/27/2014   Pacemaker    Parkinson's disease    Persistent atrial fibrillation (HCC)    Scarlet fever    as a child   Shortness of breath    Sick sinus syndrome (HCC)    s/p PPM (MDT) 2006   Past Surgical History:  Procedure Laterality Date   AORTIC VALVE REPLACEMENT  2006   CARDIOVERSION N/A 09/14/2013   Procedure: CARDIOVERSION;  Surgeon: Thurmon Fair, MD;  Location: [facility] ENDOSCOPY;  Service: Cardiovascular;  Laterality: N/A;   CHOLECYSTECTOMY N/A 07/21/2013   Procedure: LAPAROSCOPIC CHOLECYSTECTOMY;  Surgeon: Mariella Saa, MD;  Location: [facility] OR;  Service: General;  Laterality: N/A;    COLONOSCOPY W/ POLYPECTOMY     COLONOSCOPY WITH PROPOFOL N/A 08/31/2016   Procedure: COLONOSCOPY WITH PROPOFOL;  Surgeon: Charlott Rakes, MD;  Location: [facility] ENDOSCOPY;  Service: Endoscopy;  Laterality: N/A;   CORONARY ARTERY BYPASS GRAFT  2006   DENTAL SURGERY     implants   INSERT / REPLACE / REMOVE PACEMAKER  2006, 2012   MDT for sick sinus syndrome, gen change 8/12 by [facility]   KIDNEY STONE SURGERY  1984   Patient Active Problem List   Diagnosis Date Noted   HOH (hard of hearing) 12/18/2020   Personal history of colonic polyps 08/31/2016   Abnormality of gait 08/27/2014   Memory difficulty 08/27/2014   Permanent atrial fibrillation (HCC) 09/08/2013   Cholelithiases 07/19/2013   Cholelithiasis 07/19/2013   Encounter for therapeutic drug monitoring 07/03/2013   Mechanical aortic valve 04/05/2013   Parkinson disease 09/27/2012   Orthostatic hypotension 04/01/2012   Pacemaker-Medtronic 03/29/2012   HEPATITIS A 06/04/2010   HYPERTHYROIDISM 06/04/2010   HYPERLIPIDEMIA 06/04/2010   Essential hypertension 06/04/2010   Coronary artery disease involving native coronary artery of native heart without angina pectoris 06/04/2010  SICK SINUS SYNDROME 06/04/2010   HIATAL HERNIA 06/04/2010   SHORTNESS OF BREATH 06/04/2010   CHEST PAIN 06/04/2010    ONSET DATE: 2014  REFERRING DIAG:  G20.B2 (ICD-10-CM) - Parkinson's disease with dyskinesia and fluctuating manifestations    THERAPY DIAG:  Other symptoms and signs involving the nervous system  Other abnormalities of gait and mobility  Unsteadiness on feet  Rationale for Evaluation and Treatment: Rehabilitation  SUBJECTIVE:                                                                                                                                                                                             SUBJECTIVE STATEMENT: "Where are we going?" No pain today.  Pt accompanied by: self-spouse in lobby  PERTINENT HISTORY:  (from recent PT screen)  Timed Up and Go test:  29.82 sec with U-step (compared to 21.41 sec at d/c)  10 meter walk test: 24.5 sec = 1.34 ft/sec (compared to 2.25 ft/sec at d/c)  5 time sit to stand test: 12 sec with UE support (compared 19.63 sec)  PAIN:  Are you having pain? No  PRECAUTIONS: Fall, HOH  WEIGHT BEARING RESTRICTIONS: No  FALLS: Has patient fallen in last 6 months? Yes. Number of falls numerous/often multiple per day  LIVING ENVIRONMENT: Lives with: lives with their spouse Lives in: House/apartment Stairs:  reports ramp access in/out Has following equipment at home: Environmental consultant - 4 wheeled and U-step  PLOF: Requires assistive device for independence and Needs assistance with homemaking  PATIENT GOALS: Improve balance and walking  OBJECTIVE:    TODAY'S TREATMENT: 10/21/22 Activity Comments  Pt able to demo larger step length when prompted from waiting room to nustep x66ft   Nustep L5 x 6 min  For neural priming; good speed and amplitude  review seated PWR moves with boom whackers and update into HEP 2x10 each  Good use of voice boost (pt counting reps) required continuous demo and verbal cueing   fgait with U-step: forward walking, turns "kick the line"  Cueing especially for longer R foot step, not festinating with turns   Alt side step to colored dots Good form, quick  Alt forward step to colored dots Pt fearful holding on with only 1 hand, provided 1 HHA    PATIENT EDUCATION: Education details: spoke with wife about concerns on pt's low BP and meds, provided handout on 4 basic seated PWR! Moves to be performed with boomwhackers/pool noodles at home Person educated: Patient and Spouse Education method: Explanation, Demonstration, Tactile cues, Verbal cues, and Handouts Education comprehension: verbalized understanding and returned demonstration    Access Code: [email or web address]  URL: [email or web address] Date: 10/07/2022 Prepared by: [facility] Out[facility]   Rehab - [facility]  Exercises - Seated Hamstring Stretch  - 2 x daily - 5 x weekly - 1 sets - 3 reps - 10 sec hold - Seated Ankle Dorsiflexion AROM  - 1-2 x daily - 7 x weekly - 2 sets - 5 reps - 3 sec hold    ----------------------------------------------------------------------------- Objective measures below taken at initial evaluation:  DIAGNOSTIC FINDINGS:   COGNITION: Overall cognitive status: No family/caregiver present to determine baseline cognitive functioning, HOH makes this determination more difficult   SENSATION: WFL  COORDINATION: Grossly impaired    MUSCLE TONE: ratcheting in BLE  MUSCLE LENGTH: Hamstrings: Right -20 deg; Left -20 deg     POSTURE: rounded shoulders and forward head  LOWER EXTREMITY ROM:     Active  Right Eval Left Eval  Hip flexion    Hip extension    Hip abduction    Hip adduction    Hip internal rotation    Hip external rotation    Knee flexion -10 -10  Knee extension    Ankle dorsiflexion 8 11  Ankle plantarflexion    Ankle inversion    Ankle eversion     (Blank rows = not tested)  LOWER EXTREMITY MMT:    Grossly 4/5 BLE  BED MOBILITY:  NT  TRANSFERS: Assistive device utilized: Environmental consultant - 4 wheeled  Sit to stand: Modified independence and CGA Stand to sit: Modified independence and SBA Chair to chair: Modified independence and CGA Floor:  NT  RAMP:  NT  CURB:  Level of Assistance: CGA Assistive device utilized: Environmental consultant - 4 wheeled Curb Comments:   STAIRS: NT  GAIT: Gait pattern: shuffling and festinating Distance walked:  Assistive device utilized:  U-step Level of assistance: CGA and Min A Comments:   FUNCTIONAL TESTS:  5 times sit to stand: 33 sec w/ retro-LOB, pushing backs of knees to chair, multiple attempts needed Timed up and go (TUG): 23 sec. TUG w/ cog: 32.91 sec 10 meter walk test: 18.12 sec = 1.8 ft/sec  Mini-BESTest: 7/28      PATIENT EDUCATION: Education details:  assessment findings Person educated: Patient Education method: Explanation Education comprehension: verbalized understanding  HOME EXERCISE PROGRAM: TBD  GOALS: Goals reviewed with patient? Yes  SHORT TERM GOALS: Target date: 10/19/2022    Patient will perform HEP with family/caregiver supervision for improved strength, balance, transfers, and gait  Baseline: Goal status: IN PROGRESS  2.  Manifest improved balance and BLE strength per 20 sec 5xSTS test without compensatory movements Baseline: 33 sec w/ compensations Goal status: IN PROGRESS    LONG TERM GOALS: Target date: 11/09/2022    Reduce risk for falls per score 18/28 Mini-BESTest Baseline: 7/28 Goal status: IN PROGRESS  2.  Demo improved efficiency and safety with ambulation maintaining speed of 2.25 ft/sec w/ least restrictive AD to improve community ambulation Baseline: 1.8 ft/sec Goal status: IN PROGRESS  3.  Demo improved safety with gait and negotiating turns per time of 15 sec TUG test Baseline: 23 sec w/ U-step Goal status: IN PROGRESS    ASSESSMENT:  CLINICAL IMPRESSION: Patient arrived to session with wife for caregiver education on PWR! Moves for participation at home. Patient benefited from demo and verbal cues for these activities. Gait training required cues for increased step length on R LE. Patient and wife reported understanding of edu provided and without complaints upon leaving.   OBJECTIVE IMPAIRMENTS: Abnormal gait, decreased activity tolerance,  decreased balance, decreased coordination, decreased endurance, decreased mobility, difficulty walking, decreased ROM, decreased strength, impaired flexibility, impaired tone, improper body mechanics, and postural dysfunction.   ACTIVITY LIMITATIONS: carrying, lifting, bending, standing, transfers, reach over head, and locomotion level  PARTICIPATION LIMITATIONS: meal prep, cleaning, laundry, driving, shopping, community activity, and yard  work  PERSONAL FACTORS: Age, Time since onset of injury/illness/exacerbation, and 3+ comorbidities: PMH  are also affecting patient's functional outcome.   REHAB POTENTIAL: Good  CLINICAL DECISION MAKING: Evolving/moderate complexity  EVALUATION COMPLEXITY: Moderate  PLAN:  PT FREQUENCY: 1-2x/week  PT DURATION: 6 weeks  PLANNED INTERVENTIONS: Therapeutic exercises, Therapeutic activity, Neuromuscular re-education, Balance training, Gait training, Patient/Family education, Self Care, Joint mobilization, Stair training, Vestibular training, Canalith repositioning, DME instructions, Aquatic Therapy, Dry Needling, Electrical stimulation, Spinal mobilization, Cryotherapy, Moist heat, Taping, Traction, Ultrasound, Ionotophoresis 4mg /ml Dexamethasone, and Manual therapy  PLAN FOR NEXT SESSION: continue with seated PWR moves, seated exercises, standing march, kicks with cues for SLOWED motion; functional gait with U-step rollator-forward/back walking, turns    Anette Guarneri, PT, DPT 10/21/22 1:59 PM  [facility] at [facility] [address], [address] [place], [place] [address] Phone # [phone] Fax # [phone]

## 2022-10-21 ENCOUNTER — Encounter: Payer: Self-pay | Admitting: Physical Therapy

## 2022-10-21 ENCOUNTER — Ambulatory Visit: Payer: PPO

## 2022-10-21 ENCOUNTER — Ambulatory Visit: Payer: PPO | Admitting: Occupational Therapy

## 2022-10-21 ENCOUNTER — Ambulatory Visit: Payer: PPO | Admitting: Physical Therapy

## 2022-10-21 DIAGNOSIS — R471 Dysarthria and anarthria: Secondary | ICD-10-CM

## 2022-10-21 DIAGNOSIS — R41841 Cognitive communication deficit: Secondary | ICD-10-CM

## 2022-10-21 DIAGNOSIS — R278 Other lack of coordination: Secondary | ICD-10-CM

## 2022-10-21 DIAGNOSIS — M6281 Muscle weakness (generalized): Secondary | ICD-10-CM

## 2022-10-21 DIAGNOSIS — R2689 Other abnormalities of gait and mobility: Secondary | ICD-10-CM

## 2022-10-21 DIAGNOSIS — R29818 Other symptoms and signs involving the nervous system: Secondary | ICD-10-CM

## 2022-10-21 DIAGNOSIS — R1312 Dysphagia, oropharyngeal phase: Secondary | ICD-10-CM

## 2022-10-21 DIAGNOSIS — R2681 Unsteadiness on feet: Secondary | ICD-10-CM

## 2022-10-21 NOTE — Therapy (Signed)
OUTPATIENT SPEECH LANGUAGE PATHOLOGY TREATMENT   Patient Name: Donald [name] MRN: [id number] DOB:[birth year], 82 y.o., male Today's Date: 10/21/2022  PCP: Donald Garbe, MD REFERRING PROVIDER: Tat, Octaviano Batty, DO  END OF SESSION:        Past Medical History:  Diagnosis Date   Abnormality of gait 08/27/2014   Anxiety    Aortic stenosis    s/p AVR by Dr Donald [name]   Arthritis    left wrist- probable- not diagonised   Asthma    in the past   CAD (coronary artery disease)    s/p CABG 2006   GERD (gastroesophageal reflux disease)    H/O seasonal allergies    H/O: rheumatic fever    Hearing deficit    Bilateral hearing aids   Hematuria    had CT scan   Hiatal hernia    History of kidney stones    HOH (hard of hearing)    Hyperlipidemia    Hypertension    Hyperthyroidism    Inguinal hernia    right side; watching at this time per wife's report   Memory difficulty 08/27/2014   Pacemaker    Parkinson's disease    Persistent atrial fibrillation (HCC)    Scarlet fever    as a child   Shortness of breath    Sick sinus syndrome (HCC)    s/p PPM (MDT) 2006   Past Surgical History:  Procedure Laterality Date   AORTIC VALVE REPLACEMENT  2006   CARDIOVERSION N/A 09/14/2013   Procedure: CARDIOVERSION;  Surgeon: Donald Fair, MD;  Location: [name];  Service: Cardiovascular;  Laterality: N/A;   CHOLECYSTECTOMY N/A 07/21/2013   Procedure: LAPAROSCOPIC CHOLECYSTECTOMY;  Surgeon: Donald Saa, MD;  Location: [name];  Service: General;  Laterality: N/A;   COLONOSCOPY W/ POLYPECTOMY     COLONOSCOPY WITH PROPOFOL N/A 08/31/2016   Procedure: COLONOSCOPY WITH PROPOFOL;  Surgeon: Donald Rakes, MD;  Location: [name];  Service: [name];  Laterality: N/A;   CORONARY ARTERY BYPASS GRAFT  2006   DENTAL SURGERY     implants   INSERT / REPLACE / REMOVE PACEMAKER  2006, 2012   MDT for sick sinus syndrome, gen change 8/12 by [name]   KIDNEY STONE SURGERY  1984    Patient Active Problem List   Diagnosis Date Noted   HOH (hard of hearing) 12/18/2020   Personal history of colonic polyps 08/31/2016   Abnormality of gait 08/27/2014   Memory difficulty 08/27/2014   Permanent atrial fibrillation (HCC) 09/08/2013   Cholelithiases 07/19/2013   Cholelithiasis 07/19/2013   Encounter for therapeutic drug monitoring 07/03/2013   Mechanical aortic valve 04/05/2013   Parkinson disease 09/27/2012   Orthostatic hypotension 04/01/2012   Pacemaker-[employer] 03/29/2012   HEPATITIS A 06/04/2010   HYPERTHYROIDISM 06/04/2010   HYPERLIPIDEMIA 06/04/2010   Essential hypertension 06/04/2010   Coronary artery disease involving native coronary artery of native heart without angina pectoris 06/04/2010   SICK SINUS SYNDROME 06/04/2010   HIATAL HERNIA 06/04/2010   SHORTNESS OF BREATH 06/04/2010   CHEST PAIN 06/04/2010    ONSET DATE: referred 09/01/22  REFERRING DIAG: G20.B2 (ICD-10-CM) - Parkinson's disease with dyskinesia and fluctuating manifestations   THERAPY DIAG:  Cognitive communication deficit  Dysarthria and anarthria  Dysphagia, oropharyngeal phase  Rationale for Evaluation and Treatment: Rehabilitation  SUBJECTIVE:   SUBJECTIVE STATEMENT: "Did you hear me hollerin'?" (Pt, re: in PT) Per pt's exercise sheet, pt did not complete any HEP yesterday, and has once today Pt accompanied by:  self    PERTINENT HISTORY: Dx wtih PD 2012. MBSS in July 2020 revealed moderate dysphagia with significant pharyngeal residue, silent aspiration, reduce tongue base, epiglittic inversion, pharyngeal contraction, hocking and drool. Prior ST course 2022 at [facility].   PAIN:  Are you having pain? No  FALLS: Has patient fallen in last 6 months?  See PT evaluation for details   PATIENT GOALS: swallow and talk better  OBJECTIVE:   DIAGNOSTIC FINDINGS:  MBSS 10/09/22 Clinical Impression: Pt exhibits mild oral and severe pharyngeal dysphagia which has  mild-moderately progressed since MBS in 2022. Orally he was able to control and propel boluses with mild delays and lingual residue. He silently aspirated thin, nectar and puree consistencies due to decreased hyoid excursion, laryngeal elevation and laryngeal vestibular closure. Nectar thick was aspirated before laryngeal closure could be achieved. Cues for strong cough did not clear aspirates and only briefly cleared penetrates before barium reappeared on vocal cords.  Reduced tongue base retraction, decreased epiglottic deflection and pharyngeal stripping wave led to max vallecular and pyriform sinus residue with puree and nectar. Multiple swallows were ineffective to decrease residue however chin tuck did appear to minimally reduce residue. Chin tuck during the swallow was not effective to protect airway nor were right [name] left head turns. Options were discussed with pt and wife. His wishes are to continue eating and drinking and she states pt has not had pneumonia. Continued education re: risk if pt were to experience an illness [name] injury that risk of pneumona and decompnesation increases. Pt to continue regular texture,thin liquids, multiple swallows with chin tuck, hard coughs after swallows, alternate liquids and solids, remain upright 1 hour after meals and crush pills. Factors that may increase risk of adverse event in presence of aspiration Donald [name] & Donald [name] 2021): Factors that may increase risk of adverse event in presence of aspiration Donald [name] & [name] 2021): Frail [name] deconditioned; Weak cough; Aspiration of thick, dense, and/[name] acidic materials; Frequent aspiration of large volumes Recommendations/Plan: Swallowing Evaluation Recommendations PO Diet Recommendation: Regular; Thin liquids (Level 0) Liquid Administration via: Cup Medication Administration: Crushed with puree Supervision: Patient able to self-feed; Full supervision/cueing for swallowing strategies Swallowing strategies  : Slow rate;  Small bites/sips; Chin tuck; Follow solids with liquids; Multiple dry swallows after each bite/sip; Hard cough after swallowing Postural changes: Stay upright 30-60 min after meals; Position pt fully upright for meals Oral care recommendations: Oral care BID (2x/day)  PATIENT REPORTED OUTCOME MEASURES (PROM): PROM was completed (CES) with a score of 13/32. Higher scores indicate greater effectiveness of communication.   TODAY'S TREATMENT:                                                                                                                                         DATE:  10/21/22: SWALLOW: Given pt's lack of adherence to speech HEP SLP does not think pt will perform swallowing  HEP with any frequency to foster increasing muscular strength for swallowing. SLP assisted pt when eating fig bar and drinking water. Pt req'd consistent cues for all precautions but small sips/bites. When SLP cued pt to cough after small bite fig bar he coughed the entire bite up. SLP told wife this and strongly urged her to attend pt while he is eating and provide him with cues for following precautions. Later in session when SLP charting, Rumley took sip and SLP turned around to see pt taking consecutive sips from a straw and promptly coughed immediately afterwards. SLP used this example to reiterate to pt to use small sips. SPEECH: Loud /a/ produced today simultaneously with SLP for first 3 reps, and pt independent on last two productions, all with < 5 second length. SLP cued Makris to perform his sentences and req'd consistent mod A for breath support for each sentence - pt wanted to read off as many as possible on one breath. SLP worked with pt in functional context for speech - short 1-2 minute conversational segments. Pt talked about family with occasional min-mod A for "shout so I can hear you."  10/19/22: Pt, in OT session, was watched by SLP to see if he followed any aspiration precautions with water. He did not.  SLP reminded him he needed to take small sips, swallow at least twice, and cough and reswallow last swallow. In simple 7 minute conversation about events yesterday pt req'd usual mod-max A for speech intelligibility. Frequency of cueing did not lessen cueing level needed by SLP.  Pt had three days where he completed HEP BID in the last week. With /a/, pt benefited from simultaneous SLP production, as well as simultaneous production with SLP with cues for breath before each sentence with everyday sentences. SLP instructed pt to repeat sentences and reminded him he needs to shout with every response he provides. He told SLP [name] asks him to repeat almost everything he says - SLP correlated the fact he needs to shout with being more intelligible and [name] won't have to ask him to repeat as often if he shouts everything he says. He acknowledged understanding. The next 7 minutes SLP engaged pt in conversation and intelligibility improved to 70%, and 80% if SLP nonverbally cued pt he was not understood.   10/16/22: SPEECH: 8 minute simple conversation with pt - pt req'd usual mod A for speech intelligibility strategy usage -without pt using overarticulation and louder speech SLP understood 80%. SLP noted wife demonstrated understanding of pt 95% of the time. SWALLOW: Pt req'd initial consistent min-mod A for following precautions (except small bites/sips) with Dys III/thin, faded to usual min A for double swallows, cough, and alternate bite/sip.  10/14/22: SWALLOW: Pt with MBS 10/09/22. Results above. SLP strongly reiterated results of exam with pt/wife. SLP practiced recommendations with POs with pt, who required mod A usually for chin tuck, double swallows, and cough. Fading cues to min A occasionally for other precautions. Pt took small bites until end of session when SLP was not looking and then he took x2 bite size that he had been taking with SLP observing. SLP told pt to take smaller bites to mitigate risk  of aspiration. SLP informed pt and wife that wife may need to assist pt during meals. SLP asked Petrella if this was ok and he agreed. Pt is not performing speech intelligibilty exercises as directed - SLP is cautious to recommend HEP for dysphagia, suspecting pt will also not complete as directed. SPEECH:  He performed loud /a/ with consistent simultaneous production and usual mod cues for full breath before production. Held loud /a/ for 5 seconds average. [name] got out pt's sentences for him and he req'd consistent max cues for taking breath for each one and producing loud speech for each of them. SLP looked at pt's practice sheet and he is performing once/day. SLP strongly stressed BID completion and told pt and wife rationale.   10/07/22: Pt told SLP MBS scheduled for Friday 10/09/22. Arrived with "wet" voice and took x3 attempts to clear. Once cleared, pt did not have to clear again for remainder of session. SLP provided max A for /a/ x10 - simultaneous production needed. SLP provided max A to slow down and read sentences using one breath per sentence, and shout sentences. SLP worked remainder of session with consistent mod cues to "shout" when pt speaks in sentence responses. When doing so, SLP understood [name] 75% of the time, however even with a cue to shout, [place] only used incr'd volume and articulation 70% of the time. When not "shouting", SLP understood [name] ~20% of the time.  Pt had reportedly completed exercises twice yesterday.   10/05/22: SLP cont to require to use very loud speech volume for pt to demonstrate understanding of SLP speech. Since last session pt has completed /a/ and sentences once/day, with two days BID. SLP told pt to complete every day BID.  Simultaneous production necessary for loud /a/ - otherwise pt with loudness decay after 2-3 seconds. SLP told [name]) she may need to complete loud /a/ with pt based upon today's production/s. SLP wonders the effectiveness of  pt's practice thus far, if wife is not assisting pt. SLP cont to remind pt throughout session to "shout, so we can understand you" and that pt normal speech is mumbling in nature and this cannot be understood. Pt benefited from occasional cues to incr volume for the remaining time throughout the session (14 minutes) to "shout". [name] endorsed it was easier to hear pt during session today than it is at home, and especially in the car. SLP told [name] to use "shout" as a cue for pt at home and also in the car, to see if this improves pt's loudness/clarity.  09/30/22: Pt wife waiting on a call back from scheduler for MBSS. SLP required to provide a very loud speaking volume for pt to respond, [name] ask SLP for repeat. [name] stated pt had hearing aids serviced "about a month ago." SLP may suggest return to hearing aid dispenser for service/checkup. Max cues needed for loud /a/ including modeling. Pt average loudness upper 80s dB with average 6 seconds, even with max cues for longer production. SLP told pt 10 reps, BID. SLP educated pt/wife that pt and wife should generate 10 everyday sentences to practice at home BID after the loud /a/ reps. When SLP inquired how consistent pt is with performing other HEPs in the past [name] stated, "He isn't too good with doing (the HEP) at home." SLP reiterated that Nodarse must do HEP at least 5 days/week for there to be any possibility of progress with speech intelligibility and/[name] with swallow strength.SLP provided pt/wife with HEP checklist for completion with "ahhhhhh" and "everyday sentences" slots, to encourage HEP completion. At the end of session wife provided Pitman with a sip of water and pt immediately coughed after an audible swallow.  09/28/22: Initiated training on dysarthria strategies: slow, loud, over-articulate, with practice at phrase reading and generative levels. Direct model required  to optimize production and improve intelligibility. Education on POC  and goals, to which pt and spouse verbalize agreement.   PATIENT EDUCATION: Education details: see above Person educated: Patient and Spouse Education method: Explanation, Demonstration, Verbal cues, and Handouts Education comprehension: verbalized understanding, returned demonstration, verbal cues required, and needs further education  HOME EXERCISE PROGRAM: Sustained "ah," oral reading   GOALS: Goals reviewed with patient? Yes  SHORT TERM GOALS: Target date: 10/26/2022  Pt will participate in MBSS Baseline: Goal status: MET  2.  Pt will demonstrate use of swallow compensations with use of visual aid and occasional verbal cues during structured practice Baseline:  Goal status: IN PROGRESS  3.  Pt will use dysarthria strategies, resulting in intelligible production of 15/20 sentences  Baseline:  Goal status: IN PROGRESS  4.  Pt will demonstrate dysphagia exercises with mod-A over 2 sessions  Baseline:  Goal status: IN PROGRESS   LONG TERM GOALS: Target date: 11/23/2022  Pt will complete dysphagia and dysarthria HEP 5/7 days over 1 week period Baseline:  Goal status: IN PROGRESS  2.  Pt and spouse will report carryover of swallow compensations and strategies at home Baseline:  Goal status: IN PROGRESS  3.  Pt will use dysarthria strategies during 10 minute conversation, resulting in 80% intelligibility  Baseline:  Goal status: IN PROGRESS  ASSESSMENT:  CLINICAL IMPRESSION: Patient is a 82 y.o. M who was seen today for moderate dysarthria, c/b low vocal intensity and imprecise articulation which results in significantly decreased intelligibility. Pt would benefit from cont'd skilled ST to address aforementioned deficits to reduce caregiver burden , enhance communication efficacy, and facilitate improved swallow function/safety . See today's note for more details. SLP pondering feasibility of a dysphagia HEP when pt is not completing dysarthria HEP as directed by SLP , in  order to achieve progress.  OBJECTIVE IMPAIRMENTS: Objective impairments include attention, memory, executive functioning, dysarthria, and dysphagia. These impairments are limiting patient from effectively communicating at home and in community and safety when swallowing.Factors affecting potential to achieve goals and functional outcome are ability to learn/carryover information and co-morbidities.. Patient will benefit from skilled SLP services to address above impairments and improve overall function.  REHAB POTENTIAL: Good  PLAN:  SLP FREQUENCY: 2x/week  SLP DURATION: 8 weeks  PLANNED INTERVENTIONS: Aspiration precaution training, Pharyngeal strengthening exercises, Diet toleration management , Environmental controls, Trials of upgraded texture/liquids, Cueing hierachy, Internal/external aids, Functional tasks, Multimodal communication approach, SLP instruction and feedback, Compensatory strategies, Patient/family education, and Re-evaluation    Saja Bartolini, CCC-SLP 10/21/2022, 2:04 PM

## 2022-10-21 NOTE — Therapy (Signed)
OUTPATIENT OCCUPATIONAL THERAPY [facility]IN[facility]  Treatment Note  Patient Name: Donald [name] MRN: [id number] DOB:[birth year], 82 y.o., male Today's Date: 10/21/2022  PCP: Gaspar Garbe, MD REFERRING PROVIDER: Vladimir Faster, DO  END OF SESSION:  OT End of Session - 10/21/22 1507     Visit Number 8    Number of Visits 17    Date for OT Re-Evaluation 11/27/22    Authorization Type [employer]    OT Start Time 1451    OT Stop Time 1530    OT Time Calculation (min) 39 min    Activity Tolerance No increased pain;Patient tolerated treatment well    Behavior During Therapy [facility] for tasks assessed/performed;Impulsive              Past Medical History:  Diagnosis Date   Abnormality of gait 08/27/2014   Anxiety    Aortic stenosis    s/p AVR by Dr Laneta Simmers   Arthritis    left wrist- probable- not diagonised   Asthma    in the past   CAD (coronary artery disease)    s/p CABG 2006   GERD (gastroesophageal reflux disease)    H/O seasonal allergies    H/O: rheumatic fever    Hearing deficit    Bilateral hearing aids   Hematuria    had CT scan   Hiatal hernia    History of kidney stones    HOH (hard of hearing)    Hyperlipidemia    Hypertension    Hyperthyroidism    Inguinal hernia    right side; watching at this time per wife's report   Memory difficulty 08/27/2014   Pacemaker    Parkinson's disease    Persistent atrial fibrillation (HCC)    Scarlet fever    as a child   Shortness of breath    Sick sinus syndrome (HCC)    s/p PPM (MDT) 2006   Past Surgical History:  Procedure Laterality Date   AORTIC VALVE REPLACEMENT  2006   CARDIOVERSION N/A 09/14/2013   Procedure: CARDIOVERSION;  Surgeon: Thurmon Fair, MD;  Location: [facility] ENDOSCOPY;  Service: Cardiovascular;  Laterality: N/A;   CHOLECYSTECTOMY N/A 07/21/2013   Procedure: LAPAROSCOPIC CHOLECYSTECTOMY;  Surgeon: Mariella Saa, MD;  Location: [facility] OR;  Service: General;  Laterality: N/A;   COLONOSCOPY  W/ POLYPECTOMY     COLONOSCOPY WITH PROPOFOL N/A 08/31/2016   Procedure: COLONOSCOPY WITH PROPOFOL;  Surgeon: Charlott Rakes, MD;  Location: [facility] ENDOSCOPY;  Service: Endoscopy;  Laterality: N/A;   CORONARY ARTERY BYPASS GRAFT  2006   DENTAL SURGERY     implants   INSERT / REPLACE / REMOVE PACEMAKER  2006, 2012   MDT for sick sinus syndrome, gen change 8/12 by [facility]   KIDNEY STONE SURGERY  1984   Patient Active Problem List   Diagnosis Date Noted   HOH (hard of hearing) 12/18/2020   Personal history of colonic polyps 08/31/2016   Abnormality of gait 08/27/2014   Memory difficulty 08/27/2014   Permanent atrial fibrillation (HCC) 09/08/2013   Cholelithiases 07/19/2013   Cholelithiasis 07/19/2013   Encounter for therapeutic drug monitoring 07/03/2013   Mechanical aortic valve 04/05/2013   Parkinson disease 09/27/2012   Orthostatic hypotension 04/01/2012   Pacemaker-Medtronic 03/29/2012   HEPATITIS A 06/04/2010   HYPERTHYROIDISM 06/04/2010   HYPERLIPIDEMIA 06/04/2010   Essential hypertension 06/04/2010   Coronary artery disease involving native coronary artery of native heart without angina pectoris 06/04/2010   SICK SINUS SYNDROME 06/04/2010   HIATAL HERNIA  06/04/2010   SHORTNESS OF BREATH 06/04/2010   CHEST PAIN 06/04/2010    ONSET DATE: referral date 09/01/22 (PD diagnosis 2014)  REFERRING DIAG: G20.B2 (ICD-10-CM) - Parkinson's disease with dyskinesia and fluctuating manifestations  THERAPY DIAG:  Other symptoms and signs involving the nervous system  Unsteadiness on feet  Other lack of coordination  Muscle weakness (generalized)  Rationale for Evaluation and Treatment: Rehabilitation  SUBJECTIVE:   SUBJECTIVE STATEMENT: "I could use a nap"  Pt accompanied by: self & spouse [name] in waiting area   PERTINENT HISTORY: Parkinson's Disease. PMH: hx of falls (wears helment), arthritis, asthma, CAD, GERD, HOH, HLD, HTN, hyperthyroidism, anxiety, aortic stenosis, hx of  aortic valve replacement 2006, memory difficulty, pacemaker, persistent a-fib, hx of scarlet fever, shortness of breath, hx of CABG 2006, cholecystectomy 2015, neuropathy  PRECAUTIONS: Fall; WEIGHT BEARING RESTRICTIONS: No  PAIN:  Are you having pain? None today reported  FALLS: Has patient fallen in last 6 months? Yes. Number of falls fall frequently, reports falling 3x today  LIVING ENVIRONMENT: Lives with: lives with their spouse Lives in: House/apartment Stairs:  ramped entrance Has following equipment at home: Grab bars, Ramped entry, and U-step walker  PLOF: Independent with basic ADLs and Requires assistive device for independence  PATIENT GOALS: to work on his balance   OBJECTIVE:  (All objective assessments below are from initial evaluation on: 09/28/22 unless otherwise specified.)   HAND DOMINANCE: Left  ADLs: Overall ADLs: Pt reports it takes about 30 mins to get dressed, but nobody helps him with that.  Reports that he does not need any help with any bathroom transfers.  He utilizes a hook on pants button instead of buttoning and wears step in shoes to eliminate tying shoes. Transfers/ambulation related to ADLs: Utilizes U-step walker  Equipment: Grab bars and Walk in shower  IADLs: Light housekeeping: changes bed linen Medication management: wife administers Handwriting: 75% legible, shaky  MOBILITY STATUS: Hx of falls and ambulates with U-step walker and falls frequently  POSTURE COMMENTS:  rounded shoulders and forward head  FUNCTIONAL OUTCOME MEASURES: Fastening/unfastening 3 buttons: 5:16.46 (Pt utilizing all fingers intermittently to attempt to fasten buttons) Physical performance test: PPT#2 (simulated eating) 22.93 sec & PPT#4 (donning/doffing jacket): 2:41.1 (attempted for 45 seconds with inability to thread 2nd arm, therefore switched arms)  COORDINATION: 9 Hole Peg test: Right: 2.43.9; Left: 3:47.9 (pt dropping multiple pegs and benefiting from  picking up from table top by sliding off edge of table)  10/16/22: 9 hole peg test: R: 4:58 (pt dropping multiple pegs and transferring them from finger tips to palm and then attempting to place with gross grasp); L: 6:07  09/30/22: Box and blocks: L: 28 blocks, R: 37 blocks 10/19/22: box and blocks: L: 23 blocks, then 28 blocks and R: 43 blocks  UE ROM:   R shoulder flexion 110*, L shoulder flexion 110*  SENSATION: "Feels like ice"  COGNITION: Overall cognitive status: No family/caregiver present to determine baseline cognitive functioning and HOH makes this determination more difficult  OBSERVATIONS:    10/14/22: today he presents less impulsive than last session- more sedate, quite and slow. More typical of PD.  He is still toe-walking.    TODAY'S TREATMENT:  10/21/22 Large amplitude: utilizing Resistance Clothespins 6# with LUE for low and high functional reaching in standing at high table.  Focus placed on functional reaching with LUE with focus on large amplitude movements of feet apart, incorporating weight shift and trunk rotation, and opening hand.  Pt with difficulty with timing of grasp and needing assistance of table top for improved grasp with LUE.  Self-feeding: Engaged in scooping dried beans with and without built up foam handle, no difference noted therefore removed foam handle.  Pt completed PPT#2 in 11.79 sec with significant improvement from evaluation.  Coordination: Placing various sized pegs in arc pegboard with each UE with min-mod cueing for shoulder compensation and for tip pinch (particularly with L hand) as pt with tendency to utilize cylindrical grasp with LUE.  Pt with improved in-hand manipulation, but increased difficulty with smallest pegs. OT placing wash cloth under pegs to add friction to increase success with picking up smallest  pegs. Ambulation: pt demonstrating toe walking and shuffling gait into treatment space, however after session pt with initial toe walking but with cues for steady gait pt demonstrating improved step length and able to carry out to entrance of clinic without additional cues.    10/19/22 Large amplitude: engaged in picking up and transferring rings from cone to cone with focus on elbow extension and large hand opening when grasping rings.  Cones placed to facilitate increased elbow extension and reaching across midline and outside BOS.  Completed in sitting progressing to standing.  In standing with feet apart and staggered stance for improved BOS and decrease need of stepping with trunk rotation.  OT providing CGA throughout dynamic standing task. Coordination: Engaged in translation from palm to finger tips with Connect 4 piece.  Pt able to translate from finger tips to palm, but unable to translate back to finger tips without turning over palm and using momentum and gravity.  Transitioned back to marker with pt still demonstrating difficulty with translating palm to finger tips as well.  OT providing demonstration and min-mod cues to facilitate large opening of hand with manipulation of marker and Connect 4 piece.  OT demonstrating rotation with twirling large pen both directions.  Pt with significant difficulty with rotation, however demonstrating near ability to complete on R compared to unable to sequence/complete on L.   Box and blocks: L: 23 blocks, then 28 blocks and R: 43 blocks   10/16/22 Connect 4: engaged in picking up and placing connect 4 pieces into grid with each hand incorporating midrange reach for increased elbow extension with mod cueing for large amplitude movements to open hand. Pt demonstrating decreased hypokinesia with repetition and setup of task. Coordination: as pt demonstrating improved motor control and grasp with Connect 4 attempted 9 hole peg test bilaterally.  Pt requiring  significantly increased time to complete.  Noted frequent dropping of pegs and dropping from precision pinch into hand - therefore educated pt on translation from palm <> finger tips with marker and use of Connect 4 piece.  Pt able to complete with increased time and effort, but with repetition demonstrating some carryover.      PATIENT EDUCATION: Education details: ongoing condition specific education Person educated: Patient and Spouse Education method: Explanation Education comprehension: verbalized understanding and needs further education  HOME EXERCISE PROGRAM: Access Code: [email or web address] URL: [email or web address] Date: 10/07/2022 Prepared by: Fannie Knee   GOALS: Goals reviewed with patient? Yes  SHORT TERM GOALS: Target date: 10/30/22  Pt will perform PD-specific HEP with cueing prn  from caregiver  Baseline: Goal status: IN PROGRESS  2.  Pt will improve R hand coordination for ADLs (buttoning/tying shoes) as shown by completing 9-hole peg test in 2 min or less  Baseline: Right: 2.43.9; Left: 3:47.9  Goal status: IN PROGRESS  3.  Pt will improve L hand coordination for ADLs (buttoning/tying shoes) as shown by completing 9-hole peg test in 3 mins or less  Baseline: Right: 2.43.9; Left: 3:47.9  Goal status: IN PROGRESS  4.  Pt/caregiver will verbalize understanding of ways to decr risk of future complcations related to PD (including falls).  Baseline:  Goal status: IN PROGRESS   LONG TERM GOALS: Target date: 11/27/22  Pt/caregiver will verbalize understanding of AE/strategies to increase ease, safety, and independence with ADLs/IADLs  Baseline:  Goal status: IN PROGRESS  2.  Pt will improve functional reaching/coordination for ADLs as shown by improving score on box and blocks test by at least 5 with dominant LUE  Baseline:  L: 28 blocks, R: 37 blocks Goal status: IN PROGRESS  3.  Pt will complete 3 button/unbutton in 4 mins or less with use of AE PRN  to demonstrate increased coordination and motor control to manage clothing fasteners. Baseline:  5:16.46  Goal status: IN PROGRESS  4.  Pt will demonstrate improved ease with feeding as evidenced by decreasing PPT#2 (self feeding) by 3 secs Baseline: PPT#2 (simulated eating) 22.93 sec  Goal status: IN PROGRESS  5.  Pt will demonstrate ability to retrieve a lightweight object at moderate range to increase shoulder flexion. Baseline:  Goal status: IN PROGRESS  ASSESSMENT:  CLINICAL IMPRESSION: Pt demonstrating mild improvements in elbow extension with reaching activities in sitting and standing as well as large hand opening with grasp and release of items this session.  OT facilitating increased challenge with standing, incorporating trunk rotation, with focus on foot placement for improved stance and BOS to aid in decreasing falls.    PLAN:  OT FREQUENCY: 2x/week  OT DURATION: 8 weeks  PLANNED INTERVENTIONS: self care/ADL training, therapeutic exercise, therapeutic activity, neuromuscular re-education, passive range of motion, balance training, functional mobility training, ultrasound, compression bandaging, moist heat, cryotherapy, patient/family education, cognitive remediation/compensation, psychosocial skills training, energy conservation, coping strategies training, and DME and/or AE instructions  CONSULTED AND AGREED WITH PLAN OF CARE: Patient and family member/caregiver  PLAN FOR NEXT SESSION: Focus on cognitive/safety tasks, as well as FMS and also standing balance/coordination activities.    Rosalio Loud, OTR/L 10/21/2022, 3:09 PM

## 2022-10-23 NOTE — Therapy (Signed)
OUTPATIENT PHYSICAL THERAPY NEURO TREATMENT  Patient Name: Donald [name] MRN: [id number] DOB:1940/07/12, 82 y.o., male Today's Date: 10/23/2022   PCP: Gaspar Garbe, MD REFERRING PROVIDER: Tat, Octaviano Batty, DO  END OF SESSION:        Past Medical History:  Diagnosis Date   Abnormality of gait 08/27/2014   Anxiety    Aortic stenosis    s/p AVR by Dr Laneta Simmers   Arthritis    left wrist- probable- not diagonised   Asthma    in the past   CAD (coronary artery disease)    s/p CABG 2006   GERD (gastroesophageal reflux disease)    H/O seasonal allergies    H/O: rheumatic fever    Hearing deficit    Bilateral hearing aids   Hematuria    had CT scan   Hiatal hernia    History of kidney stones    HOH (hard of hearing)    Hyperlipidemia    Hypertension    Hyperthyroidism    Inguinal hernia    right side; watching at this time per wife's report   Memory difficulty 08/27/2014   Pacemaker    Parkinson's disease    Persistent atrial fibrillation (HCC)    Scarlet fever    as a child   Shortness of breath    Sick sinus syndrome (HCC)    s/p PPM (MDT) 2006   Past Surgical History:  Procedure Laterality Date   AORTIC VALVE REPLACEMENT  2006   CARDIOVERSION N/A 09/14/2013   Procedure: CARDIOVERSION;  Surgeon: Thurmon Fair, MD;  Location: [facility] ENDOSCOPY;  Service: Cardiovascular;  Laterality: N/A;   CHOLECYSTECTOMY N/A 07/21/2013   Procedure: LAPAROSCOPIC CHOLECYSTECTOMY;  Surgeon: Mariella Saa, MD;  Location: [facility] OR;  Service: General;  Laterality: N/A;   COLONOSCOPY W/ POLYPECTOMY     COLONOSCOPY WITH PROPOFOL N/A 08/31/2016   Procedure: COLONOSCOPY WITH PROPOFOL;  Surgeon: Charlott Rakes, MD;  Location: [facility] ENDOSCOPY;  Service: Endoscopy;  Laterality: N/A;   CORONARY ARTERY BYPASS GRAFT  2006   DENTAL SURGERY     implants   INSERT / REPLACE / REMOVE PACEMAKER  2006, 2012   MDT for sick sinus syndrome, gen change 8/12 by [facility]   KIDNEY STONE SURGERY  1984   Patient  Active Problem List   Diagnosis Date Noted   HOH (hard of hearing) 12/18/2020   Personal history of colonic polyps 08/31/2016   Abnormality of gait 08/27/2014   Memory difficulty 08/27/2014   Permanent atrial fibrillation (HCC) 09/08/2013   Cholelithiases 07/19/2013   Cholelithiasis 07/19/2013   Encounter for therapeutic drug monitoring 07/03/2013   Mechanical aortic valve 04/05/2013   Parkinson disease 09/27/2012   Orthostatic hypotension 04/01/2012   Pacemaker-Medtronic 03/29/2012   HEPATITIS A 06/04/2010   HYPERTHYROIDISM 06/04/2010   HYPERLIPIDEMIA 06/04/2010   Essential hypertension 06/04/2010   Coronary artery disease involving native coronary artery of native heart without angina pectoris 06/04/2010   SICK SINUS SYNDROME 06/04/2010   HIATAL HERNIA 06/04/2010   SHORTNESS OF BREATH 06/04/2010   CHEST PAIN 06/04/2010    ONSET DATE: 2014  REFERRING DIAG:  G20.B2 (ICD-10-CM) - Parkinson's disease with dyskinesia and fluctuating manifestations    THERAPY DIAG:  No diagnosis found.  Rationale for Evaluation and Treatment: Rehabilitation  SUBJECTIVE:  SUBJECTIVE STATEMENT: "Where are we going?" No pain today.  Pt accompanied by: self-spouse in lobby  PERTINENT HISTORY: (from recent PT screen)  Timed Up and Go test:  29.82 sec with U-step (compared to 21.41 sec at d/c)  10 meter walk test: 24.5 sec = 1.34 ft/sec (compared to 2.25 ft/sec at d/c)  5 time sit to stand test: 12 sec with UE support (compared 19.63 sec)  PAIN:  Are you having pain? No  PRECAUTIONS: Fall, HOH  WEIGHT BEARING RESTRICTIONS: No  FALLS: Has patient fallen in last 6 months? Yes. Number of falls numerous/often multiple per day  LIVING ENVIRONMENT: Lives with: lives with their spouse Lives in:  House/apartment Stairs:  reports ramp access in/out Has following equipment at home: Dan Humphreys - 4 wheeled and U-step  PLOF: Requires assistive device for independence and Needs assistance with homemaking  PATIENT GOALS: Improve balance and walking  OBJECTIVE:      TODAY'S TREATMENT: 10/26/22 Activity Comments                        Access Code: [email or web address] URL: [email or web address] Date: 10/07/2022 Prepared by: [facility] - [facility]  Exercises - Seated Hamstring Stretch  - 2 x daily - 5 x weekly - 1 sets - 3 reps - 10 sec hold - Seated Ankle Dorsiflexion AROM  - 1-2 x daily - 7 x weekly - 2 sets - 5 reps - 3 sec hold    ----------------------------------------------------------------------------- Objective measures below taken at initial evaluation:  DIAGNOSTIC FINDINGS:   COGNITION: Overall cognitive status: No family/caregiver present to determine baseline cognitive functioning, HOH makes this determination more difficult   SENSATION: WFL  COORDINATION: Grossly impaired    MUSCLE TONE: ratcheting in BLE  MUSCLE LENGTH: Hamstrings: Right -20 deg; Left -20 deg     POSTURE: rounded shoulders and forward head  LOWER EXTREMITY ROM:     Active  Right Eval Left Eval  Hip flexion    Hip extension    Hip abduction    Hip adduction    Hip internal rotation    Hip external rotation    Knee flexion -10 -10  Knee extension    Ankle dorsiflexion 8 11  Ankle plantarflexion    Ankle inversion    Ankle eversion     (Blank rows = not tested)  LOWER EXTREMITY MMT:    Grossly 4/5 BLE  BED MOBILITY:  NT  TRANSFERS: Assistive device utilized: Environmental consultant - 4 wheeled  Sit to stand: Modified independence and CGA Stand to sit: Modified independence and SBA Chair to chair: Modified independence and CGA Floor:  NT  RAMP:  NT  CURB:  Level of Assistance: CGA Assistive device utilized: Environmental consultant - 4 wheeled Curb  Comments:   STAIRS: NT  GAIT: Gait pattern: shuffling and festinating Distance walked:  Assistive device utilized:  U-step Level of assistance: CGA and Min A Comments:   FUNCTIONAL TESTS:  5 times sit to stand: 33 sec w/ retro-LOB, pushing backs of knees to chair, multiple attempts needed Timed up and go (TUG): 23 sec. TUG w/ cog: 32.91 sec 10 meter walk test: 18.12 sec = 1.8 ft/sec  Mini-BESTest: 7/28      PATIENT EDUCATION: Education details: assessment findings Person educated: Patient Education method: Explanation Education comprehension: verbalized understanding  HOME EXERCISE PROGRAM: TBD  GOALS: Goals reviewed with patient? Yes  SHORT TERM GOALS: Target date: 10/19/2022    Patient will perform HEP  with family/caregiver supervision for improved strength, balance, transfers, and gait  Baseline: Goal status: IN PROGRESS  2.  Manifest improved balance and BLE strength per 20 sec 5xSTS test without compensatory movements Baseline: 33 sec w/ compensations Goal status: IN PROGRESS    LONG TERM GOALS: Target date: 11/09/2022    Reduce risk for falls per score 18/28 Mini-BESTest Baseline: 7/28 Goal status: IN PROGRESS  2.  Demo improved efficiency and safety with ambulation maintaining speed of 2.25 ft/sec w/ least restrictive AD to improve community ambulation Baseline: 1.8 ft/sec Goal status: IN PROGRESS  3.  Demo improved safety with gait and negotiating turns per time of 15 sec TUG test Baseline: 23 sec w/ U-step Goal status: IN PROGRESS    ASSESSMENT:  CLINICAL IMPRESSION: Patient arrived to session with wife for caregiver education on PWR! Moves for participation at home. Patient benefited from demo and verbal cues for these activities. Gait training required cues for increased step length on R LE. Patient and wife reported understanding of edu provided and without complaints upon leaving.   OBJECTIVE IMPAIRMENTS: Abnormal gait, decreased activity  tolerance, decreased balance, decreased coordination, decreased endurance, decreased mobility, difficulty walking, decreased ROM, decreased strength, impaired flexibility, impaired tone, improper body mechanics, and postural dysfunction.   ACTIVITY LIMITATIONS: carrying, lifting, bending, standing, transfers, reach over head, and locomotion level  PARTICIPATION LIMITATIONS: meal prep, cleaning, laundry, driving, shopping, community activity, and yard work  PERSONAL FACTORS: Age, Time since onset of injury/illness/exacerbation, and 3+ comorbidities: PMH  are also affecting patient's functional outcome.   REHAB POTENTIAL: Good  CLINICAL DECISION MAKING: Evolving/moderate complexity  EVALUATION COMPLEXITY: Moderate  PLAN:  PT FREQUENCY: 1-2x/week  PT DURATION: 6 weeks  PLANNED INTERVENTIONS: Therapeutic exercises, Therapeutic activity, Neuromuscular re-education, Balance training, Gait training, Patient/Family education, Self Care, Joint mobilization, Stair training, Vestibular training, Canalith repositioning, DME instructions, Aquatic Therapy, Dry Needling, Electrical stimulation, Spinal mobilization, Cryotherapy, Moist heat, Taping, Traction, Ultrasound, Ionotophoresis 4mg /ml Dexamethasone, and Manual therapy  PLAN FOR NEXT SESSION: continue with seated PWR moves, seated exercises, standing march, kicks with cues for SLOWED motion; functional gait with U-step rollator-forward/back walking, turns    Anette Guarneri, PT, DPT 10/23/22 9:07 AM  [facility] [address] [place], [place] [address] Phone # [phone] Fax # [phone]

## 2022-10-26 ENCOUNTER — Encounter: Payer: Self-pay | Admitting: Physical Therapy

## 2022-10-26 ENCOUNTER — Ambulatory Visit: Payer: PPO | Admitting: Physical Therapy

## 2022-10-26 ENCOUNTER — Encounter: Payer: Self-pay | Admitting: Neurology

## 2022-10-26 ENCOUNTER — Ambulatory Visit: Payer: PPO

## 2022-10-26 DIAGNOSIS — R1312 Dysphagia, oropharyngeal phase: Secondary | ICD-10-CM

## 2022-10-26 DIAGNOSIS — R2681 Unsteadiness on feet: Secondary | ICD-10-CM

## 2022-10-26 DIAGNOSIS — R29818 Other symptoms and signs involving the nervous system: Secondary | ICD-10-CM | POA: Diagnosis not present

## 2022-10-26 DIAGNOSIS — R471 Dysarthria and anarthria: Secondary | ICD-10-CM

## 2022-10-26 DIAGNOSIS — M6281 Muscle weakness (generalized): Secondary | ICD-10-CM

## 2022-10-26 DIAGNOSIS — R278 Other lack of coordination: Secondary | ICD-10-CM

## 2022-10-26 DIAGNOSIS — R41841 Cognitive communication deficit: Secondary | ICD-10-CM

## 2022-10-26 NOTE — Therapy (Signed)
OUTPATIENT SPEECH LANGUAGE PATHOLOGY TREATMENT   Patient Name: Donald [name] MRN: [id number] DOB:1940-12-10, 82 y.o., male Today's Date: 10/26/2022  PCP: Gaspar Garbe, MD REFERRING PROVIDER: Vladimir Faster, DO  END OF SESSION:  End of Session - 10/26/22 1448     Visit Number 9    Number of Visits 17    Date for SLP Re-Evaluation 11/23/22    SLP Start Time 1404    SLP Stop Time  1442    SLP Time Calculation (min) 38 min    Activity Tolerance Patient tolerated treatment well                  Past Medical History:  Diagnosis Date   Abnormality of gait 08/27/2014   Anxiety    Aortic stenosis    s/p AVR by Dr Laneta Simmers   Arthritis    left wrist- probable- not diagonised   Asthma    in the past   CAD (coronary artery disease)    s/p CABG 2006   GERD (gastroesophageal reflux disease)    H/O seasonal allergies    H/O: rheumatic fever    Hearing deficit    Bilateral hearing aids   Hematuria    had CT scan   Hiatal hernia    History of kidney stones    HOH (hard of hearing)    Hyperlipidemia    Hypertension    Hyperthyroidism    Inguinal hernia    right side; watching at this time per wife's report   Memory difficulty 08/27/2014   Pacemaker    Parkinson's disease    Persistent atrial fibrillation (HCC)    Scarlet fever    as a child   Shortness of breath    Sick sinus syndrome (HCC)    s/p PPM (MDT) 2006   Past Surgical History:  Procedure Laterality Date   AORTIC VALVE REPLACEMENT  2006   CARDIOVERSION N/A 09/14/2013   Procedure: CARDIOVERSION;  Surgeon: Thurmon Fair, MD;  Location: [facility] ENDOSCOPY;  Service: Cardiovascular;  Laterality: N/A;   CHOLECYSTECTOMY N/A 07/21/2013   Procedure: LAPAROSCOPIC CHOLECYSTECTOMY;  Surgeon: Mariella Saa, MD;  Location: [facility] OR;  Service: General;  Laterality: N/A;   COLONOSCOPY W/ POLYPECTOMY     COLONOSCOPY WITH PROPOFOL N/A 08/31/2016   Procedure: COLONOSCOPY WITH PROPOFOL;  Surgeon: Charlott Rakes, MD;   Location: [facility] ENDOSCOPY;  Service: Endoscopy;  Laterality: N/A;   CORONARY ARTERY BYPASS GRAFT  2006   DENTAL SURGERY     implants   INSERT / REPLACE / REMOVE PACEMAKER  2006, 2012   MDT for sick sinus syndrome, gen change 8/12 by [facility]   KIDNEY STONE SURGERY  1984   Patient Active Problem List   Diagnosis Date Noted   HOH (hard of hearing) 12/18/2020   Personal history of colonic polyps 08/31/2016   Abnormality of gait 08/27/2014   Memory difficulty 08/27/2014   Permanent atrial fibrillation (HCC) 09/08/2013   Cholelithiases 07/19/2013   Cholelithiasis 07/19/2013   Encounter for therapeutic drug monitoring 07/03/2013   Mechanical aortic valve 04/05/2013   Parkinson disease 09/27/2012   Orthostatic hypotension 04/01/2012   Pacemaker-[employer]tr[employer] 03/29/2012   HEPATITIS A 06/04/2010   HYPERTHYROIDISM 06/04/2010   HYPERLIPIDEMIA 06/04/2010   Essential hypertension 06/04/2010   Coronary artery disease involving native coronary artery of native heart without angina pectoris 06/04/2010   SICK SINUS SYNDROME 06/04/2010   HIATAL HERNIA 06/04/2010   SHORTNESS OF BREATH 06/04/2010   CHEST PAIN 06/04/2010    ONSET  DATE: referred 09/01/22  REFERRING DIAG: G20.B2 (ICD-10-CM) - Parkinson's disease with dyskinesia and fluctuating manifestations   THERAPY DIAG:  Dysarthria and anarthria  Cognitive communication deficit  Dysphagia, oropharyngeal phase  Rationale for Evaluation and Treatment: Rehabilitation  SUBJECTIVE:   SUBJECTIVE STATEMENT: "Did you hear me hollerin'?" (Pt, re: in PT) Per pt's exercise sheet, pt did not complete any HEP yesterday, and has once today Pt accompanied by: self    PERTINENT HISTORY: Dx wtih PD 2012. MBSS in July 2020 revealed moderate dysphagia with significant pharyngeal residue, silent aspiration, reduce tongue base, epiglittic inversion, pharyngeal contraction, hocking and drool. Prior ST course 2022 at [facility].   PAIN:  Are you having pain?  No  FALLS: Has patient fallen in last 6 months?  See PT evaluation for details   PATIENT GOALS: swallow and talk better  OBJECTIVE:   DIAGNOSTIC FINDINGS:  MBSS 10/09/22 Clinical Impression: Pt exhibits mild oral and severe pharyngeal dysphagia which has mild-moderately progressed since MBS in 2022. Orally he was able to control and propel boluses with mild delays and lingual residue. He silently aspirated thin, nectar and puree consistencies due to decreased hyoid excursion, laryngeal elevation and laryngeal vestibular closure. Nectar thick was aspirated before laryngeal closure could be achieved. Cues for strong cough did not clear aspirates and only briefly cleared penetrates before barium reappeared on vocal cords.  Reduced tongue base retraction, decreased epiglottic deflection and pharyngeal stripping wave led to max vallecular and pyriform sinus residue with puree and nectar. Multiple swallows were ineffective to decrease residue however chin tuck did appear to minimally reduce residue. Chin tuck during the swallow was not effective to protect airway nor were right or left head turns. Options were discussed with pt and wife. His wishes are to continue eating and drinking and she states pt has not had pneumonia. Continued education re: risk if pt were to experience an illness or injury that risk of pneumona and decompnesation increases. Pt to continue regular texture,thin liquids, multiple swallows with chin tuck, hard coughs after swallows, alternate liquids and solids, remain upright 1 hour after meals and crush pills. Factors that may increase risk of adverse event in presence of aspiration [name] [employer] [name] 2021): Factors that may increase risk of adverse event in presence of aspiration [name] & [name] 2021): Frail or deconditioned; Weak cough; Aspiration of thick, dense, and/or acidic materials; Frequent aspiration of large volumes Recommendations/Plan: Swallowing Evaluation  Recommendations PO Diet Recommendation: Regular; Thin liquids (Level 0) Liquid Administration via: Cup Medication Administration: Crushed with puree Supervision: Patient able to self-feed; Full supervision/cueing for swallowing strategies Swallowing strategies  : Slow rate; Small bites/sips; Chin tuck; Follow solids with liquids; Multiple dry swallows after each bite/sip; Hard cough after swallowing Postural changes: Stay upright 30-60 min after meals; Position pt fully upright for meals Oral care recommendations: Oral care BID (2x/day)  PATIENT REPORTED OUTCOME MEASURES (PROM): PROM was completed (CES) with a score of 13/32. Higher scores indicate greater effectiveness of communication.   TODAY'S TREATMENT:  DATE:  10/26/22: Pt was out of town Thurs-Saturday and has no checkmarks on his homework sheet since the mark SLP put during [name]'s last ST session on 10/21/22.  Pt benefited from simultaneous production of loud /a/ for first 4/5 reps. Reps very short in length despite SLP consistent cues for full breath and simultaneous production with SLP. [place] read his everyday sentences with consistent mod-max A for adequate breath support for each sentence - pt attempted to read off 2-3 sentences on one breath. In conversation with SLP pt benefited from usual max A for louder speech.   SLP wanted to bring [facility] back today for remainder of session, in order to ask how Bordonaro was doing at home with swallow precautions and louder speech but she was not in lobby when SLP went out at 1433. SLP asked [name] about joining in ST session next time for 10-15 minutes and she said she would be happy to do that.   10/21/22: SWALLOW: Given pt's lack of adherence to speech HEP SLP does not think pt will perform swallowing HEP with any frequency to foster increasing muscular strength  for swallowing. SLP assisted pt when eating fig bar and drinking water. Pt req'd consistent cues for all precautions but small sips/bites. When SLP cued pt to cough after small bite fig bar he coughed the entire bite up. SLP told wife this and strongly urged her to attend pt while he is eating and provide him with cues for following precautions. Later in session when SLP charting, Trentman took sip and SLP turned around to see pt taking consecutive sips from a straw and promptly coughed immediately afterwards. SLP used this example to reiterate to pt to use small sips. SPEECH: Loud /a/ produced today simultaneously with SLP for first 3 reps, and pt independent on last two productions, all with < 5 second length. SLP cued [place] to perform his sentences and req'd consistent mod A for breath support for each sentence - pt wanted to read off as many as possible on one breath. SLP worked with pt in functional context for speech - short 1-2 minute conversational segments. Pt talked about family with occasional min-mod A for "shout so I can hear you."  10/19/22: Pt, in OT session, was watched by SLP to see if he followed any aspiration precautions with water. He did not. SLP reminded him he needed to take small sips, swallow at least twice, and cough and reswallow last swallow. In simple 7 minute conversation about events yesterday pt req'd usual mod-max A for speech intelligibility. Frequency of cueing did not lessen cueing level needed by SLP.  Pt had three days where he completed HEP BID in the last week. With /a/, pt benefited from simultaneous SLP production, as well as simultaneous production with SLP with cues for breath before each sentence with everyday sentences. SLP instructed pt to repeat sentences and reminded him he needs to shout with every response he provides. He told SLP [name] asks him to repeat almost everything he says - SLP correlated the fact he needs to shout with being more intelligible and  [name] won't have to ask him to repeat as often if he shouts everything he says. He acknowledged understanding. The next 7 minutes SLP engaged pt in conversation and intelligibility improved to 70%, and 80% if SLP nonverbally cued pt he was not understood.   10/16/22: SPEECH: 8 minute simple conversation with pt - pt req'd usual mod A for speech intelligibility strategy usage -without pt  using overarticulation and louder speech SLP understood 80%. SLP noted wife demonstrated understanding of pt 95% of the time. SWALLOW: Pt req'd initial consistent min-mod A for following precautions (except small bites/sips) with Dys III/thin, faded to usual min A for double swallows, cough, and alternate bite/sip.  10/14/22: SWALLOW: Pt with MBS 10/09/22. Results above. SLP strongly reiterated results of exam with pt/wife. SLP practiced recommendations with POs with pt, who required mod A usually for chin tuck, double swallows, and cough. Fading cues to min A occasionally for other precautions. Pt took small bites until end of session when SLP was not looking and then he took x2 bite size that he had been taking with SLP observing. SLP told pt to take smaller bites to mitigate risk of aspiration. SLP informed pt and wife that wife may need to assist pt during meals. SLP asked Rineer if this was ok and he agreed. Pt is not performing speech intelligibilty exercises as directed - SLP is cautious to recommend HEP for dysphagia, suspecting pt will also not complete as directed. SPEECH: He performed loud /a/ with consistent simultaneous production and usual mod cues for full breath before production. Held loud /a/ for 5 seconds average. [name] got out pt's sentences for him and he req'd consistent max cues for taking breath for each one and producing loud speech for each of them. SLP looked at pt's practice sheet and he is performing once/day. SLP strongly stressed BID completion and told pt and wife rationale.   10/07/22: Pt  told SLP MBS scheduled for Friday 10/09/22. Arrived with "wet" voice and took x3 attempts to clear. Once cleared, pt did not have to clear again for remainder of session. SLP provided max A for /a/ x10 - simultaneous production needed. SLP provided max A to slow down and read sentences using one breath per sentence, and shout sentences. SLP worked remainder of session with consistent mod cues to "shout" when pt speaks in sentence responses. When doing so, SLP understood [name] 75% of the time, however even with a cue to shout, Winings only used incr'd volume and articulation 70% of the time. When not "shouting", SLP understood [name] ~20% of the time.  Pt had reportedly completed exercises twice yesterday.   10/05/22: SLP cont to require to use very loud speech volume for pt to demonstrate understanding of SLP speech. Since last session pt has completed /a/ and sentences once/day, with two days BID. SLP told pt to complete every day BID.  Simultaneous production necessary for loud /a/ - otherwise pt with loudness decay after 2-3 seconds. SLP told [name]) she may need to complete loud /a/ with pt based upon today's production/s. SLP wonders the effectiveness of pt's practice thus far, if wife is not assisting pt. SLP cont to remind pt throughout session to "shout, so we can understand you" and that pt normal speech is mumbling in nature and this cannot be understood. Pt benefited from occasional cues to incr volume for the remaining time throughout the session (14 minutes) to "shout". [name] endorsed it was easier to hear pt during session today than it is at home, and especially in the car. SLP told [name] to use "shout" as a cue for pt at home and also in the car, to see if this improves pt's loudness/clarity.  09/30/22: Pt wife waiting on a call back from scheduler for MBSS. SLP required to provide a very loud speaking volume for pt to respond, or ask SLP for repeat. [name] stated  pt had hearing  aids serviced "about a month ago." SLP may suggest return to hearing aid dispenser for service/checkup. Max cues needed for loud /a/ including modeling. Pt average loudness upper 80s dB with average 6 seconds, even with max cues for longer production. SLP told pt 10 reps, BID. SLP educated pt/wife that pt and wife should generate 10 everyday sentences to practice at home BID after the loud /a/ reps. When SLP inquired how consistent pt is with performing other HEPs in the past [name] stated, "He isn't too good with doing (the HEP) at home." SLP reiterated that Scroggin must do HEP at least 5 days/week for there to be any possibility of progress with speech intelligibility and/or with swallow strength.SLP provided pt/wife with HEP checklist for completion with "ahhhhhh" and "everyday sentences" slots, to encourage HEP completion. At the end of session wife provided Rutkowski with a sip of water and pt immediately coughed after an audible swallow.  09/28/22: Initiated training on dysarthria strategies: slow, loud, over-articulate, with practice at phrase reading and generative levels. Direct model required to optimize production and improve intelligibility. Education on POC and goals, to which pt and spouse verbalize agreement.   PATIENT EDUCATION: Education details: see above Person educated: Patient and Spouse Education method: Explanation, Demonstration, Verbal cues, and Handouts Education comprehension: verbalized understanding, returned demonstration, verbal cues required, and needs further education  HOME EXERCISE PROGRAM: Sustained "ah," oral reading   GOALS: Goals reviewed with patient? Yes  SHORT TERM GOALS: Target date: 10/26/2022  Pt will participate in MBSS Baseline: Goal status: MET  2.  Pt will demonstrate use of swallow compensations with use of visual aid and occasional verbal cues during structured practice Baseline:  Goal status: Not met  3.  Pt will use dysarthria strategies,  resulting in intelligible production of 15/20 sentences  Baseline:  Goal status: Not met  4.  Pt will demonstrate dysphagia exercises with mod-A over 2 sessions  Baseline:  Goal status: Deferred - dysarthria HEP not performed at a therapeutic frequency   LONG TERM GOALS: Target date: 11/23/2022  Pt will complete dysphagia and dysarthria HEP 5/7 days over 1 week period Baseline:  Goal status: IN PROGRESS  2.  Pt and spouse will report carryover of swallow compensations and strategies at home Baseline:  Goal status: IN PROGRESS  3.  Pt will use dysarthria strategies during 10 minute conversation, resulting in 80% intelligibility  Baseline:  Goal status: IN PROGRESS  ASSESSMENT:  CLINICAL IMPRESSION: Patient is a 82 y.o. M who was seen today for moderate dysarthria, c/b low vocal intensity and imprecise articulation which results in significantly decreased intelligibility. Pt would benefit from 4 more sessions of cont'd skilled ST to address aforementioned deficits to reduce caregiver burden , enhance communication efficacy, and facilitate improved swallow function/safety . HEP is not being performed to therapeutic frequency. Pt met goal for MBSS, however did not meet any other goals. See today's note for more details. Pt will very likely be discharged after remaining 4 visits.  OBJECTIVE IMPAIRMENTS: Objective impairments include attention, memory, executive functioning, dysarthria, and dysphagia. These impairments are limiting patient from effectively communicating at home and in community and safety when swallowing.Factors affecting potential to achieve goals and functional outcome are ability to learn/carryover information and co-morbidities.. Patient will benefit from skilled SLP services to address above impairments and improve overall function.  REHAB POTENTIAL: Good  PLAN:  SLP FREQUENCY: 2x/week  SLP DURATION: 8 weeks  PLANNED INTERVENTIONS: Aspiration precaution training,  Pharyngeal  strengthening exercises, Diet toleration management , Environmental controls, Trials of upgraded texture/liquids, Cueing hierachy, Internal/external aids, Functional tasks, Multimodal communication approach, SLP instruction and feedback, Compensatory strategies, Patient/family education, and Re-evaluation    Tineka Uriegas, CCC-SLP 10/26/2022, 2:49 PM

## 2022-10-27 ENCOUNTER — Ambulatory Visit: Payer: PPO

## 2022-10-28 ENCOUNTER — Ambulatory Visit: Payer: PPO

## 2022-10-28 ENCOUNTER — Other Ambulatory Visit: Payer: Self-pay

## 2022-10-28 ENCOUNTER — Telehealth: Payer: Self-pay

## 2022-10-28 DIAGNOSIS — R2681 Unsteadiness on feet: Secondary | ICD-10-CM

## 2022-10-28 DIAGNOSIS — G20B2 Parkinson's disease with dyskinesia, with fluctuations: Secondary | ICD-10-CM

## 2022-10-28 DIAGNOSIS — R29818 Other symptoms and signs involving the nervous system: Secondary | ICD-10-CM | POA: Diagnosis not present

## 2022-10-28 DIAGNOSIS — G903 Multi-system degeneration of the autonomic nervous system: Secondary | ICD-10-CM

## 2022-10-28 DIAGNOSIS — R2689 Other abnormalities of gait and mobility: Secondary | ICD-10-CM

## 2022-10-28 DIAGNOSIS — Z9181 History of falling: Secondary | ICD-10-CM

## 2022-10-28 DIAGNOSIS — M6281 Muscle weakness (generalized): Secondary | ICD-10-CM

## 2022-10-28 MED ORDER — MIDODRINE HCL 10 MG PO TABS
10.0000 mg | ORAL_TABLET | Freq: Three times a day (TID) | ORAL | 0 refills | Status: DC
Start: 2022-10-28 — End: 2022-12-01

## 2022-10-28 NOTE — Therapy (Signed)
OUTPATIENT PHYSICAL THERAPY NEURO TREATMENT and Progress Note  Patient Name: Donald [name] MRN: [id number] DOB:[birth year], 82 y.o., male Today's Date: 10/28/2022   PCP: Gaspar Garbe, MD REFERRING PROVIDER: Vladimir Faster, DO  Progress Note Reporting Period 09/28/22 to 10/28/22  See note below for Objective Data and Assessment of Progress/Goals.      END OF SESSION:  PT End of Session - 10/28/22 1405     Visit Number 10    Number of Visits 12    Date for PT Re-Evaluation 11/09/22    Authorization Type [employer]    PT Start Time 1400    PT Stop Time 1445    PT Time Calculation (min) 45 min    Equipment Utilized During Treatment Gait belt    Activity Tolerance Patient tolerated treatment well    Behavior During Therapy Impulsive   improving with cues                 Past Medical History:  Diagnosis Date   Abnormality of gait 08/27/2014   Anxiety    Aortic stenosis    s/p AVR by Dr Laneta Simmers   Arthritis    left wrist- probable- not diagonised   Asthma    in the past   CAD (coronary artery disease)    s/p CABG 2006   GERD (gastroesophageal reflux disease)    H/O seasonal allergies    H/O: rheumatic fever    Hearing deficit    Bilateral hearing aids   Hematuria    had CT scan   Hiatal hernia    History of kidney stones    HOH (hard of hearing)    Hyperlipidemia    Hypertension    Hyperthyroidism    Inguinal hernia    right side; watching at this time per wife's report   Memory difficulty 08/27/2014   Pacemaker    Parkinson's disease    Persistent atrial fibrillation (HCC)    Scarlet fever    as a child   Shortness of breath    Sick sinus syndrome (HCC)    s/p PPM (MDT) 2006   Past Surgical History:  Procedure Laterality Date   AORTIC VALVE REPLACEMENT  2006   CARDIOVERSION N/A 09/14/2013   Procedure: CARDIOVERSION;  Surgeon: Thurmon Fair, MD;  Location: [facility] ENDOSCOPY;  Service: Cardiovascular;  Laterality: N/A;   CHOLECYSTECTOMY  N/A 07/21/2013   Procedure: LAPAROSCOPIC CHOLECYSTECTOMY;  Surgeon: Mariella Saa, MD;  Location: [facility] OR;  Service: General;  Laterality: N/A;   COLONOSCOPY W/ POLYPECTOMY     COLONOSCOPY WITH PROPOFOL N/A 08/31/2016   Procedure: COLONOSCOPY WITH PROPOFOL;  Surgeon: Charlott Rakes, MD;  Location: [facility] ENDOSCOPY;  Service: Endoscopy;  Laterality: N/A;   CORONARY ARTERY BYPASS GRAFT  2006   DENTAL SURGERY     implants   INSERT / REPLACE / REMOVE PACEMAKER  2006, 2012   MDT for sick sinus syndrome, gen change 8/12 by [facility]   KIDNEY STONE SURGERY  1984   Patient Active Problem List   Diagnosis Date Noted   HOH (hard of hearing) 12/18/2020   Personal history of colonic polyps 08/31/2016   Abnormality of gait 08/27/2014   Memory difficulty 08/27/2014   Permanent atrial fibrillation (HCC) 09/08/2013   Cholelithiases 07/19/2013   Cholelithiasis 07/19/2013   Encounter for therapeutic drug monitoring 07/03/2013   Mechanical aortic valve 04/05/2013   Parkinson disease 09/27/2012   Orthostatic hypotension 04/01/2012   Pacemaker-[employer] 03/29/2012   HEPATITIS A 06/04/2010  HYPERTHYROIDISM 06/04/2010   HYPERLIPIDEMIA 06/04/2010   Essential hypertension 06/04/2010   Coronary artery disease involving native coronary artery of native heart without angina pectoris 06/04/2010   SICK SINUS SYNDROME 06/04/2010   HIATAL HERNIA 06/04/2010   SHORTNESS OF BREATH 06/04/2010   CHEST PAIN 06/04/2010    ONSET DATE: 2014  REFERRING DIAG:  G20.B2 (ICD-10-CM) - Parkinson's disease with dyskinesia and fluctuating manifestations    THERAPY DIAG:  Other symptoms and signs involving the nervous system  Unsteadiness on feet  Muscle weakness (generalized)  Other abnormalities of gait and mobility  Parkinson's disease with dyskinesia and fluctuating manifestations  History of falling  Rationale for Evaluation and Treatment: Rehabilitation  SUBJECTIVE:                                                                                                                                                                                              SUBJECTIVE STATEMENT: Vital signs have been concerning.  Working with Dr. Arbutus Leas and cardiologist  Pt accompanied by: self-spouse in lobby  PERTINENT HISTORY: (from recent PT screen)  Timed Up and Go test:  29.82 sec with U-step (compared to 21.41 sec at d/c)  10 meter walk test: 24.5 sec = 1.34 ft/sec (compared to 2.25 ft/sec at d/c)  5 time sit to stand test: 12 sec with UE support (compared 19.63 sec)  PAIN:  Are you having pain? No  PRECAUTIONS: Fall, HOH  WEIGHT BEARING RESTRICTIONS: No  FALLS: Has patient fallen in last 6 months? Yes. Number of falls numerous/often multiple per day  LIVING ENVIRONMENT: Lives with: lives with their spouse Lives in: House/apartment Stairs:  reports ramp access in/out Has following equipment at home: Environmental consultant - 4 wheeled and U-step  PLOF: Requires assistive device for independence and Needs assistance with homemaking  PATIENT GOALS: Improve balance and walking  OBJECTIVE:    TODAY'S TREATMENT: 10/28/22 Activity Comments  Vitals: 116/82 mmHg, 59 bpm   NU-step speed intervals x 8 min 2 min warm-up, 30 sec fast 90-100 SPM and 30 sec slow large amplitude  Gait training -techniques to increase step length/stride -strategies to reduce freezing of gait and reduce festinating  5xSTS  71 sec w/ retro-LOB  TUG test Trial 1) 1 min 53 sec Trial 2) 1 min 43 sec  Transfer training -techniuqes to promote trunk flexion over BOS and using momentum      TODAY'S TREATMENT: 10/26/22 Activity Comments  Nustep L5 x 6 min UEs/LEs Maintaining 80-90 SPM; for neural priming   Sitting gastroc stretch 30" (with strap and elevated on stool) Sitting HS stretch 30" Sitting fig 4 stretch 30"  Verbal or manual assist for proper positioning    vitals 96% spo2, 84 bpm, 100/67 mmHg   multidirectional stepping to targets Some  difficulty pt needing to read PT's lips. Walking pole used d/t fear of falling   staggered ant/pos rocking Manual cues to slow down  lateral wt shifting Cues to lift heels to shift further  functional gait with U-step in between exercises Cues to "kick the R leg forward" to encourage TKE       PATIENT EDUCATION: Education details: spoke to wife and pt about pt's BP today and encouraged reaching out to Dr. Arbutus Leas about this as wife states she prescribed BP elevating meds Person educated: Patient and Spouse Education method: Explanation Education comprehension: verbalized understanding   Access Code: [email or web address] URL: [email or web address] Date: 10/07/2022 Prepared by: [facility] - Outpatient  Rehab - [facility]  Exercises - Seated Hamstring Stretch  - 2 x daily - 5 x weekly - 1 sets - 3 reps - 10 sec hold - Seated Ankle Dorsiflexion AROM  - 1-2 x daily - 7 x weekly - 2 sets - 5 reps - 3 sec hold    ----------------------------------------------------------------------------- Objective measures below taken at initial evaluation:  DIAGNOSTIC FINDINGS:   COGNITION: Overall cognitive status: No family/caregiver present to determine baseline cognitive functioning, HOH makes this determination more difficult   SENSATION: WFL  COORDINATION: Grossly impaired    MUSCLE TONE: ratcheting in BLE  MUSCLE LENGTH: Hamstrings: Right -20 deg; Left -20 deg     POSTURE: rounded shoulders and forward head  LOWER EXTREMITY ROM:     Active  Right Eval Left Eval  Hip flexion    Hip extension    Hip abduction    Hip adduction    Hip internal rotation    Hip external rotation    Knee flexion -10 -10  Knee extension    Ankle dorsiflexion 8 11  Ankle plantarflexion    Ankle inversion    Ankle eversion     (Blank rows = not tested)  LOWER EXTREMITY MMT:    Grossly 4/5 BLE  BED MOBILITY:  NT  TRANSFERS: Assistive device utilized: Environmental consultant - 4 wheeled  Sit to  stand: Modified independence and CGA Stand to sit: Modified independence and SBA Chair to chair: Modified independence and CGA Floor:  NT  RAMP:  NT  CURB:  Level of Assistance: CGA Assistive device utilized: Environmental consultant - 4 wheeled Curb Comments:   STAIRS: NT  GAIT: Gait pattern: shuffling and festinating Distance walked:  Assistive device utilized:  U-step Level of assistance: CGA and Min A Comments:   FUNCTIONAL TESTS:  5 times sit to stand: 33 sec w/ retro-LOB, pushing backs of knees to chair, multiple attempts needed Timed up and go (TUG): 23 sec. TUG w/ cog: 32.91 sec 10 meter walk test: 18.12 sec = 1.8 ft/sec  Mini-BESTest: 7/28      PATIENT EDUCATION: Education details: assessment findings Person educated: Patient Education method: Explanation Education comprehension: verbalized understanding  HOME EXERCISE PROGRAM: TBD  GOALS: Goals reviewed with patient? Yes  SHORT TERM GOALS: Target date: 10/19/2022    Patient will perform HEP with family/caregiver supervision for improved strength, balance, transfers, and gait  Baseline: Goal status: MET  2.  Manifest improved balance and BLE strength per 20 sec 5xSTS test without compensatory movements Baseline: 33 sec w/ compensations; (10/28/22) 71 sec Goal status: NOT MET    LONG TERM GOALS: Target date: 11/09/2022    Reduce risk for falls per  score 18/28 Mini-BESTest Baseline: 7/28;  Goal status: IN PROGRESS  2.  Demo improved efficiency and safety with ambulation maintaining speed of 2.25 ft/sec w/ least restrictive AD to improve community ambulation Baseline: 1.8 ft/sec Goal status: IN PROGRESS  3.  Demo improved safety with gait and negotiating turns per time of 15 sec TUG test Baseline: 23 sec w/ U-step; (10/28/22) 1 min 43 sec Goal status: IN PROGRESS    ASSESSMENT:  CLINICAL IMPRESSION: Initiated with interval training on NU-step to improve LE power and benefit of fast, alternating  coordination. 10th visit progress note addressed and attempted re-assessment of STG/LTG with patient demonstrating decline in performance since initial visit and notable increase in difficulty today with bradykinesia, difficulty in initiation, and difficulty with maintaining postural stability.  Performance of techniques for repetition to induce trunk flexion over BOS and improve carryover of momentum for sit to stand with improved performance by end of drill performing sit to stand w/ supervision vs CGA for tactile cue.  Unfortunately, no success with gait training to improve step length maintaining shuffled, festinating steps despite multi-modal cues.     OBJECTIVE IMPAIRMENTS: Abnormal gait, decreased activity tolerance, decreased balance, decreased coordination, decreased endurance, decreased mobility, difficulty walking, decreased ROM, decreased strength, impaired flexibility, impaired tone, improper body mechanics, and postural dysfunction.   ACTIVITY LIMITATIONS: carrying, lifting, bending, standing, transfers, reach over head, and locomotion level  PARTICIPATION LIMITATIONS: meal prep, cleaning, laundry, driving, shopping, community activity, and yard work  PERSONAL FACTORS: Age, Time since onset of injury/illness/exacerbation, and 3+ comorbidities: PMH  are also affecting patient's functional outcome.   REHAB POTENTIAL: Good  CLINICAL DECISION MAKING: Evolving/moderate complexity  EVALUATION COMPLEXITY: Moderate  PLAN:  PT FREQUENCY: 1-2x/week  PT DURATION: 6 weeks  PLANNED INTERVENTIONS: Therapeutic exercises, Therapeutic activity, Neuromuscular re-education, Balance training, Gait training, Patient/Family education, Self Care, Joint mobilization, Stair training, Vestibular training, Canalith repositioning, DME instructions, Aquatic Therapy, Dry Needling, Electrical stimulation, Spinal mobilization, Cryotherapy, Moist heat, Taping, Traction, Ultrasound, Ionotophoresis 4mg /ml  Dexamethasone, and Manual therapy  PLAN FOR NEXT SESSION: continue with seated PWR moves, seated exercises, standing march, kicks with cues for SLOWED motion; functional gait with U-step rollator-forward/back walking, turns   3:03 PM, 10/28/22 M. Shary Decamp, PT, DPT Physical Therapist- [place] Office Number: [phone]

## 2022-10-28 NOTE — Telephone Encounter (Signed)
Sent my chart message to patient about change in meds and sent in new RX removed old prescription for midodrine and asked patient about referral

## 2022-10-29 DIAGNOSIS — N390 Urinary tract infection, site not specified: Secondary | ICD-10-CM | POA: Diagnosis not present

## 2022-11-02 ENCOUNTER — Ambulatory Visit: Payer: PPO | Attending: Internal Medicine

## 2022-11-02 ENCOUNTER — Ambulatory Visit: Payer: PPO

## 2022-11-02 ENCOUNTER — Ambulatory Visit: Payer: PPO | Admitting: Occupational Therapy

## 2022-11-02 DIAGNOSIS — M6281 Muscle weakness (generalized): Secondary | ICD-10-CM

## 2022-11-02 DIAGNOSIS — R2689 Other abnormalities of gait and mobility: Secondary | ICD-10-CM

## 2022-11-02 DIAGNOSIS — R471 Dysarthria and anarthria: Secondary | ICD-10-CM

## 2022-11-02 DIAGNOSIS — Z5181 Encounter for therapeutic drug level monitoring: Secondary | ICD-10-CM | POA: Diagnosis not present

## 2022-11-02 DIAGNOSIS — R2681 Unsteadiness on feet: Secondary | ICD-10-CM

## 2022-11-02 DIAGNOSIS — R41841 Cognitive communication deficit: Secondary | ICD-10-CM

## 2022-11-02 DIAGNOSIS — I4821 Permanent atrial fibrillation: Secondary | ICD-10-CM

## 2022-11-02 DIAGNOSIS — R1312 Dysphagia, oropharyngeal phase: Secondary | ICD-10-CM

## 2022-11-02 DIAGNOSIS — R278 Other lack of coordination: Secondary | ICD-10-CM

## 2022-11-02 DIAGNOSIS — G20B2 Parkinson's disease with dyskinesia, with fluctuations: Secondary | ICD-10-CM

## 2022-11-02 DIAGNOSIS — Z9181 History of falling: Secondary | ICD-10-CM

## 2022-11-02 DIAGNOSIS — R29818 Other symptoms and signs involving the nervous system: Secondary | ICD-10-CM | POA: Diagnosis not present

## 2022-11-02 LAB — PROTIME-INR
INR: 8.7 (ref 0.9–1.2)
Prothrombin Time: 78.3 s — ABNORMAL HIGH (ref 9.1–12.0)

## 2022-11-02 NOTE — Therapy (Signed)
OUTPATIENT SPEECH LANGUAGE PATHOLOGY TREATMENT/PROGRESS NOTE   Patient Name: Donald [name] MRN: [id number] DOB:21-Jul-1940, 82 y.o., male Today's Date: 11/02/2022  PCP: Gaspar Garbe, MD REFERRING PROVIDER: Vladimir Faster, DO  END OF SESSION:  End of Session - 11/02/22 1455     Visit Number 10    Number of Visits 17    Date for SLP Re-Evaluation 11/23/22    SLP Start Time 1449    SLP Stop Time  1530    SLP Time Calculation (min) 41 min    Activity Tolerance Patient tolerated treatment well                  Past Medical History:  Diagnosis Date   Abnormality of gait 08/27/2014   Anxiety    Aortic stenosis    s/p AVR by Dr Laneta Simmers   Arthritis    left wrist- probable- not diagonised   Asthma    in the past   CAD (coronary artery disease)    s/p CABG 2006   GERD (gastroesophageal reflux disease)    H/O seasonal allergies    H/O: rheumatic fever    Hearing deficit    Bilateral hearing aids   Hematuria    had CT scan   Hiatal hernia    History of kidney stones    HOH (hard of hearing)    Hyperlipidemia    Hypertension    Hyperthyroidism    Inguinal hernia    right side; watching at this time per wife's report   Memory difficulty 08/27/2014   Pacemaker    Parkinson's disease    Persistent atrial fibrillation (HCC)    Scarlet fever    as a child   Shortness of breath    Sick sinus syndrome (HCC)    s/p PPM (MDT) 2006   Past Surgical History:  Procedure Laterality Date   AORTIC VALVE REPLACEMENT  2006   CARDIOVERSION N/A 09/14/2013   Procedure: CARDIOVERSION;  Surgeon: Thurmon Fair, MD;  Location: [facility] ENDOSCOPY;  Service: Cardiovascular;  Laterality: N/A;   CHOLECYSTECTOMY N/A 07/21/2013   Procedure: LAPAROSCOPIC CHOLECYSTECTOMY;  Surgeon: Mariella Saa, MD;  Location: [facility] OR;  Service: General;  Laterality: N/A;   COLONOSCOPY W/ POLYPECTOMY     COLONOSCOPY WITH PROPOFOL N/A 08/31/2016   Procedure: COLONOSCOPY WITH PROPOFOL;  Surgeon: Charlott Rakes, MD;  Location: [facility] ENDOSCOPY;  Service: Endoscopy;  Laterality: N/A;   CORONARY ARTERY BYPASS GRAFT  2006   DENTAL SURGERY     implants   INSERT / REPLACE / REMOVE PACEMAKER  2006, 2012   MDT for sick sinus syndrome, gen change 8/12 by [facility]   KIDNEY STONE SURGERY  1984   Patient Active Problem List   Diagnosis Date Noted   HOH (hard of hearing) 12/18/2020   Personal history of colonic polyps 08/31/2016   Abnormality of gait 08/27/2014   Memory difficulty 08/27/2014   Permanent atrial fibrillation (HCC) 09/08/2013   Cholelithiases 07/19/2013   Cholelithiasis 07/19/2013   Encounter for therapeutic drug monitoring 07/03/2013   Mechanical aortic valve 04/05/2013   Parkinson disease 09/27/2012   Orthostatic hypotension 04/01/2012   Pacemaker-[employer]tr[employer] 03/29/2012   HEPATITIS A 06/04/2010   HYPERTHYROIDISM 06/04/2010   HYPERLIPIDEMIA 06/04/2010   Essential hypertension 06/04/2010   Coronary artery disease involving native coronary artery of native heart without angina pectoris 06/04/2010   SICK SINUS SYNDROME 06/04/2010   HIATAL HERNIA 06/04/2010   SHORTNESS OF BREATH 06/04/2010   CHEST PAIN 06/04/2010   Speech  Therapy Progress Note  Dates of Reporting Period: 09/28/22 to present  Subjective Statement: PT has been seen for 10 sessions targeting speech and swallowing in light of PD. Pt also had MBS during this therapy course.  Objective: See "Imaging" for results of MBS (or look below).   Goal Update: Pt has not met most STGs, and is not on target to meet LTGs. He has essentially maxed rehab potential at this time due to limited carryover of target behavior with speech to home, and pt's resistance to assistance with swallowing precautions at home.   Plan: 2-3 more sessions primarily focusing on pt/family education re: swallowing safety, and what pt could do at home to maximize communication.  Reason Skilled Services are Required: Cont family education for maximizing pt's  safety and communication at home.    ONSET DATE: referred 09/01/22  REFERRING DIAG: G20.B2 (ICD-10-CM) - Parkinson's disease with dyskinesia and fluctuating manifestations   THERAPY DIAG:  Dysarthria and anarthria  Cognitive communication deficit  Dysphagia, oropharyngeal phase  Rationale for Evaluation and Treatment: Rehabilitation  SUBJECTIVE:   SUBJECTIVE STATEMENT: "She forgot it." (Pt, re: folder with homework tracker - after request for repeat from SLP). [name] confirmed pt did not perform HEP last week due to nausea Pt accompanied by: significant other    PERTINENT HISTORY: Dx wtih PD 2012. MBSS in July 2020 revealed moderate dysphagia with significant pharyngeal residue, silent aspiration, reduce tongue base, epiglittic inversion, pharyngeal contraction, hocking and drool. Prior ST course 2022 at [facility].   PAIN:  Are you having pain? No  FALLS: Has patient fallen in last 6 months?  See PT evaluation for details   PATIENT GOALS: swallow and talk better  OBJECTIVE:   DIAGNOSTIC FINDINGS:  MBSS 10/09/22 Clinical Impression: Pt exhibits mild oral and severe pharyngeal dysphagia which has mild-moderately progressed since MBS in 2022. Orally he was able to control and propel boluses with mild delays and lingual residue. He silently aspirated thin, nectar and puree consistencies due to decreased hyoid excursion, laryngeal elevation and laryngeal vestibular closure. Nectar thick was aspirated before laryngeal closure could be achieved. Cues for strong cough did not clear aspirates and only briefly cleared penetrates before barium reappeared on vocal cords.  Reduced tongue base retraction, decreased epiglottic deflection and pharyngeal stripping wave led to max vallecular and pyriform sinus residue with puree and nectar. Multiple swallows were ineffective to decrease residue however chin tuck did appear to minimally reduce residue. Chin tuck during the swallow was not effective  to protect airway nor were right or left head turns. Options were discussed with pt and wife. His wishes are to continue eating and drinking and she states pt has not had pneumonia. Continued education re: risk if pt were to experience an illness or injury that risk of pneumona and decompnesation increases. Pt to continue regular texture,thin liquids, multiple swallows with chin tuck, hard coughs after swallows, alternate liquids and solids, remain upright 1 hour after meals and crush pills. Factors that may increase risk of adverse event in presence of aspiration [name] [employer] [name] 2021): Factors that may increase risk of adverse event in presence of aspiration [name] & [name] 2021): Frail or deconditioned; Weak cough; Aspiration of thick, dense, and/or acidic materials; Frequent aspiration of large volumes Recommendations/Plan: Swallowing Evaluation Recommendations PO Diet Recommendation: Regular; Thin liquids (Level 0) Liquid Administration via: Cup Medication Administration: Crushed with puree Supervision: Patient able to self-feed; Full supervision/cueing for swallowing strategies Swallowing strategies  : Slow rate; Small bites/sips; Chin  tuck; Follow solids with liquids; Multiple dry swallows after each bite/sip; Hard cough after swallowing Postural changes: Stay upright 30-60 min after meals; Position pt fully upright for meals Oral care recommendations: Oral care BID (2x/day)  PATIENT REPORTED OUTCOME MEASURES (PROM): PROM was completed (CES) with a score of 13/32. Higher scores indicate greater effectiveness of communication.   TODAY'S TREATMENT:                                                                                                                                         DATE:  11/02/22: SPEECH: SLP and pt completed loud /a/ together simultaneously. SLP completed 10 reps with pt. SLP provided consistent cues for full breath. Reiterated to wife for pt to complete at least 5 reps BID  and wife shared pt did not complete any reps in last 7 days due to nausea. Of note, wife understood 6 of pt's 7 utterances when she was observing today, while SLP understood 3/7. SWALLOW: [name] stated pt, coughed at end of lunch, coughed up a bolus of greens (pt had greens for lunch). SLP postulated that pt was not following precaution of small bite, dry swallow/s after bolus clears oral cavity, and alternating bite/sip. Because of this, SLP used today's session as practice for pt to follow swallow precautions, which he did with max cues, consistently, using written cues. SLP encouraged [name] to use this technique at home and she stated that at home pt demonstrates high level of resistance to performing strategies with any assistance. SLP educated wife on controlling bite size by cutting up bites for pt and she stated "I ask him if he wants me to cut it up for him and he says, 'No.' " SLP educated pt and wife what occurs when pt swallows, based on results of MBS. SLP strongly discouraged dual-consistency foods and explained what these are for pt and wife. AT times SLP used written cues for pt when he demo'd he did not understand SLP due to pt's reduced hearing level. Pt cont without overt s/sx PNA. Pt/wife need cont education on overt s/sx aspiration PNA.  10/26/22: Pt was out of town Thurs-Saturday and has no checkmarks on his homework sheet since the mark SLP put during [name]'s last ST session on 10/21/22.  Pt benefited from simultaneous production of loud /a/ for first 4/5 reps. Reps very short in length despite SLP consistent cues for full breath and simultaneous production with SLP. Heckmann read his everyday sentences with consistent mod-max A for adequate breath support for each sentence - pt attempted to read off 2-3 sentences on one breath. In conversation with SLP pt benefited from usual max A for louder speech.   SLP wanted to bring [place] back today for remainder of session, in order to ask how  [place] was doing at home with swallow precautions and louder speech but she was not in lobby when SLP went  out at 1433. SLP asked [name] about joining in ST session next time for 10-15 minutes and she said she would be happy to do that.   10/21/22: SWALLOW: Given pt's lack of adherence to speech HEP SLP does not think pt will perform swallowing HEP with any frequency to foster increasing muscular strength for swallowing. SLP assisted pt when eating fig bar and drinking water. Pt req'd consistent cues for all precautions but small sips/bites. When SLP cued pt to cough after small bite fig bar he coughed the entire bite up. SLP told wife this and strongly urged her to attend pt while he is eating and provide him with cues for following precautions. Later in session when SLP charting, [place] took sip and SLP turned around to see pt taking consecutive sips from a straw and promptly coughed immediately afterwards. SLP used this example to reiterate to pt to use small sips. SPEECH: Loud /a/ produced today simultaneously with SLP for first 3 reps, and pt independent on last two productions, all with < 5 second length. SLP cued [place] to perform his sentences and req'd consistent mod A for breath support for each sentence - pt wanted to read off as many as possible on one breath. SLP worked with pt in functional context for speech - short 1-2 minute conversational segments. Pt talked about family with occasional min-mod A for "shout so I can hear you."  10/19/22: Pt, in OT session, was watched by SLP to see if he followed any aspiration precautions with water. He did not. SLP reminded him he needed to take small sips, swallow at least twice, and cough and reswallow last swallow. In simple 7 minute conversation about events yesterday pt req'd usual mod-max A for speech intelligibility. Frequency of cueing did not lessen cueing level needed by SLP.  Pt had three days where he completed HEP BID in the last week. With  /a/, pt benefited from simultaneous SLP production, as well as simultaneous production with SLP with cues for breath before each sentence with everyday sentences. SLP instructed pt to repeat sentences and reminded him he needs to shout with every response he provides. He told SLP [name] asks him to repeat almost everything he says - SLP correlated the fact he needs to shout with being more intelligible and [name] won't have to ask him to repeat as often if he shouts everything he says. He acknowledged understanding. The next 7 minutes SLP engaged pt in conversation and intelligibility improved to 70%, and 80% if SLP nonverbally cued pt he was not understood.   10/16/22: SPEECH: 8 minute simple conversation with pt - pt req'd usual mod A for speech intelligibility strategy usage -without pt using overarticulation and louder speech SLP understood 80%. SLP noted wife demonstrated understanding of pt 95% of the time. SWALLOW: Pt req'd initial consistent min-mod A for following precautions (except small bites/sips) with Dys III/thin, faded to usual min A for double swallows, cough, and alternate bite/sip.  10/14/22: SWALLOW: Pt with MBS 10/09/22. Results above. SLP strongly reiterated results of exam with pt/wife. SLP practiced recommendations with POs with pt, who required mod A usually for chin tuck, double swallows, and cough. Fading cues to min A occasionally for other precautions. Pt took small bites until end of session when SLP was not looking and then he took x2 bite size that he had been taking with SLP observing. SLP told pt to take smaller bites to mitigate risk of aspiration. SLP informed pt and wife that  wife may need to assist pt during meals. SLP asked [place] if this was ok and he agreed. Pt is not performing speech intelligibilty exercises as directed - SLP is cautious to recommend HEP for dysphagia, suspecting pt will also not complete as directed. SPEECH: He performed loud /a/ with consistent  simultaneous production and usual mod cues for full breath before production. Held loud /a/ for 5 seconds average. [name] got out pt's sentences for him and he req'd consistent max cues for taking breath for each one and producing loud speech for each of them. SLP looked at pt's practice sheet and he is performing once/day. SLP strongly stressed BID completion and told pt and wife rationale.   10/07/22: Pt told SLP MBS scheduled for Friday 10/09/22. Arrived with "wet" voice and took x3 attempts to clear. Once cleared, pt did not have to clear again for remainder of session. SLP provided max A for /a/ x10 - simultaneous production needed. SLP provided max A to slow down and read sentences using one breath per sentence, and shout sentences. SLP worked remainder of session with consistent mod cues to "shout" when pt speaks in sentence responses. When doing so, SLP understood [name] 75% of the time, however even with a cue to shout, [place] only used incr'd volume and articulation 70% of the time. When not "shouting", SLP understood [name] ~20% of the time.  Pt had reportedly completed exercises twice yesterday.   10/05/22: SLP cont to require to use very loud speech volume for pt to demonstrate understanding of SLP speech. Since last session pt has completed /a/ and sentences once/day, with two days BID. SLP told pt to complete every day BID.  Simultaneous production necessary for loud /a/ - otherwise pt with loudness decay after 2-3 seconds. SLP told [name]) she may need to complete loud /a/ with pt based upon today's production/s. SLP wonders the effectiveness of pt's practice thus far, if wife is not assisting pt. SLP cont to remind pt throughout session to "shout, so we can understand you" and that pt normal speech is mumbling in nature and this cannot be understood. Pt benefited from occasional cues to incr volume for the remaining time throughout the session (14 minutes) to "shout". [name] endorsed  it was easier to hear pt during session today than it is at home, and especially in the car. SLP told [name] to use "shout" as a cue for pt at home and also in the car, to see if this improves pt's loudness/clarity.  09/30/22: Pt wife waiting on a call back from scheduler for MBSS. SLP required to provide a very loud speaking volume for pt to respond, or ask SLP for repeat. [name] stated pt had hearing aids serviced "about a month ago." SLP may suggest return to hearing aid dispenser for service/checkup. Max cues needed for loud /a/ including modeling. Pt average loudness upper 80s dB with average 6 seconds, even with max cues for longer production. SLP told pt 10 reps, BID. SLP educated pt/wife that pt and wife should generate 10 everyday sentences to practice at home BID after the loud /a/ reps. When SLP inquired how consistent pt is with performing other HEPs in the past [name] stated, "He isn't too good with doing (the HEP) at home." SLP reiterated that [place] must do HEP at least 5 days/week for there to be any possibility of progress with speech intelligibility and/or with swallow strength.SLP provided pt/wife with HEP checklist for completion with "ahhhhhh" and "everyday sentences" slots,  to encourage HEP completion. At the end of session wife provided [place] with a sip of water and pt immediately coughed after an audible swallow.  09/28/22: Initiated training on dysarthria strategies: slow, loud, over-articulate, with practice at phrase reading and generative levels. Direct model required to optimize production and improve intelligibility. Education on POC and goals, to which pt and spouse verbalize agreement.   PATIENT EDUCATION: Education details: see above Person educated: Patient and Spouse Education method: Explanation, Demonstration, Verbal cues, and Handouts Education comprehension: verbalized understanding, returned demonstration, verbal cues required, and needs further  education  HOME EXERCISE PROGRAM: Sustained "ah," oral reading   GOALS: Goals reviewed with patient? Yes  SHORT TERM GOALS: Target date: 10/26/2022  Pt will participate in MBSS Baseline: Goal status: MET  2.  Pt will demonstrate use of swallow compensations with use of visual aid and occasional verbal cues during structured practice Baseline:  Goal status: Not met  3.  Pt will use dysarthria strategies, resulting in intelligible production of 15/20 sentences  Baseline:  Goal status: Not met  4.  Pt will demonstrate dysphagia exercises with mod-A over 2 sessions  Baseline:  Goal status: Deferred - dysarthria HEP not performed at a therapeutic frequency   LONG TERM GOALS: Target date: 11/23/2022  Pt will complete dysphagia and dysarthria HEP 5/7 days over 1 week period Baseline:  Goal status: IN PROGRESS  2.  Pt and spouse will report carryover of swallow compensations and strategies at home Baseline:  Goal status: IN PROGRESS  3.  Pt will use dysarthria strategies during 10 minute conversation, resulting in 80% intelligibility  Baseline:  Goal status: IN PROGRESS  ASSESSMENT:  CLINICAL IMPRESSION: Patient is a 81 y.o. M who was seen today for dysphagia, and for moderate dysarthria, c/b low vocal intensity and imprecise articulation which results in significantly decreased intelligibility. Pt would benefit from skilled ST to address aforementioned deficits to reduce caregiver burden , enhance communication efficacy, and facilitate improved swallow function/safety . HEP is not being performed to therapeutic frequency. Pt met goal for MBSS, however did not meet any other goals. See today's note for more details. Pt will very likely be discharged after remaining 2-3 visits due to [facility] rehab potential at this time. SLP will likely suggest pt's next ST course be with home health to maximize carryover to pt's environment.  OBJECTIVE IMPAIRMENTS: Objective impairments include  attention, memory, executive functioning, dysarthria, and dysphagia. These impairments are limiting patient from effectively communicating at home and in community and safety when swallowing.Factors affecting potential to achieve goals and functional outcome are ability to learn/carryover information and co-morbidities.. Patient will benefit from skilled SLP services to address above impairments and improve overall function.  REHAB POTENTIAL: Good  PLAN:  SLP FREQUENCY: 2x/week  SLP DURATION: 8 weeks  PLANNED INTERVENTIONS: Aspiration precaution training, Pharyngeal strengthening exercises, Diet toleration management , Environmental controls, Trials of upgraded texture/liquids, Cueing hierachy, Internal/external aids, Functional tasks, Multimodal communication approach, SLP instruction and feedback, Compensatory strategies, Patient/family education, and Re-evaluation    Laetitia Schnepf, CCC-SLP 11/02/2022, 2:55 PM

## 2022-11-02 NOTE — Therapy (Signed)
OUTPATIENT PHYSICAL THERAPY NEURO TREATMENT   Patient Name: Donald [name] MRN: [id number] DOB:1940-06-14, 82 y.o., male Today's Date: 11/02/2022   PCP: Gaspar Garbe, MD REFERRING PROVIDER: Vladimir Faster, DO  END OF SESSION:  PT End of Session - 11/02/22 1356     Visit Number 11    Number of Visits 12    Date for PT Re-Evaluation 11/09/22    Authorization Type [employer]    PT Start Time 1400    PT Stop Time 1445    PT Time Calculation (min) 45 min    Equipment Utilized During Treatment Gait belt    Activity Tolerance Patient tolerated treatment well    Behavior During Therapy Impulsive   improving with cues                 Past Medical History:  Diagnosis Date   Abnormality of gait 08/27/2014   Anxiety    Aortic stenosis    s/p AVR by Dr Laneta Simmers   Arthritis    left wrist- probable- not diagonised   Asthma    in the past   CAD (coronary artery disease)    s/p CABG 2006   GERD (gastroesophageal reflux disease)    H/O seasonal allergies    H/O: rheumatic fever    Hearing deficit    Bilateral hearing aids   Hematuria    had CT scan   Hiatal hernia    History of kidney stones    HOH (hard of hearing)    Hyperlipidemia    Hypertension    Hyperthyroidism    Inguinal hernia    right side; watching at this time per wife's report   Memory difficulty 08/27/2014   Pacemaker    Parkinson's disease    Persistent atrial fibrillation (HCC)    Scarlet fever    as a child   Shortness of breath    Sick sinus syndrome (HCC)    s/p PPM (MDT) 2006   Past Surgical History:  Procedure Laterality Date   AORTIC VALVE REPLACEMENT  2006   CARDIOVERSION N/A 09/14/2013   Procedure: CARDIOVERSION;  Surgeon: Thurmon Fair, MD;  Location: [facility] ENDOSCOPY;  Service: Cardiovascular;  Laterality: N/A;   CHOLECYSTECTOMY N/A 07/21/2013   Procedure: LAPAROSCOPIC CHOLECYSTECTOMY;  Surgeon: Mariella Saa, MD;  Location: [facility] OR;  Service: General;  Laterality: N/A;    COLONOSCOPY W/ POLYPECTOMY     COLONOSCOPY WITH PROPOFOL N/A 08/31/2016   Procedure: COLONOSCOPY WITH PROPOFOL;  Surgeon: Charlott Rakes, MD;  Location: [facility] ENDOSCOPY;  Service: Endoscopy;  Laterality: N/A;   CORONARY ARTERY BYPASS GRAFT  2006   DENTAL SURGERY     implants   INSERT / REPLACE / REMOVE PACEMAKER  2006, 2012   MDT for sick sinus syndrome, gen change 8/12 by [facility]   KIDNEY STONE SURGERY  1984   Patient Active Problem List   Diagnosis Date Noted   HOH (hard of hearing) 12/18/2020   Personal history of colonic polyps 08/31/2016   Abnormality of gait 08/27/2014   Memory difficulty 08/27/2014   Permanent atrial fibrillation (HCC) 09/08/2013   Cholelithiases 07/19/2013   Cholelithiasis 07/19/2013   Encounter for therapeutic drug monitoring 07/03/2013   Mechanical aortic valve 04/05/2013   Parkinson disease 09/27/2012   Orthostatic hypotension 04/01/2012   Pacemaker-[employer] 03/29/2012   HEPATITIS A 06/04/2010   HYPERTHYROIDISM 06/04/2010   HYPERLIPIDEMIA 06/04/2010   Essential hypertension 06/04/2010   Coronary artery disease involving native coronary artery of native heart without angina  pectoris 06/04/2010   SICK SINUS SYNDROME 06/04/2010   HIATAL HERNIA 06/04/2010   SHORTNESS OF BREATH 06/04/2010   CHEST PAIN 06/04/2010    ONSET DATE: 2014  REFERRING DIAG:  G20.B2 (ICD-10-CM) - Parkinson's disease with dyskinesia and fluctuating manifestations    THERAPY DIAG:  Unsteadiness on feet  Muscle weakness (generalized)  Other abnormalities of gait and mobility  Parkinson's disease with dyskinesia and fluctuating manifestations  History of falling  Rationale for Evaluation and Treatment: Rehabilitation  SUBJECTIVE:                                                                                                                                                                                             SUBJECTIVE STATEMENT: Vital signs have been concerning.   Working with Dr. Arbutus Leas and cardiologist  Pt accompanied by: self-spouse in lobby  PERTINENT HISTORY: (from recent PT screen)  Timed Up and Go test:  29.82 sec with U-step (compared to 21.41 sec at d/c)  10 meter walk test: 24.5 sec = 1.34 ft/sec (compared to 2.25 ft/sec at d/c)  5 time sit to stand test: 12 sec with UE support (compared 19.63 sec)  PAIN:  Are you having pain? No  PRECAUTIONS: Fall, HOH  WEIGHT BEARING RESTRICTIONS: No  FALLS: Has patient fallen in last 6 months? Yes. Number of falls numerous/often multiple per day  LIVING ENVIRONMENT: Lives with: lives with their spouse Lives in: House/apartment Stairs:  reports ramp access in/out Has following equipment at home: Environmental consultant - 4 wheeled and U-step  PLOF: Requires assistive device for independence and Needs assistance with homemaking  PATIENT GOALS: Improve balance and walking  OBJECTIVE:   TODAY'S TREATMENT: 11/02/22 Activity Comments  Pt education Discussion with patient and OT regarding POC details and collaboration for best level of function regarding mobility and ADL  Vitals: 106/69 mmHg, 51 bpm   NU-step level 5 x 8 min 2 min warm up then 30 sec 90-100 SPM; 60 sec 60-70 SPM. For NMR in fast, alternating coordination  Standing on incline 1x30 sec EO/EC  Sidestepping x 60 sec Large amplitude hip abd  Latreal shift and reach 1x10   Step-ups 1x10  8" box BUE support  LAQ 3x10 5#  Gait training W/ u-step and techniques to improve step length and reinforce plantigrade foot     TODAY'S TREATMENT: 10/28/22 Activity Comments  Vitals: 116/82 mmHg, 59 bpm   NU-step speed intervals x 8 min 2 min warm-up, 30 sec fast 90-100 SPM and 30 sec slow large amplitude  Gait training -techniques to increase step length/stride -strategies to reduce freezing of gait and reduce festinating  5xSTS  71 sec w/ retro-LOB  TUG test Trial 1) 1 min 53 sec Trial 2) 1 min 43 sec  Transfer training -techniuqes to promote trunk flexion  over BOS and using momentum         PATIENT EDUCATION: Education details: review of session details and tx rationale  Person educated: Patient and Spouse Education method: Explanation Education comprehension: verbalized understanding   Access Code: [email or web address] URL: [email or web address] Date: 10/07/2022 Prepared by: [facility] - Outpatient  Rehab - [facility]  Exercises - Seated Hamstring Stretch  - 2 x daily - 5 x weekly - 1 sets - 3 reps - 10 sec hold - Seated Ankle Dorsiflexion AROM  - 1-2 x daily - 7 x weekly - 2 sets - 5 reps - 3 sec hold    ----------------------------------------------------------------------------- Objective measures below taken at initial evaluation:  DIAGNOSTIC FINDINGS:   COGNITION: Overall cognitive status: No family/caregiver present to determine baseline cognitive functioning, HOH makes this determination more difficult   SENSATION: WFL  COORDINATION: Grossly impaired    MUSCLE TONE: ratcheting in BLE  MUSCLE LENGTH: Hamstrings: Right -20 deg; Left -20 deg     POSTURE: rounded shoulders and forward head  LOWER EXTREMITY ROM:     Active  Right Eval Left Eval  Hip flexion    Hip extension    Hip abduction    Hip adduction    Hip internal rotation    Hip external rotation    Knee flexion -10 -10  Knee extension    Ankle dorsiflexion 8 11  Ankle plantarflexion    Ankle inversion    Ankle eversion     (Blank rows = not tested)  LOWER EXTREMITY MMT:    Grossly 4/5 BLE  BED MOBILITY:  NT  TRANSFERS: Assistive device utilized: Environmental consultant - 4 wheeled  Sit to stand: Modified independence and CGA Stand to sit: Modified independence and SBA Chair to chair: Modified independence and CGA Floor:  NT  RAMP:  NT  CURB:  Level of Assistance: CGA Assistive device utilized: Environmental consultant - 4 wheeled Curb Comments:   STAIRS: NT  GAIT: Gait pattern: shuffling and festinating Distance walked:  Assistive  device utilized:  U-step Level of assistance: CGA and Min A Comments:   FUNCTIONAL TESTS:  5 times sit to stand: 33 sec w/ retro-LOB, pushing backs of knees to chair, multiple attempts needed Timed up and go (TUG): 23 sec. TUG w/ cog: 32.91 sec 10 meter walk test: 18.12 sec = 1.8 ft/sec  Mini-BESTest: 7/28      PATIENT EDUCATION: Education details: assessment findings Person educated: Patient Education method: Explanation Education comprehension: verbalized understanding  HOME EXERCISE PROGRAM: TBD  GOALS: Goals reviewed with patient? Yes  SHORT TERM GOALS: Target date: 10/19/2022    Patient will perform HEP with family/caregiver supervision for improved strength, balance, transfers, and gait  Baseline: Goal status: MET  2.  Manifest improved balance and BLE strength per 20 sec 5xSTS test without compensatory movements Baseline: 33 sec w/ compensations; (10/28/22) 71 sec Goal status: NOT MET    LONG TERM GOALS: Target date: 11/09/2022    Reduce risk for falls per score 18/28 Mini-BESTest Baseline: 7/28;  Goal status: IN PROGRESS  2.  Demo improved efficiency and safety with ambulation maintaining speed of 2.25 ft/sec w/ least restrictive AD to improve community ambulation Baseline: 1.8 ft/sec Goal status: IN PROGRESS  3.  Demo improved safety with gait and negotiating turns per time of 15 sec TUG test Baseline:  23 sec w/ U-step; (10/28/22) 1 min 43 sec Goal status: IN PROGRESS    ASSESSMENT:  CLINICAL IMPRESSION: Continued with activities to promote and reinforce standing balance and coordination with activities to emphasize large amplitude and safety with reaching outside BOS and improve step length and clearance to minimize festinating.  Improving in standing balance postural control maintaining upright position on incline and eyes closed conditions x 30 sec.  Limited carryover to improve stride/step length with continued tendency for shuffling/festinating worse  with turns and change of direction.  Multi-modal cues to no effect.   OBJECTIVE IMPAIRMENTS: Abnormal gait, decreased activity tolerance, decreased balance, decreased coordination, decreased endurance, decreased mobility, difficulty walking, decreased ROM, decreased strength, impaired flexibility, impaired tone, improper body mechanics, and postural dysfunction.   ACTIVITY LIMITATIONS: carrying, lifting, bending, standing, transfers, reach over head, and locomotion level  PARTICIPATION LIMITATIONS: meal prep, cleaning, laundry, driving, shopping, community activity, and yard work  PERSONAL FACTORS: Age, Time since onset of injury/illness/exacerbation, and 3+ comorbidities: PMH  are also affecting patient's functional outcome.   REHAB POTENTIAL: Good  CLINICAL DECISION MAKING: Evolving/moderate complexity  EVALUATION COMPLEXITY: Moderate  PLAN:  PT FREQUENCY: 1-2x/week  PT DURATION: 6 weeks  PLANNED INTERVENTIONS: Therapeutic exercises, Therapeutic activity, Neuromuscular re-education, Balance training, Gait training, Patient/Family education, Self Care, Joint mobilization, Stair training, Vestibular training, Canalith repositioning, DME instructions, Aquatic Therapy, Dry Needling, Electrical stimulation, Spinal mobilization, Cryotherapy, Moist heat, Taping, Traction, Ultrasound, Ionotophoresis 4mg /ml Dexamethasone, and Manual therapy  PLAN FOR NEXT SESSION: continue with seated PWR moves, seated exercises, standing march, kicks with cues for SLOWED motion; functional gait with U-step rollator-forward/back walking, turns   1:56 PM, 11/02/22 M. Shary Decamp, PT, DPT Physical Therapist- [place] Office Number: [phone]

## 2022-11-02 NOTE — Therapy (Signed)
OUTPATIENT OCCUPATIONAL THERAPY PARKINSON'S  Treatment Note  Patient Name: Donald [name] MRN: [id number] DOB:1941/03/28, 82 y.o., male Today's Date: 11/02/2022  PCP: Gaspar Garbe, MD REFERRING PROVIDER: Vladimir Faster, DO  END OF SESSION:  OT End of Session - 11/02/22 1319     Visit Number 9    Number of Visits 17    Date for OT Re-Evaluation 11/27/22    Authorization Type [employer]    OT Start Time 1318    OT Stop Time 1400    OT Time Calculation (min) 42 min    Activity Tolerance No increased pain;Patient tolerated treatment well    Behavior During Therapy [facility] for tasks assessed/performed;Impulsive               Past Medical History:  Diagnosis Date   Abnormality of gait 08/27/2014   Anxiety    Aortic stenosis    s/p AVR by Dr Laneta Simmers   Arthritis    left wrist- probable- not diagonised   Asthma    in the past   CAD (coronary artery disease)    s/p CABG 2006   GERD (gastroesophageal reflux disease)    H/O seasonal allergies    H/O: rheumatic fever    Hearing deficit    Bilateral hearing aids   Hematuria    had CT scan   Hiatal hernia    History of kidney stones    HOH (hard of hearing)    Hyperlipidemia    Hypertension    Hyperthyroidism    Inguinal hernia    right side; watching at this time per wife's report   Memory difficulty 08/27/2014   Pacemaker    Parkinson's disease    Persistent atrial fibrillation (HCC)    Scarlet fever    as a child   Shortness of breath    Sick sinus syndrome (HCC)    s/p PPM (MDT) 2006   Past Surgical History:  Procedure Laterality Date   AORTIC VALVE REPLACEMENT  2006   CARDIOVERSION N/A 09/14/2013   Procedure: CARDIOVERSION;  Surgeon: Thurmon Fair, MD;  Location: [facility] ENDOSCOPY;  Service: Cardiovascular;  Laterality: N/A;   CHOLECYSTECTOMY N/A 07/21/2013   Procedure: LAPAROSCOPIC CHOLECYSTECTOMY;  Surgeon: Mariella Saa, MD;  Location: [facility] OR;  Service: General;  Laterality: N/A;    COLONOSCOPY W/ POLYPECTOMY     COLONOSCOPY WITH PROPOFOL N/A 08/31/2016   Procedure: COLONOSCOPY WITH PROPOFOL;  Surgeon: Charlott Rakes, MD;  Location: [facility] ENDOSCOPY;  Service: Endoscopy;  Laterality: N/A;   CORONARY ARTERY BYPASS GRAFT  2006   DENTAL SURGERY     implants   INSERT / REPLACE / REMOVE PACEMAKER  2006, 2012   MDT for sick sinus syndrome, gen change 8/12 by [facility]   KIDNEY STONE SURGERY  1984   Patient Active Problem List   Diagnosis Date Noted   HOH (hard of hearing) 12/18/2020   Personal history of colonic polyps 08/31/2016   Abnormality of gait 08/27/2014   Memory difficulty 08/27/2014   Permanent atrial fibrillation (HCC) 09/08/2013   Cholelithiases 07/19/2013   Cholelithiasis 07/19/2013   Encounter for therapeutic drug monitoring 07/03/2013   Mechanical aortic valve 04/05/2013   Parkinson disease 09/27/2012   Orthostatic hypotension 04/01/2012   Pacemaker-[employer] 03/29/2012   HEPATITIS A 06/04/2010   HYPERTHYROIDISM 06/04/2010   HYPERLIPIDEMIA 06/04/2010   Essential hypertension 06/04/2010   Coronary artery disease involving native coronary artery of native heart without angina pectoris 06/04/2010   SICK SINUS SYNDROME 06/04/2010   HIATAL  HERNIA 06/04/2010   SHORTNESS OF BREATH 06/04/2010   CHEST PAIN 06/04/2010    ONSET DATE: referral date 09/01/22 (PD diagnosis 2014)  REFERRING DIAG: G20.B2 (ICD-10-CM) - Parkinson's disease with dyskinesia and fluctuating manifestations  THERAPY DIAG:  Other symptoms and signs involving the nervous system  Unsteadiness on feet  Muscle weakness (generalized)  Other lack of coordination  Rationale for Evaluation and Treatment: Rehabilitation  SUBJECTIVE:   SUBJECTIVE STATEMENT: "If I can do it on my own, I want to try. If I get hurt, I get a hurt".    Pt accompanied by: self & spouse [name] in waiting area   PERTINENT HISTORY: Parkinson's Disease. PMH: hx of falls (wears helment), arthritis, asthma, CAD,  GERD, HOH, HLD, HTN, hyperthyroidism, anxiety, aortic stenosis, hx of aortic valve replacement 2006, memory difficulty, pacemaker, persistent a-fib, hx of scarlet fever, shortness of breath, hx of CABG 2006, cholecystectomy 2015, neuropathy  PRECAUTIONS: Fall; WEIGHT BEARING RESTRICTIONS: No  PAIN:  Are you having pain? None today reported  FALLS: Has patient fallen in last 6 months? Yes. Number of falls fall frequently, reports falling 3x today  LIVING ENVIRONMENT: Lives with: lives with their spouse Lives in: House/apartment Stairs:  ramped entrance Has following equipment at home: Grab bars, Ramped entry, and U-step walker  PLOF: Independent with basic ADLs and Requires assistive device for independence  PATIENT GOALS: to work on his balance   OBJECTIVE:  (All objective assessments below are from initial evaluation on: 09/28/22 unless otherwise specified.)   HAND DOMINANCE: Left  ADLs: Overall ADLs: Pt reports it takes about 30 mins to get dressed, but nobody helps him with that.  Reports that he does not need any help with any bathroom transfers.  He utilizes a hook on pants button instead of buttoning and wears step in shoes to eliminate tying shoes. Transfers/ambulation related to ADLs: Utilizes U-step walker  Equipment: Grab bars and Walk in shower  IADLs: Light housekeeping: changes bed linen Medication management: wife administers Handwriting: 75% legible, shaky  MOBILITY STATUS: Hx of falls and ambulates with U-step walker and falls frequently  POSTURE COMMENTS:  rounded shoulders and forward head  FUNCTIONAL OUTCOME MEASURES: Fastening/unfastening 3 buttons: 5:16.46 (Pt utilizing all fingers intermittently to attempt to fasten buttons) Physical performance test: PPT#2 (simulated eating) 22.93 sec & PPT#4 (donning/doffing jacket): 2:41.1 (attempted for 45 seconds with inability to thread 2nd arm, therefore switched arms)  COORDINATION: 9 Hole Peg test: Right:  2.43.9; Left: 3:47.9 (pt dropping multiple pegs and benefiting from picking up from table top by sliding off edge of table)  10/16/22: 9 hole peg test: R: 4:58 (pt dropping multiple pegs and transferring them from finger tips to palm and then attempting to place with gross grasp); L: 6:07  09/30/22: Box and blocks: L: 28 blocks, R: 37 blocks 10/19/22: box and blocks: L: 23 blocks, then 28 blocks and R: 43 blocks  UE ROM:   R shoulder flexion 110*, L shoulder flexion 110*  SENSATION: "Feels like ice"  COGNITION: Overall cognitive status: No family/caregiver present to determine baseline cognitive functioning and HOH makes this determination more difficult  OBSERVATIONS:    10/14/22: today he presents less impulsive than last session- more sedate, quite and slow. More typical of PD.  He is still toe-walking.    TODAY'S TREATMENT:  11/02/22 Lengthy discussion about pt's limitations.  Pt reports wanting to drive his tractor, get a closer shave, and wife assisting and/or hovering when pt stands due to frequency of falling and recent BP issues.  OT reiterated impact of low BP on balance and endurance as well as frequency of falls impacting need for supervision/assistance from wife.  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  OT reiterated impact of medications, BP, elevated INR, on bleeding risk if/when pt falls.  Pt also expressing frustration with needing to use electric razor which does not allow for close shave.  Discussed option of having a professional shave him and/or looking for a new Neurosurgeon. Coordination: attempting to complete 9 hole peg test initially with LUE, however after ~90 seconds pt requested to complete with RUE.  Pt then utilizing a combination  of hands to complete primarily with RUE.  OT attempted to stop pt however he persisted, ultimately requiring 9:22 to place pegs.  Sensation greatly impacting ease with [facility] tasks.    10/21/22 Large amplitude: utilizing Resistance Clothespins 6# with LUE for low and high functional reaching in standing at high table.  Focus placed on functional reaching with LUE with focus on large amplitude movements of feet apart, incorporating weight shift and trunk rotation, and opening hand.  Pt with difficulty with timing of grasp and needing assistance of table top for improved grasp with LUE.  Self-feeding: Engaged in scooping dried beans with and without built up foam handle, no difference noted therefore removed foam handle.  Pt completed PPT#2 in 11.79 sec with significant improvement from evaluation.  Coordination: Placing various sized pegs in arc pegboard with each UE with min-mod cueing for shoulder compensation and for tip pinch (particularly with L hand) as pt with tendency to utilize cylindrical grasp with LUE.  Pt with improved in-hand manipulation, but increased difficulty with smallest pegs. OT placing wash cloth under pegs to add friction to increase success with picking up smallest pegs. Ambulation: pt demonstrating toe walking and shuffling gait into treatment space, however after session pt with initial toe walking but with cues for steady gait pt demonstrating improved step length and able to carry out to entrance of clinic without additional cues.    10/19/22 Large amplitude: engaged in picking up and transferring rings from cone to cone with focus on elbow extension and large hand opening when grasping rings.  Cones placed to facilitate increased elbow extension and reaching across midline and outside BOS.  Completed in sitting progressing to standing.  In standing with feet apart and staggered stance for improved BOS and decrease need of stepping with trunk rotation.  OT providing CGA throughout  dynamic standing task. Coordination: Engaged in translation from palm to finger tips with Connect 4 piece.  Pt able to translate from finger tips to palm, but unable to translate back to finger tips without turning over palm and using momentum and gravity.  Transitioned back to marker with pt still demonstrating difficulty with translating palm to finger tips as well.  OT providing demonstration and min-mod cues to facilitate large opening of hand with manipulation of marker and Connect 4 piece.  OT demonstrating rotation with twirling large pen both directions.  Pt with significant difficulty with rotation, however demonstrating near ability to complete on R compared to unable to sequence/complete on L.   Box and blocks: L: 23 blocks, then 28 blocks  and R: 43 blocks    PATIENT EDUCATION: Education details: ongoing condition specific education Person educated: Patient and Spouse Education method: Explanation Education comprehension: verbalized understanding and needs further education  HOME EXERCISE PROGRAM: Access Code: [email or web address] URL: [email or web address] Date: 10/07/2022 Prepared by: Fannie Knee   GOALS: Goals reviewed with patient? Yes  SHORT TERM GOALS: Target date: 10/30/22  Pt will perform PD-specific HEP with cueing prn from caregiver  Baseline: Goal status: IN PROGRESS  2.  Pt will improve R hand coordination for ADLs (buttoning/tying shoes) as shown by completing 9-hole peg test in 2 min or less  Baseline: Right: 2.43.9; Left: 3:47.9  Goal status: IN PROGRESS  3.  Pt will improve L hand coordination for ADLs (buttoning/tying shoes) as shown by completing 9-hole peg test in 3 mins or less  Baseline: Right: 2.43.9; Left: 3:47.9  Goal status: IN PROGRESS  4.  Pt/caregiver will verbalize understanding of ways to decr risk of future complcations related to PD (including falls).  Baseline:  Goal status: IN PROGRESS   LONG TERM GOALS: Target date:  11/27/22  Pt/caregiver will verbalize understanding of AE/strategies to increase ease, safety, and independence with ADLs/IADLs  Baseline:  Goal status: IN PROGRESS  2.  Pt will improve functional reaching/coordination for ADLs as shown by improving score on box and blocks test by at least 5 with dominant LUE  Baseline:  L: 28 blocks, R: 37 blocks Goal status: IN PROGRESS  3.  Pt will complete 3 button/unbutton in 4 mins or less with use of AE PRN to demonstrate increased coordination and motor control to manage clothing fasteners. Baseline:  5:16.46  Goal status: IN PROGRESS  4.  Pt will demonstrate improved ease with feeding as evidenced by decreasing PPT#2 (self feeding) by 3 secs Baseline: PPT#2 (simulated eating) 22.93 sec  Goal status: IN PROGRESS  5.  Pt will demonstrate ability to retrieve a lightweight object at moderate range to increase shoulder flexion. Baseline:  Goal status: IN PROGRESS  ASSESSMENT:  CLINICAL IMPRESSION: Pt verbalizing frustration with continued falls, limitations in independence in home, and co-pay amount of therapy sessions.  OT attempted to facilitate a conversation to prioritize certain goals and/or therapies to allow pt to focus on what is most important to him.  OT offered to decrease frequency of OT vs discharge as desired, with pt stating that he will complete currently scheduled appointments but not clarifying remaining goals to focus on.    PLAN:  OT FREQUENCY: 2x/week  OT DURATION: 8 weeks  PLANNED INTERVENTIONS: self care/ADL training, therapeutic exercise, therapeutic activity, neuromuscular re-education, passive range of motion, balance training, functional mobility training, ultrasound, compression bandaging, moist heat, cryotherapy, patient/family education, cognitive remediation/compensation, psychosocial skills training, energy conservation, coping strategies training, and DME and/or AE instructions  CONSULTED AND AGREED WITH PLAN OF  CARE: Patient and family member/caregiver  PLAN FOR NEXT SESSION: Focus on cognitive/safety tasks, as well as FMS and also standing balance/coordination activities.    Philippe Gang, OTR/L 11/02/2022, 1:20 PM

## 2022-11-02 NOTE — Patient Instructions (Addendum)
Description   STAT LAB 8.7: Called and spoke with pt's wife. Instructed for pt to eat greens, HOLD Warfarin today, tomorrow, Wednesday, and Thursday.  Recheck INR on Friday, 11/06/22 Seek immediate medical attention if signs or symptoms of bleeding occur.  Normal dose: 1 tablet daily except 1/2 tablet on Sundays, Tuesdays, and Thursdays.  Call [phone] call with any new medications or if scheduled for any other procedures.  Clearance/Procedure Fax #[phone]

## 2022-11-04 ENCOUNTER — Ambulatory Visit: Payer: PPO | Admitting: Podiatry

## 2022-11-05 ENCOUNTER — Ambulatory Visit: Payer: PPO | Admitting: Podiatry

## 2022-11-06 ENCOUNTER — Ambulatory Visit: Payer: PPO | Admitting: Occupational Therapy

## 2022-11-06 ENCOUNTER — Ambulatory Visit: Payer: PPO

## 2022-11-06 ENCOUNTER — Ambulatory Visit: Payer: PPO | Attending: Cardiovascular Disease

## 2022-11-06 DIAGNOSIS — M6281 Muscle weakness (generalized): Secondary | ICD-10-CM

## 2022-11-06 DIAGNOSIS — R1312 Dysphagia, oropharyngeal phase: Secondary | ICD-10-CM

## 2022-11-06 DIAGNOSIS — Z5181 Encounter for therapeutic drug level monitoring: Secondary | ICD-10-CM | POA: Diagnosis not present

## 2022-11-06 DIAGNOSIS — R2681 Unsteadiness on feet: Secondary | ICD-10-CM

## 2022-11-06 DIAGNOSIS — G20B2 Parkinson's disease with dyskinesia, with fluctuations: Secondary | ICD-10-CM

## 2022-11-06 DIAGNOSIS — R278 Other lack of coordination: Secondary | ICD-10-CM

## 2022-11-06 DIAGNOSIS — R29818 Other symptoms and signs involving the nervous system: Secondary | ICD-10-CM | POA: Diagnosis not present

## 2022-11-06 DIAGNOSIS — R471 Dysarthria and anarthria: Secondary | ICD-10-CM

## 2022-11-06 DIAGNOSIS — R2689 Other abnormalities of gait and mobility: Secondary | ICD-10-CM

## 2022-11-06 DIAGNOSIS — Z9181 History of falling: Secondary | ICD-10-CM

## 2022-11-06 DIAGNOSIS — R41841 Cognitive communication deficit: Secondary | ICD-10-CM

## 2022-11-06 LAB — POCT INR: INR: 1.8 — AB (ref 2.0–3.0)

## 2022-11-06 NOTE — Therapy (Signed)
OUTPATIENT PHYSICAL THERAPY NEURO TREATMENT and D/C Summary  Patient Name: Donald [name] MRN: [id number] DOB:[birth year], 82 y.o., male Today's Date: 11/06/2022   PCP: Gaspar Garbe, MD REFERRING PROVIDER: Tat, Octaviano Batty, DO  PHYSICAL THERAPY DISCHARGE SUMMARY  Visits from Start of Care: 12  Current functional level related to goals / functional outcomes: Goals not met   Remaining deficits: Remaining balance, mobility, and fall risk deficits   Education / Equipment: HEP   Patient agrees to discharge. Patient goals were not met. Patient is being discharged due to did not respond to therapy.  Pt also having medical concerns  END OF SESSION:  PT End of Session - 11/06/22 1015     Visit Number 12    Number of Visits 12    Date for PT Re-Evaluation 11/09/22    Authorization Type [employer]    PT Start Time 1015    PT Stop Time 1100    PT Time Calculation (min) 45 min    Equipment Utilized During Treatment Gait belt    Activity Tolerance Patient tolerated treatment well    Behavior During Therapy Impulsive   improving with cues                 Past Medical History:  Diagnosis Date   Abnormality of gait 08/27/2014   Anxiety    Aortic stenosis    s/p AVR by Dr Laneta Simmers   Arthritis    left wrist- probable- not diagonised   Asthma    in the past   CAD (coronary artery disease)    s/p CABG 2006   GERD (gastroesophageal reflux disease)    H/O seasonal allergies    H/O: rheumatic fever    Hearing deficit    Bilateral hearing aids   Hematuria    had CT scan   Hiatal hernia    History of kidney stones    HOH (hard of hearing)    Hyperlipidemia    Hypertension    Hyperthyroidism    Inguinal hernia    right side; watching at this time per wife's report   Memory difficulty 08/27/2014   Pacemaker    Parkinson's disease    Persistent atrial fibrillation (HCC)    Scarlet fever    as a child   Shortness of breath    Sick sinus syndrome (HCC)     s/p PPM (MDT) 2006   Past Surgical History:  Procedure Laterality Date   AORTIC VALVE REPLACEMENT  2006   CARDIOVERSION N/A 09/14/2013   Procedure: CARDIOVERSION;  Surgeon: Thurmon Fair, MD;  Location: [facility] ENDOSCOPY;  Service: Cardiovascular;  Laterality: N/A;   CHOLECYSTECTOMY N/A 07/21/2013   Procedure: LAPAROSCOPIC CHOLECYSTECTOMY;  Surgeon: Mariella Saa, MD;  Location: [facility] OR;  Service: General;  Laterality: N/A;   COLONOSCOPY W/ POLYPECTOMY     COLONOSCOPY WITH PROPOFOL N/A 08/31/2016   Procedure: COLONOSCOPY WITH PROPOFOL;  Surgeon: Charlott Rakes, MD;  Location: [facility] ENDOSCOPY;  Service: Endoscopy;  Laterality: N/A;   CORONARY ARTERY BYPASS GRAFT  2006   DENTAL SURGERY     implants   INSERT / REPLACE / REMOVE PACEMAKER  2006, 2012   MDT for sick sinus syndrome, gen change 8/12 by [facility]   KIDNEY STONE SURGERY  1984   Patient Active Problem List   Diagnosis Date Noted   HOH (hard of hearing) 12/18/2020   Personal history of colonic polyps 08/31/2016   Abnormality of gait 08/27/2014   Memory difficulty 08/27/2014   Permanent  atrial fibrillation (HCC) 09/08/2013   Cholelithiases 07/19/2013   Cholelithiasis 07/19/2013   Encounter for therapeutic drug monitoring 07/03/2013   Mechanical aortic valve 04/05/2013   Parkinson disease 09/27/2012   Orthostatic hypotension 04/01/2012   Pacemaker-[employer] 03/29/2012   HEPATITIS A 06/04/2010   HYPERTHYROIDISM 06/04/2010   HYPERLIPIDEMIA 06/04/2010   Essential hypertension 06/04/2010   Coronary artery disease involving native coronary artery of native heart without angina pectoris 06/04/2010   SICK SINUS SYNDROME 06/04/2010   HIATAL HERNIA 06/04/2010   SHORTNESS OF BREATH 06/04/2010   CHEST PAIN 06/04/2010    ONSET DATE: 2014  REFERRING DIAG:  G20.B2 (ICD-10-CM) - Parkinson's disease with dyskinesia and fluctuating manifestations    THERAPY DIAG:  Other symptoms and signs involving the nervous system  Unsteadiness on  feet  Muscle weakness (generalized)  Other abnormalities of gait and mobility  Parkinson's disease with dyskinesia and fluctuating manifestations  History of falling  Rationale for Evaluation and Treatment: Rehabilitation  SUBJECTIVE:                                                                                                                                                                                             SUBJECTIVE STATEMENT: Vital signs have been concerning.  Working with Dr. Arbutus Leas and cardiologist. Getting PT/INR checked today.  Pt accompanied by: self-spouse in lobby  PERTINENT HISTORY: (from recent PT screen)  Timed Up and Go test:  29.82 sec with U-step (compared to 21.41 sec at d/c)  10 meter walk test: 24.5 sec = 1.34 ft/sec (compared to 2.25 ft/sec at d/c)  5 time sit to stand test: 12 sec with UE support (compared 19.63 sec)  PAIN:  Are you having pain? No  PRECAUTIONS: Fall, HOH  WEIGHT BEARING RESTRICTIONS: No  FALLS: Has patient fallen in last 6 months? Yes. Number of falls numerous/often multiple per day  LIVING ENVIRONMENT: Lives with: lives with their spouse Lives in: House/apartment Stairs:  reports ramp access in/out Has following equipment at home: Environmental consultant - 4 wheeled and U-step  PLOF: Requires assistive device for independence and Needs assistance with homemaking  PATIENT GOALS: Improve balance and walking  OBJECTIVE:    TODAY'S TREATMENT: 11/06/22 Activity Comments  Vitals: 97/65 mmHg, 56 bpm   Mini-BESTest 9/28  STG/LTGs See results                     PATIENT EDUCATION: Education details: review of session details and tx rationale  Person educated: Patient and Spouse Education method: Explanation Education comprehension: verbalized understanding   Access Code: [email or web address] URL: [email or web address] Date: 10/07/2022 Prepared by: [facility] - Outpatient  Rehab - [facility]  Exercises - Seated  Hamstring Stretch  - 2 x daily - 5 x weekly - 1 sets - 3 reps - 10 sec hold - Seated Ankle Dorsiflexion AROM  - 1-2 x daily - 7 x weekly - 2 sets - 5 reps - 3 sec hold    ----------------------------------------------------------------------------- Objective measures below taken at initial evaluation:  DIAGNOSTIC FINDINGS:   COGNITION: Overall cognitive status: No family/caregiver present to determine baseline cognitive functioning, HOH makes this determination more difficult   SENSATION: WFL  COORDINATION: Grossly impaired    MUSCLE TONE: ratcheting in BLE  MUSCLE LENGTH: Hamstrings: Right -20 deg; Left -20 deg     POSTURE: rounded shoulders and forward head  LOWER EXTREMITY ROM:     Active  Right Eval Left Eval  Hip flexion    Hip extension    Hip abduction    Hip adduction    Hip internal rotation    Hip external rotation    Knee flexion -10 -10  Knee extension    Ankle dorsiflexion 8 11  Ankle plantarflexion    Ankle inversion    Ankle eversion     (Blank rows = not tested)  LOWER EXTREMITY MMT:    Grossly 4/5 BLE  BED MOBILITY:  NT  TRANSFERS: Assistive device utilized: Environmental consultant - 4 wheeled  Sit to stand: Modified independence and CGA Stand to sit: Modified independence and SBA Chair to chair: Modified independence and CGA Floor:  NT  RAMP:  NT  CURB:  Level of Assistance: CGA Assistive device utilized: Environmental consultant - 4 wheeled Curb Comments:   STAIRS: NT  GAIT: Gait pattern: shuffling and festinating Distance walked:  Assistive device utilized:  U-step Level of assistance: CGA and Min A Comments:   FUNCTIONAL TESTS:  5 times sit to stand: 33 sec w/ retro-LOB, pushing backs of knees to chair, multiple attempts needed Timed up and go (TUG): 23 sec. TUG w/ cog: 32.91 sec 10 meter walk test: 18.12 sec = 1.8 ft/sec  Mini-BESTest: 7/28      PATIENT EDUCATION: Education details: assessment findings Person educated: Patient Education  method: Explanation Education comprehension: verbalized understanding  HOME EXERCISE PROGRAM: TBD  GOALS: Goals reviewed with patient? Yes  SHORT TERM GOALS: Target date: 10/19/2022    Patient will perform HEP with family/caregiver supervision for improved strength, balance, transfers, and gait  Baseline: Goal status: MET  2.  Manifest improved balance and BLE strength per 20 sec 5xSTS test without compensatory movements Baseline: 33 sec w/ compensations; (10/28/22) 71 sec Goal status: NOT MET    LONG TERM GOALS: Target date: 11/09/2022    Reduce risk for falls per score 18/28 Mini-BESTest Baseline: 7/28; (11/06/22) 9/28 Goal status: NOT MET  2.  Demo improved efficiency and safety with ambulation maintaining speed of 2.25 ft/sec w/ least restrictive AD to improve community ambulation Baseline: 1.8 ft/sec; 1.47 ft/sec Goal status: NOT MET  3.  Demo improved safety with gait and negotiating turns per time of 15 sec TUG test Baseline: 23 sec w/ U-step; (10/28/22) 1 min 43 sec; (11/06/22) 34 sec w/ U-step Goal status: NOT MET    ASSESSMENT:  CLINICAL IMPRESSION: Review of POC details and outcome measures without significant change in status.  Patient's symptoms seem to fluctuate depending on time of day and also interaction of underlying conditions.  Patient has bene experiencing orthostatic hypotension issues as well as lately an issue with PT/INR.  No significant change on outcome measures and  variable performance.  Patient will be ending therapy at this time to pursue rectifying medical issues and will f/u with PT as indicated thereafter   OBJECTIVE IMPAIRMENTS: Abnormal gait, decreased activity tolerance, decreased balance, decreased coordination, decreased endurance, decreased mobility, difficulty walking, decreased ROM, decreased strength, impaired flexibility, impaired tone, improper body mechanics, and postural dysfunction.   ACTIVITY LIMITATIONS: carrying, lifting,  bending, standing, transfers, reach over head, and locomotion level  PARTICIPATION LIMITATIONS: meal prep, cleaning, laundry, driving, shopping, community activity, and yard work  PERSONAL FACTORS: Age, Time since onset of injury/illness/exacerbation, and 3+ comorbidities: PMH  are also affecting patient's functional outcome.   REHAB POTENTIAL: Good  CLINICAL DECISION MAKING: Evolving/moderate complexity  EVALUATION COMPLEXITY: Moderate  PLAN:  PT FREQUENCY: 1-2x/week  PT DURATION: 6 weeks  PLANNED INTERVENTIONS: Therapeutic exercises, Therapeutic activity, Neuromuscular re-education, Balance training, Gait training, Patient/Family education, Self Care, Joint mobilization, Stair training, Vestibular training, Canalith repositioning, DME instructions, Aquatic Therapy, Dry Needling, Electrical stimulation, Spinal mobilization, Cryotherapy, Moist heat, Taping, Traction, Ultrasound, Ionotophoresis 4mg /ml Dexamethasone, and Manual therapy  PLAN FOR NEXT SESSION: D/C to medical care at this time   10:16 AM, 11/06/22 M. Shary Decamp, PT, DPT Physical Therapist- [place] Office Number: [phone]

## 2022-11-06 NOTE — Therapy (Signed)
OUTPATIENT SPEECH LANGUAGE PATHOLOGY TREATMENT/DISCHARGE   Patient Name: Donald [name] MRN: [id number] DOB:Sep 24, 1940, 82 y.o., male Today's Date: 11/06/2022  PCP: Gaspar Garbe, MD REFERRING PROVIDER: Vladimir Faster, DO  END OF SESSION:  End of Session - 11/06/22 1129     Visit Number 11    Number of Visits 17    Date for SLP Re-Evaluation 11/23/22    SLP Start Time 1106    SLP Stop Time  1145    SLP Time Calculation (min) 39 min    Activity Tolerance Patient tolerated treatment well                  Past Medical History:  Diagnosis Date   Abnormality of gait 08/27/2014   Anxiety    Aortic stenosis    s/p AVR by Dr Laneta Simmers   Arthritis    left wrist- probable- not diagonised   Asthma    in the past   CAD (coronary artery disease)    s/p CABG 2006   GERD (gastroesophageal reflux disease)    H/O seasonal allergies    H/O: rheumatic fever    Hearing deficit    Bilateral hearing aids   Hematuria    had CT scan   Hiatal hernia    History of kidney stones    HOH (hard of hearing)    Hyperlipidemia    Hypertension    Hyperthyroidism    Inguinal hernia    right side; watching at this time per wife's report   Memory difficulty 08/27/2014   Pacemaker    Parkinson's disease    Persistent atrial fibrillation (HCC)    Scarlet fever    as a child   Shortness of breath    Sick sinus syndrome (HCC)    s/p PPM (MDT) 2006   Past Surgical History:  Procedure Laterality Date   AORTIC VALVE REPLACEMENT  2006   CARDIOVERSION N/A 09/14/2013   Procedure: CARDIOVERSION;  Surgeon: Thurmon Fair, MD;  Location: [facility] ENDOSCOPY;  Service: Cardiovascular;  Laterality: N/A;   CHOLECYSTECTOMY N/A 07/21/2013   Procedure: LAPAROSCOPIC CHOLECYSTECTOMY;  Surgeon: Mariella Saa, MD;  Location: [facility] OR;  Service: General;  Laterality: N/A;   COLONOSCOPY W/ POLYPECTOMY     COLONOSCOPY WITH PROPOFOL N/A 08/31/2016   Procedure: COLONOSCOPY WITH PROPOFOL;  Surgeon: Charlott Rakes, MD;  Location: [facility] ENDOSCOPY;  Service: Endoscopy;  Laterality: N/A;   CORONARY ARTERY BYPASS GRAFT  2006   DENTAL SURGERY     implants   INSERT / REPLACE / REMOVE PACEMAKER  2006, 2012   MDT for sick sinus syndrome, gen change 8/12 by [facility]   KIDNEY STONE SURGERY  1984   Patient Active Problem List   Diagnosis Date Noted   HOH (hard of hearing) 12/18/2020   Personal history of colonic polyps 08/31/2016   Abnormality of gait 08/27/2014   Memory difficulty 08/27/2014   Permanent atrial fibrillation (HCC) 09/08/2013   Cholelithiases 07/19/2013   Cholelithiasis 07/19/2013   Encounter for therapeutic drug monitoring 07/03/2013   Mechanical aortic valve 04/05/2013   Parkinson disease 09/27/2012   Orthostatic hypotension 04/01/2012   Pacemaker-[employer]tr[employer] 03/29/2012   HEPATITIS A 06/04/2010   HYPERTHYROIDISM 06/04/2010   HYPERLIPIDEMIA 06/04/2010   Essential hypertension 06/04/2010   Coronary artery disease involving native coronary artery of native heart without angina pectoris 06/04/2010   SICK SINUS SYNDROME 06/04/2010   HIATAL HERNIA 06/04/2010   SHORTNESS OF BREATH 06/04/2010   CHEST PAIN 06/04/2010   SPEECH THERAPY  DISCHARGE SUMMARY  Visits from Start of Care: 11  Current functional level related to goals / functional outcomes: See below. Pt is being discharged due to limited carryover to home, limited completion of requested tasks (loud /a/, recitation of everyday sentences), and aspiration precautions/ swallow strategies. PT and OT are discharging today as well.    Remaining deficits: Dysarthria, dysphagia, cognitive communication deficits   Education / Equipment: Aspiration precautions, s/sx of aspiration PNA, need to perform recommended tasks    Patient agrees to discharge. Patient goals were not met. Patient is being discharged due to lack of progress.. Pt may want to consider home health ST in the future, to ease/maximize carryover from therapy to home  environment.    ONSET DATE: referred 09/01/22  REFERRING DIAG: G20.B2 (ICD-10-CM) - Parkinson's disease with dyskinesia and fluctuating manifestations   THERAPY DIAG:  Dysphagia, oropharyngeal phase  Cognitive communication deficit  Dysarthria and anarthria  Rationale for Evaluation and Treatment: Rehabilitation  SUBJECTIVE:   SUBJECTIVE STATEMENT: SLP looked at pt's HEP tracker and last date marked on tracker was 10/26/22. "My blood's supposed to be 2-5 to 3-5 and it's 8." (After 2 requests for repeats by SLP) Pt accompanied by: self    PERTINENT HISTORY: Dx wtih PD 2012. MBSS in July 2020 revealed moderate dysphagia with significant pharyngeal residue, silent aspiration, reduce tongue base, epiglittic inversion, pharyngeal contraction, hocking and drool. Prior ST course 2022 at [facility].   PAIN:  Are you having pain? No  FALLS: Has patient fallen in last 6 months?  See PT evaluation for details   PATIENT GOALS: swallow and talk better  OBJECTIVE:   DIAGNOSTIC FINDINGS:  MBSS 10/09/22 Clinical Impression: Pt exhibits mild oral and severe pharyngeal dysphagia which has mild-moderately progressed since MBS in 2022. Orally he was able to control and propel boluses with mild delays and lingual residue. He silently aspirated thin, nectar and puree consistencies due to decreased hyoid excursion, laryngeal elevation and laryngeal vestibular closure. Nectar thick was aspirated before laryngeal closure could be achieved. Cues for strong cough did not clear aspirates and only briefly cleared penetrates before barium reappeared on vocal cords.  Reduced tongue base retraction, decreased epiglottic deflection and pharyngeal stripping wave led to max vallecular and pyriform sinus residue with puree and nectar. Multiple swallows were ineffective to decrease residue however chin tuck did appear to minimally reduce residue. Chin tuck during the swallow was not effective to protect airway nor  were right or left head turns. Options were discussed with pt and wife. His wishes are to continue eating and drinking and she states pt has not had pneumonia. Continued education re: risk if pt were to experience an illness or injury that risk of pneumona and decompnesation increases. Pt to continue regular texture,thin liquids, multiple swallows with chin tuck, hard coughs after swallows, alternate liquids and solids, remain upright 1 hour after meals and crush pills. Factors that may increase risk of adverse event in presence of aspiration [name] [employer] [name] 2021): Factors that may increase risk of adverse event in presence of aspiration [name] [employer] [name] 2021): Frail or deconditioned; Weak cough; Aspiration of thick, dense, and/or acidic materials; Frequent aspiration of large volumes Recommendations/Plan: Swallowing Evaluation Recommendations PO Diet Recommendation: Regular; Thin liquids (Level 0) Liquid Administration via: Cup Medication Administration: Crushed with puree Supervision: Patient able to self-feed; Full supervision/cueing for swallowing strategies Swallowing strategies  : Slow rate; Small bites/sips; Chin tuck; Follow solids with liquids; Multiple dry swallows after each bite/sip; Hard cough  after swallowing Postural changes: Stay upright 30-60 min after meals; Position pt fully upright for meals Oral care recommendations: Oral care BID (2x/day)  PATIENT REPORTED OUTCOME MEASURES (PROM): PROM was completed (CES) with a score of 13/32. Higher scores indicate greater effectiveness of communication.   TODAY'S TREATMENT:                                                                                                                                         DATE:  11/06/22: SLP targeted dysphagia strategies as this is most medically compromising issue with pt from ST perspective. Pt chose strawberry bar and drank water. SLP reviewed precautions with pt prior to PO intake, and explained  rationale for each one. SLP provided consistent mod cues faded to consistent min cues for pt to follow precautions. Pt coughed with larger sips H2O initially (3/3 sips), until SLP demonstrated what small sips may feel like to pt. After this pt coughed with liquids 1/4. After PO intake pt coughed up approx macaroni-sized piece of strawberry bar. SLP used this to reiterate to pt that he needed to double or triple swallow with chin tuck, and alternate between small bites and small sips. Pt agreed with d/c today.  11/02/22: SPEECH: SLP and pt completed loud /a/ together simultaneously. SLP completed 10 reps with pt. SLP provided consistent cues for full breath. Reiterated to wife for pt to complete at least 5 reps BID and wife shared pt did not complete any reps in last 7 days due to nausea. Of note, wife understood 6 of pt's 7 utterances when she was observing today, while SLP understood 3/7. SWALLOW: [name] stated pt, coughed at end of lunch, coughed up a bolus of greens (pt had greens for lunch). SLP postulated that pt was not following precaution of small bite, dry swallow/s after bolus clears oral cavity, and alternating bite/sip. Because of this, SLP used today's session as practice for pt to follow swallow precautions, which he did with max cues, consistently, using written cues. SLP encouraged [name] to use this technique at home and she stated that at home pt demonstrates high level of resistance to performing strategies with any assistance. SLP educated wife on controlling bite size by cutting up bites for pt and she stated "I ask him if he wants me to cut it up for him and he says, 'No.' " SLP educated pt and wife what occurs when pt swallows, based on results of MBS. SLP strongly discouraged dual-consistency foods and explained what these are for pt and wife. AT times SLP used written cues for pt when he demo'd he did not understand SLP due to pt's reduced hearing level. Pt cont without overt s/sx PNA.  Pt/wife need cont education on overt s/sx aspiration PNA.  10/26/22: Pt was out of town Thurs-Saturday and has no checkmarks on his homework sheet since the mark SLP put during [name]'s last  ST session on 10/21/22.  Pt benefited from simultaneous production of loud /a/ for first 4/5 reps. Reps very short in length despite SLP consistent cues for full breath and simultaneous production with SLP. Siler read his everyday sentences with consistent mod-max A for adequate breath support for each sentence - pt attempted to read off 2-3 sentences on one breath. In conversation with SLP pt benefited from usual max A for louder speech.   SLP wanted to bring [facility] back today for remainder of session, in order to ask how Wilber was doing at home with swallow precautions and louder speech but she was not in lobby when SLP went out at 1433. SLP asked [name] about joining in ST session next time for 10-15 minutes and she said she would be happy to do that.   10/21/22: SWALLOW: Given pt's lack of adherence to speech HEP SLP does not think pt will perform swallowing HEP with any frequency to foster increasing muscular strength for swallowing. SLP assisted pt when eating fig bar and drinking water. Pt req'd consistent cues for all precautions but small sips/bites. When SLP cued pt to cough after small bite fig bar he coughed the entire bite up. SLP told wife this and strongly urged her to attend pt while he is eating and provide him with cues for following precautions. Later in session when SLP charting, [place] took sip and SLP turned around to see pt taking consecutive sips from a straw and promptly coughed immediately afterwards. SLP used this example to reiterate to pt to use small sips. SPEECH: Loud /a/ produced today simultaneously with SLP for first 3 reps, and pt independent on last two productions, all with < 5 second length. SLP cued [place] to perform his sentences and req'd consistent mod A for breath support for  each sentence - pt wanted to read off as many as possible on one breath. SLP worked with pt in functional context for speech - short 1-2 minute conversational segments. Pt talked about family with occasional min-mod A for "shout so I can hear you."  10/19/22: Pt, in OT session, was watched by SLP to see if he followed any aspiration precautions with water. He did not. SLP reminded him he needed to take small sips, swallow at least twice, and cough and reswallow last swallow. In simple 7 minute conversation about events yesterday pt req'd usual mod-max A for speech intelligibility. Frequency of cueing did not lessen cueing level needed by SLP.  Pt had three days where he completed HEP BID in the last week. With /a/, pt benefited from simultaneous SLP production, as well as simultaneous production with SLP with cues for breath before each sentence with everyday sentences. SLP instructed pt to repeat sentences and reminded him he needs to shout with every response he provides. He told SLP [name] asks him to repeat almost everything he says - SLP correlated the fact he needs to shout with being more intelligible and [name] won't have to ask him to repeat as often if he shouts everything he says. He acknowledged understanding. The next 7 minutes SLP engaged pt in conversation and intelligibility improved to 70%, and 80% if SLP nonverbally cued pt he was not understood.   10/16/22: SPEECH: 8 minute simple conversation with pt - pt req'd usual mod A for speech intelligibility strategy usage -without pt using overarticulation and louder speech SLP understood 80%. SLP noted wife demonstrated understanding of pt 95% of the time. SWALLOW: Pt req'd initial consistent min-mod A  for following precautions (except small bites/sips) with Dys III/thin, faded to usual min A for double swallows, cough, and alternate bite/sip.  10/14/22: SWALLOW: Pt with MBS 10/09/22. Results above. SLP strongly reiterated results of exam with  pt/wife. SLP practiced recommendations with POs with pt, who required mod A usually for chin tuck, double swallows, and cough. Fading cues to min A occasionally for other precautions. Pt took small bites until end of session when SLP was not looking and then he took x2 bite size that he had been taking with SLP observing. SLP told pt to take smaller bites to mitigate risk of aspiration. SLP informed pt and wife that wife may need to assist pt during meals. SLP asked [place] if this was ok and he agreed. Pt is not performing speech intelligibilty exercises as directed - SLP is cautious to recommend HEP for dysphagia, suspecting pt will also not complete as directed. SPEECH: He performed loud /a/ with consistent simultaneous production and usual mod cues for full breath before production. Held loud /a/ for 5 seconds average. [name] got out pt's sentences for him and he req'd consistent max cues for taking breath for each one and producing loud speech for each of them. SLP looked at pt's practice sheet and he is performing once/day. SLP strongly stressed BID completion and told pt and wife rationale.   10/07/22: Pt told SLP MBS scheduled for Friday 10/09/22. Arrived with "wet" voice and took x3 attempts to clear. Once cleared, pt did not have to clear again for remainder of session. SLP provided max A for /a/ x10 - simultaneous production needed. SLP provided max A to slow down and read sentences using one breath per sentence, and shout sentences. SLP worked remainder of session with consistent mod cues to "shout" when pt speaks in sentence responses. When doing so, SLP understood [name] 75% of the time, however even with a cue to shout, [place] only used incr'd volume and articulation 70% of the time. When not "shouting", SLP understood [name] ~20% of the time.  Pt had reportedly completed exercises twice yesterday.   10/05/22: SLP cont to require to use very loud speech volume for pt to demonstrate  understanding of SLP speech. Since last session pt has completed /a/ and sentences once/day, with two days BID. SLP told pt to complete every day BID.  Simultaneous production necessary for loud /a/ - otherwise pt with loudness decay after 2-3 seconds. SLP told [name]) she may need to complete loud /a/ with pt based upon today's production/s. SLP wonders the effectiveness of pt's practice thus far, if wife is not assisting pt. SLP cont to remind pt throughout session to "shout, so we can understand you" and that pt normal speech is mumbling in nature and this cannot be understood. Pt benefited from occasional cues to incr volume for the remaining time throughout the session (14 minutes) to "shout". [name] endorsed it was easier to hear pt during session today than it is at home, and especially in the car. SLP told [name] to use "shout" as a cue for pt at home and also in the car, to see if this improves pt's loudness/clarity.  09/30/22: Pt wife waiting on a call back from scheduler for MBSS. SLP required to provide a very loud speaking volume for pt to respond, or ask SLP for repeat. [name] stated pt had hearing aids serviced "about a month ago." SLP may suggest return to hearing aid dispenser for service/checkup. Max cues needed for loud /a/  including modeling. Pt average loudness upper 80s dB with average 6 seconds, even with max cues for longer production. SLP told pt 10 reps, BID. SLP educated pt/wife that pt and wife should generate 10 everyday sentences to practice at home BID after the loud /a/ reps. When SLP inquired how consistent pt is with performing other HEPs in the past [name] stated, "He isn't too good with doing (the HEP) at home." SLP reiterated that Schreier must do HEP at least 5 days/week for there to be any possibility of progress with speech intelligibility and/or with swallow strength.SLP provided pt/wife with HEP checklist for completion with "ahhhhhh" and "everyday sentences"  slots, to encourage HEP completion. At the end of session wife provided Tomaso with a sip of water and pt immediately coughed after an audible swallow.  09/28/22: Initiated training on dysarthria strategies: slow, loud, over-articulate, with practice at phrase reading and generative levels. Direct model required to optimize production and improve intelligibility. Education on POC and goals, to which pt and spouse verbalize agreement.   PATIENT EDUCATION: Education details: see above Person educated: Patient and Spouse Education method: Explanation, Demonstration, Verbal cues, and Handouts Education comprehension: verbalized understanding, returned demonstration, verbal cues required, and needs further education  HOME EXERCISE PROGRAM: Sustained "ah," oral reading   GOALS: Goals reviewed with patient? Yes  SHORT TERM GOALS: Target date: 10/26/2022  Pt will participate in MBSS Baseline: Goal status: MET  2.  Pt will demonstrate use of swallow compensations with use of visual aid and occasional verbal cues during structured practice Baseline:  Goal status: Not met  3.  Pt will use dysarthria strategies, resulting in intelligible production of 15/20 sentences  Baseline:  Goal status: Not met  4.  Pt will demonstrate dysphagia exercises with mod-A over 2 sessions  Baseline:  Goal status: Deferred - dysarthria HEP not performed at a therapeutic frequency   LONG TERM GOALS: Target date: 11/23/2022  Pt will complete dysphagia and dysarthria HEP 5/7 days over 1 week period Baseline:  Goal status: not met  2.  Pt and spouse will report carryover of swallow compensations and strategies at home Baseline:  Goal status: not met  3.  Pt will use dysarthria strategies during 10 minute conversation, resulting in 80% intelligibility  Baseline:  Goal status: not met  ASSESSMENT:  CLINICAL IMPRESSION: Patient is a 82 y.o. M who was seen in ST for dysphagia, and for moderate dysarthria,  c/b low vocal intensity and imprecise articulation which results in significantly decreased intelligibility. Pt would benefit from skilled ST to address aforementioned deficits to reduce caregiver burden , enhance communication efficacy, and facilitate improved swallow function/safety . HEP is not being performed to therapeutic frequency. Pt met goal for MBSS, however did not meet any other goals. See today's note for more details. SLP will likely suggest pt's next ST course be with home health to maximize carryover to pt's environment.  OBJECTIVE IMPAIRMENTS: Objective impairments include attention, memory, executive functioning, dysarthria, and dysphagia. These impairments are limiting patient from effectively communicating at home and in community and safety when swallowing.Factors affecting potential to achieve goals and functional outcome are ability to learn/carryover information and co-morbidities.. Patient will benefit from skilled SLP services to address above impairments and improve overall function.  REHAB POTENTIAL: Good  PLAN:  SLP FREQUENCY: 2x/week  SLP DURATION: 8 weeks  PLANNED INTERVENTIONS: Aspiration precaution training, Pharyngeal strengthening exercises, Diet toleration management , Environmental controls, Trials of upgraded texture/liquids, Cueing hierachy, Internal/external aids, Functional tasks, Multimodal  communication approach, SLP instruction and feedback, Compensatory strategies, Patient/family education, and Re-evaluation    Trevon Strothers, CCC-SLP 11/06/2022, 11:30 AM

## 2022-11-06 NOTE — Patient Instructions (Signed)
   Signs of Aspiration Pneumonia   Chest pain/tightness Fever (can be low grade) Cough  With foul-smelling phlegm (sputum) With sputum containing pus or blood With greenish sputum Fatigue  Shortness of breath  Wheezing   **IF YOU HAVE THESE SIGNS, CONTACT YOUR DOCTOR OR GO TO THE EMERGENCY DEPARTMENT OR URGENT CARE AS SOON AS POSSIBLE**     

## 2022-11-06 NOTE — Patient Instructions (Signed)
TAKE 2  TABLETS TODAY ONLY THEN CONTINUE 1 tablet daily except 1/2 tablet on Sundays, Tuesdays, and Thursdays. INR in 1 week. Call [phone] call with any new medications or if scheduled for any other procedures.  Clearance/Procedure Fax #[phone]

## 2022-11-06 NOTE — Therapy (Signed)
OUTPATIENT OCCUPATIONAL THERAPY [facility]ARKIN[facility]'S  Treatment Note & Discharge  Patient Name: Donald [name] MRN: [id number] DOB:27-Mar-1941, 82 y.o., male Today's Date: 11/06/2022  PCP: Gaspar Garbe, MD REFERRING PROVIDER: Tat, Octaviano Batty, DO  OCCUPATIONAL THERAPY DISCHARGE SUMMARY  Visits from Start of Care: 10  Current functional level related to goals / functional outcomes: Pt continues to experience multiple falls, complicated by elevated INR and hypotension.  Pt met 2 LTGs, however greatly limited by decreased coordination and sensation impacting coordination with [facility] and dressing tasks.  Pt has required increased assistance over last ~2 weeks due to medical complications.   Remaining deficits: Impaired balance/frequent falls, impaired coordination and sensation, decreased safety awareness.   Education / Equipment: Publishing rights manager, education on modifications to tasks to increase success with grasp and sequencing during self-feeding and other ADLs   Patient agrees to discharge. Patient goals were partially met. Patient is being discharged due to did not respond to therapy.  Pt also have medical concerns.     END OF SESSION:  OT End of Session - 11/06/22 0955     Visit Number 10    Number of Visits 17    Date for OT Re-Evaluation 11/27/22    Authorization Type [employer]    OT Start Time 0935    OT Stop Time 1015    OT Time Calculation (min) 40 min    Activity Tolerance No increased pain;Patient tolerated treatment well    Behavior During Therapy [facility] for tasks assessed/performed;Impulsive                Past Medical History:  Diagnosis Date   Abnormality of gait 08/27/2014   Anxiety    Aortic stenosis    s/p AVR by Dr Laneta Simmers   Arthritis    left wrist- probable- not diagonised   Asthma    in the past   CAD (coronary artery disease)    s/p CABG 2006   GERD (gastroesophageal reflux disease)    H/O seasonal allergies    H/O: rheumatic fever     Hearing deficit    Bilateral hearing aids   Hematuria    had CT scan   Hiatal hernia    History of kidney stones    HOH (hard of hearing)    Hyperlipidemia    Hypertension    Hyperthyroidism    Inguinal hernia    right side; watching at this time per wife's report   Memory difficulty 08/27/2014   Pacemaker    Parkinson's disease    Persistent atrial fibrillation (HCC)    Scarlet fever    as a child   Shortness of breath    Sick sinus syndrome (HCC)    s/p PPM (MDT) 2006   Past Surgical History:  Procedure Laterality Date   AORTIC VALVE REPLACEMENT  2006   CARDIOVERSION N/A 09/14/2013   Procedure: CARDIOVERSION;  Surgeon: Thurmon Fair, MD;  Location: [facility] ENDOSCOPY;  Service: Cardiovascular;  Laterality: N/A;   CHOLECYSTECTOMY N/A 07/21/2013   Procedure: LAPAROSCOPIC CHOLECYSTECTOMY;  Surgeon: Mariella Saa, MD;  Location: [facility] OR;  Service: General;  Laterality: N/A;   COLONOSCOPY W/ POLYPECTOMY     COLONOSCOPY WITH PROPOFOL N/A 08/31/2016   Procedure: COLONOSCOPY WITH PROPOFOL;  Surgeon: Charlott Rakes, MD;  Location: [facility] ENDOSCOPY;  Service: Endoscopy;  Laterality: N/A;   CORONARY ARTERY BYPASS GRAFT  2006   DENTAL SURGERY     implants   INSERT / REPLACE / REMOVE PACEMAKER  2006, 2012  MDT for sick sinus syndrome, gen change 8/12 by [facility]   KIDNEY STONE SURGERY  1984   Patient Active Problem List   Diagnosis Date Noted   HOH (hard of hearing) 12/18/2020   Personal history of colonic polyps 08/31/2016   Abnormality of gait 08/27/2014   Memory difficulty 08/27/2014   Permanent atrial fibrillation (HCC) 09/08/2013   Cholelithiases 07/19/2013   Cholelithiasis 07/19/2013   Encounter for therapeutic drug monitoring 07/03/2013   Mechanical aortic valve 04/05/2013   Parkinson disease 09/27/2012   Orthostatic hypotension 04/01/2012   Pacemaker-Medtronic 03/29/2012   HEPATITIS A 06/04/2010   HYPERTHYROIDISM 06/04/2010   HYPERLIPIDEMIA 06/04/2010   Essential  hypertension 06/04/2010   Coronary artery disease involving native coronary artery of native heart without angina pectoris 06/04/2010   SICK SINUS SYNDROME 06/04/2010   HIATAL HERNIA 06/04/2010   SHORTNESS OF BREATH 06/04/2010   CHEST PAIN 06/04/2010    ONSET DATE: referral date 09/01/22 (PD diagnosis 2014)  REFERRING DIAG: G20.B2 (ICD-10-CM) - Parkinson's disease with dyskinesia and fluctuating manifestations  THERAPY DIAG:  Other symptoms and signs involving the nervous system  Unsteadiness on feet  Muscle weakness (generalized)  Other lack of coordination  Rationale for Evaluation and Treatment: Rehabilitation  SUBJECTIVE:   SUBJECTIVE STATEMENT: "My blood thinner got out of whack."  Pt reports that he is going to get his blood levels checked after therapy appt.  Pt reports falling 2x last night getting ready for bed, once in the bathroom.  Reports falling backwards after reaching to obtain items to brush teeth, unsure if he "passed out or what  Pt accompanied by: self & spouse [name] in waiting area   PERTINENT HISTORY: Parkinson's Disease. PMH: hx of falls (wears helment), arthritis, asthma, CAD, GERD, HOH, HLD, HTN, hyperthyroidism, anxiety, aortic stenosis, hx of aortic valve replacement 2006, memory difficulty, pacemaker, persistent a-fib, hx of scarlet fever, shortness of breath, hx of CABG 2006, cholecystectomy 2015, neuropathy  PRECAUTIONS: Fall; WEIGHT BEARING RESTRICTIONS: No  PAIN:  Are you having pain? None today reported  FALLS: Has patient fallen in last 6 months? Yes. Number of falls fall frequently, reports falling 3x today  LIVING ENVIRONMENT: Lives with: lives with their spouse Lives in: House/apartment Stairs:  ramped entrance Has following equipment at home: Grab bars, Ramped entry, and U-step walker  PLOF: Independent with basic ADLs and Requires assistive device for independence  PATIENT GOALS: to work on his balance   OBJECTIVE:  (All  objective assessments below are from initial evaluation on: 09/28/22 unless otherwise specified.)   HAND DOMINANCE: Left  ADLs: Overall ADLs: Pt reports it takes about 30 mins to get dressed, but nobody helps him with that.  Reports that he does not need any help with any bathroom transfers.  He utilizes a hook on pants button instead of buttoning and wears step in shoes to eliminate tying shoes. Transfers/ambulation related to ADLs: Utilizes U-step walker  Equipment: Grab bars and Walk in shower  IADLs: Light housekeeping: changes bed linen Medication management: wife administers Handwriting: 75% legible, shaky  MOBILITY STATUS: Hx of falls and ambulates with U-step walker and falls frequently  POSTURE COMMENTS:  rounded shoulders and forward head  FUNCTIONAL OUTCOME MEASURES: Fastening/unfastening 3 buttons: 5:16.46 (Pt utilizing all fingers intermittently to attempt to fasten buttons) Physical performance test: PPT#2 (simulated eating) 22.93 sec & PPT#4 (donning/doffing jacket): 2:41.1 (attempted for 45 seconds with inability to thread 2nd arm, therefore switched arms)  COORDINATION: 9 Hole Peg test: Right: 2.43.9; Left: 3:47.9 (pt  dropping multiple pegs and benefiting from picking up from table top by sliding off edge of table)  10/16/22: 9 hole peg test: R: 4:58 (pt dropping multiple pegs and transferring them from finger tips to palm and then attempting to place with gross grasp); L: 6:07  09/30/22: Box and blocks: L: 28 blocks, R: 37 blocks 10/19/22: box and blocks: L: 23 blocks, then 28 blocks and R: 43 blocks  UE ROM:   R shoulder flexion 110*, L shoulder flexion 110*  SENSATION: "Feels like ice"  COGNITION: Overall cognitive status: No family/caregiver present to determine baseline cognitive functioning and HOH makes this determination more difficult  OBSERVATIONS:    10/14/22: today he presents less impulsive than last session- more sedate, quite and slow. More typical of  PD.  He is still toe-walking.    TODAY'S TREATMENT:                                                                                                                              11/06/22 Large amplitude: utilized dowel for BUE use with focus on reaching overhead and then side to side (shoulder flexion and abduction) with focus on large amplitude movements while mirroring therapist for consistent pace as pt impulsive with movements.  OT attempting to slow pt down and keep consistent pace to allow for focus on increased quality of movement. Progressed to completing in standing with abduction with dowel to tap target.  OT providing CGA to Min A for standing balance while providing consistent beat during tapping of poles with movement.  Positioned pole at midline to facilitate forward reach, pt requiring Min assist due to anterior weight shift and near LOB. Dynamic standing: engaged in reaching towards items on table top and to shoulder height to challenge weight shifting and reaching across midline.  Pt tolerating standing 2-3 mins at a time, however upon sitting after 2nd bout pt requesting BP assessment.   BP assessed after standing bouts for 2-3 mins.  BP 95/65 HR 62.  BP assessed in sitting after ~5 mins 79/56, but immediately afterwards BP 97/65 still in sitting.   11/02/22 Lengthy discussion about pt's limitations.  Pt reports wanting to drive his tractor, get a closer shave, and wife assisting and/or hovering when pt stands due to frequency of falling and recent BP issues.  OT reiterated impact of low BP on balance and endurance as well as frequency of falls impacting need for supervision/assistance from wife.  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  OT reiterated impact of medications, BP, elevated INR, on bleeding risk if/when pt falls.  Pt also expressing frustration with needing to  use electric razor which does not allow for close shave.  Discussed option of having a professional shave him and/or looking for a new Neurosurgeon. Coordination: attempting to complete 9 hole peg test initially with LUE,  however after ~90 seconds pt requested to complete with RUE.  Pt then utilizing a combination of hands to complete primarily with RUE.  OT attempted to stop pt however he persisted, ultimately requiring 9:22 to place pegs.  Sensation greatly impacting ease with [facility] tasks.    10/21/22 Large amplitude: utilizing Resistance Clothespins 6# with LUE for low and high functional reaching in standing at high table.  Focus placed on functional reaching with LUE with focus on large amplitude movements of feet apart, incorporating weight shift and trunk rotation, and opening hand.  Pt with difficulty with timing of grasp and needing assistance of table top for improved grasp with LUE.  Self-feeding: Engaged in scooping dried beans with and without built up foam handle, no difference noted therefore removed foam handle.  Pt completed PPT#2 in 11.79 sec with significant improvement from evaluation.  Coordination: Placing various sized pegs in arc pegboard with each UE with min-mod cueing for shoulder compensation and for tip pinch (particularly with L hand) as pt with tendency to utilize cylindrical grasp with LUE.  Pt with improved in-hand manipulation, but increased difficulty with smallest pegs. OT placing wash cloth under pegs to add friction to increase success with picking up smallest pegs. Ambulation: pt demonstrating toe walking and shuffling gait into treatment space, however after session pt with initial toe walking but with cues for steady gait pt demonstrating improved step length and able to carry out to entrance of clinic without additional cues.    PATIENT EDUCATION: Education details: ongoing condition specific education Person educated: Patient and Spouse Education method:  Explanation Education comprehension: verbalized understanding and needs further education  HOME EXERCISE PROGRAM: Access Code: [email or web address] URL: [email or web address] Date: 10/07/2022 Prepared by: Fannie Knee   GOALS: Goals reviewed with patient? Yes  SHORT TERM GOALS: Target date: 10/30/22  Pt will perform PD-specific HEP with cueing prn from caregiver  Baseline: Goal status: IN PROGRESS  2.  Pt will improve R hand coordination for ADLs (buttoning/tying shoes) as shown by completing 9-hole peg test in 2 min or less  Baseline: Right: 2.43.9; Left: 3:47.9  Goal status: NOT MET  3.  Pt will improve L hand coordination for ADLs (buttoning/tying shoes) as shown by completing 9-hole peg test in 3 mins or less  Baseline: Right: 2.43.9; Left: 3:47.9  Goal status: NOT MET  4.  Pt/caregiver will verbalize understanding of ways to decr risk of future complcations related to PD (including falls).  Baseline:  Goal status: MET   LONG TERM GOALS: Target date: 11/27/22  Pt/caregiver will verbalize understanding of AE/strategies to increase ease, safety, and independence with ADLs/IADLs  Baseline:  Goal status: NOT MET  2.  Pt will improve functional reaching/coordination for ADLs as shown by improving score on box and blocks test by at least 5 with dominant LUE  Baseline:  L: 28 blocks, R: 37 blocks Goal status: NOT MET  3.  Pt will complete 3 button/unbutton in 4 mins or less with use of AE PRN to demonstrate increased coordination and motor control to manage clothing fasteners. Baseline:  5:16.46  Goal status: NOT MET  4.  Pt will demonstrate improved ease with feeding as evidenced by decreasing PPT#2 (self feeding) by 3 secs Baseline: PPT#2 (simulated eating) 22.93 sec  Goal status: MET- 11.79 sec on 10/21/22  5.  Pt will demonstrate ability to retrieve a lightweight object at moderate range to increase shoulder flexion. Baseline:  Goal status: MET -  11/06/22  ASSESSMENT:  CLINICAL IMPRESSION:  Engaged in discussion with spouse about medical issues impacting participation in therapy sessions due to elevated INR and hypotension.  Pt does well during therapy sessions, however with little to no carryover at home due to decreased recall of education and impulsivity.  Pt has been limited in progress towards goals, as pt's symptoms seem to fluctuate depending on time of day and also interaction of underlying conditions. Pt continues to require significantly increased time for [facility] tasks due to impaired sensation and decreased coordination (largely due to sensation).  Pt would benefit from d/c from therapy at this time to pursue rectifying medical issues and will f/u with OT as indicated thereafter.  Pt may benefit from [facility] and/or caregiver assistance if medical issues and falls continue to persist.  PLAN:  OT FREQUENCY: 2x/week  OT DURATION: 8 weeks  PLANNED INTERVENTIONS: self care/ADL training, therapeutic exercise, therapeutic activity, neuromuscular re-education, passive range of motion, balance training, functional mobility training, ultrasound, compression bandaging, moist heat, cryotherapy, patient/family education, cognitive remediation/compensation, psychosocial skills training, energy conservation, coping strategies training, and DME and/or AE instructions  CONSULTED AND AGREED WITH PLAN OF CARE: Patient and family member/caregiver  PLAN FOR NEXT SESSION: D/C to assess medical needs   Rosalio Loud, OTR/L 11/06/2022, 1:04 PM

## 2022-11-09 ENCOUNTER — Ambulatory Visit: Payer: PPO | Admitting: Occupational Therapy

## 2022-11-09 ENCOUNTER — Ambulatory Visit: Payer: PPO

## 2022-11-11 ENCOUNTER — Encounter: Payer: Self-pay | Admitting: Podiatry

## 2022-11-11 ENCOUNTER — Ambulatory Visit (INDEPENDENT_AMBULATORY_CARE_PROVIDER_SITE_OTHER): Payer: PPO | Admitting: Podiatry

## 2022-11-11 VITALS — BP 89/55 | HR 65

## 2022-11-11 DIAGNOSIS — H04123 Dry eye syndrome of bilateral lacrimal glands: Secondary | ICD-10-CM | POA: Diagnosis not present

## 2022-11-11 DIAGNOSIS — Z961 Presence of intraocular lens: Secondary | ICD-10-CM | POA: Diagnosis not present

## 2022-11-11 DIAGNOSIS — L84 Corns and callosities: Secondary | ICD-10-CM | POA: Diagnosis not present

## 2022-11-11 DIAGNOSIS — M79676 Pain in unspecified toe(s): Secondary | ICD-10-CM | POA: Diagnosis not present

## 2022-11-11 DIAGNOSIS — B351 Tinea unguium: Secondary | ICD-10-CM

## 2022-11-11 NOTE — Progress Notes (Signed)
  Subjective:  Patient ID: Donald [name], male    DOB: 1940/12/29,  MRN: [id number]  Donald [name] presents to clinic today for: callus(es) right foot and painful thick toenails that are difficult to trim. Painful toenails interfere with ambulation. Aggravating factors include wearing enclosed shoe gear. Pain is relieved with periodic professional debridement. Painful calluses are aggravated when weightbearing with and without shoegear. Pain is relieved with periodic professional debridement. He is accompanied by his wife on today's visit. Chief Complaint  Patient presents with   Nail Problem    "Just a toenail check and he has a place on the bottom of his right foot that needs to be filed down."   PCP is Tisovec, Adelfa Koh, MD.  Allergies  Allergen Reactions   Iohexol Itching    Pt reports he had mild itching on one arm after CT with contrast. Subsided without intervention.    Sulfa Antibiotics     unknown    Review of Systems: Negative except as noted in the HPI.  Objective: No changes noted in today's physical examination. Vitals:   11/11/22 1331  BP: (!) 89/55  Pulse: 65   Donald [name] is a pleasant 82 y.o. male in NAD. AAO x 3.  Vascular Examination: Capillary refill time <3 seconds b/l LE. Palpable pedal pulses b/l LE. Digital hair present b/l. No pedal edema b/l. Skin temperature gradient WNL b/l. No varicosities b/l. No cyanosis or clubbing noted b/l LE.Marland Kitchen  Dermatological Examination: Pedal skin with normal turgor, texture and tone b/l. No open wounds. No interdigital macerations b/l. Toenails 1-5 b/l thickened, discolored, dystrophic with subungual debris. There is pain on palpation to dorsal aspect of nailplates. Hyperkeratotic lesion(s) plantar right foot.  No erythema, no edema, no drainage, no fluctuance..  Neurological Examination: Protective sensation intact with 10 gram monofilament b/l LE. Vibratory sensation intact b/l LE.   Musculoskeletal  Examination: Muscle strength 4/5 to all lower extremity muscle groups bilaterally. Utilizes rollator for ambulation assistance.  Assessment/Plan: 1. Pain due to onychomycosis of toenail   2. Callus   Patient was evaluated and treated. All patient's and/or POA's questions/concerns addressed on today's visit. Toenails 1-5 debrided in length and girth without incident. Continue soft, supportive shoe gear daily. Report any pedal injuries to medical professional. Call office if there are any questions/concerns. -Callus(es) plantar aspect right foot pared utilizing sterile scalpel blade without complication or incident. Total number debrided =1. -Patient/POA to call should there be question/concern in the interim.   Return in about 3 months (around 02/11/2023).  Freddie Breech, DPM

## 2022-11-12 ENCOUNTER — Ambulatory Visit: Payer: PPO | Admitting: Podiatry

## 2022-11-13 ENCOUNTER — Ambulatory Visit: Payer: PPO | Admitting: Occupational Therapy

## 2022-11-13 ENCOUNTER — Ambulatory Visit: Payer: PPO | Attending: Internal Medicine

## 2022-11-13 ENCOUNTER — Ambulatory Visit: Payer: PPO | Admitting: Physical Therapy

## 2022-11-13 DIAGNOSIS — Z5181 Encounter for therapeutic drug level monitoring: Secondary | ICD-10-CM | POA: Diagnosis not present

## 2022-11-13 LAB — POCT INR: INR: 3.1 — AB (ref 2.0–3.0)

## 2022-11-13 NOTE — Patient Instructions (Signed)
CONTINUE 1 tablet daily except 1/2 tablet on Sundays, Tuesdays, and Thursdays. INR in 2 weeks. Call [phone] call with any new medications or if scheduled for any other procedures.  Clearance/Procedure Fax #[phone]

## 2022-11-15 ENCOUNTER — Encounter: Payer: Self-pay | Admitting: Podiatry

## 2022-11-15 NOTE — Progress Notes (Unsigned)
Office Visit    Patient Name: Donald [name] Date of Encounter: 11/16/2022  PCP:  Gaspar Garbe, MD   [facility]  Cardiologist:  Donato Schultz, MD  Advanced Practice Provider:  No care team member to display Electrophysiologist:  Maurice Small, MD    HPI    Donald [name] is a 82 y.o. male with past medical history significant for hypertension, hyperlipidemia, atrial fibrillation on Coumadin, and aortic stenosis status post aortic valve replacement and CABG 2006 presents today for 88-month follow-up visit.  He saw Otilio Saber 07/01/2021 and was doing well at that time.  Most recent echo showed LVEF 50 to 55%, aortic dilation to 39 mm, mean gradient through mechanical AV was 35 and 22 mmHg.  He was referred to general cardiology at that point.  08/13/2021 he was seen by Dr.  Tyler Aas he is doing well.  He does have a history of falls.  He has had a pacemaker over 3 years.  He is tolerating his medications.  He reported some numbness at the tips of his fingers that was causing him to drop objects.  An echocardiogram was obtained and it was found that he has moderate aortic stenosis.  He was referred to the structural heart team.  He was seen by me 01/14/2022 and felt pretty good at that time. He had 1 episode of dizziness a couple of weeks ago.  He has not had any symptoms.  He denies any chest pain or shortness of breath.  He never got to see the structural heart folks for his aortic stenosis.  We will send another referral today.  Blood pressure still low normal.  He does come in with some blood pressure readings with systolic in the 80s.  We discussed increasing water intake to 64 ounces a day as well as incorporating some sodium into his diet.  We discussed also potentially needing medications to increase blood pressure but we want to try conservative measures first.  Otherwise, he is really not on any cardiac medicines.  He is taking Coumadin for his mechanical  aortic valve and most recent INR was 3.8.  He was told to eat some leafy greens.  Goal INR is 2.5-3.5.  He then saw Dr. Anne Fu 07/20/2022 and was doing well.  Tolerating medications at that time.  His pacemaker has 3 more years before needing a replacement.  Today, he is here with his wife.  She tells me he is still having a difficult time with low blood pressures.  Sometimes it falls to the 70s or 80s systolic.  Not drinking much other than a few sips of water a day with medication.  Discussed trying to increase water intake and that should help with his blood pressure.  Weakness in the morning when he stands which is new.  He now requires being wheeled to the kitchen in a seated position.  He has a neurologist for his Parkinson's.  Has lost a decent amount of weight and his medication has been titrated up to 12 tablets a day.  Unfortunately, also having some neuropathy symptoms.  However, worried about starting on gabapentin or Lyrica due to risk of falls.  Thyroid function recently was checked and was normal back in April.  Having some swallowing issues.  Most recent echocardiogram without AS, however previous echo showed moderate AS. He has been doing rock steady boxing twice a week but has not done it in the last few months.  Reports no shortness of breath nor dyspnea on exertion. Reports no chest pain, pressure, or tightness. No edema, orthopnea, PND. Reports no palpitations.   Past Medical History    Past Medical History:  Diagnosis Date   Abnormality of gait 08/27/2014   Anxiety    Aortic stenosis    s/p AVR by Dr Laneta Simmers   Arthritis    left wrist- probable- not diagonised   Asthma    in the past   CAD (coronary artery disease)    s/p CABG 2006   GERD (gastroesophageal reflux disease)    H/O seasonal allergies    H/O: rheumatic fever    Hearing deficit    Bilateral hearing aids   Hematuria    had CT scan   Hiatal hernia    History of kidney stones    HOH (hard of hearing)     Hyperlipidemia    Hypertension    Hyperthyroidism    Inguinal hernia    right side; watching at this time per wife's report   Memory difficulty 08/27/2014   Pacemaker    Parkinson's disease    Persistent atrial fibrillation (HCC)    Scarlet fever    as a child   Shortness of breath    Sick sinus syndrome (HCC)    s/p PPM (MDT) 2006   Past Surgical History:  Procedure Laterality Date   AORTIC VALVE REPLACEMENT  2006   CARDIOVERSION N/A 09/14/2013   Procedure: CARDIOVERSION;  Surgeon: Thurmon Fair, MD;  Location: [facility] ENDOSCOPY;  Service: Cardiovascular;  Laterality: N/A;   CHOLECYSTECTOMY N/A 07/21/2013   Procedure: LAPAROSCOPIC CHOLECYSTECTOMY;  Surgeon: Mariella Saa, MD;  Location: [facility] OR;  Service: General;  Laterality: N/A;   COLONOSCOPY W/ POLYPECTOMY     COLONOSCOPY WITH PROPOFOL N/A 08/31/2016   Procedure: COLONOSCOPY WITH PROPOFOL;  Surgeon: Charlott Rakes, MD;  Location: [facility] ENDOSCOPY;  Service: Endoscopy;  Laterality: N/A;   CORONARY ARTERY BYPASS GRAFT  2006   DENTAL SURGERY     implants   INSERT / REPLACE / REMOVE PACEMAKER  2006, 2012   MDT for sick sinus syndrome, gen change 8/12 by JA   KIDNEY STONE SURGERY  1984    Allergies  Allergies  Allergen Reactions   Iohexol Itching    Pt reports he had mild itching on one arm after CT with contrast. Subsided without intervention.    Sulfa Antibiotics     unknown    EKGs/Labs/Other Studies Reviewed:   The following studies were reviewed today: IMPRESSIONS     1. Septal motion consistent with conduction delay.. Left ventricular  ejection fraction, by estimation, is 50 to 55%. The left ventricle has low  normal function. There is mild left ventricular hypertrophy. Left  ventricular diastolic parameters are  indeterminate.   2. Right ventricular systolic function is normal. The right ventricular  size is mildly enlarged. There is moderately elevated pulmonary artery  systolic pressure.   3. Left atrial  size was severely dilated.   4. Right atrial size was severely dilated.   5. Mild mitral valve regurgitation.   6. S/P AVR (2006) with mechanical AVR. Unable to find any further  information. Peak and mean gradients through the valve are 35 and 23 mm  Hg. Dimensionless index is 0.3 consistent with moderate AS.   7. Aortic dilatation noted. There is mild dilatation of the ascending  aorta, measuring 39 mm.   8. The inferior vena cava is dilated in size with <50% respiratory  variability,  suggesting right atrial pressure of 15 mmHg.   FINDINGS   Left Ventricle: Septal motion consistent with conduction delay. Left  ventricular ejection fraction, by estimation, is 50 to 55%. The left  ventricle has low normal function. The left ventricular internal cavity  size was normal in size. There is mild left   ventricular hypertrophy. Left ventricular diastolic parameters are  indeterminate.   Right Ventricle: The right ventricular size is mildly enlarged. Right  vetricular wall thickness was not assessed. Right ventricular systolic  function is normal. There is moderately elevated pulmonary artery systolic  pressure. The tricuspid regurgitant  velocity is 3.44 m/s, and with an assumed right atrial pressure of 8 mmHg,  the estimated right ventricular systolic pressure is 55.3 mmHg.   Left Atrium: Left atrial size was severely dilated.   Right Atrium: Right atrial size was severely dilated.   Pericardium: Trivial pericardial effusion is present.   Mitral Valve: There is mild thickening of the mitral valve leaflet(s).  Mild mitral valve regurgitation.   Tricuspid Valve: The tricuspid valve is normal in structure. Tricuspid  valve regurgitation is mild.   Aortic Valve: S/P AVR (2006) with mechanical AVR. Unable to find any  further information. Peak and mean gradients through the valve are 35 and  23 mm Hg. Dimensionless index is 0.3 consistent with moderate AS. The  aortic valve has been  repaired/replaced.  Aortic valve regurgitation is mild. Aortic regurgitation PHT measures 406  msec. Aortic valve mean gradient measures 23.0 mmHg. Aortic valve peak  gradient measures 37.7 mmHg. Aortic valve area, by VTI measures 0.93 cm.  There is a mechanical valve present   in the aortic position. Procedure Date: 2006.   Pulmonic Valve: The pulmonic valve was normal in structure. Pulmonic valve  regurgitation is mild.   Aorta: The aortic root is normal in size and structure and aortic  dilatation noted. There is mild dilatation of the ascending aorta,  measuring 39 mm.   Venous: The inferior vena cava is dilated in size with less than 50%  respiratory variability, suggesting right atrial pressure of 15 mmHg.   IAS/Shunts: No atrial level shunt detected by color flow Doppler.   Additional Comments: A device lead is visualized.   EKG:  EKG is not ordered today.    Recent Labs: No results found for requested labs within last 365 days.  Recent Lipid Panel No results found for: "CHOL", "TRIG", "HDL", "CHOLHDL", "VLDL", "LDLCALC", "LDLDIRECT"  Risk Assessment/Calculations:   CHA2DS2-VASc Score = 4   This indicates a 4.8% annual risk of stroke. The patient's score is based upon: CHF History: 0 HTN History: 1 Diabetes History: 0 Stroke History: 0 Vascular Disease History: 1 Age Score: 2 Gender Score: 0     Home Medications   Current Meds  Medication Sig   amoxicillin (AMOXIL) 500 MG capsule Take 4 capsules by mouth as directed. Prior to dental appts   aspirin 81 MG tablet Take 81 mg by mouth daily.   atorvastatin (LIPITOR) 20 MG tablet TAKE ONE TABLET BY MOUTH EVERYDAY AT BEDTIME   carbidopa-levodopa (SINEMET CR) 50-200 MG tablet TAKE ONE TABLET BY MOUTH EVERYDAY AT BEDTIME   carbidopa-levodopa (SINEMET IR) 25-100 MG tablet take 2 tablets by mouth six times a day at 7am / 9:30 am/ 12 pm /2:30 pm /5 pm and 7:30 pm   esomeprazole (NEXIUM) 20 MG capsule Take 40 mg by  mouth daily.   levothyroxine (SYNTHROID, LEVOTHROID) 125 MCG tablet Take 125 mcg by mouth  daily.   midodrine (PROAMATINE) 10 MG tablet Take 1 tablet (10 mg total) by mouth 3 (three) times daily.   nitroGLYCERIN (NITROSTAT) 0.4 MG SL tablet PLACE 1 TABLET UNDER TONGUE EVERY 5 MINUTES FOR 3 DOSES AS NEEDED FOR CHEST PAIN   rOPINIRole (REQUIP) 2 MG tablet TAKE ONE TABLET BY MOUTH BEFORE BREAKFAST AND TAKE ONE TABLET BY MOUTH AT NOON AND TAKE ONE TABLET BY MOUTH EVERY EVENING   warfarin (COUMADIN) 5 MG tablet TAKE 1/2 TO 1 TABLET BY MOUTH daily OR AS DIRECTED by coumadin clinic     Review of Systems      All other systems reviewed and are otherwise negative except as noted above.  Physical Exam    VS:  BP 114/60   Pulse 71   Ht 5\' 8"  (1.727 m)   Wt 110 lb (49.9 kg)   SpO2 97%   BMI 16.73 kg/m  , BMI Body mass index is 16.73 kg/m.  Wt Readings from Last 3 Encounters:  11/16/22 110 lb (49.9 kg)  10/02/22 140 lb (63.5 kg)  07/20/22 144 lb 9.6 oz (65.6 kg)     GEN: Well nourished, well developed, in no acute distress. HEENT: normal. Neck: Supple, no JVD, carotid bruits, or masses. Cardiac: irregularly irregular, no murmurs, rubs, or gallops. No clubbing, cyanosis, edema.  Radials/PT 2+ and equal bilaterally.  Respiratory:  Respirations regular and unlabored, clear to auscultation bilaterally. GI: Soft, nontender, nondistended. MS: No deformity or atrophy. Skin: Warm and dry, no rash. Neuro:  Strength and sensation are intact. Psych: Normal affect.  Assessment & Plan    Moderate AS -structural heart referral needed? -most recent echo with no AS, inaccurate? -TEE would be the best option but unsure if patient would be able to tolerate this due to his issues with swallowing -one episode of dizziness but BP has also been as low as 80s systolic  Coronary artery disease -no chest pain -s/p CABG in 2006 -Continue current medication regimen with aspirin 81 mg daily, Lipitor 20 mg  daily, Coumadin 5 mg as directed by the Coumadin clinic, metoprolol discontinued due to hypotension  Orthostatic hypotension -Discussed 64 ounces of water fluid intake and incorporating a little bit of sodium -Discussed compression socks/hose from elastic therapy.  Handout provided. -Currently on midodrine 10 mg 3 times a day -Blood pressure well-controlled today but has been as low as 70 systolic at home at times  Parkinson's disease -Continue current dose of carbidopa-levodopa  Pacemaker ([employer]) -no recent issues  Mechanical aortic valve -on coumadin and monitored by coumadin clinic -Most recent INR 3.8  Permanent atrial fibrillation -CHA2DS2-VASc score of 4 -Patient is asymptomatic at this time -Continue anticoagulation -Rate controlled in the 70s today   Disposition: Follow up 6 months with Donato Schultz, MD or APP.  Signed, Sharlene Dory, PA-C 11/16/2022, 12:41 PM [facility]

## 2022-11-16 ENCOUNTER — Encounter: Payer: Self-pay | Admitting: Physician Assistant

## 2022-11-16 ENCOUNTER — Ambulatory Visit: Payer: PPO | Attending: Physician Assistant | Admitting: Physician Assistant

## 2022-11-16 ENCOUNTER — Encounter: Payer: Self-pay | Admitting: Neurology

## 2022-11-16 VITALS — BP 114/60 | HR 71 | Ht 68.0 in | Wt 110.0 lb

## 2022-11-16 DIAGNOSIS — I2510 Atherosclerotic heart disease of native coronary artery without angina pectoris: Secondary | ICD-10-CM | POA: Diagnosis not present

## 2022-11-16 DIAGNOSIS — G20A1 Parkinson's disease without dyskinesia, without mention of fluctuations: Secondary | ICD-10-CM

## 2022-11-16 DIAGNOSIS — Z950 Presence of cardiac pacemaker: Secondary | ICD-10-CM | POA: Diagnosis not present

## 2022-11-16 DIAGNOSIS — I350 Nonrheumatic aortic (valve) stenosis: Secondary | ICD-10-CM | POA: Diagnosis not present

## 2022-11-16 DIAGNOSIS — Z952 Presence of prosthetic heart valve: Secondary | ICD-10-CM | POA: Diagnosis not present

## 2022-11-16 DIAGNOSIS — I951 Orthostatic hypotension: Secondary | ICD-10-CM | POA: Diagnosis not present

## 2022-11-16 DIAGNOSIS — I4821 Permanent atrial fibrillation: Secondary | ICD-10-CM | POA: Diagnosis not present

## 2022-11-16 NOTE — Patient Instructions (Signed)
Medication Instructions:  Your physician recommends that you continue on your current medications as directed. Please refer to the Current Medication list given to you today.  *If you need a refill on your cardiac medications before your next appointment, please call your pharmacy*  Lab Work: None ordered If you have labs (blood work) drawn today and your tests are completely normal, you will receive your results only by: MyChart Message (if you have MyChart) OR A paper copy in the mail If you have any lab test that is abnormal or we need to change your treatment, we will call you to review the results.  Follow-Up: At [facility], you and your health needs are our priority.  As part of our continuing mission to provide you with exceptional heart care, we have created designated Provider Care Teams.  These Care Teams include your primary Cardiologist (physician) and Advanced Practice Providers (APPs -  Physician Assistants and Nurse Practitioners) who all work together to provide you with the care you need, when you need it.  Your next appointment:   6 month(s)  Provider:   Donato Schultz, MD   Other Instructions 1.Increase hydration, aim for at least 64 oz of water daily. 2.Be liberal with salt in your diet 3.Check your blood pressure daily, 1 hr after morning medications for 2 weeks, keep a log and send [place] the readings through mychart at the end of the 2 weeks.  Heart-Healthy Eating Plan Many factors influence your heart health, including eating and exercise habits. Heart health is also called coronary health. Coronary risk increases with abnormal blood fat (lipid) levels. A heart-healthy eating plan includes limiting unhealthy fats, increasing healthy fats, limiting salt (sodium) intake, and making other diet and lifestyle changes. What is my plan? Your health care provider may recommend that: You limit your fat intake to _________% or less of your total calories each day. You  limit your saturated fat intake to _________% or less of your total calories each day. You limit the amount of cholesterol in your diet to less than _________ mg per day. You limit the amount of sodium in your diet to less than _________ mg per day. What are tips for following this plan? Cooking Cook foods using methods other than frying. Baking, boiling, grilling, and broiling are all good options. Other ways to reduce fat include: Removing the skin from poultry. Removing all visible fats from meats. Steaming vegetables in water or broth. Meal planning  At meals, imagine dividing your plate into fourths: Fill one-half of your plate with vegetables and green salads. Fill one-fourth of your plate with whole grains. Fill one-fourth of your plate with lean protein foods. Eat 2-4 cups of vegetables per day. One cup of vegetables equals 1 cup (91 g) broccoli or cauliflower florets, 2 medium carrots, 1 large bell pepper, 1 large sweet potato, 1 large tomato, 1 medium white potato, 2 cups (150 g) raw leafy greens. Eat 1-2 cups of fruit per day. One cup of fruit equals 1 small apple, 1 large banana, 1 cup (237 g) mixed fruit, 1 large orange,  cup (82 g) dried fruit, 1 cup (240 mL) 100% fruit juice. Eat more foods that contain soluble fiber. Examples include apples, broccoli, carrots, beans, peas, and barley. Aim to get 25-30 g of fiber per day. Increase your consumption of legumes, nuts, and seeds to 4-5 servings per week. One serving of dried beans or legumes equals  cup (90 g) cooked, 1 serving of nuts is  oz (12 almonds, 24 pistachios, or 7 walnut halves), and 1 serving of seeds equals  oz (8 g). Fats Choose healthy fats more often. Choose monounsaturated and polyunsaturated fats, such as olive and canola oils, avocado oil, flaxseeds, walnuts, almonds, and seeds. Eat more omega-3 fats. Choose salmon, mackerel, sardines, tuna, flaxseed oil, and ground flaxseeds. Aim to eat fish at least 2 times  each week. Check food labels carefully to identify foods with trans fats or high amounts of saturated fat. Limit saturated fats. These are found in animal products, such as meats, butter, and cream. Plant sources of saturated fats include palm oil, palm kernel oil, and coconut oil. Avoid foods with partially hydrogenated oils in them. These contain trans fats. Examples are stick margarine, some tub margarines, cookies, crackers, and other baked goods. Avoid fried foods. General information Eat more home-cooked food and less restaurant, buffet, and fast food. Limit or avoid alcohol. Limit foods that are high in added sugar and simple starches such as foods made using white refined flour (white breads, pastries, sweets). Lose weight if you are overweight. Losing just 5-10% of your body weight can help your overall health and prevent diseases such as diabetes and heart disease. Monitor your sodium intake, especially if you have high blood pressure. Talk with your health care provider about your sodium intake. Try to incorporate more vegetarian meals weekly. What foods should I eat? Fruits All fresh, canned (in natural juice), or frozen fruits. Vegetables Fresh or frozen vegetables (raw, steamed, roasted, or grilled). Green salads. Grains Most grains. Choose whole wheat and whole grains most of the time. Rice and pasta, including brown rice and pastas made with whole wheat. Meats and other proteins Lean, well-trimmed beef, veal, pork, and lamb. Chicken and [place] without skin. All fish and shellfish. Wild duck, rabbit, pheasant, and venison. Egg whites or low-cholesterol egg substitutes. Dried beans, peas, lentils, and tofu. Seeds and most nuts. Dairy Low-fat or nonfat cheeses, including ricotta and mozzarella. Skim or 1% milk (liquid, powdered, or evaporated). Buttermilk made with low-fat milk. Nonfat or low-fat yogurt. Fats and oils Non-hydrogenated (trans-free) margarines. Vegetable oils,  including soybean, sesame, sunflower, olive, avocado, peanut, safflower, corn, canola, and cottonseed. Salad dressings or mayonnaise made with a vegetable oil. Beverages Water (mineral or sparkling). Coffee and tea. Unsweetened ice tea. Diet beverages. Sweets and desserts Sherbet, gelatin, and fruit ice. Small amounts of dark chocolate. Limit all sweets and desserts. Seasonings and condiments All seasonings and condiments. The items listed above may not be a complete list of foods and beverages you can eat. Contact a dietitian for more options. What foods should I avoid? Fruits Canned fruit in heavy syrup. Fruit in cream or butter sauce. Fried fruit. Limit coconut. Vegetables Vegetables cooked in cheese, cream, or butter sauce. Fried vegetables. Grains Breads made with saturated or trans fats, oils, or whole milk. Croissants. Sweet rolls. Donuts. High-fat crackers, such as cheese crackers and chips. Meats and other proteins Fatty meats, such as hot dogs, ribs, sausage, bacon, rib-eye roast or steak. High-fat deli meats, such as salami and bologna. Caviar. Domestic duck and goose. Organ meats, such as liver. Dairy Cream, sour cream, cream cheese, and creamed cottage cheese. Whole-milk cheeses. Whole or 2% milk (liquid, evaporated, or condensed). Whole buttermilk. Cream sauce or high-fat cheese sauce. Whole-milk yogurt. Fats and oils Meat fat, or shortening. Cocoa butter, hydrogenated oils, palm oil, coconut oil, palm kernel oil. Solid fats and shortenings, including bacon fat, salt pork, lard, and butter. Nondairy cream  substitutes. Salad dressings with cheese or sour cream. Beverages Regular sodas and any drinks with added sugar. Sweets and desserts Frosting. Pudding. Cookies. Cakes. Pies. Milk chocolate or white chocolate. Buttered syrups. Full-fat ice cream or ice cream drinks. The items listed above may not be a complete list of foods and beverages to avoid. Contact a dietitian for more  information. Summary Heart-healthy meal planning includes limiting unhealthy fats, increasing healthy fats, limiting salt (sodium) intake and making other diet and lifestyle changes. Lose weight if you are overweight. Losing just 5-10% of your body weight can help your overall health and prevent diseases such as diabetes and heart disease. Focus on eating a balance of foods, including fruits and vegetables, low-fat or nonfat dairy, lean protein, nuts and legumes, whole grains, and heart-healthy oils and fats. This information is not intended to replace advice given to you by your health care provider. Make sure you discuss any questions you have with your health care provider. Document Revised: 06/09/2021 Document Reviewed: 06/09/2021 Elsevier Patient Education  2024 [employer].

## 2022-11-17 ENCOUNTER — Other Ambulatory Visit: Payer: Self-pay

## 2022-11-17 DIAGNOSIS — G20B1 Parkinson's disease with dyskinesia, without mention of fluctuations: Secondary | ICD-10-CM

## 2022-11-18 ENCOUNTER — Other Ambulatory Visit: Payer: Self-pay | Admitting: Neurology

## 2022-11-18 DIAGNOSIS — G20A1 Parkinson's disease without dyskinesia, without mention of fluctuations: Secondary | ICD-10-CM

## 2022-11-19 ENCOUNTER — Emergency Department (HOSPITAL_COMMUNITY): Payer: PPO

## 2022-11-19 ENCOUNTER — Inpatient Hospital Stay (HOSPITAL_COMMUNITY)
Admission: EM | Admit: 2022-11-19 | Discharge: 2022-11-24 | DRG: 085 | Disposition: A | Discharge status: Hospice | Payer: PPO | Attending: Surgery | Admitting: Surgery

## 2022-11-19 ENCOUNTER — Other Ambulatory Visit: Payer: Self-pay

## 2022-11-19 DIAGNOSIS — R6889 Other general symptoms and signs: Secondary | ICD-10-CM | POA: Diagnosis not present

## 2022-11-19 DIAGNOSIS — K6389 Other specified diseases of intestine: Secondary | ICD-10-CM | POA: Diagnosis not present

## 2022-11-19 DIAGNOSIS — F419 Anxiety disorder, unspecified: Secondary | ICD-10-CM | POA: Diagnosis not present

## 2022-11-19 DIAGNOSIS — F0280 Dementia in other diseases classified elsewhere without behavioral disturbance: Secondary | ICD-10-CM | POA: Diagnosis not present

## 2022-11-19 DIAGNOSIS — I619 Nontraumatic intracerebral hemorrhage, unspecified: Secondary | ICD-10-CM

## 2022-11-19 DIAGNOSIS — S0990XA Unspecified injury of head, initial encounter: Secondary | ICD-10-CM | POA: Diagnosis not present

## 2022-11-19 DIAGNOSIS — I609 Nontraumatic subarachnoid hemorrhage, unspecified: Secondary | ICD-10-CM | POA: Diagnosis not present

## 2022-11-19 DIAGNOSIS — S3991XA Unspecified injury of abdomen, initial encounter: Secondary | ICD-10-CM | POA: Diagnosis not present

## 2022-11-19 DIAGNOSIS — R569 Unspecified convulsions: Secondary | ICD-10-CM | POA: Diagnosis not present

## 2022-11-19 DIAGNOSIS — R561 Post traumatic seizures: Secondary | ICD-10-CM | POA: Diagnosis not present

## 2022-11-19 DIAGNOSIS — Z515 Encounter for palliative care: Secondary | ICD-10-CM

## 2022-11-19 DIAGNOSIS — R220 Localized swelling, mass and lump, head: Secondary | ICD-10-CM | POA: Diagnosis not present

## 2022-11-19 DIAGNOSIS — M8588 Other specified disorders of bone density and structure, other site: Secondary | ICD-10-CM | POA: Diagnosis not present

## 2022-11-19 DIAGNOSIS — R451 Restlessness and agitation: Secondary | ICD-10-CM | POA: Diagnosis not present

## 2022-11-19 DIAGNOSIS — I6523 Occlusion and stenosis of bilateral carotid arteries: Secondary | ICD-10-CM | POA: Diagnosis not present

## 2022-11-19 DIAGNOSIS — S066X0A Traumatic subarachnoid hemorrhage without loss of consciousness, initial encounter: Secondary | ICD-10-CM | POA: Diagnosis not present

## 2022-11-19 DIAGNOSIS — R0689 Other abnormalities of breathing: Secondary | ICD-10-CM | POA: Diagnosis not present

## 2022-11-19 DIAGNOSIS — S066X1A Traumatic subarachnoid hemorrhage with loss of consciousness of 30 minutes or less, initial encounter: Secondary | ICD-10-CM | POA: Diagnosis not present

## 2022-11-19 DIAGNOSIS — R58 Hemorrhage, not elsewhere classified: Secondary | ICD-10-CM | POA: Diagnosis not present

## 2022-11-19 DIAGNOSIS — Z7901 Long term (current) use of anticoagulants: Secondary | ICD-10-CM

## 2022-11-19 DIAGNOSIS — S0191XA Laceration without foreign body of unspecified part of head, initial encounter: Secondary | ICD-10-CM | POA: Diagnosis not present

## 2022-11-19 DIAGNOSIS — Z882 Allergy status to sulfonamides status: Secondary | ICD-10-CM

## 2022-11-19 DIAGNOSIS — S22069A Unspecified fracture of T7-T8 vertebra, initial encounter for closed fracture: Secondary | ICD-10-CM | POA: Diagnosis present

## 2022-11-19 DIAGNOSIS — S299XXA Unspecified injury of thorax, initial encounter: Secondary | ICD-10-CM | POA: Diagnosis not present

## 2022-11-19 DIAGNOSIS — S0181XA Laceration without foreign body of other part of head, initial encounter: Secondary | ICD-10-CM | POA: Diagnosis not present

## 2022-11-19 DIAGNOSIS — Y92019 Unspecified place in single-family (private) house as the place of occurrence of the external cause: Secondary | ICD-10-CM | POA: Diagnosis not present

## 2022-11-19 DIAGNOSIS — Z79890 Hormone replacement therapy (postmenopausal): Secondary | ICD-10-CM | POA: Diagnosis not present

## 2022-11-19 DIAGNOSIS — H9190 Unspecified hearing loss, unspecified ear: Secondary | ICD-10-CM | POA: Diagnosis present

## 2022-11-19 DIAGNOSIS — Z91041 Radiographic dye allergy status: Secondary | ICD-10-CM | POA: Diagnosis not present

## 2022-11-19 DIAGNOSIS — G20A1 Parkinson's disease without dyskinesia, without mention of fluctuations: Secondary | ICD-10-CM | POA: Diagnosis present

## 2022-11-19 DIAGNOSIS — J90 Pleural effusion, not elsewhere classified: Secondary | ICD-10-CM | POA: Diagnosis not present

## 2022-11-19 DIAGNOSIS — W19XXXA Unspecified fall, initial encounter: Secondary | ICD-10-CM | POA: Diagnosis present

## 2022-11-19 DIAGNOSIS — Z4682 Encounter for fitting and adjustment of non-vascular catheter: Secondary | ICD-10-CM | POA: Diagnosis not present

## 2022-11-19 DIAGNOSIS — Z952 Presence of prosthetic heart valve: Secondary | ICD-10-CM

## 2022-11-19 DIAGNOSIS — I614 Nontraumatic intracerebral hemorrhage in cerebellum: Secondary | ICD-10-CM | POA: Diagnosis not present

## 2022-11-19 DIAGNOSIS — R5601 Complex febrile convulsions: Secondary | ICD-10-CM | POA: Diagnosis not present

## 2022-11-19 DIAGNOSIS — I4891 Unspecified atrial fibrillation: Secondary | ICD-10-CM | POA: Diagnosis not present

## 2022-11-19 DIAGNOSIS — Z66 Do not resuscitate: Secondary | ICD-10-CM | POA: Diagnosis present

## 2022-11-19 DIAGNOSIS — S066X9A Traumatic subarachnoid hemorrhage with loss of consciousness of unspecified duration, initial encounter: Secondary | ICD-10-CM | POA: Diagnosis not present

## 2022-11-19 DIAGNOSIS — R402112 Coma scale, eyes open, never, at arrival to emergency department: Secondary | ICD-10-CM | POA: Diagnosis present

## 2022-11-19 DIAGNOSIS — W1830XA Fall on same level, unspecified, initial encounter: Secondary | ICD-10-CM | POA: Diagnosis present

## 2022-11-19 DIAGNOSIS — R402312 Coma scale, best motor response, none, at arrival to emergency department: Secondary | ICD-10-CM | POA: Diagnosis not present

## 2022-11-19 DIAGNOSIS — R402212 Coma scale, best verbal response, none, at arrival to emergency department: Secondary | ICD-10-CM | POA: Diagnosis not present

## 2022-11-19 DIAGNOSIS — S0003XA Contusion of scalp, initial encounter: Secondary | ICD-10-CM | POA: Diagnosis present

## 2022-11-19 DIAGNOSIS — R404 Transient alteration of awareness: Secondary | ICD-10-CM | POA: Diagnosis not present

## 2022-11-19 DIAGNOSIS — R531 Weakness: Secondary | ICD-10-CM | POA: Diagnosis not present

## 2022-11-19 DIAGNOSIS — I6200 Nontraumatic subdural hemorrhage, unspecified: Secondary | ICD-10-CM | POA: Diagnosis not present

## 2022-11-19 DIAGNOSIS — J479 Bronchiectasis, uncomplicated: Secondary | ICD-10-CM | POA: Diagnosis present

## 2022-11-19 DIAGNOSIS — S06361A Traumatic hemorrhage of cerebrum, unspecified, with loss of consciousness of 30 minutes or less, initial encounter: Secondary | ICD-10-CM | POA: Diagnosis not present

## 2022-11-19 DIAGNOSIS — J9601 Acute respiratory failure with hypoxia: Secondary | ICD-10-CM | POA: Diagnosis not present

## 2022-11-19 DIAGNOSIS — S199XXA Unspecified injury of neck, initial encounter: Secondary | ICD-10-CM | POA: Diagnosis not present

## 2022-11-19 DIAGNOSIS — Z7401 Bed confinement status: Secondary | ICD-10-CM | POA: Diagnosis not present

## 2022-11-19 DIAGNOSIS — S3993XA Unspecified injury of pelvis, initial encounter: Secondary | ICD-10-CM | POA: Diagnosis not present

## 2022-11-19 DIAGNOSIS — I672 Cerebral atherosclerosis: Secondary | ICD-10-CM | POA: Diagnosis not present

## 2022-11-19 DIAGNOSIS — R0902 Hypoxemia: Secondary | ICD-10-CM | POA: Diagnosis not present

## 2022-11-19 DIAGNOSIS — G9600 Cerebrospinal fluid leak, unspecified: Secondary | ICD-10-CM | POA: Diagnosis not present

## 2022-11-19 DIAGNOSIS — R0609 Other forms of dyspnea: Secondary | ICD-10-CM | POA: Diagnosis not present

## 2022-11-19 DIAGNOSIS — I610 Nontraumatic intracerebral hemorrhage in hemisphere, subcortical: Secondary | ICD-10-CM | POA: Diagnosis not present

## 2022-11-19 LAB — PROTIME-INR
INR: 4 — ABNORMAL HIGH (ref 0.8–1.2)
INR: 1.6 — ABNORMAL HIGH (ref 0.8–1.2)
Prothrombin Time: 39.3 seconds — ABNORMAL HIGH (ref 11.4–15.2)
Prothrombin Time: 19 seconds — ABNORMAL HIGH (ref 11.4–15.2)

## 2022-11-19 LAB — I-STAT CHEM 8, ED
BUN: 17 mg/dL (ref 8–23)
Calcium, Ion: 1.12 mmol/L — ABNORMAL LOW (ref 1.15–1.40)
Chloride: 99 mmol/L (ref 98–111)
Creatinine, Ser: 0.5 mg/dL — ABNORMAL LOW (ref 0.61–1.24)
Glucose, Bld: 84 mg/dL (ref 70–99)
HCT: 29 % — ABNORMAL LOW (ref 39.0–52.0)
Hemoglobin: 9.9 g/dL — ABNORMAL LOW (ref 13.0–17.0)
Potassium: 3.8 mmol/L (ref 3.5–5.1)
Sodium: 134 mmol/L — ABNORMAL LOW (ref 135–145)
TCO2: 24 mmol/L (ref 22–32)

## 2022-11-19 LAB — COMPREHENSIVE METABOLIC PANEL
ALT: 7 U/L (ref 0–44)
AST: 13 U/L — ABNORMAL LOW (ref 15–41)
Albumin: 3.1 g/dL — ABNORMAL LOW (ref 3.5–5.0)
Alkaline Phosphatase: 93 U/L (ref 38–126)
Anion gap: 9 (ref 5–15)
BUN: 16 mg/dL (ref 8–23)
CO2: 23 mmol/L (ref 22–32)
Calcium: 8.3 mg/dL — ABNORMAL LOW (ref 8.9–10.3)
Chloride: 100 mmol/L (ref 98–111)
Creatinine, Ser: 0.66 mg/dL (ref 0.61–1.24)
GFR, Estimated: >60 mL/min (ref >60)
Glucose, Bld: 87 mg/dL (ref 70–99)
Potassium: 3.8 mmol/L (ref 3.5–5.1)
Sodium: 132 mmol/L — ABNORMAL LOW (ref 135–145)
Total Bilirubin: 1 mg/dL (ref 0.3–1.2)
Total Protein: 5.4 g/dL — ABNORMAL LOW (ref 6.5–8.1)

## 2022-11-19 LAB — GLUCOSE, CAPILLARY
Glucose-Capillary: 68 mg/dL — ABNORMAL LOW (ref 70–99)
Glucose-Capillary: 92 mg/dL (ref 70–99)

## 2022-11-19 LAB — URINALYSIS, ROUTINE W REFLEX MICROSCOPIC
Bilirubin Urine: NEGATIVE
Glucose, UA: NEGATIVE mg/dL
Ketones, ur: NEGATIVE mg/dL
Leukocytes,Ua: NEGATIVE
Nitrite: NEGATIVE
Protein, ur: NEGATIVE mg/dL
Specific Gravity, Urine: 1.01 (ref 1.005–1.030)
pH: 6 (ref 5.0–8.0)

## 2022-11-19 LAB — URINALYSIS, MICROSCOPIC (REFLEX): Squamous Epithelial / HPF: NONE SEEN /HPF (ref 0–5)

## 2022-11-19 LAB — CBC
HCT: 29.4 % — ABNORMAL LOW (ref 39.0–52.0)
Hemoglobin: 9.3 g/dL — ABNORMAL LOW (ref 13.0–17.0)
MCH: 26.2 pg (ref 26.0–34.0)
MCHC: 31.6 g/dL (ref 30.0–36.0)
MCV: 82.8 fL (ref 80.0–100.0)
Platelets: 190 10*3/uL (ref 150–400)
RBC: 3.55 MIL/uL — ABNORMAL LOW (ref 4.22–5.81)
RDW: 16.2 % — ABNORMAL HIGH (ref 11.5–15.5)
WBC: 3.9 10*3/uL — ABNORMAL LOW (ref 4.0–10.5)
nRBC: 0 % (ref 0.0–0.2)

## 2022-11-19 LAB — MRSA NEXT GEN BY PCR, NASAL: MRSA by PCR Next Gen: NOT DETECTED

## 2022-11-19 LAB — I-STAT ARTERIAL BLOOD GAS, ED
Acid-Base Excess: 0 mmol/L (ref 0.0–2.0)
Bicarbonate: 23.2 mmol/L (ref 20.0–28.0)
Calcium, Ion: 1.12 mmol/L — ABNORMAL LOW (ref 1.15–1.40)
HCT: 26 % — ABNORMAL LOW (ref 39.0–52.0)
Hemoglobin: 8.8 g/dL — ABNORMAL LOW (ref 13.0–17.0)
O2 Saturation: 100 %
Patient temperature: 35.4
Potassium: 3.5 mmol/L (ref 3.5–5.1)
Sodium: 134 mmol/L — ABNORMAL LOW (ref 135–145)
TCO2: 24 mmol/L (ref 22–32)
pCO2 arterial: 30.8 mmHg — ABNORMAL LOW (ref 32–48)
pH, Arterial: 7.479 — ABNORMAL HIGH (ref 7.35–7.45)
pO2, Arterial: 489 mmHg — ABNORMAL HIGH (ref 83–108)

## 2022-11-19 LAB — ABO/RH: ABO/RH(D): A NEG

## 2022-11-19 LAB — PREPARE RBC (CROSSMATCH)

## 2022-11-19 LAB — LACTIC ACID, PLASMA: Lactic Acid, Venous: 2 mmol/L (ref 0.5–1.9)

## 2022-11-19 LAB — TYPE AND SCREEN
ABO/RH(D): A NEG
Antibody Screen: NEGATIVE
Unit division: 0

## 2022-11-19 LAB — ETHANOL: Alcohol, Ethyl (B): <10 mg/dL (ref <10)

## 2022-11-19 LAB — BPAM RBC: Blood Product Expiration Date: 202407192359

## 2022-11-19 MED ORDER — IOHEXOL 350 MG/ML SOLN
75.0000 mL | Freq: Once | INTRAVENOUS | Status: AC | PRN
  Administered 2022-11-19: 75 mL via INTRAVENOUS

## 2022-11-19 MED ORDER — ACETAMINOPHEN 500 MG PO TABS: 1000.0000 mg | ORAL_TABLET | Freq: Four times a day (QID) | ORAL | Status: DC

## 2022-11-19 MED ORDER — LEVETIRACETAM IN NACL 1000 MG/100ML IV SOLN
1000.0000 mg | Freq: Once | INTRAVENOUS | Status: AC
  Administered 2022-11-19: 1000 mg via INTRAVENOUS
  Filled 2022-11-19: qty 100

## 2022-11-19 MED ORDER — FENTANYL CITRATE PF 50 MCG/ML IJ SOSY
25.0000 ug | PREFILLED_SYRINGE | Freq: Once | INTRAMUSCULAR | Status: AC
  Administered 2022-11-19: 25 ug via INTRAVENOUS

## 2022-11-19 MED ORDER — METOPROLOL TARTRATE 5 MG/5ML IV SOLN: 5.0000 mg | Freq: Four times a day (QID) | INTRAVENOUS | Status: DC | PRN

## 2022-11-19 MED ORDER — FENTANYL BOLUS VIA INFUSION
25.0000 ug | INTRAVENOUS | Status: DC | PRN
  Administered 2022-11-19: 25 ug via INTRAVENOUS

## 2022-11-19 MED ORDER — POLYETHYLENE GLYCOL 3350 17 G PO PACK: 17.0000 g | PACK | Freq: Every day | ORAL | Status: DC | PRN

## 2022-11-19 MED ORDER — POLYETHYLENE GLYCOL 3350 17 G PO PACK
17.0000 g | PACK | Freq: Every day | ORAL | Status: DC
  Administered 2022-11-19 – 2022-11-23 (×5): 17 g
  Filled 2022-11-19 (×6): qty 1

## 2022-11-19 MED ORDER — METHOCARBAMOL 500 MG PO TABS
500.0000 mg | ORAL_TABLET | Freq: Three times a day (TID) | ORAL | Status: AC
  Administered 2022-11-19 – 2022-11-22 (×8): 500 mg
  Filled 2022-11-19 (×9): qty 1

## 2022-11-19 MED ORDER — DOCUSATE SODIUM 100 MG PO CAPS: 100.0000 mg | ORAL_CAPSULE | Freq: Two times a day (BID) | ORAL | Status: DC

## 2022-11-19 MED ORDER — DOCUSATE SODIUM 50 MG/5ML PO LIQD
100.0000 mg | Freq: Two times a day (BID) | ORAL | Status: DC
  Administered 2022-11-19 – 2022-11-23 (×8): 100 mg
  Filled 2022-11-19 (×9): qty 10

## 2022-11-19 MED ORDER — METHOCARBAMOL 1000 MG/10ML IJ SOLN
500.0000 mg | Freq: Three times a day (TID) | INTRAVENOUS | Status: AC
  Administered 2022-11-21: 500 mg via INTRAVENOUS
  Filled 2022-11-19: qty 500
  Filled 2022-11-19: qty 5

## 2022-11-19 MED ORDER — PROPOFOL 1000 MG/100ML IV EMUL
0.0000 ug/kg/min | INTRAVENOUS | Status: DC
  Administered 2022-11-19 – 2022-11-20 (×2): 15 ug/kg/min via INTRAVENOUS
  Filled 2022-11-19: qty 100

## 2022-11-19 MED ORDER — DOCUSATE SODIUM 50 MG/5ML PO LIQD
100.0000 mg | Freq: Two times a day (BID) | ORAL | Status: DC
Start: 2022-11-19 — End: 2022-11-19

## 2022-11-19 MED ORDER — SODIUM CHLORIDE 0.9% IV SOLUTION: Freq: Once | INTRAVENOUS | Status: AC

## 2022-11-19 MED ORDER — ONDANSETRON HCL 4 MG/2ML IJ SOLN: 4.0000 mg | Freq: Four times a day (QID) | INTRAMUSCULAR | Status: DC | PRN

## 2022-11-19 MED ORDER — NOREPINEPHRINE 4 MG/250ML-% IV SOLN
2.0000 ug/min | INTRAVENOUS | Status: DC
  Administered 2022-11-19 – 2022-11-20 (×2): 2 ug/min via INTRAVENOUS
  Filled 2022-11-19 (×2): qty 250

## 2022-11-19 MED ORDER — FENTANYL BOLUS VIA INFUSION: 25.0000 ug | INTRAVENOUS | Status: DC | PRN

## 2022-11-19 MED ORDER — LEVETIRACETAM 100 MG/ML PO SOLN
500.0000 mg | Freq: Two times a day (BID) | ORAL | Status: DC
  Filled 2022-11-19: qty 5

## 2022-11-19 MED ORDER — ETOMIDATE 2 MG/ML IV SOLN
INTRAVENOUS | Status: AC | PRN
  Administered 2022-11-19: 20 mg via INTRAVENOUS

## 2022-11-19 MED ORDER — PROTHROMBIN COMPLEX CONC HUMAN 500 UNITS IV KIT
1589.0000 [IU] | PACK | Status: AC
  Administered 2022-11-19: 1589 [IU] via INTRAVENOUS
  Filled 2022-11-19: qty 1589

## 2022-11-19 MED ORDER — VITAMIN K1 10 MG/ML IJ SOLN
10.0000 mg | INTRAVENOUS | Status: AC
  Administered 2022-11-19: 10 mg via INTRAVENOUS
  Filled 2022-11-19: qty 1

## 2022-11-19 MED ORDER — LEVOTHYROXINE SODIUM 25 MCG PO TABS
125.0000 ug | ORAL_TABLET | Freq: Every morning | ORAL | Status: DC
  Administered 2022-11-20 – 2022-11-23 (×4): 125 ug
  Filled 2022-11-19 (×5): qty 1

## 2022-11-19 MED ORDER — SODIUM CHLORIDE 0.9 % IV SOLN
250.0000 mL | INTRAVENOUS | Status: DC
  Administered 2022-11-20: 250 mL via INTRAVENOUS

## 2022-11-19 MED ORDER — PROPOFOL 1000 MG/100ML IV EMUL
5.0000 ug/kg/min | INTRAVENOUS | Status: DC
  Administered 2022-11-19: 5 ug/kg/min via INTRAVENOUS
  Filled 2022-11-19: qty 100

## 2022-11-19 MED ORDER — ROPINIROLE HCL 0.5 MG PO TABS
1.0000 mg | ORAL_TABLET | Freq: Three times a day (TID) | ORAL | Status: DC
  Administered 2022-11-19 – 2022-11-23 (×12): 1 mg
  Filled 2022-11-19 (×12): qty 1
  Filled 2022-11-19: qty 2
  Filled 2022-11-19 (×2): qty 1

## 2022-11-19 MED ORDER — CARBIDOPA-LEVODOPA 25-100 MG PO TABS
2.0000 | ORAL_TABLET | Freq: Every day | ORAL | Status: DC
  Administered 2022-11-19 – 2022-11-23 (×21): 2
  Filled 2022-11-19 (×29): qty 2

## 2022-11-19 MED ORDER — LEVETIRACETAM IN NACL 500 MG/100ML IV SOLN
500.0000 mg | Freq: Two times a day (BID) | INTRAVENOUS | Status: DC
  Administered 2022-11-19 – 2022-11-24 (×10): 500 mg via INTRAVENOUS
  Filled 2022-11-19 (×12): qty 100

## 2022-11-19 MED ORDER — ACETAMINOPHEN 500 MG PO TABS
1000.0000 mg | ORAL_TABLET | Freq: Four times a day (QID) | ORAL | Status: DC
  Administered 2022-11-19 – 2022-11-21 (×6): 1000 mg
  Filled 2022-11-19 (×6): qty 2

## 2022-11-19 MED ORDER — DEXTROSE 50 % IV SOLN
12.5000 g | INTRAVENOUS | Status: AC
  Administered 2022-11-19: 12.5 g via INTRAVENOUS
  Filled 2022-11-19: qty 50

## 2022-11-19 MED ORDER — FENTANYL CITRATE PF 50 MCG/ML IJ SOSY: 25.0000 ug | PREFILLED_SYRINGE | Freq: Once | INTRAMUSCULAR | Status: DC

## 2022-11-19 MED ORDER — HYDRALAZINE HCL 20 MG/ML IJ SOLN: 10.0000 mg | INTRAMUSCULAR | Status: DC | PRN

## 2022-11-19 MED ORDER — ONDANSETRON 4 MG PO TBDP: 4.0000 mg | ORAL_TABLET | Freq: Four times a day (QID) | ORAL | Status: DC | PRN

## 2022-11-19 MED ORDER — FENTANYL 2500MCG IN NS 250ML (10MCG/ML) PREMIX INFUSION
25.0000 ug/h | INTRAVENOUS | Status: DC
  Administered 2022-11-19: 100 ug/h via INTRAVENOUS

## 2022-11-19 MED ORDER — CEFAZOLIN SODIUM-DEXTROSE 2-4 GM/100ML-% IV SOLN
2.0000 g | Freq: Once | INTRAVENOUS | Status: AC
  Administered 2022-11-19: 2 g via INTRAVENOUS
  Filled 2022-11-19: qty 100

## 2022-11-19 MED ORDER — FENTANYL 2500MCG IN NS 250ML (10MCG/ML) PREMIX INFUSION
25.0000 ug/h | INTRAVENOUS | Status: DC
  Administered 2022-11-19: 50 ug/h via INTRAVENOUS
  Filled 2022-11-19: qty 250

## 2022-11-19 MED ORDER — LACTATED RINGERS IV SOLN: INTRAVENOUS | Status: DC

## 2022-11-19 NOTE — ED Triage Notes (Signed)
Pt arrives [facility] from home where his wife heard him fall this am and found him prone on the floor with a large lac to the back of his head. Pt was unresponsive at the time, upon EMS arrival, pt had witnessed seizure activity and was given 2.5mg  Versed en route. On arrival pt was unresponsive and being bagged due to no purposeful breathing.  Pt does take Warfarin for a mechanical valve, takes midodrine and parkinsons meds.  Wife at bedside

## 2022-11-19 NOTE — Consult Note (Signed)
NAME:  Donald [name], MRN:  [id number], DOB:  04/14/1941, LOS: 0 ADMISSION DATE:  11/19/2022, CONSULTATION DATE: 11/19/2022 REFERRING MD: Dr. Charm Barges, CHIEF COMPLAINT: Vent management  History of Present Illness:  Patient was admitted following a ground-level fall Spouse heard him fall and found him face down with head laceration He had a seizure en route to the hospital for which he received Versed  GCS was 3 when he arrived in the ED, was intubated, noted to have purposeful movement grabbing towards ET tube  Background history of advanced Parkinson's disease, GERD, mitral valve replacement for which she was on anticoagulation Pertinent  Medical History  Advanced Parkinson's Mitral valve prolapse Intermittent falls  Significant Hospital Events: Including procedures, antibiotic start and stop dates in addition to other pertinent events   CT head with acute subarachnoid hemorrhage overlying the left temporal and parietal lobes, small hemorrhagic contusions in the left caudate tail and superior frontal gyrus, small volume intraventricular blood.  No hydrocephalus or midline shift.  Frontal scalp and periorbital soft tissue swelling CT chest with evidence of bronchiectasis, some mucous plugging, age-indeterminate fracture through the spinous process of T7  Interim History / Subjective:  Ground-level fall Patient is unresponsive, sedated  Objective   Blood pressure (!) 161/86, pulse (!) 50, temperature (!) 95.4 F (35.2 C), temperature source Rectal, resp. rate 18, height 5\' 9"  (1.753 m), weight 49.9 kg, SpO2 100 %.    Vent Mode: PRVC FiO2 (%):  [100 %] 100 % Set Rate:  [18 bmp] 18 bmp Vt Set:  [600 mL] 600 mL PEEP:  [5 cmH20] 5 cmH20 Plateau Pressure:  [16 cmH20] 16 cmH20  No intake or output data in the 24 hours ending 11/19/22 1255 Filed Weights   11/19/22 1245  Weight: 49.9 kg    Examination: General: Elderly, frail HENT: Moist oral mucosa Lungs: Fair air entry bilaterally  with coarse breath sounds at the bases Cardiovascular: S1-S2 appreciated Abdomen: Soft, bowel sounds appreciated Extremities: No clubbing, no edema Neuro: Unresponsive GU:   ABG 7.48/30/489 Chem-7 within normal limits CBC with mild anemia  Resolved Hospital Problem list     Assessment & Plan:  S/p ground-level fall Subarachnoid hemorrhage Intraparenchymal hemorrhage -Neurosurgery consulted -Patient will be admitted to the trauma service  Advanced Parkinson's on multiple medications Progressive decline in functional ability Recent falls-though falls are less frequent recently, is quite unsteady with his Parkinson's  Hypoxemic respiratory failure Continue mechanical ventilation -Target TVol 6-8cc/kgIBW -Target Plateau Pressure < 30cm H20 -Target driving pressure less than 15 cm of water -Target PaO2 55-65: titrate PEEP/FiO2 per protocol -Ventilator associated pneumonia prevention protocol  Abnormal CT scan of the chest showing bronchiectasis at the base -Patient was not symptomatic prior to fall -Bronchiectasis may be related to recurrent aspirations  Mitral valve replacement -Was on anticoagulation -Maintain off anticoagulation at present -Follow PT/INR  Goals of care discussions -Patient has a living well -Spoke with patient's spouse and daughter at bedside -Underlying poor quality of life with worsening -Will not want to be ventilator supported long-term -Will be appropriate to get palliative care involved  Best Practice (right click and "Reselect all SmartList Selections" daily)   Diet/type: NPO DVT prophylaxis: SCD GI prophylaxis: PPI Lines: N/A Foley:  N/A Code Status:  full code Last date of multidisciplinary goals of care discussion [Per primary]  Labs   CBC: Recent Labs  Lab 11/19/22 1107 11/19/22 1127 11/19/22 1222  WBC 3.9*  --   --   HGB 9.3* 9.9* 8.8*  HCT 29.4* 29.0* 26.0*  MCV 82.8  --   --   PLT 190  --   --     Basic Metabolic  Panel: Recent Labs  Lab 11/19/22 1107 11/19/22 1127 11/19/22 1222  NA 132* 134* 134*  K 3.8 3.8 3.5  CL 100 99  --   CO2 23  --   --   GLUCOSE 87 84  --   BUN 16 17  --   CREATININE 0.66 0.50*  --   CALCIUM 8.3*  --   --    GFR: Estimated Creatinine Clearance: 51.1 mL/min (A) (by C-G formula based on SCr of 0.5 mg/dL (L)). Recent Labs  Lab 11/19/22 1107 11/19/22 1123  WBC 3.9*  --   LATICACIDVEN  --  2.0*    Liver Function Tests: Recent Labs  Lab 11/19/22 1107  AST 13*  ALT 7  ALKPHOS 93  BILITOT 1.0  PROT 5.4*  ALBUMIN 3.1*   No results for input(s): "LIPASE", "AMYLASE" in the last 168 hours. No results for input(s): "AMMONIA" in the last 168 hours.  ABG    Component Value Date/Time   PHART 7.479 (H) 11/19/2022 1222   PCO2ART 30.8 (L) 11/19/2022 1222   PO2ART 489 (H) 11/19/2022 1222   HCO3 23.2 11/19/2022 1222   TCO2 24 11/19/2022 1222   O2SAT 100 11/19/2022 1222     Coagulation Profile: Recent Labs  Lab 11/19/22 1107  INR 4.0*    Cardiac Enzymes: No results for input(s): "CKTOTAL", "CKMB", "CKMBINDEX", "TROPONINI" in the last 168 hours.  HbA1C: No results found for: "HGBA1C"  CBG: No results for input(s): "GLUCAP" in the last 168 hours.  Review of Systems:   Unable to provide history  Past Medical History:  He,  has no past medical history on file.   Surgical History:  Valve replacement  Social History:      Family History:  His family history is not on file.   Allergies Allergies  Allergen Reactions   Iohexol Itching   Sulfa Antibiotics Other (See Comments)    unknown    The patient is critically ill with multiple organ systems failure and requires high complexity decision making for assessment and support, frequent evaluation and titration of therapies, application of advanced monitoring technologies and extensive interpretation of multiple databases. Critical Care Time devoted to patient care services described in this note  independent of APP/resident time (if applicable)  is 35 minutes.   Virl Diamond MD [facility] Personal pager: See Amion If unanswered, please page CCM On-call: #[phone]

## 2022-11-19 NOTE — Progress Notes (Signed)
   11/19/22 1142  Spiritual Encounters  Type of Visit Attempt (pt unavailable)  Care provided to: Pt not available  Conversation partners present during encounter Nurse  Reason for visit Trauma  OnCall Visit No   Responded to a level 2 trauma page, patient is a 82 y.o. male brought to the ED by EMS. Patient was found on the floor by family after suffering a fall at home.  Patient had a seizure in the ambulance on the way to the hospital.  Patient was unavailable. Chaplain advised no family available at the time, advised lobby to notify chaplain when and if family arrive.

## 2022-11-19 NOTE — ED Notes (Signed)
Got patient on the monitor patient has nurses and doctors at bedside

## 2022-11-19 NOTE — Consult Note (Signed)
  Chief Complaint   Chief Complaint  Patient presents with   Fall    History of Present Illness  Donald [name] is a 82 y.o. male brought to the emergency department via EMS after family found him on the floor after suffering a fall at home.  Patient apparently had a witnessed seizure in the ambulance en route to the hospital.  Upon his arrival there is some documentation that the patient had a GCS of 3 however also noted to be purposeful, grabbing at the ET tube after he was intubated.  Of note, the patient does have a history of mechanical mitral valve and is maintained on Coumadin.  He also has a history of Parkinson's disease.  He has an implanted pacemaker.  Past Medical History  As above  Past Surgical History  As above  Social History     Medications   Prior to Admission medications   Medication Sig Start Date End Date Taking? Authorizing Provider  atorvastatin (LIPITOR) 20 MG tablet Take 20 mg by mouth at bedtime. 08/26/22   [provider]  carbidopa-levodopa (SINEMET CR) 50-200 MG tablet Take 1 tablet by mouth at bedtime. 08/26/22   [provider]  carbidopa-levodopa (SINEMET IR) 25-100 MG tablet Take 2 tablets by mouth See admin instructions. 2 tablets six times a day at 7am, 9:30am, 12pm, 2:30pm, 5pm, and 7pm. 08/26/22   [provider]  esomeprazole (NEXIUM) 40 MG capsule Take 40 mg by mouth daily. 08/26/22   [provider]  levothyroxine (SYNTHROID) 125 MCG tablet Take 125 mcg by mouth every morning. 08/26/22   [provider]  midodrine (PROAMATINE) 10 MG tablet Take 5-10 mg by mouth See admin instructions. 5mg  in the morning, 10mg  at noon, and 10mg  in the evening 11/04/22   [provider]  rOPINIRole (REQUIP) 2 MG tablet Take 2 mg by mouth in the morning, at noon, and at bedtime. 08/26/22   [provider]  warfarin (COUMADIN) 5 MG tablet Take 2.5-5 mg by mouth See admin instructions. 2.5mg  daily on Sunday's,  Tuesday's, and Thursday's, 5mg  daily on Monday's, Wednesday's, Friday's, and Saturday's. 08/26/22   [provider]    Allergies   Allergies  Allergen Reactions   Iohexol Itching   Sulfa Antibiotics Other (See Comments)    unknown    Review of Systems  ROS  Neurologic Exam  Currently sedated on propofol No eye opening Breathing over vent Does move BUE/BLE spontaneously  Imaging  CT scan of the head was personally reviewed.  This demonstrates significant bilateral brain atrophy.  There is minuscule left sylvian subarachnoid hemorrhage.  No associated mass effect.  No midline shift.  There is no hydrocephalus.  Impression  - 82 y.o. male with multiple medical comorbidities including mechanical heart valve and Parkinson's disease, now status post fall with inconsequential left sylvian subarachnoid hemorrhage.  Suspect patient's depressed mental status related to posttraumatic seizure.  Plan  -Continue supportive care per PCCM -Will start Keppra 1000 mg followed by 500 twice daily -Patient's anticoagulation has already been reversed -Will order repeat CT scan of the head without contrast in 12 hours -If his mental status does not improve over the next several hours off propofol, would recommend long-term EEG.   I discussed the situation including imaging findings with the patient's wife and daughter at bedside.  I reviewed with her the plan above.  All their questions were answered.

## 2022-11-19 NOTE — Progress Notes (Signed)
Lab notified about Type & Screen to be drawn.

## 2022-11-19 NOTE — Progress Notes (Signed)
Patient transported from ED to 4N31 without incidence. AMBU present.

## 2022-11-19 NOTE — TOC CAGE-AID Note (Signed)
Transition of Care [facility]) - CAGE-AID Screening   Patient Details  Name: Donald [name] MRN: [id number] Date of Birth: 1940-06-28  Transition of Care [facility]) CM/SW Contact:    [name], RN Phone Number: [phone] 11/19/2022, 6:04 PM   Clinical Narrative: Pt here after sustaining a head injury due to falling.  Pt is intubated; therefore unable to complete screening.    CAGE-AID Screening: Substance Abuse Screening unable to be completed due to: : Patient unable to participate

## 2022-11-19 NOTE — ED Notes (Signed)
Patient temperature noted to be low, warm blankets applied per MD

## 2022-11-19 NOTE — ED Notes (Signed)
Patient received 2 L total of NS bolus.

## 2022-11-19 NOTE — ED Provider Notes (Signed)
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 does not show any acute fracture. [MB]  1228 Received a call from radiology that patient has subarachnoid likely traumatic along with some intraparenchymal and ventricular bleeding.  Reviewed with trauma and we are proceeding with reversal agents.  Pharmacy assisting. [MB]  1332 Discussed with neurosurgery Dr. Conchita Paris who said there is no operative intervention planned.  He would be by to consult on the patient put a note down. [MB]    Clinical Course User Index [MB] Terrilee Files, MD                             Medical Decision Making Amount and/or Complexity of Data Reviewed Labs: ordered. Radiology: ordered.  Risk Prescription drug management. Decision regarding hospitalization.   This patient complains of fall head trauma altered mental status; this involves an extensive number of treatment Options and is a complaint that carries with it a high risk of complications and morbidity. The differential includes bleed, fracture, contusion, airway compromise, seizure  I ordered, reviewed and interpreted labs, which included CBC  with low hemoglobin low white count normal platelets, chemistries with low sodium, lactate elevated, VBG with out significant acidosis, INR supratherapeutic I ordered medication reversal of Coumadin with Kcentra, IV sedation and reviewed PMP when indicated. I ordered imaging studies which included CT head cervical spine angio head and neck, CT chest abdomen pelvis, x-rays of chest and pelvis and I independently    visualized and interpreted imaging which showed subarachnoid and intraparenchymal hemorrhage, intraventricular hemorrhage Additional history obtained from EMS and patient's family Previous records obtained and reviewed in epic no recent admissions I consulted trauma Dr. Cliffton Asters, neurosurgery Dr. Conchita Paris, Dr. Wynona Neat critical care and discussed lab  and imaging findings and discussed disposition.  Cardiac monitoring reviewed, sinus bradycardia Social determinants considered, no significant barriers Critical Interventions: Intubation for airway compromise, critical care trauma workup requiring transfusion of packed red blood cells, management of hypotension and hypertension  After the interventions stated above, I reevaluated the patient and found patient to be critically ill, airway protected not actively seizing Admission and further testing considered, will need admission to the hospital for further management.  Wife updates me that he is a DNR.  She understands we will give him a period of time to see if his injury is recoverable.  She is in agreement with current plan.         Final Clinical Impression(s) / ED Diagnoses Final diagnoses:  Traumatic subarachnoid hemorrhage with loss of consciousness of 30 minutes or less, initial encounter [facility])  Intraparenchymal hemorrhage of brain (HCC)  Fall, initial encounter    Rx / DC Orders ED Discharge Orders     None         Terrilee Files, MD 11/19/22 [phone]

## 2022-11-19 NOTE — Progress Notes (Signed)
eLink Physician-Brief Progress Note Patient Name: Donald [name] DOB: 12/11/1940 MRN: [id number]   Date of Service  11/19/2022  HPI/Events of Note  Patient's family  (wife and daughters) who are in the room are requesting a DNR order, per his daughter he has a Living Will that specifies DNR status. They are okay with intubation for now.  eICU Interventions  DNR order entered.        Migdalia Dk 11/19/2022, 8:44 PM

## 2022-11-19 NOTE — Consult Note (Signed)
     Donald [name] 22-Jun-1940  [id number].    Requesting MD: Charm Barges, MD Chief Complaint/Reason for Consult: fall   HPI:  Donald [name] is an 82 y/o M who presented via EMS as a level 1 trauma after a GLF at home. History reported by EDP and bedside staff as patient is intubated upon my arrival. Per report his wife heard a thud and then found him face down on the floor with a head laceration. She called EMS and en route to the hospital he had a seizure and was given 2.5 of versed, after which time he became unresponsive and was manually ventilated in the field. Upon arrival to the ED he remained GCS 3 and the decision was made to intubate. Reportedly he did make purposeful movement - grabbing towards ETT. Systolic pressure initial 80's, came up to 98 mmhg with fluids.    ROS: Review of Systems  Unable to perform ROS: Intubated    No family history on file.  No past medical history on file.    Social History:  has no history on file for tobacco use, alcohol use, and drug use.  Allergies:  Allergies  Allergen Reactions   Iohexol Itching   Sulfa Antibiotics Other (See Comments)    unknown    (Not in a hospital admission)    Physical Exam: Blood pressure 98/61, pulse (!) 50, resp. rate 15, SpO2 100 %. General: acute and chronically ill appearing elderly male, intubated and sedated HEENT: head traumatic, 3.5 cm laceration lateral to left eyebrow. Pupils equal 2 mm, sluggish but reactive  Neck- Trachea is midline, c-collar in place  CV- bradycardic, mechanical valve  Pulm- ventilated respirations Abd- scaphoid, not rigid, no ecchymosis  MSK- UE/LE symmetrical, no edema, ecchymosis RUE, elbow pads and knee pads on  Neuro- purposeful movement noted in CT scanner, trying to sit up Psych- unable to assess Skin: warm and dry, no rashes or lesions   No results found for this or any previous visit (from the past 48 hour(s)). No results found.   Assessment/Plan 82 y/o M with  hisory of mitral valve on warfarin who presents from home after GLF.   He has a head laceration with significant bleeding. I placed staples on this and applied a pressure dressing.  Systolic pressures have been 81-105 mmhg. He has received 1 u pRBC. He takes midodrine at home so I suspect his baseline BP is soft.  CT scans are pending. Pharmacy is getting coumadin reversal agent prepared.   Dr. Cliffton Asters to follow up trauma scan results and make further recommendations.   I reviewed nursing notes, EDP notes, last 24 h vitals and pain scores, last 48 h intake and output, last 24 h labs and trends, and last 24 h imaging results.  Adam Phenix, PA-C [facility] 11/19/2022, 11:30 AM Please see Amion for pager number during day hours 7:00am-4:30pm or 7:00am -11:30am on weekends

## 2022-11-19 NOTE — ED Notes (Addendum)
C collar removed per provider, cleared by CT scans.

## 2022-11-19 NOTE — Progress Notes (Signed)
eLink Physician-Brief Progress Note Patient Name: YNES WOLTERING DOB: 04/01/1941 MRN: [id number]   Date of Service  11/19/2022  HPI/Events of Note  Patient admitted with traumatic ICH, seizures, altered  mental status, and acute respiratory failure requiring intubation and mechanical ventilation.  eICU Interventions  New Patient Evaluation.        Thomasene Lot Shykeria Sakamoto 11/19/2022, 7:55 PM

## 2022-11-19 NOTE — Progress Notes (Signed)
Pt transported from ED 12 to CT and back to ED 12 with RN at bedside and no complications.

## 2022-11-19 NOTE — Progress Notes (Signed)
Orthopedic Tech Progress Note Patient Details:  Donald [name] 01-06-1941 [id number] Level 1 Trauma. Not needed Patient ID: KAYGE BOURGEOIS, male   DOB: 11/27/1940, 82 y.o.   MRN: [id number]  Lovett Calender 11/19/2022, 12:51 PM

## 2022-11-19 NOTE — ED Notes (Signed)
Trauma Response Nurse Documentation   Donald [name] is a 82 y.o. male arriving to [facility] ED via EMS  On warfarin daily. Trauma was activated as a Level 1 by ED Charge RN based on the following trauma criteria GCS < 9.  Patient cleared for CT by Dr. Cliffton Asters. Pt transported to CT with trauma response nurse present to monitor. RN remained with the patient throughout their absence from the department for clinical observation.   GCS 3 initially.  History   No past medical history on file.       Initial Focused Assessment (If applicable, or please see trauma documentation): - Airway compromised - GCS 3 initially then a 5-6 after intubation - PERRLA 2's sluggish - c-collar in place - x2 PIVs  CT's Completed:   CT Head, CT C-Spine, CT Chest w/ contrast, and CT abdomen/pelvis w/ contrast   Interventions:  - Intubated with etomidate  - Fentanyl gtt initiated  - propofol gtt initiated  - ancef given - 1U PRBC given - Kcentra given - Vitamin K given - trauma labs drawn - CXR - Pelvic XR - CT pan scan - OG tube placed   Plan for disposition:  Admission to ICU   Consults completed:  Neurosurgeon at 1432 Dr Conchita Paris.  Event Summary: BIB [facility] after his wife heard him fall and found him prone on his left side.  Pt was unresponsive and had a witnessed seizure with EMS.  Versed given en route.  Pt is on coumadin for a mechanical mitral valve.  Pt has a head bleed and is getting reversal.   Bedside handoff with ED RN [name].    [name]  Trauma Response RN  Please call TRN at [phone] for further assistance.

## 2022-11-19 NOTE — Progress Notes (Signed)
eLink Physician-Brief Progress Note Patient Name: Donald [name] DOB: Nov 29, 1940 MRN: [id number]   Date of Service  11/19/2022  HPI/Events of Note  BP 80/54, MAP 64, HR 50.  eICU Interventions  Peripheral Levo gtt ordered.        Thomasene Lot Annalyn Blecher 11/19/2022, 10:17 PM

## 2022-11-20 ENCOUNTER — Inpatient Hospital Stay (HOSPITAL_COMMUNITY): Payer: PPO

## 2022-11-20 LAB — BASIC METABOLIC PANEL
Anion gap: 9 (ref 5–15)
BUN: 11 mg/dL (ref 8–23)
CO2: 23 mmol/L (ref 22–32)
Calcium: 8.5 mg/dL — ABNORMAL LOW (ref 8.9–10.3)
Chloride: 104 mmol/L (ref 98–111)
Creatinine, Ser: 0.56 mg/dL — ABNORMAL LOW (ref 0.61–1.24)
GFR, Estimated: >60 mL/min (ref >60)
Glucose, Bld: 86 mg/dL (ref 70–99)
Potassium: 3.2 mmol/L — ABNORMAL LOW (ref 3.5–5.1)
Sodium: 136 mmol/L (ref 135–145)

## 2022-11-20 LAB — CBC
HCT: 34.1 % — ABNORMAL LOW (ref 39.0–52.0)
Hemoglobin: 11.1 g/dL — ABNORMAL LOW (ref 13.0–17.0)
MCH: 26.4 pg (ref 26.0–34.0)
MCHC: 32.6 g/dL (ref 30.0–36.0)
MCV: 81.2 fL (ref 80.0–100.0)
Platelets: 199 10*3/uL (ref 150–400)
RBC: 4.2 MIL/uL — ABNORMAL LOW (ref 4.22–5.81)
RDW: 16.3 % — ABNORMAL HIGH (ref 11.5–15.5)
WBC: 7.8 10*3/uL (ref 4.0–10.5)
nRBC: 0 % (ref 0.0–0.2)

## 2022-11-20 LAB — BPAM RBC
ISSUE DATE / TIME: 202407041145
Unit Type and Rh: 5100

## 2022-11-20 LAB — PROTIME-INR
INR: 1.6 — ABNORMAL HIGH (ref 0.8–1.2)
Prothrombin Time: 18.8 seconds — ABNORMAL HIGH (ref 11.4–15.2)

## 2022-11-20 LAB — TYPE AND SCREEN

## 2022-11-20 LAB — POCT I-STAT 7, (LYTES, BLD GAS, ICA,H+H)
Acid-Base Excess: 0 mmol/L (ref 0.0–2.0)
Bicarbonate: 23.3 mmol/L (ref 20.0–28.0)
Calcium, Ion: 1.19 mmol/L (ref 1.15–1.40)
HCT: 34 % — ABNORMAL LOW (ref 39.0–52.0)
Hemoglobin: 11.6 g/dL — ABNORMAL LOW (ref 13.0–17.0)
O2 Saturation: 100 %
Patient temperature: 36.6
Potassium: 3.3 mmol/L — ABNORMAL LOW (ref 3.5–5.1)
Sodium: 138 mmol/L (ref 135–145)
TCO2: 24 mmol/L (ref 22–32)
pCO2 arterial: 31.4 mmHg — ABNORMAL LOW (ref 32–48)
pH, Arterial: 7.477 — ABNORMAL HIGH (ref 7.35–7.45)
pO2, Arterial: 164 mmHg — ABNORMAL HIGH (ref 83–108)

## 2022-11-20 LAB — TRIGLYCERIDES: Triglycerides: 259 mg/dL — ABNORMAL HIGH (ref <150)

## 2022-11-20 LAB — GLUCOSE, CAPILLARY: Glucose-Capillary: 97 mg/dL (ref 70–99)

## 2022-11-20 LAB — MAGNESIUM: Magnesium: 1.6 mg/dL — ABNORMAL LOW (ref 1.7–2.4)

## 2022-11-20 MED ORDER — CHLORHEXIDINE GLUCONATE CLOTH 2 % EX PADS
6.0000 | MEDICATED_PAD | Freq: Every day | CUTANEOUS | Status: DC
  Administered 2022-11-20 – 2022-11-23 (×5): 6 via TOPICAL

## 2022-11-20 MED ORDER — ORAL CARE MOUTH RINSE: 15.0000 mL | OROMUCOSAL | Status: DC | PRN

## 2022-11-20 MED ORDER — MORPHINE SULFATE (PF) 2 MG/ML IV SOLN
1.0000 mg | INTRAVENOUS | Status: DC | PRN
  Administered 2022-11-24: 2 mg via INTRAVENOUS
  Administered 2022-11-24: 1 mg via INTRAVENOUS
  Filled 2022-11-20 (×2): qty 1

## 2022-11-20 MED ORDER — POTASSIUM CHLORIDE 20 MEQ PO PACK
20.0000 meq | PACK | ORAL | Status: AC
  Administered 2022-11-20 (×2): 20 meq
  Filled 2022-11-20 (×2): qty 1

## 2022-11-20 MED ORDER — ORAL CARE MOUTH RINSE
15.0000 mL | OROMUCOSAL | Status: DC
  Administered 2022-11-21 (×11): 15 mL via OROMUCOSAL

## 2022-11-20 MED ORDER — MORPHINE SULFATE (PF) 2 MG/ML IV SOLN: 2.0000 mg | INTRAVENOUS | Status: DC | PRN

## 2022-11-20 MED ORDER — OXYCODONE HCL 5 MG PO TABS
2.5000 mg | ORAL_TABLET | ORAL | Status: DC | PRN
  Administered 2022-11-20: 2.5 mg
  Administered 2022-11-21 – 2022-11-22 (×4): 5 mg
  Filled 2022-11-20 (×5): qty 1

## 2022-11-20 MED ORDER — IPRATROPIUM-ALBUTEROL 0.5-2.5 (3) MG/3ML IN SOLN
RESPIRATORY_TRACT | Status: AC
  Administered 2022-11-20: 3 mL
  Filled 2022-11-20: qty 3

## 2022-11-20 MED ORDER — MIDAZOLAM HCL 2 MG/2ML IJ SOLN
1.0000 mg | INTRAMUSCULAR | Status: DC | PRN
  Administered 2022-11-20 – 2022-11-21 (×4): 1 mg via INTRAVENOUS
  Filled 2022-11-20 (×5): qty 2

## 2022-11-20 MED ORDER — IPRATROPIUM-ALBUTEROL 0.5-2.5 (3) MG/3ML IN SOLN: 3.0000 mL | RESPIRATORY_TRACT | Status: DC

## 2022-11-20 MED ORDER — POTASSIUM CHLORIDE 10 MEQ/100ML IV SOLN
10.0000 meq | INTRAVENOUS | Status: AC
  Administered 2022-11-20 (×4): 10 meq via INTRAVENOUS
  Filled 2022-11-20 (×4): qty 100

## 2022-11-20 MED ORDER — MAGNESIUM SULFATE 4 GM/100ML IV SOLN
4.0000 g | Freq: Once | INTRAVENOUS | Status: AC
  Administered 2022-11-20: 4 g via INTRAVENOUS
  Filled 2022-11-20 (×2): qty 100

## 2022-11-20 MED ORDER — IPRATROPIUM-ALBUTEROL 0.5-2.5 (3) MG/3ML IN SOLN: 3.0000 mL | RESPIRATORY_TRACT | Status: DC | PRN

## 2022-11-20 NOTE — Plan of Care (Signed)
  Problem: Clinical Measurements: Goal: Ability to maintain clinical measurements within normal limits will improve Outcome: Progressing Goal: Will remain free from infection Outcome: Progressing Goal: Diagnostic test results will improve Outcome: Progressing Goal: Respiratory complications will improve Outcome: Progressing   Problem: Activity: Goal: Risk for activity intolerance will decrease Outcome: Progressing   Problem: Coping: Goal: Level of anxiety will decrease Outcome: Progressing   Problem: Elimination: Goal: Will not experience complications related to urinary retention Outcome: Progressing   Problem: Pain Managment: Goal: General experience of comfort will improve Outcome: Progressing   Problem: Safety: Goal: Ability to remain free from injury will improve Outcome: Progressing   Problem: Skin Integrity: Goal: Risk for impaired skin integrity will decrease Outcome: Progressing   Problem: Education: Goal: Knowledge of General Education information will improve Description: Including pain rating scale, medication(s)/side effects and non-pharmacologic comfort measures Outcome: Not Progressing   Problem: Health Behavior/Discharge Planning: Goal: Ability to manage health-related needs will improve Outcome: Not Progressing   Problem: Clinical Measurements: Goal: Cardiovascular complication will be avoided Outcome: Not Progressing   Problem: Nutrition: Goal: Adequate nutrition will be maintained Outcome: Not Progressing   Problem: Elimination: Goal: Will not experience complications related to bowel motility Outcome: Not Progressing

## 2022-11-20 NOTE — Progress Notes (Signed)
This RN d/c'ed ultrasound guided PIV in right forearm. IV was occluded and leaking. Put in IV team consult for new PIV, however IV team nurse stated that she was unable to find any viable veins for use via ultrasound. This RN placed IV watch sensor on existing PIV to run levo gtt. Site has no redness and flushes appropriately. Will continue to monitor site.

## 2022-11-20 NOTE — Progress Notes (Signed)
  NEUROSURGERY PROGRESS NOTE   No issues overnight.   EXAM:  BP 107/73   Pulse 84   Temp 98.6 F (37 C) (Axillary)   Resp 16   Ht 5\' 9"  (1.753 m)   Wt 51.9 kg   SpO2 100%   BMI 16.90 kg/m   Still on propofol: No eye opening to pain Not following commands Minimal spontaneous movements BUE/BLE  IMAGING: CTH reviewed demonstrating progression of ICH including development of right incisural hematoma and some small amount of IVH in addition to previously seen left Sylvian SAH. No HCP.  IMPRESSION:  82 y.o. male s/p fall on coumadin for mech valve and baseline Parkinson's dementia with TBI. Unclear why mental status remains quite depressed, considering subclinical SZ vs. More significant shearing-type TBI not completely delineated on CT.  PLAN: - I have d/c'ed propofol this am - If no improvement in mental status this am, will order LTM EEG - Cont Keppra   Lisbeth Renshaw, MD [facility]

## 2022-11-20 NOTE — Progress Notes (Signed)
PCCM Update:  Case discussed with Dr. Bedelia Person, PCCM will sign off. Please call if our team is needed.  Melody Comas, MD [facility] Office: [phone]   See Amion for personal pager PCCM on call pager [phone] until 7pm. Please call Elink 7p-7a. [phone]

## 2022-11-20 NOTE — TOC Initial Note (Signed)
Transition of Care [facility]) - Initial/Assessment Note    Patient Details  Name: Donald [name] MRN: [id number] Date of Birth: 1941/01/12  Transition of Care [facility]) CM/SW Contact:    Mearl Latin, LCSW Phone Number: 11/20/2022, 5:26 PM  Clinical Narrative:                 Patient admitted from home and is currently intubated. TOC will continue to monitor patient for needs.     Barriers to Discharge: Continued Medical Work up   Patient Goals and CMS Choice            Expected Discharge Plan and Services       Living arrangements for the past 2 months: Single Family Home                                      Prior Living Arrangements/Services Living arrangements for the past 2 months: Single Family Home Lives with:: Spouse Patient language and need for interpreter reviewed:: Yes        Need for Family Participation in Patient Care: Yes (Comment) Care giver support system in place?: Yes (comment)   Criminal Activity/Legal Involvement Pertinent to Current Situation/Hospitalization: No - Comment as needed  Activities of Daily Living      Permission Sought/Granted                  Emotional Assessment   Attitude/Demeanor/Rapport: Unable to Assess Affect (typically observed): Unable to Assess Orientation: :  (Intubated) Alcohol / Substance Use: Not Applicable Psych Involvement: No (comment)  Admission diagnosis:  Fall, initial encounter [W19.XXXA] Patient Active Problem List   Diagnosis Date Noted   Fall, initial encounter 11/19/2022   PCP:  Gaspar Garbe, MD Pharmacy:   [facility] [phone] - [place], [place] - [address] [place] [address] Phone: [phone] Fax: [phone]     Social Determinants of Health (SDOH) Social History:   SDOH Interventions:     Readmission Risk Interventions     No data to display

## 2022-11-20 NOTE — Progress Notes (Signed)
eLink Physician-Brief Progress Note Patient Name: Donald [name] DOB: 1941-01-09 MRN: [id number]   Date of Service  11/20/2022  HPI/Events of Note  ABG reviewed.  eICU Interventions  RR reduced to 16        Genni Buske U Mackinley Cassaday 11/20/2022, 5:52 AM

## 2022-11-20 NOTE — Progress Notes (Signed)
Pt transported from 4N31 to CT and back without complications. Pt suctioned before transport. AMBU present.

## 2022-11-20 NOTE — Progress Notes (Addendum)
Trauma/Critical Care Follow Up Note  Subjective:    Overnight Issues:   Objective:  Vital signs for last 24 hours: Temp:  [95.4 F (35.2 C)-98.6 F (37 C)] 98.6 F (37 C) (07/05 0800) Pulse Rate:  [49-153] 74 (07/05 1144) Resp:  [13-20] 15 (07/05 1144) BP: (59-175)/(49-92) 103/56 (07/05 1144) SpO2:  [97 %-100 %] 100 % (07/05 1144) FiO2 (%):  [40 %] 40 % (07/05 1144) Weight:  [49.9 kg-51.9 kg] 51.9 kg (07/04 1900)  Hemodynamic parameters for last 24 hours:    Intake/Output from previous day: 07/04 0701 - 07/05 0700 In: 1946.7 [I.V.:1640; IV Piggyback:306.7] Out: 2550 [Urine:2550]  Intake/Output this shift: Total I/O In: 151.4 [I.V.:151.4] Out: 575 [Urine:575]  Vent settings for last 24 hours: Vent Mode: PRVC FiO2 (%):  [40 %] 40 % Set Rate:  [16 bmp-18 bmp] 16 bmp Vt Set:  [600 mL] 600 mL PEEP:  [5 cmH20] 5 cmH20 Plateau Pressure:  [1 cmH20-18 cmH20] 15 cmH20  Physical Exam:  Gen: comfortable, no distress Neuro: not following commands HEENT: PERRL Neck: supple CV: RRR Pulm: unlabored breathing on mechanical ventilation Abd: soft, NT    GU: urine clear and yellow, +Foley Extr: wwp, no edema  Results for orders placed or performed during the hospital encounter of 11/19/22 (from the past 24 hour(s))  I-Stat arterial blood gas, ED     Status: Abnormal   Collection Time: 11/19/22 12:22 PM  Result Value Ref Range   pH, Arterial 7.479 (H) 7.35 - 7.45   pCO2 arterial 30.8 (L) 32 - 48 mmHg   pO2, Arterial 489 (H) 83 - 108 mmHg   Bicarbonate 23.2 20.0 - 28.0 mmol/L   TCO2 24 22 - 32 mmol/L   O2 Saturation 100 %   Acid-Base Excess 0.0 0.0 - 2.0 mmol/L   Sodium 134 (L) 135 - 145 mmol/L   Potassium 3.5 3.5 - 5.1 mmol/L   Calcium, Ion 1.12 (L) 1.15 - 1.40 mmol/L   HCT 26.0 (L) 39.0 - 52.0 %   Hemoglobin 8.8 (L) 13.0 - 17.0 g/dL   Patient temperature 16.1 C    Collection site RADIAL, ALLEN'S TEST ACCEPTABLE    Drawn by RT    Sample type ARTERIAL   Prepare RBC      Status: None   Collection Time: 11/19/22 12:31 PM  Result Value Ref Range   Order Confirmation      ORDER PROCESSED BY BLOOD BANK Performed at [facility], [address] [place], [place] [address]   Urinalysis, Routine w reflex microscopic -Urine, Catheterized     Status: Abnormal   Collection Time: 11/19/22  1:43 PM  Result Value Ref Range   Color, Urine YELLOW YELLOW   APPearance CLEAR CLEAR   Specific Gravity, Urine 1.010 1.005 - 1.030   pH 6.0 5.0 - 8.0   Glucose, UA NEGATIVE NEGATIVE mg/dL   Hgb urine dipstick SMALL (A) NEGATIVE   Bilirubin Urine NEGATIVE NEGATIVE   Ketones, ur NEGATIVE NEGATIVE mg/dL   Protein, ur NEGATIVE NEGATIVE mg/dL   Nitrite NEGATIVE NEGATIVE   Leukocytes,Ua NEGATIVE NEGATIVE  Urinalysis, Microscopic (reflex)     Status: Abnormal   Collection Time: 11/19/22  1:43 PM  Result Value Ref Range   RBC / HPF 6-10 0 - 5 RBC/hpf   WBC, UA 6-10 0 - 5 WBC/hpf   Bacteria, UA RARE (A) NONE SEEN   Squamous Epithelial / HPF NONE SEEN 0 - 5 /HPF   Hyaline Casts, UA PRESENT  Protime-INR     Status: Abnormal   Collection Time: 11/19/22  2:30 PM  Result Value Ref Range   Prothrombin Time 19.0 (H) 11.4 - 15.2 seconds   INR 1.6 (H) 0.8 - 1.2  MRSA Next Gen by PCR, Nasal     Status: None   Collection Time: 11/19/22  7:04 PM   Specimen: Nasal Mucosa; Nasal Swab  Result Value Ref Range   MRSA by PCR Next Gen NOT DETECTED NOT DETECTED  ABO/Rh     Status: None   Collection Time: 11/19/22  8:23 PM  Result Value Ref Range   ABO/RH(D)      A NEG Performed at [facility], [address], [place], [place] [address]   Glucose, capillary     Status: Abnormal   Collection Time: 11/19/22  9:35 PM  Result Value Ref Range   Glucose-Capillary 68 (L) 70 - 99 mg/dL  Glucose, capillary     Status: None   Collection Time: 11/19/22 10:08 PM  Result Value Ref Range   Glucose-Capillary 92 70 - 99 mg/dL  I-STAT 7, (LYTES, BLD GAS, ICA, H+H)     Status:  Abnormal   Collection Time: 11/20/22  5:21 AM  Result Value Ref Range   pH, Arterial 7.477 (H) 7.35 - 7.45   pCO2 arterial 31.4 (L) 32 - 48 mmHg   pO2, Arterial 164 (H) 83 - 108 mmHg   Bicarbonate 23.3 20.0 - 28.0 mmol/L   TCO2 24 22 - 32 mmol/L   O2 Saturation 100 %   Acid-Base Excess 0.0 0.0 - 2.0 mmol/L   Sodium 138 135 - 145 mmol/L   Potassium 3.3 (L) 3.5 - 5.1 mmol/L   Calcium, Ion 1.19 1.15 - 1.40 mmol/L   HCT 34.0 (L) 39.0 - 52.0 %   Hemoglobin 11.6 (L) 13.0 - 17.0 g/dL   Patient temperature 65.7 C    Collection site RADIAL, ALLEN'S TEST ACCEPTABLE    Drawn by RT    Sample type ARTERIAL   Protime-INR     Status: Abnormal   Collection Time: 11/20/22  7:25 AM  Result Value Ref Range   Prothrombin Time 18.8 (H) 11.4 - 15.2 seconds   INR 1.6 (H) 0.8 - 1.2  CBC     Status: Abnormal   Collection Time: 11/20/22  7:25 AM  Result Value Ref Range   WBC 7.8 4.0 - 10.5 K/uL   RBC 4.20 (L) 4.22 - 5.81 MIL/uL   Hemoglobin 11.1 (L) 13.0 - 17.0 g/dL   HCT 84.6 (L) 96.2 - 95.2 %   MCV 81.2 80.0 - 100.0 fL   MCH 26.4 26.0 - 34.0 pg   MCHC 32.6 30.0 - 36.0 g/dL   RDW 84.1 (H) 32.4 - 40.1 %   Platelets 199 150 - 400 K/uL   nRBC 0.0 0.0 - 0.2 %  Basic metabolic panel     Status: Abnormal   Collection Time: 11/20/22  7:25 AM  Result Value Ref Range   Sodium 136 135 - 145 mmol/L   Potassium 3.2 (L) 3.5 - 5.1 mmol/L   Chloride 104 98 - 111 mmol/L   CO2 23 22 - 32 mmol/L   Glucose, Bld 86 70 - 99 mg/dL   BUN 11 8 - 23 mg/dL   Creatinine, Ser 0.27 (L) 0.61 - 1.24 mg/dL   Calcium 8.5 (L) 8.9 - 10.3 mg/dL   GFR, Estimated >25 >36 mL/min   Anion gap 9 5 - 15  Triglycerides  Status: Abnormal   Collection Time: 11/20/22  7:25 AM  Result Value Ref Range   Triglycerides 259 (H) <150 mg/dL  Glucose, capillary     Status: None   Collection Time: 11/20/22  8:13 AM  Result Value Ref Range   Glucose-Capillary 97 70 - 99 mg/dL  Magnesium     Status: Abnormal   Collection Time: 11/20/22   8:19 AM  Result Value Ref Range   Magnesium 1.6 (L) 1.7 - 2.4 mg/dL  BLOOD TRANSFUSION REPORT - SCANNED     Status: None   Collection Time: 11/20/22  9:50 AM   Narrative   Ordered by an unspecified provider.    Assessment & Plan: The plan of care was discussed with the bedside nurse for the day, who is in agreement with this plan and no additional concerns were raised.   Present on Admission:  Fall, initial encounter    LOS: 1 day   Additional comments:I reviewed the patient's new clinical lab test results.   and I reviewed the patients new imaging test results.    GLF  IVH, SAH, IPH - NSGY c/s, Dr. Conchita Paris, blossoming IPH on repeat head CT this AM, keppra x7d for sz ppx. Consider EEG if neuro exam does not improve, but slightly better now that sedation is off Age indeterminate T7 SP fx - pain control Baseline dementia and Parkinsons - home meds restarted  FEN - tube feeding DVT - SCDs, hold chemical ppx due to bleeding concerns Dispo - ICU   Clinical update provided to patient's wife and two daughters. They confirm DNR code status. They are not in favor of trach/PEG and will consider either one-way extubation if clinical improvement vs compassionate extubation if not.   Critical Care Total Time: 80 minutes  Diamantina Monks, MD Trauma & General Surgery Please use [email or web address] to contact on call provider  11/20/2022  *Care during the described time interval was provided by me. I have reviewed this patient's available data, including medical history, events of note, physical examination and test results as part of my evaluation.

## 2022-11-20 NOTE — Progress Notes (Signed)
eLink Physician-Brief Progress Note Patient Name: Donald [name] DOB: 02/27/1941 MRN: [id number]   Date of Service  11/20/2022  HPI/Events of Note  Per RN patient with bilateral wheezes.  eICU Interventions  PRN Duonebs ordered.        Thomasene Lot Christien Frankl 11/20/2022, 12:47 AM

## 2022-11-21 ENCOUNTER — Inpatient Hospital Stay (HOSPITAL_COMMUNITY): Payer: PPO

## 2022-11-21 LAB — PROTIME-INR
INR: 1.7 — ABNORMAL HIGH (ref 0.8–1.2)
Prothrombin Time: 19.7 seconds — ABNORMAL HIGH (ref 11.4–15.2)

## 2022-11-21 MED ORDER — ORAL CARE MOUTH RINSE
15.0000 mL | OROMUCOSAL | Status: DC
  Administered 2022-11-22 – 2022-11-24 (×7): 15 mL via OROMUCOSAL

## 2022-11-21 MED ORDER — ACETAMINOPHEN 10 MG/ML IV SOLN
1000.0000 mg | Freq: Four times a day (QID) | INTRAVENOUS | Status: AC
  Administered 2022-11-21 – 2022-11-22 (×3): 1000 mg via INTRAVENOUS
  Filled 2022-11-21 (×3): qty 100

## 2022-11-21 MED ORDER — HEPARIN SODIUM (PORCINE) 5000 UNIT/ML IJ SOLN
5000.0000 [IU] | Freq: Three times a day (TID) | INTRAMUSCULAR | Status: DC
  Administered 2022-11-21: 5000 [IU] via SUBCUTANEOUS
  Filled 2022-11-21: qty 1

## 2022-11-21 MED ORDER — HEPARIN (PORCINE) 25000 UT/250ML-% IV SOLN
1450.0000 [IU]/h | INTRAVENOUS | Status: DC
  Administered 2022-11-21: 600 [IU]/h via INTRAVENOUS
  Administered 2022-11-23: 1050 [IU]/h via INTRAVENOUS
  Administered 2022-11-24: 1350 [IU]/h via INTRAVENOUS
  Filled 2022-11-21 (×3): qty 250

## 2022-11-21 MED ORDER — MIDAZOLAM HCL 2 MG/2ML IJ SOLN: 0.5000 mg | INTRAMUSCULAR | Status: DC | PRN

## 2022-11-21 MED ORDER — ORAL CARE MOUTH RINSE: 15.0000 mL | OROMUCOSAL | Status: DC | PRN

## 2022-11-21 NOTE — Progress Notes (Signed)
SLP Cancellation Note  Patient Details Name: Donald [name] MRN: [id number] DOB: January 02, 1941   Cancelled treatment:       Reason Eval/Treat Not Completed: Medical issues which prohibited therapy;Patient's level of consciousness. Per RN, poor pt mentation prohibiting readiness for clinical swallow evaluation. Will continue to closely monitor.   Ardyth Gal MA, CCC-SLP Acute Rehabilitation Services   11/21/2022, 2:11 PM

## 2022-11-21 NOTE — Progress Notes (Signed)
   Trauma/Critical Care Follow Up Note  Subjective:    Overnight Issues:   Objective:  Vital signs for last 24 hours: Temp:  [97.7 F (36.5 C)-98.6 F (37 C)] 97.7 F (36.5 C) (07/06 0820) Pulse Rate:  [57-142] 81 (07/06 1109) Resp:  [11-25] 15 (07/06 1109) BP: (96-150)/(56-94) 143/94 (07/06 1000) SpO2:  [96 %-100 %] 100 % (07/06 1109) FiO2 (%):  [40 %] 40 % (07/06 1109)  Hemodynamic parameters for last 24 hours:    Intake/Output from previous day: 07/05 0701 - 07/06 0700 In: 3861.8 [I.V.:3106.9; IV Piggyback:754.9] Out: 1625 [Urine:1625]  Intake/Output this shift: No intake/output data recorded.  Vent settings for last 24 hours: Vent Mode: PSV;CPAP FiO2 (%):  [40 %] 40 % Set Rate:  [16 bmp] 16 bmp Vt Set:  [600 mL] 600 mL PEEP:  [5 cmH20] 5 cmH20 Pressure Support:  [8 cmH20] 8 cmH20 Plateau Pressure:  [12 cmH20-15 cmH20] 12 cmH20  Physical Exam:  Gen: comfortable, no distress Neuro: not following commands HEENT: PERRL Neck: supple CV: RRR Pulm: unlabored breathing on mechanical ventilation Abd: soft, NT    GU: urine clear and yellow, +Foley Extr: wwp, no edema  Results for orders placed or performed during the hospital encounter of 11/19/22 (from the past 24 hour(s))  Protime-INR     Status: Abnormal   Collection Time: 11/21/22  2:26 AM  Result Value Ref Range   Prothrombin Time 19.7 (H) 11.4 - 15.2 seconds   INR 1.7 (H) 0.8 - 1.2    Assessment & Plan: The plan of care was discussed with the bedside nurse for the day, who is in agreement with this plan and no additional concerns were raised.   Present on Admission:  Fall, initial encounter    LOS: 2 days   Additional comments:I reviewed the patient's new clinical lab test results.   and I reviewed the patients new imaging test results.    GLF   IVH, SAH, IPH - NSGY c/s, Dr. Conchita Paris, blossoming IPH on repeat head CT this AM, keppra x7d for sz ppx.  Age indeterminate T7 SP fx - pain  control Baseline dementia and Parkinsons - home meds restarted  FEN - tube feeding DVT - SCDs, okay for heparin gtt per NSGY with repeat head CT, but will hold off on AC in light of planned one-way extubation, reassess later today Dispo - ICU    Clinical update provided to wife and daughter at bedside. Discussed clinical improvement and current respiratory state as well as difficulty identifying etiology for not f/c-hearing loss vs TBI. Plan for one way extubation after remainder of family has come to visit and if decline, transition to comfort care.   Critical Care Total Time: 60 minutes  Diamantina Monks, MD Trauma & General Surgery Please use [email or web address] to contact on call provider  11/21/2022  *Care during the described time interval was provided by me. I have reviewed this patient's available data, including medical history, events of note, physical examination and test results as part of my evaluation.

## 2022-11-21 NOTE — Progress Notes (Signed)
   Providing Compassionate, Quality Care - Together  NEUROSURGERY PROGRESS NOTE   S: No issues overnight.   O: EXAM:  BP (!) 150/76   Pulse 78   Temp 97.7 F (36.5 C)   Resp 20   Ht 5\' 9"  (1.753 m)   Wt 51.9 kg   SpO2 100%   BMI 16.90 kg/m   Intubated, eyes closed PERRL Moves all extremities, does not follow commands, minimal right upper extremity movement On minimal vent settings  ASSESSMENT:  82 y.o. male with   Small ICH Mechanical heart valve  PLAN: -Family at bedside, answered their questions. -Family would like to proceed with possible extubation per Dr. Bedelia Person and would not like reintubation -If tolerates extubation, may ultimately need IV heparin due to mechanical valve.  Will need stable CT prior -No acute neurosurgical intervention -Okay for DVT prophylaxis -Guarded prognosis    Thank you for allowing me to participate in this patient's care.  Please do not hesitate to call with questions or concerns.   Monia Pouch, DO Neurosurgeon [facility] Cell: [phone]

## 2022-11-21 NOTE — Progress Notes (Signed)
Started heparin gtt for mechanical valve. No bolus dosing. Repeat head CT when drip therapeutic.   Diamantina Monks, MD General and Trauma Surgery [facility] Surgery

## 2022-11-21 NOTE — Progress Notes (Signed)
Extubated, doing okay, good cough. NGT placement unsuccessful, will attempt one more time. Family declines further attempt at NG if next attempt unsuccessful. PT/OT/SLP ordered.   Diamantina Monks, MD General and Trauma Surgery [facility] Surgery

## 2022-11-21 NOTE — Progress Notes (Signed)
ANTICOAGULATION CONSULT NOTE - Initial Consult  Pharmacy Consult for Heparin Indication: mechanical aortic valve, PAF  Allergies  Allergen Reactions   Iohexol Itching   Sulfa Antibiotics Other (See Comments)    unknown    Patient Measurements: Height: 5\' 9"  (175.3 cm) Weight: 51.9 kg (114 lb 6.7 oz) IBW/kg (Calculated) : 70.7 Heparin Dosing Weight: 51.9 kg  Vital Signs: Temp: (P) 98.9 F (37.2 C) (07/06 1657) Temp Source: Axillary (07/06 1657) BP: 154/99 (07/06 1800) Pulse Rate: 100 (07/06 1841)  Labs: Recent Labs    11/19/22 1107 11/19/22 1127 11/19/22 1222 11/19/22 1430 11/20/22 0521 11/20/22 0725 11/21/22 0226  HGB 9.3* 9.9* 8.8*  --  11.6* 11.1*  --   HCT 29.4* 29.0* 26.0*  --  34.0* 34.1*  --   PLT 190  --   --   --   --  199  --   LABPROT 39.3*  --   --  19.0*  --  18.8* 19.7*  INR 4.0*  --   --  1.6*  --  1.6* 1.7*  CREATININE 0.66 0.50*  --   --   --  0.56*  --     Estimated Creatinine Clearance: 53.2 mL/min (A) (by C-G formula based on SCr of 0.56 mg/dL (L)).   Medical History: No past medical history on file.  Medications:  Scheduled:   carbidopa-levodopa  2 tablet Per Tube 6 X Daily   Chlorhexidine Gluconate Cloth  6 each Topical Daily   docusate  100 mg Per Tube BID   heparin injection (subcutaneous)  5,000 Units Subcutaneous Q8H   levothyroxine  125 mcg Per Tube q morning   methocarbamol  500 mg Per Tube Q8H   mouth rinse  15 mL Mouth Rinse Q2H   polyethylene glycol  17 g Per Tube Daily   rOPINIRole  1 mg Per Tube TID   Infusions:   sodium chloride Stopped (11/20/22 1530)   acetaminophen 1,000 mg (11/21/22 1412)   lactated ringers 125 mL/hr at 11/21/22 1537   levETIRAcetam 500 mg (11/21/22 0954)   methocarbamol (ROBAXIN) IV Stopped (11/21/22 0326)    Assessment: 82 y.o. M on warfarin PTA for mechanical aortic valve  (2006) and PAF. Pt presented with a fall and Kcentra given 7/4 to reverse warfarin for IVH/SAH. Now neurosurgery ok to  start heparin, no bolus and will aim for lower half of therapeutic goal. Hgb stable.  Goal of Therapy:  Heparin level 0.3-0.5 units/ml Monitor platelets by anticoagulation protocol: Yes   Plan:  D/c SQ heparin Heparin gtt at 600 units/hr. No bolus. Will f/u 8hr heparin level Daily heparin level and CBC  Christoper Fabian, PharmD, BCPS Please see amion for complete clinical pharmacist phone list 11/21/2022,7:44 PM

## 2022-11-21 NOTE — Procedures (Addendum)
Extubation Procedure Note  Patient Details:   Name: Donald [name] DOB: Jan 29, 1941 MRN: [id number]   Airway Documentation:    Vent end date: 11/21/22 Vent end time: 1132   Evaluation  O2 sats: stable throughout Complications: No apparent complications Patient did tolerate procedure well. Bilateral Breath Sounds: Diminished   No Patient extubated to 4 L [place] per order, patient sat 91%.  Patient not able to say name at this time, no stridor noted. Patient with rhonchi BBS, CPT done at this time. RT will continue to monitor.   Reatha Harps 11/21/2022, 11:50 AM

## 2022-11-22 LAB — HEPARIN LEVEL (UNFRACTIONATED)
Heparin Unfractionated: <0.1 IU/mL — ABNORMAL LOW (ref 0.30–0.70)
Heparin Unfractionated: <0.1 IU/mL — ABNORMAL LOW (ref 0.30–0.70)
Heparin Unfractionated: 0.1 IU/mL — ABNORMAL LOW (ref 0.30–0.70)

## 2022-11-22 LAB — CBC
HCT: 33.3 % — ABNORMAL LOW (ref 39.0–52.0)
Hemoglobin: 10.8 g/dL — ABNORMAL LOW (ref 13.0–17.0)
MCH: 26.7 pg (ref 26.0–34.0)
MCHC: 32.4 g/dL (ref 30.0–36.0)
MCV: 82.4 fL (ref 80.0–100.0)
Platelets: 129 10*3/uL — ABNORMAL LOW (ref 150–400)
RBC: 4.04 MIL/uL — ABNORMAL LOW (ref 4.22–5.81)
RDW: 16.3 % — ABNORMAL HIGH (ref 11.5–15.5)
WBC: 4.6 10*3/uL (ref 4.0–10.5)
nRBC: 0 % (ref 0.0–0.2)

## 2022-11-22 MED ORDER — LIDOCAINE HCL URETHRAL/MUCOSAL 2 % EX GEL
1.0000 | Freq: Once | CUTANEOUS | Status: AC
  Administered 2022-11-22: 1 via URETHRAL
  Filled 2022-11-22: qty 11

## 2022-11-22 NOTE — Progress Notes (Signed)
Patient's foley catheter removed during the day. Patient only voided 100 mL spontaneously since removal. In and out catheter attempted, but resistance was met and catheter could not be advanced. Trauma RN was notified, and order for coude catheter was obtained.

## 2022-11-22 NOTE — Progress Notes (Signed)
..  Trauma Event Note    Reason for Call :  Contacted by primary RN requesting order for NT suction due to copious secretions.  Order entered.   Last imported Vital Signs BP (!) 133/99   Pulse 84   Temp 97.9 F (36.6 C) (Axillary)   Resp 19   Ht 5\' 9"  (1.753 m)   Wt 114 lb 6.7 oz (51.9 kg)   SpO2 97%   BMI 16.90 kg/m   Trending CBC Recent Labs    11/19/22 1107 11/19/22 1127 11/20/22 0521 11/20/22 0725 11/22/22 0359  WBC 3.9*  --   --  7.8 4.6  HGB 9.3*   < > 11.6* 11.1* 10.8*  HCT 29.4*   < > 34.0* 34.1* 33.3*  PLT 190  --   --  199 129*   < > = values in this interval not displayed.    Trending Coag's Recent Labs    11/19/22 1430 11/20/22 0725 11/21/22 0226  INR 1.6* 1.6* 1.7*    Trending BMET Recent Labs    11/19/22 1107 11/19/22 1127 11/19/22 1222 11/20/22 0521 11/20/22 0725  NA 132* 134* 134* 138 136  K 3.8 3.8 3.5 3.3* 3.2*  CL 100 99  --   --  104  CO2 23  --   --   --  23  BUN 16 17  --   --  11  CREATININE 0.66 0.50*  --   --  0.56*  GLUCOSE 87 84  --   --  86      Donald [name]  Trauma Response RN  Please call TRN at [phone] for further assistance.

## 2022-11-22 NOTE — Progress Notes (Signed)
..  Trauma Event Note    Reason for Call :  Contacted by primary RN, bladder scan greater than 350 with unsuccessful attempts at in/out cath. RN reports no spontaneous urination in several hours.  Order for coude entered.   Last imported Vital Signs BP (!) 133/99   Pulse 84   Temp 97.9 F (36.6 C) (Axillary)   Resp 19   Ht 5\' 9"  (1.753 m)   Wt 114 lb 6.7 oz (51.9 kg)   SpO2 97%   BMI 16.90 kg/m   Trending CBC Recent Labs    11/19/22 1107 11/19/22 1127 11/20/22 0521 11/20/22 0725 11/22/22 0359  WBC 3.9*  --   --  7.8 4.6  HGB 9.3*   < > 11.6* 11.1* 10.8*  HCT 29.4*   < > 34.0* 34.1* 33.3*  PLT 190  --   --  199 129*   < > = values in this interval not displayed.    Trending Coag's Recent Labs    11/19/22 1430 11/20/22 0725 11/21/22 0226  INR 1.6* 1.6* 1.7*    Trending BMET Recent Labs    11/19/22 1107 11/19/22 1127 11/19/22 1222 11/20/22 0521 11/20/22 0725  NA 132* 134* 134* 138 136  K 3.8 3.8 3.5 3.3* 3.2*  CL 100 99  --   --  104  CO2 23  --   --   --  23  BUN 16 17  --   --  11  CREATININE 0.66 0.50*  --   --  0.56*  GLUCOSE 87 84  --   --  86      Donald [name] Dee  Trauma Response RN  Please call TRN at [phone] for further assistance.

## 2022-11-22 NOTE — Evaluation (Signed)
Physical Therapy Evaluation Patient Details Name: Donald [name] MRN: [id number] DOB: 1940/08/10 Today's Date: 11/22/2022  History of Present Illness  82yoM on warfarin for a mitral valve who presented to the ED after a ground-level fall.  He was intubated shortly after arrival. Seizure en route; CT head with acute subarachnoid hemorrhage overlying the left temporal and parietal lobes, small hemorrhagic contusions in the left caudate tail and superior frontal gyrus, small volume intraventricular blood. Age indeterminate T7 SP fx; extubated 7/6  Additional PMH-advanced Parkinson's disease, GERD, pacemaker  Clinical Impression   Pt admitted secondary to problem above with deficits below. PTA patient was living at home with his wife providing care for him. She assisted with all his ADLs and typically was with him whenever he walked with his RW. Unfortunately he recently began getting up alone and has had several falls.  Pt currently requires total assist for all mobility due to lethargy. Discharge recommendations difficult to make at this time due to pt's lethargy. Will continue to follow and update discharge plan as indicated.  Anticipate patient may benefit from PT to address problems listed below. Will continue to follow acutely to maximize functional mobility independence and safety.           Assistance Recommended at Discharge Frequent or constant Supervision/Assistance  If plan is discharge home, recommend the following:  Can travel by private vehicle  Other (comment) (currently cannot return home)        Equipment Recommendations Other (comment) (TBA)  Recommendations for Other Services       Functional Status Assessment Patient has had a recent decline in their functional status and demonstrates the ability to make significant improvements in function in a reasonable and predictable amount of time.     Precautions / Restrictions Precautions Precautions: Fall Precaution Comments:  h/o falls with incr recently Restrictions Weight Bearing Restrictions: No      Mobility  Bed Mobility Overal bed mobility: Needs Assistance             General bed mobility comments: up to chair position in ICu bed with no incr alertness    Transfers                   General transfer comment: unable due to lethargy    Ambulation/Gait                  Stairs            Wheelchair Mobility     Tilt Bed    Modified Rankin (Stroke Patients Only)       Balance                                             Pertinent Vitals/Pain Pain Assessment Pain Assessment: Faces Faces Pain Scale: No hurt    Home Living Family/patient expects to be discharged to:: Unsure                        Prior Function Prior Level of Function : Needs assist;History of Falls (last six months)             Mobility Comments: wife reports usually is with him when he is up and using his walker; when he falls it is because he got up alone ADLs Comments: wife assists     Hand  Dominance        Extremity/Trunk Assessment   Upper Extremity Assessment Upper Extremity Assessment: Defer to OT evaluation (PROM to bil UEs in attempt to arouse pt; some resistance with LUE)    Lower Extremity Assessment Lower Extremity Assessment: RLE deficits/detail;LLE deficits/detail RLE Deficits / Details: noted stiffness with PROM (as expected with h/o Parkinson's) with incr time can achieve full knee extension and DF; slight withdrawal to pain LLE Deficits / Details: noted stiffness with PROM (as expected with h/o Parkinson's) with incr time can achieve full knee extension and DF; slight withdrawal to pain    Cervical / Trunk Assessment Cervical / Trunk Assessment: Kyphotic  Communication   Communication: HOH (hears better in left ear per wife; no attempts to verbalize)  Cognition Arousal/Alertness: Lethargic Behavior During Therapy: Flat  affect Overall Cognitive Status: Difficult to assess                                 General Comments: pt lethargic; winces to trapezius squeeze and uses LUE to try to push hand away; not following commands or opening eyes        General Comments General comments (skin integrity, edema, etc.): Wife present. Very appreciative of ROM provided and reports pt tends to be very stiff in his legs. BP stable with supine to chair position ( 116/77 to 128/76)    Exercises     Assessment/Plan    PT Assessment Patient needs continued PT services  PT Problem List Decreased strength;Decreased balance;Decreased mobility;Decreased cognition;Decreased knowledge of use of DME;Decreased safety awareness;Impaired tone       PT Treatment Interventions DME instruction;Gait training;Functional mobility training;Therapeutic activities;Therapeutic exercise;Balance training;Neuromuscular re-education;Cognitive remediation;Patient/family education    PT Goals (Current goals can be found in the Care Plan section)  Acute Rehab PT Goals Patient Stated Goal: pt unable PT Goal Formulation: Patient unable to participate in goal setting Time For Goal Achievement: 12/06/22 Potential to Achieve Goals: Fair    Frequency Min 2X/week     Co-evaluation               AM-PAC PT "6 Clicks" Mobility  Outcome Measure Help needed turning from your back to your side while in a flat bed without using bedrails?: Total Help needed moving from lying on your back to sitting on the side of a flat bed without using bedrails?: Total Help needed moving to and from a bed to a chair (including a wheelchair)?: Total Help needed standing up from a chair using your arms (e.g., wheelchair or bedside chair)?: Total Help needed to walk in hospital room?: Total Help needed climbing 3-5 steps with a railing? : Total 6 Click Score: 6    End of Session   Activity Tolerance: Patient limited by lethargy Patient left:  in bed;with call bell/phone within reach;with family/visitor present;with restraints reapplied (bil mitts) Nurse Communication: Other (comment) (lethargic) PT Visit Diagnosis: Repeated falls (R29.6);Other symptoms and signs involving the nervous system (R29.898)    Time: 1610-9604 PT Time Calculation (min) (ACUTE ONLY): 14 min   Charges:   PT Evaluation $PT Eval Low Complexity: 1 Low   PT General Charges $$ ACUTE PT VISIT: 1 Visit          Jerolyn Center, PT Acute Rehabilitation Services  Office [phone]   Zena Amos 11/22/2022, 9:57 AM

## 2022-11-22 NOTE — Progress Notes (Signed)
ANTICOAGULATION CONSULT NOTE - Initial Consult  Pharmacy Consult for Heparin Indication: mechanical aortic valve, PAF  Allergies  Allergen Reactions   Iohexol Itching   Sulfa Antibiotics Other (See Comments)    unknown    Patient Measurements: Height: 5\' 9"  (175.3 cm) Weight: 51.9 kg (114 lb 6.7 oz) IBW/kg (Calculated) : 70.7 Heparin Dosing Weight: 51.9 kg  Vital Signs: Temp: 98.9 F (37.2 C) (07/07 1202) Temp Source: Axillary (07/07 1202) BP: 130/97 (07/07 0600) Pulse Rate: 117 (07/07 0600)  Labs: Recent Labs    11/19/22 1430 11/20/22 0521 11/20/22 0521 11/20/22 0725 11/21/22 0226 11/22/22 0359 11/22/22 1259  HGB  --  11.6*   < > 11.1*  --  10.8*  --   HCT  --  34.0*  --  34.1*  --  33.3*  --   PLT  --   --   --  199  --  129*  --   LABPROT 19.0*  --   --  18.8* 19.7*  --   --   INR 1.6*  --   --  1.6* 1.7*  --   --   HEPARINUNFRC  --   --   --   --   --  <0.10* <0.10*  CREATININE  --   --   --  0.56*  --   --   --    < > = values in this interval not displayed.     Estimated Creatinine Clearance: 53.2 mL/min (A) (by C-G formula based on SCr of 0.56 mg/dL (L)).   Medical History: No past medical history on file.  Medications:  Scheduled:   carbidopa-levodopa  2 tablet Per Tube 6 X Daily   Chlorhexidine Gluconate Cloth  6 each Topical Daily   docusate  100 mg Per Tube BID   levothyroxine  125 mcg Per Tube q morning   mouth rinse  15 mL Mouth Rinse 4 times per day   polyethylene glycol  17 g Per Tube Daily   rOPINIRole  1 mg Per Tube TID   Infusions:   sodium chloride Stopped (11/20/22 1530)   heparin 750 Units/hr (11/22/22 0600)   lactated ringers 125 mL/hr at 11/22/22 0827   levETIRAcetam 500 mg (11/22/22 1040)    Assessment: 82 y.o. M on warfarin PTA for mechanical aortic valve  (2006) and PAF. Pt presented with a fall and Kcentra given 7/4 to reverse warfarin for IVH/SAH. Now neurosurgery ok to start heparin, no bolus and will aim for lower  half of therapeutic goal. Hgb stable.  HL < 0.10 on heparin 750u/hr --no issues per RN Hgb 10.8, Plt 129   Goal of Therapy:  Heparin level 0.3-0.5 units/ml Monitor platelets by anticoagulation protocol: Yes   Plan:  Increase heparin gtt to 900 units/hr. No bolus. Will f/u 8hr heparin level at 2200 Daily heparin level and CBC   Calton Dach, PharmD Clinical Pharmacist 11/22/2022 1:58 PM

## 2022-11-22 NOTE — Progress Notes (Signed)
ANTICOAGULATION CONSULT NOTE - Follow Up Consult  Pharmacy Consult for heparin Indication:  AVR/PAF in setting of ICH  Labs: Recent Labs    11/19/22 1107 11/19/22 1127 11/19/22 1222 11/19/22 1430 11/20/22 0521 11/20/22 0725 11/21/22 0226 11/22/22 0359  HGB 9.3* 9.9*   < >  --  11.6* 11.1*  --  10.8*  HCT 29.4* 29.0*   < >  --  34.0* 34.1*  --  33.3*  PLT 190  --   --   --   --  199  --  129*  LABPROT 39.3*  --   --  19.0*  --  18.8* 19.7*  --   INR 4.0*  --   --  1.6*  --  1.6* 1.7*  --   HEPARINUNFRC  --   --   --   --   --   --   --  <0.10*  CREATININE 0.66 0.50*  --   --   --  0.56*  --   --    < > = values in this interval not displayed.    Assessment: 82yo male subtherapeutic on heparin with initial cautious dosing for AVR and PAF in setting of ICH; no infusion issues per RN but she does note that there was some bleeding when IV site was d/c'd prior to heparin starting, which resolved easily with pressure, and no bleeding since heparin infusion started.  Goal of Therapy:  Heparin level 0.3-0.5 units/ml   Plan:  Increase heparin infusion by 3 units/kg/hr to 750 units/hr. Check level in 8 hours.   Vernard Gambles, PharmD, BCPS 11/22/2022 5:14 AM

## 2022-11-22 NOTE — Progress Notes (Signed)
**Note Donald-Identified via Obfuscation**   Follow up - Trauma and Critical Care  Patient Details:    Donald [name] is an 82 y.o. male.  Anti-infectives:  Anti-infectives (From admission, onward)    Start     Dose/Rate Route Frequency Ordered Stop   11/19/22 1215  ceFAZolin (ANCEF) IVPB 2g/100 mL premix        2 g 200 mL/hr over 30 Minutes Intravenous  Once 11/19/22 1204 11/19/22 1334       Consults: Treatment Team:  Donald [name], Trauma, Donald [name] Donald Renshaw, Donald [name]   Chief Complaint/Subjective:    Overnight Issues: Moving extremities, no opening eyes  Objective:  Vital signs for last 24 hours: Temp:  [97.9 F (36.6 C)-98.9 F (37.2 C)] 98.3 F (36.8 C) (07/07 0700) Pulse Rate:  [68-117] 117 (07/07 0600) Resp:  [14-29] 29 (07/07 0600) BP: (104-154)/(64-102) 130/97 (07/07 0600) SpO2:  [83 %-100 %] 94 % (07/07 0607) FiO2 (%):  [40 %] 40 % (07/06 1109)  Hemodynamic parameters for last 24 hours:    Intake/Output from previous day: 07/06 0701 - 07/07 0700 In: 3406.3 [I.V.:2906.3; IV Piggyback:500] Out: 1040 [Urine:1040]   Vent settings for last 24 hours: FiO2 (%):  [40 %] 40 %  Physical Exam:  Gen: NAD HEENT: eyes closed Resp: nonlabored Cardiovascular: RRR Abdomen: soft, NT Ext: moves all extremities Neuro: GCS 6   Assessment/Plan:    GLF   IVH, SAH, IPH - NSGY c/s, Donald [name], blossoming IPH on repeat head CT this AM, keppra x7d for sz ppx.  Age indeterminate T7 SP fx - pain control Baseline dementia and Parkinsons - home meds restarted  FEN - tube feeding DVT - SCDs, heparin gtt per NSGY with repeat head CT,  Dispo - ICU    Clinical update provided to wife and daughter at bedside. Discussed clinical improvement and current respiratory state as well as difficulty identifying etiology for not f/c-hearing loss vs TBI. Plan for one way extubation after remainder of family has come to visit and if decline, transition to comfort care.    LOS: 3 days   Additional comments:I have discussed and  reviewed with family members patient's mental status  Critical Care Total Time*: 35 minutes  Donald [name] 11/22/2022  *Care during the described time interval was provided by me and/or other providers on the critical care team.  I have reviewed this patient's available data, including medical history, events of note, physical examination and test results as part of my evaluation.

## 2022-11-22 NOTE — Progress Notes (Signed)
   Providing Compassionate, Quality Care - Together  NEUROSURGERY PROGRESS NOTE   S: No issues overnight.   O: EXAM:  BP (!) 130/97   Pulse (!) 117   Temp 98.3 F (36.8 C) (Axillary)   Resp (!) 29   Ht 5\' 9"  (1.753 m)   Wt 51.9 kg   SpO2 94%   BMI 16.90 kg/m   Eyes closed Pupils equally round reactive to light Very hard of hearing, left hearing aid Moves all extremities to pain purposefully Not following commands  ASSESSMENT:  82 y.o. male with   Small ICH Mechanical heart valve   PLAN: -Family at bedside, answered their questions. -Tolerating extubation, due to mechanical heart valve, heparin started without bolus.  Will obtain CT once at therapeutic levels -I discussed risks benefits and area with the family at bedside about risk of worsening hemorrhage versus also risk of thromboembolic event. -Guarded prognosis     Thank you for allowing me to participate in this patient's care.  Please do not hesitate to call with questions or concerns.   Monia Pouch, DO Neurosurgeon [facility] Cell: [phone]

## 2022-11-22 NOTE — Progress Notes (Signed)
ANTICOAGULATION CONSULT NOTE  Pharmacy Consult for Heparin Indication: mechanical aortic valve, PAF  Allergies  Allergen Reactions   Iohexol Itching   Sulfa Antibiotics Other (See Comments)    unknown    Patient Measurements: Height: 5\' 9"  (175.3 cm) Weight: 51.9 kg (114 lb 6.7 oz) IBW/kg (Calculated) : 70.7 Heparin Dosing Weight: 51.9 kg  Vital Signs: Temp: 99.5 F (37.5 C) (07/07 2000) Temp Source: Axillary (07/07 2000) BP: 123/68 (07/07 2000) Pulse Rate: 82 (07/07 2000)  Labs: Recent Labs    11/20/22 0521 11/20/22 0725 11/21/22 0226 11/22/22 0359 11/22/22 1259 11/22/22 2143  HGB 11.6* 11.1*  --  10.8*  --   --   HCT 34.0* 34.1*  --  33.3*  --   --   PLT  --  199  --  129*  --   --   LABPROT  --  18.8* 19.7*  --   --   --   INR  --  1.6* 1.7*  --   --   --   HEPARINUNFRC  --   --   --  <0.10* <0.10* 0.10*  CREATININE  --  0.56*  --   --   --   --      Estimated Creatinine Clearance: 53.2 mL/min (A) (by C-G formula based on SCr of 0.56 mg/dL (L)).   Assessment: 82 y.o. M on warfarin PTA for mechanical aortic valve  (2006) and PAF. Pt presented with a fall and Kcentra given 7/4 to reverse warfarin for IVH/SAH. Now neurosurgery ok to start heparin, no bolus and will aim for lower half of therapeutic goal. Hgb stable.  Heparin level remains subtherapeutic (0.10) on infusion at 900 units/hr. No issues with line or bleeding reported per RN.  Goal of Therapy:  Heparin level 0.3-0.5 units/ml Monitor platelets by anticoagulation protocol: Yes   Plan:  Increase heparin gtt to 1050 units/hr. F/u 8 hr heparin level  Christoper Fabian, PharmD, BCPS Please see amion for complete clinical pharmacist phone list 11/22/2022 10:11 PM

## 2022-11-22 NOTE — Progress Notes (Signed)
SLP Cancellation Note  Patient Details Name: Donald [name] MRN: [id number] DOB: [birth year]   Cancelled treatment:       Reason Eval/Treat Not Completed: Fatigue/lethargy limiting ability to participate (Pt's case discussed with Rolly Salter, RN who advised that the pt was just given a bath and that even with this stimulation, his alertness has not improved enough for p.o. trials. It was agreed that SLP will plan to follow up on subsequent date, but that RN will contact SLP if the pt's alertness improves later today.)  Jennafer Gladue I. Vear Clock, MS, CCC-SLP Acute Rehabilitation Services Office number [phone]  Scheryl Marten 11/22/2022, 8:49 AM

## 2022-11-23 LAB — HEPARIN LEVEL (UNFRACTIONATED)
Heparin Unfractionated: <0.1 IU/mL — ABNORMAL LOW (ref 0.30–0.70)
Heparin Unfractionated: 0.12 IU/mL — ABNORMAL LOW (ref 0.30–0.70)

## 2022-11-23 LAB — CBC
HCT: 30.1 % — ABNORMAL LOW (ref 39.0–52.0)
Hemoglobin: 10 g/dL — ABNORMAL LOW (ref 13.0–17.0)
MCH: 27.5 pg (ref 26.0–34.0)
MCHC: 33.2 g/dL (ref 30.0–36.0)
MCV: 82.9 fL (ref 80.0–100.0)
Platelets: 131 10*3/uL — ABNORMAL LOW (ref 150–400)
RBC: 3.63 MIL/uL — ABNORMAL LOW (ref 4.22–5.81)
RDW: 16.2 % — ABNORMAL HIGH (ref 11.5–15.5)
WBC: 2.8 10*3/uL — ABNORMAL LOW (ref 4.0–10.5)
nRBC: 0 % (ref 0.0–0.2)

## 2022-11-23 LAB — TRIGLYCERIDES: Triglycerides: 30 mg/dL (ref <150)

## 2022-11-23 MED ORDER — CHLORHEXIDINE GLUCONATE CLOTH 2 % EX PADS
6.0000 | MEDICATED_PAD | Freq: Every day | CUTANEOUS | Status: DC
  Administered 2022-11-23 – 2022-11-24 (×2): 6 via TOPICAL

## 2022-11-23 NOTE — Progress Notes (Signed)
ANTICOAGULATION CONSULT NOTE   Pharmacy Consult for Heparin Indication: mechanical aortic valve, PAF  Allergies  Allergen Reactions   Iohexol Itching   Sulfa Antibiotics Other (See Comments)    unknown    Patient Measurements: Height: 5\' 9"  (175.3 cm) Weight: 51.9 kg (114 lb 6.7 oz) IBW/kg (Calculated) : 70.7 Heparin Dosing Weight: 51.9 kg  Vital Signs: Temp: 99.9 F (37.7 C) (07/08 0745) Temp Source: Axillary (07/08 0745) BP: 123/102 (07/08 1000) Pulse Rate: 90 (07/08 1000)  Labs: Recent Labs    11/21/22 0226 11/22/22 0359 11/22/22 0359 11/22/22 1259 11/22/22 2143 11/23/22 1018  HGB  --  10.8*  --   --   --  10.0*  HCT  --  33.3*  --   --   --  30.1*  PLT  --  129*  --   --   --  131*  LABPROT 19.7*  --   --   --   --   --   INR 1.7*  --   --   --   --   --   HEPARINUNFRC  --  <0.10*   < > <0.10* 0.10* <0.10*   < > = values in this interval not displayed.     Estimated Creatinine Clearance: 53.2 mL/min (A) (by C-G formula based on SCr of 0.56 mg/dL (L)).   Medical History: No past medical history on file.  Medications:  Scheduled:   carbidopa-levodopa  2 tablet Per Tube 6 X Daily   Chlorhexidine Gluconate Cloth  6 each Topical Daily   docusate  100 mg Per Tube BID   levothyroxine  125 mcg Per Tube q morning   mouth rinse  15 mL Mouth Rinse 4 times per day   polyethylene glycol  17 g Per Tube Daily   rOPINIRole  1 mg Per Tube TID   Infusions:   sodium chloride Stopped (11/20/22 1530)   heparin 1,050 Units/hr (11/23/22 1000)   lactated ringers 125 mL/hr at 11/23/22 1000   levETIRAcetam 400 mL/hr at 11/23/22 1000    Assessment: 82 y.o. M on warfarin PTA for mechanical aortic valve  (2006) and PAF. Pt presented with a fall and Kcentra given 7/4 to reverse warfarin for IVH/SAH. Now neurosurgery ok to start heparin, no bolus and will aim for lower half of therapeutic goal. Hgb stable.  Heparin level undetectable s/p rate increase to 1050 units/hr, CBC  stable, no infusion issues  Goal of Therapy:  Heparin level 0.3-0.5 units/ml Monitor platelets by anticoagulation protocol: Yes   Plan:  Increase heparin gtt to 1200 units/hr F/u 8 hour heparin level  Daylene Posey, PharmD, [facility] Clinical Pharmacist ED Pharmacist Phone # [phone] 11/23/2022 12:02 PM

## 2022-11-23 NOTE — Evaluation (Signed)
Occupational Therapy Evaluation Patient Details Name: Donald [name] MRN: [id number] DOB: 03-Sep-1940 Today's Date: 11/23/2022   History of Present Illness 82yoM on warfarin for a mitral valve who presented to the ED after a ground-level fall.  He was intubated shortly after arrival. Seizure en route; CT head with acute subarachnoid hemorrhage overlying the left temporal and parietal lobes, small hemorrhagic contusions in the left caudate tail and superior frontal gyrus, small volume intraventricular blood. Age indeterminate T7 SP fx; extubated 7/6  Additional PMH-advanced Parkinson's disease, GERD, pacemaker   Clinical Impression   PTA patient was living with spouse who assisted with most ADLs and provided supervision for mobility using RW.  Spouse reports he had a recent decline over the past month, but he was still able to feed himself.  Pt lethargic during session, does not follow commands or open eyes during session.  He does attempt to verbalize at end of session, after therapist voiced "Thelma Muck" and repeated this 3x. Overall requires total assist for all ADLs.  PROM to BUEs provided.  Will follow acutely, will likely need post acute rehab but difficult to recommend today due to lethargy.       Recommendations for follow up therapy are one component of a multi-disciplinary discharge planning process, led by the attending physician.  Recommendations may be updated based on patient status, additional functional criteria and insurance authorization.   Assistance Recommended at Discharge Frequent or constant Supervision/Assistance  Patient can return home with the following  (total care)    Functional Status Assessment  Patient has had a recent decline in their functional status and demonstrates the ability to make significant improvements in function in a reasonable and predictable amount of time.  Equipment Recommendations  Other (comment) (tbd)    Recommendations for Other Services        Precautions / Restrictions Precautions Precautions: Fall Precaution Comments: h/o falls with incr recently Restrictions Weight Bearing Restrictions: No      Mobility Bed Mobility                    Transfers                          Balance                                           ADL either performed or assessed with clinical judgement   ADL Overall ADL's : Needs assistance/impaired     Grooming: Bed level;Total assistance;Wash/dry face Grooming Details (indicate cue type and reason): using L hand HOH, pt pushing UE away from face and total assist required                               General ADL Comments: total care     Vision   Additional Comments: eyes closed, does not open during session     Perception     Praxis      Pertinent Vitals/Pain Pain Assessment Pain Assessment: Faces Faces Pain Scale: No hurt Pain Intervention(s): Monitored during session     Hand Dominance Left   Extremity/Trunk Assessment Upper Extremity Assessment Upper Extremity Assessment: LUE deficits/detail;RUE deficits/detail;Difficult to assess due to impaired cognition RUE Deficits / Details: initally stiff, but able to provide PROM with ROM WFL. Mild edema  compared to L UE LUE Deficits / Details: initally stiff, but able to provide PROM with ROM [facility]   Lower Extremity Assessment Lower Extremity Assessment: Defer to PT evaluation   Cervical / Trunk Assessment Cervical / Trunk Assessment: Kyphotic   Communication Communication Communication: HOH (L hearing aide.)   Cognition Arousal/Alertness: Lethargic Behavior During Therapy: Flat affect Overall Cognitive Status: Difficult to assess                                 General Comments: pt lethargic, eyes closed during session.  With attempts to manually open eyes, pt squeeze eyes shut.  He does push away with BUEs during ROM but questionable if purposeful.   Pt not following commands but does verbalize after therapist voicies "Bye frankie", did this 3x.     General Comments  spouse and daughter present, very supportive    Exercises     Shoulder Instructions      Home Living Family/patient expects to be discharged to:: Unsure                                        Prior Functioning/Environment Prior Level of Function : Needs assist;History of Falls (last six months)             Mobility Comments: wife reports usually is with him when he is up and using his walker; when he falls it is because he got up alone ADLs Comments: wife reports recent deline in the last month, needing increased assist for ADLs and she assists him.  She reports he still feeds himself.        OT Problem List: Decreased strength;Decreased range of motion;Decreased activity tolerance;Impaired balance (sitting and/or standing);Impaired vision/perception;Decreased coordination;Decreased cognition;Decreased knowledge of use of DME or AE;Decreased safety awareness;Decreased knowledge of precautions;Cardiopulmonary status limiting activity;Impaired tone;Increased edema      OT Treatment/Interventions: Self-care/ADL training;Therapeutic exercise;Therapeutic activities;Manual therapy;Splinting;Balance training;Patient/family education;Visual/perceptual remediation/compensation;Cognitive remediation/compensation;Neuromuscular education    OT Goals(Current goals can be found in the care plan section) Acute Rehab OT Goals Time For Goal Achievement: 12/07/22 Potential to Achieve Goals: Fair  OT Frequency: Min 2X/week    Co-evaluation              AM-PAC OT "6 Clicks" Daily Activity     Outcome Measure Help from another person eating meals?: Total Help from another person taking care of personal grooming?: Total Help from another person toileting, which includes using toliet, bedpan, or urinal?: Total Help from another person bathing (including  washing, rinsing, drying)?: Total Help from another person to put on and taking off regular upper body clothing?: Total Help from another person to put on and taking off regular lower body clothing?: Total 6 Click Score: 6   End of Session Equipment Utilized During Treatment: Oxygen (Elliott 3L) Nurse Communication: Mobility status  Activity Tolerance: Patient tolerated treatment well Patient left: in bed;with call bell/phone within reach;with bed alarm set;with family/visitor present;with nursing/sitter in room  OT Visit Diagnosis: Other abnormalities of gait and mobility (R26.89);Muscle weakness (generalized) (M62.81);Other symptoms and signs involving cognitive function;History of falling (Z91.81)                Time: 1610-9604 OT Time Calculation (min): 19 min Charges:  OT General Charges $OT Visit: 1 Visit OT Evaluation $OT Eval Moderate Complexity: 1 Mod  Torie Towle  B, OT Acute Rehabilitation Services Office [phone]   Chancy Milroy 11/23/2022, 10:45 AM

## 2022-11-23 NOTE — Progress Notes (Signed)
SLP Cancellation Note  Patient Details Name: Donald [name] MRN: [id number] DOB: 1941-03-12   Cancelled treatment:        Attempted to see pt for swallowing evaluation.  Pt continues with decreased LOA. Spoke briefly with MD.  GOC discussion pending.  SLP will continue to follow for medical readiness for swallow evaluation.   Kerrie Pleasure, MA, CCC-SLP Acute Rehabilitation Services Office: [phone] 11/23/2022, 9:28 AM

## 2022-11-23 NOTE — Plan of Care (Signed)
  Problem: Clinical Measurements: Goal: Diagnostic test results will improve Outcome: Progressing Goal: Cardiovascular complication will be avoided Outcome: Progressing   Problem: Activity: Goal: Risk for activity intolerance will decrease Outcome: Progressing   Problem: Coping: Goal: Level of anxiety will decrease Outcome: Progressing   Problem: Elimination: Goal: Will not experience complications related to bowel motility Outcome: Progressing   Problem: Pain Managment: Goal: General experience of comfort will improve Outcome: Progressing   Problem: Safety: Goal: Ability to remain free from injury will improve Outcome: Progressing   Problem: Skin Integrity: Goal: Risk for impaired skin integrity will decrease Outcome: Progressing   Problem: Education: Goal: Knowledge of General Education information will improve Description: Including pain rating scale, medication(s)/side effects and non-pharmacologic comfort measures Outcome: Not Progressing   Problem: Health Behavior/Discharge Planning: Goal: Ability to manage health-related needs will improve Outcome: Not Progressing   Problem: Clinical Measurements: Goal: Ability to maintain clinical measurements within normal limits will improve Outcome: Not Progressing Goal: Will remain free from infection Outcome: Not Progressing Goal: Respiratory complications will improve Outcome: Not Progressing   Problem: Nutrition: Goal: Adequate nutrition will be maintained Outcome: Not Progressing   Problem: Elimination: Goal: Will not experience complications related to urinary retention Outcome: Not Progressing

## 2022-11-23 NOTE — Progress Notes (Signed)
  NEUROSURGERY PROGRESS NOTE   No issues overnight.   EXAM:  BP 105/64   Pulse 83   Temp 99.3 F (37.4 C) (Axillary)   Resp (!) 25   Ht 5\' 9"  (1.753 m)   Wt 51.9 kg   SpO2 93%   BMI 16.90 kg/m   No eye opening to voice Pupils reactive Moves all extremities purposefully, not following commands    IMPRESSION:  82 y.o. male s/p fall on coumadin for mech valve and baseline Parkinson's dementia with TBI. Has not had significant improvement in mental status and family indicates given his baseline condition he likely would not want aggressive medical care.  PLAN: - Can cont current supportive therapies - Will get palliative care consult   Lisbeth Renshaw, MD [facility]

## 2022-11-23 NOTE — Progress Notes (Signed)
Trauma/Critical Care Follow Up Note  Subjective:    Overnight Issues:   Objective:  Vital signs for last 24 hours: Temp:  [98.4 F (36.9 C)-100.7 F (38.2 C)] 99.3 F (37.4 C) (07/08 1100) Pulse Rate:  [70-95] 88 (07/08 1300) Resp:  [12-28] 12 (07/08 1300) BP: (105-136)/(57-102) 107/60 (07/08 1300) SpO2:  [91 %-100 %] 94 % (07/08 1300)  Hemodynamic parameters for last 24 hours:    Intake/Output from previous day: 07/07 0701 - 07/08 0700 In: 3332.3 [I.V.:3132.3; IV Piggyback:200] Out: 1275 [Urine:1275]  Intake/Output this shift: Total I/O In: 910.5 [I.V.:810.7; IV Piggyback:99.8] Out: 100 [Urine:100]  Vent settings for last 24 hours:    Physical Exam:  Gen: comfortable, no distress Neuro: not following commands HEENT: PERRL Neck: supple CV: RRR Pulm: unlabored breathing on Gaffney Abd: soft, NT    GU: urine clear and yellow, +Foley Extr: wwp, no edema  Results for orders placed or performed during the hospital encounter of 11/19/22 (from the past 24 hour(s))  Heparin level (unfractionated)     Status: Abnormal   Collection Time: 11/22/22  9:43 PM  Result Value Ref Range   Heparin Unfractionated 0.10 (L) 0.30 - 0.70 IU/mL  Triglycerides     Status: None   Collection Time: 11/23/22 10:18 AM  Result Value Ref Range   Triglycerides 30 <150 mg/dL  CBC     Status: Abnormal   Collection Time: 11/23/22 10:18 AM  Result Value Ref Range   WBC 2.8 (L) 4.0 - 10.5 K/uL   RBC 3.63 (L) 4.22 - 5.81 MIL/uL   Hemoglobin 10.0 (L) 13.0 - 17.0 g/dL   HCT 09.8 (L) 11.9 - 14.7 %   MCV 82.9 80.0 - 100.0 fL   MCH 27.5 26.0 - 34.0 pg   MCHC 33.2 30.0 - 36.0 g/dL   RDW 82.9 (H) 56.2 - 13.0 %   Platelets 131 (L) 150 - 400 K/uL   nRBC 0.0 0.0 - 0.2 %  Heparin level (unfractionated)     Status: Abnormal   Collection Time: 11/23/22 10:18 AM  Result Value Ref Range   Heparin Unfractionated <0.10 (L) 0.30 - 0.70 IU/mL    Assessment & Plan: The plan of care was discussed with the  bedside nurse for the day, Rolly Salter, who is in agreement with this plan and no additional concerns were raised.   Present on Admission:  Fall, initial encounter    LOS: 4 days   Additional comments:I reviewed the patient's new clinical lab test results.   and I reviewed the patients new imaging test results.    GLF   IVH, SAH, IPH - NSGY c/s, Dr. Conchita Paris, blossoming IPH on repeat head CT this AM, keppra x7d for sz ppx.  Age indeterminate T7 SP fx - pain control Baseline dementia and Parkinsons - home meds restarted  FEN - d/c NGT DVT - SCDs, heparin gtt per NSGY with repeat head CT,  Dispo - ICU    Clinical update provided to wife and daughter at bedside. Discussed clinical stability and lack of further improvement. Further explored goals of care and discussed hospice care. Family is agreeable to hospice and will begin dispo planning with TOC. Palliative consulted to assist.  Critical Care Total Time: 45 minutes  Diamantina Monks, MD Trauma & General Surgery Please use [email or web address] to contact on call provider  11/23/2022  *Care during the described time interval was provided by me. I have reviewed this patient's available data, including medical history, events of note,  physical examination and test results as part of my evaluation.

## 2022-11-23 NOTE — Progress Notes (Signed)
ANTICOAGULATION CONSULT NOTE   Pharmacy Consult for Heparin Indication: mechanical aortic valve, PAF  Allergies  Allergen Reactions   Iohexol Itching   Sulfa Antibiotics Other (See Comments)    unknown    Patient Measurements: Height: 5\' 9"  (175.3 cm) Weight: 51.9 kg (114 lb 6.7 oz) IBW/kg (Calculated) : 70.7 Heparin Dosing Weight: 51.9 kg  Vital Signs: Temp: 99.3 F (37.4 C) (07/08 1100) Temp Source: Axillary (07/08 1100) BP: 127/87 (07/08 2000) Pulse Rate: 83 (07/08 2000)  Labs: Recent Labs    11/21/22 0226 11/22/22 0359 11/22/22 1259 11/22/22 2143 11/23/22 1018 11/23/22 2101  HGB  --  10.8*  --   --  10.0*  --   HCT  --  33.3*  --   --  30.1*  --   PLT  --  129*  --   --  131*  --   LABPROT 19.7*  --   --   --   --   --   INR 1.7*  --   --   --   --   --   HEPARINUNFRC  --  <0.10*   < > 0.10* <0.10* 0.12*   < > = values in this interval not displayed.     Estimated Creatinine Clearance: 53.2 mL/min (A) (by C-G formula based on SCr of 0.56 mg/dL (L)).   Assessment: 82 y.o. M on warfarin PTA for mechanical aortic valve  (2006) and PAF. Pt presented with a fall and Kcentra given 7/4 to reverse warfarin for IVH/SAH. Now neurosurgery ok to start heparin, no bolus and will aim for lower half of therapeutic goal. Hgb stable.  Heparin level remains subtherapeutic (0.12) on infusion at 1200 units/hr. No issues with line or bleeding reported per RN.  Goal of Therapy:  Heparin level 0.3-0.5 units/ml Monitor platelets by anticoagulation protocol: Yes   Plan:  Increase heparin gtt to 1350 units/hr F/u 8 hour heparin level  Christoper Fabian, PharmD, BCPS Please see amion for complete clinical pharmacist phone list 11/23/2022 9:48 PM

## 2022-11-24 DIAGNOSIS — R0609 Other forms of dyspnea: Secondary | ICD-10-CM

## 2022-11-24 DIAGNOSIS — F419 Anxiety disorder, unspecified: Secondary | ICD-10-CM

## 2022-11-24 DIAGNOSIS — S066X1A Traumatic subarachnoid hemorrhage with loss of consciousness of 30 minutes or less, initial encounter: Secondary | ICD-10-CM

## 2022-11-24 DIAGNOSIS — Z515 Encounter for palliative care: Secondary | ICD-10-CM

## 2022-11-24 DIAGNOSIS — R451 Restlessness and agitation: Secondary | ICD-10-CM

## 2022-11-24 LAB — HEPARIN LEVEL (UNFRACTIONATED): Heparin Unfractionated: 0.16 IU/mL — ABNORMAL LOW (ref 0.30–0.70)

## 2022-11-24 LAB — CBC
HCT: 27.8 % — ABNORMAL LOW (ref 39.0–52.0)
Hemoglobin: 9.2 g/dL — ABNORMAL LOW (ref 13.0–17.0)
MCH: 26.8 pg (ref 26.0–34.0)
MCHC: 33.1 g/dL (ref 30.0–36.0)
MCV: 81 fL (ref 80.0–100.0)
Platelets: 142 10*3/uL — ABNORMAL LOW (ref 150–400)
RBC: 3.43 MIL/uL — ABNORMAL LOW (ref 4.22–5.81)
RDW: 16.2 % — ABNORMAL HIGH (ref 11.5–15.5)
WBC: 3.4 10*3/uL — ABNORMAL LOW (ref 4.0–10.5)
nRBC: 0 % (ref 0.0–0.2)

## 2022-11-24 MED ORDER — LORAZEPAM 2 MG/ML IJ SOLN
1.0000 mg | INTRAMUSCULAR | Status: DC | PRN
  Administered 2022-11-24: 1 mg via INTRAVENOUS
  Filled 2022-11-24: qty 1

## 2022-11-24 MED ORDER — GLYCOPYRROLATE 0.2 MG/ML IJ SOLN
0.4000 mg | Freq: Three times a day (TID) | INTRAMUSCULAR | Status: DC
  Administered 2022-11-24: 0.4 mg via INTRAVENOUS
  Filled 2022-11-24: qty 2

## 2022-11-24 NOTE — Discharge Summary (Signed)
    Patient ID: KARI LATHER [id number] 08-04-1940 82 y.o.  Admit date: 11/19/2022 Discharge date: 11/24/2022  Admitting Diagnosis: GLF Head laceration SAH with seizure  Discharge Diagnosis Patient Active Problem List   Diagnosis Date Noted   Fall, initial encounter 11/19/2022  GLF IVH, SAH, IPH  Age indeterminate T7 SP fx  Baseline dementia and Parkinsons  Consultants Dr. Conchita Paris - NSGY Palliative care   Reason for Admission: Jesean Wichert is an 10 y/o M who presented via EMS as a level 1 trauma after a GLF at home. History reported by EDP and bedside staff as patient is intubated upon my arrival. Per report his wife heard a thud and then found him face down on the floor with a head laceration. She called EMS and en route to the hospital he had a seizure and was given 2.5 of versed, after which time he became unresponsive and was manually ventilated in the field. Upon arrival to the ED he remained GCS 3 and the decision was made to intubate. Reportedly he did make purposeful movement - grabbing towards ETT. Systolic pressure initial 80's, came up to 98 mmhg with fluids.   Procedures none  Hospital Course:  The patient was admitted and intubated due to low GCS.  He was found to have a TBI.  His warfarin was held.  He was ultimately extubated but mental status remained depressed.  Family wished for patient to not be reintubated if that were necessary.  Initially heparin was started for his mechanical valve.  The patient did not really progress and remained somnolent.  Discussions were had with the family who elected for comfort/hospice type approach given his underlying dementia and Parkinson's disease.  PCM was consulted and arrangements were made for DC to residential hospice.     Allergies as of 11/24/2022       Reactions   Iohexol Itching   Sulfa Antibiotics Other (See Comments)   unknown        Medication List     STOP taking these medications    amoxicillin 500 MG  capsule Commonly known as: AMOXIL   aspirin EC 81 MG tablet   atorvastatin 20 MG tablet Commonly known as: LIPITOR   carbidopa-levodopa 25-100 MG tablet Commonly known as: SINEMET IR   carbidopa-levodopa 50-200 MG tablet Commonly known as: SINEMET CR   esomeprazole 40 MG capsule Commonly known as: NEXIUM   levothyroxine 125 MCG tablet Commonly known as: SYNTHROID   midodrine 10 MG tablet Commonly known as: PROAMATINE   nitroGLYCERIN 0.4 MG SL tablet Commonly known as: NITROSTAT   rOPINIRole 2 MG tablet Commonly known as: REQUIP   warfarin 5 MG tablet Commonly known as: COUMADIN           Signed: Barnetta Chapel, [facility] 11/24/2022, 2:56 PM Please see Amion for pager number during day hours 7:00am-4:30pm, 7-11:30am on Weekends

## 2022-11-24 NOTE — Progress Notes (Signed)
   Trauma/Critical Care Follow Up Note  Subjective:    Overnight Issues: comfortable, sleeping.  Family at bedside with palliative care team  Objective:  Vital signs for last 24 hours: Temp:  [97.9 F (36.6 C)-99.3 F (37.4 C)] 97.9 F (36.6 C) (07/09 0807) Pulse Rate:  [73-95] 86 (07/09 0807) Resp:  [12-26] 20 (07/09 0807) BP: (105-153)/(58-94) 141/92 (07/09 0807) SpO2:  [93 %-100 %] 100 % (07/09 0807)  Hemodynamic parameters for last 24 hours:    Intake/Output from previous day: 07/08 0701 - 07/09 0700 In: 2495.3 [I.V.:2295.4; IV Piggyback:199.8] Out: 601 [Urine:600; Stool:1]  Intake/Output this shift: No intake/output data recorded.  Vent settings for last 24 hours:    Physical Exam:  Gen: comfortable, no distress Head: pon on left side stable and dressed Neuro: sleeping CV: RRR Pulm: unlabored breathing    Results for orders placed or performed during the hospital encounter of 11/19/22 (from the past 24 hour(s))  Heparin level (unfractionated)     Status: Abnormal   Collection Time: 11/23/22  9:01 PM  Result Value Ref Range   Heparin Unfractionated 0.12 (L) 0.30 - 0.70 IU/mL  CBC     Status: Abnormal   Collection Time: 11/24/22  4:51 AM  Result Value Ref Range   WBC 3.4 (L) 4.0 - 10.5 K/uL   RBC 3.43 (L) 4.22 - 5.81 MIL/uL   Hemoglobin 9.2 (L) 13.0 - 17.0 g/dL   HCT 16.1 (L) 09.6 - 04.5 %   MCV 81.0 80.0 - 100.0 fL   MCH 26.8 26.0 - 34.0 pg   MCHC 33.1 30.0 - 36.0 g/dL   RDW 40.9 (H) 81.1 - 91.4 %   Platelets 142 (L) 150 - 400 K/uL   nRBC 0.0 0.0 - 0.2 %  Heparin level (unfractionated)     Status: Abnormal   Collection Time: 11/24/22  6:50 AM  Result Value Ref Range   Heparin Unfractionated 0.16 (L) 0.30 - 0.70 IU/mL    Assessment & Plan: The plan of care was discussed with the bedside nurse for the day, Rolly Salter, who is in agreement with this plan and no additional concerns were raised.   Present on Admission:  Fall, initial encounter    LOS: 5  days   Additional comments:I reviewed the patient's new clinical lab test results.   and I reviewed the patients new imaging test results.    GLF   IVH, SAH, IPH - NSGY c/s, Dr. Conchita Paris, blossoming IPH on repeat head CT this AM, keppra x7d for sz ppx.  Age indeterminate T7 SP fx - pain control Baseline dementia and Parkinsons - home meds restarted  FEN - comfort DVT - SCDs, heparin gtt per NSGY with repeat head CT, suspect this will be DC today given transition to comfort Dispo - 6N for comfort, residential hospice placement being arranged by palliative care.  Appreciate their assistance.  Wife and other family member at the bedside today   Letha Cape, PA-C Trauma & General Surgery Please use [email or web address] to contact on call provider  11/24/2022  *Care during the described time interval was provided by me. I have reviewed this patient's available data, including medical history, events of note, physical examination and test results as part of my evaluation.

## 2022-11-24 NOTE — Progress Notes (Addendum)
Civil engineer, contracting [facility]) Hospital Liaison Note  Referral made for patient/family interest in [facility]. Chart under review by [facility] physician.   Bed offered and accepted for transfer today to [facility] Unit RN please call report to [phone] prior to patient leaving the unit. Please send signed DNR and paperwork with patient.   Please leave  all IV access and ports in place.   Please call with any questions or concerns. Thank you  Dionicio Stall, [facility] Liaison  [phone]

## 2022-11-24 NOTE — Consult Note (Signed)
Consultation Note Date: 11/24/2022   Patient Name: Donald [name]  DOB: 04-Aug-1940  MRN: [id number]  Age / Sex: 82 y.o., male  PCP: Tisovec, Adelfa Koh, MD Referring Physician: Md, Trauma, MD  Reason for Consultation: Establishing goals of care  HPI/Patient Profile: 82 y.o. male  admitted on 11/19/2022 who presented to the ED after ground-level fall.  He was intubated shortly after arrival.  Seizure and route.  Head CT significant for subarachnoid hemorrhage overlying the left temporal and parietal lobes, small hemorrhagic contusions in the left caudate tail and superior frontal gyrus  PMH-advanced Parkinson's disease, GERD, pacemaker  Family face treatment option decisions, advanced directive decisions and anticipatory care needs.  Clinical Assessment and Goals of Care:  This NP Donald [name] reviewed medical records, received report from team, assessed the patient and then meet at the patient's bedside along with his wife, Donald [name] and daughter/and needed by telephone to discuss diagnosis, prognosis, GOC, EOL wishes disposition and options.   Concept of Palliative Care was introduced as specialized medical care for people and their families living with serious illness.  If focuses on providing relief from the symptoms and stress of a serious illness.  The goal is to improve quality of life for both the patient and the family.  Values and goals of care important to patient and family were attempted to be elicited.  Created space and opportunity for family to explore thoughts and feelings regarding current medical situation.  All family members present verbalize an understanding of the patient's limited prognosis.    Their main focus is comfort and dignity at end-of-life, as he wished allowing for natural death  They recognize the patient was failing to thrive and had significant physical, functional and  cognitive decline for the past year.  He had very poor oral intake prior to this hospitalization, he was dependent for most ADLs.  His wife was his main caregiver  All family members expressed their love and appreciation for Donald [name] room.  They speak to his love of being in the outdoors, he loves to take care of the lawn and his garden.  He was along time employee of [employer]      A  discussion was had today regarding advanced directives.  Concepts specific to code status, artifical feeding and hydration, continued IV antibiotics and rehospitalization was had.  The difference between a aggressive medical intervention path  and a palliative comfort care path for this patient at this time was had.     MOST form introduced and Hard Choices booklet left for review.    Natural trajectory and expectations at EOL were discussed.  Questions and concerns addressed.  Patient  encouraged to call with questions or concerns.     PMT will continue to support holistically.           Family will bring in advance care planning documents for scanning.  Patient's wife next of kin is main decision maker      SUMMARY OF RECOMMENDATIONS    Code  Status/Advance Care Planning: DNR Full comfort path, no further life-prolonging measures, Allow for natural death   Symptom Management:  Pain/dyspnea: Morphine 1 to 2 mg IV every 2 hours as needed Agitation: Ativan 1 mg IV every 4 hours as needed Terminal secretions: Robinul 0.4 mg IV 3 times daily  Palliative Prophylaxis:  Eye Care, Frequent Pain Assessment, and Oral Care  Additional Recommendations (Limitations, Scope, Preferences): Full Comfort Care  Psycho-social/Spiritual:  Desire for further Chaplaincy support:no-declined Additional Recommendations: Education on Hospice  Prognosis:  < 2 weeks  Discharge Planning:  Family is interested in residential hospice for end-of-life care at [facility] facility      Primary  Diagnoses: Present on Admission:  Fall, initial encounter   I have reviewed the medical record, interviewed the patient and family, and examined the patient. The following aspects are pertinent.  No past medical history on file. Social History   Socioeconomic History   Marital status: Married    Spouse name: Not on file   Number of children: Not on file   Years of education: Not on file   Highest education level: Not on file  Occupational History   Not on file  Tobacco Use   Smoking status: Not on file   Smokeless tobacco: Not on file  Substance and Sexual Activity   Alcohol use: Not on file   Drug use: Not on file   Sexual activity: Not on file  Other Topics Concern   Not on file  Social History Narrative   Not on file   Social Determinants of Health   Financial Resource Strain: Not on file  Food Insecurity: Not on file  Transportation Needs: Not on file  Physical Activity: Not on file  Stress: Not on file  Social Connections: Not on file   No family history on file. Scheduled Meds:  carbidopa-levodopa  2 tablet Per Tube 6 X Daily   Chlorhexidine Gluconate Cloth  6 each Topical Daily   docusate  100 mg Per Tube BID   levothyroxine  125 mcg Per Tube q morning   mouth rinse  15 mL Mouth Rinse 4 times per day   polyethylene glycol  17 g Per Tube Daily   rOPINIRole  1 mg Per Tube TID   Continuous Infusions:  sodium chloride Stopped (11/20/22 1530)   heparin 1,350 Units/hr (11/24/22 0138)   lactated ringers 125 mL/hr at 11/24/22 0134   levETIRAcetam Stopped (11/23/22 2240)   PRN Meds:.hydrALAZINE, ipratropium-albuterol, metoprolol tartrate, midazolam, morphine injection, ondansetron **OR** ondansetron (ZOFRAN) IV, mouth rinse, oxyCODONE, polyethylene glycol Medications Prior to Admission:  Prior to Admission medications   Medication Sig Start Date End Date Taking? Authorizing Provider  amoxicillin (AMOXIL) 500 MG capsule Take 2,000 mg by mouth as needed (Take 4  capsules prior to dental appts.).   Yes [provider]  aspirin EC 81 MG tablet Take 81 mg by mouth daily. Swallow whole.   Yes [provider]  atorvastatin (LIPITOR) 20 MG tablet Take 20 mg by mouth at bedtime. 08/26/22  Yes [provider]  carbidopa-levodopa (SINEMET CR) 50-200 MG tablet Take 1 tablet by mouth at bedtime. 08/26/22  Yes [provider]  carbidopa-levodopa (SINEMET IR) 25-100 MG tablet Take 2 tablets by mouth See admin instructions. 2 tablets six times a day at 7am, 9:30am, 12pm, 2:30pm, 5pm, and 7pm. 08/26/22  Yes [provider]  esomeprazole (NEXIUM) 40 MG capsule Take 40 mg by mouth daily. 08/26/22  Yes [provider]  levothyroxine (SYNTHROID) 125 MCG tablet Take 125 mcg by mouth every morning. 08/26/22  Yes [provider]  midodrine (PROAMATINE) 10 MG tablet Take 5-10 mg by mouth See admin instructions. 5mg  in the morning, 10mg  at noon, and 10mg  in the evening 11/04/22  Yes [provider]  nitroGLYCERIN (NITROSTAT) 0.4 MG SL tablet Place 0.4 mg under the tongue every 5 (five) minutes as needed for chest pain. Place 1 tablet under tongue every 5 minutes for 3 doses as needed for chest pain.   Yes [provider]  rOPINIRole (REQUIP) 2 MG tablet Take 2 mg by mouth in the morning, at noon, and at bedtime. 08/26/22  Yes [provider]  warfarin (COUMADIN) 5 MG tablet Take 2.5-5 mg by mouth See admin instructions. 2.5mg  daily on Sunday's, Tuesday's, and Thursday's, 5mg  daily on Monday's, Wednesday's, Friday's, and Saturday's. 08/26/22  Yes [provider]   Allergies  Allergen Reactions   Iohexol Itching   Sulfa Antibiotics Other (See Comments)    unknown   Review of Systems  Unable to perform ROS: Mental status change    Physical Exam Constitutional:      Appearance: He is ill-appearing.     Comments: Minimally responsive to gentle touch and verbal stimuli  HENT:     Head:      Comments: -Audible throat secretions, weak cough Cardiovascular:     Rate and Rhythm: Normal rate.  Pulmonary:     Effort: Pulmonary effort is normal.  Musculoskeletal:     Comments: Generalized muscle atrophy  Skin:    General: Skin is warm and dry.     Vital Signs: BP (!) 141/92 (BP Location: Right Arm)   Pulse 86   Temp 97.9 F (36.6 C) (Oral)   Resp 20   Ht 5\' 9"  (1.753 m)   Wt 51.9 kg   SpO2 100%   BMI 16.90 kg/m  Pain Scale: Faces       SpO2: SpO2: 100 % O2 Device:SpO2: 100 % O2 Flow Rate: .O2 Flow Rate (L/min): 2 L/min  IO: Intake/output summary:  Intake/Output Summary (Last 24 hours) at 11/24/2022 0941 Last data filed at 11/24/2022 0100 Gross per 24 hour  Intake 2224.26 ml  Output 601 ml  Net 1623.26 ml    LBM: Last BM Date : 11/24/22 Baseline Weight: Weight: 49.9 kg (2-3 weeks ago) Most recent weight: Weight: 51.9 kg     Palliative Assessment/Data:  10 %     Time:  90 minutes--- discussed with attending team and transition of care team   Signed by: Donald Creed, NP   Please contact Palliative Medicine Team phone at [phone] for questions and concerns.  For individual provider: See Loretha Stapler

## 2022-11-24 NOTE — Progress Notes (Signed)
ANTICOAGULATION CONSULT NOTE- Follow Up  Pharmacy Consult for Heparin Indication: mechanical aortic valve, PAF  Allergies  Allergen Reactions   Iohexol Itching   Sulfa Antibiotics Other (See Comments)    unknown    Patient Measurements: Height: 5\' 9"  (175.3 cm) Weight: 51.9 kg (114 lb 6.7 oz) IBW/kg (Calculated) : 70.7 Heparin Dosing Weight: 51.9 kg  Vital Signs: Temp: 98.4 F (36.9 C) (07/09 0544) Temp Source: Oral (07/09 0544) BP: 136/90 (07/09 0544) Pulse Rate: 76 (07/09 0544)  Labs: Recent Labs    11/22/22 0359 11/22/22 1259 11/23/22 1018 11/23/22 2101 11/24/22 0451 11/24/22 0650  HGB 10.8*  --  10.0*  --  9.2*  --   HCT 33.3*  --  30.1*  --  27.8*  --   PLT 129*  --  131*  --  142*  --   HEPARINUNFRC <0.10*   < > <0.10* 0.12*  --  0.16*   < > = values in this interval not displayed.     Estimated Creatinine Clearance: 53.2 mL/min (A) (by C-G formula based on SCr of 0.56 mg/dL (L)).   Medical History: No past medical history on file.  Medications:  Scheduled:   carbidopa-levodopa  2 tablet Per Tube 6 X Daily   Chlorhexidine Gluconate Cloth  6 each Topical Daily   docusate  100 mg Per Tube BID   levothyroxine  125 mcg Per Tube q morning   mouth rinse  15 mL Mouth Rinse 4 times per day   polyethylene glycol  17 g Per Tube Daily   rOPINIRole  1 mg Per Tube TID   Infusions:   sodium chloride Stopped (11/20/22 1530)   heparin 1,350 Units/hr (11/24/22 0138)   lactated ringers 125 mL/hr at 11/24/22 0134   levETIRAcetam Stopped (11/23/22 2240)    Assessment: 82 y.o. M on warfarin PTA for mechanical aortic valve (2006) and PAF. Pt presented with a fall and Kcentra given 7/4 to reverse warfarin for IVH/SAH. CT on 7/5 showed blooming shear-type hemorrhages especially in the medial right temporal occipital convexity. Now neurosurgery ok to start heparin, no bolus and will aim for lower half of therapeutic goal.   Heparin level 0.16 with infusion running at  1350 units/hr. Remains subtherapeutic after recent dose increase. Hgb (9.2) and PLTs (142, low stable) remain stable. Confirmed heparin infusion is currently running with no issues, no obvious signs of bleeding at bedside.  Goals of care discussion in progress, patient may transition to hospice.  Goal of Therapy:  Heparin level 0.3-0.5 units/ml Monitor platelets by anticoagulation protocol: Yes   Plan:  Increase heparin infusion to 1450 units/hr F/u 8 hour heparin level Daily CBC and heparin level Monitor for signs/symptoms of bleeding F/u CT head and GOC  Romie Minus, PharmD PGY1 Pharmacy Resident  Please check AMION for all [facility] Pharmacy phone numbers After 10:00 PM, call Main Pharmacy [phone]

## 2022-11-27 ENCOUNTER — Ambulatory Visit: Payer: PPO

## 2022-12-11 ENCOUNTER — Ambulatory Visit: Payer: PPO | Admitting: Neurology

## 2022-12-17 DEATH — deceased

## 2023-02-11 ENCOUNTER — Ambulatory Visit: Payer: PPO | Admitting: Podiatry

## 2023-04-13 ENCOUNTER — Ambulatory Visit: Payer: PPO | Admitting: Neurology

## 2023-04-22 ENCOUNTER — Ambulatory Visit: Payer: PPO | Admitting: Cardiology

## 2023-08-26 ENCOUNTER — Ambulatory Visit: Payer: PPO | Admitting: Occupational Therapy

## 2023-08-26 ENCOUNTER — Ambulatory Visit: Payer: PPO

## 2023-08-26 ENCOUNTER — Ambulatory Visit: Payer: PPO | Admitting: Physical Therapy
